# Patient Record
Sex: Female | Born: 1945 | Race: White | Hispanic: No | Marital: Single | Smoking: Never smoker
Health system: Southern US, Community
[De-identification: ages and names within clinical notes are randomized; demographics above are authoritative.]

## PROBLEM LIST (undated history)

## (undated) DIAGNOSIS — Z923 Personal history of irradiation: Secondary | ICD-10-CM

## (undated) DIAGNOSIS — T4145XA Adverse effect of unspecified anesthetic, initial encounter: Secondary | ICD-10-CM

## (undated) DIAGNOSIS — E039 Hypothyroidism, unspecified: Secondary | ICD-10-CM

## (undated) DIAGNOSIS — Z8719 Personal history of other diseases of the digestive system: Secondary | ICD-10-CM

## (undated) DIAGNOSIS — R51 Headache: Secondary | ICD-10-CM

## (undated) DIAGNOSIS — F329 Major depressive disorder, single episode, unspecified: Secondary | ICD-10-CM

## (undated) DIAGNOSIS — Z7901 Long term (current) use of anticoagulants: Secondary | ICD-10-CM

## (undated) DIAGNOSIS — J45909 Unspecified asthma, uncomplicated: Secondary | ICD-10-CM

## (undated) DIAGNOSIS — F419 Anxiety disorder, unspecified: Secondary | ICD-10-CM

## (undated) DIAGNOSIS — Z862 Personal history of diseases of the blood and blood-forming organs and certain disorders involving the immune mechanism: Secondary | ICD-10-CM

## (undated) DIAGNOSIS — M1990 Unspecified osteoarthritis, unspecified site: Secondary | ICD-10-CM

## (undated) DIAGNOSIS — I639 Cerebral infarction, unspecified: Secondary | ICD-10-CM

## (undated) DIAGNOSIS — Z86718 Personal history of other venous thrombosis and embolism: Secondary | ICD-10-CM

## (undated) DIAGNOSIS — J189 Pneumonia, unspecified organism: Secondary | ICD-10-CM

## (undated) DIAGNOSIS — E785 Hyperlipidemia, unspecified: Secondary | ICD-10-CM

## (undated) DIAGNOSIS — K759 Inflammatory liver disease, unspecified: Secondary | ICD-10-CM

## (undated) DIAGNOSIS — M797 Fibromyalgia: Secondary | ICD-10-CM

## (undated) DIAGNOSIS — T8859XA Other complications of anesthesia, initial encounter: Secondary | ICD-10-CM

## (undated) DIAGNOSIS — J9819 Other pulmonary collapse: Secondary | ICD-10-CM

## (undated) DIAGNOSIS — F32A Depression, unspecified: Secondary | ICD-10-CM

## (undated) DIAGNOSIS — G709 Myoneural disorder, unspecified: Secondary | ICD-10-CM

## (undated) DIAGNOSIS — I10 Essential (primary) hypertension: Secondary | ICD-10-CM

## (undated) DIAGNOSIS — M7989 Other specified soft tissue disorders: Principal | ICD-10-CM

## (undated) DIAGNOSIS — K219 Gastro-esophageal reflux disease without esophagitis: Secondary | ICD-10-CM

## (undated) DIAGNOSIS — C50919 Malignant neoplasm of unspecified site of unspecified female breast: Secondary | ICD-10-CM

## (undated) DIAGNOSIS — G030 Nonpyogenic meningitis: Secondary | ICD-10-CM

## (undated) DIAGNOSIS — I742 Embolism and thrombosis of arteries of the upper extremities: Secondary | ICD-10-CM

## (undated) DIAGNOSIS — Z8673 Personal history of transient ischemic attack (TIA), and cerebral infarction without residual deficits: Secondary | ICD-10-CM

## (undated) HISTORY — DX: Personal history of transient ischemic attack (TIA), and cerebral infarction without residual deficits: Z86.73

## (undated) HISTORY — DX: Nonpyogenic meningitis: G03.0

## (undated) HISTORY — PX: ABDOMINAL HYSTERECTOMY: SHX81

## (undated) HISTORY — DX: Embolism and thrombosis of arteries of the upper extremities: I74.2

## (undated) HISTORY — PX: ESOPHAGOGASTRODUODENOSCOPY ENDOSCOPY: SHX5814

## (undated) HISTORY — PX: BREAST SURGERY: SHX581

## (undated) HISTORY — DX: Hypothyroidism, unspecified: E03.9

## (undated) HISTORY — DX: Malignant neoplasm of unspecified site of unspecified female breast: C50.919

## (undated) HISTORY — DX: Personal history of other venous thrombosis and embolism: Z86.718

## (undated) HISTORY — PX: TONSILLECTOMY: SUR1361

## (undated) HISTORY — PX: BUNIONECTOMY: SHX129

## (undated) HISTORY — DX: Personal history of diseases of the blood and blood-forming organs and certain disorders involving the immune mechanism: Z86.2

## (undated) HISTORY — DX: Long term (current) use of anticoagulants: Z79.01

## (undated) HISTORY — DX: Myoneural disorder, unspecified: G70.9

## (undated) HISTORY — PX: KNEE ARTHROSCOPY: SHX127

## (undated) HISTORY — DX: Unspecified osteoarthritis, unspecified site: M19.90

## (undated) HISTORY — DX: Other specified soft tissue disorders: M79.89

## (undated) HISTORY — PX: THYROIDECTOMY: SHX17

## (undated) HISTORY — DX: Hyperlipidemia, unspecified: E78.5

---

## 1978-07-26 HISTORY — PX: RIGHT OOPHORECTOMY: SHX2359

## 1980-07-26 HISTORY — PX: LEFT OOPHORECTOMY: SHX1961

## 1988-07-26 DIAGNOSIS — K759 Inflammatory liver disease, unspecified: Secondary | ICD-10-CM

## 1988-07-26 HISTORY — DX: Inflammatory liver disease, unspecified: K75.9

## 1997-07-12 HISTORY — PX: CARDIAC CATHETERIZATION: SHX172

## 1998-02-11 ENCOUNTER — Ambulatory Visit (HOSPITAL_COMMUNITY): Admission: RE | Admit: 1998-02-11 | Discharge: 1998-02-11 | Payer: Self-pay | Admitting: *Deleted

## 1998-03-03 ENCOUNTER — Ambulatory Visit (HOSPITAL_COMMUNITY): Admission: RE | Admit: 1998-03-03 | Discharge: 1998-03-03 | Payer: Self-pay | Admitting: *Deleted

## 1998-07-08 ENCOUNTER — Ambulatory Visit (HOSPITAL_COMMUNITY): Admission: RE | Admit: 1998-07-08 | Discharge: 1998-07-08 | Payer: Self-pay | Admitting: Obstetrics and Gynecology

## 1998-07-17 ENCOUNTER — Encounter: Payer: Self-pay | Admitting: Obstetrics and Gynecology

## 1998-07-17 ENCOUNTER — Ambulatory Visit (HOSPITAL_COMMUNITY): Admission: RE | Admit: 1998-07-17 | Discharge: 1998-07-17 | Payer: Self-pay | Admitting: Obstetrics and Gynecology

## 1998-07-26 HISTORY — PX: BREAST LUMPECTOMY: SHX2

## 1998-07-30 ENCOUNTER — Ambulatory Visit (HOSPITAL_COMMUNITY): Admission: RE | Admit: 1998-07-30 | Discharge: 1998-07-30 | Payer: Self-pay | Admitting: *Deleted

## 1998-08-14 ENCOUNTER — Ambulatory Visit (HOSPITAL_COMMUNITY): Admission: RE | Admit: 1998-08-14 | Discharge: 1998-08-15 | Payer: Self-pay | Admitting: *Deleted

## 1998-08-18 ENCOUNTER — Encounter: Admission: RE | Admit: 1998-08-18 | Discharge: 1998-11-16 | Payer: Self-pay | Admitting: Radiation Oncology

## 1999-02-24 ENCOUNTER — Ambulatory Visit (HOSPITAL_COMMUNITY): Admission: RE | Admit: 1999-02-24 | Discharge: 1999-02-24 | Payer: Self-pay | Admitting: *Deleted

## 1999-03-05 ENCOUNTER — Ambulatory Visit (HOSPITAL_COMMUNITY): Admission: RE | Admit: 1999-03-05 | Discharge: 1999-03-05 | Payer: Self-pay | Admitting: *Deleted

## 1999-03-05 ENCOUNTER — Encounter (INDEPENDENT_AMBULATORY_CARE_PROVIDER_SITE_OTHER): Payer: Self-pay | Admitting: Specialist

## 1999-04-20 ENCOUNTER — Ambulatory Visit (HOSPITAL_COMMUNITY): Admission: RE | Admit: 1999-04-20 | Discharge: 1999-04-20 | Payer: Self-pay | Admitting: Oncology

## 1999-04-20 ENCOUNTER — Encounter: Payer: Self-pay | Admitting: Oncology

## 1999-06-23 ENCOUNTER — Encounter: Payer: Self-pay | Admitting: Family Medicine

## 1999-06-23 ENCOUNTER — Ambulatory Visit (HOSPITAL_COMMUNITY): Admission: RE | Admit: 1999-06-23 | Discharge: 1999-06-23 | Payer: Self-pay | Admitting: Family Medicine

## 1999-07-13 ENCOUNTER — Ambulatory Visit (HOSPITAL_COMMUNITY): Admission: RE | Admit: 1999-07-13 | Discharge: 1999-07-13 | Payer: Self-pay | Admitting: *Deleted

## 1999-07-13 ENCOUNTER — Encounter (INDEPENDENT_AMBULATORY_CARE_PROVIDER_SITE_OTHER): Payer: Self-pay

## 1999-09-01 ENCOUNTER — Encounter: Payer: Self-pay | Admitting: Oncology

## 1999-09-01 ENCOUNTER — Ambulatory Visit (HOSPITAL_COMMUNITY): Admission: RE | Admit: 1999-09-01 | Discharge: 1999-09-01 | Payer: Self-pay | Admitting: Hematology and Oncology

## 2000-02-26 ENCOUNTER — Encounter: Admission: RE | Admit: 2000-02-26 | Discharge: 2000-02-26 | Payer: Self-pay | Admitting: *Deleted

## 2000-04-17 ENCOUNTER — Inpatient Hospital Stay (HOSPITAL_COMMUNITY): Admission: EM | Admit: 2000-04-17 | Discharge: 2000-04-19 | Payer: Self-pay | Admitting: Emergency Medicine

## 2000-04-17 ENCOUNTER — Encounter: Payer: Self-pay | Admitting: Hematology and Oncology

## 2000-04-18 ENCOUNTER — Encounter: Payer: Self-pay | Admitting: Oncology

## 2000-04-24 ENCOUNTER — Ambulatory Visit (HOSPITAL_COMMUNITY): Admission: RE | Admit: 2000-04-24 | Discharge: 2000-04-24 | Payer: Self-pay | Admitting: Oncology

## 2000-04-29 ENCOUNTER — Encounter (HOSPITAL_COMMUNITY): Admission: RE | Admit: 2000-04-29 | Discharge: 2000-07-28 | Payer: Self-pay | Admitting: Oncology

## 2000-05-11 ENCOUNTER — Encounter: Payer: Self-pay | Admitting: General Surgery

## 2000-05-11 ENCOUNTER — Encounter: Admission: RE | Admit: 2000-05-11 | Discharge: 2000-05-11 | Payer: Self-pay | Admitting: General Surgery

## 2000-05-31 ENCOUNTER — Encounter: Admission: RE | Admit: 2000-05-31 | Discharge: 2000-05-31 | Payer: Self-pay | Admitting: Oncology

## 2000-05-31 ENCOUNTER — Encounter: Payer: Self-pay | Admitting: Oncology

## 2000-05-31 ENCOUNTER — Encounter (INDEPENDENT_AMBULATORY_CARE_PROVIDER_SITE_OTHER): Payer: Self-pay | Admitting: Specialist

## 2000-06-01 ENCOUNTER — Other Ambulatory Visit: Admission: RE | Admit: 2000-06-01 | Discharge: 2000-06-01 | Payer: Self-pay | Admitting: Oncology

## 2000-06-28 ENCOUNTER — Ambulatory Visit (HOSPITAL_COMMUNITY): Admission: RE | Admit: 2000-06-28 | Discharge: 2000-06-28 | Payer: Self-pay | Admitting: *Deleted

## 2000-06-28 ENCOUNTER — Encounter (INDEPENDENT_AMBULATORY_CARE_PROVIDER_SITE_OTHER): Payer: Self-pay | Admitting: Specialist

## 2000-08-02 ENCOUNTER — Encounter: Admission: RE | Admit: 2000-08-02 | Discharge: 2000-10-31 | Payer: Self-pay | Admitting: Oncology

## 2000-08-02 ENCOUNTER — Inpatient Hospital Stay (HOSPITAL_COMMUNITY): Admission: AD | Admit: 2000-08-02 | Discharge: 2000-08-04 | Payer: Self-pay | Admitting: Oncology

## 2000-08-02 ENCOUNTER — Encounter: Payer: Self-pay | Admitting: Oncology

## 2000-08-10 ENCOUNTER — Encounter: Admission: RE | Admit: 2000-08-10 | Discharge: 2000-08-10 | Payer: Self-pay | Admitting: Oncology

## 2000-08-10 ENCOUNTER — Encounter: Payer: Self-pay | Admitting: Oncology

## 2000-09-14 ENCOUNTER — Encounter: Payer: Self-pay | Admitting: Oncology

## 2000-09-14 ENCOUNTER — Encounter: Admission: RE | Admit: 2000-09-14 | Discharge: 2000-09-14 | Payer: Self-pay | Admitting: Oncology

## 2000-09-23 ENCOUNTER — Other Ambulatory Visit: Admission: RE | Admit: 2000-09-23 | Discharge: 2000-09-23 | Payer: Self-pay | Admitting: General Surgery

## 2000-09-24 ENCOUNTER — Emergency Department (HOSPITAL_COMMUNITY): Admission: EM | Admit: 2000-09-24 | Discharge: 2000-09-24 | Payer: Self-pay | Admitting: Emergency Medicine

## 2000-11-10 ENCOUNTER — Encounter (INDEPENDENT_AMBULATORY_CARE_PROVIDER_SITE_OTHER): Payer: Self-pay | Admitting: Specialist

## 2000-11-10 ENCOUNTER — Ambulatory Visit (HOSPITAL_BASED_OUTPATIENT_CLINIC_OR_DEPARTMENT_OTHER): Admission: RE | Admit: 2000-11-10 | Discharge: 2000-11-10 | Payer: Self-pay | Admitting: *Deleted

## 2000-11-13 ENCOUNTER — Ambulatory Visit (HOSPITAL_COMMUNITY): Admission: RE | Admit: 2000-11-13 | Discharge: 2000-11-13 | Payer: Self-pay | Admitting: Oncology

## 2000-12-23 ENCOUNTER — Encounter: Payer: Self-pay | Admitting: Hematology & Oncology

## 2000-12-23 ENCOUNTER — Inpatient Hospital Stay (HOSPITAL_COMMUNITY): Admission: AD | Admit: 2000-12-23 | Discharge: 2000-12-24 | Payer: Self-pay | Admitting: Hematology & Oncology

## 2001-01-11 ENCOUNTER — Ambulatory Visit: Admission: RE | Admit: 2001-01-11 | Discharge: 2001-01-11 | Payer: Self-pay | Admitting: Oncology

## 2001-06-09 ENCOUNTER — Encounter: Admission: RE | Admit: 2001-06-09 | Discharge: 2001-06-09 | Payer: Self-pay | Admitting: *Deleted

## 2001-09-22 ENCOUNTER — Encounter: Admission: RE | Admit: 2001-09-22 | Discharge: 2001-09-22 | Payer: Self-pay | Admitting: *Deleted

## 2002-04-06 ENCOUNTER — Ambulatory Visit (HOSPITAL_COMMUNITY): Admission: RE | Admit: 2002-04-06 | Discharge: 2002-04-06 | Payer: Self-pay | Admitting: *Deleted

## 2002-04-06 ENCOUNTER — Encounter (INDEPENDENT_AMBULATORY_CARE_PROVIDER_SITE_OTHER): Payer: Self-pay

## 2002-05-10 ENCOUNTER — Encounter: Admission: RE | Admit: 2002-05-10 | Discharge: 2002-05-10 | Payer: Self-pay | Admitting: *Deleted

## 2002-06-25 ENCOUNTER — Ambulatory Visit (HOSPITAL_COMMUNITY): Admission: RE | Admit: 2002-06-25 | Discharge: 2002-06-25 | Payer: Self-pay | Admitting: *Deleted

## 2002-06-25 ENCOUNTER — Encounter (INDEPENDENT_AMBULATORY_CARE_PROVIDER_SITE_OTHER): Payer: Self-pay | Admitting: *Deleted

## 2002-08-14 ENCOUNTER — Ambulatory Visit (HOSPITAL_COMMUNITY): Admission: RE | Admit: 2002-08-14 | Discharge: 2002-08-14 | Payer: Self-pay | Admitting: *Deleted

## 2002-10-12 ENCOUNTER — Encounter: Payer: Self-pay | Admitting: Oncology

## 2002-10-12 ENCOUNTER — Encounter: Admission: RE | Admit: 2002-10-12 | Discharge: 2002-10-12 | Payer: Self-pay | Admitting: Oncology

## 2003-05-07 ENCOUNTER — Encounter: Admission: RE | Admit: 2003-05-07 | Discharge: 2003-05-07 | Payer: Self-pay | Admitting: *Deleted

## 2003-10-24 ENCOUNTER — Encounter: Admission: RE | Admit: 2003-10-24 | Discharge: 2003-10-24 | Payer: Self-pay | Admitting: Oncology

## 2004-04-09 ENCOUNTER — Observation Stay (HOSPITAL_COMMUNITY): Admission: EM | Admit: 2004-04-09 | Discharge: 2004-04-10 | Payer: Self-pay

## 2004-05-26 ENCOUNTER — Ambulatory Visit: Payer: Self-pay | Admitting: Oncology

## 2004-08-04 ENCOUNTER — Ambulatory Visit: Payer: Self-pay | Admitting: Oncology

## 2004-09-11 ENCOUNTER — Encounter (INDEPENDENT_AMBULATORY_CARE_PROVIDER_SITE_OTHER): Payer: Self-pay | Admitting: Specialist

## 2004-09-11 ENCOUNTER — Ambulatory Visit (HOSPITAL_COMMUNITY): Admission: RE | Admit: 2004-09-11 | Discharge: 2004-09-11 | Payer: Self-pay | Admitting: *Deleted

## 2004-09-21 ENCOUNTER — Ambulatory Visit: Payer: Self-pay | Admitting: Oncology

## 2004-11-03 ENCOUNTER — Encounter: Admission: RE | Admit: 2004-11-03 | Discharge: 2004-11-03 | Payer: Self-pay | Admitting: Oncology

## 2004-11-23 ENCOUNTER — Ambulatory Visit: Payer: Self-pay | Admitting: Oncology

## 2005-01-11 ENCOUNTER — Ambulatory Visit: Payer: Self-pay | Admitting: Oncology

## 2005-01-30 ENCOUNTER — Emergency Department (HOSPITAL_COMMUNITY): Admission: EM | Admit: 2005-01-30 | Discharge: 2005-01-30 | Payer: Self-pay | Admitting: Emergency Medicine

## 2005-02-26 ENCOUNTER — Ambulatory Visit: Payer: Self-pay | Admitting: Oncology

## 2005-05-03 ENCOUNTER — Ambulatory Visit: Payer: Self-pay | Admitting: Oncology

## 2005-06-02 ENCOUNTER — Ambulatory Visit: Payer: Self-pay | Admitting: Oncology

## 2005-07-08 ENCOUNTER — Inpatient Hospital Stay (HOSPITAL_COMMUNITY): Admission: AD | Admit: 2005-07-08 | Discharge: 2005-07-09 | Payer: Self-pay | Admitting: Cardiovascular Disease

## 2005-07-22 ENCOUNTER — Ambulatory Visit: Payer: Self-pay | Admitting: Oncology

## 2005-09-21 ENCOUNTER — Ambulatory Visit: Payer: Self-pay | Admitting: Oncology

## 2005-10-25 ENCOUNTER — Encounter: Admission: RE | Admit: 2005-10-25 | Discharge: 2005-10-25 | Payer: Self-pay | Admitting: Oncology

## 2005-11-12 ENCOUNTER — Ambulatory Visit: Payer: Self-pay | Admitting: Oncology

## 2005-11-15 LAB — CBC WITH DIFFERENTIAL/PLATELET
BASO%: 1.3 % (ref 0.0–2.0)
Basophils Absolute: 0.1 10*3/uL (ref 0.0–0.1)
EOS%: 1.7 % (ref 0.0–7.0)
Eosinophils Absolute: 0.1 10*3/uL (ref 0.0–0.5)
HCT: 40.2 % (ref 34.8–46.6)
HGB: 14.2 g/dL (ref 11.6–15.9)
LYMPH%: 26.2 % (ref 14.0–48.0)
MCH: 31.7 pg (ref 26.0–34.0)
MCHC: 35.5 g/dL (ref 32.0–36.0)
MCV: 89.4 fL (ref 81.0–101.0)
MONO#: 0.5 10*3/uL (ref 0.1–0.9)
MONO%: 6.7 % (ref 0.0–13.0)
NEUT#: 4.8 10*3/uL (ref 1.5–6.5)
NEUT%: 64.2 % (ref 39.6–76.8)
Platelets: 253 10*3/uL (ref 145–400)
RBC: 4.49 10*6/uL (ref 3.70–5.32)
RDW: 12.3 % (ref 11.3–14.5)
WBC: 7.4 10*3/uL (ref 3.9–10.0)
lymph#: 2 10*3/uL (ref 0.9–3.3)

## 2005-11-15 LAB — PROTIME-INR
INR: 3.1 (ref 2.00–3.50)
Protime: 21.1 Seconds (ref 10.6–13.4)

## 2005-11-24 ENCOUNTER — Ambulatory Visit (HOSPITAL_COMMUNITY): Admission: RE | Admit: 2005-11-24 | Discharge: 2005-11-24 | Payer: Self-pay | Admitting: Oncology

## 2005-11-29 ENCOUNTER — Encounter: Admission: RE | Admit: 2005-11-29 | Discharge: 2005-11-29 | Payer: Self-pay | Admitting: Oncology

## 2005-12-10 ENCOUNTER — Emergency Department (HOSPITAL_COMMUNITY): Admission: EM | Admit: 2005-12-10 | Discharge: 2005-12-10 | Payer: Self-pay | Admitting: Emergency Medicine

## 2005-12-14 LAB — CBC WITH DIFFERENTIAL/PLATELET
BASO%: 1.1 % (ref 0.0–2.0)
Basophils Absolute: 0.1 10*3/uL (ref 0.0–0.1)
EOS%: 2.3 % (ref 0.0–7.0)
Eosinophils Absolute: 0.2 10*3/uL (ref 0.0–0.5)
HCT: 38.1 % (ref 34.8–46.6)
HGB: 13.6 g/dL (ref 11.6–15.9)
LYMPH%: 27.4 % (ref 14.0–48.0)
MCH: 32 pg (ref 26.0–34.0)
MCHC: 35.7 g/dL (ref 32.0–36.0)
MCV: 89.6 fL (ref 81.0–101.0)
MONO#: 0.5 10*3/uL (ref 0.1–0.9)
MONO%: 6.8 % (ref 0.0–13.0)
NEUT#: 4.8 10*3/uL (ref 1.5–6.5)
NEUT%: 62.4 % (ref 39.6–76.8)
Platelets: 279 10*3/uL (ref 145–400)
RBC: 4.25 10*6/uL (ref 3.70–5.32)
RDW: 12.7 % (ref 11.3–14.5)
WBC: 7.6 10*3/uL (ref 3.9–10.0)
lymph#: 2.1 10*3/uL (ref 0.9–3.3)

## 2005-12-14 LAB — PROTIME-INR
INR: 4.6 — ABNORMAL HIGH (ref 2.00–3.50)
Protime: 26 Seconds — ABNORMAL HIGH (ref 10.6–13.4)

## 2005-12-17 LAB — PROTIME-INR
INR: 1.4 — ABNORMAL LOW (ref 2.00–3.50)
Protime: 14.8 Seconds — ABNORMAL HIGH (ref 10.6–13.4)

## 2005-12-24 ENCOUNTER — Emergency Department (HOSPITAL_COMMUNITY): Admission: EM | Admit: 2005-12-24 | Discharge: 2005-12-24 | Payer: Self-pay | Admitting: Emergency Medicine

## 2005-12-28 ENCOUNTER — Ambulatory Visit: Payer: Self-pay | Admitting: Oncology

## 2005-12-28 LAB — PROTIME-INR
INR: 3.5 (ref 2.00–3.50)
Protime: 22.6 Seconds — ABNORMAL HIGH (ref 10.6–13.4)

## 2005-12-29 LAB — PROTIME-INR
INR: 2.1 (ref 2.00–3.50)
Protime: 17.9 Seconds — ABNORMAL HIGH (ref 10.6–13.4)

## 2005-12-30 ENCOUNTER — Ambulatory Visit (HOSPITAL_BASED_OUTPATIENT_CLINIC_OR_DEPARTMENT_OTHER): Admission: RE | Admit: 2005-12-30 | Discharge: 2005-12-30 | Payer: Self-pay | Admitting: Orthopedic Surgery

## 2005-12-31 LAB — PROTIME-INR
INR: 1.2 — ABNORMAL LOW (ref 2.00–3.50)
Protime: 13.4 Seconds (ref 10.6–13.4)

## 2006-01-03 LAB — PROTIME-INR
INR: 1.8 — ABNORMAL LOW (ref 2.00–3.50)
Protime: 16.5 Seconds — ABNORMAL HIGH (ref 10.6–13.4)

## 2006-01-11 LAB — CBC WITH DIFFERENTIAL/PLATELET
BASO%: 1.5 % (ref 0.0–2.0)
Basophils Absolute: 0.1 10*3/uL (ref 0.0–0.1)
EOS%: 2.5 % (ref 0.0–7.0)
Eosinophils Absolute: 0.2 10*3/uL (ref 0.0–0.5)
HCT: 35.1 % (ref 34.8–46.6)
HGB: 12.2 g/dL (ref 11.6–15.9)
LYMPH%: 23.8 % (ref 14.0–48.0)
MCH: 31.9 pg (ref 26.0–34.0)
MCHC: 34.8 g/dL (ref 32.0–36.0)
MCV: 91.7 fL (ref 81.0–101.0)
MONO#: 0.5 10*3/uL (ref 0.1–0.9)
MONO%: 5.4 % (ref 0.0–13.0)
NEUT#: 5.9 10*3/uL (ref 1.5–6.5)
NEUT%: 66.8 % (ref 39.6–76.8)
Platelets: 361 10*3/uL (ref 145–400)
RBC: 3.83 10*6/uL (ref 3.70–5.32)
RDW: 12.6 % (ref 11.3–14.5)
WBC: 8.8 10*3/uL (ref 3.9–10.0)
lymph#: 2.1 10*3/uL (ref 0.9–3.3)

## 2006-01-11 LAB — PROTIME-INR
INR: 2.9 (ref 2.00–3.50)
Protime: 20.5 Seconds — ABNORMAL HIGH (ref 10.6–13.4)

## 2006-01-25 LAB — PROTIME-INR
INR: 2.4 (ref 2.00–3.50)
Protime: 18.9 Seconds — ABNORMAL HIGH (ref 10.6–13.4)

## 2006-02-07 ENCOUNTER — Ambulatory Visit: Payer: Self-pay | Admitting: Oncology

## 2006-02-07 LAB — CBC WITH DIFFERENTIAL/PLATELET
BASO%: 0.6 % (ref 0.0–2.0)
Basophils Absolute: 0 10*3/uL (ref 0.0–0.1)
EOS%: 3.9 % (ref 0.0–7.0)
Eosinophils Absolute: 0.3 10*3/uL (ref 0.0–0.5)
HCT: 37.5 % (ref 34.8–46.6)
HGB: 13.1 g/dL (ref 11.6–15.9)
LYMPH%: 29.2 % (ref 14.0–48.0)
MCH: 32 pg (ref 26.0–34.0)
MCHC: 34.9 g/dL (ref 32.0–36.0)
MCV: 91.6 fL (ref 81.0–101.0)
MONO#: 0.5 10*3/uL (ref 0.1–0.9)
MONO%: 6.8 % (ref 0.0–13.0)
NEUT#: 4.4 10*3/uL (ref 1.5–6.5)
NEUT%: 59.6 % (ref 39.6–76.8)
Platelets: 246 10*3/uL (ref 145–400)
RBC: 4.1 10*6/uL (ref 3.70–5.32)
RDW: 12.5 % (ref 11.3–14.5)
WBC: 7.4 10*3/uL (ref 3.9–10.0)
lymph#: 2.2 10*3/uL (ref 0.9–3.3)

## 2006-02-07 LAB — PROTIME-INR
INR: 4.6 — ABNORMAL HIGH (ref 2.00–3.50)
Protime: 25.9 Seconds — ABNORMAL HIGH (ref 10.6–13.4)

## 2006-02-14 LAB — PROTIME-INR
INR: 2 (ref 2.00–3.50)
Protime: 17.6 Seconds — ABNORMAL HIGH (ref 10.6–13.4)

## 2006-03-04 LAB — CBC WITH DIFFERENTIAL/PLATELET
BASO%: 1.4 % (ref 0.0–2.0)
Basophils Absolute: 0.1 10*3/uL (ref 0.0–0.1)
EOS%: 4.8 % (ref 0.0–7.0)
Eosinophils Absolute: 0.3 10*3/uL (ref 0.0–0.5)
HCT: 36.6 % (ref 34.8–46.6)
HGB: 12.5 g/dL (ref 11.6–15.9)
LYMPH%: 28.2 % (ref 14.0–48.0)
MCH: 31.7 pg (ref 26.0–34.0)
MCHC: 34.2 g/dL (ref 32.0–36.0)
MCV: 92.6 fL (ref 81.0–101.0)
MONO#: 0.4 10*3/uL (ref 0.1–0.9)
MONO%: 6.2 % (ref 0.0–13.0)
NEUT#: 3.8 10*3/uL (ref 1.5–6.5)
NEUT%: 59.4 % (ref 39.6–76.8)
Platelets: 232 10*3/uL (ref 145–400)
RBC: 3.96 10*6/uL (ref 3.70–5.32)
RDW: 12.7 % (ref 11.3–14.5)
WBC: 6.4 10*3/uL (ref 3.9–10.0)
lymph#: 1.8 10*3/uL (ref 0.9–3.3)

## 2006-03-04 LAB — PROTIME-INR
INR: 2.7 (ref 2.00–3.50)
Protime: 20 Seconds (ref 10.6–13.4)

## 2006-03-14 LAB — CBC WITH DIFFERENTIAL/PLATELET
BASO%: 0.8 % (ref 0.0–2.0)
Basophils Absolute: 0 10*3/uL (ref 0.0–0.1)
EOS%: 1.8 % (ref 0.0–7.0)
Eosinophils Absolute: 0.1 10*3/uL (ref 0.0–0.5)
HCT: 36.7 % (ref 34.8–46.6)
HGB: 12.8 g/dL (ref 11.6–15.9)
LYMPH%: 25.9 % (ref 14.0–48.0)
MCH: 32 pg (ref 26.0–34.0)
MCHC: 34.8 g/dL (ref 32.0–36.0)
MCV: 91.9 fL (ref 81.0–101.0)
MONO#: 0.3 10*3/uL (ref 0.1–0.9)
MONO%: 5.2 % (ref 0.0–13.0)
NEUT#: 4.1 10*3/uL (ref 1.5–6.5)
NEUT%: 66.3 % (ref 39.6–76.8)
Platelets: 243 10*3/uL (ref 145–400)
RBC: 4 10*6/uL (ref 3.70–5.32)
RDW: 13.7 % (ref 11.3–14.5)
WBC: 6.2 10*3/uL (ref 3.9–10.0)
lymph#: 1.6 10*3/uL (ref 0.9–3.3)

## 2006-03-14 LAB — COMPREHENSIVE METABOLIC PANEL
ALT: <8 U/L (ref 0–40)
AST: 16 U/L (ref 0–37)
Albumin: 4.6 g/dL (ref 3.5–5.2)
Alkaline Phosphatase: 86 U/L (ref 39–117)
BUN: 26 mg/dL — ABNORMAL HIGH (ref 6–23)
CO2: 27 mEq/L (ref 19–32)
Calcium: 9.3 mg/dL (ref 8.4–10.5)
Chloride: 100 mEq/L (ref 96–112)
Creatinine, Ser: 1.16 mg/dL (ref 0.40–1.20)
Glucose, Bld: 69 mg/dL — ABNORMAL LOW (ref 70–99)
Potassium: 3.9 mEq/L (ref 3.5–5.3)
Sodium: 138 mEq/L (ref 135–145)
Total Bilirubin: 0.5 mg/dL (ref 0.3–1.2)
Total Protein: 7.2 g/dL (ref 6.0–8.3)

## 2006-03-14 LAB — LACTATE DEHYDROGENASE: LDH: 197 U/L (ref 94–250)

## 2006-03-14 LAB — PROTIME-INR
INR: 3.2 (ref 2.00–3.50)
Protime: 38.4 Seconds — ABNORMAL HIGH (ref 10.6–13.4)

## 2006-04-07 ENCOUNTER — Ambulatory Visit: Payer: Self-pay | Admitting: Oncology

## 2006-04-07 LAB — CBC WITH DIFFERENTIAL/PLATELET
BASO%: 1.4 % (ref 0.0–2.0)
Basophils Absolute: 0.1 10*3/uL (ref 0.0–0.1)
EOS%: 2.9 % (ref 0.0–7.0)
Eosinophils Absolute: 0.2 10*3/uL (ref 0.0–0.5)
HCT: 36.9 % (ref 34.8–46.6)
HGB: 12.9 g/dL (ref 11.6–15.9)
LYMPH%: 25.7 % (ref 14.0–48.0)
MCH: 32 pg (ref 26.0–34.0)
MCHC: 35 g/dL (ref 32.0–36.0)
MCV: 91.4 fL (ref 81.0–101.0)
MONO#: 0.4 10*3/uL (ref 0.1–0.9)
MONO%: 6.4 % (ref 0.0–13.0)
NEUT#: 4.1 10*3/uL (ref 1.5–6.5)
NEUT%: 63.5 % (ref 39.6–76.8)
Platelets: 216 10*3/uL (ref 145–400)
RBC: 4.03 10*6/uL (ref 3.70–5.32)
RDW: 12.4 % (ref 11.3–14.5)
WBC: 6.5 10*3/uL (ref 3.9–10.0)
lymph#: 1.7 10*3/uL (ref 0.9–3.3)

## 2006-04-07 LAB — PROTIME-INR
INR: 3.6 — ABNORMAL HIGH (ref 2.00–3.50)
Protime: 43.2 Seconds — ABNORMAL HIGH (ref 10.6–13.4)

## 2006-05-13 ENCOUNTER — Emergency Department (HOSPITAL_COMMUNITY): Admission: EM | Admit: 2006-05-13 | Discharge: 2006-05-13 | Payer: Self-pay | Admitting: *Deleted

## 2006-05-25 ENCOUNTER — Ambulatory Visit: Payer: Self-pay | Admitting: Oncology

## 2006-05-27 LAB — PROTIME-INR
INR: 4 — ABNORMAL HIGH (ref 2.00–3.50)
Protime: 48 Seconds — ABNORMAL HIGH (ref 10.6–13.4)

## 2006-06-06 LAB — PROTIME-INR
INR: 2 (ref 2.00–3.50)
Protime: 24 Seconds — ABNORMAL HIGH (ref 10.6–13.4)

## 2006-07-04 ENCOUNTER — Ambulatory Visit: Payer: Self-pay | Admitting: Oncology

## 2006-07-04 LAB — CBC WITH DIFFERENTIAL/PLATELET
BASO%: 0.6 % (ref 0.0–2.0)
Basophils Absolute: 0 10*3/uL (ref 0.0–0.1)
EOS%: 2.8 % (ref 0.0–7.0)
Eosinophils Absolute: 0.2 10*3/uL (ref 0.0–0.5)
HCT: 38.5 % (ref 34.8–46.6)
HGB: 13.3 g/dL (ref 11.6–15.9)
LYMPH%: 29.4 % (ref 14.0–48.0)
MCH: 31.9 pg (ref 26.0–34.0)
MCHC: 34.5 g/dL (ref 32.0–36.0)
MCV: 92.3 fL (ref 81.0–101.0)
MONO#: 0.4 10*3/uL (ref 0.1–0.9)
MONO%: 6.4 % (ref 0.0–13.0)
NEUT#: 3.6 10*3/uL (ref 1.5–6.5)
NEUT%: 60.8 % (ref 39.6–76.8)
Platelets: 282 10*3/uL (ref 145–400)
RBC: 4.17 10*6/uL (ref 3.70–5.32)
RDW: 13.1 % (ref 11.3–14.5)
WBC: 5.9 10*3/uL (ref 3.9–10.0)
lymph#: 1.7 10*3/uL (ref 0.9–3.3)

## 2006-07-04 LAB — PROTIME-INR
INR: 2.4 (ref 2.00–3.50)
Protime: 28.8 Seconds — ABNORMAL HIGH (ref 10.6–13.4)

## 2006-08-01 LAB — CBC WITH DIFFERENTIAL/PLATELET
BASO%: 1.7 % (ref 0.0–2.0)
Basophils Absolute: 0.1 10*3/uL (ref 0.0–0.1)
EOS%: 3.3 % (ref 0.0–7.0)
Eosinophils Absolute: 0.2 10*3/uL (ref 0.0–0.5)
HCT: 38.8 % (ref 34.8–46.6)
HGB: 13.5 g/dL (ref 11.6–15.9)
LYMPH%: 28.6 % (ref 14.0–48.0)
MCH: 30.9 pg (ref 26.0–34.0)
MCHC: 34.9 g/dL (ref 32.0–36.0)
MCV: 88.5 fL (ref 81.0–101.0)
MONO#: 0.4 10*3/uL (ref 0.1–0.9)
MONO%: 7.2 % (ref 0.0–13.0)
NEUT#: 3.5 10*3/uL (ref 1.5–6.5)
NEUT%: 59.2 % (ref 39.6–76.8)
Platelets: 273 10*3/uL (ref 145–400)
RBC: 4.38 10*6/uL (ref 3.70–5.32)
RDW: 11.7 % (ref 11.3–14.5)
WBC: 5.8 10*3/uL (ref 3.9–10.0)
lymph#: 1.7 10*3/uL (ref 0.9–3.3)

## 2006-08-01 LAB — PROTIME-INR
INR: 2.5 (ref 2.00–3.50)
Protime: 30 Seconds — ABNORMAL HIGH (ref 10.6–13.4)

## 2006-09-07 ENCOUNTER — Ambulatory Visit: Payer: Self-pay | Admitting: Oncology

## 2006-09-12 ENCOUNTER — Encounter (INDEPENDENT_AMBULATORY_CARE_PROVIDER_SITE_OTHER): Payer: Self-pay | Admitting: Specialist

## 2006-09-12 ENCOUNTER — Ambulatory Visit (HOSPITAL_COMMUNITY): Admission: RE | Admit: 2006-09-12 | Discharge: 2006-09-12 | Payer: Self-pay | Admitting: *Deleted

## 2006-09-12 LAB — COMPREHENSIVE METABOLIC PANEL
ALT: 12 U/L (ref 0–35)
AST: 12 U/L (ref 0–37)
Albumin: 4.5 g/dL (ref 3.5–5.2)
Alkaline Phosphatase: 88 U/L (ref 39–117)
BUN: 17 mg/dL (ref 6–23)
CO2: 27 mEq/L (ref 19–32)
Calcium: 9.1 mg/dL (ref 8.4–10.5)
Chloride: 105 mEq/L (ref 96–112)
Creatinine, Ser: 1.1 mg/dL (ref 0.40–1.20)
Glucose, Bld: 87 mg/dL (ref 70–99)
Potassium: 4.3 mEq/L (ref 3.5–5.3)
Sodium: 141 mEq/L (ref 135–145)
Total Bilirubin: 0.7 mg/dL (ref 0.3–1.2)
Total Protein: 7.1 g/dL (ref 6.0–8.3)

## 2006-09-12 LAB — CBC WITH DIFFERENTIAL/PLATELET
BASO%: 0.3 % (ref 0.0–2.0)
Basophils Absolute: 0 10*3/uL (ref 0.0–0.1)
EOS%: 2.2 % (ref 0.0–7.0)
Eosinophils Absolute: 0.1 10*3/uL (ref 0.0–0.5)
HCT: 38.2 % (ref 34.8–46.6)
HGB: 13.4 g/dL (ref 11.6–15.9)
LYMPH%: 21.2 % (ref 14.0–48.0)
MCH: 31.8 pg (ref 26.0–34.0)
MCHC: 35 g/dL (ref 32.0–36.0)
MCV: 90.9 fL (ref 81.0–101.0)
MONO#: 0.4 10*3/uL (ref 0.1–0.9)
MONO%: 5.9 % (ref 0.0–13.0)
NEUT#: 4.5 10*3/uL (ref 1.5–6.5)
NEUT%: 70.4 % (ref 39.6–76.8)
Platelets: 243 10*3/uL (ref 145–400)
RBC: 4.21 10*6/uL (ref 3.70–5.32)
RDW: 13.2 % (ref 11.3–14.5)
WBC: 6.5 10*3/uL (ref 3.9–10.0)
lymph#: 1.4 10*3/uL (ref 0.9–3.3)

## 2006-09-12 LAB — PROTIME-INR
INR: 1.2 — ABNORMAL LOW (ref 2.00–3.50)
Protime: 14.4 Seconds — ABNORMAL HIGH (ref 10.6–13.4)

## 2006-09-12 LAB — LACTATE DEHYDROGENASE: LDH: 140 U/L (ref 94–250)

## 2006-09-16 LAB — PROTIME-INR
INR: 1.7 — ABNORMAL LOW (ref 2.00–3.50)
Protime: 20.4 Seconds — ABNORMAL HIGH (ref 10.6–13.4)

## 2006-09-19 LAB — PROTIME-INR
INR: 2 (ref 2.00–3.50)
Protime: 24 Seconds — ABNORMAL HIGH (ref 10.6–13.4)

## 2006-10-03 LAB — PROTIME-INR
INR: 2.9 (ref 2.00–3.50)
Protime: 34.8 Seconds — ABNORMAL HIGH (ref 10.6–13.4)

## 2006-10-26 ENCOUNTER — Ambulatory Visit: Payer: Self-pay | Admitting: Oncology

## 2006-10-31 LAB — PROTIME-INR
INR: 3 (ref 2.00–3.50)
Protime: 36 Seconds — ABNORMAL HIGH (ref 10.6–13.4)

## 2006-11-02 ENCOUNTER — Encounter: Admission: RE | Admit: 2006-11-02 | Discharge: 2006-11-02 | Payer: Self-pay | Admitting: Oncology

## 2006-11-29 ENCOUNTER — Observation Stay (HOSPITAL_COMMUNITY): Admission: EM | Admit: 2006-11-29 | Discharge: 2006-11-30 | Payer: Self-pay | Admitting: Emergency Medicine

## 2006-12-01 LAB — PROTIME-INR
INR: 3.1 (ref 2.00–3.50)
Protime: 37.2 Seconds — ABNORMAL HIGH (ref 10.6–13.4)

## 2006-12-08 ENCOUNTER — Ambulatory Visit (HOSPITAL_COMMUNITY): Admission: RE | Admit: 2006-12-08 | Discharge: 2006-12-08 | Payer: Self-pay | Admitting: Cardiovascular Disease

## 2006-12-28 ENCOUNTER — Ambulatory Visit: Payer: Self-pay | Admitting: Oncology

## 2007-01-04 LAB — CBC WITH DIFFERENTIAL/PLATELET
BASO%: 0.3 % (ref 0.0–2.0)
Basophils Absolute: 0 10*3/uL (ref 0.0–0.1)
EOS%: 3.4 % (ref 0.0–7.0)
Eosinophils Absolute: 0.2 10*3/uL (ref 0.0–0.5)
HCT: 35 % (ref 34.8–46.6)
HGB: 12 g/dL (ref 11.6–15.9)
LYMPH%: 24.3 % (ref 14.0–48.0)
MCH: 31 pg (ref 26.0–34.0)
MCHC: 34.3 g/dL (ref 32.0–36.0)
MCV: 90.2 fL (ref 81.0–101.0)
MONO#: 0.3 10*3/uL (ref 0.1–0.9)
MONO%: 6.5 % (ref 0.0–13.0)
NEUT#: 3.4 10*3/uL (ref 1.5–6.5)
NEUT%: 65.5 % (ref 39.6–76.8)
Platelets: 207 10*3/uL (ref 145–400)
RBC: 3.88 10*6/uL (ref 3.70–5.32)
RDW: 14.5 % (ref 11.3–14.5)
WBC: 5.3 10*3/uL (ref 3.9–10.0)
lymph#: 1.3 10*3/uL (ref 0.9–3.3)

## 2007-01-04 LAB — PROTIME-INR

## 2007-01-04 LAB — PROTHROMBIN TIME
INR: 3.7 — ABNORMAL HIGH (ref 0.0–1.5)
Prothrombin Time: 38.8 seconds — ABNORMAL HIGH (ref 11.6–15.2)

## 2007-01-23 LAB — PROTIME-INR
INR: 1.8 — ABNORMAL LOW (ref 2.00–3.50)
Protime: 21.6 Seconds — ABNORMAL HIGH (ref 10.6–13.4)

## 2007-02-06 LAB — PROTIME-INR
INR: 1.8 — ABNORMAL LOW (ref 2.00–3.50)
Protime: 21.6 Seconds — ABNORMAL HIGH (ref 10.6–13.4)

## 2007-02-23 ENCOUNTER — Ambulatory Visit: Payer: Self-pay | Admitting: Oncology

## 2007-02-28 LAB — PROTIME-INR
INR: 2.1 (ref 2.00–3.50)
Protime: 25.2 Seconds — ABNORMAL HIGH (ref 10.6–13.4)

## 2007-03-28 ENCOUNTER — Ambulatory Visit: Payer: Self-pay | Admitting: Oncology

## 2007-03-28 LAB — CBC WITH DIFFERENTIAL/PLATELET
BASO%: 1 % (ref 0.0–2.0)
Basophils Absolute: 0.1 10*3/uL (ref 0.0–0.1)
EOS%: 2.9 % (ref 0.0–7.0)
Eosinophils Absolute: 0.2 10*3/uL (ref 0.0–0.5)
HCT: 37.9 % (ref 34.8–46.6)
HGB: 13.5 g/dL (ref 11.6–15.9)
LYMPH%: 24.1 % (ref 14.0–48.0)
MCH: 31.6 pg (ref 26.0–34.0)
MCHC: 35.6 g/dL (ref 32.0–36.0)
MCV: 88.9 fL (ref 81.0–101.0)
MONO#: 0.4 10*3/uL (ref 0.1–0.9)
MONO%: 4.8 % (ref 0.0–13.0)
NEUT#: 5 10*3/uL (ref 1.5–6.5)
NEUT%: 67.2 % (ref 39.6–76.8)
Platelets: 265 10*3/uL (ref 145–400)
RBC: 4.27 10*6/uL (ref 3.70–5.32)
RDW: 14.3 % (ref 11.3–14.5)
WBC: 7.5 10*3/uL (ref 3.9–10.0)
lymph#: 1.8 10*3/uL (ref 0.9–3.3)

## 2007-03-28 LAB — COMPREHENSIVE METABOLIC PANEL
ALT: 13 U/L (ref 0–35)
AST: 17 U/L (ref 0–37)
Albumin: 4.5 g/dL (ref 3.5–5.2)
Alkaline Phosphatase: 95 U/L (ref 39–117)
BUN: 17 mg/dL (ref 6–23)
CO2: 24 mEq/L (ref 19–32)
Calcium: 9.3 mg/dL (ref 8.4–10.5)
Chloride: 102 mEq/L (ref 96–112)
Creatinine, Ser: 1.05 mg/dL (ref 0.40–1.20)
Glucose, Bld: 78 mg/dL (ref 70–99)
Potassium: 4.6 mEq/L (ref 3.5–5.3)
Sodium: 138 mEq/L (ref 135–145)
Total Bilirubin: 0.5 mg/dL (ref 0.3–1.2)
Total Protein: 7.5 g/dL (ref 6.0–8.3)

## 2007-03-28 LAB — PROTIME-INR
INR: 2.3 (ref 2.00–3.50)
Protime: 27.6 Seconds — ABNORMAL HIGH (ref 10.6–13.4)

## 2007-03-28 LAB — MORPHOLOGY
PLT EST: ADEQUATE
RBC Comments: NORMAL

## 2007-03-28 LAB — ERYTHROCYTE SEDIMENTATION RATE: Sed Rate: 19 mm/hr (ref 0–30)

## 2007-03-28 LAB — LACTATE DEHYDROGENASE: LDH: 194 U/L (ref 94–250)

## 2007-04-28 LAB — PROTIME-INR
INR: 1.7 — ABNORMAL LOW (ref 2.00–3.50)
Protime: 20.4 Seconds — ABNORMAL HIGH (ref 10.6–13.4)

## 2007-04-28 LAB — CBC WITH DIFFERENTIAL/PLATELET
BASO%: 1 % (ref 0.0–2.0)
Basophils Absolute: 0.1 10*3/uL (ref 0.0–0.1)
EOS%: 1.7 % (ref 0.0–7.0)
Eosinophils Absolute: 0.1 10*3/uL (ref 0.0–0.5)
HCT: 39.1 % (ref 34.8–46.6)
HGB: 13.5 g/dL (ref 11.6–15.9)
LYMPH%: 30.3 % (ref 14.0–48.0)
MCH: 31.2 pg (ref 26.0–34.0)
MCHC: 34.6 g/dL (ref 32.0–36.0)
MCV: 90.3 fL (ref 81.0–101.0)
MONO#: 0.4 10*3/uL (ref 0.1–0.9)
MONO%: 6 % (ref 0.0–13.0)
NEUT#: 4.4 10*3/uL (ref 1.5–6.5)
NEUT%: 61 % (ref 39.6–76.8)
Platelets: 232 10*3/uL (ref 145–400)
RBC: 4.33 10*6/uL (ref 3.70–5.32)
RDW: 12.2 % (ref 11.3–14.5)
WBC: 7.2 10*3/uL (ref 3.9–10.0)
lymph#: 2.2 10*3/uL (ref 0.9–3.3)

## 2007-04-29 ENCOUNTER — Emergency Department (HOSPITAL_COMMUNITY): Admission: EM | Admit: 2007-04-29 | Discharge: 2007-04-29 | Payer: Self-pay | Admitting: Emergency Medicine

## 2007-05-08 LAB — CBC WITH DIFFERENTIAL/PLATELET
BASO%: 1 % (ref 0.0–2.0)
Basophils Absolute: 0.1 10*3/uL (ref 0.0–0.1)
EOS%: 2.6 % (ref 0.0–7.0)
Eosinophils Absolute: 0.2 10*3/uL (ref 0.0–0.5)
HCT: 35.7 % (ref 34.8–46.6)
HGB: 12.6 g/dL (ref 11.6–15.9)
LYMPH%: 28.4 % (ref 14.0–48.0)
MCH: 31.6 pg (ref 26.0–34.0)
MCHC: 35.3 g/dL (ref 32.0–36.0)
MCV: 89.4 fL (ref 81.0–101.0)
MONO#: 0.4 10*3/uL (ref 0.1–0.9)
MONO%: 6.1 % (ref 0.0–13.0)
NEUT#: 3.8 10*3/uL (ref 1.5–6.5)
NEUT%: 61.8 % (ref 39.6–76.8)
Platelets: 226 10*3/uL (ref 145–400)
RBC: 3.99 10*6/uL (ref 3.70–5.32)
RDW: 12 % (ref 11.3–14.5)
WBC: 6.1 10*3/uL (ref 3.9–10.0)
lymph#: 1.7 10*3/uL (ref 0.9–3.3)

## 2007-05-08 LAB — PROTIME-INR
INR: 1.8 — ABNORMAL LOW (ref 2.00–3.50)
Protime: 21.6 Seconds — ABNORMAL HIGH (ref 10.6–13.4)

## 2007-05-15 ENCOUNTER — Ambulatory Visit: Payer: Self-pay | Admitting: Oncology

## 2007-05-15 LAB — PROTIME-INR
INR: 3.3 (ref 2.00–3.50)
Protime: 39.6 Seconds — ABNORMAL HIGH (ref 10.6–13.4)

## 2007-05-17 ENCOUNTER — Encounter: Admission: RE | Admit: 2007-05-17 | Discharge: 2007-05-17 | Payer: Self-pay | Admitting: Otolaryngology

## 2007-05-18 LAB — PROTIME-INR
INR: 3 (ref 2.00–3.50)
Protime: 36 Seconds — ABNORMAL HIGH (ref 10.6–13.4)

## 2007-05-22 LAB — PROTIME-INR
INR: 3.5 (ref 2.00–3.50)
Protime: 42 Seconds — ABNORMAL HIGH (ref 10.6–13.4)

## 2007-06-07 LAB — PROTIME-INR
INR: 2.9 (ref 2.00–3.50)
Protime: 34.8 Seconds — ABNORMAL HIGH (ref 10.6–13.4)

## 2007-06-21 LAB — PROTIME-INR
INR: 2.5 (ref 2.00–3.50)
Protime: 30 Seconds — ABNORMAL HIGH (ref 10.6–13.4)

## 2007-07-17 ENCOUNTER — Ambulatory Visit: Payer: Self-pay | Admitting: Oncology

## 2007-07-21 LAB — CBC WITH DIFFERENTIAL/PLATELET
BASO%: 1.4 % (ref 0.0–2.0)
Basophils Absolute: 0.1 10*3/uL (ref 0.0–0.1)
EOS%: 1.3 % (ref 0.0–7.0)
Eosinophils Absolute: 0.1 10*3/uL (ref 0.0–0.5)
HCT: 40.6 % (ref 34.8–46.6)
HGB: 14.2 g/dL (ref 11.6–15.9)
LYMPH%: 21.3 % (ref 14.0–48.0)
MCH: 31.6 pg (ref 26.0–34.0)
MCHC: 35 g/dL (ref 32.0–36.0)
MCV: 90.2 fL (ref 81.0–101.0)
MONO#: 0.4 10*3/uL (ref 0.1–0.9)
MONO%: 5.4 % (ref 0.0–13.0)
NEUT#: 5.3 10*3/uL (ref 1.5–6.5)
NEUT%: 70.6 % (ref 39.6–76.8)
Platelets: 261 10*3/uL (ref 145–400)
RBC: 4.5 10*6/uL (ref 3.70–5.32)
RDW: 11.5 % (ref 11.3–14.5)
WBC: 7.5 10*3/uL (ref 3.9–10.0)
lymph#: 1.6 10*3/uL (ref 0.9–3.3)

## 2007-07-21 LAB — PROTIME-INR
INR: 2.2 (ref 2.00–3.50)
Protime: 26.4 Seconds — ABNORMAL HIGH (ref 10.6–13.4)

## 2007-08-02 ENCOUNTER — Emergency Department (HOSPITAL_COMMUNITY): Admission: EM | Admit: 2007-08-02 | Discharge: 2007-08-03 | Payer: Self-pay | Admitting: Emergency Medicine

## 2007-08-07 LAB — CBC WITH DIFFERENTIAL/PLATELET
BASO%: 1.6 % (ref 0.0–2.0)
Basophils Absolute: 0.1 10*3/uL (ref 0.0–0.1)
EOS%: 2 % (ref 0.0–7.0)
Eosinophils Absolute: 0.1 10*3/uL (ref 0.0–0.5)
HCT: 38.3 % (ref 34.8–46.6)
HGB: 13.5 g/dL (ref 11.6–15.9)
LYMPH%: 23.5 % (ref 14.0–48.0)
MCH: 31.3 pg (ref 26.0–34.0)
MCHC: 35.2 g/dL (ref 32.0–36.0)
MCV: 88.9 fL (ref 81.0–101.0)
MONO#: 0.5 10*3/uL (ref 0.1–0.9)
MONO%: 6.5 % (ref 0.0–13.0)
NEUT#: 5 10*3/uL (ref 1.5–6.5)
NEUT%: 66.5 % (ref 39.6–76.8)
Platelets: 261 10*3/uL (ref 145–400)
RBC: 4.31 10*6/uL (ref 3.70–5.32)
RDW: 11.1 % — ABNORMAL LOW (ref 11.3–14.5)
WBC: 7.5 10*3/uL (ref 3.9–10.0)
lymph#: 1.8 10*3/uL (ref 0.9–3.3)

## 2007-08-07 LAB — PROTIME-INR
INR: 3.3 (ref 2.00–3.50)
Protime: 39.6 Seconds — ABNORMAL HIGH (ref 10.6–13.4)

## 2007-08-22 LAB — CBC WITH DIFFERENTIAL/PLATELET
BASO%: 0.8 % (ref 0.0–2.0)
Basophils Absolute: 0.1 10*3/uL (ref 0.0–0.1)
EOS%: 1.1 % (ref 0.0–7.0)
Eosinophils Absolute: 0.1 10*3/uL (ref 0.0–0.5)
HCT: 39.5 % (ref 34.8–46.6)
HGB: 14.3 g/dL (ref 11.6–15.9)
LYMPH%: 17.3 % (ref 14.0–48.0)
MCH: 31.9 pg (ref 26.0–34.0)
MCHC: 36.1 g/dL — ABNORMAL HIGH (ref 32.0–36.0)
MCV: 88.3 fL (ref 81.0–101.0)
MONO#: 0.6 10*3/uL (ref 0.1–0.9)
MONO%: 5.5 % (ref 0.0–13.0)
NEUT#: 7.8 10*3/uL — ABNORMAL HIGH (ref 1.5–6.5)
NEUT%: 75.4 % (ref 39.6–76.8)
Platelets: 166 10*3/uL (ref 145–400)
RBC: 4.47 10*6/uL (ref 3.70–5.32)
RDW: 11.2 % — ABNORMAL LOW (ref 11.3–14.5)
WBC: 10.3 10*3/uL — ABNORMAL HIGH (ref 3.9–10.0)
lymph#: 1.8 10*3/uL (ref 0.9–3.3)

## 2007-08-22 LAB — PROTIME-INR
INR: 4.1 — ABNORMAL HIGH (ref 2.00–3.50)
Protime: 49.2 Seconds — ABNORMAL HIGH (ref 10.6–13.4)

## 2007-08-28 LAB — PROTIME-INR
INR: 2.6 (ref 2.00–3.50)
Protime: 31.2 Seconds — ABNORMAL HIGH (ref 10.6–13.4)

## 2007-09-07 ENCOUNTER — Ambulatory Visit: Payer: Self-pay | Admitting: Oncology

## 2007-09-11 LAB — PROTIME-INR
INR: 3.9 — ABNORMAL HIGH (ref 2.00–3.50)
Protime: 46.8 Seconds — ABNORMAL HIGH (ref 10.6–13.4)

## 2007-09-18 LAB — PROTIME-INR
INR: 2.9 (ref 2.00–3.50)
Protime: 34.8 Seconds — ABNORMAL HIGH (ref 10.6–13.4)

## 2007-09-28 LAB — SEDIMENTATION RATE: Sed Rate: 18 mm/hr (ref 0–22)

## 2007-09-28 LAB — CBC WITH DIFFERENTIAL/PLATELET
BASO%: 0.5 % (ref 0.0–2.0)
Basophils Absolute: 0 10*3/uL (ref 0.0–0.1)
EOS%: 2.6 % (ref 0.0–7.0)
Eosinophils Absolute: 0.2 10*3/uL (ref 0.0–0.5)
HCT: 38.5 % (ref 34.8–46.6)
HGB: 13.5 g/dL (ref 11.6–15.9)
LYMPH%: 27.2 % (ref 14.0–48.0)
MCH: 31.9 pg (ref 26.0–34.0)
MCHC: 35 g/dL (ref 32.0–36.0)
MCV: 91.2 fL (ref 81.0–101.0)
MONO#: 0.2 10*3/uL (ref 0.1–0.9)
MONO%: 3.2 % (ref 0.0–13.0)
NEUT#: 4.6 10*3/uL (ref 1.5–6.5)
NEUT%: 66.5 % (ref 39.6–76.8)
Platelets: 263 10*3/uL (ref 145–400)
RBC: 4.23 10*6/uL (ref 3.70–5.32)
RDW: 14 % (ref 11.3–14.5)
WBC: 6.9 10*3/uL (ref 3.9–10.0)
lymph#: 1.9 10*3/uL (ref 0.9–3.3)

## 2007-09-28 LAB — PROTIME-INR
INR: 3 (ref 2.00–3.50)
Protime: 36 Seconds — ABNORMAL HIGH (ref 10.6–13.4)

## 2007-09-29 LAB — COMPREHENSIVE METABOLIC PANEL
ALT: 15 U/L (ref 0–35)
AST: 15 U/L (ref 0–37)
Albumin: 4.5 g/dL (ref 3.5–5.2)
Alkaline Phosphatase: 83 U/L (ref 39–117)
BUN: 13 mg/dL (ref 6–23)
CO2: 25 mEq/L (ref 19–32)
Calcium: 9.1 mg/dL (ref 8.4–10.5)
Chloride: 104 mEq/L (ref 96–112)
Creatinine, Ser: 0.99 mg/dL (ref 0.40–1.20)
Glucose, Bld: 110 mg/dL — ABNORMAL HIGH (ref 70–99)
Potassium: 4.1 mEq/L (ref 3.5–5.3)
Sodium: 140 mEq/L (ref 135–145)
Total Bilirubin: 0.5 mg/dL (ref 0.3–1.2)
Total Protein: 7 g/dL (ref 6.0–8.3)

## 2007-09-29 LAB — LACTATE DEHYDROGENASE: LDH: 153 U/L (ref 94–250)

## 2007-09-29 LAB — ANA: Anti Nuclear Antibody(ANA): NEGATIVE

## 2007-10-19 ENCOUNTER — Ambulatory Visit: Payer: Self-pay | Admitting: Oncology

## 2007-10-31 LAB — CBC WITH DIFFERENTIAL/PLATELET
BASO%: 1 % (ref 0.0–2.0)
Basophils Absolute: 0.1 10*3/uL (ref 0.0–0.1)
EOS%: 2.3 % (ref 0.0–7.0)
Eosinophils Absolute: 0.2 10*3/uL (ref 0.0–0.5)
HCT: 40.2 % (ref 34.8–46.6)
HGB: 13.6 g/dL (ref 11.6–15.9)
LYMPH%: 23.3 % (ref 14.0–48.0)
MCH: 31.7 pg (ref 26.0–34.0)
MCHC: 33.8 g/dL (ref 32.0–36.0)
MCV: 93.6 fL (ref 81.0–101.0)
MONO#: 0.6 10*3/uL (ref 0.1–0.9)
MONO%: 7.8 % (ref 0.0–13.0)
NEUT#: 5.3 10*3/uL (ref 1.5–6.5)
NEUT%: 65.8 % (ref 39.6–76.8)
Platelets: 229 10*3/uL (ref 145–400)
RBC: 4.3 10*6/uL (ref 3.70–5.32)
RDW: 12.7 % (ref 11.3–14.5)
WBC: 8 10*3/uL (ref 3.9–10.0)
lymph#: 1.9 10*3/uL (ref 0.9–3.3)

## 2007-10-31 LAB — PROTIME-INR
INR: 3.6 — ABNORMAL HIGH (ref 2.00–3.50)
Protime: 43.2 Seconds — ABNORMAL HIGH (ref 10.6–13.4)

## 2007-11-03 ENCOUNTER — Encounter: Admission: RE | Admit: 2007-11-03 | Discharge: 2007-11-03 | Payer: Self-pay | Admitting: Oncology

## 2007-12-08 ENCOUNTER — Ambulatory Visit: Payer: Self-pay | Admitting: Oncology

## 2007-12-26 LAB — PROTIME-INR
INR: 3.2 (ref 2.00–3.50)
Protime: 38.4 Seconds — ABNORMAL HIGH (ref 10.6–13.4)

## 2008-01-02 ENCOUNTER — Observation Stay (HOSPITAL_COMMUNITY): Admission: EM | Admit: 2008-01-02 | Discharge: 2008-01-03 | Payer: Self-pay | Admitting: Emergency Medicine

## 2008-01-19 ENCOUNTER — Ambulatory Visit: Payer: Self-pay | Admitting: Oncology

## 2008-01-23 LAB — PROTHROMBIN TIME
INR: 4.1 — ABNORMAL HIGH (ref 0.0–1.5)
Prothrombin Time: 41.3 seconds — ABNORMAL HIGH (ref 11.6–15.2)

## 2008-01-23 LAB — PROTIME-INR

## 2008-01-29 LAB — PROTIME-INR
INR: 1.7 — ABNORMAL LOW (ref 2.00–3.50)
Protime: 20.4 Seconds — ABNORMAL HIGH (ref 10.6–13.4)

## 2008-02-05 LAB — CBC WITH DIFFERENTIAL/PLATELET
BASO%: 0.8 % (ref 0.0–2.0)
Basophils Absolute: 0.1 10*3/uL (ref 0.0–0.1)
EOS%: 1.4 % (ref 0.0–7.0)
Eosinophils Absolute: 0.1 10*3/uL (ref 0.0–0.5)
HCT: 37.9 % (ref 34.8–46.6)
HGB: 13.1 g/dL (ref 11.6–15.9)
LYMPH%: 25.4 % (ref 14.0–48.0)
MCH: 31.4 pg (ref 26.0–34.0)
MCHC: 34.6 g/dL (ref 32.0–36.0)
MCV: 90.8 fL (ref 81.0–101.0)
MONO#: 0.4 10*3/uL (ref 0.1–0.9)
MONO%: 5.7 % (ref 0.0–13.0)
NEUT#: 4.5 10*3/uL (ref 1.5–6.5)
NEUT%: 66.7 % (ref 39.6–76.8)
Platelets: 248 10*3/uL (ref 145–400)
RBC: 4.18 10*6/uL (ref 3.70–5.32)
RDW: 13.4 % (ref 11.3–14.5)
WBC: 6.8 10*3/uL (ref 3.9–10.0)
lymph#: 1.7 10*3/uL (ref 0.9–3.3)

## 2008-02-05 LAB — PROTIME-INR
INR: 3 (ref 2.00–3.50)
Protime: 36 Seconds — ABNORMAL HIGH (ref 10.6–13.4)

## 2008-02-19 LAB — PROTIME-INR
INR: 1.9 — ABNORMAL LOW (ref 2.00–3.50)
Protime: 22.8 Seconds — ABNORMAL HIGH (ref 10.6–13.4)

## 2008-02-28 LAB — PROTIME-INR
INR: 1.5 — ABNORMAL LOW (ref 2.00–3.50)
Protime: 18 Seconds — ABNORMAL HIGH (ref 10.6–13.4)

## 2008-03-01 LAB — PROTIME-INR
INR: 1.5 — ABNORMAL LOW (ref 2.00–3.50)
Protime: 18 Seconds — ABNORMAL HIGH (ref 10.6–13.4)

## 2008-03-04 LAB — CBC WITH DIFFERENTIAL/PLATELET
BASO%: 1.2 % (ref 0.0–2.0)
Basophils Absolute: 0.1 10*3/uL (ref 0.0–0.1)
EOS%: 3.4 % (ref 0.0–7.0)
Eosinophils Absolute: 0.2 10*3/uL (ref 0.0–0.5)
HCT: 36.5 % (ref 34.8–46.6)
HGB: 12.9 g/dL (ref 11.6–15.9)
LYMPH%: 30.2 % (ref 14.0–48.0)
MCH: 31.6 pg (ref 26.0–34.0)
MCHC: 35.3 g/dL (ref 32.0–36.0)
MCV: 89.7 fL (ref 81.0–101.0)
MONO#: 0.4 10*3/uL (ref 0.1–0.9)
MONO%: 7.7 % (ref 0.0–13.0)
NEUT#: 3.3 10*3/uL (ref 1.5–6.5)
NEUT%: 57.5 % (ref 39.6–76.8)
Platelets: 206 10*3/uL (ref 145–400)
RBC: 4.07 10*6/uL (ref 3.70–5.32)
RDW: 12.2 % (ref 11.3–14.5)
WBC: 5.8 10*3/uL (ref 3.9–10.0)
lymph#: 1.8 10*3/uL (ref 0.9–3.3)

## 2008-03-04 LAB — PROTIME-INR
INR: 2.2 (ref 2.00–3.50)
Protime: 26.4 Seconds — ABNORMAL HIGH (ref 10.6–13.4)

## 2008-03-18 ENCOUNTER — Ambulatory Visit: Payer: Self-pay | Admitting: Oncology

## 2008-03-20 LAB — CBC WITH DIFFERENTIAL/PLATELET
BASO%: 1 % (ref 0.0–2.0)
Basophils Absolute: 0.1 10*3/uL (ref 0.0–0.1)
EOS%: 2.2 % (ref 0.0–7.0)
Eosinophils Absolute: 0.2 10*3/uL (ref 0.0–0.5)
HCT: 40.1 % (ref 34.8–46.6)
HGB: 13.7 g/dL (ref 11.6–15.9)
LYMPH%: 22.4 % (ref 14.0–48.0)
MCH: 31 pg (ref 26.0–34.0)
MCHC: 34.2 g/dL (ref 32.0–36.0)
MCV: 90.6 fL (ref 81.0–101.0)
MONO#: 0.6 10*3/uL (ref 0.1–0.9)
MONO%: 6.9 % (ref 0.0–13.0)
NEUT#: 5.5 10*3/uL (ref 1.5–6.5)
NEUT%: 67.5 % (ref 39.6–76.8)
Platelets: 243 10*3/uL (ref 145–400)
RBC: 4.42 10*6/uL (ref 3.70–5.32)
RDW: 12.6 % (ref 11.3–14.5)
WBC: 8.2 10*3/uL (ref 3.9–10.0)
lymph#: 1.8 10*3/uL (ref 0.9–3.3)

## 2008-03-20 LAB — PROTIME-INR
INR: 2.8 (ref 2.00–3.50)
Protime: 33.6 Seconds — ABNORMAL HIGH (ref 10.6–13.4)

## 2008-04-19 LAB — PROTIME-INR
INR: 4.9 — ABNORMAL HIGH (ref 2.00–3.50)
Protime: 58.8 Seconds — ABNORMAL HIGH (ref 10.6–13.4)

## 2008-04-22 LAB — CBC WITH DIFFERENTIAL/PLATELET
BASO%: 0.5 % (ref 0.0–2.0)
Basophils Absolute: 0 10*3/uL (ref 0.0–0.1)
EOS%: 2.3 % (ref 0.0–7.0)
Eosinophils Absolute: 0.2 10*3/uL (ref 0.0–0.5)
HCT: 38.3 % (ref 34.8–46.6)
HGB: 13.3 g/dL (ref 11.6–15.9)
LYMPH%: 25.5 % (ref 14.0–48.0)
MCH: 31.8 pg (ref 26.0–34.0)
MCHC: 34.7 g/dL (ref 32.0–36.0)
MCV: 91.8 fL (ref 81.0–101.0)
MONO#: 0.5 10*3/uL (ref 0.1–0.9)
MONO%: 7.1 % (ref 0.0–13.0)
NEUT#: 4.5 10*3/uL (ref 1.5–6.5)
NEUT%: 64.6 % (ref 39.6–76.8)
Platelets: 235 10*3/uL (ref 145–400)
RBC: 4.17 10*6/uL (ref 3.70–5.32)
RDW: 13.6 % (ref 11.3–14.5)
WBC: 7 10*3/uL (ref 3.9–10.0)
lymph#: 1.8 10*3/uL (ref 0.9–3.3)

## 2008-04-22 LAB — PROTIME-INR
INR: 1.1 — ABNORMAL LOW (ref 2.00–3.50)
Protime: 13.2 Seconds (ref 10.6–13.4)

## 2008-04-29 LAB — CBC WITH DIFFERENTIAL/PLATELET
BASO%: 1.1 % (ref 0.0–2.0)
Basophils Absolute: 0.1 10*3/uL (ref 0.0–0.1)
EOS%: 1.9 % (ref 0.0–7.0)
Eosinophils Absolute: 0.1 10*3/uL (ref 0.0–0.5)
HCT: 37 % (ref 34.8–46.6)
HGB: 12.6 g/dL (ref 11.6–15.9)
LYMPH%: 25.6 % (ref 14.0–48.0)
MCH: 30.5 pg (ref 26.0–34.0)
MCHC: 33.9 g/dL (ref 32.0–36.0)
MCV: 89.8 fL (ref 81.0–101.0)
MONO#: 0.4 10*3/uL (ref 0.1–0.9)
MONO%: 6 % (ref 0.0–13.0)
NEUT#: 4.6 10*3/uL (ref 1.5–6.5)
NEUT%: 65.4 % (ref 39.6–76.8)
Platelets: 249 10*3/uL (ref 145–400)
RBC: 4.12 10*6/uL (ref 3.70–5.32)
RDW: 13.2 % (ref 11.3–14.5)
WBC: 7 10*3/uL (ref 3.9–10.0)
lymph#: 1.8 10*3/uL (ref 0.9–3.3)

## 2008-04-29 LAB — PROTIME-INR
INR: 2.4 (ref 2.00–3.50)
Protime: 28.8 Seconds — ABNORMAL HIGH (ref 10.6–13.4)

## 2008-05-21 ENCOUNTER — Ambulatory Visit: Payer: Self-pay | Admitting: Oncology

## 2008-05-23 LAB — PROTIME-INR
INR: 2.4 (ref 2.00–3.50)
Protime: 28.8 Seconds — ABNORMAL HIGH (ref 10.6–13.4)

## 2008-06-19 LAB — PROTIME-INR
INR: 2.5 (ref 2.00–3.50)
Protime: 30 Seconds — ABNORMAL HIGH (ref 10.6–13.4)

## 2008-07-16 ENCOUNTER — Ambulatory Visit: Payer: Self-pay | Admitting: Oncology

## 2008-07-22 LAB — PROTIME-INR
INR: 3.7 — ABNORMAL HIGH (ref 2.00–3.50)
Protime: 44.4 Seconds — ABNORMAL HIGH (ref 10.6–13.4)

## 2008-08-05 LAB — PROTIME-INR
INR: 3.6 — ABNORMAL HIGH (ref 2.00–3.50)
Protime: 43.2 Seconds — ABNORMAL HIGH (ref 10.6–13.4)

## 2008-09-17 ENCOUNTER — Ambulatory Visit: Payer: Self-pay | Admitting: Oncology

## 2008-09-19 LAB — PROTIME-INR
INR: 2.6 (ref 2.00–3.50)
Protime: 31.2 Seconds — ABNORMAL HIGH (ref 10.6–13.4)

## 2008-10-29 ENCOUNTER — Ambulatory Visit: Payer: Self-pay | Admitting: Oncology

## 2008-10-31 LAB — COMPREHENSIVE METABOLIC PANEL
ALT: 15 U/L (ref 0–35)
AST: 16 U/L (ref 0–37)
Albumin: 4.5 g/dL (ref 3.5–5.2)
Alkaline Phosphatase: 99 U/L (ref 39–117)
BUN: 21 mg/dL (ref 6–23)
CO2: 25 mEq/L (ref 19–32)
Calcium: 9.5 mg/dL (ref 8.4–10.5)
Chloride: 102 mEq/L (ref 96–112)
Creatinine, Ser: 1.2 mg/dL (ref 0.40–1.20)
Glucose, Bld: 98 mg/dL (ref 70–99)
Potassium: 4 mEq/L (ref 3.5–5.3)
Sodium: 137 mEq/L (ref 135–145)
Total Bilirubin: 0.7 mg/dL (ref 0.3–1.2)
Total Protein: 6.9 g/dL (ref 6.0–8.3)

## 2008-10-31 LAB — CBC WITH DIFFERENTIAL/PLATELET
BASO%: 0.4 % (ref 0.0–2.0)
Basophils Absolute: 0 10*3/uL (ref 0.0–0.1)
EOS%: 1.9 % (ref 0.0–7.0)
Eosinophils Absolute: 0.1 10*3/uL (ref 0.0–0.5)
HCT: 38.8 % (ref 34.8–46.6)
HGB: 13.3 g/dL (ref 11.6–15.9)
LYMPH%: 27 % (ref 14.0–49.7)
MCH: 31 pg (ref 25.1–34.0)
MCHC: 34.2 g/dL (ref 31.5–36.0)
MCV: 90.6 fL (ref 79.5–101.0)
MONO#: 0.3 10*3/uL (ref 0.1–0.9)
MONO%: 5.4 % (ref 0.0–14.0)
NEUT#: 4.2 10*3/uL (ref 1.5–6.5)
NEUT%: 65.3 % (ref 38.4–76.8)
Platelets: 214 10*3/uL (ref 145–400)
RBC: 4.28 10*6/uL (ref 3.70–5.45)
RDW: 13.8 % (ref 11.2–14.5)
WBC: 6.4 10*3/uL (ref 3.9–10.3)
lymph#: 1.7 10*3/uL (ref 0.9–3.3)

## 2008-10-31 LAB — PROTIME-INR
INR: 1.5 — ABNORMAL LOW (ref 2.00–3.50)
Protime: 18 Seconds — ABNORMAL HIGH (ref 10.6–13.4)

## 2008-10-31 LAB — LACTATE DEHYDROGENASE: LDH: 166 U/L (ref 94–250)

## 2008-10-31 LAB — ERYTHROCYTE SEDIMENTATION RATE: Sed Rate: 15 mm/hr (ref 0–30)

## 2008-11-04 ENCOUNTER — Encounter: Admission: RE | Admit: 2008-11-04 | Discharge: 2008-11-04 | Payer: Self-pay | Admitting: Oncology

## 2008-11-04 LAB — PROTIME-INR
INR: 2.7 (ref 2.00–3.50)
Protime: 32.4 Seconds — ABNORMAL HIGH (ref 10.6–13.4)

## 2008-11-08 LAB — PROTIME-INR
INR: 3.3 (ref 2.00–3.50)
Protime: 39.6 Seconds — ABNORMAL HIGH (ref 10.6–13.4)

## 2008-11-18 LAB — PROTIME-INR
INR: 3.7 — ABNORMAL HIGH (ref 2.00–3.50)
Protime: 44.4 s — ABNORMAL HIGH (ref 10.6–13.4)

## 2008-11-20 LAB — VITAMIN B12: Vitamin B-12: 298 pg/mL (ref 211–911)

## 2008-11-20 LAB — LEAD, BLOOD: Lead-Whole Blood: 6.2

## 2008-11-24 ENCOUNTER — Observation Stay (HOSPITAL_COMMUNITY): Admission: EM | Admit: 2008-11-24 | Discharge: 2008-11-26 | Payer: Self-pay | Admitting: Emergency Medicine

## 2008-11-25 ENCOUNTER — Ambulatory Visit: Payer: Self-pay | Admitting: Vascular Surgery

## 2008-11-25 ENCOUNTER — Encounter (INDEPENDENT_AMBULATORY_CARE_PROVIDER_SITE_OTHER): Payer: Self-pay | Admitting: *Deleted

## 2008-12-03 LAB — PROTIME-INR
INR: 4.2 — ABNORMAL HIGH (ref 2.00–3.50)
Protime: 50.4 Seconds — ABNORMAL HIGH (ref 10.6–13.4)

## 2008-12-06 LAB — CBC WITH DIFFERENTIAL/PLATELET
BASO%: 0.4 % (ref 0.0–2.0)
Basophils Absolute: 0 10*3/uL (ref 0.0–0.1)
EOS%: 2.3 % (ref 0.0–7.0)
Eosinophils Absolute: 0.2 10*3/uL (ref 0.0–0.5)
HCT: 38.2 % (ref 34.8–46.6)
HGB: 13.1 g/dL (ref 11.6–15.9)
LYMPH%: 25.9 % (ref 14.0–49.7)
MCH: 31 pg (ref 25.1–34.0)
MCHC: 34.4 g/dL (ref 31.5–36.0)
MCV: 90.3 fL (ref 79.5–101.0)
MONO#: 0.4 10*3/uL (ref 0.1–0.9)
MONO%: 5.8 % (ref 0.0–14.0)
NEUT#: 4.5 10*3/uL (ref 1.5–6.5)
NEUT%: 65.6 % (ref 38.4–76.8)
Platelets: 215 10*3/uL (ref 145–400)
RBC: 4.23 10*6/uL (ref 3.70–5.45)
RDW: 14.3 % (ref 11.2–14.5)
WBC: 6.8 10*3/uL (ref 3.9–10.3)
lymph#: 1.8 10*3/uL (ref 0.9–3.3)

## 2008-12-06 LAB — PROTIME-INR
INR: 2 (ref 2.00–3.50)
Protime: 24 Seconds — ABNORMAL HIGH (ref 10.6–13.4)

## 2008-12-09 ENCOUNTER — Ambulatory Visit: Payer: Self-pay | Admitting: Oncology

## 2008-12-11 LAB — PROTIME-INR
INR: 3.6 — ABNORMAL HIGH (ref 2.00–3.50)
Protime: 43.2 Seconds — ABNORMAL HIGH (ref 10.6–13.4)

## 2008-12-17 LAB — PROTIME-INR
INR: 2.9 (ref 2.00–3.50)
Protime: 34.8 Seconds — ABNORMAL HIGH (ref 10.6–13.4)

## 2008-12-30 LAB — PROTIME-INR
INR: 2.7 (ref 2.00–3.50)
Protime: 32.4 Seconds — ABNORMAL HIGH (ref 10.6–13.4)

## 2009-01-22 ENCOUNTER — Ambulatory Visit: Payer: Self-pay | Admitting: Oncology

## 2009-01-24 LAB — PROTIME-INR
INR: 2.3 (ref 2.00–3.50)
Protime: 27.6 Seconds — ABNORMAL HIGH (ref 10.6–13.4)

## 2009-02-21 ENCOUNTER — Ambulatory Visit: Payer: Self-pay | Admitting: Oncology

## 2009-02-21 LAB — PROTIME-INR
INR: 3 (ref 2.00–3.50)
Protime: 36 Seconds — ABNORMAL HIGH (ref 10.6–13.4)

## 2009-03-26 ENCOUNTER — Ambulatory Visit: Payer: Self-pay | Admitting: Oncology

## 2009-03-28 LAB — CBC WITH DIFFERENTIAL/PLATELET
BASO%: 0.6 % (ref 0.0–2.0)
Basophils Absolute: 0 10*3/uL (ref 0.0–0.1)
EOS%: 2.2 % (ref 0.0–7.0)
Eosinophils Absolute: 0.2 10*3/uL (ref 0.0–0.5)
HCT: 37.4 % (ref 34.8–46.6)
HGB: 12.7 g/dL (ref 11.6–15.9)
LYMPH%: 29.4 % (ref 14.0–49.7)
MCH: 30.8 pg (ref 25.1–34.0)
MCHC: 34 g/dL (ref 31.5–36.0)
MCV: 90.8 fL (ref 79.5–101.0)
MONO#: 0.5 10*3/uL (ref 0.1–0.9)
MONO%: 7 % (ref 0.0–14.0)
NEUT#: 4.1 10*3/uL (ref 1.5–6.5)
NEUT%: 60.8 % (ref 38.4–76.8)
Platelets: 190 10*3/uL (ref 145–400)
RBC: 4.12 10*6/uL (ref 3.70–5.45)
RDW: 13.7 % (ref 11.2–14.5)
WBC: 6.7 10*3/uL (ref 3.9–10.3)
lymph#: 2 10*3/uL (ref 0.9–3.3)

## 2009-03-28 LAB — PROTIME-INR
INR: 3.4 (ref 2.00–3.50)
Protime: 40.8 Seconds — ABNORMAL HIGH (ref 10.6–13.4)

## 2009-05-02 ENCOUNTER — Ambulatory Visit: Payer: Self-pay | Admitting: Oncology

## 2009-05-06 LAB — PROTIME-INR
INR: 3.1 (ref 2.00–3.50)
Protime: 37.2 Seconds — ABNORMAL HIGH (ref 10.6–13.4)

## 2009-05-13 ENCOUNTER — Encounter: Admission: RE | Admit: 2009-05-13 | Discharge: 2009-05-13 | Payer: Self-pay | Admitting: Orthopedic Surgery

## 2009-05-30 ENCOUNTER — Ambulatory Visit: Payer: Self-pay | Admitting: Oncology

## 2009-06-03 LAB — PROTIME-INR
INR: 2.3 (ref 2.00–3.50)
Protime: 27.6 Seconds — ABNORMAL HIGH (ref 10.6–13.4)

## 2009-06-09 ENCOUNTER — Encounter: Admission: RE | Admit: 2009-06-09 | Discharge: 2009-07-24 | Payer: Self-pay | Admitting: Orthopedic Surgery

## 2009-07-11 ENCOUNTER — Ambulatory Visit: Payer: Self-pay | Admitting: Oncology

## 2009-07-15 LAB — PROTIME-INR
INR: 4.7 — ABNORMAL HIGH (ref 2.00–3.50)
Protime: 56.4 Seconds — ABNORMAL HIGH (ref 10.6–13.4)

## 2009-07-21 ENCOUNTER — Emergency Department (HOSPITAL_COMMUNITY): Admission: EM | Admit: 2009-07-21 | Discharge: 2009-07-21 | Payer: Self-pay | Admitting: Emergency Medicine

## 2009-07-21 LAB — PROTIME-INR
INR: 2.4 (ref 2.00–3.50)
Protime: 28.8 Seconds — ABNORMAL HIGH (ref 10.6–13.4)

## 2009-07-22 ENCOUNTER — Encounter: Admission: RE | Admit: 2009-07-22 | Discharge: 2009-07-22 | Payer: Self-pay | Admitting: Family Medicine

## 2009-07-29 ENCOUNTER — Encounter: Admission: RE | Admit: 2009-07-29 | Discharge: 2009-10-27 | Payer: Self-pay | Admitting: Orthopedic Surgery

## 2009-08-04 LAB — PROTIME-INR
INR: 4.2 — ABNORMAL HIGH (ref 2.00–3.50)
Protime: 50.4 Seconds — ABNORMAL HIGH (ref 10.6–13.4)

## 2009-08-05 ENCOUNTER — Ambulatory Visit: Payer: Self-pay | Admitting: Oncology

## 2009-08-07 LAB — PROTIME-INR
INR: 2.6 (ref 2.00–3.50)
Protime: 31.2 Seconds — ABNORMAL HIGH (ref 10.6–13.4)

## 2009-08-12 LAB — PROTIME-INR
INR: 2.5 (ref 2.00–3.50)
Protime: 30 s — ABNORMAL HIGH (ref 10.6–13.4)

## 2009-08-27 LAB — CBC WITH DIFFERENTIAL/PLATELET
BASO%: 0.8 % (ref 0.0–2.0)
Basophils Absolute: 0.1 10*3/uL (ref 0.0–0.1)
EOS%: 3 % (ref 0.0–7.0)
Eosinophils Absolute: 0.2 10*3/uL (ref 0.0–0.5)
HCT: 41.7 % (ref 34.8–46.6)
HGB: 13.7 g/dL (ref 11.6–15.9)
LYMPH%: 34.7 % (ref 14.0–49.7)
MCH: 30.6 pg (ref 25.1–34.0)
MCHC: 32.9 g/dL (ref 31.5–36.0)
MCV: 93.3 fL (ref 79.5–101.0)
MONO#: 0.4 10*3/uL (ref 0.1–0.9)
MONO%: 6.3 % (ref 0.0–14.0)
NEUT#: 3.5 10*3/uL (ref 1.5–6.5)
NEUT%: 55.2 % (ref 38.4–76.8)
Platelets: 203 10*3/uL (ref 145–400)
RBC: 4.47 10*6/uL (ref 3.70–5.45)
RDW: 13.2 % (ref 11.2–14.5)
WBC: 6.3 10*3/uL (ref 3.9–10.3)
lymph#: 2.2 10*3/uL (ref 0.9–3.3)

## 2009-08-27 LAB — PROTIME-INR
INR: 2 (ref 2.00–3.50)
Protime: 24 Seconds — ABNORMAL HIGH (ref 10.6–13.4)

## 2009-09-15 ENCOUNTER — Ambulatory Visit: Payer: Self-pay | Admitting: Oncology

## 2009-09-17 LAB — PROTIME-INR
INR: 2.6 (ref 2.00–3.50)
Protime: 31.2 Seconds — ABNORMAL HIGH (ref 10.6–13.4)

## 2009-10-13 LAB — PROTIME-INR
INR: 2.7 (ref 2.00–3.50)
Protime: 32.4 Seconds — ABNORMAL HIGH (ref 10.6–13.4)

## 2009-10-20 ENCOUNTER — Ambulatory Visit: Payer: Self-pay | Admitting: Oncology

## 2009-10-22 LAB — PROTIME-INR
INR: 1.2 — ABNORMAL LOW (ref 2.00–3.50)
Protime: 14.4 Seconds — ABNORMAL HIGH (ref 10.6–13.4)

## 2009-10-27 LAB — PROTIME-INR
INR: 1.6 — ABNORMAL LOW (ref 2.00–3.50)
Protime: 19.2 Seconds — ABNORMAL HIGH (ref 10.6–13.4)

## 2009-10-27 LAB — CBC WITH DIFFERENTIAL/PLATELET
BASO%: 1 % (ref 0.0–2.0)
Basophils Absolute: 0.1 10*3/uL (ref 0.0–0.1)
EOS%: 3.5 % (ref 0.0–7.0)
Eosinophils Absolute: 0.2 10*3/uL (ref 0.0–0.5)
HCT: 42.7 % (ref 34.8–46.6)
HGB: 14.5 g/dL (ref 11.6–15.9)
LYMPH%: 33.1 % (ref 14.0–49.7)
MCH: 31.3 pg (ref 25.1–34.0)
MCHC: 34 g/dL (ref 31.5–36.0)
MCV: 92.2 fL (ref 79.5–101.0)
MONO#: 0.4 10*3/uL (ref 0.1–0.9)
MONO%: 6.7 % (ref 0.0–14.0)
NEUT#: 3.4 10*3/uL (ref 1.5–6.5)
NEUT%: 55.7 % (ref 38.4–76.8)
Platelets: 207 10*3/uL (ref 145–400)
RBC: 4.63 10*6/uL (ref 3.70–5.45)
RDW: 13.9 % (ref 11.2–14.5)
WBC: 6 10*3/uL (ref 3.9–10.3)
lymph#: 2 10*3/uL (ref 0.9–3.3)
nRBC: 0 % (ref 0–0)

## 2009-10-31 LAB — PROTIME-INR
INR: 1.9 — ABNORMAL LOW (ref 2.00–3.50)
Protime: 22.8 Seconds — ABNORMAL HIGH (ref 10.6–13.4)

## 2009-11-04 LAB — COMPREHENSIVE METABOLIC PANEL
ALT: 38 U/L — ABNORMAL HIGH (ref 0–35)
AST: 29 U/L (ref 0–37)
Albumin: 4.6 g/dL (ref 3.5–5.2)
Alkaline Phosphatase: 82 U/L (ref 39–117)
BUN: 19 mg/dL (ref 6–23)
CO2: 26 mEq/L (ref 19–32)
Calcium: 9.3 mg/dL (ref 8.4–10.5)
Chloride: 102 mEq/L (ref 96–112)
Creatinine, Ser: 1.17 mg/dL (ref 0.40–1.20)
Glucose, Bld: 77 mg/dL (ref 70–99)
Potassium: 4.7 mEq/L (ref 3.5–5.3)
Sodium: 139 mEq/L (ref 135–145)
Total Bilirubin: 0.5 mg/dL (ref 0.3–1.2)
Total Protein: 6.9 g/dL (ref 6.0–8.3)

## 2009-11-04 LAB — CBC WITH DIFFERENTIAL/PLATELET
BASO%: 0.4 % (ref 0.0–2.0)
Basophils Absolute: 0 10*3/uL (ref 0.0–0.1)
EOS%: 2.1 % (ref 0.0–7.0)
Eosinophils Absolute: 0.1 10*3/uL (ref 0.0–0.5)
HCT: 39.8 % (ref 34.8–46.6)
HGB: 13.5 g/dL (ref 11.6–15.9)
LYMPH%: 26.7 % (ref 14.0–49.7)
MCH: 31.7 pg (ref 25.1–34.0)
MCHC: 33.9 g/dL (ref 31.5–36.0)
MCV: 93.6 fL (ref 79.5–101.0)
MONO#: 0.2 10*3/uL (ref 0.1–0.9)
MONO%: 4 % (ref 0.0–14.0)
NEUT#: 4.1 10*3/uL (ref 1.5–6.5)
NEUT%: 66.8 % (ref 38.4–76.8)
Platelets: 219 10*3/uL (ref 145–400)
RBC: 4.25 10*6/uL (ref 3.70–5.45)
RDW: 14.4 % (ref 11.2–14.5)
WBC: 6.2 10*3/uL (ref 3.9–10.3)
lymph#: 1.6 10*3/uL (ref 0.9–3.3)

## 2009-11-04 LAB — PROTIME-INR
INR: 2 (ref 2.00–3.50)
Protime: 24 Seconds — ABNORMAL HIGH (ref 10.6–13.4)

## 2009-11-04 LAB — SEDIMENTATION RATE: Sed Rate: 11 mm/hr (ref 0–22)

## 2009-11-04 LAB — LACTATE DEHYDROGENASE: LDH: 153 U/L (ref 94–250)

## 2009-11-10 LAB — PROTIME-INR
INR: 2.5 (ref 2.00–3.50)
Protime: 30 Seconds — ABNORMAL HIGH (ref 10.6–13.4)

## 2009-11-27 ENCOUNTER — Ambulatory Visit: Payer: Self-pay | Admitting: Oncology

## 2009-11-27 LAB — PROTIME-INR
INR: 2.2 (ref 2.00–3.50)
Protime: 26.4 Seconds — ABNORMAL HIGH (ref 10.6–13.4)

## 2009-12-03 ENCOUNTER — Encounter: Admission: RE | Admit: 2009-12-03 | Discharge: 2009-12-03 | Payer: Self-pay | Admitting: Oncology

## 2009-12-12 LAB — CBC WITH DIFFERENTIAL/PLATELET
BASO%: 0.6 % (ref 0.0–2.0)
Basophils Absolute: 0 10*3/uL (ref 0.0–0.1)
EOS%: 2 % (ref 0.0–7.0)
Eosinophils Absolute: 0.1 10*3/uL (ref 0.0–0.5)
HCT: 39.6 % (ref 34.8–46.6)
HGB: 13.6 g/dL (ref 11.6–15.9)
LYMPH%: 37 % (ref 14.0–49.7)
MCH: 31.8 pg (ref 25.1–34.0)
MCHC: 34.3 g/dL (ref 31.5–36.0)
MCV: 92.5 fL (ref 79.5–101.0)
MONO#: 0.5 10*3/uL (ref 0.1–0.9)
MONO%: 7.6 % (ref 0.0–14.0)
NEUT#: 3.7 10*3/uL (ref 1.5–6.5)
NEUT%: 52.8 % (ref 38.4–76.8)
Platelets: 254 10*3/uL (ref 145–400)
RBC: 4.28 10*6/uL (ref 3.70–5.45)
RDW: 14.2 % (ref 11.2–14.5)
WBC: 7 10*3/uL (ref 3.9–10.3)
lymph#: 2.6 10*3/uL (ref 0.9–3.3)

## 2009-12-12 LAB — PROTIME-INR
INR: 2.9 (ref 2.00–3.50)
Protime: 34.8 Seconds — ABNORMAL HIGH (ref 10.6–13.4)

## 2010-01-20 ENCOUNTER — Ambulatory Visit: Payer: Self-pay | Admitting: Oncology

## 2010-01-22 LAB — PROTIME-INR
INR: 1.9 — ABNORMAL LOW (ref 2.00–3.50)
Protime: 22.8 Seconds — ABNORMAL HIGH (ref 10.6–13.4)

## 2010-02-16 ENCOUNTER — Emergency Department (HOSPITAL_COMMUNITY): Admission: EM | Admit: 2010-02-16 | Discharge: 2010-02-16 | Payer: Self-pay | Admitting: Emergency Medicine

## 2010-02-19 ENCOUNTER — Ambulatory Visit: Payer: Self-pay | Admitting: Oncology

## 2010-02-19 LAB — PROTIME-INR
INR: 2.9 (ref 2.00–3.50)
Protime: 34.8 Seconds — ABNORMAL HIGH (ref 10.6–13.4)

## 2010-03-18 LAB — PROTIME-INR
INR: 1.4 — ABNORMAL LOW (ref 2.00–3.50)
Protime: 16.8 Seconds — ABNORMAL HIGH (ref 10.6–13.4)

## 2010-03-23 ENCOUNTER — Ambulatory Visit: Payer: Self-pay | Admitting: Oncology

## 2010-03-23 LAB — PROTIME-INR
INR: 1.4 — ABNORMAL LOW (ref 2.00–3.50)
Protime: 16.8 Seconds — ABNORMAL HIGH (ref 10.6–13.4)

## 2010-03-27 LAB — PROTIME-INR
INR: 1.8 — ABNORMAL LOW (ref 2.00–3.50)
Protime: 21.6 Seconds — ABNORMAL HIGH (ref 10.6–13.4)

## 2010-03-31 LAB — PROTIME-INR
INR: 2 (ref 2.00–3.50)
Protime: 24 Seconds — ABNORMAL HIGH (ref 10.6–13.4)

## 2010-04-06 LAB — PROTIME-INR
INR: 2.2 (ref 2.00–3.50)
Protime: 26.4 Seconds — ABNORMAL HIGH (ref 10.6–13.4)

## 2010-04-21 LAB — PROTIME-INR
INR: 2.9 (ref 2.00–3.50)
Protime: 34.8 Seconds — ABNORMAL HIGH (ref 10.6–13.4)

## 2010-05-01 ENCOUNTER — Ambulatory Visit: Payer: Self-pay | Admitting: Oncology

## 2010-05-05 LAB — CBC WITH DIFFERENTIAL/PLATELET
BASO%: 0.3 % (ref 0.0–2.0)
Basophils Absolute: 0 10*3/uL (ref 0.0–0.1)
EOS%: 1.5 % (ref 0.0–7.0)
Eosinophils Absolute: 0.1 10*3/uL (ref 0.0–0.5)
HCT: 37.7 % (ref 34.8–46.6)
HGB: 13.2 g/dL (ref 11.6–15.9)
LYMPH%: 23.4 % (ref 14.0–49.7)
MCH: 32.9 pg (ref 25.1–34.0)
MCHC: 35 g/dL (ref 31.5–36.0)
MCV: 94 fL (ref 79.5–101.0)
MONO#: 0.4 10*3/uL (ref 0.1–0.9)
MONO%: 5.8 % (ref 0.0–14.0)
NEUT#: 4.7 10*3/uL (ref 1.5–6.5)
NEUT%: 69 % (ref 38.4–76.8)
Platelets: 218 10*3/uL (ref 145–400)
RBC: 4.01 10*6/uL (ref 3.70–5.45)
RDW: 13.8 % (ref 11.2–14.5)
WBC: 6.8 10*3/uL (ref 3.9–10.3)
lymph#: 1.6 10*3/uL (ref 0.9–3.3)

## 2010-05-05 LAB — COMPREHENSIVE METABOLIC PANEL
ALT: 17 U/L (ref 0–35)
AST: 18 U/L (ref 0–37)
Albumin: 4.3 g/dL (ref 3.5–5.2)
Alkaline Phosphatase: 78 U/L (ref 39–117)
BUN: 14 mg/dL (ref 6–23)
CO2: 29 mEq/L (ref 19–32)
Calcium: 9.9 mg/dL (ref 8.4–10.5)
Chloride: 103 mEq/L (ref 96–112)
Creatinine, Ser: 1.12 mg/dL (ref 0.40–1.20)
Glucose, Bld: 112 mg/dL — ABNORMAL HIGH (ref 70–99)
Potassium: 4.3 mEq/L (ref 3.5–5.3)
Sodium: 140 mEq/L (ref 135–145)
Total Bilirubin: 0.5 mg/dL (ref 0.3–1.2)
Total Protein: 6.6 g/dL (ref 6.0–8.3)

## 2010-05-05 LAB — PROTIME-INR
INR: 3.8 — ABNORMAL HIGH (ref 2.00–3.50)
Protime: 45.6 Seconds — ABNORMAL HIGH (ref 10.6–13.4)

## 2010-05-05 LAB — LACTATE DEHYDROGENASE: LDH: 175 U/L (ref 94–250)

## 2010-05-12 LAB — PROTIME-INR
INR: 3.6 — ABNORMAL HIGH (ref 2.00–3.50)
Protime: 43.2 Seconds — ABNORMAL HIGH (ref 10.6–13.4)

## 2010-05-19 LAB — PROTIME-INR
INR: 2.6 (ref 2.00–3.50)
Protime: 31.2 Seconds — ABNORMAL HIGH (ref 10.6–13.4)

## 2010-06-05 ENCOUNTER — Ambulatory Visit: Payer: Self-pay | Admitting: Oncology

## 2010-06-09 LAB — PROTIME-INR
INR: 3.3 (ref 2.00–3.50)
Protime: 39.6 Seconds — ABNORMAL HIGH (ref 10.6–13.4)

## 2010-06-23 LAB — PROTIME-INR
INR: 2.4 (ref 2.00–3.50)
Protime: 28.8 Seconds — ABNORMAL HIGH (ref 10.6–13.4)

## 2010-07-13 ENCOUNTER — Ambulatory Visit (HOSPITAL_BASED_OUTPATIENT_CLINIC_OR_DEPARTMENT_OTHER): Payer: Medicare Other | Admitting: Oncology

## 2010-07-15 LAB — PROTIME-INR
INR: 3.3 (ref 2.00–3.50)
Protime: 39.6 Seconds — ABNORMAL HIGH (ref 10.6–13.4)

## 2010-07-22 LAB — PROTIME-INR
INR: 3.1 (ref 2.00–3.50)
Protime: 37.2 Seconds — ABNORMAL HIGH (ref 10.6–13.4)

## 2010-08-05 LAB — PROTIME-INR
INR: 2.7 (ref 2.00–3.50)
Protime: 32.4 Seconds — ABNORMAL HIGH (ref 10.6–13.4)

## 2010-08-16 ENCOUNTER — Encounter: Payer: Self-pay | Admitting: Oncology

## 2010-08-29 IMAGING — MG MM SCREEN MAMMOGRAM BILATERAL
4 series · 4 of 4 positions shown · non-contrast
Comparison: none

DG SCREEN MAMMOGRAM BILATERAL
Bilateral CC and MLO view(s) were taken.

DIGITAL SCREENING MAMMOGRAM WITH CAD:
There are scattered fibroglandular densities.  No masses or malignant type calcifications are 
identified.  Compared with prior studies.

[R CC]
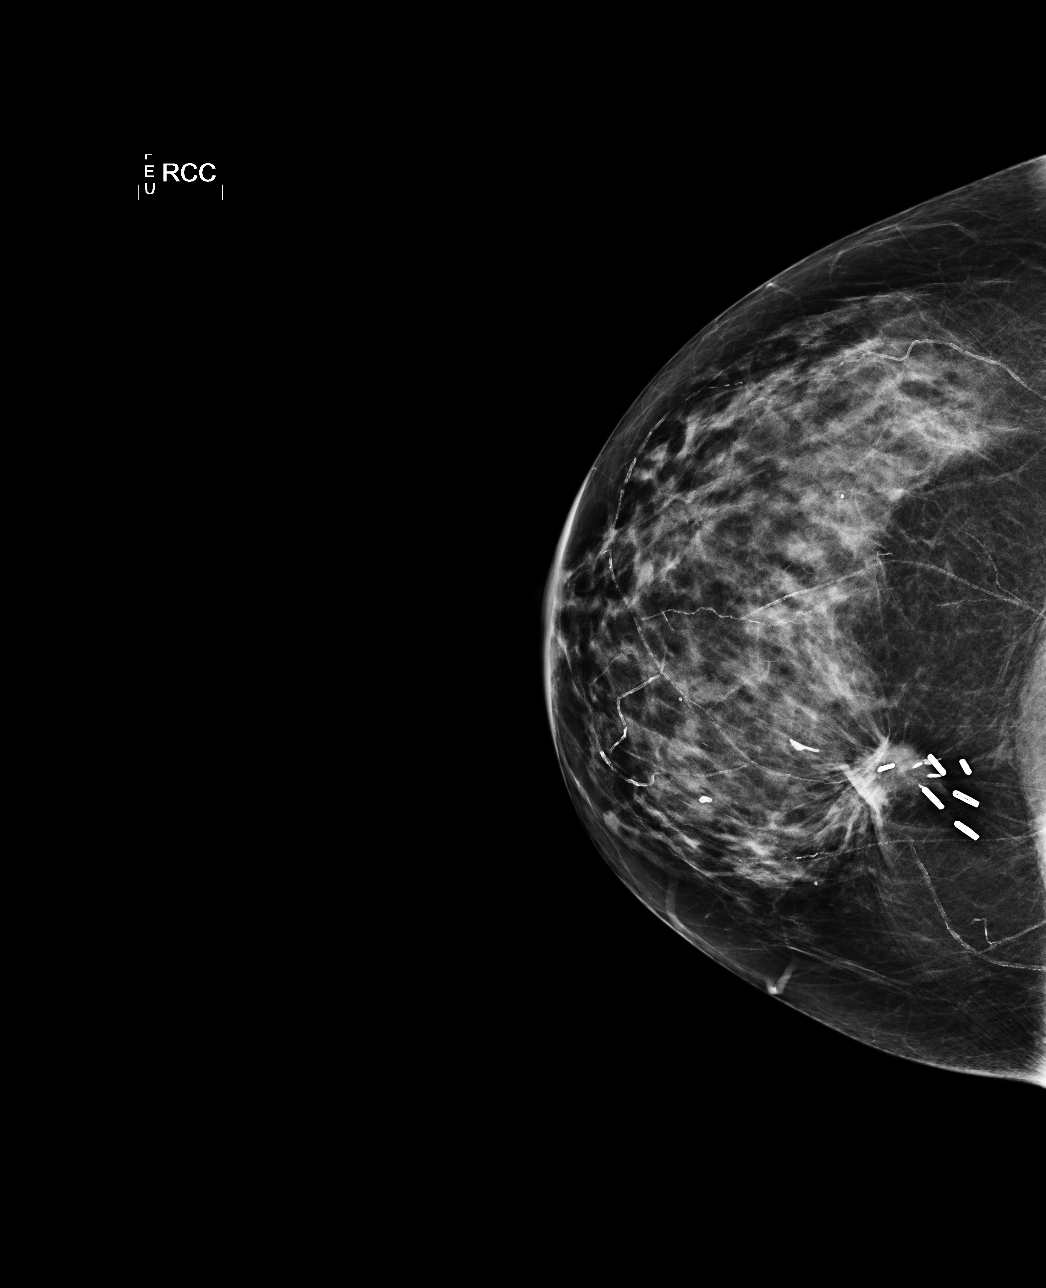

[L CC]
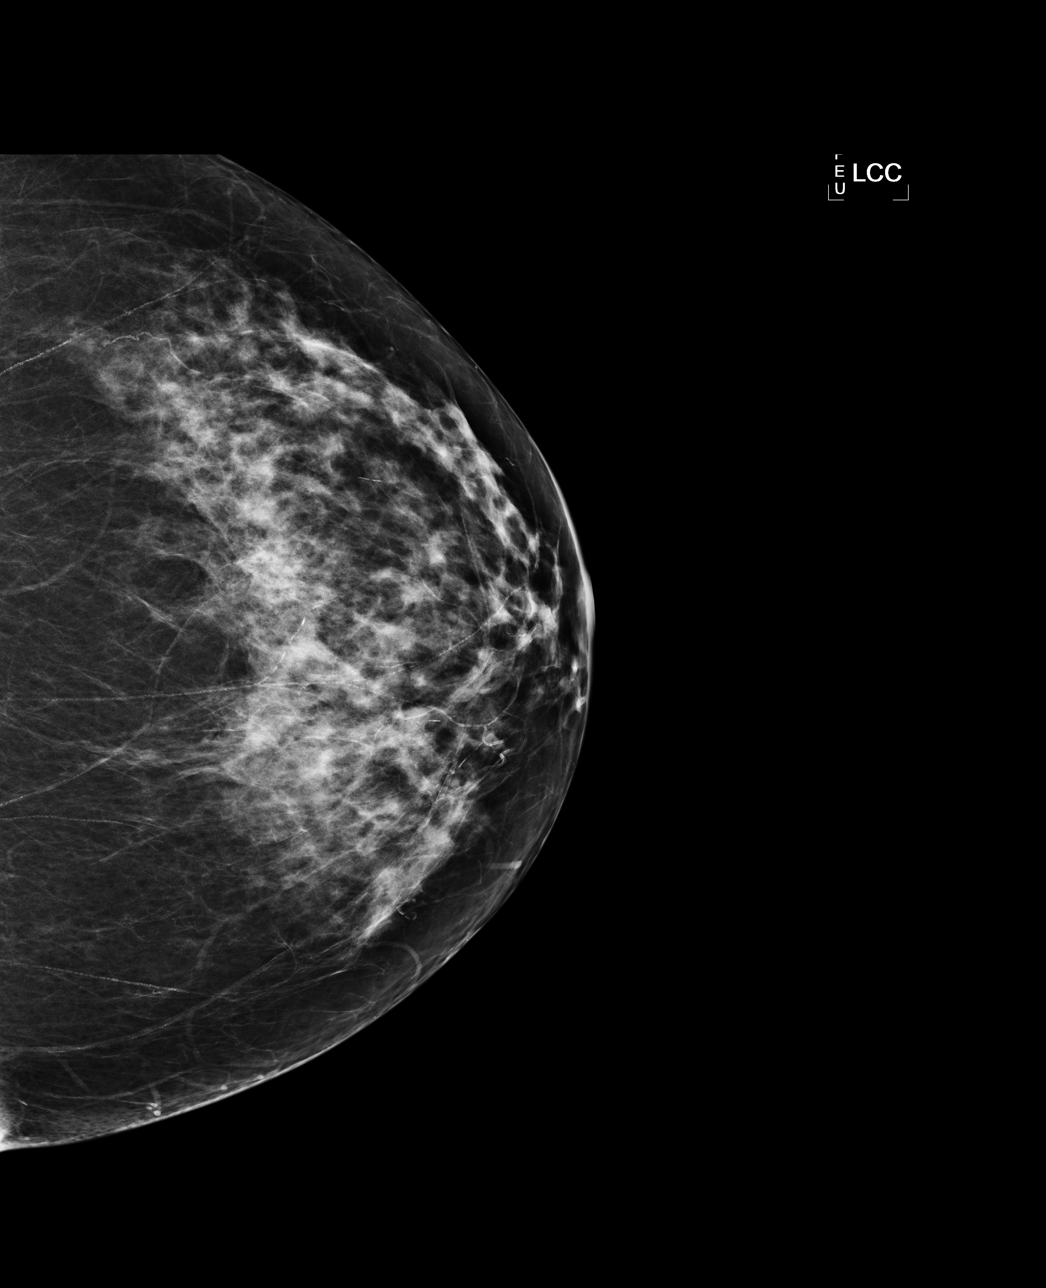

[L MLO]
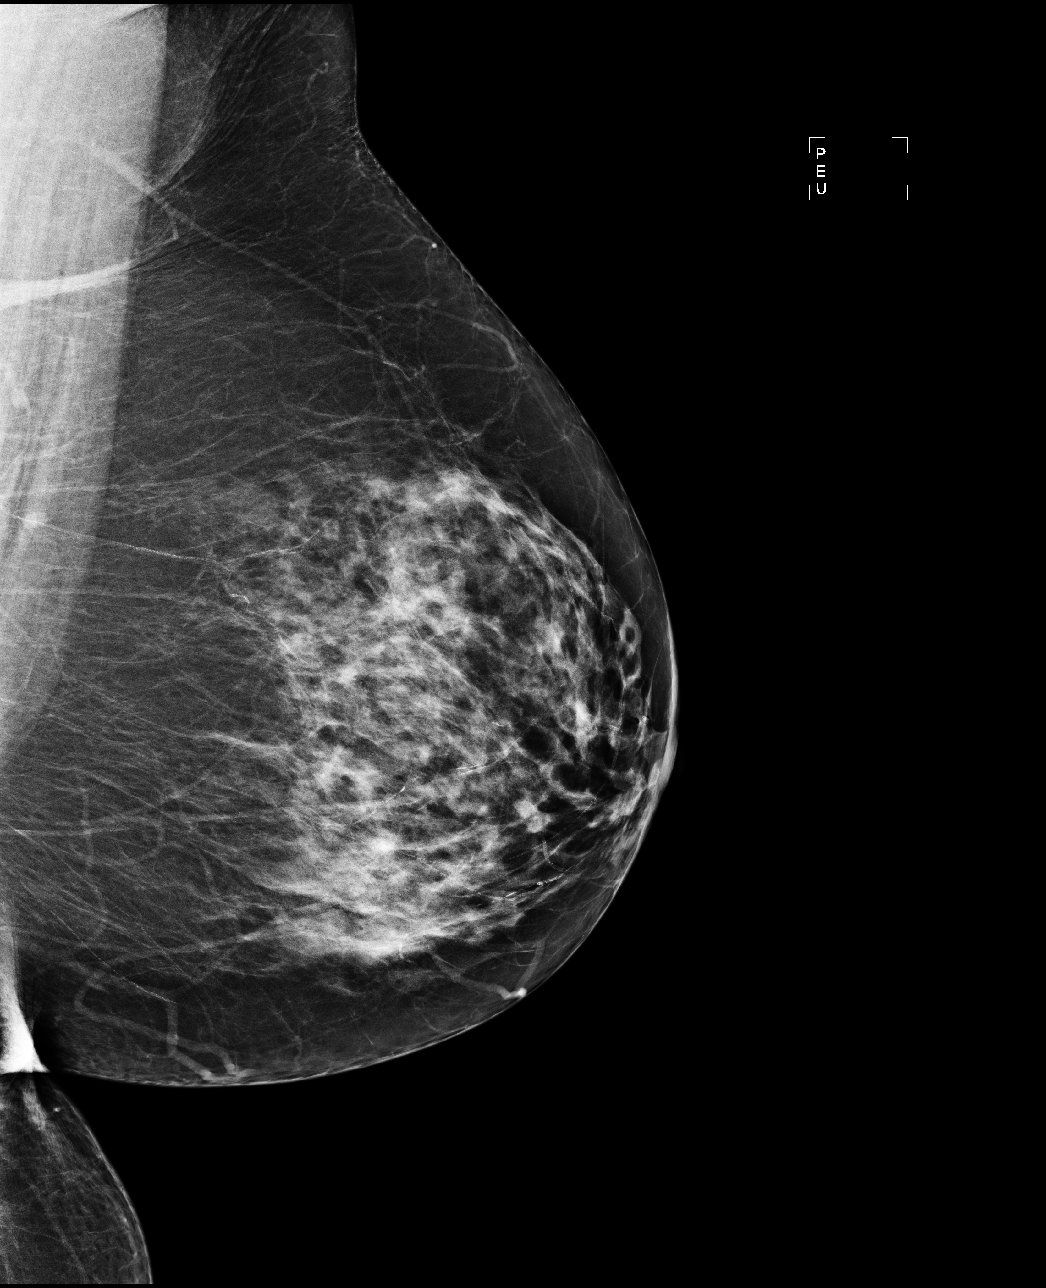

[R MLO]
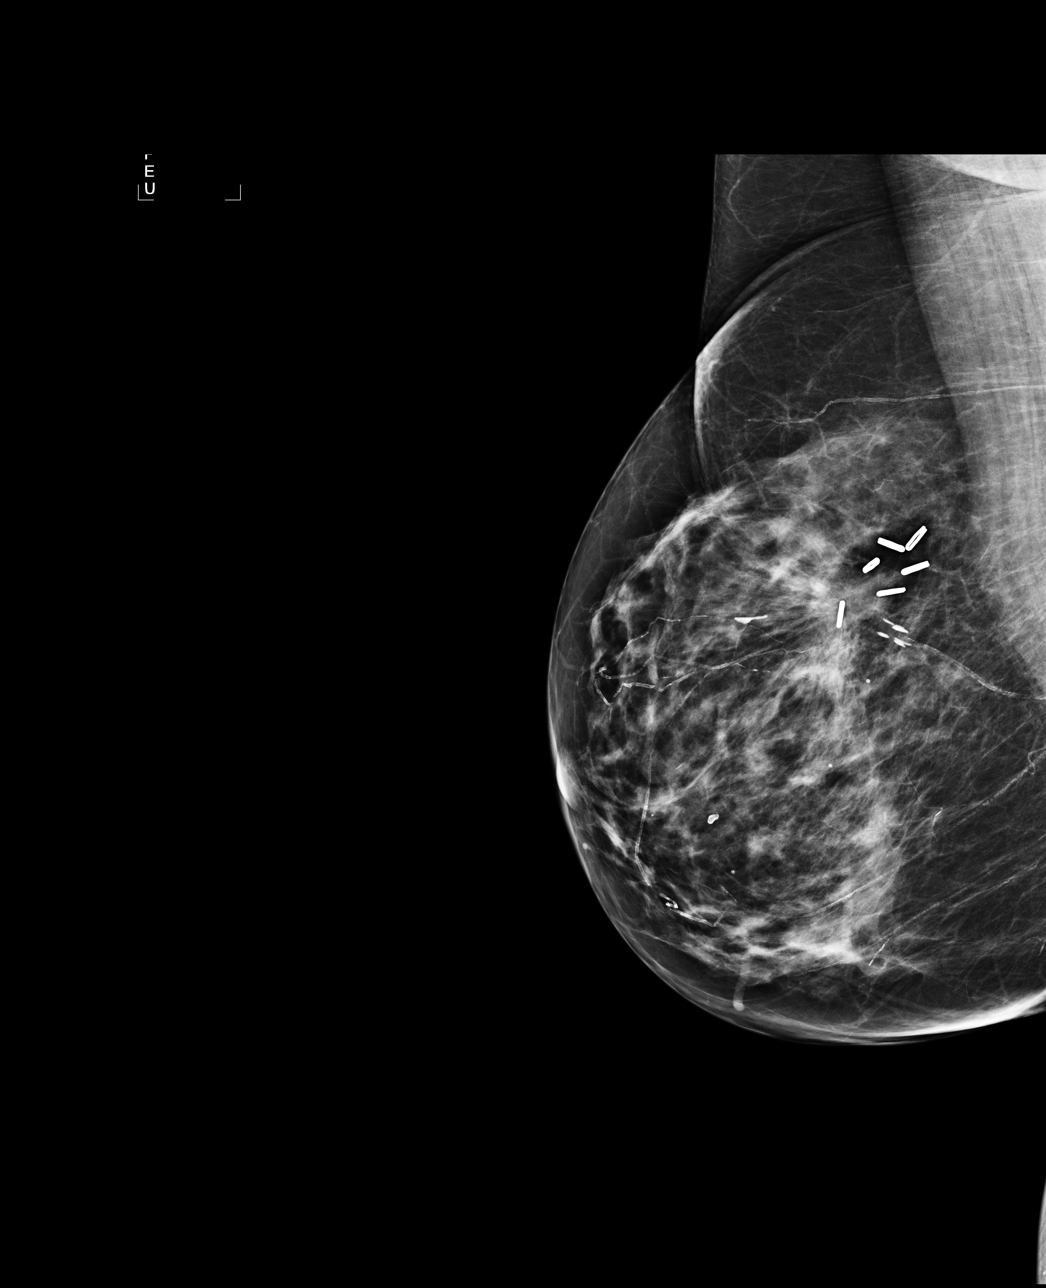

[4 of 4 positions shown; findings below may reference images not displayed]

IMPRESSION: No specific mammographic evidence of malignancy.  Next screening mammogram is recommended in one 
year.

A result letter of this screening mammogram will be mailed directly to the patient.

ASSESSMENT: Negative - BI-RADS 1

Screening mammogram in 1 year.
ANALYZED BY COMPUTER AIDED DETECTION. , THIS PROCEDURE WAS A DIGITAL MAMMOGRAM.

## 2010-09-02 ENCOUNTER — Encounter (HOSPITAL_BASED_OUTPATIENT_CLINIC_OR_DEPARTMENT_OTHER): Payer: Medicare Other | Admitting: Oncology

## 2010-09-02 DIAGNOSIS — D6859 Other primary thrombophilia: Secondary | ICD-10-CM

## 2010-09-02 DIAGNOSIS — Z86718 Personal history of other venous thrombosis and embolism: Secondary | ICD-10-CM

## 2010-09-02 DIAGNOSIS — Z853 Personal history of malignant neoplasm of breast: Secondary | ICD-10-CM

## 2010-09-02 DIAGNOSIS — Z7901 Long term (current) use of anticoagulants: Secondary | ICD-10-CM

## 2010-09-02 LAB — CBC WITH DIFFERENTIAL/PLATELET
BASO%: 0.8 % (ref 0.0–2.0)
Basophils Absolute: 0.1 10*3/uL (ref 0.0–0.1)
EOS%: 2.4 % (ref 0.0–7.0)
Eosinophils Absolute: 0.2 10*3/uL (ref 0.0–0.5)
HCT: 40.9 % (ref 34.8–46.6)
HGB: 13.7 g/dL (ref 11.6–15.9)
LYMPH%: 21.8 % (ref 14.0–49.7)
MCH: 31.2 pg (ref 25.1–34.0)
MCHC: 33.5 g/dL (ref 31.5–36.0)
MCV: 93.2 fL (ref 79.5–101.0)
MONO#: 0.8 10*3/uL (ref 0.1–0.9)
MONO%: 10.1 % (ref 0.0–14.0)
NEUT#: 5.4 10*3/uL (ref 1.5–6.5)
NEUT%: 64.9 % (ref 38.4–76.8)
Platelets: 214 10*3/uL (ref 145–400)
RBC: 4.39 10*6/uL (ref 3.70–5.45)
RDW: 13.2 % (ref 11.2–14.5)
WBC: 8.3 10*3/uL (ref 3.9–10.3)
lymph#: 1.8 10*3/uL (ref 0.9–3.3)
nRBC: 0 % (ref 0–0)

## 2010-09-02 LAB — PROTIME-INR
INR: 3.2 (ref 2.00–3.50)
Protime: 38.4 Seconds — ABNORMAL HIGH (ref 10.6–13.4)

## 2010-09-08 ENCOUNTER — Encounter (HOSPITAL_BASED_OUTPATIENT_CLINIC_OR_DEPARTMENT_OTHER): Payer: Medicare Other | Admitting: Oncology

## 2010-09-08 ENCOUNTER — Other Ambulatory Visit: Payer: Self-pay | Admitting: Oncology

## 2010-09-08 DIAGNOSIS — Z853 Personal history of malignant neoplasm of breast: Secondary | ICD-10-CM

## 2010-09-08 DIAGNOSIS — D6859 Other primary thrombophilia: Secondary | ICD-10-CM

## 2010-09-08 LAB — PROTIME-INR
INR: 2.2 (ref 2.00–3.50)
Protime: 26.4 Seconds — ABNORMAL HIGH (ref 10.6–13.4)

## 2010-09-16 ENCOUNTER — Ambulatory Visit (INDEPENDENT_AMBULATORY_CARE_PROVIDER_SITE_OTHER): Payer: Medicare Other | Admitting: Cardiovascular Disease

## 2010-09-16 DIAGNOSIS — R079 Chest pain, unspecified: Secondary | ICD-10-CM

## 2010-09-22 ENCOUNTER — Other Ambulatory Visit: Payer: Self-pay | Admitting: Oncology

## 2010-09-22 ENCOUNTER — Encounter (HOSPITAL_BASED_OUTPATIENT_CLINIC_OR_DEPARTMENT_OTHER): Payer: Medicare Other | Admitting: Oncology

## 2010-09-22 DIAGNOSIS — Z853 Personal history of malignant neoplasm of breast: Secondary | ICD-10-CM

## 2010-09-22 DIAGNOSIS — D6859 Other primary thrombophilia: Secondary | ICD-10-CM

## 2010-09-22 LAB — PROTIME-INR
INR: 2.8 (ref 2.00–3.50)
Protime: 33.6 Seconds — ABNORMAL HIGH (ref 10.6–13.4)

## 2010-09-30 ENCOUNTER — Telehealth (INDEPENDENT_AMBULATORY_CARE_PROVIDER_SITE_OTHER): Payer: Self-pay | Admitting: Radiology

## 2010-10-01 ENCOUNTER — Encounter: Payer: Self-pay | Admitting: Cardiology

## 2010-10-01 ENCOUNTER — Encounter (HOSPITAL_COMMUNITY): Payer: Medicare Other

## 2010-10-01 ENCOUNTER — Ambulatory Visit (HOSPITAL_COMMUNITY): Payer: Medicare Other | Attending: Internal Medicine

## 2010-10-01 DIAGNOSIS — R0609 Other forms of dyspnea: Secondary | ICD-10-CM

## 2010-10-01 DIAGNOSIS — R0989 Other specified symptoms and signs involving the circulatory and respiratory systems: Secondary | ICD-10-CM

## 2010-10-01 DIAGNOSIS — R079 Chest pain, unspecified: Secondary | ICD-10-CM | POA: Insufficient documentation

## 2010-10-01 DIAGNOSIS — R0789 Other chest pain: Secondary | ICD-10-CM

## 2010-10-06 ENCOUNTER — Ambulatory Visit (HOSPITAL_COMMUNITY): Payer: Medicare Other | Attending: Cardiovascular Disease

## 2010-10-06 ENCOUNTER — Encounter: Payer: Self-pay | Admitting: Cardiology

## 2010-10-06 DIAGNOSIS — R0789 Other chest pain: Secondary | ICD-10-CM

## 2010-10-06 NOTE — Progress Notes (Signed)
Summary: Nuclear Pre-Procedure  Phone Note Outgoing Call Call back at [facility] Phone [phone]   Call placed by: Stanton Kidney, EMT-P,  September 30, 2010 2:11 PM Call placed to: Patient Action Taken: Phone Call Completed Reason for Call: Confirm/change Appt Summary of Call: Left message with information on Myoview Information Sheet (see scanned document for details). Stanton Kidney, EMT-P  September 30, 2010 2:11 PM      Nuclear Med Background Indications for Stress Test: Evaluation for Ischemia   History: Echo, Heart Catheterization, Myocardial Perfusion Study  History Comments: 5/10 MPS-NML/ Echo-EF=55-65%, mil LVH  Symptoms: Chest Tightness  Symptoms Comments: Atypical CP   Nuclear Pre-Procedure Cardiac Risk Factors: CVA, Family History - CAD, TIA

## 2010-10-13 NOTE — Assessment & Plan Note (Signed)
Summary: Cardiology Nuclear Testing  Nuclear Med Background Indications for Stress Test: Evaluation for Ischemia   History: Asthma, Echo, Heart Catheterization, Myocardial Perfusion Study  History Comments: '10 [id number];'69 Echo:EF=55-65%, mild LVH  Symptoms: Chest Tightness, Chest Tightness with Exertion, Diaphoresis, Dizziness, DOE, Fatigue, Nausea, Near Syncope, Rapid HR, SOB  Symptoms Comments: Last episode of CP:2 days ago.   Nuclear Pre-Procedure Cardiac Risk Factors: CVA, Family History - CAD, Hypertension, Lipids, TIA Caffeine/Decaff Intake: None NPO After: 12:00 AM Lungs: Clear. IV 0.9% NS with Angio Cath: 18g     IV Site: L Antecubital IV Started by: Stanton Kidney, EMT-P Chest Size (in) 40     Cup Size C     Height (in): 65 Weight (lb): 175 BMI: 29.23  Nuclear Med Study 1 or 2 day study:  2 day     Stress Test Type:  Treadmill/Lexiscan Reading MD:  Willa Rough, MD     Referring MD:  Kristeen Miss, MD Resting Radionuclide:  Technetium 28m Tetrofosmin     Resting Radionuclide Dose:  33 mCi  Stress Radionuclide:  Technetium 52m Tetrofosmin     Stress Radionuclide Dose:  33 mCi   Stress Protocol Exercise Time (min):  8:03 min     Max HR:  112 bpm     Predicted Max HR:  157 bpm  Max Systolic BP: 155 mm Hg     Percent Max HR:  71.34 %     METS: 7.0 Rate Pressure Product:  62952  Lexiscan: 0.4 mg   Stress Test Technologist:  Rea College, CMA-N     Nuclear Technologist:  Doyne Keel, CNMT  Rest Procedure  Myocardial perfusion imaging was performed at rest 45 minutes following the intravenous administration of Technetium 57m Tetrofosmin.  Stress Procedure  The patient attempted to walk the treadmill utilizing the Bruce protocol, but was unable to obtain her heart rate.  She was then given IV Lexiscan 0.4 mg over 15-seconds with concurrent low level exercise and then Technetium 44m Tetrofosmin was injected at 30-seconds.  There were no diagnostic changes with infusion.   She did c/o chest pressure with infusion.  Quantitative spect images were obtained after a 45 minute delay.  QPS Raw Data Images:  Normal; no motion artifact; normal heart/lung ratio. Stress Images:  Normal homogeneous uptake in all areas of the myocardium. Rest Images:  Normal homogeneous uptake in all areas of the myocardium. Subtraction (SDS):  No evidence of ischemia. Transient Ischemic Dilatation:  .90  (Normal <1.22)  Lung/Heart Ratio:  .18  (Normal <0.45)  Quantitative Gated Spect Images QGS EDV:  49 ml QGS ESV:  10 ml QGS EF:  80 % QGS cine images:  Normal  Findings Normal nuclear study      Overall Impression  Exercise Capacity: Lexiscan with no exercise. BP Response: Normal blood pressure response. Clinical Symptoms: Chest pain ECG Impression: No significant ST segment change suggestive of ischemia. Overall Impression Comments: On the treadmill, adequate heart rate could not be obtained. Study switched to [place]. There is no scar or ischemia.

## 2010-10-20 ENCOUNTER — Other Ambulatory Visit: Payer: Self-pay | Admitting: Oncology

## 2010-10-20 ENCOUNTER — Encounter (HOSPITAL_BASED_OUTPATIENT_CLINIC_OR_DEPARTMENT_OTHER): Payer: Medicare Other | Admitting: Oncology

## 2010-10-20 DIAGNOSIS — Z86718 Personal history of other venous thrombosis and embolism: Secondary | ICD-10-CM

## 2010-10-20 DIAGNOSIS — D6859 Other primary thrombophilia: Secondary | ICD-10-CM

## 2010-10-20 DIAGNOSIS — Z7901 Long term (current) use of anticoagulants: Secondary | ICD-10-CM

## 2010-10-20 DIAGNOSIS — Z853 Personal history of malignant neoplasm of breast: Secondary | ICD-10-CM

## 2010-10-20 LAB — PROTIME-INR
INR: 3.2 (ref 2.00–3.50)
Protime: 38.4 Seconds — ABNORMAL HIGH (ref 10.6–13.4)

## 2010-10-26 LAB — SAMPLE TO BLOOD BANK

## 2010-10-26 LAB — PROTIME-INR
INR: 1.99 — ABNORMAL HIGH (ref 0.00–1.49)
Prothrombin Time: 22.4 seconds — ABNORMAL HIGH (ref 11.6–15.2)

## 2010-10-26 LAB — PSA: PSA: <0.01 ng/mL — ABNORMAL LOW (ref 0.10–4.00)

## 2010-11-03 LAB — APTT: aPTT: 47 seconds — ABNORMAL HIGH (ref 24–37)

## 2010-11-03 LAB — CARDIAC PANEL(CRET KIN+CKTOT+MB+TROPI)
CK, MB: 1.6 ng/mL (ref 0.3–4.0)
CK, MB: 2 ng/mL (ref 0.3–4.0)
Relative Index: 1.7 (ref 0.0–2.5)
Relative Index: INVALID (ref 0.0–2.5)
Total CK: 119 U/L (ref 7–177)
Total CK: 91 U/L (ref 7–177)
Troponin I: 0.01 ng/mL (ref 0.00–0.06)
Troponin I: <0.01 ng/mL (ref 0.00–0.06)

## 2010-11-03 LAB — URINALYSIS, ROUTINE W REFLEX MICROSCOPIC
Bilirubin Urine: NEGATIVE
Glucose, UA: NEGATIVE mg/dL
Ketones, ur: NEGATIVE mg/dL
Leukocytes, UA: NEGATIVE
Nitrite: NEGATIVE
Protein, ur: NEGATIVE mg/dL
Specific Gravity, Urine: 1.007 (ref 1.005–1.030)
Urobilinogen, UA: 0.2 mg/dL (ref 0.0–1.0)
pH: 6 (ref 5.0–8.0)

## 2010-11-03 LAB — CBC
HCT: 35.8 % — ABNORMAL LOW (ref 36.0–46.0)
HCT: 36.7 % (ref 36.0–46.0)
HCT: 37.6 % (ref 36.0–46.0)
Hemoglobin: 12.2 g/dL (ref 12.0–15.0)
Hemoglobin: 12.9 g/dL (ref 12.0–15.0)
Hemoglobin: 12.9 g/dL (ref 12.0–15.0)
MCHC: 34.1 g/dL (ref 30.0–36.0)
MCHC: 34.4 g/dL (ref 30.0–36.0)
MCHC: 35.2 g/dL (ref 30.0–36.0)
MCV: 90.2 fL (ref 78.0–100.0)
MCV: 91 fL (ref 78.0–100.0)
MCV: 91.1 fL (ref 78.0–100.0)
Platelets: 165 10*3/uL (ref 150–400)
Platelets: 181 10*3/uL (ref 150–400)
Platelets: 186 10*3/uL (ref 150–400)
RBC: 3.94 MIL/uL (ref 3.87–5.11)
RBC: 4.07 MIL/uL (ref 3.87–5.11)
RBC: 4.12 MIL/uL (ref 3.87–5.11)
RDW: 14 % (ref 11.5–15.5)
RDW: 14.2 % (ref 11.5–15.5)
RDW: 14.2 % (ref 11.5–15.5)
WBC: 4.8 10*3/uL (ref 4.0–10.5)
WBC: 5.2 10*3/uL (ref 4.0–10.5)
WBC: 6.7 10*3/uL (ref 4.0–10.5)

## 2010-11-03 LAB — POCT CARDIAC MARKERS
CKMB, poc: 1.7 ng/mL (ref 1.0–8.0)
Myoglobin, poc: 96.8 ng/mL (ref 12–200)
Troponin i, poc: <0.05 ng/mL (ref 0.00–0.09)

## 2010-11-03 LAB — PROTIME-INR
INR: 2.5 — ABNORMAL HIGH (ref 0.00–1.49)
INR: 2.7 — ABNORMAL HIGH (ref 0.00–1.49)
INR: 2.8 — ABNORMAL HIGH (ref 0.00–1.49)
Prothrombin Time: 28.4 seconds — ABNORMAL HIGH (ref 11.6–15.2)
Prothrombin Time: 30.9 seconds — ABNORMAL HIGH (ref 11.6–15.2)
Prothrombin Time: 31.7 seconds — ABNORMAL HIGH (ref 11.6–15.2)

## 2010-11-03 LAB — COMPREHENSIVE METABOLIC PANEL
ALT: 21 U/L (ref 0–35)
AST: 17 U/L (ref 0–37)
Albumin: 4.1 g/dL (ref 3.5–5.2)
Alkaline Phosphatase: 95 U/L (ref 39–117)
BUN: 18 mg/dL (ref 6–23)
CO2: 26 mEq/L (ref 19–32)
Calcium: 8.7 mg/dL (ref 8.4–10.5)
Chloride: 106 mEq/L (ref 96–112)
Creatinine, Ser: 1.06 mg/dL (ref 0.4–1.2)
GFR calc Af Amer: >60 mL/min (ref >60)
GFR calc non Af Amer: 53 mL/min — ABNORMAL LOW (ref >60)
Glucose, Bld: 108 mg/dL — ABNORMAL HIGH (ref 70–99)
Potassium: 3.7 mEq/L (ref 3.5–5.1)
Sodium: 140 mEq/L (ref 135–145)
Total Bilirubin: 0.5 mg/dL (ref 0.3–1.2)
Total Protein: 7.4 g/dL (ref 6.0–8.3)

## 2010-11-03 LAB — DIFFERENTIAL
Basophils Absolute: 0 10*3/uL (ref 0.0–0.1)
Basophils Relative: 1 % (ref 0–1)
Eosinophils Absolute: 0.1 10*3/uL (ref 0.0–0.7)
Eosinophils Relative: 2 % (ref 0–5)
Lymphocytes Relative: 33 % (ref 12–46)
Lymphs Abs: 2.2 10*3/uL (ref 0.7–4.0)
Monocytes Absolute: 0.5 10*3/uL (ref 0.1–1.0)
Monocytes Relative: 7 % (ref 3–12)
Neutro Abs: 3.8 10*3/uL (ref 1.7–7.7)
Neutrophils Relative %: 57 % (ref 43–77)

## 2010-11-03 LAB — TROPONIN I: Troponin I: <0.01 ng/mL (ref 0.00–0.06)

## 2010-11-03 LAB — CK TOTAL AND CKMB (NOT AT ARMC)
CK, MB: 2.6 ng/mL (ref 0.3–4.0)
Relative Index: 1.8 (ref 0.0–2.5)
Total CK: 148 U/L (ref 7–177)

## 2010-11-03 LAB — LIPID PANEL
Cholesterol: 136 mg/dL (ref 0–200)
HDL: 35 mg/dL — ABNORMAL LOW (ref >39)
LDL Cholesterol: 84 mg/dL (ref 0–99)
Total CHOL/HDL Ratio: 3.9 RATIO
Triglycerides: 84 mg/dL (ref <150)
VLDL: 17 mg/dL (ref 0–40)

## 2010-11-03 LAB — SEDIMENTATION RATE: Sed Rate: 11 mm/hr (ref 0–22)

## 2010-11-03 LAB — TSH: TSH: 0.499 u[IU]/mL (ref 0.350–4.500)

## 2010-11-03 LAB — URINE MICROSCOPIC-ADD ON

## 2010-11-09 ENCOUNTER — Encounter (HOSPITAL_BASED_OUTPATIENT_CLINIC_OR_DEPARTMENT_OTHER): Payer: Medicare Other | Admitting: Oncology

## 2010-11-09 ENCOUNTER — Other Ambulatory Visit: Payer: Self-pay | Admitting: Oncology

## 2010-11-09 DIAGNOSIS — Z86718 Personal history of other venous thrombosis and embolism: Secondary | ICD-10-CM

## 2010-11-09 DIAGNOSIS — Z853 Personal history of malignant neoplasm of breast: Secondary | ICD-10-CM

## 2010-11-09 DIAGNOSIS — D6859 Other primary thrombophilia: Secondary | ICD-10-CM

## 2010-11-09 DIAGNOSIS — Z7901 Long term (current) use of anticoagulants: Secondary | ICD-10-CM

## 2010-11-09 LAB — PROTIME-INR
INR: 4.6 — ABNORMAL HIGH (ref 2.00–3.50)
Protime: 55.2 Seconds — ABNORMAL HIGH (ref 10.6–13.4)

## 2010-11-16 ENCOUNTER — Encounter (HOSPITAL_BASED_OUTPATIENT_CLINIC_OR_DEPARTMENT_OTHER): Payer: Medicare Other | Admitting: Oncology

## 2010-11-16 ENCOUNTER — Other Ambulatory Visit: Payer: Self-pay | Admitting: Oncology

## 2010-11-16 DIAGNOSIS — Z7901 Long term (current) use of anticoagulants: Secondary | ICD-10-CM

## 2010-11-16 DIAGNOSIS — Z86718 Personal history of other venous thrombosis and embolism: Secondary | ICD-10-CM

## 2010-11-16 DIAGNOSIS — Z853 Personal history of malignant neoplasm of breast: Secondary | ICD-10-CM

## 2010-11-16 DIAGNOSIS — D6859 Other primary thrombophilia: Secondary | ICD-10-CM

## 2010-11-16 LAB — PROTIME-INR
INR: 2.1 (ref 2.00–3.50)
Protime: 25.2 Seconds — ABNORMAL HIGH (ref 10.6–13.4)

## 2010-12-01 ENCOUNTER — Other Ambulatory Visit: Payer: Self-pay | Admitting: Oncology

## 2010-12-01 ENCOUNTER — Encounter (HOSPITAL_BASED_OUTPATIENT_CLINIC_OR_DEPARTMENT_OTHER): Payer: Medicare Other | Admitting: Oncology

## 2010-12-01 DIAGNOSIS — Z7901 Long term (current) use of anticoagulants: Secondary | ICD-10-CM

## 2010-12-01 DIAGNOSIS — Z86718 Personal history of other venous thrombosis and embolism: Secondary | ICD-10-CM

## 2010-12-01 DIAGNOSIS — Z853 Personal history of malignant neoplasm of breast: Secondary | ICD-10-CM

## 2010-12-01 DIAGNOSIS — D6859 Other primary thrombophilia: Secondary | ICD-10-CM

## 2010-12-01 LAB — PROTIME-INR
INR: 2.2 (ref 2.00–3.50)
Protime: 26.4 Seconds — ABNORMAL HIGH (ref 10.6–13.4)

## 2010-12-02 ENCOUNTER — Other Ambulatory Visit: Payer: Self-pay | Admitting: Oncology

## 2010-12-02 DIAGNOSIS — Z9889 Other specified postprocedural states: Secondary | ICD-10-CM

## 2010-12-02 DIAGNOSIS — Z1231 Encounter for screening mammogram for malignant neoplasm of breast: Secondary | ICD-10-CM

## 2010-12-08 NOTE — H&P (Signed)
[name], [name]              ACCOUNT NO.:  [id number]   MEDICAL RECORD NO.:  [id number]          PATIENT TYPE:  INP   LOCATION:  3707                         FACILITY:  [facility]   PHYSICIAN:  Vesta Mixer, M.D. DATE OF BIRTH:  02-Jan-1946   DATE OF ADMISSION:  01/02/2008  DATE OF DISCHARGE:                              HISTORY & PHYSICAL   [name] is a 65 year old female with a history of chest pains.  She also has a history of DVT and fibromyalgia.  She has a history of  Barrett's esophagus.  She is admitted now with 3 days of chest pain.   The patient has a long history of chest pain.  She has been admitted to  the hospital several years ago.  She ruled out for myocardial infarction  and subsequent stress Cardiolite study was unremarkable.  She has a  known history of DVT with pulmonary emboli.  She has a positive  anticardiolipin antibody.  She had been on chronic Coumadin therapy and  this has been regulated by Dr. Cyndie Chime.  Her INRs have been fairly  therapeutic.   She has a history of Barrett's esophagus and is status post surgical  correction of that.   The patient presents with 3 days history of chest pain.  The chest pain  has been fairly constant.  It appears to radiates through to her back.  She has noticed some dyspnea on exertion.  She denies any syncope or  presyncope.  She denies any PND or orthopnea.   CURRENT MEDICATIONS:  1. Synthroid 0.125 mg a day.  2. Coumadin 8 mg a day.  3. Wellbutrin 200 mg twice a day.  4. Aspirin 81 mg a day.  5. Crestor 10 mg a day.   ALLERGIES:  She is allergic to Levaquin and Daypro.   PAST MEDICAL HISTORY:  1. Positive antiphospholipid antibody.  2. She has a positive ANA.  3. She is on chronic Coumadin therapy.  4. History of breast cancer.  5. History of Barrett's esophagus.  She is status post Nissen      fundoplication.  6. Hypercholesterolemia.  7. Fibromyalgia.  8. History of chest pain.   SOCIAL  HISTORY:  The patient is a nonsmoker and nondrinker.   FAMILY HISTORY:  Positive for coronary artery disease.   REVIEW OF SYSTEMS:  As noted in HPI and is otherwise negative.   PHYSICAL EXAMINATION:  GENERAL:  On exam, she is a middle-aged female in  no acute distress.  She is alert and oriented x3 and her mood and affect  are normal.  VITAL SIGNS:  Her heart rate 64 and blood pressure is 167/85.  HEENT:  Exam reveals 2+ carotids.  She has no bruits, no JVD, no  thyromegaly.  LUNGS:  Clear to auscultation.  HEART:  Regular rate and S1, S2.  ABDOMINAL:  Exam reveals good bowel sounds and is nontender.  EXTREMITIES:  She has no clubbing, cyanosis, or edema.  NEURO:  Exam is nonfocal.   [name]' EKG is pending.   Her telemetry monitor reveals normal sinus rhythm.  There is  no ST  changes on telemetry monitoring.   [name] presents with some atypical chest pain.  Her initial  cardiac enzymes are negative.  Additional electrolytes are normal as is  her white blood cell count.  We will admit her for 24-hour observation.  If she rules out, we will see her back in the office for an outpatient  stress test.  All of her other medical problems remain stable.           ______________________________  Vesta Mixer, M.D.     PJN/MEDQ  D:  01/02/2008  T:  01/03/2008  Job:  [id number]   cc:   Ace Gins, MD  Genene Churn. Cyndie Chime, M.D.

## 2010-12-08 NOTE — H&P (Signed)
NAMECYNIA, ABRUZZO              ACCOUNT NO.:  [id number]   MEDICAL RECORD NO.:  [id number]          PATIENT TYPE:  INP   LOCATION:  3701                         FACILITY:  [facility]   PHYSICIAN:  Vesta Mixer, M.D. DATE OF BIRTH:  07-19-1946   DATE OF ADMISSION:  11/29/2006  DATE OF DISCHARGE:                              HISTORY & PHYSICAL   HISTORY:  [name] is a 65 year old female with a history of  antiphospholipid antibody with peripheral emboli.  She is admitted with  chest pain.   The patient has a long history of chest pains in the past.  She  presented for with chest pain in 1998 and a subsequent heart  catheterization revealed clean coronary arteries.  She has had several  stress tests since that time, the last one being in 2005.  That was also  negative for ischemia.  She was admitted by Dr. [place] last year for  chest pain and was discharged home from the emergency room in  satisfactory condition.   The patient has a history of peripheral emboli and is on chronic  Coumadin therapy.  She has been monitored by Dr. Cyndie Chime, and her  prothrombin times have been in the therapeutic range.   Last night she developed some chest discomfort.  It was described as a  chest pressure.  It tended to worsen with deep breath.  The chest pain  continued all night and this morning it seemed to radiate up to her neck  and down her left arm.  She presented to the [facility]  and was referred to the emergency room for further evaluation.   She continued to have chest pain, which was partially relieved with  sublingual nitroglycerin.   The patient is still having some very mild discomfort, but is overall  largely pain free.   CURRENT MEDICATIONS:  1. Synthroid 0.125 mg a day.  2. Coumadin 8 mg a day.  3. Wellbutrin 200 mg twice a day.  4. Ambien as needed.  5. Aspirin 81 mg a day.   ALLERGIES:  She is allergic to vitamin K, Levaquin and Daypro.   PAST MEDICAL HISTORY:  1. Antiphospholipid antibody.  She also has a positive ANA and some      other hematologic antibodies.  She is on chronic Coumadin therapy,      followed by Dr. Cyndie Chime.  2. History of breast cancer.  3. History of Barrett's esophagus.  She is status post Nissen      fundoplication approximately 10 years ago.  4. Hypercholesterolemia.  5. Fibromyalgia.  6. History of chest pains.   SOCIAL HISTORY:  The patient is a nonsmoker.  She does not drink  alcohol.   FAMILY HISTORY:  Positive for coronary artery disease.   REVIEW OF SYSTEMS:  Is as noted in the HPI.  She denies any syncope or  presyncope.  She denies any PND or orthopnea.  She denies any easy  bruising.  She denies any blood in her urine or blood in her stool.  She  denies any heat or cold intolerance, weight gain or  weight loss.  Review  of systems is otherwise negative except for as noted in HPI.   PHYSICAL EXAMINATION:  GENERAL:  On exam she is a middle-aged female in  no acute distress.  She still complains of very mild chest pain.  VITAL SIGNS:  Heart rate 85, blood pressure 155/79.  HEENT:  Reveals 2+ carotids.  She has no JVD, no thyromegaly.  \  LUNGS: Clear to auscultation.  HEART: Regular rate S1-S2.  MUSCULOSKELETAL:  She has no chest wall tenderness.  ABDOMINAL:  Exam reveals good bowel sounds and is nontender.  EXTREMITIES:  She has no clubbing, cyanosis or edema.  NEUROLOGIC:  Exam was nonfocal.   LABORATORY DATA:  CPK-MB is normal at 1.1.  White blood cell count 6.7,  hemoglobin 12.4.  Sodium 139, potassium 4.2, chloride 105, BUN 8,  creatinine 1.1.  Prothrombin time 26.6, with an INR of 2.3.  PTT 48.  Her EKG reveals normal sinus rhythm.  There were was no ST or T-wave  changes.   IMPRESSION AND PLAN:  1. CHEST PAIN.  The patient presents with some chest pains.  Her      cardiac evaluation was negative thus far.  There is a definite      pleuritic component to this chest  pain.  We will get a CT angio of      her chest to rule out pulmonary emboli.   She also has a history of Barrett's esophagus, and it is possible that  this could represent an esophageal stricture or perhaps gastroesophageal  reflux disease.  We will add Prevacid to her medical regimen.  I have  notified Dr. Virginia Rochester and Dr. Cyndie Chime of her admission.  They will review  their notes notes and see her as needed.  Currently, I do not think  there is any GI or hematologic issues that need to be addressed.  We  will follow her and admit her for at least observation tonight.  We will  make further decisions pending labs from tomorrow.           ______________________________  Vesta Mixer, M.D.     PJN/MEDQ  D:  11/29/2006  T:  11/29/2006  Job:  [id number]   cc:   Ace Gins, MD  Genene Churn. Cyndie Chime, M.D.  Georgiana Spinner, M.D.

## 2010-12-08 NOTE — Discharge Summary (Signed)
[name], [name]              ACCOUNT NO.:  [id number]   MEDICAL RECORD NO.:  [id number]          PATIENT TYPE:  INP   LOCATION:  3707                         FACILITY:  [facility]   PHYSICIAN:  Vesta Mixer, M.D. DATE OF BIRTH:  1945-11-03   DATE OF ADMISSION:  01/02/2008  DATE OF DISCHARGE:  01/03/2008                               DISCHARGE SUMMARY   DISCHARGE DIAGNOSES:  1. Noncardiac chest pain and neck pain.  2. History of Barrett's esophagus.  3. History of antiphospholipid antibody.  4. Chronic Coumadin therapy.  5. Hypothyroidism.  6. History of pulmonary emboli.   DISCHARGE MEDICATIONS:  1. Coumadin 8 mg a day or as otherwise directed by Dr. Cephas Darby.  2. Synthroid 0.125 mg a day.  3. Wellbutrin 200 mg twice a day.  4. Aspirin 81 mg a day.  5. Crestor 10 mg a day.  6. Prilosec 20 mg twice a day.  7. Os-Cal one tablet twice a day.  8. Ambien 5 mg at night.   DISPOSITION:  The patient will see Dr. Elease Hashimoto in a week or two for a  stress Cardiolite study.  She is to follow up with her general medical  doctor for further evaluation for her neck pain and back pain.   She had been previously scheduled to have a carotid duplex scan that she  actually never got this done.  She had to go out of town  and had to  cancel the appointment.  We will reschedule this at sometime in the near  future.   HISTORY:  [name] is a 65 year old female with a history of DVT and  pulmonary emboli secondary to an antiphospholipid antibody.  She was  admitted to the hospital with very atypical chest pains and back pain.  Please see dictated H&P for further details.   HOSPITAL COURSE PER PROBLEMS:  Cardiac:  The patient ruled out for  myocardial infarction.  Her EKG remained unremarkable.  The patient has  had a negative Cardiolite study in the past.  We will discharge her from  the hospital.  She will follow up with Dr. Elease Hashimoto and most likely have a  repeat stress  Cardiolite study in the near future.   We have asked her to return to her general medical doctor for further  evaluation of her neck and back pain.  All of her other medical problems  were stable.  Her INR was therapeutic at 2.4.  She will follow up with  Dr. Cyndie Chime regarding this.           ______________________________  Vesta Mixer, M.D.     PJN/MEDQ  D:  01/03/2008  T:  01/03/2008  Job:  [id number]   cc:   Ace Gins, MD  Clent Demark, M.D.  Genene Churn. Cyndie Chime, M.D.

## 2010-12-08 NOTE — Discharge Summary (Signed)
[name], [name]              ACCOUNT NO.:  [id number]   MEDICAL RECORD NO.:  [id number]          PATIENT TYPE:  INP   LOCATION:  3036                         FACILITY:  [facility]   PHYSICIAN:  Monte Fantasia, MD  DATE OF BIRTH:  12/30/1945   DATE OF ADMISSION:  11/24/2008  DATE OF DISCHARGE:                               DISCHARGE SUMMARY   PRIMARY CARE PHYSICIAN:  Production assistant, radio.   DISCHARGE DIAGNOSES:  1. Transient ischemic attack.  2. Uncontrolled high blood pressure.  3. Fibromyalgia.  4. Lupus anticoagulant.  5. Coagulopathy on Coumadin therapy.   MEDICATIONS UPON DISCHARGE:  1. Coumadin 8 mg p.o. daily.  2. Synthroid 112 mcg p.o. daily.  3. Wellbutrin 200 mg p.o. b.i.d.  4. Crestor 10 mg p.o. daily.  5. Aspirin 81 mg p.o. daily.  6. Omeprazole 20 mg p.o. daily.  7. Neurontin 600 mg p.o. daily.  8. Ambien 5 mg p.o. nightly p.r.n. insomnia.  9. Senna 1 tablet p.o. nightly.   COURSE DURING THE STAY IN THE HOSPITAL:  The patient is a 65 year old  Caucasian lady.  The patient was admitted on Nov 24, 2008 with complaints  of left-sided numbness and weakness.  The patient stated that she  started having this discomfort about 3 hours prior to the arrival.  The  patient on admission had an INR which was therapeutic, however, and both  Coumadin and aspirin has been continued through the stay in the  hospital.  The patient was also evaluated by Physical Therapy and  recommended home physical therapy for the same.  The patient had an MRI  and MRA during the stay in the hospital, which showed negative  intracranial MRA of large and medium-sized vessels and MRI of the head  showed chronic vessel disease.  The patient at present is awaiting 2-D  echo and carotid Dopplers for the same.  The patient states that her  numbness has improved through the stay in the hospital, is recommended  to follow up with her primary care physician as an outpatient.   RADIOLOGICAL  INVESTIGATIONS DONE DURING THE STAY IN THE HOSPITAL:  1. CT of head done on Nov 24, 2008; impression, negative noncontrast      head CT.  2. Chest x-ray done on Nov 24, 2008; impression, no active disease.  3. 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  MRI of the head; impression, chronic small vessel      disease within the hemispheric white matter, no definite acute      infarction, punctate focus of the restricted diffusion or apparent      restriction of the diffusion in ventral pons on the right, may be      simply an artifact, but cannot rule out by any pontine infarction.   LABS DONE DURING THE STAY IN THE HOSPITAL:  Total bilirubin 5.2.  Hemoglobin 12.2, hematocrit  35.8, platelets of 165.  Prothrombin time  31.7, INR 2.8.  Cardiac enzymes x3 sets negative.  Total cholesterol  136, triglycerides 84, HDL 35, LDL 84.  UA has been negative.  TSH  0.499.   PHYSICAL EXAMINATION:  VITAL SIGNS:  On examination today, temperature  of 97.3, pulse of 66, respirations 16, blood pressure 103/68, oxygen  saturation 96% room air.  HEENT:  Neck is supple.  Pupils are equal, reacting to light.  No  pallor.  No lymphadenopathy.  RESPIRATORY:  Air entry is bilaterally equal.  No rales.  No rhonchi.  CARDIOVASCULAR:  S1, S2, regular rate and rhythm.  ABDOMEN:  Soft.  No guarding.  No rigidity.  No tenderness.  EXTREMITIES:  No edema of feet.  CNS:  The patient is alert, awake, oriented x3.  No cranial nerve  deficits, II-XII intact.  Right upper and lower extremities have been  5/5, left upper and lower extremity is 4/5.  Deep tendon reflexes are  equivocal on both sides.   DISPOSITION:  The patient is medically stable to be discharged and is  awaiting 2-D echo and carotid Dopplers for the same.  We will have the 2-  D  echo, then carotid Dopplers done today and would plan for discharge  accordingly.  The patient is recommended to follow up with her primary  care physician in next 1 week.   TOTAL TIME FOR DISCHARGE:  Thirty five minutes      Monte Fantasia, MD  Electronically Signed     MP/MEDQ  D:  11/25/2008  T:  11/26/2008  Job:  [id number]   cc:   [facility]

## 2010-12-08 NOTE — Discharge Summary (Signed)
NAMENATALIE, MCEUEN              ACCOUNT NO.:  [id number]   MEDICAL RECORD NO.:  [id number]          PATIENT TYPE:  INP   LOCATION:  3701                         FACILITY:  [facility]   PHYSICIAN:  Vesta Mixer, M.D. DATE OF BIRTH:  11-01-1945   DATE OF ADMISSION:  11/29/2006  DATE OF DISCHARGE:  11/30/2006                               DISCHARGE SUMMARY   DISCHARGE DIAGNOSES:  1. Noncardiac chest pain - myocardial infarction ruled out.  2. History of Barrett's esophagus.  3. History of antiphospholipid antibody.  4. Chronic Coumadin therapy.  5. Hypothyroidism.  6. History of peripheral emboli secondary to antiphospholipid antibody      and hypercoagulability.   DISCHARGE MEDICATIONS:  Synthroid 0.125 mg a day, Coumadin 8 mg a day,  Wellbutrin 200 mg p.o. b.i.d. calcium, and magnesium once a day, aspirin  81 mg a day, Ambien and Vicodin as needed, Protonix 40 mg a day or over-  the-counter Prilosec 20 mg a day.   DISPOSITION:  The patient will see Dr. Elease Hashimoto early next week for an  outpatient stress Cardiolite study.  She is to follow-up with Dr.  Cyndie Chime for her pro times and will follow-up with Dr. Larina Bras as  needed.   HISTORY:  Ms. Zani is a 65 year old female with a history of chest  pains in the past.  She was admitted for further evaluation of these  chest pains.  Please see dictated H&P for further details.   HOSPITAL COURSE PER PROBLEMS:  1. Chest pain.  The patient ruled out for myocardial infarction.  She      had no EKG changes.  She is discharged in satisfactory condition.      We will plan on doing a stress Cardiolite study early next week for      [name] to evaluate her further.  She has been told to call us back if      she has any further episodes of chest pain.  2. History of antiphospholipid antibody with chronic anticoagulation.      The patient's protime is therapeutic.  This will continue to be      followed by Dr. Cyndie Chime.  All other medical  problems stable.      She will follow up with her other medical doctors as needed.  Nov 29, 2006           ______________________________  Vesta Mixer, M.D.     PJN/MEDQ  D:  11/30/2006  T:  11/30/2006  Job:  [id number]   cc:   Georgiana Spinner, M.D.  Genene Churn. Cyndie Chime, M.D.  Ace Gins, MD

## 2010-12-08 NOTE — Discharge Summary (Signed)
NAMEKATARZYNA, [name]              ACCOUNT NO.:  [id number]   MEDICAL RECORD NO.:  [id number]          PATIENT TYPE:  OBV   LOCATION:  3036                         FACILITY:  [facility]   PHYSICIAN:  Monte Fantasia, MD  DATE OF BIRTH:  1946-06-24   DATE OF ADMISSION:  11/24/2008  DATE OF DISCHARGE:  11/26/2008                               DISCHARGE SUMMARY   ADDENDUM:  Please refer to the prior discharge summary dictated on Nov 25, 2008.  This is an addendum to the same.  The patient had a TIA  episode secondary to her lupus anticoagulant syndrome.  The patient has  been on Coumadin and aspirin and INR is therapeutic at 2.7.  The patient  follows up with Dr. Cyndie Chime for the Coumadin therapy and has an  appointment for the Coumadin therapy followup.  The patient at present  is medically stable to be discharged and can be discharged home.  Will  continue on Coumadin of 8 mg daily.  2-D echo and carotid Dopplers have  been normal.  Carotid Dopplers showed no ICA stenosis.  The patient at  present is medically stable to be discharged and can be discharged home.  There has been no change in the discharge diagnosis and the discharge  medications dictated in the prior discharge summary.      Monte Fantasia, MD  Electronically Signed     MP/MEDQ  D:  11/26/2008  T:  11/27/2008  Job:  [id number]

## 2010-12-08 NOTE — H&P (Signed)
NAMESTUTI, [name]              ACCOUNT NO.:  [id number]   MEDICAL RECORD NO.:  [id number]          PATIENT TYPE:  INP   LOCATION:  3036                         FACILITY:  [facility]   PHYSICIAN:  Della Goo, M.D. DATE OF BIRTH:  Jun 06, 1946   DATE OF ADMISSION:  11/24/2008  DATE OF DISCHARGE:                              HISTORY & PHYSICAL   PRIMARY CARE PHYSICIAN:  Production assistant, radio.   CHIEF COMPLAINT:  Left-sided numbness.   HISTORY OF PRESENT ILLNESS:  This is a 65 year old female who presents  to the emergency department secondary to complaints of acute onset of  numbness which started in her left leg and began to rise up the left  side of her body into her left arm.  She also reports having shooting  pains in the chest as well as headache.  She stated the discomfort  started about 3 hours prior to arrival.  She does report that the  symptoms have started improving slightly.  She denies having any  syncope, seizure activity or tremors.   PAST MEDICAL HISTORY:  Significant for:  1. History of breast cancer of the right breast, status post      lumpectomy with radiation and chemotherapy treatment.  2. Fibromyalgia.  3. Lupus anticoagulant.  4. Gastroesophageal reflux disease.  5. Depression.  6. 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  7. Hypothyroidism.   PAST SURGICAL HISTORY:  Also includes a thyroidectomy and a  hysterectomy.   MEDICATIONS:  Synthroid, Coumadin, Wellbutrin, aspirin, Crestor, Ambien,  omeprazole, Neurontin.   ALLERGIES:  1. DAYPRO, LEVAQUIN, SULFA which all cause rash.  2. The patient also has an allergy to VITAMIN K ANALOG.  She states      that she coded when she had this medication given before.   SOCIAL HISTORY:  The patient is a nonsmoker, nondrinker.   FAMILY HISTORY:  Negative for coronary artery disease, hypertension,  diabetes or cancer.   PHYSICAL EXAMINATION FINDINGS:  This is a 65 year old obese female in  discomfort but no acute distress.  VITAL SIGNS:  Temperature 97.0, blood pressure 171/82, heart rate 78,  respirations 18, O2 saturations 99%.  HEENT:  Normocephalic, atraumatic.  Pupils equally round, reactive to  light.  Extraocular movements are intact funduscopic benign.  Nares are  patent bilaterally.  Oropharynx is clear.  NECK:  Supple.  Full range of motion.  No adenopathy.  CARDIOVASCULAR:  Regular rate and rhythm.  No murmurs, gallops, rubs.  LUNGS:  Clear to auscultation bilaterally.  ABDOMEN:  Positive bowel sounds, soft, nontender, nondistended.  EXTREMITIES:  Without cyanosis, clubbing or edema.  NEUROLOGIC:  Patient is alert and oriented x3.  Cranial nerves are  intact.  Motor and sensory function also intact.  There is no pronator  drifting of the upper extremities.  Deep tendon reflexes are 2 of 4 and  Babinski signs are normal.   LABORATORY STUDIES:  White blood cell count 6.7, hemoglobin 12.9,  hematocrit 36.7, platelets 186, neutrophils 57% lymphocytes 33%.  Sodium  140, potassium 3.7, chloride 106, bicarbonate 26, BUN 18, creatinine  1.06 and glucose 108.  Prothrombin time  28.4, INR 2.4, PTT 47.  CT scan  of the head negative for any acute intracranial findings.  Point of care  cardiac markers with a myoglobin of 96.8, CK-MB 1.7, troponin less than  0.05.  electrocardiogram reveals a normal sinus rhythm without acute ST-  segment changes.   ASSESSMENT:  A 65 year old female being admitted with:  1. Cerebrovascular accident versus transient ischemic attack.  2. Elevated blood pressure.  3. Fibromyalgia.  4. 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  5. Lupus anticoagulant.   PLAN:  The patient will be admitted to a telemetry area.  Cardiac  enzymes will be performed and a CVA workup will be started, an MRI, MRA  study of the brain has been ordered along with a carotid ultrasound  study and a 2-D echocardiogram.  The patient will continue on her  Coumadin  therapy which is currently therapeutic, this will be adjusted  as needed.  GI prophylaxis has also been ordered.  The patient will also  be placed on neurologic checks to evaluate for further changes.  Further  workup will ensue pending results of her studies and her clinical  course.      Della Goo, M.D.  Electronically Signed     HJ/MEDQ  D:  11/24/2008  T:  11/24/2008  Job:  [id number]

## 2010-12-10 ENCOUNTER — Ambulatory Visit
Admission: RE | Admit: 2010-12-10 | Discharge: 2010-12-10 | Disposition: A | Discharge status: Home / Self Care | Payer: Medicare Other | Source: Ambulatory Visit | Attending: Oncology | Admitting: Oncology

## 2010-12-10 ENCOUNTER — Other Ambulatory Visit: Payer: Self-pay | Admitting: Oncology

## 2010-12-10 ENCOUNTER — Ambulatory Visit: Payer: Medicare Other

## 2010-12-10 DIAGNOSIS — Z1231 Encounter for screening mammogram for malignant neoplasm of breast: Secondary | ICD-10-CM

## 2010-12-10 DIAGNOSIS — N644 Mastodynia: Secondary | ICD-10-CM

## 2010-12-11 ENCOUNTER — Ambulatory Visit
Admission: RE | Admit: 2010-12-11 | Discharge: 2010-12-11 | Disposition: A | Discharge status: Home / Self Care | Payer: Medicare Other | Source: Ambulatory Visit | Attending: Oncology | Admitting: Oncology

## 2010-12-11 DIAGNOSIS — Z9889 Other specified postprocedural states: Secondary | ICD-10-CM

## 2010-12-11 MED ORDER — GADOBENATE DIMEGLUMINE 529 MG/ML IV SOLN
15.0000 mL | Freq: Once | INTRAVENOUS | Status: AC | PRN
  Administered 2010-12-11: 15 mL via INTRAVENOUS

## 2010-12-11 NOTE — Procedures (Signed)
[facility]  Patient:    NEETI, KNUDTSON                     MRN: [id number] Proc. Date: 06/28/00 Adm. Date:  [id number] Attending:  Sabino Gasser CC:         Tammy R. Collins Scotland, M.D.  Genene Churn. Cyndie Chime, M.D.   Procedure Report  PROCEDURE:  Upper Endoscopy with biopsy.  GASTROENTEROLOGIST:  Sabino Gasser, M.D.  INDICATIONS:  Barretts esophagus.  ANESTHESIA:  Demerol 80 mg, Versed 7 mg.  PROCEDURE IN DETAIL:  With the patient mildly sedated and in the left lateral decubitus position, the Olympus video endoscope was inserted in the mouth, passed under direct vision through the esophagus where Barretts esophagus was seen, photographed, and biopsied.  We entered the stomach.  Fundus and body, antrum, duodenal bulb, and second portion of the duodenum were all visualized and appeared normal.  The endoscope was then slowly withdrawn, taking circumferential views of entire duodenal mucosa until the endoscope had been pulled back into the stomach, placed in retroflexion to view the stomach from below, and this showed a hiatal hernia which was photographed.  The endoscope was then straightened and withdrawn, taking circumferential views of remaining gastric and esophageal mucosa which otherwise appeared normal.  The patients vital signs and pulse oximetry remained stable.  The patient tolerated the procedure well with no apparent complications.  FINDINGS:  Endoscopic Barretts esophagus.  Await biopsy report.  PLAN:  The patient will call me for results and follow up with me as an outpatient. DD:  06/28/00 TD:  06/28/00 Job: [id number]

## 2010-12-11 NOTE — Op Note (Signed)
[place]. [facility]  Patient:    SEMONE, ORLOV                     MRN: [id number] Proc. Date: 11/10/00 Adm. Date:  [id number] Attending:  Ok Anis CC:         Genene Churn. Cyndie Chime, M.D.   Operative Report  PREOPERATIVE DIAGNOSIS:  History of right breast cancer with right breast mass.  POSTOPERATIVE DIAGNOSIS:  Organized hematoma.  OPERATION PERFORMED:  Excisional right breast biopsy.  SURGEON:  Stephenie Acres, M.D.  ANESTHESIA:  General.  DESCRIPTION OF PROCEDURE:  The patient was taken to the operating room and placed in supine position.  After adequate anesthesia was induced using laryngeal mask, the right breast was prepped and draped in normal sterile fashion.  Using the previous incision in the upper inner quadrant of the right breast, I dissected down through subcutaneous fat and breast tissue onto a firm palpable mass.  An incision was made in the mass and an organized hematoma was identified and evacuated.  The anterior wall of the hematoma was excised and sent for pathologic evaluation.  Further dissection of the posterior wall of the hematoma was not undertaken because of the patients need for postoperative anticoagulation.  At this point after adequate hemostasis was ensured.  The skin was closed with subcuticular 4-0 Monocryl. Steri-Strips and sterile dressings were applied.  The patient tolerated the procedure well and went to PACU in good condition. DD:  11/13/00 TD:  11/14/00 Job: [id number]

## 2010-12-11 NOTE — H&P (Signed)
[place]. [facility]  Patient:    [name], [name]                     MRN: [id number] Adm. Date:  [id number] Attending:  Jim Desanctis CC:         [facility]   History and Physical  REASON FOR ADMISSION: 1. Numbness on the right side of the face. 2. Antiphospholipid antibody syndrome. 3. History of stage 1 breast cancer. 4. Hypothyroidism.  HISTORY OF PRESENT ILLNESS:  The patient is a 65 year old white female with antiphospholipid antibody syndrome.  She is on Coumadin and aspirin.  Today, her prothrombin time was 2.17 seconds.  Her Coumadin has been fairly well regulated.  She began experiencing numbness of the face starting Wednesday.  This has gotten worse.  She is not having any difficulties chewing or swallowing. There has been dizziness or any speech difficulties.  She has had no fevers, sweats or chills.  There has been no bleeding.  She does complain of a headache that started a couple of days ago.  She has resolved.  She came to the office today.  Her prothrombin time was therapeutic at 2.17 seconds.  However, she has clear and obvious sensory deficits on the right side of her body and face. She is now being admitted for evaluation.  PAST MEDICAL HISTORY:  Hypothyroidism.  ALLERGIES:  IV dye.  Sulfa.  Daypro.  Levaquin.  MEDICATIONS:  Coumadin, aspirin and Synthroid.  Her Coumadin dose is 7 mg p.o. q.d.  PHYSICAL EXAMINATION:  GENERAL:  Obese white female in no obvious distress.  Alert and oriented x 3.  VITAL SIGNS:  Temperature 98.1, pulse 98, respiratory rate 20, blood pressure 138/92, weight 211.  HEAD AND NECK:  No ocular or oral lesions.  There are no palpable cervical or supraclavicular lymph nodes.  Extraocular movements intact.  Good gag reflex. Tongue is midline.  She has intact cranial nerves bilaterally.  LUNGS:  Clear bilaterally.  CARDIAC:  regular rate and rhythm with occasional extra beat.   There is no irregular heart rate.  ABDOMEN:  Soft with good bowel sounds.  No palpable abdominal mass.  No palpable hepatosplenomegaly.  EXTREMITIES:   No clubbing, cyanosis, or edema.  Good range of motion of her joints.  Muscle strength is 4+/5 bilaterally.  NEUROLOGIC:   Sensory nerve deficits over the right face, body and lower extremities.  ______ seem to be ______ to the same things.  PLAN:  We will go ahead and admit her.  We will start her on IV heparin.  I ma worried about her having a TIA versus stroke in evolution versus a massive thrombotic process.  Because she has no neurologic findings outside of numbness, hopefully, will bode well for her.  I will have to watch her very closely. DD:  12/23/00 TD:  12/23/00 Job: [id number]

## 2010-12-11 NOTE — H&P (Signed)
[place]. [facility]  Patient:    [name], [name]                     MRN: [id number] Adm. Date:  [id number] Attending:  Ok Anis CC:         Genene Churn. Cyndie Chime, M.D.  Tammy R. Collins Scotland, M.D.  Katherine Roan, M.D./Robert Elson Areas, M.D., [facility]  Vesta Mixer, Montez Hageman., M.D.   History and Physical  CHIEF COMPLAINT: The patient is a 65 year old female, a patient of Dr. Sofie Hartigan, seen for possible coagulopathy.  HISTORY OF PRESENT ILLNESS: Past medical history is positive for stage 1 T1No ductal carcinoma, DCIS, of the right breast, status post right lumpectomy and sentinel node biopsy on August 14, 1998 by Dr. Lydia Guiles. Mardella Layman.  She was nodal negative.  There was only one focus in the specimen showing invasive breast cancer and the rest was in situ.  She was treated with 5100 RADS of radiation therapy from September 22, 1998 to November 05, 1998 by Dr. Dayton Scrape at [facility].  She has also been treated with tamoxifen, which she remains on.  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  Negative for cardiac source, initially worked up by Dr. Elease Hashimoto and placed on Coumadin.  Later she was evaluated and followed by Dr. Cyndie Chime.  She says that there is a question of whether or not she may have lupus anticoagulant.  She has not had any other peripheral emboli and has been followed on a monthly PT check through Dr. Cyndie Chime.  She currently takes Coumadin 8 mg q.d. five days a week and 10 mg a day two days a week.  She called me today at work at [employer] and stated she was having bilateral leg pain, left greater than right, and had a bruise behind her left knee.  No prior history of leg clotting, no hand symptoms recently.  She did notice, again, a bruise with no trauma.  She also complained of occasional neck and left upper chest pain.  I  subsequently had her brought to the emergency room for evaluation and found that she was hypercoagulated and subsequently admitted her.  PAST MEDICAL HISTORY:  1. Thyroidectomy in 1975 and 1977 for "goiter".  2. Right oophorectomy in 1980.  3. Left oophorectomy in 1982.  ALLERGIES:  1. SULFA caused hives.  2. DAYPRO caused hives.  3. LEVAQUIN caused hives.  FAMILY HISTORY: Negative for malignancy, negative for coagulopathy.  SOCIAL HISTORY: She works at [employer].  No alcohol or tobacco use.  REVIEW OF SYSTEMS: Basically as above.  She has really not complained of any fever or chills.  No cough.  No blood in stool or urine.  No oral bleeding.  PHYSICAL EXAMINATION:  VITAL SIGNS: Blood pressure 160/94, pulse 106, respirations 16, temperature 98 degrees.  Room air oxygen saturation 98%.  HEENT: Oral mucosa does show on the hard palate some petechiae.  No gross bleeding of the gums.  NECK: No cervical, supraclavicular, or axillary adenopathy.  HEART: S1 greater than S2 without rubs or gallops.  LUNGS: Decreased breath sounds bilateral.  No rales or rhonchi or wheezes.  ABDOMEN:  No hepatosplenomegaly.  Bowel sounds active.  No inguinal adenopathy.  EXTREMITIES: Both hands look normal today.  I do not see any cyanosis or her fingertips on either side.  She does have a bruise present, ecchymotic area, plus probably  an element of hematoma to the lateral aspect of her popliteal fossa on the left.  There is no tenderness in either calf area.  No ankle edema per se.  Both feet are good and warm.  I do not see any bleeding but I do see some small petechiae scattering over both lower extremities.  LABORATORY DATA: WBC 8.3, hemoglobin 13.3, platelet count 230,000; ANC 4600. PT 34.9 seconds.  INR 6.6.  PTT 61 seconds.  Pending is a metabolic C panel.  EKG shows normal sinus rhythm at 89 with no ischemic changes.  Chest x-ray is negative for any acute findings.  ASSESSMENT:  Overcoagulation with Coumadin for coagulopathy.  PLAN: Will give vitamin K and hold her Coumadin, and watch for clinical bleeding.  Hopefully this will resolve itself over the next couple of days. DD:  04/17/00 TD:  04/18/00 Job: [id number]

## 2010-12-11 NOTE — H&P (Signed)
[name], MOMON NO.:  [id number]   MEDICAL RECORD NO.:  [id number]          PATIENT TYPE:  INP   LOCATION:  2915                         FACILITY:  [facility]   PHYSICIAN:  Peter M. Swaziland, M.D.  DATE OF BIRTH:  06-25-1946   DATE OF ADMISSION:  07/08/2005  DATE OF DISCHARGE:                                HISTORY & PHYSICAL   HISTORY OF PRESENT ILLNESS:  [name] is a 65 year old white female who  was transferred from [facility] for evaluation of chest pain.  The  patient states her chest pain started last evening.  It is described as a  heavy ball sitting in the center of her chest, radiating to her left  shoulder and arm with tingling down her left arm to her fingertips.  She  also describes an electric sensation in her left posterior thorax.  She  denies any dysphagia.  She has had some mild nausea.  She has had vague  shortness of breath.  No diaphoresis.  She states she had similar symptoms  four weeks and two weeks ago but not as severe and these episodes lasted  only a couple of hours.  The patient was evaluated here in the past.  She  had a normal cardiac catheterization in 1998 and had a normal stress  Cardiolite study one year ago.  She does have a history of antiphospholipid  antibody syndrome with a history of DVT and pulmonary embolus.  She is on  chronic Coumadin therapy.   PAST MEDICAL HISTORY:  1.  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  2.  Hypothyroidism.  3.  Breast cancer.  4.  History of Barrett esophagus.   ALLERGIES:  1.  SULFA.  2.  LEVAQUIN.  3.  DAYPRO.  4.  VITAMIN K.   MEDICATIONS:  1.  Coumadin 10 mg on Mondays and Thursdays.  2.  Synthroid 0.125 mg daily.  3.  Wellbutrin 200 mg b.i.d.  4.  Aspirin 81 mg per day.   SOCIAL HISTORY:  The patient is single.  She has two children.  She denies  tobacco or alcohol use.   FAMILY HISTORY:  Positive for coronary disease  in her mother.   REVIEW OF SYSTEMS:  Otherwise unremarkable.  She has had no calf swelling or  tenderness.  No history of TIA or stroke.  No recent bowel or bladder  complaints.   PHYSICAL EXAMINATION:  GENERAL:  The patient is a middle-aged white female  in no apparent distress.  VITAL SIGNS:  Blood pressure 140/65, pulse 70 and regular.  She is afebrile.  Sats were 100% on 2 liters nasal cannula.  HEENT:  Normocephalic, atraumatic.  Pupils equal, round, reactive to light  and accommodation.  Extraocular movements are full.  Oropharynx is clear.  NECK:  Supple without JVD, adenopathy, thyromegaly, or bruits.  LUNGS:  Clear to auscultation and percussion.  CARDIAC:  Reveals a regular rate and rhythm without gallop, murmur, rub, or  click.  Her chest wall does demonstrate mild tenderness to palpation in  the  left pectoral region.  ABDOMEN:  Soft, nontender without masses or hepatosplenomegaly.  Femoral and  pedal pulses are 2+ and symmetric.  EXTREMITIES:  Calves are nontender.  There are no cords.  There is no edema  or cyanosis.  NEUROLOGIC:  Nonfocal.   LABORATORY DATA:  ECG from  is normal.  INR was 2.5.  Cardiac  enzymes were reported normal by ED physician.   IMPRESSION:  1.  Atypical chest pain, doubt cardiac etiology.  Need to rule out pulmonary      embolus.  I suspect this is more of a musculoskeletal pain, possibly      some esophageal component.  2.  Antiphospholipid antibody syndrome.  3.  Barrett esophagus.  4.  History of breast cancer.   PLAN:  1.  We will admit and rule out for myocardial infarction with serial cardiac      enzymes and ECG.  2.  She will be treated with IV nitroglycerin initially and analgesics.  3.  We will also obtain a CT of the chest with contrast.           ______________________________  Peter M. Swaziland, M.D.     PMJ/MEDQ  D:  07/08/2005  T:  07/09/2005  Job:  [id number]   cc:   Vesta Mixer, M.D.  Fax: [phone]    Genene Churn. Cyndie Chime, M.D.  Fax: [phone]   Evelena Peat, M.D.  Fax: [phone]

## 2010-12-11 NOTE — Op Note (Signed)
NAMEMCKENZEE, BEEM              ACCOUNT NO.:  [id number]   MEDICAL RECORD NO.:  [id number]          PATIENT TYPE:  AMB   LOCATION:  DSC                          FACILITY:  [facility]   PHYSICIAN:  Rodney A. Mortenson, M.D.DATE OF BIRTH:  [birth year]   DATE OF PROCEDURE:  12/30/2005  DATE OF DISCHARGE:                                 OPERATIVE REPORT   JUSTIFICATION:  A 65 year old female with hypercoagulability state.  Two  weeks ago was stepping out to plug in a lamp, caught her foot on the table  and twisted her knee.  She felt her kneecap slide laterally, but it did not  dislocate.  She had some pain.  She was seen and independent x-rays taken  with no abnormalities.  Patient was doing well until several days prior to  first office visit when she got up out of a chair and had sudden pain and  then marked swelling about the knee.  There was 90 cc of blood removed from  the knee.  There was diffuse tenderness throughout the knee.  It was felt  she needed arthroscopic evaluation.  MRI was not done preoperatively.   Complications discussed preoperatively.  Questions answered and encouraged.   JUSTIFICATION OF OUTPATIENT SURGERY:  Minimal morbidity.   PREOPERATIVE DIAGNOSIS:  Acute hemarthrosis right knee with high probability  internal derangement right knee.   POSTOPERATIVE DIAGNOSIS:  Chondromalacia medial patella, facet patella,  large full thickness chondral fracture lateral patella facet with exposed  bone.   PROCEDURE:  Arthroscopic evaluation; chondroplasty medial patellar facet,  debridement lateral patellar facet and removal of loose cartilage.   SURGEON:  Lenard Galloway. Chaney Malling, M.D.   ANESTHESIA:  MAC.   PATHOLOGY:  With the arthroscope in the knee, very careful examination was  undertaken.  The patella was visualized first.  There was grade 2 and grade  3 chondromalacia about the medial patellar facet, but there was a huge full  thickness tear of all the articular  cartilage over the lateral patellar  facet.  Raw bone was exposed and shards of cartilage were hanging around the  margins.  The femoral notch is visualized and this is normal.  ACL was  normal.  Articular cartilage over both femoral condyles and both tibial  plateaus and the entire circumference of both medial and lateral meniscus  were carefully examined.  There was pathology in this part of the knee.   PROCEDURE:  Patient placed on operating table in the supine position with  the pneumatic tourniquet about the right upper thigh.  The right leg was  placed in leg holder and the entire right lower extremity prepped with  DuraPrep and Betadine and draped out in the usual manner.  Marcaine had been  placed in the knee, and Xylocaine and epinephrine used to infiltrate the  puncture wounds.  Infusion cannula was placed in superomedial pouch and knee  distended with saline.  Anteromedial and anterolateral portals were made and  the arthroscope was introduced.  All the pathology was seen in the posterior  aspect of the patella.  Through both portals the medial patellar  facet was  debrided with a chondroplastic shaver.  Once this was completed to my  satisfaction, we turned to the lateral patellar facet.  Again, there was a  huge chondral defect with exposed bone.  The margins were debrided with a  series of baskets through both portals and this was followed with the  chondroplastic shaver.  All debris was removed.  The remaining rim was then  smoothed and balanced with nice transition from normal cartilage to the  large bony defect, or exposed bone.  Marcaine was then placed in the knee  and a large bulky pressure dressing was applied.  The patient then returned  to recovery room in excellent condition. __________.   FOLLOW UP CARE:  1.  To my office on Wednesday.  2.  Vicodin for pain.  3.  She is to start back on her Coumadin tonight, have her pro-time drawn in      the morning and  probably placed on Lovenox.  4.  Copy of this note to Dr. Cyndie Chime.           ______________________________  Lenard Galloway. Chaney Malling, M.D.     RAM/MEDQ  D:  12/30/2005  T:  12/31/2005  Job:  [id number]

## 2010-12-11 NOTE — Op Note (Signed)
NAMEWILLEAN, [name]              ACCOUNT NO.:  [id number]   MEDICAL RECORD NO.:  [id number]          PATIENT TYPE:  AMB   LOCATION:  ENDO                         FACILITY:  [facility]   PHYSICIAN:  Georgiana Spinner, M.D.    DATE OF BIRTH:  1945-09-13   DATE OF PROCEDURE:  09/12/2006  DATE OF DISCHARGE:                               OPERATIVE REPORT   SURGEON:  Georgiana Spinner, M.D.   PROCEDURE:  Upper endoscopy.   INDICATIONS:  GERD, question of Barrett esophagus.   ANESTHESIA:  Demerol 60, Versed 6 mg.   PROCEDURE:  With the patient mildly sedated in the left lateral  decubitus position, the Pentax videoscopic endoscope was inserted in the  mouth and passed under direct vision through the esophagus, which  appeared normal.  There was no clear-cut evidence of Barrett esophagus  to me, but the distal esophagus was photographed and biopsied around the  squamocolumnar junction.  Entered into the stomach.  The fundus, body,  antrum, duodenal bulb and second portion of the duodenum were  visualized.  From this point, the endoscope was slowly withdrawn, taking  circumferential views of the duodenal mucosa, until the endoscope had  been pulled back into the stomach and placed in retroflexion to view the  stomach from below.  The endoscope was straightened and withdrawn,  taking circumferential views of the remaining gastric and esophageal  mucosa.  The patient's vital signs and pulse oximetry remained stable.  The patient tolerated the procedure well without apparent complications.   FINDINGS:  Biopsy of distal esophagus was done; await biopsy report.   PLAN:  The patient will call me for results and follow up with me as an  outpatient.           ______________________________  Georgiana Spinner, M.D.     GMO/MEDQ  D:  09/12/2006  T:  09/13/2006  Job:  [id number]

## 2010-12-11 NOTE — Op Note (Signed)
[name], CRUSOE              ACCOUNT NO.:  [id number]   MEDICAL RECORD NO.:  [id number]          PATIENT TYPE:  AMB   LOCATION:  ENDO                         FACILITY:  [facility]   PHYSICIAN:  Georgiana Spinner, M.D.    DATE OF BIRTH:  Apr 25, 1946   DATE OF PROCEDURE:  09/11/2004  DATE OF DISCHARGE:                                 OPERATIVE REPORT   PROCEDURE:  Colonoscopy.   INDICATIONS:  Colon polyps.   ANESTHESIA:  Demerol 20, Versed 2 mg.   PROCEDURE:  With the patient mildly sedated in the left lateral decubitus  position, the Olympus videoscopic colonoscope was inserted in the rectum and  passed under direct vision to the cecum identified by ileocecal valve and  appendiceal orifice both of which were photographed. From this point,  colonoscope was slowly withdrawn taking circumferential views of colonic  mucosa stopping in the rectum which appeared normal in direct and showed  hemorrhoids on retroflexed view. The endoscope was straightened, withdrawn.  The patient's vital signs and pulse oximeter remained stable. The patient  tolerated procedure well without apparent complications.   FINDINGS:  Internal hemorrhoids, otherwise an unremarkable colonoscopic  examination to the cecum.   PLAN:  See endoscopy note for further details and have patient repeat  examination due to family history of colon cancer in approximately 5 years.      GMO/MEDQ  D:  09/11/2004  T:  09/11/2004  Job:  [id number]

## 2010-12-11 NOTE — Discharge Summary (Signed)
[place]  Patient:    [name], [name]                     MRN: [id number] Adm. Date:  [id number] Disc. Date: [id number] Attending:  Ok Anis CC:         Tammy R. Collins Scotland, M.D.  Alvia Grove., M.D.   Discharge Summary  HISTORY OF PRESENT ILLNESS:  Complicated 65 year old woman who initially came to my attention about one year ago when she developed arterial emboli to her digits.  There was a history of previous pulmonary embolus.  She was evaluated for a coagulopathy.  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  She has been maintained on chronic anticoagulation with Coumadin and low dose aspirin, and has not had any subsequent thrombotic events.  Prothrombin time is monitored closely in my office.  The most recent lab done at the beginning of August 2001, showed a prothrombin time in a good therapeutic range on current dose of Coumadin of 8 mg daily except for 10 mg on Mondays and Thursdays.  She presented on the day of the current admission with bilateral leg pain, left greater than right, and the sudden appearance of a large bruise behind her left knee.  She was evaluated by Dr. Lyndal Pulley.  Coumadin level was found to be much higher then the therapeutic range with a prothrombin time of 34.9 seconds and INR of 6.6, and she was admitted for further evaluation.  PHYSICAL EXAMINATION:  Initial exam by Dr. Lyndal Pulley:  VITAL SIGNS:  Pulse 106 regular, blood pressure 160/94, respirations 16, temperature 98.  HEENT:  There was some petechiae in the posterior pharynx.  LUNGS:  Decreased breath sounds over both lungs.  CARDIAC:  No cardiac murmurs.  ABDOMEN:  Soft.  EXTREMITIES:  No cyanosis of her extremities.  There was a linear ecchymosis over the area of the popliteal fossa.  No calf tenderness or swelling.  No neurologic deficits.  HOSPITAL COURSE:  She  was given a 10 mg dose of vitamin K.  CBC showed a baseline hemoglobin of 13.3, white count 8300, platelets 230,000.  About 12 hours later there was no change in the prothrombin time.  Due to my concern of her risk for bleeding, she was given 2 units of fresh frozen plasma. Subsequent prothrombin time was down in the low therapeutic range with an INR of 2.2.  Venous Doppler studies were done and confirmed presence of a hematoma in the area of the left popliteal fossa.  No evidence for any thrombosis. Hemoglobin fell slightly to 11.5 g.  There was no further extension of the hematoma and no new problems.  A repeat prothrombin time on day of discharge was 17.1 seconds with INR of 1.7.  I am putting her back on a 7.5 mg daily dose of Coumadin at this time.  I will send blood to the [facility] for a chromogenic factor Xa level in the near future to get a better correlation of her Coumadin needs.  On my exam there was a tender 2 x 2 cm area over the scar on her right breast where she previously had a lumpectomy for a stage I breast cancer.  There was no ecchymosis on the skin, but this palpable nodule was superficial and tender, and most likely represents a hematoma.  She just had mammograms one month ago which were unremarkable.  I elected to get a CT scan of her breast for better delineation of this area of abnormality.  This was done on the night prior to discharge and results are still outstanding.  LABORATORY DATA:  Additional data obtained during this admission:  Chemistry profile showed a random glucose of 193, mild elevation of SGOT 48 and SGPT 52, with normal LDH of 183.  A PTT was 61 seconds on admission.  Urinalysis was positive for hemoglobin and showed 11 to 20 red blood cells.  This was most likely due to the Coumadin over anticoagulation.  Stools for occult blood were negative.  A chest radiograph was normal.  An electrocardiogram was normal.  CONSULTATIONS:   None.  PROCEDURES: 1. Venous Doppler studies of the lower extremities. 2. Plasma transfusion to reverse coagulopathy. 3. CT scan of the right breast.  DIAGNOSES: 1. Coumadin over anticoagulation. 2. Soft tissue hemorrhage secondary to #1. 3. Antiphospholipid antibody syndrome. 4. History of previous pulmonary embolus and digital artery thrombosis. 5. Stage I breast cancer. 6. Hypothyroid, on replacement. 7. 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  DISPOSITION:  Condition stable for discharge.  ACTIVITY:  Resume regular activity.  DIET:  Resume regular diet.  FOLLOWUP:  With me in one week.  Repeat prothrombin time this Friday on April 22, 2000.  DISCHARGE MEDICATIONS: 1. Resume Coumadin at a dose of 7.5 mg daily. 2. Aspirin 81 mg daily. 3. Continue other medications, including Synthroid 0.112 mg daily, tamoxifen    10 mg two tablets q.a.m., and Tylox one or two q.6h. p.r.n. pain. DD:  04/19/00 TD:  04/19/00 Job: [id number]

## 2010-12-11 NOTE — Op Note (Signed)
[name], [name]                       ACCOUNT NO.:  [id number]   MEDICAL RECORD NO.:  [id number]                   PATIENT TYPE:  AMB   LOCATION:  ENDO                                 FACILITY:  [facility]   PHYSICIAN:  Georgiana Spinner, M.D.                 DATE OF BIRTH:  11/25/1945   DATE OF PROCEDURE:  DATE OF DISCHARGE:                                 OPERATIVE REPORT   PROCEDURE:  Upper endoscopy with dilation and biopsy.   INDICATIONS FOR PROCEDURE:  Dysphagia, question of Barrett's esophagus.   ANESTHESIA:  Demerol 100, Versed 8 mg.   DESCRIPTION OF PROCEDURE:  With the patient mildly sedated in the left  lateral decubitus position, the Olympus videoscopic endoscope was inserted  in the mouth and passed under direct vision through the esophagus which  appeared normal. In the distal esophagus, there was a questionable area of  Barrett's photographed. At first, we advanced into the stomach, the fundus,  body, antrum, duodenal bulb and second portion of the duodenum and all  appeared normal. From this point, the endoscope was slowly withdrawn taking  circumferential views of the entire duodenal mucosa until the endoscope was  then pulled back in the stomach, placed in retroflexion to view the stomach  from below. The endoscope was then straightened and a guidewire was passed  into the antrum. The endoscope was removed taking circumferential views of  the remaining gastric and esophageal mucosa. Once the endoscope was removed,  Savary dilators of 14 and 17 mm were passed with a fair amount of resistance  with each but no blood was seen on either dilator. With the latter, the  guidewire was removed, the endoscope was then reinserted back into the  stomach and once again the stomach and esophagus were viewed stopping only  at the GE junction where biopsies were taken to evaluate for possible  Barrett's esophagus. The endoscope was withdrawn.  The patient's vital signs  and  pulse oximeter remained stable. The patient tolerated the procedure well  without apparent complications.   FINDINGS:  Question of Barrett's esophagus. Esophagus dilated to 14 and 17  dilators. Biopsies taken.    PLAN:  Await biopsy report and clinical response to dilation. Will resume  Coumadin today and have the patient call me for results of biopsies and  followup with me as an outpatient.                                                Georgiana Spinner, M.D.    GMO/MEDQ  D:  04/06/2002  T:  04/06/2002  Job:  [id number]   cc:   Tammy R. Collins Scotland, M.D.   Genene Churn. Cyndie Chime, M.D.  [address] [facility]  [place]  [place] [address]  Fax: [phone]

## 2010-12-11 NOTE — Discharge Summary (Signed)
NAMESTEPHINE, [name]                        ACCOUNT NO.:  [id number]   MEDICAL RECORD NO.:  [id number]                   PATIENT TYPE:  INP   LOCATION:  6529                                 FACILITY:  [facility]   PHYSICIAN:  Vesta Mixer, M.D.              DATE OF BIRTH:  January 24, 1946   DATE OF ADMISSION:  04/09/2004  DATE OF DISCHARGE:  04/10/2004                                 DISCHARGE SUMMARY   DISCHARGE DIAGNOSES:  1.  Hypothyroidism.  2.  Antiphospholipid syndrome.  3.  History of breast cancer.  4.  Chronic anticoagulation secondary to a pulmonary emboli and deep vein      thrombosis.  5.  History of chest pain in the past with a normal heart catheterization.   DISCHARGE MEDICATIONS:  1.  Synthroid 0.125 mg a day.  2.  Aspirin 81 mg a day.  3.  Wellbutrin 200 mg twice a day as directed by her medical doctor.  4.  Coumadin as directed by Dr. Augustin Schooling mg a day.  5.  Protonix 40 mg a day.  6.  Ambien 10 mg q.h.s.   DISPOSITION:  The patient is discharged to a regular walking program.  She  will see Dr. Elease Hashimoto next week for a stress Cardiolite study.  She will call  for an appointment.   HISTORY:  Ms. [name] is a 65 year old female who was admitted to the  hospital with some episodes of chest pain, facial numbness, and radiation  down into her arm.  Please see dictated H&P for further details.   HOSPITAL COURSE:  Problem 1.  CARDIAC DISEASE:  The patient ruled out for  myocardial infarction.  She had negative cardiac enzymes and her EKG  remained normal.  She had lower extremity duplex scan.  There was no  evidence of a DVT.  She was found to have a small hematoma but it was  isolated and it was not associated with any specific vasculature.   Her D-dimer level was normal.  Cardiac enzymes were normal.  The patient  will follow up with Dr. Elease Hashimoto for a stress Cardiolite study next week.   Problem 2.  ANEMIA:  The patient's hematocrit on the day of discharge  was  noted to be 34%.  She will follow up with Dr. Cyndie Chime for this mild  anemia.   Problem 3.  HYPOTHYROIDISM:  The patient will follow up with the [facility].       PJN/MEDQ  D:  04/10/2004  T:  04/11/2004  Job:  [id number]   cc:   Genene Churn. Cyndie Chime, M.D.  [address] [facility]  [place]  [place] [address]  Fax: [phone]   [facility]

## 2010-12-11 NOTE — H&P (Signed)
NAME:  [name], [name]                        ACCOUNT NO.:  [id number]   MEDICAL RECORD NO.:  [id number]                   PATIENT TYPE:  INP   LOCATION:  1824                                 FACILITY:  [facility]   PHYSICIAN:  Elmore Guise., M.D.           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  The patient reports  pain starting last p.m. while she was lying down getting ready to go to  sleep.  Describes the pain as anterior squeezing radiating to her neck and  left arm.  Since that time she has noticed increased frequency as well as  duration of her pain coming on with and without activity.  She denies any  significant orthopnea, paroxysmal nocturnal dyspnea, lower extremity edema  or syncope.   REVIEW OF SYSTEMS:  Positive for swelling in the left leg above her knee  associated with a bruise at the area and very tender to touch.  Denies  cough, fever, chills, nausea, vomiting, diarrhea.  All other review of  systems are negative.   CURRENT MEDICATIONS:  1.  Coumadin.  2.  Synthroid.  3.  Aspirin.  4.  Wellbutrin.  5.  Prednisone.  6.  Ambien on a p.r.n. basis.   ALLERGIES:  SULFA, VITAMIN K, LEVAQUIN AND DAYPRO.   FAMILY HISTORY:  Positive for coronary artery disease in her mother and  _______disease in her sister.   SOCIAL HISTORY:  She is single, has 2 children. No tobacco or alcohol.   PHYSICAL EXAMINATION:  VITAL SIGNS:  Temperature is 97.3, blood pressure is  126/84, pulse is 76, respirations are 16, O2 saturations are 100% on  room  air.  GENERAL:  She is a middle-aged white female alert and oriented x4, in no  acute distress.  NECK:  Supple, no lymphadenotomy, 2+ carotids.  No jugular venous  distension, no bruits.  She does have an old healed horizontal scar at the  lower neck corresponding to old thyroid surgery.  LUNGS:  Clear.  HEART:  Regular with normal S1, S2, no significant murmurs, gallops or rubs.  ABDOMEN:  Obese, soft, nontender, nondistended.  Positive bowel sounds, no  rebound or guarding.  EXTREMITIES:  Warm with 2+ pulses on the right. She has a boot on her left  leg secondary to plantar fasciitis.  She does have positive swelling and  ecchymosis above her left knee at the medial side which is very tender to  touch.   ECG shows normal sinus rhythm, 78 per minute, no significant  ST or T wave  changes noted.   LABORATORY DATA:  White count 6300, hemoglobin is 13.0, platelets 242,000.  Sodium 137, potassium 4.4, BUN and creatinine are 16 and 1.1.  Liver  function tests are normal.  Cardiac markers:  myoglobin is 55, troponin I  less than 0.05, MB was 1.1; second set MB was 1.8, troponin I 0.01, CPK was  105.  D-dimer was 0.38, INR was 2.8.   Lower extremity Dopplers were performed showing no evident of deep venous  thrombosis or Baker's cyst.  The patient does have soft tissue injury at the  left upper thigh on the medial border.  Chest x-ray is pending.   IMPRESSION:  1.  Chest pain.  2.  History of antiphospholipid syndrome.  3.  Hypothyroidism.   PLAN:  Will admit for observation.  Patient is currently chest pain free.  Continue serial cardiac enzymes.  The patient is already anticoagulated and  will continue her Coumadin at this time.  Will hold on CT scan for now to  evaluation for PE since the patient's saturations are 100%, her D-dimer is  negative and she has been anticoagulated.  Further work up pending her blood  work.  Dr. Elease Hashimoto will follow in the a.m.                                                 Elmore Guise., M.D.    TWK/MEDQ  D:  04/09/2004  T:  04/09/2004  Job:  [id number]

## 2010-12-11 NOTE — Discharge Summary (Signed)
[name], [name]              ACCOUNT NO.:  [id number]   MEDICAL RECORD NO.:  [id number]          PATIENT TYPE:  INP   LOCATION:  2915                         FACILITY:  [facility]   PHYSICIAN:  Vesta Mixer, M.D. DATE OF BIRTH:  [birth year]   DATE OF ADMISSION:  07/08/2005  DATE OF DISCHARGE:  07/09/2005                                 DISCHARGE SUMMARY   DISCHARGE DIAGNOSES:  1.  Noncardiac chest pain.  2.  Mild cervical disk disease.  3.  Hypothyroidism.  4.  History of deep vein thrombosis.  5.  Pulmonary emboli.   DISCHARGE MEDICATIONS:  1.  Coumadin 8 milligrams 5 days week with 10 milligrams 2 days week.  2.  Wellbutrin 200 milligrams a day.  3.  Synthroid 0.125 milligrams a day.  4.  Aspirin 81 milligrams a day.  5.  Protonix 40 milligrams a day.   DISPOSITION:  The patient will see Dr. Elease Hashimoto in a week or two. We may need  to do a stress Cardiolite study depending on if she has further chest pain.  She is to see Dr. Caryl Never as needed.   [name] is a 65 year old female with a history of chest pain. She has  had a normal heart catheterization in the past. She was admitted with  symptoms of chest pain. Please see dictated H&P for further details.   HOSPITAL COURSE:  Chest x-ray:  The patient ruled out for myocardial  infarction. She had no EKG changes. The following day she stated that she  had some neck pain with radiation to her arm. She had a MRI of the spine.  She was found to have a moderate-sized broad-based disk herniation between  T5 and T6. There was no compromise of the course of the spinal cord. There  was no evidence of metastatic disease.   The patient will follow-up with Dr. Caryl Never regarding this very mild  spinal abnormality. It does not appear that she needs surgery at this point  but she will certainly need this followed up. She has a history of chest  pains in the past and has had a normal heart catheterization in the past. I  would be  happy to do a stress Cardiolite study for complete evaluation of  the heart as needed. She is currently in satisfactory condition.           ______________________________  Vesta Mixer, M.D.    PJN/MEDQ  D:  07/09/2005  T:  07/10/2005  Job:  [id number]   cc:   Evelena Peat, M.D.  Fax: [phone]

## 2010-12-11 NOTE — H&P (Signed)
[place]. [facility]  Patient:    [name], [name]                     MRN: [id number] Adm. Date:  [id number] Disc. Date: [id number] Attending:  Lucile Shutters CC:         Tammy R. Collins Scotland, M.D.   History and Physical  CHIEF COMPLAINT:  Sudden onset of severe headache and subconjunctival hemorrhage.  HISTORY OF PRESENT ILLNESS:  This is a complicated 65 year old woman followed for antiphosphylipid antibody syndrome.  She presented with arterial emboli to her digits in October of 1998.  She has been on full dose anticoagulation with Coumadin since that time.  I have had great difficulty getting her on a stable dose.  She is very sensitive to the drug and is either subtherapeutic or significantly over the therapeutic range.  She was admitted here on September 23, when she developed spontaneous bruising of her leg.  Her INR was 6.6.  A venous Doppler study showed a large hematoma in the left popliteal fossa.  On physical examination there was a spontaneous hematoma in her right breast where she had a previous lumpectomy for stage I breast cancer.  Her Coumadin was reversed.  I have been trying to adjust the dose since that time.  I have been sending blood to [facility] for chromogenic Xa levels to use an independent test other than the prothrombin time test to assess adequacy of anticoagulation.  Most recent dose of Coumadin has been 10 mg.  INRs have been running 3.2.  Chromogenic Xa levels 24% with a target range of 20 to 35%.  She called our office this morning complaining of acute onset of right-sided frontoparietal headache which started yesterday.  This morning, she had an exacerbation of the headache and sudden onset of dizziness with coincidental development of a left subconjunctival hemorrhage.   Pro time in our office was 27.4 seconds with an INR of 5.4.  Due to my clinical concern that she may be sustaining an early intracranial hemorrhage, she is  admitted on an urgent basis for further evaluation and treatment.  Vitamin K and four units of fresh frozen plasma were given in my office prior to transfer to the hospital.  PAST MEDICAL HISTORY:  Surgery on her esophagus in July of 1998. Postoperative pneumonia.  Ischemic digits in October of 1998.  Emboli documented by angiography.  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  Thyrotoxicosis age 65, status post thyroidectomy.  Stage I cancer of the right breast diagnosed in December of 1999 treated with lumpectomy, sentinel lymph node dissection, and subsequent breast radiation and Tamoxifen.  Normal heart catheterization July 12, 1997, with normal left ventricular function.  Barretts esophagus by endoscopy with biopsy by Sabino Gasser, M.D. on June 28, 2000.  Normal colonoscopy to the cecum July 13, 1999.  Nissen fundoplication for GERD July of 1998 by Dr. Mardella Layman.  Migraine headaches.  MEDICATIONS: 1. Baby aspirin one daily. 2. Coumadin 10 mg daily. 3. Tamoxifen 20 mg daily. 4. Synthroid 0.112 mg daily.  ALLERGIES:  SULFA, DAYPRO, LEVAQUIN, IVP DYE, VITAMIN K - she had a severe local cutaneous reaction to vitamin K while in the hospital back in September. She had another reaction to a parenteral dose given in our office today with hypertension and acute onset of back pain.  FAMILY HISTORY:  Noncontributory.  SOCIAL HISTORY:  She is divorced.  Two grown children.  No alcohol or tobacco.  She works for [employer].  REVIEW OF SYSTEMS:  See HPI.  Currently no chest pain, chest pressure, palpitations, dyspnea.  She has had an intermittent cough.  No hemoptysis.  No sputum production.  No vomiting, no diarrhea, no melena.  She has noted some blood on the toilet tissue recently after a bowel movement.  She denied any hematuria, gum bleeding, or epistaxis.  PHYSICAL EXAMINATION:  GENERAL:  A well-nourished woman who is quite anxious.  VITAL SIGNS:   Blood pressure 135/88, pulse 104 regular, respirations 24, temperature 98.9.  SKIN:  Skin, hair, and nails are normal.  HEENT:  Pupils are equal, round and reactive to light.  Optic discs are sharp. There are no hemorrhages or exudates.  There is a prominent right subconjunctival hemorrhage.  Pharynx no erythema, exudate, or bleeding.  NECK:  Supple.  She is status post thyroidectomy.  Carotids are 2+ with no bruits.  LUNGS:  Clear.  Resonant to percussion.  Regular cardiac rhythm.  No murmur or gallop.  No lymphadenopathy.  BREASTS:  Not examined today.  Recent examination showed residual hematoma in the right breast and aspiration showed only blood and no recurrent tumor.  ABDOMEN:  Soft, nontender, no mass and no organomegaly.  RECTAL:  Not done.  PELVIC:  None done.  EXTREMITIES:  No edema, no calf tenderness.  NEUROLOGICAL:  Mental status intact.  Cranial nerves intact.  Coordination normal. Motor strength 5/5, reflexes 2+ symmetric.  LABORATORY DATA:  Hemoglobin 12.7 which is her baseline, hematocrit 37.5, white count 7200, platelets 199,000.  Pro time 27.4, INR 5.38 on Coumadin 10 mg daily.  IMPRESSION:  Acute onset of headache, dizziness, and right suconjunctival hemorrhage in a lady on chronic Coumadin.  INR is over 5.  I am concerned with the possibility of early intracranial hemorrhage.  PLAN:  She was given some vitamin K parenterally, but not a full dose due to an acute allergic reaction.  She was given four units of fresh frozen plasma. She will be admitted to the hospital for observation.  Urgent CT scan of the brain rule out hemorrhage.  It is clear that 10 mg of Coumadin is too much for her.  We will have to titrate her again which is always difficult after she receives some vitamin K to reverse the Coumadin.  She was consistently subtherapeutic on an 8 mg dose. DD:  08/02/00 TD:  08/02/00 Job: [id number]

## 2010-12-11 NOTE — Op Note (Signed)
   NAME:  [name], [name]                        ACCOUNT NO.:  [id number]   MEDICAL RECORD NO.:  [id number]                   PATIENT TYPE:  OIB   LOCATION:  NA                                   FACILITY:  [facility]   PHYSICIAN:  Vikki Ports, M.D.         DATE OF BIRTH:  07-04-1946   DATE OF PROCEDURE:  DATE OF DISCHARGE:                                 OPERATIVE REPORT   PREOPERATIVE DIAGNOSES:  History of right breast cancer, right breast mass.   POSTOPERATIVE DIAGNOSES:  History of right breast cancer, right breast mass.   PROCEDURE:  Excisional biopsy, right breast, lower inner quadrant.   SURGEON:  Vikki Ports, M.D.   ANESTHESIA:  General.   DESCRIPTION OF PROCEDURE:  The patient was taken to the operating room,  placed in the  supine position after adequate general anesthesia was  induced.  The right breast was prepped and draped in normal sterile fashion.  Using a curvilinear incision near the inframammary fold in the medial aspect  of the right breast, dissection was carried through skin and subcutaneous  tissue onto some firm, fatty nodularity which was excised at the medial most  extent down to the pectoralis fascia.  This was sent for frozen section,  which is currently pending. Adequate hemostasis was ensured.  Skin was  closed subcuticular 4-0 Monocryl.  Steri-Strips, sterile dressings were  applied.  The patient tolerated the procedure well, was sent to the PACU in  good condition.                                                  Vikki Ports, M.D.    KRH/MEDQ  D:  06/25/2002  T:  06/25/2002  Job:  [id number]   cc:   Oletta Lamas, M.D.

## 2010-12-11 NOTE — Op Note (Signed)
[name], [name]              ACCOUNT NO.:  [id number]   MEDICAL RECORD NO.:  [id number]          PATIENT TYPE:  AMB   LOCATION:  ENDO                         FAC[facility]   PHYSICIAN:  Georgiana Spinner, M.D.    DATE OF BIRTH:  1945/12/23   DATE OF PROCEDURE:  DATE OF DISCHARGE:                                 OPERATIVE REPORT   PROCEDURE:  Upper endoscopy.   INDICATIONS FOR PROCEDURE:  GERD.   ANESTHESIA:  Demerol 50, Versed 6 mg.   DESCRIPTION OF PROCEDURE:  With the patient mildly sedated in the left  lateral decubitus position, the Olympus videoscopic endoscope was inserted  in the mouth and passed under direct vision through the esophagus which  appeared normal until we reached the distal esophagus.  We photographed and  biopsied this area.  We entered into the stomach, fundus, body, antrum,  duodenal bulb, and second portion of the duodenum were visualized.  From  this point, the endoscope was slowly withdrawn, taking circumferential views  of the duodenal mucosa until the endoscope had been pulled back into the  stomach, placed in retroflexion, viewing the stomach from below.  The  endoscope was straightened and withdrawn, taking circumferential views of  the remaining gastric and esophageal mucosa.  The patient's vital signs and  pulse oximetry remained stable.  The patient tolerated the procedure well  without apparent complications.   FINDINGS:  Question of Barrett's esophagus, biopsied.   PLAN:  Await biopsy report.  The patient will call me for results and follow  up with me as an outpatient.      GMO/MEDQ  D:  09/11/2004  T:  09/11/2004  Job:  [id number]

## 2010-12-11 NOTE — Discharge Summary (Signed)
[place]. [facility]  Patient:    [name], [name]                     MRN: [id number] Adm. Date:  [id number] Disc. Date: 08/04/00 Attending:  Ok Anis CC:         Tammy R. Collins Scotland, M.D.   Discharge Summary  HISTORY OF PRESENT ILLNESS:  A rather complicated 65 year old woman with previous diagnosis of antiphospholipid antibody syndrome.  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  She has been on chronic Coumadin anticoagulation.  Her levels have been very difficult to stabilize.  She presented to the office on the day of the current admission with a 24-hour history of acute onset of severe right-sided frontoparietal headache, dizziness, and then spontaneous right conjunctival hemorrhage.  The quality of the headaches was different from her usual migraine headaches.  Labs in my office showed Coumadin over anticoagulation with pro time of 24.4 seconds and INR of 5.4.  I was concerned with impending intracranial hemorrhage.  She was admitted for rapid reversal of her Coumadin and observation.  PHYSICAL EXAMINATION:  The blood pressure was 135/88.  HEENT:  The pupils were equal and reactive to light.  Optic disks sharp.  No retinal hemorrhages or exudates.  There was a prominent right conjunctival hemorrhage.  NEUROLOGIC:  There were no focal neurologic deficits.  HOSPITAL COURSE:  The patient was given 5 mg of parenteral vitamin K in my office and had a reaction with acute onset of severe low back pain.  The total dose was not completed.  She was given four units of fresh frozen plasma in my office prior to transfer to the hospital to avoid any delay in reversing her Coumadin.  Once plasma had been infused, she underwent an emergent noncontrast CT scan of the brain which did not show any acute hemorrhage.  After 24 hours, there was still evidence of residual Coumadin effect and two additional units of fresh frozen plasma were given.   She continued to have a persistent severe right frontoparietal headache, although the subconjunctival hemorrhage was rapidly resolving at the time of discharge.  She did not develop any focal neurologic deficits.  Her hemoglobin, which was 12.7 in the office with a platelet count of 199,000, fell to 11.9 g and at discharge was 11.6 g.  No other obvious areas of bleeding.  A urinalysis showed 0-5 red blood cells. The pro time at the time of discharge was 15.5 seconds, normal up to 14.7. INR down to 1.4.  A platelet count fell to 140,000 for unexplained reasons.  A repeat CBC done on August 03, 2000, showed recovery of platelets to 174,000.  An electrocardiogram showed normal sinus rhythm with a very wavy background. Computer read cannot rule out inferior infarct, age undetermined.  I believe this is misleading.  No evidence for acute ischemic change or arrhythmia.  CONSULTATIONS:  None.  PROCEDURES:  Transfusion of fresh frozen plasma to reverse coagulopathy.  DIAGNOSES: 1. Impending intracranial hemorrhage secondary to Coumadin    over-anticoagulation. 2. Antiphospholipid antibody syndrome. 3. Hypothyroid on replacement. 4. Stage I breast cancer.  CONDITION ON DISCHARGE:  Stable, although she has a persistent headache. There are no focal neurologic deficits.  DISCHARGE MEDICATIONS:  She is instructed to hold her Tamoxifen and hold her Coumadin for an additional week and then resume it at at 5 mg dose.  She will continue with one baby aspirin.  Other medications to  include Synthroid 0.112 mg daily and Tylenol p.r.n. headache.  FOLLOW-UP:  Follow up in my office in 10 days.  Call for any interim problems. DD:  08/04/00 TD:  08/04/00 Job: [id number]

## 2010-12-11 NOTE — H&P (Signed)
[name], [name] NO.:  [id number]   MEDICAL RECORD NO.:  [id number]          PATIENT TYPE:  EMS   LOCATION:  MAJO                         FACILITY:  [facility]   PHYSICIAN:  Peter M. Swaziland, M.D.  DATE OF BIRTH:  01/15/1946   DATE OF ADMISSION:  05/13/2006  DATE OF DISCHARGE:  05/13/2006                                HISTORY & PHYSICAL   HISTORY OF PRESENT ILLNESS:  [name] is a pleasant 65 year old white  female who was transferred from [facility] fraction this morning for  evaluation of chest pain.  The patient states that she woke Wednesday  morning with severe chest pain.  This was initially described as a stabbing  and knife-like sensation.  She states she had this pain off and on  throughout, Wednesday and Thursday.  Last night, she had more severe chest  pain this morning.  She described recurrent chest pain that felt like a  crushing pressure sensation in her mid- chest radiating to her left shoulder  and into her left neck.  She did become diaphoretic last night.  She has had  no nausea or shortness of breath.  The patient has complex medical history.  She has been on chronic Coumadin therapy for antiphospholipid antibody  syndrome.  She has a history of breast cancer.  She has had prior surgery  for Barrett's esophagus.  She had been evaluated for chest pain in the past.  She underwent cardiac catheterization in 1998 which was normal.  She had a  normal stress Cardiolite study in September 2005.  She was last admitted on  December 2006 with chest pain.  At that time, a CT of the chest with  contrast was normal.  She also had a MRI of her spine which showed  significant disk disease at T5-T7.  She was treated conservatively and  discharged to home.  She states recurrent pain is similar to what she has  had in the past but also slightly different.   PAST MEDICAL HISTORY:  History of antiphospholipid antibody syndrome  resulting in DVT.   Patient has been on chronic Coumadin therapy for the last  15 years.  She has had a history of breast cancer status post mastectomy.  She has a history of hypothyroidism and a history of Barrett's esophagus for  which she apparently had extensive surgery for in the past.  The patient  does have a history of hypercholesterolemia and also has a history of  fibromyalgia.   CURRENT MEDICATIONS:  Include:  1. Coumadin 10 mg Monday and Thursday, 8 mg other days.  2. Synthroid 0.125 mg daily.  3. Wellbutrin 200 mg b.i.d.  4. AcipHex which she takes p.r.n.   ALLERGIES:  She is allergic to vitamin B, Daypro, Sulfa and Levaquin.   SOCIAL HISTORY:  The patient is single.  She has two children.  She denies  tobacco or alcohol use.   FAMILY HISTORY:  Mother has had two coronary stent procedures in the past.  Her father has passed away of hypothermia.   REVIEW OF SYSTEMS:  She also reports arthroscopic  surgery on her knee  earlier this year.  Her pro-times are followed by Dr. Cyndie Chime and have  been therapeutic.  She has no history of TIA or stroke or other arterial  disease.  Other review of systems are negative.   PHYSICAL EXAM:  GENERAL:  The patient is a pleasant white female in no  apparent distress.  She is afebrile.  VITAL SIGNS:  Her blood pressure is 138/64.  Pulse is 70 and regular.  SATs  were normal on room air.  HEENT:  She is normocephalic, atraumatic.  Pupils equal, round, reactive to  light and accommodation.  Extraocular movements were full.  Oropharynx is  clear.  NECK:  Supple without JVD, adenopathy, thyromegaly or bruits.  LUNGS:  Clear to auscultation percussion.  CARDIAC:  Exam reveals regular rate and rhythm without gallop, murmur, rub  or click.  There is one very focal spot of tenderness to palpation of the  left lower parasternal region.  ABDOMEN:  Soft, nontender without hepatosplenomegaly masses or bruits.  Femoral and pedal pulses are 2+ and symmetric.  She  has no edema.  NEUROLOGIC:  Exam is grossly intact.   LABORATORY DATA:  ECG is normal.  White count was 5,600, hemoglobin 12.4,  hematocrit 36.3, platelets 214,000.  PT is 27.7 with an INR of 2.4.  Sodium  is 138, potassium 4.4, chloride 103, CO2 26, glucose of 84, BUN 16,  creatinine 0.9.  Liver function studies were all normal. Point of care  cardiac enzymes are normal.  ECG was normal on admission.   IMPRESSION:  1. Chest pain with typical and atypical features.  However, the patient      has had extensive cardiac evaluation in the past which has been      unremarkable.  She had a normal cardiac catheterization in 1998, normal      Cardiolite study in September 2005.  2. History of antiphospholipid antibody syndrome on chronic Coumadin      therapy, therapeutic.  3. History of breast cancer status post mastectomy.  4. History of fibromyalgia.  5. Gastroesophageal reflux disease with history of Barrett's esophagus      status post surgery.  6. Hypercholesterolemia.  7. Fibromyalgia.   PLAN:  Would recommend conservative therapy at this point.  Would increase  her AcipHex to twice a day.  Continue her other medications.  She will be  discharged from emergency department with recommended follow-up with Dr.  Elease Hashimoto as an outpatient in approximately 1 week.   DISCHARGE STATUS:  Improved.           ______________________________  Peter M. Swaziland, M.D.     PMJ/MEDQ  D:  05/13/2006  T:  05/15/2006  Job:  [id number]   cc:   Ursula Beath, MD  Vesta Mixer, M.D.  Genene Churn. Cyndie Chime, M.D.  [facility]

## 2010-12-14 ENCOUNTER — Encounter: Payer: Self-pay | Admitting: Cardiovascular Disease

## 2010-12-14 DIAGNOSIS — Z862 Personal history of diseases of the blood and blood-forming organs and certain disorders involving the immune mechanism: Secondary | ICD-10-CM | POA: Insufficient documentation

## 2010-12-14 DIAGNOSIS — Z86718 Personal history of other venous thrombosis and embolism: Secondary | ICD-10-CM | POA: Insufficient documentation

## 2010-12-14 DIAGNOSIS — Z8673 Personal history of transient ischemic attack (TIA), and cerebral infarction without residual deficits: Secondary | ICD-10-CM | POA: Insufficient documentation

## 2010-12-14 DIAGNOSIS — C50919 Malignant neoplasm of unspecified site of unspecified female breast: Secondary | ICD-10-CM | POA: Insufficient documentation

## 2010-12-14 DIAGNOSIS — E039 Hypothyroidism, unspecified: Secondary | ICD-10-CM | POA: Insufficient documentation

## 2010-12-14 DIAGNOSIS — R079 Chest pain, unspecified: Secondary | ICD-10-CM | POA: Insufficient documentation

## 2010-12-16 ENCOUNTER — Ambulatory Visit: Payer: Medicare Other | Admitting: Cardiovascular Disease

## 2010-12-29 ENCOUNTER — Encounter (HOSPITAL_BASED_OUTPATIENT_CLINIC_OR_DEPARTMENT_OTHER): Payer: Medicare Other | Admitting: Oncology

## 2010-12-29 ENCOUNTER — Other Ambulatory Visit: Payer: Self-pay | Admitting: Oncology

## 2010-12-29 DIAGNOSIS — Z86718 Personal history of other venous thrombosis and embolism: Secondary | ICD-10-CM

## 2010-12-29 DIAGNOSIS — Z853 Personal history of malignant neoplasm of breast: Secondary | ICD-10-CM

## 2010-12-29 DIAGNOSIS — D6859 Other primary thrombophilia: Secondary | ICD-10-CM

## 2010-12-29 DIAGNOSIS — Z7901 Long term (current) use of anticoagulants: Secondary | ICD-10-CM

## 2010-12-29 LAB — PROTIME-INR
INR: 1.9 — ABNORMAL LOW (ref 2.00–3.50)
Protime: 22.8 Seconds — ABNORMAL HIGH (ref 10.6–13.4)

## 2011-01-19 ENCOUNTER — Encounter (HOSPITAL_BASED_OUTPATIENT_CLINIC_OR_DEPARTMENT_OTHER): Payer: Medicare Other | Admitting: Oncology

## 2011-01-19 ENCOUNTER — Other Ambulatory Visit: Payer: Self-pay | Admitting: Oncology

## 2011-01-19 DIAGNOSIS — D6859 Other primary thrombophilia: Secondary | ICD-10-CM

## 2011-01-19 DIAGNOSIS — Z853 Personal history of malignant neoplasm of breast: Secondary | ICD-10-CM

## 2011-01-19 DIAGNOSIS — Z7901 Long term (current) use of anticoagulants: Secondary | ICD-10-CM

## 2011-01-19 DIAGNOSIS — Z86718 Personal history of other venous thrombosis and embolism: Secondary | ICD-10-CM

## 2011-01-19 LAB — PROTIME-INR
INR: 2.6 (ref 2.00–3.50)
Protime: 31.2 Seconds — ABNORMAL HIGH (ref 10.6–13.4)

## 2011-02-16 ENCOUNTER — Encounter (HOSPITAL_BASED_OUTPATIENT_CLINIC_OR_DEPARTMENT_OTHER): Payer: Medicare Other | Admitting: Oncology

## 2011-02-16 ENCOUNTER — Other Ambulatory Visit: Payer: Self-pay | Admitting: Oncology

## 2011-02-16 DIAGNOSIS — Z7901 Long term (current) use of anticoagulants: Secondary | ICD-10-CM

## 2011-02-16 DIAGNOSIS — Z86718 Personal history of other venous thrombosis and embolism: Secondary | ICD-10-CM

## 2011-02-16 DIAGNOSIS — Z853 Personal history of malignant neoplasm of breast: Secondary | ICD-10-CM

## 2011-02-16 DIAGNOSIS — D6859 Other primary thrombophilia: Secondary | ICD-10-CM

## 2011-02-16 LAB — PROTIME-INR
INR: 2 (ref 2.00–3.50)
Protime: 24 Seconds — ABNORMAL HIGH (ref 10.6–13.4)

## 2011-03-16 ENCOUNTER — Encounter (HOSPITAL_BASED_OUTPATIENT_CLINIC_OR_DEPARTMENT_OTHER): Payer: Medicare Other | Admitting: Oncology

## 2011-03-16 ENCOUNTER — Other Ambulatory Visit: Payer: Self-pay | Admitting: Oncology

## 2011-03-16 DIAGNOSIS — Z86718 Personal history of other venous thrombosis and embolism: Secondary | ICD-10-CM

## 2011-03-16 DIAGNOSIS — Z853 Personal history of malignant neoplasm of breast: Secondary | ICD-10-CM

## 2011-03-16 DIAGNOSIS — Z7901 Long term (current) use of anticoagulants: Secondary | ICD-10-CM

## 2011-03-16 DIAGNOSIS — D6859 Other primary thrombophilia: Secondary | ICD-10-CM

## 2011-03-16 LAB — PROTIME-INR
INR: 2.8 (ref 2.00–3.50)
Protime: 33.6 Seconds — ABNORMAL HIGH (ref 10.6–13.4)

## 2011-03-22 ENCOUNTER — Encounter (HOSPITAL_BASED_OUTPATIENT_CLINIC_OR_DEPARTMENT_OTHER): Payer: Medicare Other | Admitting: Oncology

## 2011-03-22 ENCOUNTER — Other Ambulatory Visit: Payer: Self-pay | Admitting: Oncology

## 2011-03-22 DIAGNOSIS — Z7901 Long term (current) use of anticoagulants: Secondary | ICD-10-CM

## 2011-03-22 DIAGNOSIS — Z86718 Personal history of other venous thrombosis and embolism: Secondary | ICD-10-CM

## 2011-03-22 DIAGNOSIS — Z853 Personal history of malignant neoplasm of breast: Secondary | ICD-10-CM

## 2011-03-22 DIAGNOSIS — D6859 Other primary thrombophilia: Secondary | ICD-10-CM

## 2011-03-22 LAB — PROTIME-INR
INR: 1.1 — ABNORMAL LOW (ref 2.00–3.50)
Protime: 13.2 Seconds (ref 10.6–13.4)

## 2011-03-30 ENCOUNTER — Other Ambulatory Visit: Payer: Self-pay | Admitting: Oncology

## 2011-03-30 ENCOUNTER — Encounter (HOSPITAL_BASED_OUTPATIENT_CLINIC_OR_DEPARTMENT_OTHER): Payer: Medicare Other | Admitting: Oncology

## 2011-03-30 DIAGNOSIS — Z7901 Long term (current) use of anticoagulants: Secondary | ICD-10-CM

## 2011-03-30 DIAGNOSIS — D6859 Other primary thrombophilia: Secondary | ICD-10-CM

## 2011-03-30 DIAGNOSIS — Z853 Personal history of malignant neoplasm of breast: Secondary | ICD-10-CM

## 2011-03-30 DIAGNOSIS — Z86718 Personal history of other venous thrombosis and embolism: Secondary | ICD-10-CM

## 2011-03-30 LAB — PROTIME-INR
INR: 1.7 — ABNORMAL LOW (ref 2.00–3.50)
Protime: 20.4 Seconds — ABNORMAL HIGH (ref 10.6–13.4)

## 2011-04-05 ENCOUNTER — Other Ambulatory Visit: Payer: Self-pay | Admitting: Oncology

## 2011-04-05 ENCOUNTER — Encounter (HOSPITAL_BASED_OUTPATIENT_CLINIC_OR_DEPARTMENT_OTHER): Payer: Medicare Other | Admitting: Oncology

## 2011-04-05 DIAGNOSIS — Z86718 Personal history of other venous thrombosis and embolism: Secondary | ICD-10-CM

## 2011-04-05 DIAGNOSIS — Z7901 Long term (current) use of anticoagulants: Secondary | ICD-10-CM

## 2011-04-05 DIAGNOSIS — D6859 Other primary thrombophilia: Secondary | ICD-10-CM

## 2011-04-05 LAB — PROTIME-INR
INR: 1.6 — ABNORMAL LOW (ref 2.00–3.50)
Protime: 19.2 Seconds — ABNORMAL HIGH (ref 10.6–13.4)

## 2011-04-09 ENCOUNTER — Other Ambulatory Visit: Payer: Self-pay | Admitting: Oncology

## 2011-04-09 ENCOUNTER — Encounter (HOSPITAL_BASED_OUTPATIENT_CLINIC_OR_DEPARTMENT_OTHER): Payer: Medicare Other | Admitting: Oncology

## 2011-04-09 DIAGNOSIS — Z86718 Personal history of other venous thrombosis and embolism: Secondary | ICD-10-CM

## 2011-04-09 DIAGNOSIS — Z7901 Long term (current) use of anticoagulants: Secondary | ICD-10-CM

## 2011-04-09 DIAGNOSIS — D6859 Other primary thrombophilia: Secondary | ICD-10-CM

## 2011-04-09 LAB — CBC WITH DIFFERENTIAL/PLATELET
BASO%: 0.7 % (ref 0.0–2.0)
Basophils Absolute: 0 10*3/uL (ref 0.0–0.1)
EOS%: 3 % (ref 0.0–7.0)
Eosinophils Absolute: 0.2 10*3/uL (ref 0.0–0.5)
HCT: 38.5 % (ref 34.8–46.6)
HGB: 13.2 g/dL (ref 11.6–15.9)
LYMPH%: 29.7 % (ref 14.0–49.7)
MCH: 31.4 pg (ref 25.1–34.0)
MCHC: 34.3 g/dL (ref 31.5–36.0)
MCV: 91.4 fL (ref 79.5–101.0)
MONO#: 0.4 10*3/uL (ref 0.1–0.9)
MONO%: 5.9 % (ref 0.0–14.0)
NEUT#: 3.7 10*3/uL (ref 1.5–6.5)
NEUT%: 60.7 % (ref 38.4–76.8)
Platelets: 239 10*3/uL (ref 145–400)
RBC: 4.21 10*6/uL (ref 3.70–5.45)
RDW: 13.7 % (ref 11.2–14.5)
WBC: 6.1 10*3/uL (ref 3.9–10.3)
lymph#: 1.8 10*3/uL (ref 0.9–3.3)
nRBC: 0 % (ref 0–0)

## 2011-04-09 LAB — PROTIME-INR
INR: 2.1 (ref 2.00–3.50)
Protime: 25.2 Seconds — ABNORMAL HIGH (ref 10.6–13.4)

## 2011-04-14 LAB — PROTIME-INR
INR: 2.9 — ABNORMAL HIGH
Prothrombin Time: 31.2 — ABNORMAL HIGH

## 2011-04-14 LAB — SAMPLE TO BLOOD BANK

## 2011-04-16 ENCOUNTER — Encounter (HOSPITAL_BASED_OUTPATIENT_CLINIC_OR_DEPARTMENT_OTHER): Payer: Medicare Other | Admitting: Oncology

## 2011-04-16 ENCOUNTER — Other Ambulatory Visit: Payer: Self-pay | Admitting: Oncology

## 2011-04-16 DIAGNOSIS — Z86718 Personal history of other venous thrombosis and embolism: Secondary | ICD-10-CM

## 2011-04-16 DIAGNOSIS — D6859 Other primary thrombophilia: Secondary | ICD-10-CM

## 2011-04-16 DIAGNOSIS — Z7901 Long term (current) use of anticoagulants: Secondary | ICD-10-CM

## 2011-04-16 LAB — PROTIME-INR
INR: 2.1 (ref 2.00–3.50)
Protime: 25.2 Seconds — ABNORMAL HIGH (ref 10.6–13.4)

## 2011-04-22 LAB — CBC
HCT: 37.6
Hemoglobin: 12.9
MCHC: 34.3
MCV: 91.5
Platelets: 221
RBC: 4.11
RDW: 13.1
WBC: 6.8

## 2011-04-22 LAB — PROTIME-INR
INR: 2.4 — ABNORMAL HIGH
INR: 2.6 — ABNORMAL HIGH
Prothrombin Time: 27.1 — ABNORMAL HIGH
Prothrombin Time: 28.6 — ABNORMAL HIGH

## 2011-04-22 LAB — D-DIMER, QUANTITATIVE (NOT AT ARMC): D-Dimer, Quant: <0.22

## 2011-04-22 LAB — POCT I-STAT, CHEM 8
BUN: 13
Calcium, Ion: 1.18
Chloride: 103
Creatinine, Ser: 1.4 — ABNORMAL HIGH
Glucose, Bld: 94
HCT: 37
Hemoglobin: 12.6
Potassium: 4.1
Sodium: 139
TCO2: 28

## 2011-04-22 LAB — DIFFERENTIAL
Basophils Absolute: 0.1
Basophils Relative: 1
Eosinophils Absolute: 0.1
Eosinophils Relative: 2
Lymphocytes Relative: 29
Lymphs Abs: 2
Monocytes Absolute: 0.3
Monocytes Relative: 5
Neutro Abs: 4.3
Neutrophils Relative %: 64

## 2011-04-22 LAB — CARDIAC PANEL(CRET KIN+CKTOT+MB+TROPI)
CK, MB: 1.3
CK, MB: 1.5
Relative Index: INVALID
Relative Index: INVALID
Total CK: 68
Total CK: 68
Troponin I: <0.01
Troponin I: <0.01

## 2011-04-22 LAB — POCT CARDIAC MARKERS
CKMB, poc: 1.5
Myoglobin, poc: 73.7
Operator id: 146091
Troponin i, poc: <0.05

## 2011-04-22 LAB — TROPONIN I: Troponin I: <0.01

## 2011-04-22 LAB — CK TOTAL AND CKMB (NOT AT ARMC)
CK, MB: 1.7
Relative Index: INVALID
Total CK: 85

## 2011-04-28 ENCOUNTER — Other Ambulatory Visit: Payer: Self-pay | Admitting: Oncology

## 2011-04-28 ENCOUNTER — Encounter (HOSPITAL_BASED_OUTPATIENT_CLINIC_OR_DEPARTMENT_OTHER): Payer: Medicare Other | Admitting: Oncology

## 2011-04-28 DIAGNOSIS — Z86718 Personal history of other venous thrombosis and embolism: Secondary | ICD-10-CM

## 2011-04-28 DIAGNOSIS — D6859 Other primary thrombophilia: Secondary | ICD-10-CM

## 2011-04-28 DIAGNOSIS — Z7901 Long term (current) use of anticoagulants: Secondary | ICD-10-CM

## 2011-04-28 LAB — CBC WITH DIFFERENTIAL/PLATELET
BASO%: 0.6 % (ref 0.0–2.0)
Basophils Absolute: 0 10*3/uL (ref 0.0–0.1)
EOS%: 2.8 % (ref 0.0–7.0)
Eosinophils Absolute: 0.2 10*3/uL (ref 0.0–0.5)
HCT: 36.7 % (ref 34.8–46.6)
HGB: 12.4 g/dL (ref 11.6–15.9)
LYMPH%: 35.3 % (ref 14.0–49.7)
MCH: 31.5 pg (ref 25.1–34.0)
MCHC: 33.8 g/dL (ref 31.5–36.0)
MCV: 93.2 fL (ref 79.5–101.0)
MONO#: 0.4 10*3/uL (ref 0.1–0.9)
MONO%: 7 % (ref 0.0–14.0)
NEUT#: 3.1 10*3/uL (ref 1.5–6.5)
NEUT%: 54.3 % (ref 38.4–76.8)
Platelets: 201 10*3/uL (ref 145–400)
RBC: 3.94 10*6/uL (ref 3.70–5.45)
RDW: 14 % (ref 11.2–14.5)
WBC: 5.7 10*3/uL (ref 3.9–10.3)
lymph#: 2 10*3/uL (ref 0.9–3.3)

## 2011-04-28 LAB — COMPREHENSIVE METABOLIC PANEL
ALT: 16 U/L (ref 0–35)
AST: 17 U/L (ref 0–37)
Albumin: 4.5 g/dL (ref 3.5–5.2)
Alkaline Phosphatase: 88 U/L (ref 39–117)
BUN: 16 mg/dL (ref 6–23)
CO2: 25 mEq/L (ref 19–32)
Calcium: 9.1 mg/dL (ref 8.4–10.5)
Chloride: 101 mEq/L (ref 96–112)
Creatinine, Ser: 1.19 mg/dL — ABNORMAL HIGH (ref 0.50–1.10)
Glucose, Bld: 87 mg/dL (ref 70–99)
Potassium: 4.6 mEq/L (ref 3.5–5.3)
Sodium: 137 mEq/L (ref 135–145)
Total Bilirubin: 0.6 mg/dL (ref 0.3–1.2)
Total Protein: 6.8 g/dL (ref 6.0–8.3)

## 2011-04-28 LAB — PROTIME-INR
INR: 1.7 — ABNORMAL LOW (ref 2.00–3.50)
Protime: 20.4 Seconds — ABNORMAL HIGH (ref 10.6–13.4)

## 2011-04-28 LAB — LACTATE DEHYDROGENASE: LDH: 157 U/L (ref 94–250)

## 2011-05-04 ENCOUNTER — Other Ambulatory Visit: Payer: Self-pay | Admitting: Oncology

## 2011-05-04 ENCOUNTER — Encounter (HOSPITAL_BASED_OUTPATIENT_CLINIC_OR_DEPARTMENT_OTHER): Payer: Medicare Other | Admitting: Oncology

## 2011-05-04 ENCOUNTER — Other Ambulatory Visit: Payer: Self-pay | Admitting: Family Medicine

## 2011-05-04 DIAGNOSIS — D6859 Other primary thrombophilia: Secondary | ICD-10-CM

## 2011-05-04 DIAGNOSIS — Z86718 Personal history of other venous thrombosis and embolism: Secondary | ICD-10-CM

## 2011-05-04 DIAGNOSIS — R52 Pain, unspecified: Secondary | ICD-10-CM

## 2011-05-04 DIAGNOSIS — Z7901 Long term (current) use of anticoagulants: Secondary | ICD-10-CM

## 2011-05-04 LAB — PROTIME-INR
INR: 2.5 (ref 2.00–3.50)
Protime: 30 Seconds — ABNORMAL HIGH (ref 10.6–13.4)

## 2011-05-05 ENCOUNTER — Other Ambulatory Visit: Payer: Medicare Other

## 2011-05-06 LAB — POCT CARDIAC MARKERS
CKMB, poc: <1 — ABNORMAL LOW
CKMB, poc: <1 — ABNORMAL LOW
CKMB, poc: <1 — ABNORMAL LOW
Myoglobin, poc: 43
Myoglobin, poc: 49.3
Myoglobin, poc: 81.2
Operator id: 265201
Operator id: 272551
Operator id: 272551
Troponin i, poc: <0.05
Troponin i, poc: <0.05
Troponin i, poc: <0.05

## 2011-05-06 LAB — DIFFERENTIAL
Basophils Absolute: 0
Basophils Relative: 0
Eosinophils Absolute: 0.1
Eosinophils Relative: 2
Lymphocytes Relative: 32
Lymphs Abs: 2.2
Monocytes Absolute: 0.4
Monocytes Relative: 6
Neutro Abs: 4.1
Neutrophils Relative %: 59

## 2011-05-06 LAB — BASIC METABOLIC PANEL
BUN: 21
CO2: 28
Calcium: 9
Chloride: 103
Creatinine, Ser: 1.14
GFR calc Af Amer: 59 — ABNORMAL LOW
GFR calc non Af Amer: 49 — ABNORMAL LOW
Glucose, Bld: 80
Potassium: 4.2
Sodium: 139

## 2011-05-06 LAB — CBC
HCT: 39.1
Hemoglobin: 13.4
MCHC: 34.4
MCV: 91.4
Platelets: 228
RBC: 4.28
RDW: 14.9 — ABNORMAL HIGH
WBC: 6.9

## 2011-05-06 LAB — PROTIME-INR
INR: 1.6 — ABNORMAL HIGH
Prothrombin Time: 19.2 — ABNORMAL HIGH

## 2011-05-06 LAB — APTT: aPTT: 37

## 2011-05-18 ENCOUNTER — Encounter (HOSPITAL_BASED_OUTPATIENT_CLINIC_OR_DEPARTMENT_OTHER): Payer: Medicare Other | Admitting: Oncology

## 2011-05-18 ENCOUNTER — Other Ambulatory Visit: Payer: Self-pay | Admitting: Oncology

## 2011-05-18 DIAGNOSIS — D6859 Other primary thrombophilia: Secondary | ICD-10-CM

## 2011-05-18 DIAGNOSIS — Z7901 Long term (current) use of anticoagulants: Secondary | ICD-10-CM

## 2011-05-18 DIAGNOSIS — Z86718 Personal history of other venous thrombosis and embolism: Secondary | ICD-10-CM

## 2011-05-18 LAB — PROTIME-INR
INR: 1.9 — ABNORMAL LOW (ref 2.00–3.50)
Protime: 22.8 Seconds — ABNORMAL HIGH (ref 10.6–13.4)

## 2011-06-01 DIAGNOSIS — R760 Raised antibody titer: Secondary | ICD-10-CM | POA: Insufficient documentation

## 2011-06-01 DIAGNOSIS — D6861 Antiphospholipid syndrome: Secondary | ICD-10-CM | POA: Insufficient documentation

## 2011-06-15 ENCOUNTER — Ambulatory Visit: Payer: Medicare Other

## 2011-06-15 ENCOUNTER — Other Ambulatory Visit (HOSPITAL_BASED_OUTPATIENT_CLINIC_OR_DEPARTMENT_OTHER): Payer: Medicare Other | Admitting: Lab

## 2011-06-15 ENCOUNTER — Other Ambulatory Visit: Payer: Self-pay | Admitting: Oncology

## 2011-06-15 DIAGNOSIS — Z7901 Long term (current) use of anticoagulants: Secondary | ICD-10-CM

## 2011-06-15 DIAGNOSIS — R760 Raised antibody titer: Secondary | ICD-10-CM

## 2011-06-15 DIAGNOSIS — D6861 Antiphospholipid syndrome: Secondary | ICD-10-CM

## 2011-06-15 DIAGNOSIS — Z86718 Personal history of other venous thrombosis and embolism: Secondary | ICD-10-CM

## 2011-06-15 DIAGNOSIS — D6859 Other primary thrombophilia: Secondary | ICD-10-CM

## 2011-06-15 LAB — POCT INR
INR: 2.7
INR: 2.7
INR: 2.7

## 2011-06-15 LAB — PROTIME-INR
INR: 2.7 (ref 2.00–3.50)
Protime: 32.4 Seconds — ABNORMAL HIGH (ref 10.6–13.4)

## 2011-06-18 ENCOUNTER — Telehealth: Payer: Self-pay | Admitting: Oncology

## 2011-06-18 NOTE — Telephone Encounter (Signed)
Patient called to inform the Coumadin Clinic that she has been started on new medications to help treat her sinus infection and increase her HDL cholesterol.  She was started on a 10-day course of amoxicilling 500mg  BID and Flonase for the sinus infection, and Niacin for her cholesterol.  Med list updated.  She will keep her next scheduled AntiCoag appt, and call us if anything changes clinically that would indicate that we should see her sooner.

## 2011-06-21 ENCOUNTER — Ambulatory Visit (HOSPITAL_BASED_OUTPATIENT_CLINIC_OR_DEPARTMENT_OTHER): Payer: Self-pay | Admitting: Oncology

## 2011-06-21 ENCOUNTER — Telehealth (HOSPITAL_COMMUNITY): Payer: Self-pay | Admitting: Pharmacist

## 2011-06-21 DIAGNOSIS — D6861 Antiphospholipid syndrome: Secondary | ICD-10-CM

## 2011-06-21 DIAGNOSIS — R894 Abnormal immunological findings in specimens from other organs, systems and tissues: Secondary | ICD-10-CM

## 2011-06-21 DIAGNOSIS — R760 Raised antibody titer: Secondary | ICD-10-CM

## 2011-06-21 DIAGNOSIS — D6859 Other primary thrombophilia: Secondary | ICD-10-CM

## 2011-06-21 NOTE — Progress Notes (Signed)
Pt started on Amoxicillin 500 mg BID on 06/16/11 for 10 day course. Also on Fluticasone Nasal Spray BID - started on 06/17/11. She has RX for Niaspan for hypertriglyceridemia.  She cannot afford the $90 cost.  She may go on OTC Niacin.  She will let us know what she ultimately decides. All of these drugs have potential to increase INR. She would like to have her INR checked this week so we can see where she stands. She will continue current Warfarin dose for now.

## 2011-06-23 ENCOUNTER — Other Ambulatory Visit: Payer: Self-pay | Admitting: Oncology

## 2011-06-23 ENCOUNTER — Other Ambulatory Visit (HOSPITAL_BASED_OUTPATIENT_CLINIC_OR_DEPARTMENT_OTHER): Payer: Medicare Other

## 2011-06-23 ENCOUNTER — Ambulatory Visit: Payer: Medicare Other

## 2011-06-23 ENCOUNTER — Ambulatory Visit: Payer: Self-pay | Admitting: Pharmacist

## 2011-06-23 DIAGNOSIS — R760 Raised antibody titer: Secondary | ICD-10-CM

## 2011-06-23 DIAGNOSIS — D6861 Antiphospholipid syndrome: Secondary | ICD-10-CM

## 2011-06-23 DIAGNOSIS — D6859 Other primary thrombophilia: Secondary | ICD-10-CM

## 2011-06-23 LAB — POCT INR: INR: 2.3

## 2011-06-23 LAB — PROTIME-INR
INR: 2.3 (ref 2.00–3.50)
Protime: 27.6 Seconds — ABNORMAL HIGH (ref 10.6–13.4)

## 2011-06-23 NOTE — Patient Instructions (Signed)
To start OTC Niacin 12/2.

## 2011-06-23 NOTE — Progress Notes (Signed)
Pt is having a spot on her skin (chin area) evaluated on Friday, 11/30. It may need to be biopsied. It is the size of the tip of a marker. Pt doesn't need to hold Coumadin for skin biopsy.

## 2011-07-02 ENCOUNTER — Ambulatory Visit (HOSPITAL_BASED_OUTPATIENT_CLINIC_OR_DEPARTMENT_OTHER): Payer: Self-pay | Admitting: Pharmacist

## 2011-07-02 ENCOUNTER — Ambulatory Visit: Payer: Medicare Other

## 2011-07-02 ENCOUNTER — Other Ambulatory Visit: Payer: Self-pay | Admitting: Pharmacist

## 2011-07-02 ENCOUNTER — Other Ambulatory Visit (HOSPITAL_BASED_OUTPATIENT_CLINIC_OR_DEPARTMENT_OTHER): Payer: Medicare Other | Admitting: Lab

## 2011-07-02 DIAGNOSIS — R760 Raised antibody titer: Secondary | ICD-10-CM

## 2011-07-02 DIAGNOSIS — Z862 Personal history of diseases of the blood and blood-forming organs and certain disorders involving the immune mechanism: Secondary | ICD-10-CM

## 2011-07-02 DIAGNOSIS — D6861 Antiphospholipid syndrome: Secondary | ICD-10-CM

## 2011-07-02 DIAGNOSIS — Z86718 Personal history of other venous thrombosis and embolism: Secondary | ICD-10-CM

## 2011-07-02 DIAGNOSIS — D6859 Other primary thrombophilia: Secondary | ICD-10-CM

## 2011-07-02 DIAGNOSIS — R894 Abnormal immunological findings in specimens from other organs, systems and tissues: Secondary | ICD-10-CM

## 2011-07-02 LAB — PROTIME-INR
INR: 2.6 (ref 2.00–3.50)
Protime: 31.2 Seconds — ABNORMAL HIGH (ref 10.6–13.4)

## 2011-07-02 LAB — POCT INR: INR: 2.6

## 2011-07-02 NOTE — Progress Notes (Signed)
No complications noted & INR stable since pt started Niacin 5 days ago. Repeat protime in 12 days. Marily Lente, Pharm.D.

## 2011-07-14 ENCOUNTER — Ambulatory Visit: Payer: Medicare Other | Admitting: Cardiovascular Disease

## 2011-07-15 ENCOUNTER — Other Ambulatory Visit (HOSPITAL_BASED_OUTPATIENT_CLINIC_OR_DEPARTMENT_OTHER): Payer: Medicare Other | Admitting: Lab

## 2011-07-15 ENCOUNTER — Ambulatory Visit: Payer: Self-pay | Admitting: Oncology

## 2011-07-15 ENCOUNTER — Ambulatory Visit: Payer: Medicare Other

## 2011-07-15 DIAGNOSIS — Z862 Personal history of diseases of the blood and blood-forming organs and certain disorders involving the immune mechanism: Secondary | ICD-10-CM

## 2011-07-15 DIAGNOSIS — D6861 Antiphospholipid syndrome: Secondary | ICD-10-CM

## 2011-07-15 DIAGNOSIS — R760 Raised antibody titer: Secondary | ICD-10-CM

## 2011-07-15 DIAGNOSIS — Z86718 Personal history of other venous thrombosis and embolism: Secondary | ICD-10-CM

## 2011-07-15 LAB — PROTIME-INR
INR: 2.7 (ref 2.00–3.50)
Protime: 32.4 Seconds — ABNORMAL HIGH (ref 10.6–13.4)

## 2011-07-15 LAB — POCT INR: INR: 2.7

## 2011-07-22 ENCOUNTER — Ambulatory Visit (INDEPENDENT_AMBULATORY_CARE_PROVIDER_SITE_OTHER): Payer: Medicare Other | Admitting: Cardiovascular Disease

## 2011-07-22 ENCOUNTER — Encounter: Payer: Self-pay | Admitting: Cardiovascular Disease

## 2011-07-22 DIAGNOSIS — D6859 Other primary thrombophilia: Secondary | ICD-10-CM

## 2011-07-22 DIAGNOSIS — R079 Chest pain, unspecified: Secondary | ICD-10-CM

## 2011-07-22 DIAGNOSIS — D6861 Antiphospholipid syndrome: Secondary | ICD-10-CM

## 2011-07-22 NOTE — Patient Instructions (Signed)
Your physician wants you to follow-up in: 1 year with EKG You will receive a reminder letter in the mail two months in advance. If you don't receive a letter, please call our office to schedule the follow-up appointment. 

## 2011-07-22 NOTE — Progress Notes (Signed)
[name] Date of Birth  [birth year] [facility] [address]    [address] [place], [place]  [address] [phone]  Fax  [phone]  [facility] [address] [place], [place]  [address] [phone]   Fax [phone]  History of Present Illness:  [name] is a 65 year old female with a history of antiphospholipid antibody and a stroke in the past. She has a history of chest pains but has had a normal heart cath  in 1998. She presents today with left-sided jaw and teeth pain. These episodes occur spontaneously. They're not associated with exercise. They typically are relieved if she puts Orajel on her teeth and gums.   Current Outpatient Prescriptions on File Prior to Visit  Medication Sig Dispense Refill  . ALPRAZolam (XANAX) 1 MG tablet Take 1 mg by mouth at bedtime as needed.        [name] aspirin 81 MG tablet Take 81 mg by mouth daily.        [name] buPROPion (WELLBUTRIN SR) 200 MG 12 hr tablet Take 200 mg by mouth 2 (two) times daily.        . fish oil-omega-3 fatty acids 1000 MG capsule Take by mouth daily. 2 DAILY       . fluticasone (FLONASE) 50 MCG/ACT nasal spray Place 2 sprays into the nose daily.        . fluticasone (FLOVENT DISKUS) 50 MCG/BLIST diskus inhaler Inhale 1 puff into the lungs as needed.        . gabapentin (NEURONTIN) 300 MG capsule Take 300 mg by mouth 2 (two) times daily.        [name] HYDROcodone-acetaminophen (VICODIN) 5-500 MG per tablet Take 1 tablet by mouth every 6 (six) hours as needed.        [name] levothyroxine (SYNTHROID, LEVOTHROID) 100 MCG tablet Take 88 mcg by mouth daily.       . niacin 500 MG tablet Take 500 mg by mouth at bedtime.        . nitroGLYCERIN (NITROSTAT) 0.4 MG SL tablet Place 0.4 mg under the tongue every 5 (five) minutes as needed.        [name] omeprazole (PRILOSEC) 20 MG capsule Take 20 mg by mouth 2 (two) times daily.        . pravastatin (PRAVACHOL) 40 MG tablet Take 40 mg by mouth daily.        [name] warfarin (COUMADIN)  5 MG tablet Take 4 mg by mouth as directed.       . zolpidem (AMBIEN) 10 MG tablet Take 5 mg by mouth at bedtime.          Allergies  Allergen Reactions  . Benadryl (Altaryl)     Rash  . Crestor (Rosuvastatin Calcium)     Muscle Aches  . Daypro (Oxaprozin)     Rash  . Hornet Venom     Anaphylaxis Shock  . Levaquin     Rash  . Levofloxacin   . Sulfonamide Derivatives   . Vitamin K   . Zocor (Simvastatin)     Muscle Aches    Past Medical History  Diagnosis Date  . Chest pain   . Hx-TIA (transient ischemic attack)   . Hypothyroidism   . Breast cancer   . History of DVT (deep vein thrombosis)   . History of antiphospholipid antibody syndrome     Past Surgical History  Procedure Date  . Cardiac catheterization 07/12/1997    NORMAL LEFT VENTRICULAR FUNCTION  WITH EF AT LEAST 70-75%  . Thyroidectomy   . Left oophorectomy 1982  . Right oophorectomy 1980    History  Smoking status  . Never Smoker   Smokeless tobacco  . Not on file    History  Alcohol Use No    No family history on file.  Reviw of Systems:  Reviewed in the HPI.  All other systems are negative.  Physical Exam: BP 124/82  Pulse 68  Ht 5' 5.5" (1.664 m)  Wt 176 lb (79.833 kg)  BMI 28.84 kg/m2 The patient is alert and oriented x 3.  The mood and affect are normal.   Skin: warm and dry.  Color is normal.    HEENT:   Normocephalic/atraumatic. Is no JVD. Carotids are normal.  Her neck is supple. There is no jaw tenderness.  Lungs: Lungs are clear.   Heart: Regular rate S1-S2. Chest no murmurs.    Abdomen: Is good bowel sounds. There is no hepatosplenomegaly.  Extremities:  Her left lower leg is mildly tender. There is no significant edema.  Neuro:  Her exam is nonfocal. Her gait is normal.    ECG: 1 sinus rhythm/normal EKG.  Assessment / Plan:

## 2011-07-22 NOTE — Assessment & Plan Note (Signed)
She has a history of antiphospholipid antibody and is on chronic Coumadin therapy which is followed by Dr.Grandfortuna. She's had a history of a stroke in the past.

## 2011-07-22 NOTE — Assessment & Plan Note (Signed)
[name] has a history of chest pains in the past. She's had a normal heart catheterization in 1998 and a normal stress  Myoview  in March of 2012. I don't think that her current episodes of neck pain are due to a cardiac etiology. Her EKG is completely normal today.

## 2011-07-23 ENCOUNTER — Encounter: Payer: Self-pay | Admitting: *Deleted

## 2011-07-23 NOTE — Progress Notes (Signed)
Clinical Social Work was referred by the coumadin clinic to discuss dental resources.  CSW met with pt in office today to assess for needs.  Ms. Seeber states she is in a lot of pain which may be caused by dental needs. The patient has not been to the dentist in over five years due to lack of insurance coverage. Due to very limited dental resources, CSW recommendation was to contact her insurance provider to determine if any services are covered. CSW also provided pt with information on a dental clinic in [place] that will accept new patients on a sliding-fee scale. Pt plans to go to the [facility] clinic in [place] next week to obtain an appointment. Ms. Grimsley will call CSW after that visit. The patient had no other needs at this time. Kathrin Penner, MSW, [facility] Clinical Social Worker [facility] [phone]

## 2011-07-29 ENCOUNTER — Other Ambulatory Visit: Payer: Medicare Other | Admitting: Lab

## 2011-07-29 ENCOUNTER — Ambulatory Visit: Payer: Medicare Other

## 2011-08-16 ENCOUNTER — Telehealth: Payer: Self-pay | Admitting: Pharmacist

## 2011-08-16 NOTE — Telephone Encounter (Signed)
Noted - likely not necessary to stop her coumadin
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
## 2011-08-17 ENCOUNTER — Ambulatory Visit: Payer: Medicare Other

## 2011-08-17 ENCOUNTER — Other Ambulatory Visit: Payer: Medicare Other | Admitting: Lab

## 2011-08-17 DIAGNOSIS — D6861 Antiphospholipid syndrome: Secondary | ICD-10-CM

## 2011-08-17 DIAGNOSIS — Z862 Personal history of diseases of the blood and blood-forming organs and certain disorders involving the immune mechanism: Secondary | ICD-10-CM

## 2011-08-17 DIAGNOSIS — Z86718 Personal history of other venous thrombosis and embolism: Secondary | ICD-10-CM

## 2011-08-17 DIAGNOSIS — R760 Raised antibody titer: Secondary | ICD-10-CM

## 2011-08-17 LAB — PROTIME-INR
INR: 3.2 (ref 2.00–3.50)
Protime: 38.4 Seconds — ABNORMAL HIGH (ref 10.6–13.4)

## 2011-08-17 NOTE — Progress Notes (Unsigned)
Ortho appt not scheduled yet.  Pt expects to hear soon.  Awaiting records from medical facility in [place]. Pt doing ok w/ Coumadin & no dose adjustment is necessary. Return in 1 month unless significant med change(s) or procedure is scheduled.  Pt aware to call us if this occurs. Marily Lente, Pharm.D.

## 2011-08-17 NOTE — Progress Notes (Signed)
I agree with plan

## 2011-08-19 ENCOUNTER — Telehealth: Payer: Self-pay | Admitting: Pharmacist

## 2011-08-19 NOTE — Telephone Encounter (Signed)
Patient received prescriptions for Percocet 5/325mg  (Take 1-2 tablets Q12H PRN) and Flexeril 10mg  tablets (Take 1 tablet QHSPRN). She stated she is only taking 1 Percocet tab Q 12hours. She wanted to know if there were any interactions with these medications and her coumadin. INR =3.2 on 1/22. We discussed these medications usually don't interact with her Coumadin. Also, if she starts taking the 2 Percocet tablets Q 12 hours like directed on the bottle, her    daily intake of Tylenol may alter her INR. She will call us to have her INR checked before the end of February if she does increase the Percocet dose.

## 2011-08-26 ENCOUNTER — Telehealth: Payer: Self-pay | Admitting: Pharmacist

## 2011-08-30 ENCOUNTER — Telehealth: Payer: Self-pay | Admitting: Pharmacist

## 2011-08-30 ENCOUNTER — Telehealth: Payer: Self-pay

## 2011-08-30 NOTE — Telephone Encounter (Signed)
REceived message from pt stating that our office just called her.  Called pt back re: note from [place], pharmacist.  Pt states she is taking 500 mg amoxicillin QID x 7 days, started 08/26/11, and she is aware of appt tomorrow.  Relayed this information to [place].

## 2011-08-31 ENCOUNTER — Other Ambulatory Visit (HOSPITAL_BASED_OUTPATIENT_CLINIC_OR_DEPARTMENT_OTHER): Payer: Medicare Other | Admitting: Lab

## 2011-08-31 ENCOUNTER — Ambulatory Visit: Payer: Medicare Other

## 2011-08-31 DIAGNOSIS — D6861 Antiphospholipid syndrome: Secondary | ICD-10-CM

## 2011-08-31 DIAGNOSIS — Z862 Personal history of diseases of the blood and blood-forming organs and certain disorders involving the immune mechanism: Secondary | ICD-10-CM

## 2011-08-31 DIAGNOSIS — Z5181 Encounter for therapeutic drug level monitoring: Secondary | ICD-10-CM

## 2011-08-31 DIAGNOSIS — R760 Raised antibody titer: Secondary | ICD-10-CM

## 2011-08-31 DIAGNOSIS — Z86718 Personal history of other venous thrombosis and embolism: Secondary | ICD-10-CM

## 2011-08-31 DIAGNOSIS — Z7901 Long term (current) use of anticoagulants: Secondary | ICD-10-CM

## 2011-08-31 LAB — POCT INR: INR: 3

## 2011-08-31 LAB — PROTIME-INR
INR: 3 (ref 2.00–3.50)
Protime: 36 Seconds — ABNORMAL HIGH (ref 10.6–13.4)

## 2011-08-31 NOTE — Patient Instructions (Signed)
Continue same dose = 6mg  daily except 8mg  on MWF. Recheck INR in 1 month.

## 2011-08-31 NOTE — Progress Notes (Unsigned)
Continue same dose = 6mg daily except 8mg on MWF. Recheck INR in 1 month. 

## 2011-09-03 ENCOUNTER — Telehealth: Payer: Self-pay | Admitting: Pharmacist

## 2011-09-03 NOTE — Telephone Encounter (Signed)
Pt started on a Prednisone taper today. She will be on a 12 day taper...60mg /day x 2 days, 50mg /day x 2 days, 40mg /day x 2 days, etc. This is to help with her tail bone pain she is experiencing from her fall in January. Will check her INR on 09/08/11 to evaluate effect of Prednisone on INR (this will be on her 6th day of the taper). She was previously scheduled for f/u INR in March.

## 2011-09-06 ENCOUNTER — Telehealth: Payer: Self-pay | Admitting: Pharmacist

## 2011-09-08 ENCOUNTER — Ambulatory Visit (HOSPITAL_BASED_OUTPATIENT_CLINIC_OR_DEPARTMENT_OTHER): Payer: Medicare Other | Admitting: Pharmacist

## 2011-09-08 ENCOUNTER — Other Ambulatory Visit: Payer: Self-pay | Admitting: Pharmacist

## 2011-09-08 ENCOUNTER — Other Ambulatory Visit: Payer: Medicare Other

## 2011-09-08 DIAGNOSIS — Z86718 Personal history of other venous thrombosis and embolism: Secondary | ICD-10-CM

## 2011-09-08 DIAGNOSIS — R894 Abnormal immunological findings in specimens from other organs, systems and tissues: Secondary | ICD-10-CM

## 2011-09-08 DIAGNOSIS — D6859 Other primary thrombophilia: Secondary | ICD-10-CM

## 2011-09-08 DIAGNOSIS — Z862 Personal history of diseases of the blood and blood-forming organs and certain disorders involving the immune mechanism: Secondary | ICD-10-CM

## 2011-09-08 DIAGNOSIS — R760 Raised antibody titer: Secondary | ICD-10-CM

## 2011-09-08 DIAGNOSIS — D6861 Antiphospholipid syndrome: Secondary | ICD-10-CM

## 2011-09-08 DIAGNOSIS — Z7901 Long term (current) use of anticoagulants: Secondary | ICD-10-CM

## 2011-09-08 LAB — POCT INR: INR: 2.9

## 2011-09-08 LAB — PROTIME-INR
INR: 2.9 (ref 2.00–3.50)
Protime: 34.8 Seconds — ABNORMAL HIGH (ref 10.6–13.4)

## 2011-09-08 NOTE — Progress Notes (Signed)
Despite pt being on Prednisone taper, INR looks fine. Augmentin not affecting her INR as expected. Will continue same dose of Coumadin & return in 1 month unless med changes made/procedures planned in the interim. Marily Lente, Pharm.D.

## 2011-09-14 ENCOUNTER — Other Ambulatory Visit: Payer: Medicare Other | Admitting: Lab

## 2011-09-14 ENCOUNTER — Ambulatory Visit: Payer: Medicare Other

## 2011-09-16 ENCOUNTER — Telehealth: Payer: Self-pay | Admitting: Pharmacist

## 2011-09-16 NOTE — Telephone Encounter (Signed)
Pt called to ask if a prescription her dentist prescribed would interact with her Coumadin.  She visited her dentist with CC of tooth pain, but he did not find anything obviously wrong with her teeth.  [name] states that he thought the pain was more related to her sinuses, and prescribed fluconazole 100mg  to empirically treat for a fungal infection of the sinus.  She has had multiple courses of antibiotics in the past to treat sinus infections.  After explaining that this did have a significant interaction, and may cause her INR to increase, [name] decided that she would not take this prescription now.  Instead, she will go see a ear/nose/throat specialist first for a second opinion.

## 2011-09-24 ENCOUNTER — Ambulatory Visit: Payer: Medicare Other | Admitting: Oncology

## 2011-09-28 ENCOUNTER — Other Ambulatory Visit: Payer: Medicare Other | Admitting: Lab

## 2011-09-28 ENCOUNTER — Ambulatory Visit (HOSPITAL_BASED_OUTPATIENT_CLINIC_OR_DEPARTMENT_OTHER): Payer: Medicare Other | Admitting: Pharmacist

## 2011-09-28 DIAGNOSIS — D6859 Other primary thrombophilia: Secondary | ICD-10-CM

## 2011-09-28 DIAGNOSIS — Z86718 Personal history of other venous thrombosis and embolism: Secondary | ICD-10-CM

## 2011-09-28 DIAGNOSIS — D6861 Antiphospholipid syndrome: Secondary | ICD-10-CM

## 2011-09-28 DIAGNOSIS — Z862 Personal history of diseases of the blood and blood-forming organs and certain disorders involving the immune mechanism: Secondary | ICD-10-CM

## 2011-09-28 DIAGNOSIS — R894 Abnormal immunological findings in specimens from other organs, systems and tissues: Secondary | ICD-10-CM

## 2011-09-28 DIAGNOSIS — R760 Raised antibody titer: Secondary | ICD-10-CM

## 2011-09-28 LAB — PROTIME-INR
INR: 3.7 — ABNORMAL HIGH (ref 2.00–3.50)
Protime: 44.4 Seconds — ABNORMAL HIGH (ref 10.6–13.4)

## 2011-09-28 LAB — POCT INR: INR: 3.7

## 2011-09-28 NOTE — Patient Instructions (Signed)
Pt has been done with her Augmentin for over a week.  No other changes to report.  Not sure why she is slightly supratherapeutic.  We will hold dose today and resume at your regular dose of 8 mg on Mon, Wed and Fri with 6 mg on other days.

## 2011-10-18 ENCOUNTER — Ambulatory Visit (HOSPITAL_BASED_OUTPATIENT_CLINIC_OR_DEPARTMENT_OTHER): Payer: Medicare Other | Admitting: Pharmacist

## 2011-10-18 ENCOUNTER — Other Ambulatory Visit: Payer: Medicare Other | Admitting: Lab

## 2011-10-18 DIAGNOSIS — D6859 Other primary thrombophilia: Secondary | ICD-10-CM

## 2011-10-18 DIAGNOSIS — Z7901 Long term (current) use of anticoagulants: Secondary | ICD-10-CM

## 2011-10-18 DIAGNOSIS — Z862 Personal history of diseases of the blood and blood-forming organs and certain disorders involving the immune mechanism: Secondary | ICD-10-CM

## 2011-10-18 DIAGNOSIS — Z86718 Personal history of other venous thrombosis and embolism: Secondary | ICD-10-CM

## 2011-10-18 DIAGNOSIS — D6861 Antiphospholipid syndrome: Secondary | ICD-10-CM

## 2011-10-18 DIAGNOSIS — R760 Raised antibody titer: Secondary | ICD-10-CM

## 2011-10-18 LAB — PROTIME-INR
INR: 3.1 (ref 2.00–3.50)
Protime: 37.2 Seconds — ABNORMAL HIGH (ref 10.6–13.4)

## 2011-10-18 LAB — POCT INR: INR: 3.1

## 2011-10-18 NOTE — Progress Notes (Signed)
Continue same dose = 6mg  daily except 8mg  on MWF. Recheck INR in 3 weeks on 11/09/11 @ 11am. May need to consider slight dose change if INR elevated at next visit. Patient is doing well today. No bleeding. No bruising. No problems to report. She would like to continue her current (ususal) Coumadin dose. I agree. Medication list updated.

## 2011-10-18 NOTE — Patient Instructions (Signed)
Continue same dose = 6mg  daily except 8mg  on MWF. Recheck INR in 3 weeks on 11/09/11 @ 11am.

## 2011-10-21 ENCOUNTER — Telehealth: Payer: Self-pay | Admitting: Pharmacist

## 2011-10-21 NOTE — Telephone Encounter (Signed)
Pt called to inform the Coumadin Clinic that after she visited the doctor, he confirmed that she has a bit of thrush. She has started on diphenhydramine-lidocaine-nystatin suspension, swish and swallow 5mL QID until bottle has finished ( bottle), no refills.  This should not pose any significant drug interactions with her Coumadin.  Her next Coumadin Clinic appt is on 11/09/11.

## 2011-10-27 ENCOUNTER — Telehealth: Payer: Self-pay | Admitting: Pharmacist

## 2011-10-27 NOTE — Telephone Encounter (Signed)
Pt called to ask some more questions about medications she can take with her Coumadin.  The oral rinse has not worked for her oral thrush, and the fungal infection in her sinuses is not improving.  Her dentist and PCP had recommended use of a decongestant, but really she needs to fill a Rx for fluconazole 100mg  PO BID x 30 days (with 1 RF).  I recommended a couple of decongestant OTC products, and then assured the patient that it is not contraindicated to use fluconazole with her Coumadin.  Clearly she needs the antifungal as her PCP has noted structural changes in her sinuses related to inflammation and infection.  I explained that we can work with her to check her INR more frequently and adjust her dose or hold doses to keep her in therapeutic range.  I instructed the pt to fill the fluconazole today, and start taking the medication today or tomorrow morning.  We will check her INR in 5-7 days after starting the fluconazole.  She is currently booked for lab/Coumadin clinic on 11/09/11, and I will not cancel this appt just yet.   It is highly likely that we will need to see her again on that day, so I will have scheduling book her an additional appt on 11/02/11 for lab/CC.

## 2011-11-02 ENCOUNTER — Other Ambulatory Visit (HOSPITAL_BASED_OUTPATIENT_CLINIC_OR_DEPARTMENT_OTHER): Payer: Medicare Other | Admitting: Lab

## 2011-11-02 ENCOUNTER — Ambulatory Visit (HOSPITAL_BASED_OUTPATIENT_CLINIC_OR_DEPARTMENT_OTHER): Payer: Medicare Other | Admitting: Pharmacist

## 2011-11-02 DIAGNOSIS — R894 Abnormal immunological findings in specimens from other organs, systems and tissues: Secondary | ICD-10-CM

## 2011-11-02 DIAGNOSIS — D6859 Other primary thrombophilia: Secondary | ICD-10-CM

## 2011-11-02 DIAGNOSIS — Z862 Personal history of diseases of the blood and blood-forming organs and certain disorders involving the immune mechanism: Secondary | ICD-10-CM

## 2011-11-02 DIAGNOSIS — Z86718 Personal history of other venous thrombosis and embolism: Secondary | ICD-10-CM

## 2011-11-02 LAB — POCT INR: INR: 6.07

## 2011-11-02 LAB — PROTHROMBIN TIME
INR: 6.07 (ref <1.50)
Prothrombin Time: 54.8 seconds — ABNORMAL HIGH (ref 11.6–15.2)

## 2011-11-02 LAB — PROTIME-INR

## 2011-11-02 NOTE — Progress Notes (Signed)
INR supratherapeutic at 6.07 today (sent out for verification). Pt is asymptomatic.  She has had nausea for 2 days.  No vomiting. Hold x 2 doses then decrease to 6 mg/day. Return next Tues for protime recheck. Marily Lente, Pharm.D.

## 2011-11-03 ENCOUNTER — Ambulatory Visit (HOSPITAL_BASED_OUTPATIENT_CLINIC_OR_DEPARTMENT_OTHER): Payer: Medicare Other | Admitting: Pharmacist

## 2011-11-03 ENCOUNTER — Ambulatory Visit (HOSPITAL_BASED_OUTPATIENT_CLINIC_OR_DEPARTMENT_OTHER): Payer: Medicare Other

## 2011-11-03 DIAGNOSIS — Z7901 Long term (current) use of anticoagulants: Secondary | ICD-10-CM

## 2011-11-03 DIAGNOSIS — D6861 Antiphospholipid syndrome: Secondary | ICD-10-CM

## 2011-11-03 DIAGNOSIS — Z862 Personal history of diseases of the blood and blood-forming organs and certain disorders involving the immune mechanism: Secondary | ICD-10-CM

## 2011-11-03 DIAGNOSIS — Z86718 Personal history of other venous thrombosis and embolism: Secondary | ICD-10-CM

## 2011-11-03 DIAGNOSIS — D6859 Other primary thrombophilia: Secondary | ICD-10-CM

## 2011-11-03 DIAGNOSIS — R760 Raised antibody titer: Secondary | ICD-10-CM

## 2011-11-03 LAB — PROTIME-INR

## 2011-11-03 LAB — POCT INR: INR: 5.98

## 2011-11-03 LAB — PROTHROMBIN TIME
INR: 5.98 (ref <1.50)
Prothrombin Time: 54.2 seconds — ABNORMAL HIGH (ref 11.6–15.2)

## 2011-11-03 NOTE — Progress Notes (Addendum)
INR = 5.98 today. Pt called pharmacy today c/o bleeding from nose & coughing up blood last night. I have d/w Dr. Cyndie Chime & he had pt take Vit K 2.5 mg PO in office today.  We observed her since she has h/o reaction w/ IV vit K.  She did fine w/ PO Vit K. She will cont to hold her Coumadin until Saturday then will start back at reduced dose of 4 mg/day. She plans to call Dr. Melvyn Neth' office (prescribed Diflucan for oral/sinus infection) & see if she really needs the Diflucan for 30 days.  Pt knows to call us if the Diflucan stops before she returns on Tues 4/16. Marily Lente, Pharm.D.  After pt left clinic, she called me back & stated she spoke w/ Dr. Melvyn Neth' office.  He instructed her to stop the Diflucan & go on X-Clear Rinse. As a result, I have instructed pt that when she starts back on Coumadin this Saturday, restart at 6 mg/day. Return 4/16 for protime. Marily Lente, Pharm.D.

## 2011-11-09 ENCOUNTER — Ambulatory Visit (HOSPITAL_BASED_OUTPATIENT_CLINIC_OR_DEPARTMENT_OTHER): Payer: Medicare Other | Admitting: Pharmacist

## 2011-11-09 ENCOUNTER — Other Ambulatory Visit: Payer: Medicare Other | Admitting: Lab

## 2011-11-09 DIAGNOSIS — Z86718 Personal history of other venous thrombosis and embolism: Secondary | ICD-10-CM

## 2011-11-09 DIAGNOSIS — Z862 Personal history of diseases of the blood and blood-forming organs and certain disorders involving the immune mechanism: Secondary | ICD-10-CM

## 2011-11-09 DIAGNOSIS — D6861 Antiphospholipid syndrome: Secondary | ICD-10-CM

## 2011-11-09 DIAGNOSIS — R760 Raised antibody titer: Secondary | ICD-10-CM

## 2011-11-09 DIAGNOSIS — D6859 Other primary thrombophilia: Secondary | ICD-10-CM

## 2011-11-09 DIAGNOSIS — R894 Abnormal immunological findings in specimens from other organs, systems and tissues: Secondary | ICD-10-CM

## 2011-11-09 LAB — POCT INR: INR: 2.1

## 2011-11-09 LAB — PROTIME-INR
INR: 2.1 (ref 2.00–3.50)
Protime: 25.2 Seconds — ABNORMAL HIGH (ref 10.6–13.4)

## 2011-11-09 NOTE — Progress Notes (Signed)
Pt had stopped fluconazole on 11/03/11 and was instructed to take coumadin 6mg  daily.  Will cont this dose and check PT/INR next week.  May need to increase to previous Coumadin dose of 6mg  daily 8mg  MWF, that pt was stable on prior to taking fluconazole.

## 2011-11-12 ENCOUNTER — Other Ambulatory Visit: Payer: Self-pay | Admitting: Oncology

## 2011-11-17 ENCOUNTER — Ambulatory Visit (HOSPITAL_BASED_OUTPATIENT_CLINIC_OR_DEPARTMENT_OTHER): Payer: Medicare Other | Admitting: Pharmacist

## 2011-11-17 ENCOUNTER — Other Ambulatory Visit: Payer: Medicare Other | Admitting: Lab

## 2011-11-17 DIAGNOSIS — R760 Raised antibody titer: Secondary | ICD-10-CM

## 2011-11-17 DIAGNOSIS — R894 Abnormal immunological findings in specimens from other organs, systems and tissues: Secondary | ICD-10-CM

## 2011-11-17 DIAGNOSIS — Z862 Personal history of diseases of the blood and blood-forming organs and certain disorders involving the immune mechanism: Secondary | ICD-10-CM

## 2011-11-17 DIAGNOSIS — Z86718 Personal history of other venous thrombosis and embolism: Secondary | ICD-10-CM

## 2011-11-17 DIAGNOSIS — D6859 Other primary thrombophilia: Secondary | ICD-10-CM

## 2011-11-17 DIAGNOSIS — D6861 Antiphospholipid syndrome: Secondary | ICD-10-CM

## 2011-11-17 LAB — PROTIME-INR
INR: 2.6 (ref 2.00–3.50)
Protime: 31.2 Seconds — ABNORMAL HIGH (ref 10.6–13.4)

## 2011-11-17 LAB — POCT INR: INR: 2.6

## 2011-11-17 NOTE — Patient Instructions (Signed)
Continue Coumadin 6mg  daily.  Check INR in 2 weeks on 12/01/11 at 11:00am; 11:15am Coumadin Clinic.

## 2011-11-17 NOTE — Progress Notes (Signed)
Continue Coumadin 6mg  daily.  Check INR in 2 weeks on 12/01/11 at 11:00am, 11:15am Coumadin Clinic.

## 2011-12-01 ENCOUNTER — Ambulatory Visit (HOSPITAL_BASED_OUTPATIENT_CLINIC_OR_DEPARTMENT_OTHER): Payer: Medicare Other | Admitting: Pharmacist

## 2011-12-01 ENCOUNTER — Other Ambulatory Visit (HOSPITAL_BASED_OUTPATIENT_CLINIC_OR_DEPARTMENT_OTHER): Payer: Medicare Other | Admitting: Lab

## 2011-12-01 DIAGNOSIS — D6861 Antiphospholipid syndrome: Secondary | ICD-10-CM

## 2011-12-01 DIAGNOSIS — R760 Raised antibody titer: Secondary | ICD-10-CM

## 2011-12-01 DIAGNOSIS — R894 Abnormal immunological findings in specimens from other organs, systems and tissues: Secondary | ICD-10-CM

## 2011-12-01 DIAGNOSIS — D6859 Other primary thrombophilia: Secondary | ICD-10-CM

## 2011-12-01 DIAGNOSIS — Z862 Personal history of diseases of the blood and blood-forming organs and certain disorders involving the immune mechanism: Secondary | ICD-10-CM

## 2011-12-01 DIAGNOSIS — Z86718 Personal history of other venous thrombosis and embolism: Secondary | ICD-10-CM

## 2011-12-01 LAB — PROTIME-INR
INR: 2 (ref 2.00–3.50)
Protime: 24 Seconds — ABNORMAL HIGH (ref 10.6–13.4)

## 2011-12-01 LAB — POCT INR: INR: 2

## 2011-12-01 NOTE — Progress Notes (Signed)
INR at goal on 6 mg/day. Pt reporting neuro sxs (see under pt findings) however, today seems to be at her baseline.  I don't suspect CVA.  Reviewed stroke sxs again w/ pt & she knows to go to ED if sxs worsen or do not improve. She plans to call Dr. Lajoyce Corners re: leg since he has done steroid inj in her knee in the past.   Cont same Coumadin dose. Return in 1 month. Marily Lente, Pharm.D.

## 2011-12-07 DIAGNOSIS — F410 Panic disorder [episodic paroxysmal anxiety] without agoraphobia: Secondary | ICD-10-CM | POA: Insufficient documentation

## 2011-12-16 ENCOUNTER — Telehealth: Payer: Self-pay | Admitting: Pharmacist

## 2011-12-16 NOTE — Telephone Encounter (Signed)
Pt called to let us know that her Xanax dose has been increased to 0.5 mg TID to hopefully help w/ anxiety & associated R sided weakness/pain.  Her PCP changed the dose after r/o CVA. Pt sees PCP again 12/21/11. Marily Lente, Pharm.D.

## 2011-12-22 ENCOUNTER — Other Ambulatory Visit: Payer: Self-pay | Admitting: Oncology

## 2011-12-22 DIAGNOSIS — Z1231 Encounter for screening mammogram for malignant neoplasm of breast: Secondary | ICD-10-CM

## 2011-12-22 DIAGNOSIS — Z9889 Other specified postprocedural states: Secondary | ICD-10-CM

## 2011-12-22 DIAGNOSIS — Z853 Personal history of malignant neoplasm of breast: Secondary | ICD-10-CM

## 2011-12-29 ENCOUNTER — Ambulatory Visit (HOSPITAL_BASED_OUTPATIENT_CLINIC_OR_DEPARTMENT_OTHER): Payer: Medicare Other | Admitting: Pharmacist

## 2011-12-29 ENCOUNTER — Other Ambulatory Visit (HOSPITAL_BASED_OUTPATIENT_CLINIC_OR_DEPARTMENT_OTHER): Payer: Medicare Other | Admitting: Lab

## 2011-12-29 DIAGNOSIS — Z86718 Personal history of other venous thrombosis and embolism: Secondary | ICD-10-CM

## 2011-12-29 DIAGNOSIS — R894 Abnormal immunological findings in specimens from other organs, systems and tissues: Secondary | ICD-10-CM

## 2011-12-29 DIAGNOSIS — R760 Raised antibody titer: Secondary | ICD-10-CM

## 2011-12-29 DIAGNOSIS — Z862 Personal history of diseases of the blood and blood-forming organs and certain disorders involving the immune mechanism: Secondary | ICD-10-CM

## 2011-12-29 DIAGNOSIS — D6859 Other primary thrombophilia: Secondary | ICD-10-CM

## 2011-12-29 DIAGNOSIS — D6861 Antiphospholipid syndrome: Secondary | ICD-10-CM

## 2011-12-29 LAB — PROTIME-INR
INR: 3 (ref 2.00–3.50)
Protime: 36 Seconds — ABNORMAL HIGH (ref 10.6–13.4)

## 2011-12-29 LAB — POCT INR: INR: 3

## 2011-12-29 NOTE — Progress Notes (Signed)
INR = 3 on 6 mg/day Doing better neurologically since increasing Xanax.  Less panic attacks & less numbness in her R side. No complaints re: anticoag. INR at goal so cont same dose. Return 1 month. Marily Lente, Pharm.D.

## 2012-01-07 ENCOUNTER — Ambulatory Visit: Payer: Medicare Other

## 2012-01-11 ENCOUNTER — Telehealth: Payer: Self-pay | Admitting: Pharmacist

## 2012-01-11 NOTE — Telephone Encounter (Signed)
Pt called to get our opinion on a red spot she has observed on her arm.  While working on her deck with her son, a large wrought-iron piece of metal fell on top of her, and did hit her in the arm where she and her son caught the piece.  She has some moderate bruising in the area, but she feels that this is not in excess of her injury.  She is more concerned with hot, itchy, red areas that are on her arm as well.  Informed pt that we could not really make a recommendation for her over other the phone - we cannot observe whether this looks like allergic dermatitis, a rash, cellulitis, etc.  Recommended that pt make an appt with her PCP to have the arm evaluated.  Pt communicated understanding, and will call us if anything new is prescribed since she is continuing on Coumadin therapy.  Her next scheduled INR is 01/25/12.

## 2012-01-11 NOTE — Telephone Encounter (Signed)
Pt called after her visit to her PCP.  She reports that she was diagnosed with cellulitis, and has been prescribed Cefdinir 300mg  BID x 10 days.  There is a slight risk that this cephalosporin may interact with her Coumadin, but it may not.  Her next scheduled INR is 01/25/12.  She should not need a repeat INR prior to this date.  We can assess whether her INR has remained stable after completing her antibiotic that day.  She will follow up with her PCP in 1 week to assess the treatment of her cellulitis, and she know to call her PCP if the redness and swelling gets worse.  I advised pt to use a sharpie or ink pen to trace around the affected area to assist in her at-home monitoring of the cellulitis.  Pt communicated understanding.

## 2012-01-25 ENCOUNTER — Ambulatory Visit (HOSPITAL_BASED_OUTPATIENT_CLINIC_OR_DEPARTMENT_OTHER): Payer: Medicare Other | Admitting: Pharmacist

## 2012-01-25 ENCOUNTER — Other Ambulatory Visit (HOSPITAL_BASED_OUTPATIENT_CLINIC_OR_DEPARTMENT_OTHER): Payer: Medicare Other | Admitting: Lab

## 2012-01-25 DIAGNOSIS — D6861 Antiphospholipid syndrome: Secondary | ICD-10-CM

## 2012-01-25 DIAGNOSIS — R760 Raised antibody titer: Secondary | ICD-10-CM

## 2012-01-25 DIAGNOSIS — Z86718 Personal history of other venous thrombosis and embolism: Secondary | ICD-10-CM

## 2012-01-25 DIAGNOSIS — Z862 Personal history of diseases of the blood and blood-forming organs and certain disorders involving the immune mechanism: Secondary | ICD-10-CM

## 2012-01-25 DIAGNOSIS — D6859 Other primary thrombophilia: Secondary | ICD-10-CM

## 2012-01-25 LAB — PROTIME-INR
INR: 1.6 — ABNORMAL LOW (ref 2.00–3.50)
Protime: 19.2 Seconds — ABNORMAL HIGH (ref 10.6–13.4)

## 2012-01-25 LAB — POCT INR: INR: 1.6

## 2012-01-25 NOTE — Progress Notes (Addendum)
INR = 1.6 on 6 mg/day Completed Cefdinir on 4/27 for R arm cellulitis.  She stated the area is much improved after abx. Cefdinir has potential to increase INR as a member of the cephalosporin class. Pt stated she has not missed any doses of her Coumadin. No sxs of VTE presently. INR is low today.  Dose of 8 mg x 1 today then back to 6 mg/day.  She has been therapeutic on 6 mg/day since April. Repeat INR in 2 weeks to see if she is back at goal INR. Pt will be having a tooth filled in the near future (not scheduled).  She asked if she needed to come off her Coumadin.  I informed her that since it is non-invasive, she does not need to stop her Coumadin for the filling. Marily Lente, Pharm.D.

## 2012-02-02 ENCOUNTER — Emergency Department (HOSPITAL_BASED_OUTPATIENT_CLINIC_OR_DEPARTMENT_OTHER)
Admission: EM | Admit: 2012-02-02 | Discharge: 2012-02-02 | Discharge status: Against Medical Advice | Payer: Medicare Other | Attending: Emergency Medicine | Admitting: Emergency Medicine

## 2012-02-02 ENCOUNTER — Encounter (HOSPITAL_BASED_OUTPATIENT_CLINIC_OR_DEPARTMENT_OTHER): Payer: Self-pay | Admitting: *Deleted

## 2012-02-02 DIAGNOSIS — M25519 Pain in unspecified shoulder: Secondary | ICD-10-CM | POA: Insufficient documentation

## 2012-02-02 NOTE — ED Notes (Signed)
Registration states pt LWBS  , daughter was mad d/t BP taken in her mothers right arm

## 2012-02-02 NOTE — ED Notes (Signed)
Pt states unable to take BP in left arm d/t shoulder pain and states unable to do BP in right arm D/t lumpectomy over 10 years ago, BP taking in Right arm d/t over ten years

## 2012-02-02 NOTE — ED Notes (Signed)
Pt c/o left shoulder and neck pain x 2 days

## 2012-02-03 ENCOUNTER — Telehealth: Payer: Self-pay | Admitting: Pharmacist

## 2012-02-03 ENCOUNTER — Encounter (HOSPITAL_COMMUNITY): Payer: Self-pay | Admitting: Emergency Medicine

## 2012-02-03 ENCOUNTER — Emergency Department (HOSPITAL_COMMUNITY): Payer: Medicare Other

## 2012-02-03 ENCOUNTER — Emergency Department (HOSPITAL_COMMUNITY)
Admission: EM | Admit: 2012-02-03 | Discharge: 2012-02-03 | Disposition: A | Discharge status: Home / Self Care | Payer: Medicare Other | Attending: Emergency Medicine | Admitting: Emergency Medicine

## 2012-02-03 DIAGNOSIS — Z86718 Personal history of other venous thrombosis and embolism: Secondary | ICD-10-CM | POA: Insufficient documentation

## 2012-02-03 DIAGNOSIS — E039 Hypothyroidism, unspecified: Secondary | ICD-10-CM | POA: Insufficient documentation

## 2012-02-03 DIAGNOSIS — M542 Cervicalgia: Secondary | ICD-10-CM | POA: Insufficient documentation

## 2012-02-03 DIAGNOSIS — Z8673 Personal history of transient ischemic attack (TIA), and cerebral infarction without residual deficits: Secondary | ICD-10-CM | POA: Insufficient documentation

## 2012-02-03 DIAGNOSIS — Z853 Personal history of malignant neoplasm of breast: Secondary | ICD-10-CM | POA: Insufficient documentation

## 2012-02-03 DIAGNOSIS — R079 Chest pain, unspecified: Secondary | ICD-10-CM | POA: Insufficient documentation

## 2012-02-03 DIAGNOSIS — Z79899 Other long term (current) drug therapy: Secondary | ICD-10-CM | POA: Insufficient documentation

## 2012-02-03 LAB — POCT I-STAT, CHEM 8
BUN: 9 mg/dL (ref 6–23)
Calcium, Ion: 1.18 mmol/L (ref 1.13–1.30)
Chloride: 102 mEq/L (ref 96–112)
Creatinine, Ser: 1.1 mg/dL (ref 0.50–1.10)
Glucose, Bld: 95 mg/dL (ref 70–99)
HCT: 37 % (ref 36.0–46.0)
Hemoglobin: 12.6 g/dL (ref 12.0–15.0)
Potassium: 3.7 mEq/L (ref 3.5–5.1)
Sodium: 141 mEq/L (ref 135–145)
TCO2: 28 mmol/L (ref 0–100)

## 2012-02-03 LAB — POCT I-STAT TROPONIN I: Troponin i, poc: 0 ng/mL (ref 0.00–0.08)

## 2012-02-03 MED ORDER — KETOROLAC TROMETHAMINE 30 MG/ML IJ SOLN
30.0000 mg | Freq: Once | INTRAMUSCULAR | Status: AC
  Administered 2012-02-03: 30 mg via INTRAVENOUS
  Filled 2012-02-03: qty 1

## 2012-02-03 MED ORDER — ONDANSETRON HCL 4 MG/2ML IJ SOLN
4.0000 mg | Freq: Once | INTRAMUSCULAR | Status: AC
  Administered 2012-02-03: 4 mg via INTRAVENOUS
  Filled 2012-02-03: qty 2

## 2012-02-03 MED ORDER — OXYCODONE-ACETAMINOPHEN 5-325 MG PO TABS: 1.0000 | ORAL_TABLET | Freq: Four times a day (QID) | ORAL | Status: AC | PRN

## 2012-02-03 MED ORDER — CYCLOBENZAPRINE HCL 10 MG PO TABS: 10.0000 mg | ORAL_TABLET | Freq: Two times a day (BID) | ORAL | Status: AC | PRN

## 2012-02-03 NOTE — ED Notes (Signed)
Pt reports CP and L neck and L arm pain since last Tuesday , pain has been a 10 out of 10 since Tuesday, Saw MD tues for CP but states MD never really told her anything about what was wrong.

## 2012-02-03 NOTE — Telephone Encounter (Signed)
Patient called in great amount of distress related to continued neck/shoulder/chest pain that seems to radiate in that order.  She seemed to be at the point of tears from the pain, and did go to the [facility] ER to have this evaluated last night.  According to this encounter's notes, the patient left without being seen since she was upset about her BP being taken on the side that her lumpectomy was performed.   She was told should not have any interventions done to that side.  She was very upset about this on the phone as well.  Ultimately, she wanted to know what the Coumadin Clinic thought she should do about her pain.  Given its acute onset, and her history of chest pain and cardiac catheterization, this type of pain could be suspicious for more serious cardiac ischemia.  I recommended that she return to the ER for further workup.  Pt confirmed that she would go, and that she herself was worried since her sister's massive heart attack presented similarly where she suffered from shoulder/back pain for almost 4 days.

## 2012-02-03 NOTE — ED Provider Notes (Addendum)
History     CSN: [id number]  Arrival date & time 02/03/12  1412   First MD Initiated Contact with Patient 02/03/12 1509      Chief Complaint  Patient presents with  . Chest Pain    (Consider location/radiation/quality/duration/timing/severity/associated sxs/prior treatment) HPI.... left lateral neck pain radiating to left anterior chest and left shoulder since 7 PM on Tuesday. No crushing substernal chest pain, dyspnea, diaphoresis, nausea. No previous myocardial infarction. The patient feels stiffness in these muscles.  Palpation makes symptoms worse. Pain is minimal to moderate. described as sharp  Past Medical History  Diagnosis Date  . Chest pain   . Hx-TIA (transient ischemic attack)   . Hypothyroidism   . Breast cancer   . History of DVT (deep vein thrombosis)   . History of antiphospholipid antibody syndrome     Past Surgical History  Procedure Date  . Cardiac catheterization 07/12/1997    NORMAL LEFT VENTRICULAR FUNCTION WITH EF AT LEAST 70-75%  . Thyroidectomy   . Left oophorectomy 1982  . Right oophorectomy 1980    No family history on file.  History  Substance Use Topics  . Smoking status: Never Smoker   . Smokeless tobacco: Not on file  . Alcohol Use: No    OB History    Grav Para Term Preterm Abortions TAB SAB Ect Mult Living                  Review of Systems  All other systems reviewed and are negative.    Allergies  Benadryl; Crestor; Daypro; Hornet venom; Levofloxacin; Levofloxacin; Sulfonamide derivatives; Vitamin k; and Zocor  Home Medications   Current Outpatient Rx  Name Route Sig Dispense Refill  . ALPRAZOLAM 1 MG PO TABS Oral Take 0.5 mg by mouth 3 (three) times daily.     . ASPIRIN 81 MG PO TABS Oral Take 81 mg by mouth daily.      . BUPROPION HCL ER (SR) 200 MG PO TB12 Oral Take 200 mg by mouth 2 (two) times daily.      [name] CALCIUM + D PO Oral Take 600 mg by mouth. Taking 1 Tablet Daily    . HYDROCODONE-ACETAMINOPHEN 5-500 MG  PO TABS Oral Take 0.5-1 tablets by mouth every 6 (six) hours as needed. For pain    . LEVOTHYROXINE SODIUM 88 MCG PO TABS Oral Take 88 mcg by mouth daily.    [name] METOPROLOL SUCCINATE ER 25 MG PO TB24 Oral Take 25 mg by mouth daily.      [name] NITROGLYCERIN 0.4 MG SL SUBL Sublingual Place 0.4 mg under the tongue every 5 (five) minutes as needed. For chest pain    . OMEPRAZOLE 20 MG PO CPDR Oral Take 20 mg by mouth 2 (two) times daily.      [name] PRAVASTATIN SODIUM 40 MG PO TABS Oral Take 40 mg by mouth at bedtime.     [name] PSEUDOEPHEDRINE HCL ER 240 MG PO TB24 Oral Take 1 tablet by mouth daily.    . WARFARIN SODIUM 4 MG PO TABS Oral Take 6 mg by mouth daily. Patient takes 1.5 tablets daily    . ZOLPIDEM TARTRATE 10 MG PO TABS Oral Take 5-10 mg by mouth at bedtime.     . CYCLOBENZAPRINE HCL 10 MG PO TABS Oral Take 1 tablet (10 mg total) by mouth 2 (two) times daily as needed for muscle spasms. 20 tablet 0  . OXYCODONE-ACETAMINOPHEN 5-325 MG PO TABS Oral Take 1-2 tablets by mouth  every 6 (six) hours as needed for pain. 20 tablet 0    BP 124/76  Pulse 62  Temp 98.2 F (36.8 C) (Oral)  Resp 13  SpO2 100%  Physical Exam  Nursing note and vitals reviewed. Constitutional: She is oriented to person, place, and time. She appears well-developed and well-nourished.  HENT:  Head: Normocephalic and atraumatic.  Eyes: Conjunctivae and EOM are normal. Pupils are equal, round, and reactive to light.  Neck: Normal range of motion. Neck supple.  Cardiovascular: Normal rate and regular rhythm.   Pulmonary/Chest: Effort normal and breath sounds normal.  Abdominal: Soft. Bowel sounds are normal.  Musculoskeletal:       Tender musculature left lateral neck, left anterior chest, left shoulder  Neurological: She is alert and oriented to person, place, and time.  Skin: Skin is warm and dry.  Psychiatric: She has a normal mood and affect.    ED Course  Procedures (including critical care time)   Labs Reviewed    POCT I-STAT, CHEM 8  POCT I-STAT TROPONIN I   Dg Chest 2 View  02/03/2012  *RADIOLOGY REPORT*  Clinical Data: History of pain in the left side of the neck and in the left upper chest.  Shortness of breath.  History of hypertension.  CHEST - 2 VIEW  Comparison: 07/21/2009 study.  Findings: Cardiac silhouette is normal size and shape. Ectasia and nonaneurysmal calcification of the thoracic aorta are seen. Mediastinal and hilar contours appear stable.  Surgical staples project over the lower right chest.  No pulmonary infiltrates or nodules were evident. No pleural abnormality is evident.  There is an osteopenic appearance of the bones.  There is minimal degenerative spondylosis.  IMPRESSION: No acute or active cardiopulmonary or pleural abnormalities are evident.  Stable chronic findings are described above.  Original Report Authenticated By: Crawford Givens, M.D.    Date: 03/01/2012  Rate: 64  Rhythm: normal sinus rhythm  QRS Axis: normal  Intervals: normal  ST/T Wave abnormalities: normal  Conduction Disutrbances: none  Narrative Interpretation: unremarkable     1. Neck pain   2. Chest pain       MDM  Suspect symptoms are more musculoskeletal. Screening test negative. Discharged on Percocet and Flexeril. Patient will followup with her primary care doctor        Donnetta Hutching, MD 02/03/12 1943  Donnetta Hutching, MD 03/01/12 1524

## 2012-02-07 ENCOUNTER — Telehealth: Payer: Self-pay | Admitting: Pharmacist

## 2012-02-08 ENCOUNTER — Ambulatory Visit (HOSPITAL_BASED_OUTPATIENT_CLINIC_OR_DEPARTMENT_OTHER): Payer: Medicare Other | Admitting: Pharmacist

## 2012-02-08 ENCOUNTER — Other Ambulatory Visit (HOSPITAL_BASED_OUTPATIENT_CLINIC_OR_DEPARTMENT_OTHER): Payer: Medicare Other | Admitting: Lab

## 2012-02-08 DIAGNOSIS — R760 Raised antibody titer: Secondary | ICD-10-CM

## 2012-02-08 DIAGNOSIS — Z86718 Personal history of other venous thrombosis and embolism: Secondary | ICD-10-CM

## 2012-02-08 DIAGNOSIS — Z862 Personal history of diseases of the blood and blood-forming organs and certain disorders involving the immune mechanism: Secondary | ICD-10-CM

## 2012-02-08 DIAGNOSIS — D6859 Other primary thrombophilia: Secondary | ICD-10-CM

## 2012-02-08 DIAGNOSIS — Z7901 Long term (current) use of anticoagulants: Secondary | ICD-10-CM

## 2012-02-08 DIAGNOSIS — D6861 Antiphospholipid syndrome: Secondary | ICD-10-CM

## 2012-02-08 LAB — PROTIME-INR
INR: 3.6 — ABNORMAL HIGH (ref 2.00–3.50)
Protime: 43.2 Seconds — ABNORMAL HIGH (ref 10.6–13.4)

## 2012-02-08 LAB — POCT INR: INR: 3.6

## 2012-02-08 NOTE — Progress Notes (Signed)
INR slightly elevated. ? secondary to Toradol received in ED on 02/03/12.  Pt did take Coumadin 8mg  x 1 dose on 01/25/12 then resumed 6mg  daily as instructed.  Pt will hold Coumadin today only. On 02/09/12, resume Coumadin 6mg  daily.   Check INR on 02/18/12 at 11 am for lab; 11:15am for Coumadin clinic.

## 2012-02-08 NOTE — Patient Instructions (Signed)
INR slightly elevated. Hold Coumadin today only. On 02/09/12, resume Coumadin 6mg  daily.   Check INR on 02/18/12 at 11 am for lab; 11:15am for Coumadin clinic.

## 2012-02-09 ENCOUNTER — Telehealth: Payer: Self-pay | Admitting: Pharmacist

## 2012-02-18 ENCOUNTER — Other Ambulatory Visit (HOSPITAL_BASED_OUTPATIENT_CLINIC_OR_DEPARTMENT_OTHER): Payer: Medicare Other | Admitting: Lab

## 2012-02-18 ENCOUNTER — Ambulatory Visit (HOSPITAL_BASED_OUTPATIENT_CLINIC_OR_DEPARTMENT_OTHER): Payer: Medicare Other | Admitting: Pharmacist

## 2012-02-18 DIAGNOSIS — D6861 Antiphospholipid syndrome: Secondary | ICD-10-CM

## 2012-02-18 DIAGNOSIS — Z86718 Personal history of other venous thrombosis and embolism: Secondary | ICD-10-CM

## 2012-02-18 DIAGNOSIS — R760 Raised antibody titer: Secondary | ICD-10-CM

## 2012-02-18 DIAGNOSIS — Z862 Personal history of diseases of the blood and blood-forming organs and certain disorders involving the immune mechanism: Secondary | ICD-10-CM

## 2012-02-18 DIAGNOSIS — D6859 Other primary thrombophilia: Secondary | ICD-10-CM

## 2012-02-18 LAB — PROTIME-INR
INR: 2.8 (ref 2.00–3.50)
Protime: 33.6 Seconds — ABNORMAL HIGH (ref 10.6–13.4)

## 2012-02-18 LAB — POCT INR: INR: 2.8

## 2012-03-05 ENCOUNTER — Emergency Department (HOSPITAL_COMMUNITY): Payer: Medicare Other

## 2012-03-05 ENCOUNTER — Emergency Department (HOSPITAL_COMMUNITY)
Admission: EM | Admit: 2012-03-05 | Discharge: 2012-03-05 | Disposition: A | Discharge status: Home / Self Care | Payer: Medicare Other | Attending: Emergency Medicine | Admitting: Emergency Medicine

## 2012-03-05 ENCOUNTER — Encounter (HOSPITAL_COMMUNITY): Payer: Self-pay | Admitting: Emergency Medicine

## 2012-03-05 DIAGNOSIS — E039 Hypothyroidism, unspecified: Secondary | ICD-10-CM | POA: Insufficient documentation

## 2012-03-05 DIAGNOSIS — Z86718 Personal history of other venous thrombosis and embolism: Secondary | ICD-10-CM | POA: Insufficient documentation

## 2012-03-05 DIAGNOSIS — Z79899 Other long term (current) drug therapy: Secondary | ICD-10-CM | POA: Insufficient documentation

## 2012-03-05 DIAGNOSIS — R51 Headache: Secondary | ICD-10-CM

## 2012-03-05 DIAGNOSIS — D6859 Other primary thrombophilia: Secondary | ICD-10-CM | POA: Insufficient documentation

## 2012-03-05 DIAGNOSIS — H1130 Conjunctival hemorrhage, unspecified eye: Secondary | ICD-10-CM

## 2012-03-05 DIAGNOSIS — Z8673 Personal history of transient ischemic attack (TIA), and cerebral infarction without residual deficits: Secondary | ICD-10-CM | POA: Insufficient documentation

## 2012-03-05 DIAGNOSIS — D689 Coagulation defect, unspecified: Secondary | ICD-10-CM

## 2012-03-05 LAB — CBC WITH DIFFERENTIAL/PLATELET
Basophils Absolute: 0 10*3/uL (ref 0.0–0.1)
Basophils Relative: 0 % (ref 0–1)
Eosinophils Absolute: 0.1 10*3/uL (ref 0.0–0.7)
Eosinophils Relative: 2 % (ref 0–5)
HCT: 37.1 % (ref 36.0–46.0)
Hemoglobin: 12.4 g/dL (ref 12.0–15.0)
Lymphocytes Relative: 25 % (ref 12–46)
Lymphs Abs: 1.6 10*3/uL (ref 0.7–4.0)
MCH: 31.3 pg (ref 26.0–34.0)
MCHC: 33.4 g/dL (ref 30.0–36.0)
MCV: 93.7 fL (ref 78.0–100.0)
Monocytes Absolute: 0.3 10*3/uL (ref 0.1–1.0)
Monocytes Relative: 5 % (ref 3–12)
Neutro Abs: 4.4 10*3/uL (ref 1.7–7.7)
Neutrophils Relative %: 68 % (ref 43–77)
Platelets: 229 10*3/uL (ref 150–400)
RBC: 3.96 MIL/uL (ref 3.87–5.11)
RDW: 13.5 % (ref 11.5–15.5)
WBC: 6.5 10*3/uL (ref 4.0–10.5)

## 2012-03-05 LAB — PROTIME-INR
INR: 2.89 — ABNORMAL HIGH (ref 0.00–1.49)
Prothrombin Time: 30.7 seconds — ABNORMAL HIGH (ref 11.6–15.2)

## 2012-03-05 LAB — BASIC METABOLIC PANEL
BUN: 13 mg/dL (ref 6–23)
CO2: 24 mEq/L (ref 19–32)
Calcium: 8.6 mg/dL (ref 8.4–10.5)
Chloride: 101 mEq/L (ref 96–112)
Creatinine, Ser: 1.21 mg/dL — ABNORMAL HIGH (ref 0.50–1.10)
GFR calc Af Amer: 54 mL/min — ABNORMAL LOW (ref >90)
GFR calc non Af Amer: 46 mL/min — ABNORMAL LOW (ref >90)
Glucose, Bld: 113 mg/dL — ABNORMAL HIGH (ref 70–99)
Potassium: 3.8 mEq/L (ref 3.5–5.1)
Sodium: 137 mEq/L (ref 135–145)

## 2012-03-05 NOTE — ED Provider Notes (Signed)
History     CSN: [id number]  Arrival date & time 03/05/12  1238   First MD Initiated Contact with Patient 03/05/12 1250      Chief Complaint  Patient presents with  . Coagulation Disorder    INR Check per Dr. Cyndie Chime    (Consider location/radiation/quality/duration/timing/severity/associated sxs/prior treatment) Patient is a 66 y.o. female presenting with eye problem. The history is provided by the patient.  Eye Problem  This is a new problem. The current episode started yesterday. The problem occurs constantly. The problem has been gradually worsening. There is pain in the left eye. There was no injury mechanism. Associated symptoms include nausea. Pertinent negatives include no photophobia and no vomiting. Associated symptoms comments: The patient was asked to come to the ED for evaluation of abnormal bleeding by Dr. Cyndie Chime. The patient has a history of coagulation disorder and is taking Coumadin for same, and who developed a subconjunctival hemorrhage last night that has become progressively worse today. No eye pain or visual change. She reports a mild to moderate headache that started this morning and is associated with nausea. She has had a history of "brain bleed" in the past with similar presentation that was right sided. No injury, fever, recent illness..    Past Medical History  Diagnosis Date  . Chest pain   . Hx-TIA (transient ischemic attack)   . Hypothyroidism   . Breast cancer   . History of DVT (deep vein thrombosis)   . History of antiphospholipid antibody syndrome     Past Surgical History  Procedure Date  . Cardiac catheterization 07/12/1997    NORMAL LEFT VENTRICULAR FUNCTION WITH EF AT LEAST 70-75%  . Thyroidectomy   . Left oophorectomy 1982  . Right oophorectomy 1980    No family history on file.  History  Substance Use Topics  . Smoking status: Never Smoker   . Smokeless tobacco: Not on file  . Alcohol Use: No    OB History    Grav Para  Term Preterm Abortions TAB SAB Ect Mult Living                  Review of Systems  Constitutional: Negative for fever.  Eyes: Negative for photophobia.       See HPI.  Respiratory: Negative for shortness of breath.   Cardiovascular: Negative for chest pain.  Gastrointestinal: Positive for nausea. Negative for vomiting and abdominal pain.  Neurological: Positive for headaches.    Allergies  Benadryl; Crestor; Daypro; Hornet venom; Levofloxacin; Levofloxacin; Sulfonamide derivatives; Vitamin k; and Zocor  Home Medications   Current Outpatient Rx  Name Route Sig Dispense Refill  . ALPRAZOLAM 1 MG PO TABS Oral Take 0.5 mg by mouth 3 (three) times daily.     . ASPIRIN 81 MG PO TABS Oral Take 81 mg by mouth daily.      . BUPROPION HCL ER (SR) 200 MG PO TB12 Oral Take 200 mg by mouth 2 (two) times daily.      [name] CALCIUM + D PO Oral Take 600 mg by mouth. Taking 1 Tablet Daily    . HYDROCODONE-ACETAMINOPHEN 5-500 MG PO TABS Oral Take 0.5-1 tablets by mouth every 6 (six) hours as needed. For pain    . LEVOTHYROXINE SODIUM 88 MCG PO TABS Oral Take 88 mcg by mouth daily.    [name] METHOCARBAMOL 500 MG PO TABS Oral Take 500 mg by mouth 3 (three) times daily.    [name] METOPROLOL SUCCINATE ER 25 MG PO TB24  Oral Take 25 mg by mouth daily.      [name] OMEPRAZOLE 20 MG PO CPDR Oral Take 20 mg by mouth 2 (two) times daily.      [name] PRAVASTATIN SODIUM 40 MG PO TABS Oral Take 40 mg by mouth at bedtime.     [name] PSEUDOEPHEDRINE HCL ER 240 MG PO TB24 Oral Take 1 tablet by mouth daily.    . WARFARIN SODIUM 4 MG PO TABS Oral Take 6 mg by mouth daily. Patient takes 1.5 tablets daily    . ZOLPIDEM TARTRATE 10 MG PO TABS Oral Take 5-10 mg by mouth at bedtime.       BP 136/59  Pulse 88  Resp 18  SpO2 100%  Physical Exam  Constitutional: She is oriented to person, place, and time. She appears well-developed and well-nourished.  HENT:  Head: Normocephalic.  Eyes: Pupils are equal, round, and reactive to light. Right eye  exhibits no discharge and no exudate. Left eye exhibits no discharge and no exudate. No foreign body present in the left eye. Right conjunctiva has no hemorrhage. Left conjunctiva is not injected. Left conjunctiva has a hemorrhage.  Neck: Normal range of motion. Neck supple.  Cardiovascular: Normal rate and regular rhythm.   Pulmonary/Chest: Effort normal and breath sounds normal.  Abdominal: Soft. Bowel sounds are normal. There is no tenderness. There is no rebound and no guarding.  Musculoskeletal: Normal range of motion.  Neurological: She is alert and oriented to person, place, and time. She has normal strength and normal reflexes. No sensory deficit. She displays a negative Romberg sign. Coordination normal.       The patient is ambulatory without ataxia or balance disturbance. Cranial nerves 3-12 grossly intact.  Skin: Skin is warm and dry. No rash noted.  Psychiatric: She has a normal mood and affect.    ED Course  Procedures (including critical care time)  Labs Reviewed  PROTIME-INR - Abnormal; Notable for the following:    Prothrombin Time 30.7 (*)     INR 2.89 (*)     All other components within normal limits  BASIC METABOLIC PANEL - Abnormal; Notable for the following:    Glucose, Bld 113 (*)     Creatinine, Ser 1.21 (*)     GFR calc non Af Amer 46 (*)     GFR calc Af Amer 54 (*)     All other components within normal limits  CBC WITH DIFFERENTIAL   No results found. Results for orders placed during the hospital encounter of 03/05/12  PROTIME-INR      Component Value Range   Prothrombin Time 30.7 (*) 11.6 - 15.2 seconds   INR 2.89 (*) 0.00 - 1.49  CBC WITH DIFFERENTIAL      Component Value Range   WBC 6.5  4.0 - 10.5 K/uL   RBC 3.96  3.87 - 5.11 MIL/uL   Hemoglobin 12.4  12.0 - 15.0 g/dL   HCT 14.7  82.9 - 56.2 %   MCV 93.7  78.0 - 100.0 fL   MCH 31.3  26.0 - 34.0 pg   MCHC 33.4  30.0 - 36.0 g/dL   RDW 13.0  86.5 - 78.4 %   Platelets 229  150 - 400 K/uL    Neutrophils Relative 68  43 - 77 %   Neutro Abs 4.4  1.7 - 7.7 K/uL   Lymphocytes Relative 25  12 - 46 %   Lymphs Abs 1.6  0.7 - 4.0 K/uL   Monocytes  Relative 5  3 - 12 %   Monocytes Absolute 0.3  0.1 - 1.0 K/uL   Eosinophils Relative 2  0 - 5 %   Eosinophils Absolute 0.1  0.0 - 0.7 K/uL   Basophils Relative 0  0 - 1 %   Basophils Absolute 0.0  0.0 - 0.1 K/uL  BASIC METABOLIC PANEL      Component Value Range   Sodium 137  135 - 145 mEq/L   Potassium 3.8  3.5 - 5.1 mEq/L   Chloride 101  96 - 112 mEq/L   CO2 24  19 - 32 mEq/L   Glucose, Bld 113 (*) 70 - 99 mg/dL   BUN 13  6 - 23 mg/dL   Creatinine, Ser 1.61 (*) 0.50 - 1.10 mg/dL   Calcium 8.6  8.4 - 09.6 mg/dL   GFR calc non Af Amer 46 (*) >90 mL/min   GFR calc Af Amer 54 (*) >90 mL/min   Ct Head Wo Contrast  03/05/2012  *RADIOLOGY REPORT*  Clinical Data: Lambert Mody left frontal headache  CT HEAD WITHOUT CONTRAST  Technique:  Contiguous axial images were obtained from the base of the skull through the vertex without contrast.  Comparison: 07/21/2009  Findings: No evidence of parenchymal hemorrhage or extra-axial fluid collection. No mass lesion, mass effect, or midline shift.  No CT evidence of acute infarction.  Cerebral volume is age appropriate.  No ventriculomegaly.  The visualized paranasal sinuses are essentially clear. The mastoid air cells are unopacified.  No evidence of calvarial fracture.  IMPRESSION: No evidence of acute intracranial abnormality.  Original Report Authenticated By: Charline Bills, M.D.    No diagnosis found.  1. Left subconjunctival hemorrhage 2. Coagulopathy 3. Nonspecific headache   MDM  The patient's blood studies are essentially normal, Coumadin is therapeutic. She has a normal neurologic exam and negative head CT. Do not feel LP is required to ensure no intracranial process. Patient will follow up with Dr. Cyndie Chime tomorrow.        Rodena Medin, PA-C 03/05/12 1518

## 2012-03-05 NOTE — ED Notes (Signed)
Pt states that she had a sharp pain in her head around 1200 today on the left front side of her head.  Since the pain, her sclera has become bloody.  Pt has hx antiphospholipid antibody syndrome.  Has had this happen to the other eye before and had to get 8 units of plasma and vitamin k.  Which she is allergic to.

## 2012-03-05 NOTE — ED Provider Notes (Signed)
Medical screening examination/treatment/procedure(s) were performed by non-physician practitioner and as supervising physician I was immediately available for consultation/collaboration.  Derwood Kaplan, MD 03/05/12 1721

## 2012-03-07 ENCOUNTER — Telehealth: Payer: Self-pay | Admitting: *Deleted

## 2012-03-07 NOTE — Telephone Encounter (Signed)
Received call from pt wanting Dr. Cyndie Chime to be aware of ED visit 03/05/12. "Do I need to come see him earlier than my appt on 9/23?"  Note to Dr. Cyndie Chime  1218 Spoke with pt letting her know Dr. Cyndie Chime aware and no need at this time for further evaluation from ED visit and keep appt for 04/17/12

## 2012-03-13 ENCOUNTER — Ambulatory Visit (HOSPITAL_BASED_OUTPATIENT_CLINIC_OR_DEPARTMENT_OTHER): Payer: Medicare Other | Admitting: Pharmacist

## 2012-03-13 DIAGNOSIS — D6859 Other primary thrombophilia: Secondary | ICD-10-CM

## 2012-03-13 DIAGNOSIS — R760 Raised antibody titer: Secondary | ICD-10-CM

## 2012-03-13 DIAGNOSIS — D6861 Antiphospholipid syndrome: Secondary | ICD-10-CM

## 2012-03-13 DIAGNOSIS — Z7901 Long term (current) use of anticoagulants: Secondary | ICD-10-CM

## 2012-03-13 LAB — POCT INR: INR: 3.5

## 2012-03-13 NOTE — Progress Notes (Signed)
INR drawn at her PCP's office today = 3.5 (supratherapeutic) Does have significant injuries r/t fall across coffee table.  Pt admits that the bruising is less than what she would have expected from the fall, despite her elevated INR.  No changes in meds or diet.  Has had to use more PRN pain medication.  Will have pt hold Coumadin today, then resume usual dose of 6mg  daily.   Pt will have repeat INR on Monday, 03/20/12.

## 2012-03-17 ENCOUNTER — Emergency Department (INDEPENDENT_AMBULATORY_CARE_PROVIDER_SITE_OTHER)
Admission: EM | Admit: 2012-03-17 | Discharge: 2012-03-17 | Disposition: A | Discharge status: Home / Self Care | Payer: Medicare Other | Source: Home / Self Care | Attending: Family Medicine | Admitting: Family Medicine

## 2012-03-17 ENCOUNTER — Emergency Department (INDEPENDENT_AMBULATORY_CARE_PROVIDER_SITE_OTHER): Payer: Medicare Other

## 2012-03-17 ENCOUNTER — Encounter (HOSPITAL_COMMUNITY): Payer: Self-pay | Admitting: Emergency Medicine

## 2012-03-17 DIAGNOSIS — S2239XA Fracture of one rib, unspecified side, initial encounter for closed fracture: Secondary | ICD-10-CM

## 2012-03-17 DIAGNOSIS — S8010XA Contusion of unspecified lower leg, initial encounter: Secondary | ICD-10-CM

## 2012-03-17 NOTE — ED Provider Notes (Signed)
History     CSN: [id number]  Arrival date & time 03/17/12  1526   First MD Initiated Contact with Patient 03/17/12 1618      Chief Complaint  Patient presents with  . Rib Injury    (Consider location/radiation/quality/duration/timing/severity/associated sxs/prior treatment) Patient is a 66 y.o. female presenting with fall. The history is provided by the patient.  Fall The accident occurred more than 1 week ago. The fall occurred while walking (tripped over table at home, mult contusions, , seen by lmd, no x-rays taken, pt concerned about left chest soreness.). Point of impact: chest. Pain location: left ant chest. The pain is moderate.    Past Medical History  Diagnosis Date  . Chest pain   . Hx-TIA (transient ischemic attack)   . Hypothyroidism   . Breast cancer   . History of DVT (deep vein thrombosis)   . History of antiphospholipid antibody syndrome     Past Surgical History  Procedure Date  . Cardiac catheterization 07/12/1997    NORMAL LEFT VENTRICULAR FUNCTION WITH EF AT LEAST 70-75%  . Thyroidectomy   . Left oophorectomy 1982  . Right oophorectomy 1980    History reviewed. No pertinent family history.  History  Substance Use Topics  . Smoking status: Never Smoker   . Smokeless tobacco: Not on file  . Alcohol Use: No    OB History    Grav Para Term Preterm Abortions TAB SAB Ect Mult Living                  Review of Systems  Constitutional: Negative.   Gastrointestinal: Negative.   Musculoskeletal: Negative.   Skin: Positive for wound.    Allergies  Benadryl; Crestor; Daypro; Hornet venom; Levofloxacin; Levofloxacin; Sulfonamide derivatives; Vitamin k; and Zocor  Home Medications   Current Outpatient Rx  Name Route Sig Dispense Refill  . ALPRAZOLAM 1 MG PO TABS Oral Take 0.5 mg by mouth 3 (three) times daily.     . ASPIRIN 81 MG PO TABS Oral Take 81 mg by mouth daily.      . BUPROPION HCL ER (SR) 200 MG PO TB12 Oral Take 200 mg by mouth 2  (two) times daily.      [name] CALCIUM + D PO Oral Take 600 mg by mouth. Taking 1 Tablet Daily    . HYDROCODONE-ACETAMINOPHEN 5-500 MG PO TABS Oral Take 0.5-1 tablets by mouth every 6 (six) hours as needed. For pain    . LEVOTHYROXINE SODIUM 88 MCG PO TABS Oral Take 88 mcg by mouth daily.    [name] METOPROLOL SUCCINATE ER 25 MG PO TB24 Oral Take 25 mg by mouth daily.      [name] OMEPRAZOLE 20 MG PO CPDR Oral Take 20 mg by mouth 2 (two) times daily.      [name] PRAVASTATIN SODIUM 40 MG PO TABS Oral Take 40 mg by mouth at bedtime.     . WARFARIN SODIUM 4 MG PO TABS Oral Take 6 mg by mouth daily. Patient takes 1.5 tablets daily    . ZOLPIDEM TARTRATE 10 MG PO TABS Oral Take 5-10 mg by mouth at bedtime.     . METHOCARBAMOL 500 MG PO TABS Oral Take 500 mg by mouth 3 (three) times daily.    [name] PSEUDOEPHEDRINE HCL ER 240 MG PO TB24 Oral Take 1 tablet by mouth daily.      BP 144/90  Pulse 78  Temp 97.8 F (36.6 C) (Oral)  Resp 18  SpO2 98%  Physical Exam  Nursing note and vitals reviewed. Constitutional: She is oriented to person, place, and time. She appears well-developed and well-nourished. She appears distressed.  HENT:  Head: Normocephalic.  Pulmonary/Chest: Effort normal and breath sounds normal.  Musculoskeletal:       Tender ecchymosis and abrasions to both lower legs, no visible chest trauma, tender left costal margin to palpation,   Neurological: She is alert and oriented to person, place, and time.  Skin: Skin is warm and dry.    ED Course  Procedures (including critical care time)  Labs Reviewed - No data to display Dg Ribs Unilateral W/chest Left  03/17/2012  *RADIOLOGY REPORT*  Clinical Data: Left rib pain since fall 03/10/2012  LEFT RIBS AND CHEST - 3+ VIEW  Comparison: Chest radiographs 02/03/2012  Findings: Normal heart size, mediastinal contours, and pulmonary vascularity. Atherosclerotic calcification aorta. Peribronchial thickening without infiltrate, pleural effusion or pneumothorax.  Surgical clips project over lower right chest and left upper quadrant. Osseous mineralization grossly normal. Minimally displaced fracture anterior left fifth rib.  IMPRESSION: Minimally displaced fracture anterior left fifth rib. Mild bronchitic changes.   Original Report Authenticated By: Lollie Marrow, M.D.      1. Rib fracture   2. Multiple leg contusions       MDM  X-rays reviewed and report per radiologist.         Linna Hoff, MD 03/17/12 [id number]

## 2012-03-17 NOTE — ED Notes (Addendum)
Pt fell over a coffee table last week; hit her left side on the coffee table, also hit both of her legs; was seen by PCP on 8/19 to assess injuries and told to follow up if pain gets worse; pain in pts left side has gotten worse since injury which is what brought her here;  Pain is in the pts upper left side. No difficulty breathing.

## 2012-03-20 ENCOUNTER — Other Ambulatory Visit (HOSPITAL_BASED_OUTPATIENT_CLINIC_OR_DEPARTMENT_OTHER): Payer: Medicare Other | Admitting: Lab

## 2012-03-20 ENCOUNTER — Ambulatory Visit (HOSPITAL_BASED_OUTPATIENT_CLINIC_OR_DEPARTMENT_OTHER): Payer: Medicare Other | Admitting: Pharmacist

## 2012-03-20 DIAGNOSIS — Z862 Personal history of diseases of the blood and blood-forming organs and certain disorders involving the immune mechanism: Secondary | ICD-10-CM

## 2012-03-20 DIAGNOSIS — D6861 Antiphospholipid syndrome: Secondary | ICD-10-CM

## 2012-03-20 DIAGNOSIS — Z86718 Personal history of other venous thrombosis and embolism: Secondary | ICD-10-CM

## 2012-03-20 DIAGNOSIS — R760 Raised antibody titer: Secondary | ICD-10-CM

## 2012-03-20 DIAGNOSIS — D6859 Other primary thrombophilia: Secondary | ICD-10-CM

## 2012-03-20 LAB — PROTIME-INR
INR: 2.6 (ref 2.00–3.50)
Protime: 31.2 Seconds — ABNORMAL HIGH (ref 10.6–13.4)

## 2012-03-20 NOTE — Progress Notes (Signed)
INR therapeutic today (2.6) after holding one dose last week. No changes in meds, diet.  No problems with bleeding besides the massive bruises on her lower legs from having a terrible fall over her coffee table that cracked a rib.  These bruises appear to be in the stages of healing, although they are still quite tender to her. Will continue current dose of 6mg  daily, and recheck INR in 2 weeks.

## 2012-04-03 ENCOUNTER — Ambulatory Visit (HOSPITAL_BASED_OUTPATIENT_CLINIC_OR_DEPARTMENT_OTHER): Payer: Medicare Other | Admitting: Pharmacist

## 2012-04-03 ENCOUNTER — Other Ambulatory Visit (HOSPITAL_BASED_OUTPATIENT_CLINIC_OR_DEPARTMENT_OTHER): Payer: Medicare Other | Admitting: Lab

## 2012-04-03 DIAGNOSIS — Z7901 Long term (current) use of anticoagulants: Secondary | ICD-10-CM

## 2012-04-03 DIAGNOSIS — D6859 Other primary thrombophilia: Secondary | ICD-10-CM

## 2012-04-03 DIAGNOSIS — D6861 Antiphospholipid syndrome: Secondary | ICD-10-CM

## 2012-04-03 DIAGNOSIS — R760 Raised antibody titer: Secondary | ICD-10-CM

## 2012-04-03 DIAGNOSIS — Z862 Personal history of diseases of the blood and blood-forming organs and certain disorders involving the immune mechanism: Secondary | ICD-10-CM

## 2012-04-03 DIAGNOSIS — Z86718 Personal history of other venous thrombosis and embolism: Secondary | ICD-10-CM

## 2012-04-03 LAB — PROTIME-INR
INR: 2.7 (ref 2.00–3.50)
Protime: 32.4 Seconds — ABNORMAL HIGH (ref 10.6–13.4)

## 2012-04-05 LAB — POCT INR: INR: 2.7

## 2012-04-05 NOTE — Progress Notes (Signed)
Pt  has no changes to report.  She is therapeutic today at 2.7. Her annual appmt is Mon, Sept 23.  We will see her on that day.  She does need a lab appmt for annual labs if MD needs/wants them.

## 2012-04-05 NOTE — Patient Instructions (Signed)
Pt  has no changes to report.  She is therapeutic today at 2.7. Her annual appmt is Mon, Sept 23.  We will see her on that day.  She does need a lab appmt for annual labs if MD needs/wants them. 

## 2012-04-17 ENCOUNTER — Other Ambulatory Visit (HOSPITAL_BASED_OUTPATIENT_CLINIC_OR_DEPARTMENT_OTHER): Payer: Medicare Other | Admitting: Lab

## 2012-04-17 ENCOUNTER — Encounter: Payer: Self-pay | Admitting: Oncology

## 2012-04-17 ENCOUNTER — Ambulatory Visit (HOSPITAL_BASED_OUTPATIENT_CLINIC_OR_DEPARTMENT_OTHER): Payer: Medicare Other | Admitting: Oncology

## 2012-04-17 ENCOUNTER — Ambulatory Visit (HOSPITAL_BASED_OUTPATIENT_CLINIC_OR_DEPARTMENT_OTHER): Payer: Medicare Other | Admitting: Pharmacist

## 2012-04-17 VITALS — BP 131/84 | HR 71 | Temp 97.1°F | Resp 18 | Ht 65.0 in | Wt 174.8 lb

## 2012-04-17 DIAGNOSIS — Z862 Personal history of diseases of the blood and blood-forming organs and certain disorders involving the immune mechanism: Secondary | ICD-10-CM

## 2012-04-17 DIAGNOSIS — Z5181 Encounter for therapeutic drug level monitoring: Secondary | ICD-10-CM

## 2012-04-17 DIAGNOSIS — R760 Raised antibody titer: Secondary | ICD-10-CM

## 2012-04-17 DIAGNOSIS — I821 Thrombophlebitis migrans: Secondary | ICD-10-CM

## 2012-04-17 DIAGNOSIS — Z7901 Long term (current) use of anticoagulants: Secondary | ICD-10-CM

## 2012-04-17 DIAGNOSIS — I742 Embolism and thrombosis of arteries of the upper extremities: Secondary | ICD-10-CM

## 2012-04-17 DIAGNOSIS — F411 Generalized anxiety disorder: Secondary | ICD-10-CM

## 2012-04-17 DIAGNOSIS — D6861 Antiphospholipid syndrome: Secondary | ICD-10-CM

## 2012-04-17 DIAGNOSIS — D6859 Other primary thrombophilia: Secondary | ICD-10-CM

## 2012-04-17 DIAGNOSIS — Z86718 Personal history of other venous thrombosis and embolism: Secondary | ICD-10-CM

## 2012-04-17 HISTORY — DX: Embolism and thrombosis of arteries of the upper extremities: I74.2

## 2012-04-17 LAB — POCT INR: INR: 1.5

## 2012-04-17 LAB — PROTIME-INR
INR: 1.5 — ABNORMAL LOW (ref 2.00–3.50)
Protime: 18 Seconds — ABNORMAL HIGH (ref 10.6–13.4)

## 2012-04-17 NOTE — Progress Notes (Signed)
Hematology and Oncology Follow Up Visit  [name] [id number] [birth year] 66 y.o. 04/17/2012 6:08 PM   Principle Diagnosis: Encounter Diagnoses  Name Primary?  . Breast cancer, stage 1, estrogen receptor positive Yes  . 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 Anticoagulant long-term use   . Thrombosis, upper extremity artery      Interim History:   Followup visit for this 66 year old woman who has a history of an arterial embolus to one of the fingers of her left hand in October 1998 as the first sign of antiphospholipid antibody syndrome.  She has been on chronic Coumadin anticoagulation with no subsequent thrombotic events.   She developed a stage I, ER-positive cancer of the right breast in December 1999, treated with lumpectomy, radiation, and then hormonal therapy with tamoxifen.  She developed a large hematoma at the lumpectomy site which then organized into dense tissue which had to be excised. She still has a dense scar in the right breast.  She had an upper endoscopy and colonoscopy by Dr. Charna Elizabeth on 03/09/2011.  There was some question of Barrett esophagus in the past, reported by another gastroenterologist.  However, Dr. Loreta Ave did not find any evidence of this on her study .  There were changes from prior Nissen fundoplication.  There was a questionable small patch of Barrett esophagus above the Z-line which was biopsied.  Biopsies returned showing focal mild active esophagitis but no evidence for Barrett esophagus.  She was started on an antireflux regimen.   She continues to have issues around chronic anxiety. She tells me she has become somewhat of a recluse. She does not want to go out. Her 40 year old son lives across the street from her. He has a metabolic bone disorder. Her daughter currently lives in [place] but is thinking of relocating back to the [place].  She tells her that she gets frequent panic attacks with circumoral paresthesias but is  not aware that she is hyperventilating. She reports intermittent hoarseness. Intermittent dizziness.  Medications: reviewed  Allergies:  Allergies  Allergen Reactions  . Benadryl (Diphenhydramine Hcl)     Rash  . Crestor (Rosuvastatin Calcium)     Muscle Aches  . Daypro (Oxaprozin)     Rash  . Hornet Venom     Anaphylaxis Shock  . Levofloxacin   . Levofloxacin     Rash  . Sulfonamide Derivatives   . Vitamin K   . Zocor (Simvastatin)     Muscle Aches    Review of Systems: Constitutional:   No constitutional symptoms Respiratory: No cough or dizziness Cardiovascular:  No chest pain or palpitations Gastrointestinal: No change in bowel habit Genito-Urinary: No vaginal bleeding Musculoskeletal: No bone pain Neurologic: No headache. Skin: No rash or ecchymoses Remaining ROS negative.  Physical Exam: Blood pressure 131/84, pulse 71, temperature 97.1 F (36.2 C), temperature source Oral, resp. rate 18, height 5\' 5"  (1.651 m), weight 174 lb 12.8 oz (79.289 kg). Wt Readings from Last 3 Encounters:  04/17/12 174 lb 12.8 oz (79.289 kg)  02/02/12 170 lb (77.111 kg)  07/22/11 176 lb (79.833 kg)     General appearance: Well-nourished Caucasian woman HENNT: Pharynx no erythema or exudate Lymph nodes: No cervical, supraclavicular, or axillary adenopathy Breasts: Dense scar 1:00 o'clock right breast unchanged no dominant mass in either breast Lungs: Clear to auscultation resonant to percussion Heart: Regular rhythm no murmur Abdomen: Soft nontender no mass no organomegaly Extremities: No edema no calf tenderness Vascular: No cyanosis.  Radial pulses 1+ symmetric Neurologic: Mental status intact, PERRLA, optic disc sharp and vessels normal, motor strength 5 over 5, reflexes absent but symmetric Skin: No rash or ecchymosis  Lab Results: Lab Results  Component Value Date   WBC 6.5 03/05/2012   HGB 12.4 03/05/2012   HCT 37.1 03/05/2012   MCV 93.7 03/05/2012   PLT 229 03/05/2012      Chemistry      Component Value Date/Time   NA 137 03/05/2012 1310   K 3.8 03/05/2012 1310   CL 101 03/05/2012 1310   CO2 24 03/05/2012 1310   BUN 13 03/05/2012 1310   CREATININE 1.21* 03/05/2012 1310      Component Value Date/Time   CALCIUM 8.6 03/05/2012 1310   ALKPHOS 88 04/28/2011 1343   ALKPHOS 88 04/28/2011 1343   AST 17 04/28/2011 1343   AST 17 04/28/2011 1343   ALT 16 04/28/2011 1343   ALT 16 04/28/2011 1343   BILITOT 0.6 04/28/2011 1343   BILITOT 0.6 04/28/2011 1343    Pro time/INR: 18/1.5   Radiological Studies: Last mammogram May 2012. She did not keep her appointment for the study this year but has not rescheduled it for next week.   Impression and Plan: #1. Antiphospholipid antibody syndrome  #2. Arterial embolus to a digit of her left hand secondary to #1.  #3. Chronic Coumadin anticoagulation due to #1 and #2. She is subtherapeutic today. Coumadin dose will be adjusted if necessary. She continues to have her Coumadin monitored through our office Coumadin clinic.  #4. Stage I ER positive cancer the right breast now out 14 years with no evidence for new disease  #5. Chronic anxiety and depression  #6. History of reflux esophagitis with history of previous Nissen fundoplication  #7. History of esophageal stricture secondary to #6  #8. Remote thyroid cancer status post thyroidectomy  #9. 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 Atypical neurologic symptoms. Previous extensive evaluation unremarkable. MRI/MRA, carotid Doppler studies, unremarkable done in 2010   CC:. Dr. [facility]; Dr. Alba Destine, MD 9/23/20136:08 PM

## 2012-04-17 NOTE — Patient Instructions (Signed)
Continue 6mg  daily.   Will recheck INR in 10 days on 04/26/12 with lab at 1pm and Coumadin Clinic at 1:15pm.

## 2012-04-17 NOTE — Progress Notes (Signed)
Continue 6mg  daily.  Will recheck INR in 10 days on 04/26/12 with lab at 1pm and Coumadin Clinic at 1:15pm to ensure INR returns to therapeutic range. Pt has been therapeutic and supratherapeutic on 6mg  daily.

## 2012-04-18 ENCOUNTER — Telehealth: Payer: Self-pay | Admitting: Oncology

## 2012-04-18 NOTE — Telephone Encounter (Signed)
s.w. pt and advised on oct/march appts....sed

## 2012-04-25 ENCOUNTER — Ambulatory Visit: Payer: Medicare Other

## 2012-04-26 ENCOUNTER — Other Ambulatory Visit (HOSPITAL_BASED_OUTPATIENT_CLINIC_OR_DEPARTMENT_OTHER): Payer: Medicare Other | Admitting: Lab

## 2012-04-26 ENCOUNTER — Ambulatory Visit (HOSPITAL_BASED_OUTPATIENT_CLINIC_OR_DEPARTMENT_OTHER): Payer: Medicare Other | Admitting: Pharmacist

## 2012-04-26 DIAGNOSIS — R894 Abnormal immunological findings in specimens from other organs, systems and tissues: Secondary | ICD-10-CM

## 2012-04-26 DIAGNOSIS — D6859 Other primary thrombophilia: Secondary | ICD-10-CM

## 2012-04-26 DIAGNOSIS — Z862 Personal history of diseases of the blood and blood-forming organs and certain disorders involving the immune mechanism: Secondary | ICD-10-CM

## 2012-04-26 DIAGNOSIS — R760 Raised antibody titer: Secondary | ICD-10-CM

## 2012-04-26 DIAGNOSIS — D6861 Antiphospholipid syndrome: Secondary | ICD-10-CM

## 2012-04-26 DIAGNOSIS — Z86718 Personal history of other venous thrombosis and embolism: Secondary | ICD-10-CM

## 2012-04-26 LAB — PROTIME-INR
INR: 2.8 (ref 2.00–3.50)
Protime: 33.6 Seconds — ABNORMAL HIGH (ref 10.6–13.4)

## 2012-04-26 LAB — POCT INR: INR: 2.8

## 2012-04-26 NOTE — Patient Instructions (Signed)
Continue same dose 6mg  daily.  RTC in 4 weeks on 05/24/12 at 1:30.

## 2012-04-26 NOTE — Progress Notes (Signed)
Pt back in therapeutic range at 2.8 on 6 mg daily.  Continue same dose and recheck INR in 4 weeks.

## 2012-05-24 ENCOUNTER — Other Ambulatory Visit (HOSPITAL_BASED_OUTPATIENT_CLINIC_OR_DEPARTMENT_OTHER): Payer: Medicare Other | Admitting: Lab

## 2012-05-24 ENCOUNTER — Ambulatory Visit: Payer: Medicare Other | Admitting: Pharmacist

## 2012-05-24 DIAGNOSIS — Z862 Personal history of diseases of the blood and blood-forming organs and certain disorders involving the immune mechanism: Secondary | ICD-10-CM

## 2012-05-24 DIAGNOSIS — Z86718 Personal history of other venous thrombosis and embolism: Secondary | ICD-10-CM

## 2012-05-24 DIAGNOSIS — R760 Raised antibody titer: Secondary | ICD-10-CM

## 2012-05-24 DIAGNOSIS — I742 Embolism and thrombosis of arteries of the upper extremities: Secondary | ICD-10-CM

## 2012-05-24 DIAGNOSIS — D6861 Antiphospholipid syndrome: Secondary | ICD-10-CM

## 2012-05-24 LAB — PROTIME-INR
INR: 2.4 (ref 2.00–3.50)
Protime: 28.8 Seconds — ABNORMAL HIGH (ref 10.6–13.4)

## 2012-05-24 NOTE — Progress Notes (Signed)
INR therapeutic today (2.4) on 6mg  daily. No changes in meds or diet.  No missed doses while away in [place], but did miss one dose of ASA 81mg .  Did have a capillary burst in her right eye on October 17th.  The sclera of her eye was red for about 5 days.  This has since resolved. No other complaints. Will have pt continue Coumadin 6mg  daily, and recheck INR in 4 weeks.

## 2012-05-31 ENCOUNTER — Ambulatory Visit
Admission: RE | Admit: 2012-05-31 | Discharge: 2012-05-31 | Disposition: A | Discharge status: Home / Self Care | Payer: Medicare Other | Source: Ambulatory Visit | Attending: Oncology | Admitting: Oncology

## 2012-05-31 DIAGNOSIS — Z9889 Other specified postprocedural states: Secondary | ICD-10-CM

## 2012-05-31 DIAGNOSIS — Z853 Personal history of malignant neoplasm of breast: Secondary | ICD-10-CM

## 2012-05-31 DIAGNOSIS — Z1231 Encounter for screening mammogram for malignant neoplasm of breast: Secondary | ICD-10-CM

## 2012-06-01 ENCOUNTER — Other Ambulatory Visit: Payer: Self-pay | Admitting: Oncology

## 2012-06-01 DIAGNOSIS — R928 Other abnormal and inconclusive findings on diagnostic imaging of breast: Secondary | ICD-10-CM

## 2012-06-20 ENCOUNTER — Ambulatory Visit
Admission: RE | Admit: 2012-06-20 | Discharge: 2012-06-20 | Disposition: A | Discharge status: Home / Self Care | Payer: Medicare Other | Source: Ambulatory Visit | Attending: Oncology | Admitting: Oncology

## 2012-06-20 DIAGNOSIS — R928 Other abnormal and inconclusive findings on diagnostic imaging of breast: Secondary | ICD-10-CM

## 2012-06-21 ENCOUNTER — Other Ambulatory Visit (HOSPITAL_BASED_OUTPATIENT_CLINIC_OR_DEPARTMENT_OTHER): Payer: Medicare Other | Admitting: Lab

## 2012-06-21 ENCOUNTER — Ambulatory Visit (HOSPITAL_BASED_OUTPATIENT_CLINIC_OR_DEPARTMENT_OTHER): Payer: Medicare Other | Admitting: Pharmacist

## 2012-06-21 DIAGNOSIS — Z862 Personal history of diseases of the blood and blood-forming organs and certain disorders involving the immune mechanism: Secondary | ICD-10-CM

## 2012-06-21 DIAGNOSIS — D6861 Antiphospholipid syndrome: Secondary | ICD-10-CM

## 2012-06-21 DIAGNOSIS — Z86718 Personal history of other venous thrombosis and embolism: Secondary | ICD-10-CM

## 2012-06-21 DIAGNOSIS — R760 Raised antibody titer: Secondary | ICD-10-CM

## 2012-06-21 DIAGNOSIS — D6859 Other primary thrombophilia: Secondary | ICD-10-CM

## 2012-06-21 LAB — PROTIME-INR
INR: 2.2 (ref 2.00–3.50)
Protime: 26.4 Seconds — ABNORMAL HIGH (ref 10.6–13.4)

## 2012-06-21 LAB — POCT INR: INR: 2.2

## 2012-06-21 NOTE — Patient Instructions (Addendum)
Continue 6mg  daily.  Will recheck INR in 3 weeks on 07/12/12 with lab at 1130 and Coumadin Clinic at 1145.

## 2012-06-21 NOTE — Progress Notes (Signed)
Pt's INR today is therapeutic at 2.2. Pt states no change in diet or symptoms of bleeding/clotting. Will continue current regimen of 6mg  daily and f/u INR in 3 weeks on Wednesday, July 12, 2012 at 11:30am for lab and 11:45 for coumadin clinic.

## 2012-07-12 ENCOUNTER — Ambulatory Visit (HOSPITAL_BASED_OUTPATIENT_CLINIC_OR_DEPARTMENT_OTHER): Payer: Medicare Other | Admitting: Pharmacist

## 2012-07-12 ENCOUNTER — Other Ambulatory Visit (HOSPITAL_BASED_OUTPATIENT_CLINIC_OR_DEPARTMENT_OTHER): Payer: Medicare Other | Admitting: Lab

## 2012-07-12 DIAGNOSIS — Z86718 Personal history of other venous thrombosis and embolism: Secondary | ICD-10-CM

## 2012-07-12 DIAGNOSIS — Z862 Personal history of diseases of the blood and blood-forming organs and certain disorders involving the immune mechanism: Secondary | ICD-10-CM

## 2012-07-12 DIAGNOSIS — D6861 Antiphospholipid syndrome: Secondary | ICD-10-CM

## 2012-07-12 DIAGNOSIS — Z7901 Long term (current) use of anticoagulants: Secondary | ICD-10-CM

## 2012-07-12 DIAGNOSIS — Z5181 Encounter for therapeutic drug level monitoring: Secondary | ICD-10-CM

## 2012-07-12 DIAGNOSIS — R894 Abnormal immunological findings in specimens from other organs, systems and tissues: Secondary | ICD-10-CM

## 2012-07-12 DIAGNOSIS — R760 Raised antibody titer: Secondary | ICD-10-CM

## 2012-07-12 DIAGNOSIS — D6859 Other primary thrombophilia: Secondary | ICD-10-CM

## 2012-07-12 LAB — PROTIME-INR
INR: 2.6 (ref 2.00–3.50)
Protime: 31.2 Seconds — ABNORMAL HIGH (ref 10.6–13.4)

## 2012-07-12 NOTE — Progress Notes (Signed)
INR therapeutic today (2.6) on 6mg  daily. No problems regarding her Coumadin.  No changes in meds or diet recently. Will continue current dose, and recheck INR in 1 month.

## 2012-07-13 ENCOUNTER — Other Ambulatory Visit: Payer: Self-pay | Admitting: Gastroenterology

## 2012-07-13 DIAGNOSIS — R1310 Dysphagia, unspecified: Secondary | ICD-10-CM

## 2012-07-25 ENCOUNTER — Ambulatory Visit
Admission: RE | Admit: 2012-07-25 | Discharge: 2012-07-25 | Disposition: A | Discharge status: Home / Self Care | Payer: Medicare Other | Source: Ambulatory Visit | Attending: Gastroenterology | Admitting: Gastroenterology

## 2012-07-25 DIAGNOSIS — R1310 Dysphagia, unspecified: Secondary | ICD-10-CM

## 2012-07-27 ENCOUNTER — Emergency Department (HOSPITAL_COMMUNITY)
Admission: EM | Admit: 2012-07-27 | Discharge: 2012-07-28 | Disposition: A | Discharge status: Home / Self Care | Payer: Medicare Other | Attending: Emergency Medicine | Admitting: Emergency Medicine

## 2012-07-27 ENCOUNTER — Encounter (HOSPITAL_COMMUNITY): Payer: Self-pay | Admitting: Emergency Medicine

## 2012-07-27 ENCOUNTER — Encounter: Payer: Self-pay | Admitting: Pharmacist

## 2012-07-27 DIAGNOSIS — Z86718 Personal history of other venous thrombosis and embolism: Secondary | ICD-10-CM | POA: Insufficient documentation

## 2012-07-27 DIAGNOSIS — Z79899 Other long term (current) drug therapy: Secondary | ICD-10-CM | POA: Insufficient documentation

## 2012-07-27 DIAGNOSIS — Z862 Personal history of diseases of the blood and blood-forming organs and certain disorders involving the immune mechanism: Secondary | ICD-10-CM | POA: Insufficient documentation

## 2012-07-27 DIAGNOSIS — Z8679 Personal history of other diseases of the circulatory system: Secondary | ICD-10-CM | POA: Insufficient documentation

## 2012-07-27 DIAGNOSIS — Z8673 Personal history of transient ischemic attack (TIA), and cerebral infarction without residual deficits: Secondary | ICD-10-CM | POA: Insufficient documentation

## 2012-07-27 DIAGNOSIS — Z7901 Long term (current) use of anticoagulants: Secondary | ICD-10-CM | POA: Insufficient documentation

## 2012-07-27 DIAGNOSIS — E039 Hypothyroidism, unspecified: Secondary | ICD-10-CM | POA: Insufficient documentation

## 2012-07-27 DIAGNOSIS — Z7982 Long term (current) use of aspirin: Secondary | ICD-10-CM | POA: Insufficient documentation

## 2012-07-27 DIAGNOSIS — Z853 Personal history of malignant neoplasm of breast: Secondary | ICD-10-CM | POA: Insufficient documentation

## 2012-07-27 DIAGNOSIS — I82409 Acute embolism and thrombosis of unspecified deep veins of unspecified lower extremity: Secondary | ICD-10-CM | POA: Insufficient documentation

## 2012-07-27 DIAGNOSIS — Z299 Encounter for prophylactic measures, unspecified: Secondary | ICD-10-CM

## 2012-07-27 LAB — COMPREHENSIVE METABOLIC PANEL
ALT: 17 U/L (ref 0–35)
AST: 21 U/L (ref 0–37)
Albumin: 4.2 g/dL (ref 3.5–5.2)
Alkaline Phosphatase: 103 U/L (ref 39–117)
BUN: 27 mg/dL — ABNORMAL HIGH (ref 6–23)
CO2: 25 mEq/L (ref 19–32)
Calcium: 9.2 mg/dL (ref 8.4–10.5)
Chloride: 97 mEq/L (ref 96–112)
Creatinine, Ser: 1.35 mg/dL — ABNORMAL HIGH (ref 0.50–1.10)
GFR calc Af Amer: 47 mL/min — ABNORMAL LOW (ref >90)
GFR calc non Af Amer: 40 mL/min — ABNORMAL LOW (ref >90)
Glucose, Bld: 136 mg/dL — ABNORMAL HIGH (ref 70–99)
Potassium: 4.4 mEq/L (ref 3.5–5.1)
Sodium: 133 mEq/L — ABNORMAL LOW (ref 135–145)
Total Bilirubin: 0.3 mg/dL (ref 0.3–1.2)
Total Protein: 7.7 g/dL (ref 6.0–8.3)

## 2012-07-27 LAB — CBC WITH DIFFERENTIAL/PLATELET
Basophils Absolute: 0.1 10*3/uL (ref 0.0–0.1)
Basophils Relative: 1 % (ref 0–1)
Eosinophils Absolute: 0.3 10*3/uL (ref 0.0–0.7)
Eosinophils Relative: 2 % (ref 0–5)
HCT: 40.7 % (ref 36.0–46.0)
Hemoglobin: 13.9 g/dL (ref 12.0–15.0)
Lymphocytes Relative: 27 % (ref 12–46)
Lymphs Abs: 2.8 10*3/uL (ref 0.7–4.0)
MCH: 31.2 pg (ref 26.0–34.0)
MCHC: 34.2 g/dL (ref 30.0–36.0)
MCV: 91.3 fL (ref 78.0–100.0)
Monocytes Absolute: 0.6 10*3/uL (ref 0.1–1.0)
Monocytes Relative: 6 % (ref 3–12)
Neutro Abs: 6.7 10*3/uL (ref 1.7–7.7)
Neutrophils Relative %: 64 % (ref 43–77)
Platelets: 281 10*3/uL (ref 150–400)
RBC: 4.46 MIL/uL (ref 3.87–5.11)
RDW: 13.6 % (ref 11.5–15.5)
WBC: 10.4 10*3/uL (ref 4.0–10.5)

## 2012-07-27 LAB — POCT INR: INR: 1.63

## 2012-07-27 LAB — PROTIME-INR
INR: 1.63 — ABNORMAL HIGH (ref 0.00–1.49)
Prothrombin Time: 18.8 seconds — ABNORMAL HIGH (ref 11.6–15.2)

## 2012-07-27 MED ORDER — ENOXAPARIN SODIUM 80 MG/0.8ML ~~LOC~~ SOLN
80.0000 mg | Freq: Once | SUBCUTANEOUS | Status: AC
  Administered 2012-07-28: 80 mg via SUBCUTANEOUS
  Filled 2012-07-27: qty 0.8

## 2012-07-27 NOTE — ED Notes (Signed)
Pt alert, arrives from home, c/o "knot" behind right knee, onset was this evening, per pt, hx of blood clots, denies chest pain or SOB, sent per PCP, resp even unlabored, skin pwd

## 2012-07-27 NOTE — ED Notes (Signed)
PMS intact to right lower extremity

## 2012-07-27 NOTE — ED Provider Notes (Signed)
History     CSN: [id number]  Arrival date & time 07/27/12  2119   First MD Initiated Contact with Patient 07/27/12 2226      Chief Complaint  Patient presents with  . Leg Pain    (Consider location/radiation/quality/duration/timing/severity/associated sxs/prior treatment) HPI Comments: Felt knot behinf R knee 2 days ago has appointment with PCP in AM but was too worried to wait   HS Hx DVT on Coumadin  The history is provided by the patient.    Past Medical History  Diagnosis Date  . Chest pain   . Hx-TIA (transient ischemic attack)   . Hypothyroidism   . Breast cancer   . History of DVT (deep vein thrombosis)   . History of antiphospholipid antibody syndrome   . Thrombosis, upper extremity artery 04/17/2012    Left digital artery  October 1998 - new dx antiphospholipid antibody syndrome    Past Surgical History  Procedure Date  . Cardiac catheterization 07/12/1997    NORMAL LEFT VENTRICULAR FUNCTION WITH EF AT LEAST 70-75%  . Thyroidectomy   . Left oophorectomy 1982  . Right oophorectomy 1980    No family history on file.  History  Substance Use Topics  . Smoking status: Never Smoker   . Smokeless tobacco: Not on file  . Alcohol Use: No    OB History    Grav Para Term Preterm Abortions TAB SAB Ect Mult Living                  Review of Systems  Constitutional: Negative for activity change.  Respiratory: Negative for shortness of breath.   Cardiovascular: Negative for leg swelling.  Gastrointestinal: Negative.   Genitourinary: Negative.   Musculoskeletal: Negative for joint swelling.  Neurological: Negative for weakness and numbness.    Allergies  Benadryl; Crestor; Daypro; Hornet venom; Levofloxacin; Levofloxacin; Sulfonamide derivatives; Vitamin k; and Zocor  Home Medications   Current Outpatient Rx  Name  Route  Sig  Dispense  Refill  . ALPRAZOLAM 1 MG PO TABS   Oral   Take 0.5 mg by mouth 3 (three) times daily.          . ASPIRIN 81 MG  PO TABS   Oral   Take 81 mg by mouth daily.           . BUPROPION HCL ER (SR) 200 MG PO TB12   Oral   Take 200 mg by mouth 2 (two) times daily.           [name] CALCIUM + D PO   Oral   Take 600 mg by mouth. Taking 1 Tablet Daily         . HYDROCODONE-ACETAMINOPHEN 5-325 MG PO TABS   Oral   Take 1 tablet by mouth every 6 (six) hours as needed. pain         . LEVOTHYROXINE SODIUM 100 MCG PO TABS   Oral   Take 100 mcg by mouth daily.         [name] METOPROLOL SUCCINATE ER 25 MG PO TB24   Oral   Take 25 mg by mouth daily.           [name] OMEPRAZOLE 20 MG PO CPDR   Oral   Take 20 mg by mouth 2 (two) times daily.           [name] PRAVASTATIN SODIUM 40 MG PO TABS   Oral   Take 40 mg by mouth at bedtime.          [name]  TIZANIDINE HCL 4 MG PO CAPS   Oral   Take 4 mg by mouth every 6 (six) hours as needed. Muscle pain         . WARFARIN SODIUM 4 MG PO TABS   Oral   Take 6 mg by mouth daily. Patient takes 1.5 tablets daily         . ZOLPIDEM TARTRATE 10 MG PO TABS   Oral   Take 5-10 mg by mouth at bedtime.            BP 148/90  Pulse 80  Temp 97 F (36.1 C) (Oral)  Resp 16  SpO2 99%  Physical Exam  Constitutional: She appears well-developed.  Eyes: Pupils are equal, round, and reactive to light.  Neck: Normal range of motion.  Cardiovascular: Normal rate.   Pulmonary/Chest: Effort normal. No respiratory distress. She exhibits no tenderness.  Abdominal: Soft.  Musculoskeletal: She exhibits tenderness. She exhibits no edema.       Firm "knot behind R knee no leg swelling negative Homans sign   Neurological: She is alert.  Skin: Skin is warm.    ED Course  Procedures (including critical care time)  Labs Reviewed  COMPREHENSIVE METABOLIC PANEL - Abnormal; Notable for the following:    Sodium 133 (*)     Glucose, Bld 136 (*)     BUN 27 (*)     Creatinine, Ser 1.35 (*)     GFR calc non Af Amer 40 (*)     GFR calc Af Amer 47 (*)     All other components  within normal limits  PROTIME-INR - Abnormal; Notable for the following:    Prothrombin Time 18.8 (*)     INR 1.63 (*)     All other components within normal limits  CBC WITH DIFFERENTIAL  LAB REPORT - SCANNED   No results found.   1. DVT prophylaxis       MDM   subtheraputic INR will discuss with PCP in AM        Arman Filter, NP 07/29/12 [phone]

## 2012-07-27 NOTE — ED Provider Notes (Signed)
Complains of pain at right popliteal fossa onset today described as "a knot behind my right knee. Patient also reports she had a vague discomfort at right lower chest/upper abdomen today which lasted for 5 minutes and resolve spontaneously. On exam patient is in no distress abdomen soft nontender lungs clear auscultation right lower extremity tender at popliteal fossa no swelling no redness, neurovascularly intact  Doug Sou, MD 07/27/12 2350

## 2012-07-27 NOTE — Progress Notes (Signed)
Pt called to let pharmacy know she is now on Zanaflex for "feet twisting"/spasms There is no interaction w/ Coumadin. Pt may be having surgery to repair a hernia sometime in the near future.  She is awaiting surgical consult w/ Dr. Larena Sox (sp?) Marily Lente, Pharm.D.

## 2012-07-28 ENCOUNTER — Ambulatory Visit: Payer: Self-pay | Admitting: Pharmacist

## 2012-07-28 ENCOUNTER — Ambulatory Visit (HOSPITAL_COMMUNITY)
Admission: RE | Admit: 2012-07-28 | Discharge: 2012-07-28 | Disposition: A | Discharge status: Home / Self Care | Payer: Medicare Other | Source: Ambulatory Visit | Attending: Family Medicine | Admitting: Family Medicine

## 2012-07-28 ENCOUNTER — Telehealth: Payer: Self-pay | Admitting: Pharmacist

## 2012-07-28 DIAGNOSIS — D6861 Antiphospholipid syndrome: Secondary | ICD-10-CM

## 2012-07-28 DIAGNOSIS — R760 Raised antibody titer: Secondary | ICD-10-CM

## 2012-07-28 DIAGNOSIS — Z86718 Personal history of other venous thrombosis and embolism: Secondary | ICD-10-CM | POA: Insufficient documentation

## 2012-07-28 DIAGNOSIS — M79609 Pain in unspecified limb: Secondary | ICD-10-CM | POA: Insufficient documentation

## 2012-07-28 DIAGNOSIS — C50919 Malignant neoplasm of unspecified site of unspecified female breast: Secondary | ICD-10-CM

## 2012-07-28 NOTE — Progress Notes (Signed)
Right:  No evidence of DVT, superficial thrombosis, or Baker's cyst.  Left:  Negative for DVT in the common femoral vein.  

## 2012-07-28 NOTE — Progress Notes (Signed)
Pt called pharmacy today & reports she went to ED at [facility] last night w/ pain & a "knot" behind her knee. INR = 1.63 yesterday  Pt reports her most recent weight = 172 # Pt has been on Coumadin 6 mg/day maintenance since April 2013 with a few INR's that have been outside range.   No obvious cause for the INR now being subtherapeutic as pt reports she has not missed any doses. She is now on Zanaflex; she called yesterday to tell us about the med change.  However, this drug is not known to affect the INR. Doppler today (per pt) was negative for clot.  Pt states she was told during the exam that there could have been a cyst there. I have discussed w/ Dr. Cyndie Chime & plan is for pt to start Lovenox 1.5 mg/kg/day today & continue until INR at goal. Pt will increase her Coumadin today only to 8 mg then back to 6 mg/day. Check INR next Wednesday 08/02/12. I provided pt w/ 5 syringes of Lovenox 120 mg today.  Marily Lente, Pharm.D.

## 2012-07-30 NOTE — ED Provider Notes (Signed)
Medical screening examination/treatment/procedure(s) were performed by non-physician practitioner and as supervising physician I was immediately available for consultation/collaboration.  John-Adam Cesare Sumlin, M.D.     John-Adam Jocelyn Lowery, MD 07/30/12 1123 

## 2012-08-02 ENCOUNTER — Other Ambulatory Visit (HOSPITAL_BASED_OUTPATIENT_CLINIC_OR_DEPARTMENT_OTHER): Payer: Medicare Other | Admitting: Lab

## 2012-08-02 ENCOUNTER — Ambulatory Visit (HOSPITAL_BASED_OUTPATIENT_CLINIC_OR_DEPARTMENT_OTHER): Payer: Medicare Other | Admitting: Pharmacist

## 2012-08-02 DIAGNOSIS — R894 Abnormal immunological findings in specimens from other organs, systems and tissues: Secondary | ICD-10-CM

## 2012-08-02 DIAGNOSIS — Z86718 Personal history of other venous thrombosis and embolism: Secondary | ICD-10-CM

## 2012-08-02 DIAGNOSIS — R760 Raised antibody titer: Secondary | ICD-10-CM

## 2012-08-02 DIAGNOSIS — D6861 Antiphospholipid syndrome: Secondary | ICD-10-CM

## 2012-08-02 DIAGNOSIS — D6859 Other primary thrombophilia: Secondary | ICD-10-CM

## 2012-08-02 DIAGNOSIS — Z862 Personal history of diseases of the blood and blood-forming organs and certain disorders involving the immune mechanism: Secondary | ICD-10-CM

## 2012-08-02 LAB — PROTIME-INR
INR: 1.8 — ABNORMAL LOW (ref 2.00–3.50)
Protime: 21.6 Seconds — ABNORMAL HIGH (ref 10.6–13.4)

## 2012-08-02 LAB — POCT INR: INR: 1.8

## 2012-08-02 NOTE — Progress Notes (Signed)
Pt complains of knot on left hip.  Examined by LT and told it is a resolving hematoma.  Although she does not remember bumping into anything LT told her it will not take much being on coumadin and lovenox to cause a bruise.  Increase to 8 mg daily and Lovenox 120 mg SQ daily today & tomorrow.  Will recheck INR on 08/04/12 with lab at 1:45 am and clinic at 2 pm

## 2012-08-02 NOTE — Patient Instructions (Addendum)
Increase to 8 mg daily and Lovenox 120 mg SQ daily today & tomorrow.  Will recheck INR on 08/04/12 with lab at 1:45 am and clinic at 2 pm

## 2012-08-04 ENCOUNTER — Other Ambulatory Visit (HOSPITAL_BASED_OUTPATIENT_CLINIC_OR_DEPARTMENT_OTHER): Payer: Medicare Other | Admitting: Lab

## 2012-08-04 ENCOUNTER — Ambulatory Visit (HOSPITAL_BASED_OUTPATIENT_CLINIC_OR_DEPARTMENT_OTHER): Payer: Medicare Other | Admitting: Pharmacist

## 2012-08-04 DIAGNOSIS — Z862 Personal history of diseases of the blood and blood-forming organs and certain disorders involving the immune mechanism: Secondary | ICD-10-CM

## 2012-08-04 DIAGNOSIS — D6861 Antiphospholipid syndrome: Secondary | ICD-10-CM

## 2012-08-04 DIAGNOSIS — R894 Abnormal immunological findings in specimens from other organs, systems and tissues: Secondary | ICD-10-CM

## 2012-08-04 DIAGNOSIS — R760 Raised antibody titer: Secondary | ICD-10-CM

## 2012-08-04 DIAGNOSIS — Z86718 Personal history of other venous thrombosis and embolism: Secondary | ICD-10-CM

## 2012-08-04 DIAGNOSIS — D6859 Other primary thrombophilia: Secondary | ICD-10-CM

## 2012-08-04 LAB — PROTIME-INR
INR: 2.3 (ref 2.00–3.50)
Protime: 27.6 Seconds — ABNORMAL HIGH (ref 10.6–13.4)

## 2012-08-04 LAB — POCT INR: INR: 2.3

## 2012-08-04 NOTE — Patient Instructions (Signed)
Stop using lovenox shots.  Take Coumadin 6 mg daily and recheck your INR in 2 weeks on 08/04/12 with lab at 1:45 am and clinic at 2 pm

## 2012-08-04 NOTE — Progress Notes (Signed)
Pt now therarpeutic at 2.3.  Instructed her to stop using lovenox shots.  Take Coumadin 6 mg daily and recheck your INR in 2 weeks on 08/17/12 with lab at 1:45 am and clinic at 2 pm.  Pt still complains of bruise on hip and I told her she could ice if she felt it would help.  She request to come back in 2 weeks.  She was hesitant for [name] to go out further than that appmt span.

## 2012-08-09 ENCOUNTER — Ambulatory Visit: Payer: Medicare Other

## 2012-08-09 ENCOUNTER — Other Ambulatory Visit: Payer: Medicare Other | Admitting: Lab

## 2012-08-17 ENCOUNTER — Other Ambulatory Visit: Payer: Medicare Other | Admitting: Lab

## 2012-08-17 ENCOUNTER — Ambulatory Visit (HOSPITAL_BASED_OUTPATIENT_CLINIC_OR_DEPARTMENT_OTHER): Payer: Medicare Other | Admitting: Pharmacist

## 2012-08-17 DIAGNOSIS — D6861 Antiphospholipid syndrome: Secondary | ICD-10-CM

## 2012-08-17 DIAGNOSIS — Z862 Personal history of diseases of the blood and blood-forming organs and certain disorders involving the immune mechanism: Secondary | ICD-10-CM

## 2012-08-17 DIAGNOSIS — Z86718 Personal history of other venous thrombosis and embolism: Secondary | ICD-10-CM

## 2012-08-17 DIAGNOSIS — D6859 Other primary thrombophilia: Secondary | ICD-10-CM

## 2012-08-17 DIAGNOSIS — R894 Abnormal immunological findings in specimens from other organs, systems and tissues: Secondary | ICD-10-CM

## 2012-08-17 DIAGNOSIS — R760 Raised antibody titer: Secondary | ICD-10-CM

## 2012-08-17 LAB — POCT INR: INR: 2.7

## 2012-08-17 LAB — PROTIME-INR
INR: 2.7 (ref 2.00–3.50)
Protime: 32.4 Seconds — ABNORMAL HIGH (ref 10.6–13.4)

## 2012-08-17 NOTE — Progress Notes (Signed)
Continue taking Coumadin 6mg daily and recheck your INR in 2 weeks on 08/31/12 with lab at 1:30pm and coumadin clinic at 1:45 pm. 

## 2012-08-17 NOTE — Patient Instructions (Addendum)
Continue taking Coumadin 6mg  daily and recheck your INR in 2 weeks on 08/31/12 with lab at 1:30pm and coumadin clinic at 1:45 pm.

## 2012-08-18 ENCOUNTER — Ambulatory Visit (INDEPENDENT_AMBULATORY_CARE_PROVIDER_SITE_OTHER): Payer: Medicare Other | Admitting: Surgery

## 2012-08-18 ENCOUNTER — Encounter (INDEPENDENT_AMBULATORY_CARE_PROVIDER_SITE_OTHER): Payer: Self-pay | Admitting: Surgery

## 2012-08-18 VITALS — BP 144/86 | HR 60 | Resp 16 | Ht 65.0 in | Wt 176.0 lb

## 2012-08-18 DIAGNOSIS — Z09 Encounter for follow-up examination after completed treatment for conditions other than malignant neoplasm: Secondary | ICD-10-CM

## 2012-08-18 DIAGNOSIS — R1310 Dysphagia, unspecified: Secondary | ICD-10-CM

## 2012-08-18 DIAGNOSIS — Z9889 Other specified postprocedural states: Secondary | ICD-10-CM | POA: Insufficient documentation

## 2012-08-18 NOTE — Progress Notes (Signed)
Chief Complaint:  History of Barrett's esophagus and some intermittent dysphagia  History of Present Illness:  [name] is an 67 y.o. female  Who had an open Nissen fundoplication by Dr. Mardella Layman in 1998. She's had a very complicated medical history since then including antiphospholipid syndrome with upper extremity emboli, breast cancer, and some recent problems with intermittent dysphagia going back to last summer after endoscopy. She denies any solid food regurgitation at night but really has some proximal syndrome complaints of intermittent hoarseness and change that she feels going on in her throat. She's had a previous total thyroidectomy back in the late 70s. This was for goiters.   A look at her upper GI series to look like she may have a small type II paraesophageal hiatal hernia and a guess in addition to a slider. She's not obstructed. Repeat surgery would involve higher risk in her and ask her to think about that. Also the gastric to look at her symptomatology and would sit back down in 6 weeks and discuss whether we want to do any surgical intervention.  Past Medical History  Diagnosis Date  . Chest pain   . Hx-TIA (transient ischemic attack)   . Hypothyroidism   . Breast cancer   . History of DVT (deep vein thrombosis)   . History of antiphospholipid antibody syndrome   . 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 Arthritis   . Hyperlipidemia   . Neuromuscular disorder     Past Surgical History  Procedure Date  . Cardiac catheterization 07/12/1997    NORMAL LEFT VENTRICULAR FUNCTION WITH EF AT LEAST 70-75%  . Thyroidectomy   . Left oophorectomy 1982  . Right oophorectomy 1980  . Abdominal hysterectomy     Current Outpatient Prescriptions  Medication Sig Dispense Refill  . ALPRAZolam (XANAX) 1 MG tablet Take 0.5 mg by mouth 3 (three) times daily.       [name] aspirin 81 MG tablet  Take 81 mg by mouth daily.        [name] buPROPion (WELLBUTRIN SR) 200 MG 12 hr tablet Take 200 mg by mouth 2 (two) times daily.        . Calcium Carbonate-Vitamin D (CALCIUM + D PO) Take 600 mg by mouth. Taking 1 Tablet Daily Tablet contains Magnesium and Zinc.      . HYDROcodone-acetaminophen (NORCO/VICODIN) 5-325 MG per tablet Take 1 tablet by mouth every 6 (six) hours as needed. pain      . levothyroxine (SYNTHROID, LEVOTHROID) 100 MCG tablet Take 100 mcg by mouth daily.      . metoprolol succinate (TOPROL-XL) 25 MG 24 hr tablet Take 25 mg by mouth daily.        [name] omeprazole (PRILOSEC) 20 MG capsule Take 20 mg by mouth 2 (two) times daily.        . pravastatin (PRAVACHOL) 40 MG tablet Take 40 mg by mouth at bedtime.       [name] tiZANidine (ZANAFLEX) 4 MG capsule Take 4 mg by mouth every 6 (six) hours as needed. Muscle pain      . warfarin (COUMADIN) 4 MG tablet Take 6 mg by mouth daily. Patient takes 1.5 tablets daily      . zolpidem (AMBIEN) 10 MG tablet Take 5-10 mg by mouth at bedtime.       . [DISCONTINUED] fluticasone (FLONASE) 50 MCG/ACT nasal spray Place 2 sprays into the nose daily.  Benadryl; Crestor; Daypro; Hornet venom; Levofloxacin; Levofloxacin; Sulfonamide derivatives; Vitamin k; and Zocor Family History  Problem Relation Age of Onset  . Cancer Maternal Grandmother     colon   Social History:   reports that she has never smoked. She has never used smokeless tobacco. She reports that she does not drink alcohol or use illicit drugs.   REVIEW OF SYSTEMS - PERTINENT POSITIVES ONLY: Noncontributory  Physical Exam:   Blood pressure 144/86, pulse 60, resp. rate 16, height 5\' 5"  (1.651 m), weight 176 lb (79.833 kg). Body mass index is 29.29 kg/(m^2).  Gen:  WDWN White female NAD  Neurological: Alert and oriented to person, place, and time. Motor and sensory function is grossly intact  Head: Normocephalic and atraumatic.  Eyes: Conjunctivae are normal. Pupils are equal,  round, and reactive to light. No scleral icterus.  Neck: Normal range of motion. Neck supple. No tracheal deviation or thyromegaly present.  Cardiovascular:  SR without murmurs or gallops.  No carotid bruits Respiratory: Effort normal.  No respiratory distress. No chest wall tenderness. Breath sounds normal.  No wheezes, rales or rhonchi.  Abdomen:  Nontender GU: Musculoskeletal: Normal range of motion. Extremities are nontender. No cyanosis, edema or clubbing noted Lymphadenopathy: No cervical, preauricular, postauricular or axillary adenopathy is present Skin: Skin is warm and dry. No rash noted. No diaphoresis. No erythema. No pallor. Pscyh: Normal mood and affect. Behavior is normal. Judgment and thought content normal.   LABORATORY RESULTS: Results for orders placed in visit on 08/17/12 (from the past 48 hour(s))  POCT INR     Status: Normal      Component Value Range Comment   INR 2.7       RADIOLOGY RESULTS: No results found.  Problem List: Patient Active Problem List  Diagnosis  . Chest pain  . Hx-TIA (transient ischemic attack)  . Hypothyroidism  . Breast cancer  . History of DVT (deep vein thrombosis)  . History of antiphospholipid antibody syndrome  . Antiphospholipid antibody syndrome  . Lupus anticoagulant positive  . Thrombosis, upper extremity artery  . S/P Open Nissen 1998 Dr. Mardella Layman    Assessment & Plan: History of prior open Nissen fundoplication for short segment Barrett's. Upper GI evidence of a wrap Nissen. Will consider redo laparoscopic Nissen fundoplication. Will recheck her in 6 weeks for further discussion.    [name], MD, [facility] [phone] beeper [phone]  08/18/2012 11:57 AM

## 2012-08-28 ENCOUNTER — Telehealth: Payer: Self-pay | Admitting: Pharmacist

## 2012-08-30 NOTE — Telephone Encounter (Signed)
Pt called to let us know that she will be starting:  Doxycycline 100mg  po BID x 10 days & Benzonatate 100mg  po BIDPRN - TIDPRN.  She is scheduled to have her INR checked on 08/31/12. She is aware that Doxycycline may increase her INR.

## 2012-08-30 NOTE — Telephone Encounter (Signed)
Pt started Doxycycline and Benzonatate (not Benzotropine as documented in previous telephone encounter) on 08/28/12

## 2012-08-31 ENCOUNTER — Other Ambulatory Visit (HOSPITAL_BASED_OUTPATIENT_CLINIC_OR_DEPARTMENT_OTHER): Payer: Medicare Other | Admitting: Lab

## 2012-08-31 ENCOUNTER — Ambulatory Visit (HOSPITAL_BASED_OUTPATIENT_CLINIC_OR_DEPARTMENT_OTHER): Payer: Medicare Other | Admitting: Pharmacist

## 2012-08-31 DIAGNOSIS — D6859 Other primary thrombophilia: Secondary | ICD-10-CM

## 2012-08-31 DIAGNOSIS — D6861 Antiphospholipid syndrome: Secondary | ICD-10-CM

## 2012-08-31 DIAGNOSIS — R760 Raised antibody titer: Secondary | ICD-10-CM

## 2012-08-31 DIAGNOSIS — Z86718 Personal history of other venous thrombosis and embolism: Secondary | ICD-10-CM

## 2012-08-31 DIAGNOSIS — Z862 Personal history of diseases of the blood and blood-forming organs and certain disorders involving the immune mechanism: Secondary | ICD-10-CM

## 2012-08-31 LAB — PROTIME-INR
INR: 2.7 (ref 2.00–3.50)
Protime: 32.4 Seconds — ABNORMAL HIGH (ref 10.6–13.4)

## 2012-08-31 LAB — POCT INR: INR: 2.7

## 2012-08-31 NOTE — Progress Notes (Signed)
INR = 2.7 on 6 mg/day Pt has acute sinusitis; now on day 3 of 10 day course of Doxycycline. There are reports of increased effects of Warfarin when used in combination w/ Doxycyline & some cases of bleeding. INR today is therapeutic.  I am hesitant to empirically decrease [name]'s dose of Coumadin since her INR's are variable w/ her underlying condition. Rather I will keep her Coumadin the same & recheck her INR again on Tues 2/11. Pt will call if she notices sxs of over-anticoagulation. [name], Pharm.D.

## 2012-09-05 ENCOUNTER — Ambulatory Visit (HOSPITAL_BASED_OUTPATIENT_CLINIC_OR_DEPARTMENT_OTHER): Payer: Medicare Other | Admitting: Pharmacist

## 2012-09-05 ENCOUNTER — Other Ambulatory Visit (HOSPITAL_BASED_OUTPATIENT_CLINIC_OR_DEPARTMENT_OTHER): Payer: Medicare Other | Admitting: Lab

## 2012-09-05 DIAGNOSIS — D6859 Other primary thrombophilia: Secondary | ICD-10-CM

## 2012-09-05 DIAGNOSIS — R760 Raised antibody titer: Secondary | ICD-10-CM

## 2012-09-05 DIAGNOSIS — D6861 Antiphospholipid syndrome: Secondary | ICD-10-CM

## 2012-09-05 DIAGNOSIS — Z862 Personal history of diseases of the blood and blood-forming organs and certain disorders involving the immune mechanism: Secondary | ICD-10-CM

## 2012-09-05 DIAGNOSIS — Z86718 Personal history of other venous thrombosis and embolism: Secondary | ICD-10-CM

## 2012-09-05 LAB — POCT INR: INR: 2.8

## 2012-09-05 LAB — PROTIME-INR
INR: 2.8 (ref 2.00–3.50)
Protime: 33.6 Seconds — ABNORMAL HIGH (ref 10.6–13.4)

## 2012-09-05 NOTE — Patient Instructions (Signed)
Continue taking Coumadin 6 mg daily and recheck your INR on 09/26/12 with lab at 1 pm and coumadin clinic at 1:15 pm.

## 2012-09-05 NOTE — Progress Notes (Signed)
Pt seen today with sister in the room.  She is therapeutic today at 2.8 on 6mg  daily. Has 2 days left of doxycycline for sinusitis and taking tess pearls for cough.  She says the hematoma in her leg is not improving and she plans to call MD about it. Continue taking Coumadin 6 mg daily and recheck your INR on 09/26/12 with lab at 1 pm and coumadin clinic at 1:15 pm.

## 2012-09-12 ENCOUNTER — Telehealth: Payer: Self-pay | Admitting: Pharmacist

## 2012-09-12 NOTE — Telephone Encounter (Signed)
Pt called and said she was starting Amoxicillin for a tooth abcess. She will be on it for at least one month. We discussed that this antibiotic may slightly increase her INR but should have minimal effect on her INR. Recheck her INR on 2/26 @ 11am.

## 2012-09-20 ENCOUNTER — Other Ambulatory Visit (HOSPITAL_BASED_OUTPATIENT_CLINIC_OR_DEPARTMENT_OTHER): Payer: Medicare Other | Admitting: Lab

## 2012-09-20 ENCOUNTER — Ambulatory Visit (HOSPITAL_BASED_OUTPATIENT_CLINIC_OR_DEPARTMENT_OTHER): Payer: Medicare Other | Admitting: Pharmacist

## 2012-09-20 ENCOUNTER — Other Ambulatory Visit: Payer: Self-pay | Admitting: Pharmacist

## 2012-09-20 DIAGNOSIS — I82409 Acute embolism and thrombosis of unspecified deep veins of unspecified lower extremity: Secondary | ICD-10-CM

## 2012-09-20 DIAGNOSIS — D6859 Other primary thrombophilia: Secondary | ICD-10-CM

## 2012-09-20 DIAGNOSIS — R760 Raised antibody titer: Secondary | ICD-10-CM

## 2012-09-20 DIAGNOSIS — D6861 Antiphospholipid syndrome: Secondary | ICD-10-CM

## 2012-09-20 DIAGNOSIS — R894 Abnormal immunological findings in specimens from other organs, systems and tissues: Secondary | ICD-10-CM

## 2012-09-20 DIAGNOSIS — Z7901 Long term (current) use of anticoagulants: Secondary | ICD-10-CM

## 2012-09-20 LAB — PROTIME-INR
INR: 2.4 (ref 2.00–3.50)
Protime: 28.8 Seconds — ABNORMAL HIGH (ref 10.6–13.4)

## 2012-09-20 LAB — POCT INR: INR: 2.4

## 2012-09-20 NOTE — Progress Notes (Signed)
Pt still c/o of hematoma on leg.  Has appt with [name] Stabs, NP at [facility] in [place] on 3/4 and will discuss with her.  INR continues to be stable on Coumadin 6mg  daily.  Will check PT/INR in 37month.  [name] may need to have procedure on esophagus.  Will call to let us know and instruct her on anticoag plan if needed.

## 2012-09-21 ENCOUNTER — Other Ambulatory Visit: Payer: Self-pay | Admitting: Oncology

## 2012-09-21 ENCOUNTER — Telehealth: Payer: Self-pay | Admitting: *Deleted

## 2012-09-21 NOTE — Telephone Encounter (Signed)
AWAITING PT.'S CALL.

## 2012-09-22 ENCOUNTER — Telehealth: Payer: Self-pay | Admitting: *Deleted

## 2012-09-22 NOTE — Telephone Encounter (Signed)
DR.GRANFORTUNA SIGNED PRESCRIPTION FOR MASTECTOMY BRAS AND IT WAS SENT TO [facility].

## 2012-09-26 ENCOUNTER — Other Ambulatory Visit: Payer: Medicare Other | Admitting: Lab

## 2012-09-26 ENCOUNTER — Ambulatory Visit: Payer: Medicare Other

## 2012-09-27 ENCOUNTER — Other Ambulatory Visit: Payer: Self-pay | Admitting: Family Medicine

## 2012-09-27 DIAGNOSIS — R2242 Localized swelling, mass and lump, left lower limb: Secondary | ICD-10-CM

## 2012-09-28 ENCOUNTER — Other Ambulatory Visit: Payer: Medicare Other

## 2012-09-28 ENCOUNTER — Ambulatory Visit
Admission: RE | Admit: 2012-09-28 | Discharge: 2012-09-28 | Disposition: A | Discharge status: Home / Self Care | Payer: Medicare Other | Source: Ambulatory Visit | Attending: Family Medicine | Admitting: Family Medicine

## 2012-09-28 ENCOUNTER — Other Ambulatory Visit (INDEPENDENT_AMBULATORY_CARE_PROVIDER_SITE_OTHER): Payer: Self-pay

## 2012-09-28 ENCOUNTER — Ambulatory Visit (INDEPENDENT_AMBULATORY_CARE_PROVIDER_SITE_OTHER): Payer: Medicare Other | Admitting: Surgery

## 2012-09-28 VITALS — BP 146/84 | HR 84 | Temp 98.4°F | Resp 18 | Ht 65.0 in | Wt 176.4 lb

## 2012-09-28 DIAGNOSIS — R1310 Dysphagia, unspecified: Secondary | ICD-10-CM

## 2012-09-28 DIAGNOSIS — R2242 Localized swelling, mass and lump, left lower limb: Secondary | ICD-10-CM

## 2012-09-28 DIAGNOSIS — Z9889 Other specified postprocedural states: Secondary | ICD-10-CM

## 2012-09-28 NOTE — Progress Notes (Signed)
[name] 67 y.o.  Body mass index is 29.35 kg/(m^2).  Patient Active Problem List  Diagnosis  . Chest pain  . Hx-TIA (transient ischemic attack)  . Hypothyroidism  . Breast cancer  . History of DVT (deep vein thrombosis)  . History of antiphospholipid antibody syndrome  . Antiphospholipid antibody syndrome  . Lupus anticoagulant positive  . Thrombosis, upper extremity artery  . S/P Open Nissen 1998 Dr. Mardella Layman    Allergies  Allergen Reactions  . Benadryl (Diphenhydramine Hcl)     Rash  . Crestor (Rosuvastatin Calcium)     Muscle Aches  . Daypro (Oxaprozin)     Rash  . Hornet Venom     Anaphylaxis Shock  . Levofloxacin   . Levofloxacin     Rash  . Sulfonamide Derivatives   . Vitamin K   . Zocor (Simvastatin)     Muscle Aches    Past Surgical History  Procedure Laterality Date  . Cardiac catheterization  07/12/1997    NORMAL LEFT VENTRICULAR FUNCTION WITH EF AT LEAST 70-75%  . Thyroidectomy    . Left oophorectomy  1982  . Right oophorectomy  1980  . Abdominal hysterectomy     KAPLAN,KRISTEN, PA-C No diagnosis found.  She would like to proceed toward redo Nissen fundoplication.  She has stuff hanging up in her throat at times.  Before scheduling surgery I want to obtain an esophageal manometry.  Will see back after that.  [name], MD, [facility] [phone] beeper [phone]  09/28/2012 10:50 AM

## 2012-10-10 ENCOUNTER — Telehealth: Payer: Self-pay | Admitting: Pharmacist

## 2012-10-10 NOTE — Progress Notes (Signed)
Spoke to Ms. Fetting via telephone call. She evidently bumped her calf on the car door and has a good sized hematoma.  She has put ice on it for the past 24 hours and I instructed her to use some moist heat to help resolve.  I instructed her to call us immediately if it gets any worse.

## 2012-10-16 ENCOUNTER — Ambulatory Visit (HOSPITAL_BASED_OUTPATIENT_CLINIC_OR_DEPARTMENT_OTHER): Payer: Medicare Other | Admitting: Nurse Practitioner

## 2012-10-16 ENCOUNTER — Other Ambulatory Visit (HOSPITAL_BASED_OUTPATIENT_CLINIC_OR_DEPARTMENT_OTHER): Payer: Medicare Other | Admitting: Lab

## 2012-10-16 VITALS — BP 142/71 | HR 64 | Temp 97.7°F | Resp 18 | Ht 65.0 in | Wt 174.0 lb

## 2012-10-16 DIAGNOSIS — C50919 Malignant neoplasm of unspecified site of unspecified female breast: Secondary | ICD-10-CM

## 2012-10-16 DIAGNOSIS — D6859 Other primary thrombophilia: Secondary | ICD-10-CM

## 2012-10-16 DIAGNOSIS — Z7901 Long term (current) use of anticoagulants: Secondary | ICD-10-CM

## 2012-10-16 DIAGNOSIS — I748 Embolism and thrombosis of other arteries: Secondary | ICD-10-CM

## 2012-10-16 DIAGNOSIS — R760 Raised antibody titer: Secondary | ICD-10-CM

## 2012-10-16 DIAGNOSIS — I742 Embolism and thrombosis of arteries of the upper extremities: Secondary | ICD-10-CM

## 2012-10-16 DIAGNOSIS — Z862 Personal history of diseases of the blood and blood-forming organs and certain disorders involving the immune mechanism: Secondary | ICD-10-CM

## 2012-10-16 LAB — CBC WITH DIFFERENTIAL/PLATELET
BASO%: 1.1 % (ref 0.0–2.0)
Basophils Absolute: 0.1 10*3/uL (ref 0.0–0.1)
EOS%: 2.2 % (ref 0.0–7.0)
Eosinophils Absolute: 0.2 10*3/uL (ref 0.0–0.5)
HCT: 35.6 % (ref 34.8–46.6)
HGB: 12 g/dL (ref 11.6–15.9)
LYMPH%: 29.8 % (ref 14.0–49.7)
MCH: 31.3 pg (ref 25.1–34.0)
MCHC: 33.7 g/dL (ref 31.5–36.0)
MCV: 92.8 fL (ref 79.5–101.0)
MONO#: 0.5 10*3/uL (ref 0.1–0.9)
MONO%: 7.3 % (ref 0.0–14.0)
NEUT#: 4.4 10*3/uL (ref 1.5–6.5)
NEUT%: 59.6 % (ref 38.4–76.8)
Platelets: 216 10*3/uL (ref 145–400)
RBC: 3.84 10*6/uL (ref 3.70–5.45)
RDW: 14.2 % (ref 11.2–14.5)
WBC: 7.4 10*3/uL (ref 3.9–10.3)
lymph#: 2.2 10*3/uL (ref 0.9–3.3)

## 2012-10-16 LAB — COMPREHENSIVE METABOLIC PANEL (CC13)
ALT: 16 U/L (ref 0–55)
AST: 17 U/L (ref 5–34)
Albumin: 3.7 g/dL (ref 3.5–5.0)
Alkaline Phosphatase: 88 U/L (ref 40–150)
BUN: 16 mg/dL (ref 7.0–26.0)
CO2: 28 mEq/L (ref 22–29)
Calcium: 9.3 mg/dL (ref 8.4–10.4)
Chloride: 103 mEq/L (ref 98–107)
Creatinine: 1.2 mg/dL — ABNORMAL HIGH (ref 0.6–1.1)
Glucose: 107 mg/dl — ABNORMAL HIGH (ref 70–99)
Potassium: 5.3 mEq/L — ABNORMAL HIGH (ref 3.5–5.1)
Sodium: 139 mEq/L (ref 136–145)
Total Bilirubin: 0.47 mg/dL (ref 0.20–1.20)
Total Protein: 6.8 g/dL (ref 6.4–8.3)

## 2012-10-16 NOTE — Progress Notes (Signed)
OFFICE PROGRESS NOTE  Interval history:  [name] a 67 year old woman with a history of an arterial embolus to one of the fingers of the left hand in October 1998 as the first sign of antiphospholipid antibody syndrome. She has been maintained on chronic Coumadin anticoagulation with no subsequent thrombotic events. She was diagnosed with a stage I ER positive cancer of the right breast in December 1999 treated with lumpectomy, radiation and hormonal therapy with tamoxifen. Mammogram 06/20/2012 showed no evidence of malignancy.  She continues Coumadin. She denies any bleeding. Last week she struck the right lower inner leg on the car door and developed a bruise/hematoma. The bruise/hematoma is resolving.  Over the past several days she has had intermittent pain in the region of the left collarbone/shoulder. She has taken Vicodin with improvement. She feels the pain is disposition related. She notes the pain increases when she lays flat.  On labs today her potassium level was mildly elevated at 5.3. She thinks that she recently began an over-the-counter potassium supplement.   Objective: Blood pressure 142/71, pulse 64, temperature 97.7 F (36.5 C), temperature source Oral, resp. rate 18, height 5\' 5"  (1.651 m), weight 174 lb (78.926 kg).  Oropharynx is without thrush or ulceration. No palpable cervical, supraclavicular or axillary lymph nodes. Lungs are clear. Regular cardiac rhythm. Abdomen is soft and nontender. No organomegaly. Extremities are without edema. Motor strength 5 over 5. Nontender over the left collarbone.  Lab Results: Lab Results  Component Value Date   WBC 7.4 10/16/2012   HGB 12.0 10/16/2012   HCT 35.6 10/16/2012   MCV 92.8 10/16/2012   PLT 216 10/16/2012    Chemistry:    Chemistry      Component Value Date/Time   NA 139 10/16/2012 1116   NA 133* 07/27/2012 2235   K 5.3* 10/16/2012 1116   K 4.4 07/27/2012 2235   CL 103 10/16/2012 1116   CL 97 07/27/2012 2235   CO2 28  10/16/2012 1116   CO2 25 07/27/2012 2235   BUN 16.0 10/16/2012 1116   BUN 27* 07/27/2012 2235   CREATININE 1.2* 10/16/2012 1116   CREATININE 1.35* 07/27/2012 2235      Component Value Date/Time   CALCIUM 9.3 10/16/2012 1116   CALCIUM 9.2 07/27/2012 2235   ALKPHOS 88 10/16/2012 1116   ALKPHOS 103 07/27/2012 2235   AST 17 10/16/2012 1116   AST 21 07/27/2012 2235   ALT 16 10/16/2012 1116   ALT 17 07/27/2012 2235   BILITOT 0.47 10/16/2012 1116   BILITOT 0.3 07/27/2012 2235       Studies/Results: [name] Extrem Low Left Ltd  09/28/2012  *RADIOLOGY REPORT*  Clinical Data: Left thigh mass  ULTRASOUND LEFT LOWER EXTREMITY COMPLETE  Technique:  Ultrasound examination of the thighwas performed including evaluation of the muscles, tendons, joint, and adjacent soft tissues.  Comparison:  None.  Findings: Ultrasound over the area of soft tissue prominence was performed.  No underlying solid or cystic abnormality is evident by ultrasound.  IMPRESSION: Negative.   Original Report Authenticated By: Dwyane Dee, M.D.     Medications: I have reviewed the patient's current medications.  Assessment/Plan:  1. Antiphospholipid antibody syndrome. 2. Arterial embolus to a digit on the left hand secondary to #1. 3. Chronic Coumadin anticoagulation due to #1 and #2. 4. Stage I ER positive cancer of the right breast 1999. 5. Chronic anxiety and depression. 6. History of reflux esophagitis with history of previous Nissen fundoplication. 7. History of esophageal stricture secondary  to #6. 8. Remote thyroid cancer status post thyroidectomy. 9. 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 10. History of atypical neurologic symptoms with previous extensive evaluation unremarkable. 11. Mild hyperkalemia (5.3) on labs today. She will discontinue the potassium supplement she recently started. We will obtain a repeat potassium level when she returns on 10/19/2012.  Disposition-she will continue Coumadin. She is scheduled to return for a visit  with the Coumadin clinic on 10/19/2012. She will return for a followup visit in 6 months.  Lonna Cobb ANP/GNP-BC    CC Dr. [facility]; Dr. Charna Elizabeth

## 2012-10-19 ENCOUNTER — Telehealth (INDEPENDENT_AMBULATORY_CARE_PROVIDER_SITE_OTHER): Payer: Self-pay | Admitting: General Surgery

## 2012-10-19 ENCOUNTER — Ambulatory Visit (HOSPITAL_BASED_OUTPATIENT_CLINIC_OR_DEPARTMENT_OTHER): Payer: Medicare Other | Admitting: Pharmacist

## 2012-10-19 ENCOUNTER — Other Ambulatory Visit (HOSPITAL_BASED_OUTPATIENT_CLINIC_OR_DEPARTMENT_OTHER): Payer: Medicare Other | Admitting: Lab

## 2012-10-19 ENCOUNTER — Telehealth: Payer: Self-pay | Admitting: Oncology

## 2012-10-19 DIAGNOSIS — R760 Raised antibody titer: Secondary | ICD-10-CM

## 2012-10-19 DIAGNOSIS — D6861 Antiphospholipid syndrome: Secondary | ICD-10-CM

## 2012-10-19 DIAGNOSIS — Z862 Personal history of diseases of the blood and blood-forming organs and certain disorders involving the immune mechanism: Secondary | ICD-10-CM

## 2012-10-19 DIAGNOSIS — D6859 Other primary thrombophilia: Secondary | ICD-10-CM

## 2012-10-19 DIAGNOSIS — I82409 Acute embolism and thrombosis of unspecified deep veins of unspecified lower extremity: Secondary | ICD-10-CM

## 2012-10-19 DIAGNOSIS — I742 Embolism and thrombosis of arteries of the upper extremities: Secondary | ICD-10-CM

## 2012-10-19 LAB — BASIC METABOLIC PANEL (CC13)
BUN: 20.9 mg/dL (ref 7.0–26.0)
CO2: 26 mEq/L (ref 22–29)
Calcium: 9.2 mg/dL (ref 8.4–10.4)
Chloride: 102 mEq/L (ref 98–107)
Creatinine: 1.3 mg/dL — ABNORMAL HIGH (ref 0.6–1.1)
Glucose: 160 mg/dl — ABNORMAL HIGH (ref 70–99)
Potassium: 4.4 mEq/L (ref 3.5–5.1)
Sodium: 137 mEq/L (ref 136–145)

## 2012-10-19 LAB — PROTIME-INR
INR: 2.3 (ref 2.00–3.50)
Protime: 27.6 Seconds — ABNORMAL HIGH (ref 10.6–13.4)

## 2012-10-19 LAB — POCT INR: INR: 2.3

## 2012-10-19 NOTE — Telephone Encounter (Signed)
S/w pt re appt for 9/23.

## 2012-10-19 NOTE — Telephone Encounter (Signed)
Pt called to ask if she needs to suspend taking her Coumadin for the endo procedure on 11/06/12 with Dr. Daphine Deutscher.  She also asked if the procedure could be done at a different time or on a different day, due to another appt she has.  Explained this was doubtful, due to booking the MD and the room, but would ask.  Please call her on with advice on her Coumadin:  [phone].

## 2012-10-19 NOTE — Progress Notes (Signed)
Pt continues therapeutic INR at 2.3 today. Continue 6 mg daily She has an endoscopy procedure scheduled for Monday, 4/14 and has phone call in to verify coumadin instructions. She anticipates a call back today or tomorrow and dialogue between them and Dr. Reece Agar. Scheduled her to return on day 5 post endoscopy, 4/18 at 1p.

## 2012-10-24 ENCOUNTER — Encounter: Payer: Self-pay | Admitting: Pharmacist

## 2012-10-24 NOTE — Progress Notes (Signed)
Per Dr. Cyndie Chime- Plan for anticoag bridging around endoscopic procedure scheduled for 11/06/12 at 9 am at [facility]: Take last dose of Coumadin on 11/02/12. No Coumadin 4/11, 4/12 & 4/13. Check INR Monday 11/06/12 at 8 am before 9 am endoscopy Restart Coumadin on evening of 11/06/12 unless otherwise instructed by Dr. Daphine Deutscher. Return 11/10/12 for lab & Coumadin clinic (already scheduled).  I have not given pt these instructions.  I called Dr. Ermalene Searing office to communicate the plan but Dr. Daphine Deutscher is out of the office.  He can be reached Monday 4/7.  Will need to call Dr. Ermalene Searing office (ph # [phone] - ask for triage RN & they can page Dr. Daphine Deutscher to call us & discuss plan).  Once the plan is ok'd by Dr. Daphine Deutscher, we will need to call Ms. Beecham & give her the instructions.  Pt is aware we will call her as soon as Dr. Daphine Deutscher has ok'd the plan above. Ebony Hail, Pharm.D., CPP 10/24/2012@1 :06 PM

## 2012-10-30 ENCOUNTER — Encounter: Payer: Self-pay | Admitting: Pharmacist

## 2012-10-30 NOTE — Progress Notes (Signed)
Follow up received from Dr. Daphine Deutscher today re: anticoag bridging plan as previously noted on 10/24/12.  Dr. Daphine Deutscher is ok w/ plan.  He is not "cutting on pt" so 3 days off Coumadin is fine. I left a voicemail on each of pts phones (cell & home) to call us in pharmacy to go over the plan in detail (provide stop/start dates) but she did not return my call today. Ebony Hail, Pharm.D., CPP 10/30/2012@4 :36 PM

## 2012-10-31 ENCOUNTER — Encounter: Payer: Self-pay | Admitting: Pharmacist

## 2012-10-31 ENCOUNTER — Other Ambulatory Visit: Payer: Self-pay | Admitting: Oncology

## 2012-10-31 NOTE — Progress Notes (Signed)
I have spoken w/ [name] over the phone this morning & given her instructions for bridging her anticoagulation around endoscopic procedure 11/06/12.  She understands plan & repeated the instructions back to me. She will come 11/06/12 for pre-op INR. Ebony Hail, Pharm.D., CPP 10/31/2012@9 :39 AM

## 2012-11-06 ENCOUNTER — Encounter (HOSPITAL_COMMUNITY)
Admission: RE | Disposition: A | Discharge status: Home / Self Care | Payer: Self-pay | Source: Ambulatory Visit | Attending: Surgery

## 2012-11-06 ENCOUNTER — Ambulatory Visit (HOSPITAL_BASED_OUTPATIENT_CLINIC_OR_DEPARTMENT_OTHER): Payer: Medicare Other | Admitting: Pharmacist

## 2012-11-06 ENCOUNTER — Other Ambulatory Visit (HOSPITAL_BASED_OUTPATIENT_CLINIC_OR_DEPARTMENT_OTHER): Payer: Medicare Other | Admitting: Lab

## 2012-11-06 ENCOUNTER — Encounter (HOSPITAL_COMMUNITY): Payer: Self-pay

## 2012-11-06 ENCOUNTER — Ambulatory Visit (HOSPITAL_COMMUNITY)
Admission: RE | Admit: 2012-11-06 | Discharge: 2012-11-06 | Disposition: A | Discharge status: Home / Self Care | Payer: Medicare Other | Source: Ambulatory Visit | Attending: Surgery | Admitting: Surgery

## 2012-11-06 DIAGNOSIS — D6859 Other primary thrombophilia: Secondary | ICD-10-CM

## 2012-11-06 DIAGNOSIS — I82409 Acute embolism and thrombosis of unspecified deep veins of unspecified lower extremity: Secondary | ICD-10-CM

## 2012-11-06 DIAGNOSIS — R1310 Dysphagia, unspecified: Secondary | ICD-10-CM | POA: Insufficient documentation

## 2012-11-06 DIAGNOSIS — R12 Heartburn: Secondary | ICD-10-CM | POA: Insufficient documentation

## 2012-11-06 DIAGNOSIS — K219 Gastro-esophageal reflux disease without esophagitis: Secondary | ICD-10-CM

## 2012-11-06 HISTORY — PX: ESOPHAGEAL MANOMETRY: SHX5429

## 2012-11-06 LAB — PROTIME-INR
INR: 1.2 — ABNORMAL LOW (ref 2.00–3.50)
Protime: 14.4 Seconds — ABNORMAL HIGH (ref 10.6–13.4)

## 2012-11-06 LAB — POCT INR: INR: 1.2

## 2012-11-06 SURGERY — MANOMETRY, ESOPHAGUS

## 2012-11-06 MED ORDER — LIDOCAINE VISCOUS 2 % MT SOLN
OROMUCOSAL | Status: AC
  Filled 2012-11-06: qty 15

## 2012-11-06 NOTE — Progress Notes (Signed)
Checked INR prior to procedure today. She was 1.2 Procedure went well per our telephone conversation.  She will resume 6mg  daily and we will recheck on 11/10/12

## 2012-11-07 ENCOUNTER — Encounter (HOSPITAL_COMMUNITY): Payer: Self-pay | Admitting: Surgery

## 2012-11-09 ENCOUNTER — Telehealth (INDEPENDENT_AMBULATORY_CARE_PROVIDER_SITE_OTHER): Payer: Self-pay

## 2012-11-09 NOTE — Telephone Encounter (Signed)
Pt calling for her test results. She was told she would get a call back in 2 days so she is calling b/c this is the 2nd day. Pls call pt.

## 2012-11-10 ENCOUNTER — Ambulatory Visit (HOSPITAL_BASED_OUTPATIENT_CLINIC_OR_DEPARTMENT_OTHER): Payer: Medicare Other | Admitting: Pharmacist

## 2012-11-10 ENCOUNTER — Other Ambulatory Visit (HOSPITAL_BASED_OUTPATIENT_CLINIC_OR_DEPARTMENT_OTHER): Payer: Medicare Other

## 2012-11-10 DIAGNOSIS — I82409 Acute embolism and thrombosis of unspecified deep veins of unspecified lower extremity: Secondary | ICD-10-CM

## 2012-11-10 DIAGNOSIS — D6859 Other primary thrombophilia: Secondary | ICD-10-CM

## 2012-11-10 DIAGNOSIS — D6861 Antiphospholipid syndrome: Secondary | ICD-10-CM

## 2012-11-10 DIAGNOSIS — R760 Raised antibody titer: Secondary | ICD-10-CM

## 2012-11-10 LAB — PROTIME-INR
INR: 1.4 — ABNORMAL LOW (ref 2.00–3.50)
Protime: 16.8 Seconds — ABNORMAL HIGH (ref 10.6–13.4)

## 2012-11-10 LAB — POCT INR: INR: 1.4

## 2012-11-10 NOTE — Patient Instructions (Addendum)
INR still below goal after restarting coumadin this week  Take 8 mg today and tomorrow then return to taking 6 mg daily   Return next week on Thursday 11/16/12 at 1:45pm for lab and 2:00pm for coumadin clinic

## 2012-11-10 NOTE — Progress Notes (Signed)
Pt started back on coumadin this week on Monday. INR is still below goal. Pt reports no missed doses, no leg pain or arm swelling, no bruising or bleeding. Instructed patient to take 8 mg for the next 2 days and then continue to take 6 mg daily and patient will return next week on Thursday 11/14/12 at 1:45pm for lab and 2pm for coumadin clinic

## 2012-11-13 ENCOUNTER — Telehealth: Payer: Self-pay | Admitting: Pharmacist

## 2012-11-13 ENCOUNTER — Encounter (INDEPENDENT_AMBULATORY_CARE_PROVIDER_SITE_OTHER): Payer: Self-pay | Admitting: *Deleted

## 2012-11-13 ENCOUNTER — Encounter: Payer: Self-pay | Admitting: Pharmacist

## 2012-11-13 ENCOUNTER — Telehealth (INDEPENDENT_AMBULATORY_CARE_PROVIDER_SITE_OTHER): Payer: Self-pay | Admitting: *Deleted

## 2012-11-13 NOTE — Telephone Encounter (Signed)
Results have not been received by this office at this time.  Patient is aware and states understanding that as soon as those results arrive she will be called with them.

## 2012-11-13 NOTE — Telephone Encounter (Signed)
Patient called again today asking for her results from her EM on 11/06/12.

## 2012-11-13 NOTE — Telephone Encounter (Signed)
[name] called stating that when she laid down she could taste blood then coughed up some and something that looked like a blood clot.  She has not noticed any blood on urine or stool or any other bleeding/bruising.  [name] is s/p endoscopic procedure (esophageal manometry).  [name] last seen in coumadin clinic on 11/10/12 and INR=1.4.  [name] instructed to take Coumadin 8mg  x 2 days then resume Coumadin 6mg  daily.  She is not on Lovenox.  I have scheduled [name] to come to Coumadin clinic tomorrow.  If she continues to have bleeding or coughing up blood, she is to go to ED.

## 2012-11-13 NOTE — Telephone Encounter (Signed)
Pt called back to report when she laid down on the couch, she "tasted blood."   Upon sitting up, she had bloody drainage from her nose.  Reassured the pt the blood was coming from her head (sinuses) and not sequelae from the EM a week ago.  Suggested NS gentle lavage and to call her PCP if bleeding worsens.

## 2012-11-14 ENCOUNTER — Ambulatory Visit: Payer: Medicare Other | Admitting: Pharmacist

## 2012-11-14 ENCOUNTER — Other Ambulatory Visit (HOSPITAL_BASED_OUTPATIENT_CLINIC_OR_DEPARTMENT_OTHER): Payer: Medicare Other | Admitting: Lab

## 2012-11-14 DIAGNOSIS — R760 Raised antibody titer: Secondary | ICD-10-CM

## 2012-11-14 DIAGNOSIS — D6859 Other primary thrombophilia: Secondary | ICD-10-CM

## 2012-11-14 DIAGNOSIS — D6861 Antiphospholipid syndrome: Secondary | ICD-10-CM

## 2012-11-14 DIAGNOSIS — I82409 Acute embolism and thrombosis of unspecified deep veins of unspecified lower extremity: Secondary | ICD-10-CM

## 2012-11-14 LAB — PROTIME-INR
INR: 1.7 — ABNORMAL LOW (ref 2.00–3.50)
Protime: 20.4 Seconds — ABNORMAL HIGH (ref 10.6–13.4)

## 2012-11-14 LAB — POCT INR: INR: 1.7

## 2012-11-14 NOTE — Progress Notes (Signed)
INR below goal today.  Pt restarted coumadin on 11/06/12 after esophageal manometry. Pt is not on Lovenox. She is here today to evaluate INR since she had a moderate nosebleed yesterday.  She could "taste" blood when she was laying down. She could feel something in her throat so she coughed up the blood patch/clot. Then she sat up and the blood started coming out her nose. No further nosebleeds since the incident yesterday. No other bleeding to report. No unusual bruising. No other problems. INR increasing toward goal. It usually takes her INR ~ 2 weeks to return to goal after stopping and resuming coumadin for procedures. Pt took increased coumadin doses (8mg ) on 4/18/ and 4/19, then continued 6mg  daily starting on 4/20. Will continue Coumadin 6 mg daily. Recheck INR on 11/20/12 at 11:15 am for lab and coumadin clinic at 11:30 am. Pt knows to call us if she has further nosebleeds. She knows to go to the ED if bleeding isn't controlled.

## 2012-11-14 NOTE — Patient Instructions (Signed)
Continue Coumadin 6 mg daily. Recheck INR on 11/20/12 at 11:15 am for lab and coumadin clinic at 11:30 am.

## 2012-11-16 ENCOUNTER — Ambulatory Visit: Payer: Medicare Other

## 2012-11-16 ENCOUNTER — Other Ambulatory Visit: Payer: Medicare Other | Admitting: Lab

## 2012-11-20 ENCOUNTER — Other Ambulatory Visit (HOSPITAL_BASED_OUTPATIENT_CLINIC_OR_DEPARTMENT_OTHER): Payer: Medicare Other | Admitting: Lab

## 2012-11-20 ENCOUNTER — Ambulatory Visit (HOSPITAL_BASED_OUTPATIENT_CLINIC_OR_DEPARTMENT_OTHER): Payer: Medicare Other | Admitting: Pharmacist

## 2012-11-20 DIAGNOSIS — D6861 Antiphospholipid syndrome: Secondary | ICD-10-CM

## 2012-11-20 DIAGNOSIS — R760 Raised antibody titer: Secondary | ICD-10-CM

## 2012-11-20 DIAGNOSIS — D6859 Other primary thrombophilia: Secondary | ICD-10-CM

## 2012-11-20 DIAGNOSIS — I82409 Acute embolism and thrombosis of unspecified deep veins of unspecified lower extremity: Secondary | ICD-10-CM

## 2012-11-20 LAB — PROTIME-INR
INR: 2.5 (ref 2.00–3.50)
Protime: 30 Seconds — ABNORMAL HIGH (ref 10.6–13.4)

## 2012-11-20 LAB — POCT INR: INR: 2.5

## 2012-11-20 NOTE — Progress Notes (Signed)
INR back at goal on Coumadin 6mg  daily after esophageal procedure.  [name] is very stable on this coumadin dose.  Will continue 6mg  daily and check PT/INR in 1 month.  [name] has appt for results on esophageal procedure on 11/23/12 and will let us know if any further surgery is needed.

## 2012-11-23 ENCOUNTER — Encounter (INDEPENDENT_AMBULATORY_CARE_PROVIDER_SITE_OTHER): Payer: Self-pay | Admitting: Surgery

## 2012-11-23 ENCOUNTER — Ambulatory Visit (INDEPENDENT_AMBULATORY_CARE_PROVIDER_SITE_OTHER): Payer: Medicare Other | Admitting: Surgery

## 2012-11-23 VITALS — BP 126/84 | HR 80 | Temp 97.1°F | Resp 16 | Ht 64.0 in | Wt 176.0 lb

## 2012-11-23 DIAGNOSIS — K219 Gastro-esophageal reflux disease without esophagitis: Secondary | ICD-10-CM

## 2012-11-23 NOTE — Patient Instructions (Addendum)
Hold coumadin preop per Dr. Moises Blood Fundoplication Care After Please read the instructions outlined below and refer to this sheet for the next few weeks. These discharge instructions provide you with general information on caring for yourself after you leave the hospital. Your doctor may also give you specific instructions. While your treatment has been planned according to the most current medical practices available, unavoidable complications sometimes happen. If you have any problems or questions after discharge, please call your doctor. ACTIVITY  Take frequent rest periods throughout the day.  Take frequent walks throughout the day. This will help to prevent blood clots.  Continue to do your coughing and deep breathing exercises once you get home. This will help to prevent pneumonia.  No strenuous activities such as heavy lifting, pushing or pulling until after your follow-up visit with your doctor. Do not lift anything heavier than 10 pounds.  Talk with your caregiver about when you may return to work and your exercise routine.  You may shower 2 days after surgery. Pat incisions dry. Do not rub incisions with washcloth or towel.  Do not drive while taking prescription pain medication. NUTRITION  Continue with a liquid diet, or the diet you were directed to take, until your first follow-up visit with your surgeon.  Drink fluids (6-8 glasses a day).  Call your caregiver for persistent nausea (feeling sick to your stomach), vomiting, bloating or difficulty swallowing. ELIMINATION It is very important not to strain during bowel movements. If constipation should occur, you may:  Take a mild laxative (such as Milk of Magnesia).  Add fruit and bran to your diet.  Drink more fluids.  Call your caregiver if constipation is not relieved. FEVER If you feel feverish or have shaking chills, take your temperature. If it is 102 F (38.9 C) or above, call your caregiver. The  fever may mean there is an infection. PAIN CONTROL  If a prescription was given for a pain reliever, please follow your caregiver's directions.  Only take over-the-counter or prescription medicines for pain, discomfort, or fever as directed by your caregiver.  If the pain is not relieved by your medicine, becomes worse, or you have difficulty breathing, call your doctor. INCISION  It is normal for your cuts (incisions) from surgery to have a small amount of drainage for the first 1-2 days. Once the drainage has stopped, leave your incision(s) open to air.  Check your incision(s) and surrounding area daily for any redness, swelling, increased drainage or bleeding. If any of these are present or if the wound edges start to separate, call your doctor.  If you have small adhesive strips in place, they will peel and fall off. (If these strips are covered with a clear bandage, your doctor will tell you when to remove them.)  If you have staples, your caregiver will remove them at the follow-up appointment. Document Released: 03/04/2004 Document Revised: 10/04/2011 Document Reviewed: 06/08/2007 [facility] Patient Information 2013 [place], [place].

## 2012-11-23 NOTE — Progress Notes (Signed)
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 Lindsay in 1998  History of Present Illness:  [name] is an 66 y.o. female who has underlying antiphospholipid syndrome and on chronic anticoagulation is having severe hoarseness.  Upper GI shows her wrap is unwrapped. She describes a very complicated perioperative history that is not indicated in Dr. Lindsey's discharge summary.  She did have some problem with a pleural effusion and possibly hemorrhage. I explained the risk of try to do this laparoscopically and possible converting to open. She is aware of this. I did get an esophageal manometry which was normal when read by Dan at [facility]. This is from the computer generated report since Dr. schooler has not formally read the study that was done 2 weeks ago.  We'll go and set up for laparoscopic redo Nissen fundoplication [place].  Past Medical History  Diagnosis Date  . Chest pain   . Hx-TIA (transient ischemic attack)   . Hypothyroidism   . Breast cancer   . History of DVT (deep vein thrombosis)   . History of antiphospholipid antibody syndrome   . 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 Arthritis   . Hyperlipidemia   . Neuromuscular disorder     Past Surgical History  Procedure Laterality Date  . Cardiac catheterization  07/12/1997    NORMAL LEFT VENTRICULAR FUNCTION WITH EF AT LEAST 70-75%  . Thyroidectomy    . Left oophorectomy  1982  . Right oophorectomy  1980  . Abdominal hysterectomy    . Esophageal manometry N/A 11/06/2012    Procedure: ESOPHAGEAL MANOMETRY (EM);  Surgeon: Tawonda Legaspi B Joclyn Alsobrook, MD;  Location: [facility];  Service: General;  Laterality: N/A;    Current Outpatient Prescriptions  Medication Sig Dispense Refill  . ALPRAZolam (XANAX) 1 MG tablet Take 0.5 mg by mouth 3 (three) times daily.       . aspirin 81 MG tablet Take  81 mg by mouth daily.        . Calcium Carbonate-Vitamin D (CALCIUM + D PO) Take 600 mg by mouth. Taking 1 Tablet Daily Tablet contains Magnesium and Zinc.      . HYDROcodone-acetaminophen (NORCO/VICODIN) 5-325 MG per tablet Take 1 tablet by mouth every 6 (six) hours as needed. pain      . levothyroxine (SYNTHROID, LEVOTHROID) 100 MCG tablet Take 100 mcg by mouth daily.      . metoprolol succinate (TOPROL-XL) 25 MG 24 hr tablet Take 25 mg by mouth daily.        . omeprazole (PRILOSEC) 20 MG capsule Take 20 mg by mouth 2 (two) times daily.        . pravastatin (PRAVACHOL) 40 MG tablet Take 40 mg by mouth at bedtime.       . tiZANidine (ZANAFLEX) 4 MG capsule Take 4 mg by mouth every 6 (six) hours as needed. Muscle pain      . warfarin (COUMADIN) 4 MG tablet Take 6 mg by mouth daily. Patient takes 1.5 tablets daily      . warfarin (COUMADIN) 4 MG tablet TAKE 2 TABS DAILY EXCEPT 1 AND 1/2 TABS ON SATURDAY AND SUNDAY  168 tablet  2  . zolpidem (AMBIEN) 10 MG tablet Take 5-10 mg by mouth at bedtime.       . buPROPion (WELLBUTRIN SR) 200 MG 12 hr tablet Take 200 mg by mouth 2 (  two) times daily.        . [DISCONTINUED] fluticasone (FLONASE) 50 MCG/ACT nasal spray Place 2 sprays into the nose daily.         No current facility-administered medications for this visit.   Benadryl; Crestor; Daypro; Hornet venom; Levofloxacin; Levofloxacin; Sulfonamide derivatives; Vitamin k; and Zocor Family History  Problem Relation Age of Onset  . Cancer Maternal Grandmother     colon   Social History:   reports that she has never smoked. She has never used smokeless tobacco. She reports that she does not drink alcohol or use illicit drugs.   REVIEW OF SYSTEMS - PERTINENT POSITIVES ONLY: Positive for anticoagulation  Physical Exam:   Blood pressure 126/84, pulse 80, temperature 97.1 F (36.2 C), temperature source Oral, resp. rate 16, height 5' 4" (1.626 m), weight 176 lb (79.833 kg). Body mass index is 30.2  kg/(m^2).  Gen:  WDWN WF NAD  Neurological: Alert and oriented to person, place, and time. Motor and sensory function is grossly intact  Head: Normocephalic and atraumatic.  Eyes: Conjunctivae are normal. Pupils are equal, round, and reactive to light. No scleral icterus.  Neck: Normal range of motion. Neck supple. No tracheal deviation or thyromegaly present.  Cardiovascular:  SR without murmurs or gallops.  No carotid bruits Respiratory: Effort normal.  No respiratory distress. No chest wall tenderness. Breath sounds normal.  No wheezes, rales or rhonchi.  Abdomen:  Long midline incision GU: Musculoskeletal: Normal range of motion. Extremities are nontender. No cyanosis, edema or clubbing noted Lymphadenopathy: No cervical, preauricular, postauricular or axillary adenopathy is present Skin: Skin is warm and dry. No rash noted. No diaphoresis. No erythema. No pallor. Pscyh: Normal mood and affect. Behavior is normal. Judgment and thought content normal.   LABORATORY RESULTS: No results found for this or any previous visit (from the past 48 hour(s)).  RADIOLOGY RESULTS: No results found.  Problem List: Patient Active Problem List   Diagnosis Date Noted  . Dysphagia, unspecified 09/28/2012  . S/P Open Nissen 1998 Dr. Lindsey 08/18/2012  . 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 Lupus anticoagulant positive 06/01/2011  . Chest pain   . Hx-TIA (transient ischemic attack)   . Hypothyroidism   . Breast cancer   . History of DVT (deep vein thrombosis)   . 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  Will proceed with laparoscopic redo Nissen and repair of hiatus hernia    Matt B. Makhya Arave, MD, FACS  [facility] [phone] beeper [phone]  11/23/2012 9:44 AM     

## 2012-11-28 ENCOUNTER — Encounter (HOSPITAL_COMMUNITY): Payer: Self-pay | Admitting: Pharmacy Technician

## 2012-11-29 NOTE — Progress Notes (Signed)
We need orders put in EPIC please - pt coming for preop 12/06/12 - thank you

## 2012-11-30 ENCOUNTER — Encounter: Payer: Self-pay | Admitting: Pharmacist

## 2012-11-30 ENCOUNTER — Other Ambulatory Visit (INDEPENDENT_AMBULATORY_CARE_PROVIDER_SITE_OTHER): Payer: Self-pay | Admitting: Surgery

## 2012-11-30 ENCOUNTER — Telehealth (INDEPENDENT_AMBULATORY_CARE_PROVIDER_SITE_OTHER): Payer: Self-pay

## 2012-11-30 NOTE — Progress Notes (Signed)
[name] called and was diagnosed with a spider bite on foot and started on doxycycline.  I have scheduled a coumadin clinic appt in 1 weeks to evaluate for possible drug interaction with coumadin.

## 2012-11-30 NOTE — Telephone Encounter (Signed)
Pt wanted to let our office know due to infection on foot she was placed on doxycyline for 10 days starting yesterday. She states she will be finished with medication prior to surgery date. Pt advised send msg to advise Dr Daphine Deutscher of this. Pt advised to call if foot does not continue to improve or if medication is continued beyond date of surgery.

## 2012-12-01 ENCOUNTER — Encounter (INDEPENDENT_AMBULATORY_CARE_PROVIDER_SITE_OTHER): Payer: Self-pay

## 2012-12-06 ENCOUNTER — Other Ambulatory Visit: Payer: Self-pay | Admitting: Oncology

## 2012-12-06 ENCOUNTER — Telehealth: Payer: Self-pay | Admitting: Pharmacist

## 2012-12-06 ENCOUNTER — Other Ambulatory Visit (HOSPITAL_BASED_OUTPATIENT_CLINIC_OR_DEPARTMENT_OTHER): Payer: Medicare Other | Admitting: Lab

## 2012-12-06 ENCOUNTER — Ambulatory Visit: Payer: Medicare Other | Admitting: Pharmacist

## 2012-12-06 ENCOUNTER — Encounter (HOSPITAL_COMMUNITY)
Admission: RE | Admit: 2012-12-06 | Discharge: 2012-12-06 | Disposition: A | Discharge status: Home / Self Care | Payer: Medicare Other | Source: Ambulatory Visit | Attending: Surgery | Admitting: Surgery

## 2012-12-06 ENCOUNTER — Encounter (HOSPITAL_COMMUNITY): Payer: Self-pay

## 2012-12-06 DIAGNOSIS — I82409 Acute embolism and thrombosis of unspecified deep veins of unspecified lower extremity: Secondary | ICD-10-CM

## 2012-12-06 DIAGNOSIS — D6861 Antiphospholipid syndrome: Secondary | ICD-10-CM

## 2012-12-06 DIAGNOSIS — Z01812 Encounter for preprocedural laboratory examination: Secondary | ICD-10-CM | POA: Insufficient documentation

## 2012-12-06 DIAGNOSIS — Z86718 Personal history of other venous thrombosis and embolism: Secondary | ICD-10-CM

## 2012-12-06 DIAGNOSIS — D6859 Other primary thrombophilia: Secondary | ICD-10-CM

## 2012-12-06 DIAGNOSIS — Z8673 Personal history of transient ischemic attack (TIA), and cerebral infarction without residual deficits: Secondary | ICD-10-CM | POA: Insufficient documentation

## 2012-12-06 DIAGNOSIS — R760 Raised antibody titer: Secondary | ICD-10-CM

## 2012-12-06 DIAGNOSIS — K449 Diaphragmatic hernia without obstruction or gangrene: Secondary | ICD-10-CM | POA: Insufficient documentation

## 2012-12-06 DIAGNOSIS — I10 Essential (primary) hypertension: Secondary | ICD-10-CM | POA: Insufficient documentation

## 2012-12-06 DIAGNOSIS — K219 Gastro-esophageal reflux disease without esophagitis: Secondary | ICD-10-CM | POA: Insufficient documentation

## 2012-12-06 DIAGNOSIS — IMO0001 Reserved for inherently not codable concepts without codable children: Secondary | ICD-10-CM | POA: Insufficient documentation

## 2012-12-06 HISTORY — DX: Personal history of other diseases of the digestive system: Z87.19

## 2012-12-06 HISTORY — DX: Cerebral infarction, unspecified: I63.9

## 2012-12-06 HISTORY — DX: Unspecified asthma, uncomplicated: J45.909

## 2012-12-06 HISTORY — DX: Major depressive disorder, single episode, unspecified: F32.9

## 2012-12-06 HISTORY — DX: Headache: R51

## 2012-12-06 HISTORY — DX: Inflammatory liver disease, unspecified: K75.9

## 2012-12-06 HISTORY — DX: Essential (primary) hypertension: I10

## 2012-12-06 HISTORY — DX: Gastro-esophageal reflux disease without esophagitis: K21.9

## 2012-12-06 HISTORY — DX: Other complications of anesthesia, initial encounter: T88.59XA

## 2012-12-06 HISTORY — DX: Fibromyalgia: M79.7

## 2012-12-06 HISTORY — DX: Depression, unspecified: F32.A

## 2012-12-06 HISTORY — DX: Other pulmonary collapse: J98.19

## 2012-12-06 HISTORY — DX: Anxiety disorder, unspecified: F41.9

## 2012-12-06 HISTORY — DX: Pneumonia, unspecified organism: J18.9

## 2012-12-06 HISTORY — DX: Adverse effect of unspecified anesthetic, initial encounter: T41.45XA

## 2012-12-06 LAB — PROTIME-INR
INR: 2.4 (ref 2.00–3.50)
Protime: 28.8 Seconds — ABNORMAL HIGH (ref 10.6–13.4)

## 2012-12-06 LAB — BASIC METABOLIC PANEL
BUN: 18 mg/dL (ref 6–23)
CO2: 29 mEq/L (ref 19–32)
Calcium: 9.8 mg/dL (ref 8.4–10.5)
Chloride: 99 mEq/L (ref 96–112)
Creatinine, Ser: 1.15 mg/dL — ABNORMAL HIGH (ref 0.50–1.10)
GFR calc Af Amer: 57 mL/min — ABNORMAL LOW (ref >90)
GFR calc non Af Amer: 49 mL/min — ABNORMAL LOW (ref >90)
Glucose, Bld: 97 mg/dL (ref 70–99)
Potassium: 4.6 mEq/L (ref 3.5–5.1)
Sodium: 137 mEq/L (ref 135–145)

## 2012-12-06 LAB — CBC
HCT: 38.9 % (ref 36.0–46.0)
Hemoglobin: 13.2 g/dL (ref 12.0–15.0)
MCH: 31.1 pg (ref 26.0–34.0)
MCHC: 33.9 g/dL (ref 30.0–36.0)
MCV: 91.7 fL (ref 78.0–100.0)
Platelets: 283 10*3/uL (ref 150–400)
RBC: 4.24 MIL/uL (ref 3.87–5.11)
RDW: 13 % (ref 11.5–15.5)
WBC: 7.3 10*3/uL (ref 4.0–10.5)

## 2012-12-06 LAB — SURGICAL PCR SCREEN
MRSA, PCR: NEGATIVE
Staphylococcus aureus: NEGATIVE

## 2012-12-06 LAB — APTT: aPTT: 41 seconds — ABNORMAL HIGH (ref 24–37)

## 2012-12-06 LAB — POCT INR: INR: 2.4

## 2012-12-06 NOTE — Progress Notes (Signed)
EKG 02/03/12 on EPIC, Chest x-ray 02/03/12 on EPIC

## 2012-12-06 NOTE — Telephone Encounter (Signed)
Left message with patient confirming anticoagulation plan around upcoming surgery on 12/12/12. Plan discussed with Dr. Cyndie Chime. Take last dose of coumadin by mouth on 5/16. Hold coumadin on 5/17, 5/18 and 5/19 (Hold coumadin x 3 days prior to surgery). Check INR prior to surgery at [facility] on 12/12/12; lab at 8:15am and coumadin clinic at 8:30am. Pt will likely be in the hospital for ~ 3 days after the surgery. Resume coumadin on 5/20 - the evening of the surgery. Pt is stable on 6mg  daily. Give Lovenox 1mg /kg x 1 -  24 hours post surgery. Begin Lovenox 1.5mg /kg daily starting 48 hours post surgery. Continue coumadin and Lovenox daily until INR >/= 2. Unknown length of hospital stay post surgery. Pt will call us when she is discharged for f/u INR check. Dr. Cyndie Chime will communicate this information to Dr. Daphine Deutscher. Above plan given to [facility] pharmacists.

## 2012-12-06 NOTE — Anesthesia Preprocedure Evaluation (Addendum)
Anesthesia Evaluation  Patient identified by MRN, date of birth, ID band Patient awake    Reviewed: Allergy & Precautions, H&P , NPO status , Patient's Chart, lab work & pertinent test results, reviewed documented beta blocker date and time   Airway Mallampati: II TM Distance: >3 FB Neck ROM: full    Dental no notable dental hx. (+) Teeth Intact and Dental Advisory Given Temporary left upper side.  Bonding on front upper teeth:   Pulmonary asthma , pneumonia -, resolved,  breath sounds clear to auscultation  Pulmonary exam normal       Cardiovascular Exercise Tolerance: Good hypertension, Pt. on home beta blockers + Peripheral Vascular Disease Rhythm:regular Rate:Normal  ECG and CXR reviewed.   Neuro/Psych  Headaches, PSYCHIATRIC DISORDERS Anxiety Depression TIA Neuromuscular disease CVA, No Residual Symptoms    GI/Hepatic hiatal hernia, GERD-  Medicated and Poorly Controlled,(+) Hepatitis -  Endo/Other  negative endocrine ROSHypothyroidism   Renal/GU negative Renal ROS  negative genitourinary   Musculoskeletal  (+) Fibromyalgia -  Abdominal   Peds  Hematology History antiphospholipid antibody with increased clotting. DVTs and clot in heart.  On Coumadin.  Lupus anticoagulant.  Clot in arm too.     Anesthesia Other Findings   Reproductive/Obstetrics negative OB ROS                         Anesthesia Physical Anesthesia Plan  ASA: III  Anesthesia Plan: General   Post-op Pain Management:    Induction: Intravenous  Airway Management Planned: Oral ETT  Additional Equipment:   Intra-op Plan:   Post-operative Plan: Extubation in OR  Informed Consent: I have reviewed the patients History and Physical, chart, labs and discussed the procedure including the risks, benefits and alternatives for the proposed anesthesia with the patient or authorized representative who has indicated his/her  understanding and acceptance.   Dental Advisory Given  Plan Discussed with: CRNA and Surgeon  Anesthesia Plan Comments: (Increased risk for clotting. Heparin has been given.)      Anesthesia Quick Evaluation

## 2012-12-06 NOTE — Patient Instructions (Signed)
21 [name]  12/06/2012   Your procedure is scheduled on: 12/12/12  Report to [facility] at 0915 AM.  Call this number if you have problems the morning of surgery 336-: [phone]   Remember: fleets enema night before surgery    Do not eat food or drink liquids After Midnight.     Take these medicines the morning of surgery with A SIP OF WATER: wellbutrin, synthroid, metoprolol succinate, prilosec, xanax   Do not wear jewelry, make-up or nail polish.  Do not wear lotions, powders, or perfumes. You may wear deodorant.  Do not shave 48 hours prior to surgery. Men may shave face and neck.  Do not bring valuables to the hospital.  Contacts, dentures or bridgework may not be worn into surgery.  Leave suitcase in the car. After surgery it may be brought to your room.  For patients admitted to the hospital, checkout time is 11:00 AM the day of discharge.    Please read over the following fact sheets that you were given: MRSA Information.  [name], RN  pre op nurse call if needed [phone]    FAILURE TO FOLLOW THESE INSTRUCTIONS MAY RESULT IN CANCELLATION OF YOUR SURGERY   Patient Signature: ___________________________________________

## 2012-12-07 NOTE — Patient Instructions (Addendum)
Continue Coumadin 6 mg daily. Take last dose of coumadin by mouth on 5/16.  Hold coumadin on 5/17, 5/18 and 5/19 (Hold coumadin x 3 days prior to surgery).  Check INR prior to surgery at [facility] on 12/12/12; lab at 8:15am and coumadin clinic at 8:30am.  Resume coumadin on 5/20 - the evening of the surgery, Resume 6mg  daily.  Give Lovenox 1mg /kg x 1 - 24 hours post surgery.  Begin Lovenox 1.5mg /kg daily starting 48 hours post surgery.  Continue coumadin and Lovenox daily until INR >/= 2.  Unknown length of hospital stay post surgery.   Call us when you are discharged to schedule f/u INR check.

## 2012-12-07 NOTE — Progress Notes (Signed)
INR within goal today. Pt is having surgery on 12/12/12 - laprascopic nissen and hiatus hernia repair.  Plan discussed with Dr. Cyndie Chime.  Take last dose of coumadin by mouth on 5/16.  Hold coumadin on 5/17, 5/18 and 5/19 (Hold coumadin x 3 days prior to surgery).  Check INR prior to surgery at [facility] on 12/12/12; lab at 8:15am and coumadin clinic at 8:30am.  Pt will likely be in the hospital for ~ 3 days after the surgery.  Resume coumadin on 5/20 - the evening of the surgery. Pt is stable on 6mg  daily.  Give Lovenox 1mg /kg x 1 - 24 hours post surgery.  Begin Lovenox 1.5mg /kg daily starting 48 hours post surgery.  Continue coumadin and Lovenox daily until INR >/= 2.  Unknown length of hospital stay post surgery.  Pt will call us when she is discharged for f/u INR check.  Dr. Cyndie Chime will communicate this information to Dr. Daphine Deutscher.  Above plan given to [facility] pharmacists.  Pt will need Lovenox samples when she comes for her appt on 5/20

## 2012-12-12 ENCOUNTER — Ambulatory Visit (HOSPITAL_BASED_OUTPATIENT_CLINIC_OR_DEPARTMENT_OTHER): Payer: Medicare Other | Admitting: Pharmacist

## 2012-12-12 ENCOUNTER — Inpatient Hospital Stay (HOSPITAL_COMMUNITY)
Admission: RE | Admit: 2012-12-12 | Discharge: 2012-12-15 | DRG: 327 | Disposition: A | Discharge status: Home / Self Care | Payer: Medicare Other | Source: Ambulatory Visit | Attending: Surgery | Admitting: Surgery

## 2012-12-12 ENCOUNTER — Encounter (HOSPITAL_COMMUNITY): Payer: Self-pay | Admitting: Anesthesiology

## 2012-12-12 ENCOUNTER — Ambulatory Visit (HOSPITAL_COMMUNITY): Payer: Medicare Other | Admitting: Anesthesiology

## 2012-12-12 ENCOUNTER — Encounter (HOSPITAL_COMMUNITY): Payer: Self-pay | Admitting: Certified Registered Nurse Anesthetist

## 2012-12-12 ENCOUNTER — Other Ambulatory Visit (HOSPITAL_BASED_OUTPATIENT_CLINIC_OR_DEPARTMENT_OTHER): Payer: Medicare Other | Admitting: Lab

## 2012-12-12 ENCOUNTER — Encounter (HOSPITAL_COMMUNITY)
Admission: RE | Disposition: A | Discharge status: Home / Self Care | Payer: Self-pay | Source: Ambulatory Visit | Attending: Surgery

## 2012-12-12 DIAGNOSIS — K449 Diaphragmatic hernia without obstruction or gangrene: Principal | ICD-10-CM | POA: Diagnosis present

## 2012-12-12 DIAGNOSIS — E039 Hypothyroidism, unspecified: Secondary | ICD-10-CM | POA: Diagnosis present

## 2012-12-12 DIAGNOSIS — Z853 Personal history of malignant neoplasm of breast: Secondary | ICD-10-CM

## 2012-12-12 DIAGNOSIS — Z01812 Encounter for preprocedural laboratory examination: Secondary | ICD-10-CM

## 2012-12-12 DIAGNOSIS — Z7901 Long term (current) use of anticoagulants: Secondary | ICD-10-CM

## 2012-12-12 DIAGNOSIS — I742 Embolism and thrombosis of arteries of the upper extremities: Secondary | ICD-10-CM

## 2012-12-12 DIAGNOSIS — R894 Abnormal immunological findings in specimens from other organs, systems and tissues: Secondary | ICD-10-CM

## 2012-12-12 DIAGNOSIS — D6859 Other primary thrombophilia: Secondary | ICD-10-CM | POA: Diagnosis present

## 2012-12-12 DIAGNOSIS — Z86718 Personal history of other venous thrombosis and embolism: Secondary | ICD-10-CM

## 2012-12-12 DIAGNOSIS — K219 Gastro-esophageal reflux disease without esophagitis: Secondary | ICD-10-CM | POA: Diagnosis present

## 2012-12-12 DIAGNOSIS — I82409 Acute embolism and thrombosis of unspecified deep veins of unspecified lower extremity: Secondary | ICD-10-CM

## 2012-12-12 DIAGNOSIS — Z8673 Personal history of transient ischemic attack (TIA), and cerebral infarction without residual deficits: Secondary | ICD-10-CM

## 2012-12-12 DIAGNOSIS — R760 Raised antibody titer: Secondary | ICD-10-CM

## 2012-12-12 DIAGNOSIS — Z862 Personal history of diseases of the blood and blood-forming organs and certain disorders involving the immune mechanism: Secondary | ICD-10-CM

## 2012-12-12 DIAGNOSIS — K660 Peritoneal adhesions (postprocedural) (postinfection): Secondary | ICD-10-CM | POA: Diagnosis present

## 2012-12-12 DIAGNOSIS — R1310 Dysphagia, unspecified: Secondary | ICD-10-CM | POA: Diagnosis present

## 2012-12-12 DIAGNOSIS — D131 Benign neoplasm of stomach: Secondary | ICD-10-CM | POA: Diagnosis present

## 2012-12-12 DIAGNOSIS — Q398 Other congenital malformations of esophagus: Secondary | ICD-10-CM

## 2012-12-12 DIAGNOSIS — D6861 Antiphospholipid syndrome: Secondary | ICD-10-CM

## 2012-12-12 HISTORY — PX: LAPAROSCOPIC NISSEN FUNDOPLICATION: SHX1932

## 2012-12-12 HISTORY — PX: UPPER GI ENDOSCOPY: SHX6162

## 2012-12-12 LAB — CREATININE, SERUM
Creatinine, Ser: 0.95 mg/dL (ref 0.50–1.10)
GFR calc Af Amer: 71 mL/min — ABNORMAL LOW (ref >90)
GFR calc non Af Amer: 62 mL/min — ABNORMAL LOW (ref >90)

## 2012-12-12 LAB — CBC
HCT: 32.2 % — ABNORMAL LOW (ref 36.0–46.0)
Hemoglobin: 11.1 g/dL — ABNORMAL LOW (ref 12.0–15.0)
MCH: 31.8 pg (ref 26.0–34.0)
MCHC: 34.5 g/dL (ref 30.0–36.0)
MCV: 92.3 fL (ref 78.0–100.0)
Platelets: 168 10*3/uL (ref 150–400)
RBC: 3.49 MIL/uL — ABNORMAL LOW (ref 3.87–5.11)
RDW: 13.2 % (ref 11.5–15.5)
WBC: 10 10*3/uL (ref 4.0–10.5)

## 2012-12-12 LAB — POCT INR: INR: 1.2

## 2012-12-12 LAB — PROTIME-INR
INR: 1.2 — ABNORMAL LOW (ref 2.00–3.50)
Protime: 14.4 Seconds — ABNORMAL HIGH (ref 10.6–13.4)

## 2012-12-12 SURGERY — FUNDOPLICATION, NISSEN, LAPAROSCOPIC
Anesthesia: General | Site: Esophagus | Wound class: Clean Contaminated

## 2012-12-12 MED ORDER — CEFOXITIN SODIUM 2 G IV SOLR
2.0000 g | INTRAVENOUS | Status: AC
  Administered 2012-12-12: 2 g via INTRAVENOUS

## 2012-12-12 MED ORDER — HYDROMORPHONE HCL PF 1 MG/ML IJ SOLN
INTRAMUSCULAR | Status: AC
  Filled 2012-12-12: qty 1

## 2012-12-12 MED ORDER — SUCCINYLCHOLINE CHLORIDE 20 MG/ML IJ SOLN
INTRAMUSCULAR | Status: DC | PRN
  Administered 2012-12-12: 100 mg via INTRAVENOUS

## 2012-12-12 MED ORDER — HEPARIN SODIUM (PORCINE) 5000 UNIT/ML IJ SOLN
5000.0000 [IU] | Freq: Three times a day (TID) | INTRAMUSCULAR | Status: DC
  Administered 2012-12-13 – 2012-12-14 (×4): 5000 [IU] via SUBCUTANEOUS
  Filled 2012-12-12 (×8): qty 1

## 2012-12-12 MED ORDER — UNJURY VANILLA POWDER
2.0000 [oz_av] | Freq: Four times a day (QID) | ORAL | Status: DC
  Administered 2012-12-14: 2 [oz_av] via ORAL

## 2012-12-12 MED ORDER — BUPIVACAINE LIPOSOME 1.3 % IJ SUSP
INTRAMUSCULAR | Status: DC | PRN
  Administered 2012-12-12: 20 mL

## 2012-12-12 MED ORDER — NEOSTIGMINE METHYLSULFATE 1 MG/ML IJ SOLN
INTRAMUSCULAR | Status: DC | PRN
  Administered 2012-12-12: 4 mg via INTRAVENOUS

## 2012-12-12 MED ORDER — ONDANSETRON HCL 4 MG/2ML IJ SOLN
INTRAMUSCULAR | Status: DC | PRN
  Administered 2012-12-12: 4 mg via INTRAVENOUS

## 2012-12-12 MED ORDER — LACTATED RINGERS IV SOLN: INTRAVENOUS | Status: DC

## 2012-12-12 MED ORDER — UNJURY CHICKEN SOUP POWDER: 2.0000 [oz_av] | Freq: Four times a day (QID) | ORAL | Status: DC

## 2012-12-12 MED ORDER — ACETAMINOPHEN 10 MG/ML IV SOLN
INTRAVENOUS | Status: AC
  Filled 2012-12-12: qty 100

## 2012-12-12 MED ORDER — PROPOFOL 10 MG/ML IV BOLUS
INTRAVENOUS | Status: DC | PRN
  Administered 2012-12-12: 180 mg via INTRAVENOUS

## 2012-12-12 MED ORDER — ONDANSETRON HCL 4 MG/2ML IJ SOLN
4.0000 mg | INTRAMUSCULAR | Status: DC | PRN
  Administered 2012-12-12: 4 mg via INTRAVENOUS
  Filled 2012-12-12: qty 2

## 2012-12-12 MED ORDER — EPHEDRINE SULFATE 50 MG/ML IJ SOLN
INTRAMUSCULAR | Status: DC | PRN
  Administered 2012-12-12 (×2): 10 mg via INTRAVENOUS
  Administered 2012-12-12: 5 mg via INTRAVENOUS
  Administered 2012-12-12 (×7): 10 mg via INTRAVENOUS

## 2012-12-12 MED ORDER — HEPARIN SODIUM (PORCINE) 5000 UNIT/ML IJ SOLN
5000.0000 [IU] | Freq: Once | INTRAMUSCULAR | Status: AC
  Administered 2012-12-12: 5000 [IU] via SUBCUTANEOUS

## 2012-12-12 MED ORDER — ACETAMINOPHEN 160 MG/5ML PO SOLN: 650.0000 mg | ORAL | Status: DC | PRN

## 2012-12-12 MED ORDER — KETAMINE HCL 10 MG/ML IJ SOLN
INTRAMUSCULAR | Status: DC | PRN
  Administered 2012-12-12 (×2): 10 mg via INTRAVENOUS

## 2012-12-12 MED ORDER — LACTATED RINGERS IR SOLN
Status: DC | PRN
  Administered 2012-12-12: 1000 mL

## 2012-12-12 MED ORDER — BUPIVACAINE LIPOSOME 1.3 % IJ SUSP
20.0000 mL | Freq: Once | INTRAMUSCULAR | Status: DC
  Filled 2012-12-12: qty 20

## 2012-12-12 MED ORDER — 0.9 % SODIUM CHLORIDE (POUR BTL) OPTIME
TOPICAL | Status: DC | PRN
  Administered 2012-12-12: 1000 mL

## 2012-12-12 MED ORDER — CEFOXITIN SODIUM-DEXTROSE 1-4 GM-% IV SOLR (PREMIX)
INTRAVENOUS | Status: AC
  Filled 2012-12-12: qty 100

## 2012-12-12 MED ORDER — GLYCOPYRROLATE 0.2 MG/ML IJ SOLN
INTRAMUSCULAR | Status: DC | PRN
  Administered 2012-12-12: 0.6 mg via INTRAVENOUS

## 2012-12-12 MED ORDER — UNJURY CHOCOLATE CLASSIC POWDER
2.0000 [oz_av] | Freq: Four times a day (QID) | ORAL | Status: DC
  Administered 2012-12-14: 2 [oz_av] via ORAL

## 2012-12-12 MED ORDER — LIDOCAINE HCL (CARDIAC) 20 MG/ML IV SOLN
INTRAVENOUS | Status: DC | PRN
  Administered 2012-12-12: 50 mg via INTRAVENOUS

## 2012-12-12 MED ORDER — HEPARIN SODIUM (PORCINE) 5000 UNIT/ML IJ SOLN
INTRAMUSCULAR | Status: AC
  Filled 2012-12-12: qty 1

## 2012-12-12 MED ORDER — MIDAZOLAM HCL 5 MG/5ML IJ SOLN
INTRAMUSCULAR | Status: DC | PRN
  Administered 2012-12-12 (×4): 0.5 mg via INTRAVENOUS
  Administered 2012-12-12: 2 mg via INTRAVENOUS

## 2012-12-12 MED ORDER — KCL IN DEXTROSE-NACL 20-5-0.45 MEQ/L-%-% IV SOLN
INTRAVENOUS | Status: DC
  Administered 2012-12-12 – 2012-12-13 (×2): 1000 mL via INTRAVENOUS
  Administered 2012-12-13 – 2012-12-15 (×5): via INTRAVENOUS
  Filled 2012-12-12 (×8): qty 1000

## 2012-12-12 MED ORDER — LACTATED RINGERS IV SOLN
INTRAVENOUS | Status: DC
  Administered 2012-12-12 (×2): via INTRAVENOUS
  Administered 2012-12-12: 1000 mL via INTRAVENOUS

## 2012-12-12 MED ORDER — ACETAMINOPHEN 10 MG/ML IV SOLN
INTRAVENOUS | Status: DC | PRN
  Administered 2012-12-12: 1000 mg via INTRAVENOUS

## 2012-12-12 MED ORDER — OXYCODONE-ACETAMINOPHEN 5-325 MG/5ML PO SOLN
5.0000 mL | ORAL | Status: DC | PRN
  Administered 2012-12-13 (×2): 5 mL via ORAL
  Administered 2012-12-13 – 2012-12-15 (×7): 10 mL via ORAL
  Filled 2012-12-12 (×2): qty 10
  Filled 2012-12-12: qty 5
  Filled 2012-12-12 (×2): qty 10
  Filled 2012-12-12: qty 5
  Filled 2012-12-12 (×3): qty 10

## 2012-12-12 MED ORDER — LACTATED RINGERS IR SOLN
Status: DC | PRN
  Administered 2012-12-12: 3000 mL

## 2012-12-12 MED ORDER — ROCURONIUM BROMIDE 100 MG/10ML IV SOLN
INTRAVENOUS | Status: DC | PRN
  Administered 2012-12-12: 50 mg via INTRAVENOUS
  Administered 2012-12-12 (×3): 5 mg via INTRAVENOUS
  Administered 2012-12-12 (×3): 10 mg via INTRAVENOUS

## 2012-12-12 MED ORDER — FENTANYL CITRATE 0.05 MG/ML IJ SOLN
INTRAMUSCULAR | Status: DC | PRN
  Administered 2012-12-12: 25 ug via INTRAVENOUS
  Administered 2012-12-12: 50 ug via INTRAVENOUS
  Administered 2012-12-12: 25 ug via INTRAVENOUS
  Administered 2012-12-12: 100 ug via INTRAVENOUS
  Administered 2012-12-12: 50 ug via INTRAVENOUS

## 2012-12-12 MED ORDER — MORPHINE SULFATE 2 MG/ML IJ SOLN
2.0000 mg | INTRAMUSCULAR | Status: DC | PRN
  Administered 2012-12-12 – 2012-12-13 (×6): 4 mg via INTRAVENOUS
  Filled 2012-12-12 (×6): qty 2

## 2012-12-12 MED ORDER — HYDROMORPHONE HCL PF 1 MG/ML IJ SOLN
0.2500 mg | INTRAMUSCULAR | Status: DC | PRN
  Administered 2012-12-12 (×2): 0.5 mg via INTRAVENOUS

## 2012-12-12 SURGICAL SUPPLY — 70 items
APL SKNCLS STERI-STRIP NONHPOA (GAUZE/BANDAGES/DRESSINGS)
APPLIER CLIP ROT 10 11.4 M/L (STAPLE)
APR CLP MED LRG 11.4X10 (STAPLE)
BENZOIN TINCTURE PRP APPL 2/3 (GAUZE/BANDAGES/DRESSINGS) ×2 IMPLANT
CABLE HIGH FREQUENCY MONO STRZ (ELECTRODE) ×1 IMPLANT
CANISTER SUCTION 2500CC (MISCELLANEOUS) ×1 IMPLANT
CLAMP ENDO BABCK 10MM (STAPLE) IMPLANT
CLIP APPLIE ROT 10 11.4 M/L (STAPLE) IMPLANT
CLOTH BEACON ORANGE TIMEOUT ST (SAFETY) ×3 IMPLANT
COVER SURGICAL LIGHT HANDLE (MISCELLANEOUS) ×2 IMPLANT
DECANTER SPIKE VIAL GLASS SM (MISCELLANEOUS) ×2 IMPLANT
DEVICE SUT QUICK LOAD TK 5 (STAPLE) ×2 IMPLANT
DEVICE SUT TI-KNOT TK 5X26 (MISCELLANEOUS) ×1 IMPLANT
DEVICE SUTURE ENDOST 10MM (ENDOMECHANICALS) ×3 IMPLANT
DISSECTOR BLUNT TIP ENDO 5MM (MISCELLANEOUS) ×3 IMPLANT
DRAIN PENROSE 18X1/2 LTX STRL (DRAIN) ×3 IMPLANT
DRAPE LAPAROSCOPIC ABDOMINAL (DRAPES) ×3 IMPLANT
ELECT REM PT RETURN 9FT ADLT (ELECTROSURGICAL) ×3
ELECTRODE REM PT RTRN 9FT ADLT (ELECTROSURGICAL) ×2 IMPLANT
FILTER SMOKE EVAC LAPAROSHD (FILTER) IMPLANT
GLOVE BIOGEL M 8.0 STRL (GLOVE) ×3 IMPLANT
GLOVE BIOGEL PI IND STRL 6 (GLOVE) IMPLANT
GLOVE BIOGEL PI IND STRL 7.0 (GLOVE) IMPLANT
GLOVE BIOGEL PI INDICATOR 6 (GLOVE) ×1
GLOVE BIOGEL PI INDICATOR 7.0 (GLOVE) ×1
GLOVE SURG SIGNA 7.5 PF LTX (GLOVE) ×2 IMPLANT
GLOVE SURG SS PI 6.5 STRL IVOR (GLOVE) ×1 IMPLANT
GLOVE SURG SS PI 8.5 STRL IVOR (GLOVE) ×2
GLOVE SURG SS PI 8.5 STRL STRW (GLOVE) IMPLANT
GOWN STRL NON-REIN LRG LVL3 (GOWN DISPOSABLE) ×5 IMPLANT
GOWN STRL REIN 2XL XLG LVL4 (GOWN DISPOSABLE) ×1 IMPLANT
GOWN STRL REIN XL XLG (GOWN DISPOSABLE) ×4 IMPLANT
GRASPER ENDO BABCOCK 10 (MISCELLANEOUS) IMPLANT
GRASPER ENDO BABCOCK 10MM (MISCELLANEOUS)
HAND ACTIVATED (MISCELLANEOUS) ×3 IMPLANT
IV LACTATED RINGERS 1000ML (IV SOLUTION) ×1 IMPLANT
KIT BASIN OR (CUSTOM PROCEDURE TRAY) ×3 IMPLANT
NDL BLUNT 17GA (NEEDLE) IMPLANT
NEEDLE BLUNT 17GA (NEEDLE) ×3 IMPLANT
NS IRRIG 1000ML POUR BTL (IV SOLUTION) ×3 IMPLANT
PENCIL BUTTON HOLSTER BLD 10FT (ELECTRODE) IMPLANT
SCISSORS LAP 5X35 DISP (ENDOMECHANICALS) ×3 IMPLANT
SET IRRIG TUBING LAPAROSCOPIC (IRRIGATION / IRRIGATOR) ×3 IMPLANT
SLEEVE ADV FIXATION 5X100MM (TROCAR) IMPLANT
SLEEVE XCEL OPT CAN 5 100 (ENDOMECHANICALS) ×4 IMPLANT
SOLUTION ANTI FOG 6CC (MISCELLANEOUS) ×3 IMPLANT
STAPLER VISISTAT 35W (STAPLE) ×3 IMPLANT
STRIP CLOSURE SKIN 1/2X4 (GAUZE/BANDAGES/DRESSINGS) IMPLANT
SUT SURGIDAC NAB ES-9 0 48 120 (SUTURE) ×10 IMPLANT
SUT VIC AB 4-0 SH 18 (SUTURE) ×3 IMPLANT
SYR 30ML LL (SYRINGE) ×3 IMPLANT
SYR 50ML LL SCALE MARK (SYRINGE) ×1 IMPLANT
TIP INNERVISION DETACH 40FR (MISCELLANEOUS) IMPLANT
TIP INNERVISION DETACH 50FR (MISCELLANEOUS) IMPLANT
TIP INNERVISION DETACH 56FR (MISCELLANEOUS) IMPLANT
TIPS INNERVISION DETACH 40FR (MISCELLANEOUS)
TRAY FOLEY CATH 14FRSI W/METER (CATHETERS) ×3 IMPLANT
TRAY LAP CHOLE (CUSTOM PROCEDURE TRAY) ×3 IMPLANT
TROCAR ADV FIXATION 11X100MM (TROCAR) IMPLANT
TROCAR ADV FIXATION 5X100MM (TROCAR) IMPLANT
TROCAR XCEL BLUNT TIP 100MML (ENDOMECHANICALS) IMPLANT
TROCAR XCEL NON-BLD 11X100MML (ENDOMECHANICALS) ×1 IMPLANT
TROCAR XCEL NON-BLD 5MMX100MML (ENDOMECHANICALS) ×1 IMPLANT
TROCAR Z-THREAD FIOS 11X100 BL (TROCAR) ×2 IMPLANT
TROCAR Z-THREAD FIOS 5X100MM (TROCAR) ×2 IMPLANT
TROCAR Z-THREAD SLEEVE 11X100 (TROCAR) IMPLANT
TUBING CONNECTING 10 (TUBING) ×1 IMPLANT
TUBING ENDO SMARTCAP (MISCELLANEOUS) ×1 IMPLANT
TUBING FILTER THERMOFLATOR (ELECTROSURGICAL) ×3 IMPLANT
WATER STERILE IRR 500ML POUR (IV SOLUTION) ×1 IMPLANT

## 2012-12-12 NOTE — Transfer of Care (Signed)
Immediate Anesthesia Transfer of Care Note  Patient: [name]  Procedure(s) Performed: Procedure(s): LAPAROSCOPIC TAKEDOWN  PERI-HIATAL HERNIA    REPAIR (N/A) UPPER GI ENDOSCOPY (N/A)  Patient Location: PACU  Anesthesia Type:General  Level of Consciousness: awake, alert , oriented and patient cooperative  Airway & Oxygen Therapy: Patient Spontanous Breathing and Patient connected to face mask oxygen  Post-op Assessment: Report given to PACU RN and Post -op Vital signs reviewed and stable  Post vital signs: Reviewed and stable  Complications: No apparent anesthesia complications

## 2012-12-12 NOTE — Progress Notes (Signed)
Patient and family informed that surgery is delayed. Unable to give time that Dr Daphine Deutscher will be able to proceed with her surgery. Family verbalizes understanding.

## 2012-12-12 NOTE — Progress Notes (Signed)
INR = 1.2 after holding coumadin x 3 days in preparation for laprascopic nissen and hiatus hernia repair scheduled for this morning.  We will f/u with Ms Day after d/c from hospital after procedure today.  Lovenox samples can be provided for Ms Wengert as needed.

## 2012-12-12 NOTE — Op Note (Signed)
12/12/2012  5:23 PM  PATIENT:  [name], 67 y.o., female, MRN: [id number]  PREOP DIAGNOSIS:  Slipped Nissen fundoplication  POSTOP DIAGNOSIS:   Slipped Nissen fundoplication.  Two diverticula of distal esophagus.  Gastric polyps.  PROCEDURE:  Esophagogastroscopy  SURGEON:   Ovidio Kin, M.D.  ANESTHESIA:   General endotracheal.  INDICATIONS FOR PROCEDURE:  [name] is a 67 y.o. (DOB: 01/21/1946)  white  female whose primary care physician is KAPLAN,KRISTEN, PA-C and has had a laparoscopic exploration for a slipped Nissen fundoplication.  Her open Nissen was performed by Dr. Early Chars in the late 1990's.  I am doing an upper endoscopy to evaluate esophagus, stomach, and GE junction.  PROCEDURE:  The patient was under general anesthesia in Room #1 at [facility].  She has undergone a laparoscopic dissection of her proximal stomach by Dr. Sheron Nightingale for a slipped Nissen fundoplication.   A flexible Olympus endoscope was passed down the throat without difficulty.  Findings include:   Esophagus:   At the distal esophagus (about 35 cm), there were two esophageal diverticula.  The larger one had an approximate 1 cm opening to thinned out esophageal lining.  The smaller one was about 1/3 the size of the larger and immediately beside it.  These diverticula were about 2 or 3 cm above the EG junction, were covered with squamous mucosa.  The was no evidence of Barrett's esophagitis.   GE junction at:  37 to 38 cm   Stomach: Unremarkable, except for gastric polyps.  Dr. Daphine Deutscher flooded the abdomen with saline, I insufflated air into the stomach, and we saw no evidence of a leak or bubbles.   Duodenum:   No intubated  PLAN:  Dr. Daphine Deutscher will follow and dictate main operative note.  Ovidio Kin, MD, [facility] Pager: [phone] Office phone:  [phone]

## 2012-12-12 NOTE — Anesthesia Postprocedure Evaluation (Signed)
  Anesthesia Post-op Note  Patient: [name]  Procedure(s) Performed: Procedure(s) (LRB): LAPAROSCOPIC TAKEDOWN  PERI-HIATAL HERNIA    REPAIR (N/A) UPPER GI ENDOSCOPY (N/A)  Patient Location: PACU  Anesthesia Type: General  Level of Consciousness: awake and alert   Airway and Oxygen Therapy: Patient Spontanous Breathing  Post-op Pain: mild  Post-op Assessment: Post-op Vital signs reviewed, Patient's Cardiovascular Status Stable, Respiratory Function Stable, Patent Airway and No signs of Nausea or vomiting  Last Vitals:  Filed Vitals:   12/12/12 0845  BP: 131/74  Pulse: 66  Temp: 36.3 C  Resp: 16    Post-op Vital Signs: stable   Complications: No apparent anesthesia complications

## 2012-12-12 NOTE — H&P (View-Only) (Signed)
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 Lillia Abed in 1998  History of Present Illness:  [name] is an 67 y.o. female who has underlying antiphospholipid syndrome and on chronic anticoagulation is having severe hoarseness.  Upper GI shows her wrap is unwrapped. She describes a very complicated perioperative history that is not indicated in Dr. Renaldo Harrison discharge summary.  She did have some problem with a pleural effusion and possibly hemorrhage. I explained the risk of try to do this laparoscopically and possible converting to open. She is aware of this. I did get an esophageal manometry which was normal when read by Jesusita Oka at [facility]. This is from the computer generated report since Dr. Bosie Clos has not formally read the study that was done 2 weeks ago.  We'll go and set up for laparoscopic redo Nissen fundoplication [facility].  Past Medical History  Diagnosis Date  . Chest pain   . Hx-TIA (transient ischemic attack)   . Hypothyroidism   . Breast cancer   . History of DVT (deep vein thrombosis)   . History of antiphospholipid antibody syndrome   . 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 Arthritis   . Hyperlipidemia   . Neuromuscular disorder     Past Surgical History  Procedure Laterality Date  . Cardiac catheterization  07/12/1997    NORMAL LEFT VENTRICULAR FUNCTION WITH EF AT LEAST 70-75%  . Thyroidectomy    . Left oophorectomy  1982  . Right oophorectomy  1980  . Abdominal hysterectomy    . Esophageal manometry N/A 11/06/2012    Procedure: ESOPHAGEAL MANOMETRY (EM);  Surgeon: Valarie Merino, MD;  Location: [facility];  Service: General;  Laterality: N/A;    Current Outpatient Prescriptions  Medication Sig Dispense Refill  . ALPRAZolam (XANAX) 1 MG tablet Take 0.5 mg by mouth 3 (three) times daily.       [name] aspirin 81 MG tablet Take  81 mg by mouth daily.        . Calcium Carbonate-Vitamin D (CALCIUM + D PO) Take 600 mg by mouth. Taking 1 Tablet Daily Tablet contains Magnesium and Zinc.      . HYDROcodone-acetaminophen (NORCO/VICODIN) 5-325 MG per tablet Take 1 tablet by mouth every 6 (six) hours as needed. pain      . levothyroxine (SYNTHROID, LEVOTHROID) 100 MCG tablet Take 100 mcg by mouth daily.      . metoprolol succinate (TOPROL-XL) 25 MG 24 hr tablet Take 25 mg by mouth daily.        [name] omeprazole (PRILOSEC) 20 MG capsule Take 20 mg by mouth 2 (two) times daily.        . pravastatin (PRAVACHOL) 40 MG tablet Take 40 mg by mouth at bedtime.       [name] tiZANidine (ZANAFLEX) 4 MG capsule Take 4 mg by mouth every 6 (six) hours as needed. Muscle pain      . warfarin (COUMADIN) 4 MG tablet Take 6 mg by mouth daily. Patient takes 1.5 tablets daily      . warfarin (COUMADIN) 4 MG tablet TAKE 2 TABS DAILY EXCEPT 1 AND 1/2 TABS ON SATURDAY AND SUNDAY  168 tablet  2  . zolpidem (AMBIEN) 10 MG tablet Take 5-10 mg by mouth at bedtime.       [name] buPROPion (WELLBUTRIN SR) 200 MG 12 hr tablet Take 200 mg by mouth 2 (  two) times daily.        . [DISCONTINUED] fluticasone (FLONASE) 50 MCG/ACT nasal spray Place 2 sprays into the nose daily.         No current facility-administered medications for this visit.   Benadryl; Crestor; Daypro; Hornet venom; Levofloxacin; Levofloxacin; Sulfonamide derivatives; Vitamin k; and Zocor Family History  Problem Relation Age of Onset  . Cancer Maternal Grandmother     colon   Social History:   reports that she has never smoked. She has never used smokeless tobacco. She reports that she does not drink alcohol or use illicit drugs.   REVIEW OF SYSTEMS - PERTINENT POSITIVES ONLY: Positive for anticoagulation  Physical Exam:   Blood pressure 126/84, pulse 80, temperature 97.1 F (36.2 C), temperature source Oral, resp. rate 16, height 5\' 4"  (1.626 m), weight 176 lb (79.833 kg). Body mass index is 30.2  kg/(m^2).  Gen:  WDWN WF NAD  Neurological: Alert and oriented to person, place, and time. Motor and sensory function is grossly intact  Head: Normocephalic and atraumatic.  Eyes: Conjunctivae are normal. Pupils are equal, round, and reactive to light. No scleral icterus.  Neck: Normal range of motion. Neck supple. No tracheal deviation or thyromegaly present.  Cardiovascular:  SR without murmurs or gallops.  No carotid bruits Respiratory: Effort normal.  No respiratory distress. No chest wall tenderness. Breath sounds normal.  No wheezes, rales or rhonchi.  Abdomen:  Long midline incision GU: Musculoskeletal: Normal range of motion. Extremities are nontender. No cyanosis, edema or clubbing noted Lymphadenopathy: No cervical, preauricular, postauricular or axillary adenopathy is present Skin: Skin is warm and dry. No rash noted. No diaphoresis. No erythema. No pallor. Pscyh: Normal mood and affect. Behavior is normal. Judgment and thought content normal.   LABORATORY RESULTS: No results found for this or any previous visit (from the past 48 hour(s)).  RADIOLOGY RESULTS: No results found.  Problem List: Patient Active Problem List   Diagnosis Date Noted  . Dysphagia, unspecified 09/28/2012  . S/P Open Nissen 1998 Dr. Mardella Layman 08/18/2012  . 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 Lupus anticoagulant positive 06/01/2011  . Chest pain   . Hx-TIA (transient ischemic attack)   . Hypothyroidism   . Breast cancer   . History of DVT (deep vein thrombosis)   . 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  Will proceed with laparoscopic redo Nissen and repair of hiatus hernia    [name], MD, [facility] [phone] beeper [phone]  11/23/2012 9:44 AM

## 2012-12-12 NOTE — Brief Op Note (Signed)
12/12/2012  5:45 PM  PATIENT:  [name]  67 y.o. female  PRE-OPERATIVE DIAGNOSIS:  Recurrent GERD   POST-OPERATIVE DIAGNOSIS:  recurrent gerd/esophageal diverticula  PROCEDURE:  Procedure(s): LAPAROSCOPIC TAKEDOWN  PERI-HIATAL HERNIA  WITH  REPAIR (N/A) UPPER GI ENDOSCOPY (N/A)  SURGEON:  Surgeon(s) and Role:    * Kandis Cocking, MD - Assisting    * Kandis Cocking, MD - Assisting    * Valarie Merino, MD - Primary  PHYSICIAN ASSISTANT:   ASSISTANTS: Ovidio Kin, MD FACS   ANESTHESIA:   general  EBL:  Total I/O In: 2000 [I.V.:2000] Out: 400 [Urine:350; Blood:50]  BLOOD ADMINISTERED:none  DRAINS: none   LOCAL MEDICATIONS USED:  MARCAINE     SPECIMEN:  No Specimen  DISPOSITION OF SPECIMEN:  N/A  COUNTS:  YES  TOURNIQUET:  * No tourniquets in log *  DICTATION: .Other Dictation: Dictation Number [id number]  PLAN OF CARE: Admit to inpatient   PATIENT DISPOSITION:  PACU - hemodynamically stable.   Delay start of Pharmacological VTE agent (>24hrs) due to surgical blood loss or risk of bleeding: no

## 2012-12-12 NOTE — Interval H&P Note (Signed)
History and Physical Interval Note:  12/12/2012 1:42 PM  [name]  has presented today for surgery, with the diagnosis of Recurrent GERD   The various methods of treatment have been discussed with the patient and family. After consideration of risks, benefits and other options for treatment, the patient has consented to  Procedure(s): LAPAROSCOPIC NISSEN FUNDOPLICATION POSSIBLE OPEN, HIATAL HERNIA (N/A) as a surgical intervention .  The patient's history has been reviewed, patient examined, no change in status, stable for surgery.  I have reviewed the patient's chart and labs.  Questions were answered to the patient's satisfaction.     Marquette Blodgett B

## 2012-12-13 ENCOUNTER — Encounter (HOSPITAL_COMMUNITY): Payer: Self-pay | Admitting: Surgery

## 2012-12-13 ENCOUNTER — Inpatient Hospital Stay (HOSPITAL_COMMUNITY): Payer: Medicare Other

## 2012-12-13 DIAGNOSIS — Z7901 Long term (current) use of anticoagulants: Secondary | ICD-10-CM

## 2012-12-13 DIAGNOSIS — Z86718 Personal history of other venous thrombosis and embolism: Secondary | ICD-10-CM

## 2012-12-13 DIAGNOSIS — Z09 Encounter for follow-up examination after completed treatment for conditions other than malignant neoplasm: Secondary | ICD-10-CM

## 2012-12-13 DIAGNOSIS — D6859 Other primary thrombophilia: Secondary | ICD-10-CM

## 2012-12-13 LAB — CBC WITH DIFFERENTIAL/PLATELET
Basophils Absolute: 0 10*3/uL (ref 0.0–0.1)
Basophils Relative: 0 % (ref 0–1)
Eosinophils Absolute: 0 10*3/uL (ref 0.0–0.7)
Eosinophils Relative: 1 % (ref 0–5)
HCT: 32.1 % — ABNORMAL LOW (ref 36.0–46.0)
Hemoglobin: 10.3 g/dL — ABNORMAL LOW (ref 12.0–15.0)
Lymphocytes Relative: 15 % (ref 12–46)
Lymphs Abs: 1.1 10*3/uL (ref 0.7–4.0)
MCH: 29.8 pg (ref 26.0–34.0)
MCHC: 32.1 g/dL (ref 30.0–36.0)
MCV: 92.8 fL (ref 78.0–100.0)
Monocytes Absolute: 0.4 10*3/uL (ref 0.1–1.0)
Monocytes Relative: 6 % (ref 3–12)
Neutro Abs: 5.6 10*3/uL (ref 1.7–7.7)
Neutrophils Relative %: 79 % — ABNORMAL HIGH (ref 43–77)
Platelets: 169 10*3/uL (ref 150–400)
RBC: 3.46 MIL/uL — ABNORMAL LOW (ref 3.87–5.11)
RDW: 13.3 % (ref 11.5–15.5)
WBC: 7.1 10*3/uL (ref 4.0–10.5)

## 2012-12-13 LAB — HEMOGLOBIN AND HEMATOCRIT, BLOOD
HCT: 31.3 % — ABNORMAL LOW (ref 36.0–46.0)
Hemoglobin: 10.3 g/dL — ABNORMAL LOW (ref 12.0–15.0)

## 2012-12-13 MED ORDER — IOHEXOL 300 MG/ML  SOLN
150.0000 mL | Freq: Once | INTRAMUSCULAR | Status: AC | PRN
  Administered 2012-12-13: 50 mL via ORAL

## 2012-12-13 NOTE — Progress Notes (Signed)
VASCULAR LAB PRELIMINARY  PRELIMINARY  PRELIMINARY  PRELIMINARY  Bilateral lower extremity venous duplex  completed.    Preliminary report:  Bilateral:  No evidence of DVT, superficial thrombosis, or Baker's Cyst.    Kayloni Rocco, RVT 12/13/2012, 8:46 AM

## 2012-12-13 NOTE — Progress Notes (Signed)
Pt preparing to be transferred to 5 west; removed b/p cuff from left arm and noticed redness to IV area; pt stated it did hurt some; removed IV; area around and above site, some swelling noted; pt states area feels better since IV removed; MD notified; no orders at this time

## 2012-12-13 NOTE — Op Note (Signed)
[name], [name]              ACCOUNT NO.:  [id number]  MEDICAL RECORD NO.:  [id number]  LOCATION:  1223                         FACILITY:  [facility]  PHYSICIAN:  Thornton Park. Daphine Deutscher, MD  DATE OF BIRTH:  11-26-1945  DATE OF PROCEDURE: DATE OF DISCHARGE:                              OPERATIVE REPORT   PREOPERATIVE DIAGNOSIS:  Prior open Nissen fundoplication with recurrent hiatal hernia.  POSTOPERATIVE DIAGNOSIS:  Hiatal hernia with evidence of esophageal pulsion diverticula.  PROCEDURE:  Laparoscopic enterolysis and delineation of hiatal hernia, upper endoscopy by Dr. Ezzard Standing and posterior repair of hiatus.  Takedown wrap, not trying to redo wrap since we could not insert a bougie and there is evidence of high pressure in the distal esophagus.  SURGEON:  Thornton Park. Daphine Deutscher, MD  ASSISTANT:  Sandria Bales. Ezzard Standing, M.D.  ANESTHESIA:  General endotracheal.  DESCRIPTION OF PROCEDURE:  The patient was taken to room #1, given general anesthesia.  The abdomen was prepped with PCMX and draped sterilely.  Access to the abdomen was achieved through the left upper quadrant using a 0 degree 5 mm Optiview without difficulty.  A total 5 trocar sites were used.  At the end Exparel was injected.  Following insufflation, I began a rather long tedious dissection of the anterior abdominal wall.  Adhesions principally omentum stuck to the anterior abdominal wall.  We then worked our way up.  We found the left lateral segment was stuck anteriorly, we did not have to use a retractor.  We then used a combination of sharp dissection and dissection with the Harmonic Scalpel to work her way out and get above the dissected area along the liver and then did delineate the left and right crura and identified a hiatal hernia.  We took this down again with sharp and Harmonic dissection.  We identified the anterior portion of the wrap, which was pretty which attenuated and we went ahead and incised that and took  that and that allowed that to relax a bit.  In the posterior midline, there was an area that appeared to be maybe possibly part of the crura and perhaps in the left that had been pulled up and separated from the main crural fibers that we noted.  Dr. Allene Pyo endoscopy inflated the stomach and we identified the diverticula that he will describe in his operative note.  These appeared to be probably related to high-pressure.  This would go along with the patient's symptoms at times things hanging up.  Her esophagus after being completely mobilized with a Penrose drain around it, we had ensured good length of esophagus and adequately lengthened the abdomen. We went ahead and repaired the posterior crura with a single Endostitch and then grabbing purchases all across getting left crus in the middle portion of tissue and then grabbing the right crus and securing this with a tie knot.  Because we did not want to wrap her too tightly to exacerbate her problem with high pressure in the lower esophagus and elected to not do any further restriction at the EG junction.  We left just with the hiatal hernia repair, which appeared to be just adequate and not too tight.  Port sites  were injected with Exparel.  The abdomen was reinspected and no enterotomies were noted.  The abdomen was deflated and the port sites were closed with 4-0 Vicryl and with Dermabond.  The patient seemed to tolerated the procedure well and was taken to recovery room in satisfactory condition.     Thornton Park Daphine Deutscher, MD     MBM/MEDQ  D:  12/12/2012  T:  12/13/2012  Job:  [id number]

## 2012-12-13 NOTE — Progress Notes (Signed)
Pt transferred to 5 west; report called; pt stable at time of transfer

## 2012-12-13 NOTE — Progress Notes (Signed)
Patient ID: [name], female   DOB: 09-14-1945, 67 y.o.   MRN: [id number] [facility] Progress Note:   1 Day Post-Op  Subjective: Mental status is clear Objective: Vital signs in last 24 hours: Temp:  [97.3 F (36.3 C)-97.7 F (36.5 C)] 97.5 F (36.4 C) (05/21 0800) Pulse Rate:  [59-85] 66 (05/21 0800) Resp:  [10-19] 19 (05/21 0800) BP: (98-131)/(51-74) 102/51 mmHg (05/21 0800) SpO2:  [93 %-100 %] 93 % (05/21 0800) Weight:  [173 lb 15.1 oz (78.9 kg)] 173 lb 15.1 oz (78.9 kg) (05/21 0728)  Intake/Output from previous day: 05/20 0701 - 05/21 0700 In: 3896.7 [I.V.:3896.7] Out: 1150 [Urine:1100; Blood:50] Intake/Output this shift: Total I/O In: 100 [I.V.:100] Out: 800 [Urine:800]  Physical Exam: Work of breathing is normal.  VSS  Lab Results:  Results for orders placed during the hospital encounter of 12/12/12 (from the past 48 hour(s))  CBC     Status: Abnormal   Collection Time    12/12/12  8:12 PM      Result Value Range   WBC 10.0  4.0 - 10.5 K/uL   RBC 3.49 (*) 3.87 - 5.11 MIL/uL   Hemoglobin 11.1 (*) 12.0 - 15.0 g/dL   HCT 40.9 (*) 81.1 - 91.4 %   MCV 92.3  78.0 - 100.0 fL   MCH 31.8  26.0 - 34.0 pg   MCHC 34.5  30.0 - 36.0 g/dL   RDW 78.2  95.6 - 21.3 %   Platelets 168  150 - 400 K/uL  CREATININE, SERUM     Status: Abnormal   Collection Time    12/12/12  8:12 PM      Result Value Range   Creatinine, Ser 0.95  0.50 - 1.10 mg/dL   GFR calc non Af Amer 62 (*) >90 mL/min   GFR calc Af Amer 71 (*) >90 mL/min   Comment:            The eGFR has been calculated     using the CKD EPI equation.     This calculation has not been     validated in all clinical     situations.     eGFR's persistently     <90 mL/min signify     possible Chronic Kidney Disease.  CBC WITH DIFFERENTIAL     Status: Abnormal   Collection Time    12/13/12  3:45 AM      Result Value Range   WBC 7.1  4.0 - 10.5 K/uL   RBC 3.46 (*) 3.87 - 5.11 MIL/uL   Hemoglobin 10.3  (*) 12.0 - 15.0 g/dL   HCT 08.6 (*) 57.8 - 46.9 %   MCV 92.8  78.0 - 100.0 fL   MCH 29.8  26.0 - 34.0 pg   MCHC 32.1  30.0 - 36.0 g/dL   RDW 62.9  52.8 - 41.3 %   Platelets 169  150 - 400 K/uL   Neutrophils Relative % 79 (*) 43 - 77 %   Neutro Abs 5.6  1.7 - 7.7 K/uL   Lymphocytes Relative 15  12 - 46 %   Lymphs Abs 1.1  0.7 - 4.0 K/uL   Monocytes Relative 6  3 - 12 %   Monocytes Absolute 0.4  0.1 - 1.0 K/uL   Eosinophils Relative 1  0 - 5 %   Eosinophils Absolute 0.0  0.0 - 0.7 K/uL   Basophils Relative 0  0 - 1 %   Basophils Absolute 0.0  0.0 - 0.1 K/uL    Radiology/Results: No results found.  Anti-infectives: Anti-infectives   Start     Dose/Rate Route Frequency Ordered Stop   12/12/12 0846  cefOXitin (MEFOXIN) 2 g in dextrose 5 % 50 mL IVPB     2 g 100 mL/hr over 30 Minutes Intravenous On call to O.R. 12/12/12 0846 12/12/12 1357      Assessment/Plan: Problem List: Patient Active Problem List   Diagnosis Date Noted  . GERD - post failed open Nissen 11/23/2012  . Dysphagia, unspecified 09/28/2012  . S/P Open Nissen 1998 Dr. Mardella Layman 08/18/2012  . 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 Lupus anticoagulant positive 06/01/2011  . Chest pain   . Hx-TIA (transient ischemic attack)   . Hypothyroidism   . Breast cancer   . History of DVT (deep vein thrombosis)   . History of antiphospholipid antibody syndrome     Awaiting UGI.  If ok will start clear liquids and move to floor.  Continue heparin and start coumarin when taking POs  1 Day Post-Op    LOS: 1 day   [name], MD, [facility] [phone] beeper [phone]  12/13/2012 8:44 AM

## 2012-12-13 NOTE — Progress Notes (Signed)
67 year old woman well-known to me. She is on chronic, full dose, anticoagulation due to  previous arterial thrombosis of one of the fingers of her left hand in October 1998  with subsequent findings of antiphospholipid antibody syndrome. She has had no subsequent thrombotic events. She had a previous Nissen fundoplication in 1998 which has broken down and she was admitted yesterday for repair of the same. We instructed the patient to hold her Coumadin over the weekend. Pro time/INR checked in our office on the day of surgery May 20 was 14.4 seconds/1.2. I discussed the situation with Dr. Daphine Deutscher. It is his standard procedure to give low-dose heparin pre-and postop.  She did well with the surgery. Surgery was able to be accomplished laparoscopically. She has 4 small abdominal incisions which are clean and dry this morning. Lungs are clear. Regular cardiac rhythm with distant sounds. Extremities with no edema, no calf tenderness, no cyanosis.  Impression: #1 chronic anticoagulation due to antiphospholipid antibody syndrome/previous arterial thrombosis of a digit. #2. Postop day 1 repair Nissen fundoplication  Recommendation: I would stop the low-dose heparin and changed to Lovenox 1 mg per kilogram today and starting tomorrow 1.5 mg per kilogram daily. I would resume her Coumadin at her current dose history she takes by mouth. We will continue to monitor Coumadin as an outpatient after discharge. Please call for any questions or concerns [phone]  Thank you

## 2012-12-13 NOTE — Progress Notes (Signed)
Patient and daughter felt an odd bump on scalp (right) that resembled a tick, nurse assessed and removed small round insect that resembled a tick. Insect was flushed in toilet. Patient tolerated well.

## 2012-12-13 NOTE — Progress Notes (Signed)
CARE MANAGEMENT NOTE 12/13/2012  Patient:  TYEESHA, RIKER   Account Number:  [id number]  Date Initiated:  12/13/2012  Documentation initiated by:  Sian Rockers  Subjective/Objective Assessment:   post surgical slipped nilsen, hypotensive post op     Action/Plan:   home   Anticipated DC Date:  12/16/2012   Anticipated DC Plan:  HOME/SELF CARE  In-house referral  NA      DC Planning Services  NA      [facility] Choice  NA   Choice offered to / List presented to:  NA   DME arranged  NA      DME agency  NA     HH arranged  NA      HH agency  NA   Status of service:  In process, will continue to follow Medicare Important Message given?  NA - LOS <3 / Initial given by admissions (If response is "NO", the following Medicare IM given date fields will be blank) Date Medicare IM given:   Date Additional Medicare IM given:    Discharge Disposition:    Per UR Regulation:  Reviewed for med. necessity/level of care/duration of stay  If discussed at Long Length of Stay Meetings, dates discussed:    Comments:  [id number] Earlene Plater, RN, BSN, CCM:  CHART REVIEWED AND UPDATED.  Next chart review due on [id number]. NO DISCHARGE NEEDS PRESENT AT THIS TIME. CASE MANAGEMENT [phone]

## 2012-12-14 LAB — CBC WITH DIFFERENTIAL/PLATELET
Basophils Absolute: 0 10*3/uL (ref 0.0–0.1)
Basophils Relative: 0 % (ref 0–1)
Eosinophils Absolute: 0.2 10*3/uL (ref 0.0–0.7)
Eosinophils Relative: 3 % (ref 0–5)
HCT: 31.6 % — ABNORMAL LOW (ref 36.0–46.0)
Hemoglobin: 10.1 g/dL — ABNORMAL LOW (ref 12.0–15.0)
Lymphocytes Relative: 36 % (ref 12–46)
Lymphs Abs: 2 10*3/uL (ref 0.7–4.0)
MCH: 30.1 pg (ref 26.0–34.0)
MCHC: 32 g/dL (ref 30.0–36.0)
MCV: 94.3 fL (ref 78.0–100.0)
Monocytes Absolute: 0.4 10*3/uL (ref 0.1–1.0)
Monocytes Relative: 8 % (ref 3–12)
Neutro Abs: 3 10*3/uL (ref 1.7–7.7)
Neutrophils Relative %: 53 % (ref 43–77)
Platelets: 170 10*3/uL (ref 150–400)
RBC: 3.35 MIL/uL — ABNORMAL LOW (ref 3.87–5.11)
RDW: 13.7 % (ref 11.5–15.5)
WBC: 5.6 10*3/uL (ref 4.0–10.5)

## 2012-12-14 MED ORDER — WARFARIN SODIUM 6 MG PO TABS
6.0000 mg | ORAL_TABLET | Freq: Once | ORAL | Status: AC
  Administered 2012-12-14: 6 mg via ORAL
  Filled 2012-12-14: qty 1

## 2012-12-14 MED ORDER — ENOXAPARIN SODIUM 120 MG/0.8ML ~~LOC~~ SOLN
1.5000 mg/kg | SUBCUTANEOUS | Status: DC
  Administered 2012-12-14 – 2012-12-15 (×2): 120 mg via SUBCUTANEOUS
  Filled 2012-12-14 (×2): qty 0.8

## 2012-12-14 MED ORDER — WARFARIN - PHARMACIST DOSING INPATIENT: Freq: Every day | Status: DC

## 2012-12-14 NOTE — Progress Notes (Signed)
Patient ID: [name], female   DOB: 13-Feb-1946, 67 y.o.   MRN: [id number] [facility] Progress Note:   2 Days Post-Op  Subjective: Mental status is clear;  Not complaints Objective: Vital signs in last 24 hours: Temp:  [98.2 F (36.8 C)-99.4 F (37.4 C)] 99.4 F (37.4 C) (05/22 0606) Pulse Rate:  [71-84] 71 (05/22 0606) Resp:  [13-18] 18 (05/22 0606) BP: (115-130)/(49-65) 130/64 mmHg (05/22 0606) SpO2:  [92 %-96 %] 93 % (05/22 0606)  Intake/Output from previous day: 05/21 0701 - 05/22 0700 In: 2540 [P.O.:240; I.V.:2300] Out: 2450 [Urine:2450] Intake/Output this shift: Total I/O In: 360 [P.O.:360] Out: -   Physical Exam: Work of breathing is normal.  Had some issues with left arm IV and phebotomy this am.    Lab Results:  Results for orders placed during the hospital encounter of 12/12/12 (from the past 48 hour(s))  CBC     Status: Abnormal   Collection Time    12/12/12  8:12 PM      Result Value Range   WBC 10.0  4.0 - 10.5 K/uL   RBC 3.49 (*) 3.87 - 5.11 MIL/uL   Hemoglobin 11.1 (*) 12.0 - 15.0 g/dL   HCT 40.9 (*) 81.1 - 91.4 %   MCV 92.3  78.0 - 100.0 fL   MCH 31.8  26.0 - 34.0 pg   MCHC 34.5  30.0 - 36.0 g/dL   RDW 78.2  95.6 - 21.3 %   Platelets 168  150 - 400 K/uL  CREATININE, SERUM     Status: Abnormal   Collection Time    12/12/12  8:12 PM      Result Value Range   Creatinine, Ser 0.95  0.50 - 1.10 mg/dL   GFR calc non Af Amer 62 (*) >90 mL/min   GFR calc Af Amer 71 (*) >90 mL/min   Comment:            The eGFR has been calculated     using the CKD EPI equation.     This calculation has not been     validated in all clinical     situations.     eGFR's persistently     <90 mL/min signify     possible Chronic Kidney Disease.  CBC WITH DIFFERENTIAL     Status: Abnormal   Collection Time    12/13/12  3:45 AM      Result Value Range   WBC 7.1  4.0 - 10.5 K/uL   RBC 3.46 (*) 3.87 - 5.11 MIL/uL   Hemoglobin 10.3 (*) 12.0 - 15.0 g/dL    HCT 08.6 (*) 57.8 - 46.0 %   MCV 92.8  78.0 - 100.0 fL   MCH 29.8  26.0 - 34.0 pg   MCHC 32.1  30.0 - 36.0 g/dL   RDW 46.9  62.9 - 52.8 %   Platelets 169  150 - 400 K/uL   Neutrophils Relative % 79 (*) 43 - 77 %   Neutro Abs 5.6  1.7 - 7.7 K/uL   Lymphocytes Relative 15  12 - 46 %   Lymphs Abs 1.1  0.7 - 4.0 K/uL   Monocytes Relative 6  3 - 12 %   Monocytes Absolute 0.4  0.1 - 1.0 K/uL   Eosinophils Relative 1  0 - 5 %   Eosinophils Absolute 0.0  0.0 - 0.7 K/uL   Basophils Relative 0  0 - 1 %   Basophils Absolute 0.0  0.0 - 0.1 K/uL  HEMOGLOBIN AND HEMATOCRIT, BLOOD     Status: Abnormal   Collection Time    12/13/12  3:59 PM      Result Value Range   Hemoglobin 10.3 (*) 12.0 - 15.0 g/dL   HCT 16.1 (*) 09.6 - 04.5 %  CBC WITH DIFFERENTIAL     Status: Abnormal   Collection Time    12/14/12  4:56 AM      Result Value Range   WBC 5.6  4.0 - 10.5 K/uL   RBC 3.35 (*) 3.87 - 5.11 MIL/uL   Hemoglobin 10.1 (*) 12.0 - 15.0 g/dL   HCT 40.9 (*) 81.1 - 91.4 %   MCV 94.3  78.0 - 100.0 fL   MCH 30.1  26.0 - 34.0 pg   MCHC 32.0  30.0 - 36.0 g/dL   RDW 78.2  95.6 - 21.3 %   Platelets 170  150 - 400 K/uL   Neutrophils Relative % 53  43 - 77 %   Neutro Abs 3.0  1.7 - 7.7 K/uL   Lymphocytes Relative 36  12 - 46 %   Lymphs Abs 2.0  0.7 - 4.0 K/uL   Monocytes Relative 8  3 - 12 %   Monocytes Absolute 0.4  0.1 - 1.0 K/uL   Eosinophils Relative 3  0 - 5 %   Eosinophils Absolute 0.2  0.0 - 0.7 K/uL   Basophils Relative 0  0 - 1 %   Basophils Absolute 0.0  0.0 - 0.1 K/uL    Radiology/Results: Dg Ugi W/water Sol Cm  12/13/2012   *RADIOLOGY REPORT*  Clinical Data: Postop hiatal hernia repair  ESOPHOGRAM/BARIUM SWALLOW  Technique:  Single contrast examination was performed using water soluble contrast.  Fluoroscopy time:  20 seconds  Comparison:  None.  Findings:  Surgical clips at the GE junction.  Moderate esophageal dysmotility.  Mild narrowing/restriction at the GE junction, likely  reflecting postoperative edema.  However, contrast passed freely into the stomach.  No residual hiatal hernia.  No extravasation of contrast to suggest a leak.  IMPRESSION: No evidence of leak.  Mild restriction at the GE junction, likely reflecting postoperative edema.  However, contrast passed freely into the stomach.  Moderate esophageal dysmotility.   Original Report Authenticated By: Charline Bills, M.D.    Anti-infectives: Anti-infectives   Start     Dose/Rate Route Frequency Ordered Stop   12/12/12 0846  cefOXitin (MEFOXIN) 2 g in dextrose 5 % 50 mL IVPB     2 g 100 mL/hr over 30 Minutes Intravenous On call to O.R. 12/12/12 0846 12/12/12 1357      Assessment/Plan: Problem List: Patient Active Problem List   Diagnosis Date Noted  . GERD - post failed open Nissen 11/23/2012  . Dysphagia, unspecified 09/28/2012  . S/P Open Nissen 1998 Dr. Mardella Layman 08/18/2012  . 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 Lupus anticoagulant positive 06/01/2011  . Chest pain   . Hx-TIA (transient ischemic attack)   . Hypothyroidism   . Breast cancer   . History of DVT (deep vein thrombosis)   . History of antiphospholipid antibody syndrome     Will advance diet to full and place on Lovenox bridge in anticipation for discharge tomorrow.  2 Days Post-Op    LOS: 2 days   [name], MD, [facility] [phone] beeper [phone]  12/14/2012 8:44 AM

## 2012-12-14 NOTE — Progress Notes (Signed)
ANTICOAGULATION CONSULT NOTE - Initial Consult  Pharmacy Consult for Lovenox/Warfarin Indication: VTE prophylaxis and chronic anticoagulation   Patient Measurements: Height: 5\' 5"  (165.1 cm) Weight: 173 lb 15.1 oz (78.9 kg) IBW/kg (Calculated) : 57  Vital Signs: Temp: 99.4 F (37.4 C) (05/22 0606) Temp src: Oral (05/22 0606) BP: 130/64 mmHg (05/22 0606) Pulse Rate: 71 (05/22 0606)  Labs:  Recent Labs  12/12/12 12/12/12 0827  12/12/12 2012 12/13/12 0345 12/13/12 1559 12/14/12 0456  HGB  --   --   < > 11.1* 10.3* 10.3* 10.1*  HCT  --   --   < > 32.2* 32.1* 31.3* 31.6*  PLT  --   --   --  168 169  --  170  INR 1.2 1.20*  --   --   --   --   --   CREATININE  --   --   --  0.95  --   --   --   < > = values in this interval not displayed.  Estimated Creatinine Clearance: 61.3 ml/min (by C-G formula based on Cr of 0.95).   Medical History: Past Medical History  Diagnosis Date  . Chest pain   . Hx-TIA (transient ischemic attack)   . Hypothyroidism   . History of DVT (deep vein thrombosis)     in all fingers  . History of antiphospholipid antibody syndrome   . 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 Arthritis   . Hyperlipidemia   . Neuromuscular disorder   . Complication of anesthesia     "hard to put asleep"  . Stroke   . Breast cancer     right  . Hypertension   . Fibromyalgia   . Hepatitis 1990    "from eating at restaurant"  . Headache   . H/O hiatal hernia   . GERD (gastroesophageal reflux disease)   . Asthma     as child  . Pneumonia     hx of  . Collapsed lung     hx of, left  . Depression   . Anxiety     severe panic attacks      Assessment: 66 yoF on chronic warfarin therapy for h/o antiphospholipid antibody syndrome and positive for Lupus anticoagulant with previous arterial thrombosis of one of her fingers.  Patient is seen at [facility] for  anti-coagulation.  Warfarin has been on hold PTA (last dose 5/16) in preparation for laprascopic nissen and hiatus hernia repair performed 5/20.  Surgery ordered to start VTE prophylaxis on POD1 with SQ Heparin.  Today (POD2), pharmacy asked to transition to Lovenox bridge with resumption of warfarin.  [facility] anti-coagulation clinic and Dr. Cyndie Chime recommendations for post-op were to resume warfarin at home dose of 6 mg and start Lovenox 1.5 mg/kg daily on POD2.  Will continue with their plan.    Weight: 78.9 kg  SCr 0.95, CrCl~61 ml/min  Hgb 10.1, Platelets 170K  INR 1.2 (5/20) prior to surgery  Goal of Therapy:  INR 2-3 Anti-Xa level 0.6-1.2 units/ml 4hrs after LMWH dose given Monitor platelets by anticoagulation protocol: Yes   Plan:  1.  Stop SQ Heparin (last dose given at 0510 this AM). 2.  Start Lovenox 120 mg (~1.5 mg/kg) SQ q24h.  Begin at 1200 today. 3.  Warfarin 6 mg po once today.  Also give at 1200 today. 4.  F/u INR daily and CBC q72h.  Clance Boll 12/14/2012,9:04 AM

## 2012-12-15 ENCOUNTER — Other Ambulatory Visit (HOSPITAL_COMMUNITY): Payer: Self-pay | Admitting: Oncology

## 2012-12-15 DIAGNOSIS — I742 Embolism and thrombosis of arteries of the upper extremities: Secondary | ICD-10-CM

## 2012-12-15 DIAGNOSIS — D6861 Antiphospholipid syndrome: Secondary | ICD-10-CM

## 2012-12-15 DIAGNOSIS — Z862 Personal history of diseases of the blood and blood-forming organs and certain disorders involving the immune mechanism: Secondary | ICD-10-CM

## 2012-12-15 LAB — PROTIME-INR
INR: 1 (ref 0.00–1.49)
Prothrombin Time: 13.1 seconds (ref 11.6–15.2)

## 2012-12-15 MED ORDER — OXYCODONE-ACETAMINOPHEN 5-325 MG/5ML PO SOLN: 5.0000 mL | ORAL | Status: DC | PRN

## 2012-12-15 MED ORDER — WARFARIN SODIUM 6 MG PO TABS
6.0000 mg | ORAL_TABLET | Freq: Every day | ORAL | Status: DC
  Filled 2012-12-15: qty 1

## 2012-12-15 NOTE — Progress Notes (Signed)
ANTICOAGULATION CONSULT NOTE - Initial Consult  Pharmacy Consult for Lovenox/Warfarin Indication: VTE prophylaxis and chronic anticoagulation   Patient Measurements: Height: 5\' 5"  (165.1 cm) Weight: 173 lb 15.1 oz (78.9 kg) IBW/kg (Calculated) : 57  Vital Signs: Temp: 98.8 F (37.1 C) (05/23 0500) Temp src: Oral (05/23 0500) BP: 117/74 mmHg (05/23 0500) Pulse Rate: 72 (05/23 0500)  Labs:  Recent Labs  12/12/12 0827  12/12/12 2012 12/13/12 0345 12/13/12 1559 12/14/12 0456 12/15/12 0430  HGB  --   < > 11.1* 10.3* 10.3* 10.1*  --   HCT  --   < > 32.2* 32.1* 31.3* 31.6*  --   PLT  --   --  168 169  --  170  --   LABPROT  --   --   --   --   --   --  13.1  INR 1.20*  --   --   --   --   --  1.00  CREATININE  --   --  0.95  --   --   --   --   < > = values in this interval not displayed.  Estimated Creatinine Clearance: 61.3 ml/min (by C-G formula based on Cr of 0.95).   Medical History: Past Medical History  Diagnosis Date  . Chest pain   . Hx-TIA (transient ischemic attack)   . Hypothyroidism   . History of DVT (deep vein thrombosis)     in all fingers  . History of antiphospholipid antibody syndrome   . 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 Arthritis   . Hyperlipidemia   . Neuromuscular disorder   . Complication of anesthesia     "hard to put asleep"  . Stroke   . Breast cancer     right  . Hypertension   . Fibromyalgia   . Hepatitis 1990    "from eating at restaurant"  . Headache   . H/O hiatal hernia   . GERD (gastroesophageal reflux disease)   . Asthma     as child  . Pneumonia     hx of  . Collapsed lung     hx of, left  . Depression   . Anxiety     severe panic attacks      Assessment: 66 yoF on chronic warfarin therapy for h/o antiphospholipid antibody syndrome and positive for Lupus anticoagulant with previous arterial thrombosis of one of her  fingers.  Patient is seen at [facility] for anti-coagulation.  Warfarin held PTA (last dose 5/16) in preparation for laprascopic nissen and hiatus hernia repair performed 5/20.  Pt received SQ heparin POD1, then transitioned to Lovenox bridge with resumption of warfarin per [facility] anti-coagulation clinic and Dr. Cyndie Chime recommendations.    POD3: Currently on Coumadin home dose of 6 mg and Lovenox 1.5 mg/kg daily    Weight: 78.9 kg  SCr 0.95, CrCl~61 ml/min  CBC stable  INR 1.00 - received 1st dose of Coumadin last night so full effect will not be seen until 24-48 hours later.   Goal of Therapy:  INR 2-3 Anti-Xa level 0.6-1.2 units/ml 4hrs after LMWH dose given Monitor platelets by anticoagulation protocol: Yes   Plan:  1.  Continue Lovenox 120 mg (~1.5 mg/kg) SQ q24h. 2.  Repeat Warfarin 6 mg po x 1 at noon as pt may be discharged today 3.  F/u INR daily and CBC q72h.  [employer]  Belal Scallon, PharmD 12/15/2012 7:24 AM  Pager: [phone]

## 2012-12-15 NOTE — Progress Notes (Signed)
Recuperating well post op day 3 Wounds look good Ambulating; had a bowel movement Back on full dose coumadin and lovenox. Rec: Continue lovenox 120 mg SQ daily until Tuesday 5/27:  We will check PT/INR in our office that day; continue coumadin 6 mg PO QD.  Thanks

## 2012-12-15 NOTE — Discharge Summary (Signed)
Physician Discharge Summary  Patient ID: [name] MRN: [id number] DOB/AGE: October 31, 1945 67 y.o.  Admit date: 12/12/2012 Discharge date: 12/15/2012  Admission Diagnoses:  Failed prior open Nissen   Discharge Diagnoses:  same  Active Problems:   * No active hospital problems. *   Surgery:  Laparoscopic takedown of failed Nissen and repair of hiatus hernia  Discharged Condition: improved  Hospital Course:   Patient had surgery.  Was kept on heparin and observed for antiphospholipid disorder.    Consults: Riley Churches  Significant Diagnostic Studies: UGI    Discharge Exam: Blood pressure 117/74, pulse 72, temperature 98.8 F (37.1 C), temperature source Oral, resp. rate 18, height 5\' 5"  (1.651 m), weight 173 lb 15.1 oz (78.9 kg), SpO2 92.00%. Incisions bland;  No pain  Disposition: 01-Home or Self Care  Discharge Orders   Future Appointments Provider Department Dept Phone   12/21/2012 1:00 PM Beverely Pace [facility] [phone]   12/21/2012 1:15 PM [facility] [place] [facility] [phone]   01/03/2013 2:10 PM Valarie Merino, MD [facility], [place] [phone]   04/17/2013 10:30 AM Delcie Roch [facility] MEDICAL [facility]COL[facility] [phone]   04/17/2013 11:00 AM Levert Feinstein, MD [facility] MEDICAL ONCOLOGY [phone]   Future Orders Complete By Expires     Diet - low sodium heart healthy  As directed     Discharge instructions  As directed     Comments:      Follow anticoagulation orders per Dr. Cyndie Chime    Increase activity slowly  As directed     No wound care  As directed         Medication List    STOP taking these medications       doxycycline 100 MG tablet  Commonly known as:  VIBRA-TABS      TAKE these medications       ALPRAZolam 1 MG tablet  Commonly known as:  XANAX  Take 0.5 mg by mouth 3 (three) times daily.     AMBIEN  10 MG tablet  Generic drug:  zolpidem  Take 5-10 mg by mouth at bedtime.     aspirin 81 MG tablet  Take 81 mg by mouth daily.     buPROPion 200 MG 12 hr tablet  Commonly known as:  WELLBUTRIN SR  Take 200 mg by mouth 2 (two) times daily.     CALCIUM + D PO  Take 600 mg by mouth. Taking 1 Tablet Daily  Tablet contains Magnesium and Zinc.     HYDROcodone-acetaminophen 5-325 MG per tablet  Commonly known as:  NORCO/VICODIN  Take 1 tablet by mouth every 6 (six) hours as needed for pain.     levothyroxine 112 MCG tablet  Commonly known as:  SYNTHROID, LEVOTHROID  Take 112 mcg by mouth daily before breakfast.     metoprolol succinate 25 MG 24 hr tablet  Commonly known as:  TOPROL-XL  Take 25 mg by mouth daily.     omeprazole 20 MG capsule  Commonly known as:  PRILOSEC  Take 20 mg by mouth 2 (two) times daily.     oxyCODONE-acetaminophen 5-325 MG/5ML solution  Commonly known as:  ROXICET  Take 5-10 mLs by mouth every 4 (four) hours as needed.     pravastatin 40 MG tablet  Commonly known as:  PRAVACHOL  Take 40 mg by mouth at bedtime.     pseudoephedrine 120  MG 12 hr tablet  Commonly known as:  SUDAFED  Take 120 mg by mouth as needed.     warfarin 6 MG tablet  Commonly known as:  COUMADIN  Take 6 mg by mouth daily.           Follow-up Information   Follow up with Levert Feinstein, MD.   Contact information:   [address] [place] [address] [phone]       Follow up with Valarie Merino, MD.   Contact information:   [address] [place] [address] [phone]       Signed: Valarie Merino 12/15/2012, 9:47 AM

## 2012-12-19 ENCOUNTER — Other Ambulatory Visit (HOSPITAL_BASED_OUTPATIENT_CLINIC_OR_DEPARTMENT_OTHER): Payer: Medicare Other

## 2012-12-19 ENCOUNTER — Ambulatory Visit: Payer: Medicare Other | Admitting: Pharmacist

## 2012-12-19 DIAGNOSIS — I82409 Acute embolism and thrombosis of unspecified deep veins of unspecified lower extremity: Secondary | ICD-10-CM

## 2012-12-19 DIAGNOSIS — D6861 Antiphospholipid syndrome: Secondary | ICD-10-CM

## 2012-12-19 DIAGNOSIS — R760 Raised antibody titer: Secondary | ICD-10-CM

## 2012-12-19 DIAGNOSIS — R894 Abnormal immunological findings in specimens from other organs, systems and tissues: Secondary | ICD-10-CM

## 2012-12-19 DIAGNOSIS — Z862 Personal history of diseases of the blood and blood-forming organs and certain disorders involving the immune mechanism: Secondary | ICD-10-CM

## 2012-12-19 DIAGNOSIS — I742 Embolism and thrombosis of arteries of the upper extremities: Secondary | ICD-10-CM

## 2012-12-19 LAB — CBC WITH DIFFERENTIAL/PLATELET
BASO%: 0.7 % (ref 0.0–2.0)
Basophils Absolute: 0 10*3/uL (ref 0.0–0.1)
EOS%: 4.1 % (ref 0.0–7.0)
Eosinophils Absolute: 0.2 10*3/uL (ref 0.0–0.5)
HCT: 37.5 % (ref 34.8–46.6)
HGB: 12.4 g/dL (ref 11.6–15.9)
LYMPH%: 34.3 % (ref 14.0–49.7)
MCH: 30.8 pg (ref 25.1–34.0)
MCHC: 33.1 g/dL (ref 31.5–36.0)
MCV: 93.1 fL (ref 79.5–101.0)
MONO#: 0.3 10*3/uL (ref 0.1–0.9)
MONO%: 5.7 % (ref 0.0–14.0)
NEUT#: 3.1 10*3/uL (ref 1.5–6.5)
NEUT%: 55.2 % (ref 38.4–76.8)
Platelets: 292 10*3/uL (ref 145–400)
RBC: 4.03 10*6/uL (ref 3.70–5.45)
RDW: 13.2 % (ref 11.2–14.5)
WBC: 5.6 10*3/uL (ref 3.9–10.3)
lymph#: 1.9 10*3/uL (ref 0.9–3.3)

## 2012-12-19 LAB — POCT INR: INR: 1.7

## 2012-12-19 LAB — PROTIME-INR
INR: 1.7 — ABNORMAL LOW (ref 2.00–3.50)
Protime: 20.4 Seconds — ABNORMAL HIGH (ref 10.6–13.4)

## 2012-12-19 NOTE — Progress Notes (Signed)
INR below, patient started back on coumadin last week on Thursday 5/22 and lovenox. Patient reports no bleeding or bruising and has missed no doses of coumadin. Patient is feeling well overall after procedure last week but is a little fatigued. Plan is to Continue lovenox 120 mg daily. Take 2 tablets (8mg ) today then continue 6 mg daily. Recheck INR this Friday at 1:45pm lab and 2pm coumadin clinic

## 2012-12-19 NOTE — Patient Instructions (Signed)
INR below goal today  Continue coumadin 6 mg daily and lovenox 120 mg   Recheck INR on Friday 5/30 at 1:45pm lab 2pm coumadin clinic

## 2012-12-20 ENCOUNTER — Telehealth (INDEPENDENT_AMBULATORY_CARE_PROVIDER_SITE_OTHER): Payer: Self-pay | Admitting: General Surgery

## 2012-12-20 NOTE — Telephone Encounter (Signed)
Patient called wanting clarification on what type of protein drinks she is supposed to be drinking. She states she tried [employer] and it made her sick. I advised she could try a different brand or she could buy protein powder and make her own shakes. She will try this and call with any problems.

## 2012-12-21 ENCOUNTER — Other Ambulatory Visit: Payer: Medicare Other | Admitting: Lab

## 2012-12-21 ENCOUNTER — Ambulatory Visit: Payer: Medicare Other

## 2012-12-21 ENCOUNTER — Telehealth (INDEPENDENT_AMBULATORY_CARE_PROVIDER_SITE_OTHER): Payer: Self-pay

## 2012-12-21 NOTE — Telephone Encounter (Signed)
Megan notified verbally of request in epic.

## 2012-12-21 NOTE — Telephone Encounter (Signed)
Pt called requesting refill Roxicet 5/325 5-57ml q 4hr, bottle. No fever or vomiting. Tolerating liquid diet. Pt states still has discomfort in epigastric area and request to pick up written refill at our office. Pt advised I will send request to Dr Daphine Deutscher and his assistant.

## 2012-12-22 ENCOUNTER — Ambulatory Visit (HOSPITAL_BASED_OUTPATIENT_CLINIC_OR_DEPARTMENT_OTHER): Payer: Medicare Other | Admitting: Pharmacist

## 2012-12-22 ENCOUNTER — Other Ambulatory Visit (HOSPITAL_BASED_OUTPATIENT_CLINIC_OR_DEPARTMENT_OTHER): Payer: Medicare Other

## 2012-12-22 DIAGNOSIS — D6861 Antiphospholipid syndrome: Secondary | ICD-10-CM

## 2012-12-22 DIAGNOSIS — R894 Abnormal immunological findings in specimens from other organs, systems and tissues: Secondary | ICD-10-CM

## 2012-12-22 DIAGNOSIS — D6859 Other primary thrombophilia: Secondary | ICD-10-CM

## 2012-12-22 DIAGNOSIS — I742 Embolism and thrombosis of arteries of the upper extremities: Secondary | ICD-10-CM

## 2012-12-22 DIAGNOSIS — Z862 Personal history of diseases of the blood and blood-forming organs and certain disorders involving the immune mechanism: Secondary | ICD-10-CM

## 2012-12-22 DIAGNOSIS — R760 Raised antibody titer: Secondary | ICD-10-CM

## 2012-12-22 LAB — CBC WITH DIFFERENTIAL/PLATELET
BASO%: 1 % (ref 0.0–2.0)
Basophils Absolute: 0.1 10*3/uL (ref 0.0–0.1)
EOS%: 3.2 % (ref 0.0–7.0)
Eosinophils Absolute: 0.2 10*3/uL (ref 0.0–0.5)
HCT: 39.5 % (ref 34.8–46.6)
HGB: 13.4 g/dL (ref 11.6–15.9)
LYMPH%: 31.5 % (ref 14.0–49.7)
MCH: 30.8 pg (ref 25.1–34.0)
MCHC: 33.9 g/dL (ref 31.5–36.0)
MCV: 90.8 fL (ref 79.5–101.0)
MONO#: 0.6 10*3/uL (ref 0.1–0.9)
MONO%: 7.9 % (ref 0.0–14.0)
NEUT#: 4.1 10*3/uL (ref 1.5–6.5)
NEUT%: 56.4 % (ref 38.4–76.8)
Platelets: ADEQUATE 10*3/uL (ref 145–400)
RBC: 4.35 10*6/uL (ref 3.70–5.45)
RDW: 13 % (ref 11.2–14.5)
WBC: 7.3 10*3/uL (ref 3.9–10.3)
lymph#: 2.3 10*3/uL (ref 0.9–3.3)

## 2012-12-22 LAB — PROTIME-INR
INR: 2.2 (ref 2.00–3.50)
Protime: 26.4 Seconds — ABNORMAL HIGH (ref 10.6–13.4)

## 2012-12-22 LAB — POCT INR: INR: 2.2

## 2012-12-22 NOTE — Progress Notes (Signed)
INR at goal today after increasing slightly. No bruising or bleeding noted. No missed doses. Patient is feeling tired and sore from procedure. But is recovering. Patient can stop lovenox shots. And plan is to continue 6 mg daily except for 8 mg on Mondays. Return in 7-10 days on 01/02/13 at 2pm for lab and 215pm for coumadin clinic

## 2012-12-22 NOTE — Patient Instructions (Addendum)
INR at goal. Randie Heinz job!  Stop lovenox shots  Continue 6 mg daily except for 8 mg on Monday. Recheck INR in about a week 01/02/13 at 2pm lab and 2:15pm coumadin clinic  Feel better soon! Lots of rest!

## 2013-01-02 ENCOUNTER — Ambulatory Visit (HOSPITAL_BASED_OUTPATIENT_CLINIC_OR_DEPARTMENT_OTHER): Payer: Medicare Other | Admitting: Pharmacist

## 2013-01-02 ENCOUNTER — Other Ambulatory Visit: Payer: Medicare Other | Admitting: Lab

## 2013-01-02 DIAGNOSIS — D6859 Other primary thrombophilia: Secondary | ICD-10-CM

## 2013-01-02 DIAGNOSIS — R894 Abnormal immunological findings in specimens from other organs, systems and tissues: Secondary | ICD-10-CM

## 2013-01-02 DIAGNOSIS — I82409 Acute embolism and thrombosis of unspecified deep veins of unspecified lower extremity: Secondary | ICD-10-CM

## 2013-01-02 DIAGNOSIS — R760 Raised antibody titer: Secondary | ICD-10-CM

## 2013-01-02 DIAGNOSIS — D6861 Antiphospholipid syndrome: Secondary | ICD-10-CM

## 2013-01-02 LAB — PROTIME-INR
INR: 3.4 (ref 2.00–3.50)
Protime: 40.8 Seconds — ABNORMAL HIGH (ref 10.6–13.4)

## 2013-01-02 LAB — POCT INR: INR: 3.4

## 2013-01-02 NOTE — Progress Notes (Signed)
INR slightly above goal today. No problems to report regarding anticoagulation. No changes in medications. Pt remains on liquid diet. She has f/u with Dr. Daphine Deutscher on 01/03/13 and believes he may advance her diet at that time. Pt has been stable on 6mg  daily. Her INR is very sensitive to holding doses. Will decrease coumadin to 3mg  today only.  On 01/03/13, decrease to coumadin 6mg  daily.  This was her usual dose prior to surgery. Recheck INR in 10 days on 01/12/13;  Lab at 1pm and coumadin clinic at 1:15pm.  Sample provided: Coumadin 1mg  tablet (x 1 tablet)                              [id number], Exp: 08/2013

## 2013-01-02 NOTE — Patient Instructions (Signed)
Take 3mg  today only.  On 01/03/13, decrease to coumadin 6mg  daily.   Recheck INR in 10 days on 01/12/13;  Lab at 1pm and coumadin clinic at 1:15pm.

## 2013-01-03 ENCOUNTER — Ambulatory Visit (INDEPENDENT_AMBULATORY_CARE_PROVIDER_SITE_OTHER): Payer: Medicare Other | Admitting: Surgery

## 2013-01-03 ENCOUNTER — Encounter (INDEPENDENT_AMBULATORY_CARE_PROVIDER_SITE_OTHER): Payer: Self-pay | Admitting: Surgery

## 2013-01-03 VITALS — BP 130/82 | HR 68 | Temp 98.3°F | Resp 18 | Ht 64.5 in | Wt 165.0 lb

## 2013-01-03 DIAGNOSIS — Z9889 Other specified postprocedural states: Secondary | ICD-10-CM

## 2013-01-03 DIAGNOSIS — Z09 Encounter for follow-up examination after completed treatment for conditions other than malignant neoplasm: Secondary | ICD-10-CM

## 2013-01-03 NOTE — Patient Instructions (Signed)
Nissen Fundoplication Care After Please read the instructions outlined below and refer to this sheet for the next few weeks. These discharge instructions provide you with general information on caring for yourself after you leave the hospital. Your doctor may also give you specific instructions. While your treatment has been planned according to the most current medical practices available, unavoidable complications sometimes happen. If you have any problems or questions after discharge, please call your doctor. ACTIVITY  Take frequent rest periods throughout the day.  Take frequent walks throughout the day. This will help to prevent blood clots.  Continue to do your coughing and deep breathing exercises once you get home. This will help to prevent pneumonia.  No strenuous activities such as heavy lifting, pushing or pulling until after your follow-up visit with your doctor. Do not lift anything heavier than 10 pounds.  Talk with your caregiver about when you may return to work and your exercise routine.  You may shower 2 days after surgery. Pat incisions dry. Do not rub incisions with washcloth or towel.  Do not drive while taking prescription pain medication. NUTRITION  Continue with a liquid diet, or the diet you were directed to take, until your first follow-up visit with your surgeon.  Drink fluids (6-8 glasses a day).  Call your caregiver for persistent nausea (feeling sick to your stomach), vomiting, bloating or difficulty swallowing. ELIMINATION It is very important not to strain during bowel movements. If constipation should occur, you may:  Take a mild laxative (such as Milk of Magnesia).  Add fruit and bran to your diet.  Drink more fluids.  Call your caregiver if constipation is not relieved. FEVER If you feel feverish or have shaking chills, take your temperature. If it is 102 F (38.9 C) or above, call your caregiver. The fever may mean there is an infection. PAIN  CONTROL  If a prescription was given for a pain reliever, please follow your caregiver's directions.  Only take over-the-counter or prescription medicines for pain, discomfort, or fever as directed by your caregiver.  If the pain is not relieved by your medicine, becomes worse, or you have difficulty breathing, call your doctor. INCISION  It is normal for your cuts (incisions) from surgery to have a small amount of drainage for the first 1-2 days. Once the drainage has stopped, leave your incision(s) open to air.  Check your incision(s) and surrounding area daily for any redness, swelling, increased drainage or bleeding. If any of these are present or if the wound edges start to separate, call your doctor.  If you have small adhesive strips in place, they will peel and fall off. (If these strips are covered with a clear bandage, your doctor will tell you when to remove them.)  If you have staples, your caregiver will remove them at the follow-up appointment. Document Released: 03/04/2004 Document Revised: 10/04/2011 Document Reviewed: 06/08/2007 Exit[employer] Patient Information 2014 [employer].  

## 2013-01-03 NOTE — Progress Notes (Signed)
[name] 67 y.o.  Body mass index is 27.9 kg/(m^2).  Patient Active Problem List   Diagnosis Date Noted  . GERD - post failed open Nissen 11/23/2012  . Dysphagia, unspecified 09/28/2012  . S/P Open Nissen 1998 Dr. Mardella Layman 08/18/2012  . 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 Lupus anticoagulant positive 06/01/2011  . Chest pain   . Hx-TIA (transient ischemic attack)   . Hypothyroidism   . Breast cancer   . History of DVT (deep vein thrombosis)   . History of antiphospholipid antibody syndrome     Allergies  Allergen Reactions  . Vitamin K Anaphylaxis and Rash    "kills me" iv  . Benadryl (Diphenhydramine Hcl)     Rash  . Crestor (Rosuvastatin Calcium)     Muscle Aches  . Daypro (Oxaprozin)     Rash  . Doxycycline     Tongue burning, thrush  . Hornet Venom     Anaphylaxis Shock  . Levofloxacin   . Levofloxacin     Rash  . Zocor (Simvastatin)     Muscle Aches  . Sulfonamide Derivatives Rash    Past Surgical History  Procedure Laterality Date  . Cardiac catheterization  07/12/1997    NORMAL LEFT VENTRICULAR FUNCTION WITH EF AT LEAST 70-75%  . Thyroidectomy  in 20's  . Left oophorectomy  1982  . Right oophorectomy  1980  . Abdominal hysterectomy    . Esophageal manometry N/A 11/06/2012    Procedure: ESOPHAGEAL MANOMETRY (EM);  Surgeon: Valarie Merino, MD;  Location: [facility];  Service: General;  Laterality: N/A;  . Esophagogastroduodenoscopy endoscopy    . Tonsillectomy  67 years old  . Bunionectomy Bilateral 30 years ago  . Knee arthroscopy Right   . Breast surgery Right     x3  . Laparoscopic nissen fundoplication N/A 12/12/2012    Procedure: LAPAROSCOPIC TAKEDOWN  PERI-HIATAL HERNIA    REPAIR;  Surgeon: Valarie Merino, MD;  Location: [facility]S;  Service: General;  Laterality: N/A;  . Upper gi endoscopy N/A 12/12/2012    Procedure: UPPER GI ENDOSCOPY;  Surgeon: Valarie Merino, MD;  Location:  [facility] ORS;  Service: General;  Laterality: N/A;   KAPLAN,KRISTEN, PA-C No diagnosis found.  Doing well after redo lap Nissen PROCEDURE: Laparoscopic enterolysis and delineation of hiatal hernia,  upper endoscopy by Dr. Ezzard Standing and posterior repair of hiatus. Takedown  wrap, not trying to redo wrap since we could not insert a bougie and  there is evidence of high pressure in the distal esophagus. Incisions OK.  Will see back in 2 months.   [name], MD, [facility] [phone] beeper [phone]  01/03/2013 2:25 PM

## 2013-01-12 ENCOUNTER — Other Ambulatory Visit (HOSPITAL_BASED_OUTPATIENT_CLINIC_OR_DEPARTMENT_OTHER): Payer: Medicare Other | Admitting: Lab

## 2013-01-12 ENCOUNTER — Ambulatory Visit: Payer: Medicare Other | Admitting: Pharmacist

## 2013-01-12 DIAGNOSIS — D6859 Other primary thrombophilia: Secondary | ICD-10-CM

## 2013-01-12 DIAGNOSIS — I82409 Acute embolism and thrombosis of unspecified deep veins of unspecified lower extremity: Secondary | ICD-10-CM

## 2013-01-12 DIAGNOSIS — D6861 Antiphospholipid syndrome: Secondary | ICD-10-CM

## 2013-01-12 DIAGNOSIS — R894 Abnormal immunological findings in specimens from other organs, systems and tissues: Secondary | ICD-10-CM

## 2013-01-12 DIAGNOSIS — R760 Raised antibody titer: Secondary | ICD-10-CM

## 2013-01-12 LAB — POCT INR: INR: 2.9

## 2013-01-12 LAB — PROTIME-INR
INR: 2.9 (ref 2.00–3.50)
Protime: 34.8 Seconds — ABNORMAL HIGH (ref 10.6–13.4)

## 2013-01-12 NOTE — Patient Instructions (Signed)
Continue coumadin 6mg  daily.  Recheck INR on 02/02/13;  Lab at 1:30pm and coumadin clinic at 1:45pm.

## 2013-01-12 NOTE — Progress Notes (Signed)
Pt seen in clinic today. INR=2.9 on 6mg  daily after a 3mg  dose on day of last visit Pt is healing well from procedure and starting to increase her solid PO intake She is anticipating more "greens" in her diet Continue coumadin 6mg  daily.   Recheck INR on 02/02/13;  Lab at 1:30pm and coumadin clinic at 1:45pm.

## 2013-02-02 ENCOUNTER — Ambulatory Visit (HOSPITAL_BASED_OUTPATIENT_CLINIC_OR_DEPARTMENT_OTHER): Payer: Medicare Other | Admitting: Pharmacist

## 2013-02-02 ENCOUNTER — Other Ambulatory Visit: Payer: Medicare Other | Admitting: Lab

## 2013-02-02 DIAGNOSIS — D6861 Antiphospholipid syndrome: Secondary | ICD-10-CM

## 2013-02-02 DIAGNOSIS — R760 Raised antibody titer: Secondary | ICD-10-CM

## 2013-02-02 DIAGNOSIS — R894 Abnormal immunological findings in specimens from other organs, systems and tissues: Secondary | ICD-10-CM

## 2013-02-02 DIAGNOSIS — I82409 Acute embolism and thrombosis of unspecified deep veins of unspecified lower extremity: Secondary | ICD-10-CM

## 2013-02-02 DIAGNOSIS — D6859 Other primary thrombophilia: Secondary | ICD-10-CM

## 2013-02-02 LAB — PROTIME-INR
INR: 2.2 (ref 2.00–3.50)
Protime: 26.4 Seconds — ABNORMAL HIGH (ref 10.6–13.4)

## 2013-02-02 LAB — POCT INR: INR: 2.2

## 2013-02-02 NOTE — Patient Instructions (Signed)
Continue coumadin 6mg  daily.   Recheck INR on 02/26/13; lab at 1:30pm and coumadin clinic at 1:45pm.

## 2013-02-02 NOTE — Progress Notes (Signed)
INR within goal today. No problems or concerns regarding anticoagulation. No changes in diet, medications, etc. No missed doses. Continue coumadin 6mg  daily. Recheck INR in 4 weeks.  Recheck INR on 02/26/13; lab at 1:30pm and coumadin clinic at 1:45pm.

## 2013-02-26 ENCOUNTER — Other Ambulatory Visit (HOSPITAL_BASED_OUTPATIENT_CLINIC_OR_DEPARTMENT_OTHER): Payer: Medicare Other | Admitting: Lab

## 2013-02-26 ENCOUNTER — Ambulatory Visit (HOSPITAL_BASED_OUTPATIENT_CLINIC_OR_DEPARTMENT_OTHER): Payer: Medicare Other | Admitting: Pharmacist

## 2013-02-26 DIAGNOSIS — D6859 Other primary thrombophilia: Secondary | ICD-10-CM

## 2013-02-26 DIAGNOSIS — D6861 Antiphospholipid syndrome: Secondary | ICD-10-CM

## 2013-02-26 DIAGNOSIS — R894 Abnormal immunological findings in specimens from other organs, systems and tissues: Secondary | ICD-10-CM

## 2013-02-26 DIAGNOSIS — I82409 Acute embolism and thrombosis of unspecified deep veins of unspecified lower extremity: Secondary | ICD-10-CM

## 2013-02-26 DIAGNOSIS — R760 Raised antibody titer: Secondary | ICD-10-CM

## 2013-02-26 LAB — PROTIME-INR
INR: 2.9 (ref 2.00–3.50)
Protime: 34.8 Seconds — ABNORMAL HIGH (ref 10.6–13.4)

## 2013-02-26 LAB — POCT INR: INR: 2.9

## 2013-02-26 NOTE — Progress Notes (Signed)
Pt seen in clinic today. INR=2.9 on 6mg  daily No changes to report.  She may be increasing her PPI after seeing Dr. Daphine Deutscher next week. Continue coumadin 6mg  daily.   Recheck INR on 03/28/13;  Lab at 1:30pm and coumadin clinic at 1:45pm

## 2013-02-26 NOTE — Patient Instructions (Signed)
Continue coumadin 6mg  daily.  Recheck INR on 03/28/13;  Lab at 1:30pm and coumadin clinic at 1:45pm

## 2013-02-28 ENCOUNTER — Other Ambulatory Visit: Payer: Self-pay

## 2013-02-28 ENCOUNTER — Telehealth: Payer: Self-pay | Admitting: Pharmacist

## 2013-02-28 DIAGNOSIS — K219 Gastro-esophageal reflux disease without esophagitis: Secondary | ICD-10-CM

## 2013-02-28 NOTE — Telephone Encounter (Addendum)
Pt called to let us know that she was stung by black hornets on her back and shoulders. She went to her PCP and was given a steroid injection and a prescription for Clindamycin 300mg  po TID x 7 days. Discussed that Clindamycin should not effect her INR and she will only be taking the antibiotic for 7 days. The steroid injection may/may not cause her INR to transiently increase. INR on 02/26/13 = 2.9. She will have her INR rechecked in 4 weeks.

## 2013-03-08 ENCOUNTER — Encounter (INDEPENDENT_AMBULATORY_CARE_PROVIDER_SITE_OTHER): Payer: Self-pay | Admitting: Surgery

## 2013-03-08 ENCOUNTER — Ambulatory Visit (INDEPENDENT_AMBULATORY_CARE_PROVIDER_SITE_OTHER): Payer: Medicare Other | Admitting: Surgery

## 2013-03-08 VITALS — BP 130/76 | HR 64 | Temp 98.3°F | Resp 14 | Ht 64.5 in | Wt 164.6 lb

## 2013-03-08 DIAGNOSIS — K219 Gastro-esophageal reflux disease without esophagitis: Secondary | ICD-10-CM

## 2013-03-08 MED ORDER — METOCLOPRAMIDE HCL 5 MG PO TABS: 5.0000 mg | ORAL_TABLET | Freq: Four times a day (QID) | ORAL | Status: DC

## 2013-03-08 NOTE — Patient Instructions (Signed)
Metoclopramide tablets What is this medicine? METOCLOPRAMIDE (met oh kloe PRA mide) is used to treat the symptoms of gastroesophageal reflux disease (GERD) like heartburn. It is also used to treat people with slow emptying of the stomach and intestinal tract. This medicine may be used for other purposes; ask your health care provider or pharmacist if you have questions. What should I tell my health care provider before I take this medicine? They need to know if you have any of these conditions: -breast cancer -depression -diabetes -heart failure -high blood pressure -kidney disease -liver disease -Parkinson's disease or a movement disorder -pheochromocytoma -seizures -stomach obstruction, bleeding, or perforation -an unusual or allergic reaction to metoclopramide, procainamide, sulfites, other medicines, foods, dyes, or preservatives -pregnant or trying to get pregnant -breast-feeding How should I use this medicine? Take this medicine by mouth with a glass of water. Follow the directions on the prescription label. Take this medicine on an empty stomach, about 30 minutes before eating. Take your doses at regular intervals. Do not take your medicine more often than directed. Do not stop taking except on the advice of your doctor or health care professional. A special MedGuide will be given to you by the pharmacist with each prescription and refill. Be sure to read this information carefully each time. Talk to your pediatrician regarding the use of this medicine in children. Special care may be needed. Overdosage: If you think you have taken too much of this medicine contact a poison control center or emergency room at once. NOTE: This medicine is only for you. Do not share this medicine with others. What if I miss a dose? If you miss a dose, take it as soon as you can. If it is almost time for your next dose, take only that dose. Do not take double or extra doses. What may interact with  this medicine? -acetaminophen -cyclosporine -digoxin -medicines for blood pressure -medicines for diabetes, including insulin -medicines for hay fever and other allergies -medicines for depression, especially an Monoamine Oxidase Inhibitor (MAOI) -medicines for Parkinson's disease, like levodopa -medicines for sleep or for pain -tetracycline This list may not describe all possible interactions. Give your health care provider a list of all the medicines, herbs, non-prescription drugs, or dietary supplements you use. Also tell them if you smoke, drink alcohol, or use illegal drugs. Some items may interact with your medicine. What should I watch for while using this medicine? It may take a few weeks for your stomach condition to start to get better. However, do not take this medicine for longer than 12 weeks. The longer you take this medicine, and the more you take it, the greater your chances are of developing serious side effects. If you are an elderly patient, a female patient, or you have diabetes, you may be at an increased risk for side effects from this medicine. Contact your doctor immediately if you start having movements you cannot control such as lip smacking, rapid movements of the tongue, involuntary or uncontrollable movements of the eyes, head, arms and legs, or muscle twitches and spasms. Patients and their families should watch out for worsening depression or thoughts of suicide. Also watch out for any sudden or severe changes in feelings such as feeling anxious, agitated, panicky, irritable, hostile, aggressive, impulsive, severely restless, overly excited and hyperactive, or not being able to sleep. If this happens, especially at the beginning of treatment or after a change in dose, call your doctor. Do not treat yourself for high fever. Ask   your doctor or health care professional for advice. You may get drowsy or dizzy. Do not drive, use machinery, or do anything that needs mental  alertness until you know how this drug affects you. Do not stand or sit up quickly, especially if you are an older patient. This reduces the risk of dizzy or fainting spells. Alcohol can make you more drowsy and dizzy. Avoid alcoholic drinks. What side effects may I notice from receiving this medicine? Side effects that you should report to your doctor or health care professional as soon as possible: -allergic reactions like skin rash, itching or hives, swelling of the face, lips, or tongue -abnormal production of milk in females -breast enlargement in both males and females -change in the way you walk -difficulty moving, speaking or swallowing -drooling, lip smacking, or rapid movements of the tongue -excessive sweating -fever -involuntary or uncontrollable movements of the eyes, head, arms and legs -irregular heartbeat or palpitations -muscle twitches and spasms -unusually weak or tired Side effects that usually do not require medical attention (report to your doctor or health care professional if they continue or are bothersome): -change in sex drive or performance -depressed mood -diarrhea -difficulty sleeping -headache -menstrual changes -restless or nervous This list may not describe all possible side effects. Call your doctor for medical advice about side effects. You may report side effects to FDA at 1-800-FDA-1088. Where should I keep my medicine? Keep out of the reach of children. Store at room temperature between 20 and 25 degrees C (68 and 77 degrees F). Protect from light. Keep container tightly closed. Throw away any unused medicine after the expiration date. NOTE: This sheet is a summary. It may not cover all possible information. If you have questions about this medicine, talk to your doctor, pharmacist, or health care provider.  2013, Elsevier/Gold Standard. (11/09/2011 1:04:38 PM)  

## 2013-03-08 NOTE — Progress Notes (Signed)
[name] First 67 y.o.  Body mass index is 27.83 kg/(m^2).  Patient Active Problem List   Diagnosis Date Noted  . GERD - post failed open Nissen 11/23/2012  . Dysphagia, unspecified 09/28/2012  . S/P Open Nissen 1998 Dr. Mardella Layman 08/18/2012  . 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 Lupus anticoagulant positive 06/01/2011  . Chest pain   . Hx-TIA (transient ischemic attack)   . Hypothyroidism   . Breast cancer   . History of DVT (deep vein thrombosis)   . History of antiphospholipid antibody syndrome     Allergies  Allergen Reactions  . Vitamin K Anaphylaxis and Rash    "kills me" iv  . Benadryl [Diphenhydramine Hcl]     Rash  . Crestor [Rosuvastatin Calcium]     Muscle Aches  . Daypro [Oxaprozin]     Rash  . Doxycycline     Tongue burning, thrush  . Hornet Venom     Anaphylaxis Shock  . Levofloxacin   . Levofloxacin     Rash  . Zocor [Simvastatin]     Muscle Aches  . Sulfonamide Derivatives Rash    Past Surgical History  Procedure Laterality Date  . Cardiac catheterization  07/12/1997    NORMAL LEFT VENTRICULAR FUNCTION WITH EF AT LEAST 70-75%  . Thyroidectomy  in 20's  . Left oophorectomy  1982  . Right oophorectomy  1980  . Abdominal hysterectomy    . Esophageal manometry N/A 11/06/2012    Procedure: ESOPHAGEAL MANOMETRY (EM);  Surgeon: Valarie Merino, MD;  Location: [facility];  Service: General;  Laterality: N/A;  . Esophagogastroduodenoscopy endoscopy    . Tonsillectomy  67 years old  . Bunionectomy Bilateral 30 years ago  . Knee arthroscopy Right   . Breast surgery Right     x3  . Laparoscopic nissen fundoplication N/A 12/12/2012    Procedure: LAPAROSCOPIC TAKEDOWN  PERI-HIATAL HERNIA    REPAIR;  Surgeon: Valarie Merino, MD;  Location: [facility];  Service: General;  Laterality: N/A;  . Upper gi endoscopy N/A 12/12/2012    Procedure: UPPER GI ENDOSCOPY;  Surgeon: Valarie Merino, MD;   Location: [facility];  Service: General;  Laterality: N/A;   KAPLAN,KRISTEN, PA-C 1. GERD (gastroesophageal reflux disease)     Ms. Tolles still has some hoarseness. She was put back on omeprazole by Dr. Loreta Ave. In reviewing her note I repaired her hiatal hernia but did not perform a redo Nissen fundoplication because on endoscopy she had a lot of old food that had failed to clear.  Since motility seems to be a primary issue here I am going to had Reglan 5 mg 30 minutes a.c. And at bedtime. I'll see her back in 5 weeks. [name], MD, [facility] [phone] beeper [phone]  03/08/2013 10:15 AM

## 2013-03-12 ENCOUNTER — Telehealth: Payer: Self-pay | Admitting: Pharmacist

## 2013-03-12 NOTE — Telephone Encounter (Signed)
Pt called pharmacy to let us know she is off Omeprazole (stopped 03/09/13) & now is on Reglan (started 03/09/13). Dr. Daphine Deutscher (surgeon) made this change.  [name] still has trouble w/ food getting stuck in her esophagus.  She will try this for 4-5 weeks. By stopping omeprazole, there is potential that Nolan's INR may decrease.  She has been on omeprazole for years so it is difficult to determine exactly how her INR responded when she started omeprazole. She is already scheduled for lab/Coumadin clinic visit on 03/28/13.  I have recommended she keep this appt & we can see how the med change affected her INR. Ebony Hail, Pharm.D., CPP 03/12/2013@12 :02 PM

## 2013-03-12 NOTE — Addendum Note (Signed)
Addended by: Amaryllis Dyke on: 03/12/2013 12:03 PM   Modules accepted: Orders

## 2013-03-19 ENCOUNTER — Telehealth (INDEPENDENT_AMBULATORY_CARE_PROVIDER_SITE_OTHER): Payer: Self-pay

## 2013-03-19 NOTE — Telephone Encounter (Signed)
Patient calling into office to report that she had unpleasant side effects from taking Reglan and took the last dose last Thursday.  Patient reports that she's only taking Omeprazole.  Patient reports that she's doing well at this point and will call our office if her symptoms return or worsen.

## 2013-03-28 ENCOUNTER — Ambulatory Visit (HOSPITAL_BASED_OUTPATIENT_CLINIC_OR_DEPARTMENT_OTHER): Payer: Medicare Other | Admitting: Pharmacist

## 2013-03-28 ENCOUNTER — Other Ambulatory Visit (HOSPITAL_BASED_OUTPATIENT_CLINIC_OR_DEPARTMENT_OTHER): Payer: Medicare Other

## 2013-03-28 DIAGNOSIS — D6861 Antiphospholipid syndrome: Secondary | ICD-10-CM

## 2013-03-28 DIAGNOSIS — I82409 Acute embolism and thrombosis of unspecified deep veins of unspecified lower extremity: Secondary | ICD-10-CM

## 2013-03-28 DIAGNOSIS — R760 Raised antibody titer: Secondary | ICD-10-CM

## 2013-03-28 DIAGNOSIS — R894 Abnormal immunological findings in specimens from other organs, systems and tissues: Secondary | ICD-10-CM

## 2013-03-28 DIAGNOSIS — D6859 Other primary thrombophilia: Secondary | ICD-10-CM

## 2013-03-28 LAB — PROTIME-INR
INR: 1.9 — ABNORMAL LOW (ref 2.00–3.50)
Protime: 22.8 Seconds — ABNORMAL HIGH (ref 10.6–13.4)

## 2013-03-28 LAB — POCT INR: INR: 1.9

## 2013-03-28 NOTE — Progress Notes (Signed)
INR just below goal today @ 1.9. Pt is doing well, she does mention some complaints of headache. She reports no missed doses/extra doses or bleeding/bruising. She has now increased her Prilosec to twice a day. We will keep her on the same regimen of coumadin for now. Continue coumadin 6mg  daily.  Recheck INR on 04/17/13;  Lab at 10:30am and MD visit at 11:00am and coumadin clinic at 12 noon.

## 2013-03-28 NOTE — Patient Instructions (Addendum)
INR at goal today (just below at 1.9 very close) No changes Continue coumadin 6mg  daily.  Recheck INR on 04/17/13;  Lab at 10:30am and MD visit at 11:00am and coumadin clinic at 12 noon.

## 2013-04-06 ENCOUNTER — Encounter (INDEPENDENT_AMBULATORY_CARE_PROVIDER_SITE_OTHER): Payer: Medicare Other | Admitting: Surgery

## 2013-04-17 ENCOUNTER — Other Ambulatory Visit (HOSPITAL_BASED_OUTPATIENT_CLINIC_OR_DEPARTMENT_OTHER): Payer: Medicare Other | Admitting: Lab

## 2013-04-17 ENCOUNTER — Ambulatory Visit (HOSPITAL_BASED_OUTPATIENT_CLINIC_OR_DEPARTMENT_OTHER): Payer: Medicare Other | Admitting: Oncology

## 2013-04-17 ENCOUNTER — Ambulatory Visit (HOSPITAL_BASED_OUTPATIENT_CLINIC_OR_DEPARTMENT_OTHER): Payer: Medicare Other | Admitting: Pharmacist

## 2013-04-17 VITALS — BP 116/77 | HR 64 | Temp 97.4°F | Resp 18 | Ht 64.0 in | Wt 163.2 lb

## 2013-04-17 DIAGNOSIS — Z862 Personal history of diseases of the blood and blood-forming organs and certain disorders involving the immune mechanism: Secondary | ICD-10-CM

## 2013-04-17 DIAGNOSIS — D6859 Other primary thrombophilia: Secondary | ICD-10-CM

## 2013-04-17 DIAGNOSIS — Z853 Personal history of malignant neoplasm of breast: Secondary | ICD-10-CM

## 2013-04-17 DIAGNOSIS — R894 Abnormal immunological findings in specimens from other organs, systems and tissues: Secondary | ICD-10-CM

## 2013-04-17 DIAGNOSIS — C50911 Malignant neoplasm of unspecified site of right female breast: Secondary | ICD-10-CM

## 2013-04-17 DIAGNOSIS — D6861 Antiphospholipid syndrome: Secondary | ICD-10-CM

## 2013-04-17 DIAGNOSIS — I742 Embolism and thrombosis of arteries of the upper extremities: Secondary | ICD-10-CM

## 2013-04-17 DIAGNOSIS — I82409 Acute embolism and thrombosis of unspecified deep veins of unspecified lower extremity: Secondary | ICD-10-CM

## 2013-04-17 DIAGNOSIS — Z7901 Long term (current) use of anticoagulants: Secondary | ICD-10-CM

## 2013-04-17 DIAGNOSIS — R760 Raised antibody titer: Secondary | ICD-10-CM

## 2013-04-17 LAB — PROTIME-INR
INR: 2.5 (ref 2.00–3.50)
Protime: 30 Seconds — ABNORMAL HIGH (ref 10.6–13.4)

## 2013-04-17 LAB — COMPREHENSIVE METABOLIC PANEL (CC13)
ALT: 13 U/L (ref 0–55)
AST: 17 U/L (ref 5–34)
Albumin: 3.7 g/dL (ref 3.5–5.0)
Alkaline Phosphatase: 83 U/L (ref 40–150)
BUN: 21.8 mg/dL (ref 7.0–26.0)
CO2: 25 mEq/L (ref 22–29)
Calcium: 8.9 mg/dL (ref 8.4–10.4)
Chloride: 107 mEq/L (ref 98–109)
Creatinine: 1.3 mg/dL — ABNORMAL HIGH (ref 0.6–1.1)
Glucose: 59 mg/dl — ABNORMAL LOW (ref 70–140)
Potassium: 4 mEq/L (ref 3.5–5.1)
Sodium: 141 mEq/L (ref 136–145)
Total Bilirubin: 0.54 mg/dL (ref 0.20–1.20)
Total Protein: 6.9 g/dL (ref 6.4–8.3)

## 2013-04-17 LAB — POCT INR: INR: 2.5

## 2013-04-17 NOTE — Progress Notes (Signed)
INR at goal Pt saw Dr. Cyndie Chime today She is doing well, no issues with bleeding or bruising No missed doses/extra doses Still issues with her esophagus related to her recent procedure  Continue coumadin 6mg  daily.   Recheck INR on 05/24/13;  Lab at 11:30am and coumadin clinic at 11:45am.

## 2013-04-17 NOTE — Progress Notes (Signed)
Hematology and Oncology Follow Up Visit  [name] [id number] 07/28/1945 67 y.o. 04/17/2013 8:09 PM   Principle Diagnosis: Encounter Diagnoses  Name Primary?  [name] Antiphospholipid antibody syndrome   . Lupus anticoagulant positive   . Thrombosis, upper extremity artery   . Breast cancer, right Yes  . Chronic anticoagulation      Interim History:   Followup visit for this 67 year old woman who has a history of an arterial embolus to one of the fingers of her left hand in October 1998 as the first sign of antiphospholipid antibody syndrome. She has been on chronic Coumadin anticoagulation with no subsequent thrombotic events.  She developed a stage I, ER-positive cancer of the right breast in December 1999, treated with lumpectomy, radiation, and then hormonal therapy with tamoxifen. She developed a large hematoma at the lumpectomy site which then organized into dense tissue which had to be excised. She still has a dense scar in the right breast.   She had an upper endoscopy and colonoscopy by Dr. Charna Elizabeth on 03/09/2011. There was some question of Barrett esophagus in the past, reported by another gastroenterologist. However, Dr. Loreta Ave did not find any evidence of this on her study . There were changes from prior Nissen fundoplication. There was a questionable small patch of Barrett esophagus above the Z-line which was biopsied. Biopsies returned showing focal mild active esophagitis but no evidence for Barrett esophagus. She was started on an antireflux regimen.  Her symptoms progress. She was evaluated by Dr. Luretha Murphy. She underwent a Nissen fundoplication on 12/12/2012. She only got partial relief of her symptoms. She was put back on a trial of Reglan but did not tolerate it and did not think that it helped her much. She is on twice daily proton pump inhibitor. She is still experiencing dysphagia with a sense that food sticks in the epigastric region and doesn't pass through. No  vomiting. Intermittent hoarse voice.  She continues to get intermittent sharp pain in the right side of her head which is episodic. Quality different from previous migraines.   Medications: reviewed  Allergies:  Allergies  Allergen Reactions  . Vitamin K Anaphylaxis and Rash    "kills me" iv  . Benadryl [Diphenhydramine Hcl]     Rash  . Crestor [Rosuvastatin Calcium]     Muscle Aches  . Daypro [Oxaprozin]     Rash  . Doxycycline     Tongue burning, thrush  . Hornet Venom     Anaphylaxis Shock  . Levofloxacin   . Levofloxacin     Rash  . Reglan [Metoclopramide] Other (See Comments)    Tongue got numb; didn't feel good  . Zocor [Simvastatin]     Muscle Aches  . Sulfonamide Derivatives Rash    Review of Systems: Constitutional:   No anorexia; she has lost about 10 pounds since surgery in May HEENT no sore throat. hoarse voice intermittent likely related to reflux Respiratory: No cough or dyspnea Cardiovascular:  No chest pain or palpitations Gastrointestinal: See above Genito-Urinary: Not questioned Musculoskeletal: Intermittent chronic back pain Neurologic: See above. No focal weakness. No paresthesias Skin: No rash or ecchymosis  Remaining ROS negative.    Physical Exam: Blood pressure 116/77, pulse 64, temperature 97.4 F (36.3 C), temperature source Oral, resp. rate 18, height 5\' 4"  (1.626 m), weight 163 lb 3.2 oz (74.027 kg). Wt Readings from Last 3 Encounters:  04/17/13 163 lb 3.2 oz (74.027 kg)  03/08/13 164 lb 9.6 oz (74.662 kg)  01/03/13 165 lb (74.844 kg)     General appearance: Well-nourished Caucasian woman HENNT: Pharynx no erythema exudate or ulcer, midline scar neck from previous thyroidectomy Lymph nodes: No cervical, supraclavicular, or axillary adenopathy Breasts: Surgical changes in the right breast with dense nodular scar upper inner quadrant, no left breast masses Lungs: Clear to auscultation resonant to percussion Heart: Regular rhythm  no murmur gallop Abdomen: Soft, nontender, no mass, no organomegaly Extremities: No edema, no calf tenderness Musculoskeletal: No joint deformities GU: Vascular: No carotid bruits, no cyanosis Neurologic: Mental status intact, PERRLA, optic discs sharp vessels normal, motor strength 5 over 5, reflexes absent symmetric at the knees, 1+ symmetric at the biceps Skin: No rash or ecchymosis  Lab Results: Lab Results  Component Value Date   WBC 7.3 12/22/2012   HGB 13.4 12/22/2012   HCT 39.5 12/22/2012   MCV 90.8 12/22/2012   PLT Clumped Platelets--Appears Adequate 12/22/2012     Chemistry      Component Value Date/Time   NA 141 04/17/2013 1019   NA 137 12/06/2012 1355   K 4.0 04/17/2013 1019   K 4.6 12/06/2012 1355   CL 99 12/06/2012 1355   CL 102 10/19/2012 1301   CO2 25 04/17/2013 1019   CO2 29 12/06/2012 1355   BUN 21.8 04/17/2013 1019   BUN 18 12/06/2012 1355   CREATININE 1.3* 04/17/2013 1019   CREATININE 0.95 12/12/2012 2012      Component Value Date/Time   CALCIUM 8.9 04/17/2013 1019   CALCIUM 9.8 12/06/2012 1355   ALKPHOS 83 04/17/2013 1019   ALKPHOS 103 07/27/2012 2235   AST 17 04/17/2013 1019   AST 21 07/27/2012 2235   ALT 13 04/17/2013 1019   ALT 17 07/27/2012 2235   BILITOT 0.54 04/17/2013 1019   BILITOT 0.3 07/27/2012 2235     Impression: #1. Antiphospholipid antibody syndrome  #2. Arterial embolus to a digit of her left hand secondary to #1.  #3. Chronic Coumadin anticoagulation due to #1 and #2.   She continues to have her Coumadin monitored through our office Coumadin clinic.  #4. Stage I ER positive cancer the right breast now out almost 15 years with no evidence for new disease  #5. Chronic anxiety and depression  #6. History of reflux esophagitis with history of recent redo Nissen fundoplication  #7. History of esophageal stricture secondary to #6  #8. Remote thyroid cancer status post thyroidectomy  #9. 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 Atypical neurologic  symptoms.  Previous extensive evaluation unremarkable. MRI/MRA, carotid Doppler studies, unremarkable done in 2010     CC:. Dr. [facility]; Dr. Alba Destine, MD 9/23/20148:09 PM he

## 2013-04-17 NOTE — Patient Instructions (Addendum)
INR at goal No changes Continue coumadin 6mg  daily.   Recheck INR on 05/24/13;  Lab at 11:30am and coumadin clinic at 11:45am.

## 2013-04-19 ENCOUNTER — Telehealth: Payer: Self-pay | Admitting: Oncology

## 2013-04-19 NOTE — Telephone Encounter (Signed)
Gave pt appt for lab , flush and MD on October 2015

## 2013-05-11 ENCOUNTER — Encounter (INDEPENDENT_AMBULATORY_CARE_PROVIDER_SITE_OTHER): Payer: Self-pay | Admitting: Surgery

## 2013-05-11 ENCOUNTER — Telehealth (INDEPENDENT_AMBULATORY_CARE_PROVIDER_SITE_OTHER): Payer: Self-pay | Admitting: *Deleted

## 2013-05-11 ENCOUNTER — Other Ambulatory Visit (INDEPENDENT_AMBULATORY_CARE_PROVIDER_SITE_OTHER): Payer: Self-pay

## 2013-05-11 ENCOUNTER — Ambulatory Visit (INDEPENDENT_AMBULATORY_CARE_PROVIDER_SITE_OTHER): Payer: Medicare Other | Admitting: Surgery

## 2013-05-11 VITALS — BP 144/88 | HR 64 | Temp 98.8°F | Resp 14 | Ht 65.0 in | Wt 162.4 lb

## 2013-05-11 DIAGNOSIS — K219 Gastro-esophageal reflux disease without esophagitis: Secondary | ICD-10-CM

## 2013-05-11 DIAGNOSIS — Z9889 Other specified postprocedural states: Secondary | ICD-10-CM

## 2013-05-11 NOTE — Patient Instructions (Signed)
Thanks for your patience.  If you need further assistance after leaving the office, please call our office and speak with a [facility] nurse.  [phone].  If you want to leave a message for Dr. Melodie Ashworth, please call his office phone at [phone]. 

## 2013-05-11 NOTE — Telephone Encounter (Signed)
I spoke with pt and informed her of her appt at [facility] for her UGI on 05/21/13 with an arrival time of 8:45 am.  Instructed pt to be NPO after midnight the night before.

## 2013-05-11 NOTE — Progress Notes (Signed)
[name] 67 y.o.  Body mass index is 27.02 kg/(m^2).  Patient Active Problem List   Diagnosis Date Noted  . GERD - post failed open Nissen 11/23/2012  . Dysphagia, unspecified 09/28/2012  . S/P Open Nissen 1998 Dr. Mardella Layman 08/18/2012  . 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 Lupus anticoagulant positive 06/01/2011  . Chest pain   . Hx-TIA (transient ischemic attack)   . Hypothyroidism   . Breast cancer   . History of DVT (deep vein thrombosis)   . History of antiphospholipid antibody syndrome     Allergies  Allergen Reactions  . Vitamin K Anaphylaxis and Rash    "kills me" iv  . Benadryl [Diphenhydramine Hcl]     Rash  . Crestor [Rosuvastatin Calcium]     Muscle Aches  . Daypro [Oxaprozin]     Rash  . Doxycycline     Tongue burning, thrush  . Hornet Venom     Anaphylaxis Shock  . Levofloxacin   . Levofloxacin     Rash  . Reglan [Metoclopramide] Other (See Comments)    Tongue got numb; didn't feel good  . Zocor [Simvastatin]     Muscle Aches  . Sulfonamide Derivatives Rash    Past Surgical History  Procedure Laterality Date  . Cardiac catheterization  07/12/1997    NORMAL LEFT VENTRICULAR FUNCTION WITH EF AT LEAST 70-75%  . Thyroidectomy  in 20's  . Left oophorectomy  1982  . Right oophorectomy  1980  . Abdominal hysterectomy    . Esophageal manometry N/A 11/06/2012    Procedure: ESOPHAGEAL MANOMETRY (EM);  Surgeon: Valarie Merino, MD;  Location: [facility];  Service: General;  Laterality: N/A;  . Esophagogastroduodenoscopy endoscopy    . Tonsillectomy  67 years old  . Bunionectomy Bilateral 30 years ago  . Knee arthroscopy Right   . Breast surgery Right     x3  . Laparoscopic nissen fundoplication N/A 12/12/2012    Procedure: LAPAROSCOPIC TAKEDOWN  PERI-HIATAL HERNIA    REPAIR;  Surgeon: Valarie Merino, MD;  Location: [facility];  Service: General;  Laterality: N/A;  . Upper gi endoscopy N/A  12/12/2012    Procedure: UPPER GI ENDOSCOPY;  Surgeon: Valarie Merino, MD;  Location: [facility];  Service: General;  Laterality: N/A;   KAPLAN,KRISTEN, PA-C No diagnosis found.  Postop after taking down Dr. Debbe Odea. I did this back in May. She still having some occasional dysphagia. At that time I noted that she may have a bulge and diverticulum of her esophagus. When good barium swallow and looked at. I'll see her back after barium swallow. Otherwise and she's doing better. [name], MD, [facility] [phone] beeper [phone]  05/11/2013 3:33 PM

## 2013-05-21 ENCOUNTER — Ambulatory Visit
Admission: RE | Admit: 2013-05-21 | Discharge: 2013-05-21 | Disposition: A | Discharge status: Home / Self Care | Payer: Medicare Other | Source: Ambulatory Visit | Attending: Surgery | Admitting: Surgery

## 2013-05-21 DIAGNOSIS — Z9889 Other specified postprocedural states: Secondary | ICD-10-CM

## 2013-05-24 ENCOUNTER — Ambulatory Visit (HOSPITAL_BASED_OUTPATIENT_CLINIC_OR_DEPARTMENT_OTHER): Payer: Medicare Other | Admitting: Pharmacist

## 2013-05-24 ENCOUNTER — Other Ambulatory Visit (HOSPITAL_BASED_OUTPATIENT_CLINIC_OR_DEPARTMENT_OTHER): Payer: Medicare Other | Admitting: Lab

## 2013-05-24 DIAGNOSIS — R760 Raised antibody titer: Secondary | ICD-10-CM

## 2013-05-24 DIAGNOSIS — Z7901 Long term (current) use of anticoagulants: Secondary | ICD-10-CM

## 2013-05-24 DIAGNOSIS — D6861 Antiphospholipid syndrome: Secondary | ICD-10-CM

## 2013-05-24 DIAGNOSIS — D6859 Other primary thrombophilia: Secondary | ICD-10-CM

## 2013-05-24 DIAGNOSIS — I82409 Acute embolism and thrombosis of unspecified deep veins of unspecified lower extremity: Secondary | ICD-10-CM

## 2013-05-24 DIAGNOSIS — R894 Abnormal immunological findings in specimens from other organs, systems and tissues: Secondary | ICD-10-CM

## 2013-05-24 LAB — PROTIME-INR
INR: 2.3 (ref 2.00–3.50)
Protime: 27.6 Seconds — ABNORMAL HIGH (ref 10.6–13.4)

## 2013-05-24 LAB — POCT INR: INR: 2.3

## 2013-05-24 NOTE — Progress Notes (Signed)
INR at goal Pt is doing well although she is experiencing some pain around her left ankle on the top of her foot Pt also states she pulled a tick out of her side earlier this week and went to see her PCP due to the area becoming infected She was prescribed Keflex x 10 days which should have minimal effect on her INR as well as Nystatin mouth rinse for thrush Other than this [name] is doing well and is stable on her current dose No missed/extra doses No bleeding Small bruise on her head from dropping a phone on her forehead while she was laying down yesterday No changes Continue coumadin 6mg  daily.  Recheck INR on 06/26/13;  Lab at 11:30am and coumadin clinic at 11:45am.

## 2013-05-24 NOTE — Patient Instructions (Signed)
INR at goal No changes Continue coumadin 6mg  daily.   Recheck INR on 06/26/13;  Lab at 11:30am and coumadin clinic at 11:45am.

## 2013-06-26 ENCOUNTER — Other Ambulatory Visit (HOSPITAL_BASED_OUTPATIENT_CLINIC_OR_DEPARTMENT_OTHER): Payer: Medicare Other

## 2013-06-26 ENCOUNTER — Ambulatory Visit (HOSPITAL_BASED_OUTPATIENT_CLINIC_OR_DEPARTMENT_OTHER): Payer: Medicare Other | Admitting: Pharmacist

## 2013-06-26 DIAGNOSIS — D6861 Antiphospholipid syndrome: Secondary | ICD-10-CM

## 2013-06-26 DIAGNOSIS — I82409 Acute embolism and thrombosis of unspecified deep veins of unspecified lower extremity: Secondary | ICD-10-CM

## 2013-06-26 DIAGNOSIS — D6859 Other primary thrombophilia: Secondary | ICD-10-CM

## 2013-06-26 DIAGNOSIS — R894 Abnormal immunological findings in specimens from other organs, systems and tissues: Secondary | ICD-10-CM

## 2013-06-26 DIAGNOSIS — R760 Raised antibody titer: Secondary | ICD-10-CM

## 2013-06-26 LAB — PROTIME-INR
INR: 2.4 (ref 2.00–3.50)
Protime: 28.8 Seconds — ABNORMAL HIGH (ref 10.6–13.4)

## 2013-06-26 LAB — POCT INR: INR: 2.4

## 2013-06-26 NOTE — Progress Notes (Signed)
Pt seen in clinic today INR=2.6 No changes to report She complains of a presumed broken toe Hydrocodone is giving her relief at this point Continue coumadin 6mg  daily.   Recheck INR on 08/07/13;  Lab at 11:30am and coumadin clinic at 11:45am.

## 2013-06-26 NOTE — Patient Instructions (Signed)
Continue coumadin 6mg  daily.  Recheck INR on 08/07/13;  Lab at 11:30am and coumadin clinic at 11:45am.

## 2013-06-29 ENCOUNTER — Other Ambulatory Visit: Payer: Self-pay | Admitting: Family Medicine

## 2013-06-29 ENCOUNTER — Ambulatory Visit
Admission: RE | Admit: 2013-06-29 | Discharge: 2013-06-29 | Disposition: A | Discharge status: Home / Self Care | Payer: Medicare Other | Source: Ambulatory Visit | Attending: Oncology | Admitting: Oncology

## 2013-06-29 DIAGNOSIS — I742 Embolism and thrombosis of arteries of the upper extremities: Secondary | ICD-10-CM

## 2013-06-29 DIAGNOSIS — Z7901 Long term (current) use of anticoagulants: Secondary | ICD-10-CM

## 2013-06-29 DIAGNOSIS — N644 Mastodynia: Secondary | ICD-10-CM

## 2013-06-29 DIAGNOSIS — R760 Raised antibody titer: Secondary | ICD-10-CM

## 2013-06-29 DIAGNOSIS — C50911 Malignant neoplasm of unspecified site of right female breast: Secondary | ICD-10-CM

## 2013-06-29 DIAGNOSIS — D6861 Antiphospholipid syndrome: Secondary | ICD-10-CM

## 2013-07-20 ENCOUNTER — Ambulatory Visit
Admission: RE | Admit: 2013-07-20 | Discharge: 2013-07-20 | Disposition: A | Discharge status: Home / Self Care | Payer: Medicare Other | Source: Ambulatory Visit | Attending: Family Medicine | Admitting: Family Medicine

## 2013-07-20 DIAGNOSIS — N644 Mastodynia: Secondary | ICD-10-CM

## 2013-07-22 ENCOUNTER — Other Ambulatory Visit: Payer: Self-pay | Admitting: Oncology

## 2013-07-22 DIAGNOSIS — R894 Abnormal immunological findings in specimens from other organs, systems and tissues: Secondary | ICD-10-CM

## 2013-07-22 DIAGNOSIS — D6859 Other primary thrombophilia: Secondary | ICD-10-CM

## 2013-07-23 ENCOUNTER — Telehealth: Payer: Self-pay | Admitting: *Deleted

## 2013-07-23 ENCOUNTER — Other Ambulatory Visit: Payer: Self-pay | Admitting: Oncology

## 2013-07-23 NOTE — Telephone Encounter (Signed)
Received vm call from pt stating that she had a mammogram with U/S done & wants to know if Dr Cyndie Chime would suggest MRI.  Note to Dr Cyndie Chime.

## 2013-07-24 ENCOUNTER — Other Ambulatory Visit: Payer: Self-pay | Admitting: Oncology

## 2013-07-24 ENCOUNTER — Telehealth: Payer: Self-pay | Admitting: *Deleted

## 2013-07-24 DIAGNOSIS — C50911 Malignant neoplasm of unspecified site of right female breast: Secondary | ICD-10-CM

## 2013-07-24 NOTE — Telephone Encounter (Signed)
Received vm call from pt requesting refill on her warfarin 6 mg.  Per chart, looks like this was done yesterday.  Left vm on pt's cell # letting her know this.

## 2013-07-25 ENCOUNTER — Telehealth: Payer: Self-pay | Admitting: Oncology

## 2013-07-25 NOTE — Telephone Encounter (Signed)
per 12/30 pof pt needs rt breast mri @ [facility]. breast mri no longer done thru breast center and pt's must now call directly into [facility] to schedule appt. s/w pt she is aware and has been given [facility] also location for [facility] to call for appt. no other orders.

## 2013-08-02 ENCOUNTER — Ambulatory Visit
Admission: RE | Admit: 2013-08-02 | Discharge: 2013-08-02 | Disposition: A | Discharge status: Home / Self Care | Payer: Medicare Other | Source: Ambulatory Visit | Attending: Oncology | Admitting: Oncology

## 2013-08-02 DIAGNOSIS — C50911 Malignant neoplasm of unspecified site of right female breast: Secondary | ICD-10-CM

## 2013-08-02 MED ORDER — GADOBENATE DIMEGLUMINE 529 MG/ML IV SOLN
8.0000 mL | Freq: Once | INTRAVENOUS | Status: AC | PRN
  Administered 2013-08-02: 8 mL via INTRAVENOUS

## 2013-08-03 ENCOUNTER — Telehealth: Payer: Self-pay | Admitting: *Deleted

## 2013-08-03 NOTE — Telephone Encounter (Signed)
Message copied by Jesse Fall on Fri Aug 03, 2013  3:30 PM ------      Message from: Annia Belt      Created: Fri Aug 03, 2013  1:24 PM       Call pt breast MRI normal except for expected post surgical changes ------

## 2013-08-03 NOTE — Telephone Encounter (Signed)
Informed pt of Breast MRI report per Dr Azucena Freed instructions.

## 2013-08-07 ENCOUNTER — Other Ambulatory Visit (HOSPITAL_BASED_OUTPATIENT_CLINIC_OR_DEPARTMENT_OTHER): Payer: Medicare Other

## 2013-08-07 ENCOUNTER — Ambulatory Visit (HOSPITAL_BASED_OUTPATIENT_CLINIC_OR_DEPARTMENT_OTHER): Payer: Self-pay | Admitting: Pharmacist

## 2013-08-07 ENCOUNTER — Encounter (INDEPENDENT_AMBULATORY_CARE_PROVIDER_SITE_OTHER): Payer: Self-pay

## 2013-08-07 DIAGNOSIS — R760 Raised antibody titer: Secondary | ICD-10-CM

## 2013-08-07 DIAGNOSIS — R894 Abnormal immunological findings in specimens from other organs, systems and tissues: Secondary | ICD-10-CM

## 2013-08-07 DIAGNOSIS — D6859 Other primary thrombophilia: Secondary | ICD-10-CM

## 2013-08-07 DIAGNOSIS — I82409 Acute embolism and thrombosis of unspecified deep veins of unspecified lower extremity: Secondary | ICD-10-CM

## 2013-08-07 DIAGNOSIS — D6861 Antiphospholipid syndrome: Secondary | ICD-10-CM

## 2013-08-07 LAB — POCT INR: INR: 2.4

## 2013-08-07 LAB — PROTIME-INR
INR: 2.4 (ref 2.00–3.50)
Protime: 28.8 Seconds — ABNORMAL HIGH (ref 10.6–13.4)

## 2013-08-07 NOTE — Progress Notes (Signed)
INR continues to be at goal on coumadin 6mg  daily.  Alprazolam will be changing to lorazepam due to insurance coverage.  This should not effect INR.  No other med changes.  No bleeding or bruising.  Will continue current coumadin dose and check PT/INR in 6 weeks.  [name] knows to call with any questions before next appt.

## 2013-08-14 ENCOUNTER — Encounter: Payer: Self-pay | Admitting: Cardiovascular Disease

## 2013-08-21 ENCOUNTER — Other Ambulatory Visit: Payer: Self-pay | Admitting: Family Medicine

## 2013-08-21 ENCOUNTER — Telehealth: Payer: Self-pay | Admitting: Pharmacist

## 2013-08-21 DIAGNOSIS — R209 Unspecified disturbances of skin sensation: Secondary | ICD-10-CM

## 2013-08-21 DIAGNOSIS — M79605 Pain in left leg: Principal | ICD-10-CM

## 2013-08-21 DIAGNOSIS — M79604 Pain in right leg: Secondary | ICD-10-CM

## 2013-08-21 NOTE — Telephone Encounter (Signed)
[name] has stated on ropinirole and there is no DI with Coumadin.

## 2013-08-23 ENCOUNTER — Ambulatory Visit
Admission: RE | Admit: 2013-08-23 | Discharge: 2013-08-23 | Disposition: A | Discharge status: Home / Self Care | Payer: Medicare Other | Source: Ambulatory Visit | Attending: Family Medicine | Admitting: Family Medicine

## 2013-08-23 DIAGNOSIS — M79604 Pain in right leg: Secondary | ICD-10-CM

## 2013-08-23 DIAGNOSIS — R209 Unspecified disturbances of skin sensation: Secondary | ICD-10-CM

## 2013-08-23 DIAGNOSIS — M79605 Pain in left leg: Principal | ICD-10-CM

## 2013-08-31 ENCOUNTER — Telehealth: Payer: Self-pay | Admitting: Pharmacist

## 2013-08-31 NOTE — Telephone Encounter (Signed)
[name] called to let us know that she has stated Augmentin for 10 day for a sinus infection.  There is a potential drug interaction with augmentin and warfarin.  Will check PT/INR next week on 09/06/13 to evaluate drug interaction.  I have cancelled appt on 2/24.

## 2013-09-06 ENCOUNTER — Other Ambulatory Visit (HOSPITAL_BASED_OUTPATIENT_CLINIC_OR_DEPARTMENT_OTHER): Payer: Medicare Other

## 2013-09-06 ENCOUNTER — Ambulatory Visit (HOSPITAL_BASED_OUTPATIENT_CLINIC_OR_DEPARTMENT_OTHER): Payer: Medicare Other | Admitting: Pharmacist

## 2013-09-06 DIAGNOSIS — D6859 Other primary thrombophilia: Secondary | ICD-10-CM

## 2013-09-06 DIAGNOSIS — R760 Raised antibody titer: Secondary | ICD-10-CM

## 2013-09-06 DIAGNOSIS — D6861 Antiphospholipid syndrome: Secondary | ICD-10-CM

## 2013-09-06 DIAGNOSIS — R894 Abnormal immunological findings in specimens from other organs, systems and tissues: Secondary | ICD-10-CM

## 2013-09-06 DIAGNOSIS — I82409 Acute embolism and thrombosis of unspecified deep veins of unspecified lower extremity: Secondary | ICD-10-CM

## 2013-09-06 LAB — POCT INR
INR: 1.5
INR: 1.5

## 2013-09-06 LAB — PROTIME-INR
INR: 1.5 — ABNORMAL LOW (ref 2.00–3.50)
Protime: 18 Seconds — ABNORMAL HIGH (ref 10.6–13.4)

## 2013-09-06 NOTE — Progress Notes (Signed)
Pt seen in clinic today. INR=1.5 She is about mid point of a 2 week course of Augmentin 875 mg for sinus infection. She states she has switched from xanax to ativan and also started requip for rls I have asked her to bring in her medication bottles next CC visit so we can correct/change her medication list Instructed to take coumadin 8mg  today and next tue then resume coumadin 6mg  daily until your next clinic visit on 09/18/13;  Lab at 11:00am and coumadin clinic at 11:15am.

## 2013-09-06 NOTE — Patient Instructions (Signed)
Take coumadin 8mg  today and next tue then resume coumadin 6mg  daily until your next clinic visit on 09/18/13;  Lab at 11:00am and coumadin clinic at 11:15am.

## 2013-09-18 ENCOUNTER — Ambulatory Visit: Payer: Medicare Other

## 2013-09-18 ENCOUNTER — Other Ambulatory Visit: Payer: Medicare Other

## 2013-09-19 ENCOUNTER — Encounter (INDEPENDENT_AMBULATORY_CARE_PROVIDER_SITE_OTHER): Payer: Self-pay

## 2013-09-19 ENCOUNTER — Ambulatory Visit (HOSPITAL_BASED_OUTPATIENT_CLINIC_OR_DEPARTMENT_OTHER): Payer: Medicare Other | Admitting: Pharmacist

## 2013-09-19 ENCOUNTER — Other Ambulatory Visit (HOSPITAL_BASED_OUTPATIENT_CLINIC_OR_DEPARTMENT_OTHER): Payer: Medicare Other

## 2013-09-19 DIAGNOSIS — R760 Raised antibody titer: Secondary | ICD-10-CM

## 2013-09-19 DIAGNOSIS — D6859 Other primary thrombophilia: Secondary | ICD-10-CM

## 2013-09-19 DIAGNOSIS — D6861 Antiphospholipid syndrome: Secondary | ICD-10-CM

## 2013-09-19 DIAGNOSIS — R894 Abnormal immunological findings in specimens from other organs, systems and tissues: Secondary | ICD-10-CM

## 2013-09-19 DIAGNOSIS — I82409 Acute embolism and thrombosis of unspecified deep veins of unspecified lower extremity: Secondary | ICD-10-CM

## 2013-09-19 LAB — PROTIME-INR
INR: 1.3 — ABNORMAL LOW (ref 2.00–3.50)
Protime: 15.6 Seconds — ABNORMAL HIGH (ref 10.6–13.4)

## 2013-09-19 LAB — POCT INR: INR: 1.3

## 2013-09-19 NOTE — Progress Notes (Signed)
INR remains below goal Pt is doing well today with no major complaints other then her chronic issues involving restless leg and sinus problems Pt reports no missed or extra doses in the last two week No unusual bleeding or bruising and no signs/symptoms of a clot No diet changes Pt did stop pravastatin 4 weeks ago (as instructed by her PCP) due to leg pain This is likely the reason for her low INR as pravastatin can increase the effect of anticoagulants while patients are taking pravastain and can show decreased effects when it is stopped. We will increase her dose slightly and recheck next week [name] is planning a trip to [place] to see family next week on 09/26/13 and will be gone for ~ 3 weeks. So it is important we try to stabilize her coumadin dose prior to the trip Plan: Take coumadin 8mg  the next 3 days  then increase to 8 mg on MWF and 6 mg the other days of the week Recheck INR on 09/25/13;  Lab at 11:30am and coumadin clinic at 11:45am.

## 2013-09-19 NOTE — Patient Instructions (Signed)
INR remains below goal Likely due to stopping pravastatin Take coumadin 8mg  the next 3 days  then increase to 8 mg on MWF and 6 mg the other days of the week until your next clinic visit on 09/25/13;  Lab at 11:30am and coumadin clinic at 11:45am.

## 2013-09-23 ENCOUNTER — Encounter: Payer: Self-pay | Admitting: Oncology

## 2013-09-25 ENCOUNTER — Ambulatory Visit (HOSPITAL_BASED_OUTPATIENT_CLINIC_OR_DEPARTMENT_OTHER): Payer: Medicare Other | Admitting: Pharmacist

## 2013-09-25 ENCOUNTER — Other Ambulatory Visit (HOSPITAL_BASED_OUTPATIENT_CLINIC_OR_DEPARTMENT_OTHER): Payer: Medicare Other

## 2013-09-25 ENCOUNTER — Telehealth: Payer: Self-pay | Admitting: Pharmacist

## 2013-09-25 DIAGNOSIS — Z86718 Personal history of other venous thrombosis and embolism: Secondary | ICD-10-CM

## 2013-09-25 DIAGNOSIS — K5900 Constipation, unspecified: Secondary | ICD-10-CM

## 2013-09-25 DIAGNOSIS — D6859 Other primary thrombophilia: Secondary | ICD-10-CM

## 2013-09-25 DIAGNOSIS — R760 Raised antibody titer: Secondary | ICD-10-CM

## 2013-09-25 DIAGNOSIS — Z7901 Long term (current) use of anticoagulants: Secondary | ICD-10-CM

## 2013-09-25 DIAGNOSIS — D6861 Antiphospholipid syndrome: Secondary | ICD-10-CM

## 2013-09-25 LAB — PROTIME-INR
INR: 2.3 (ref 2.00–3.50)
Protime: 27.6 Seconds — ABNORMAL HIGH (ref 10.6–13.4)

## 2013-09-25 LAB — POCT INR: INR: 2.3

## 2013-09-25 NOTE — Telephone Encounter (Signed)
Pt called pharmacy to inform us she is using Psyllium for constipation.  She takes 1 scoop daily in water or yogurt. I do not see any documented interaction between psyllium & warfarin so I informed pt it is fine for her to take this while on warfarin tx. I added psyllium to pts active med list. Kennith Center, Pharm.D., CPP 09/25/2013@1 :53 PM

## 2013-09-25 NOTE — Progress Notes (Addendum)
INR within goal today. Pt took coumadin as instructed at last visit. No missed doses. No changes in diet. No concerns regarding anticoagulation. No s/s of clotting noted. Pt had one episode of nerve pain/leg cramp in her left thigh recently. Pt stated she is also producing a lot of mucus in her nose. She would like to start taking a supplement that helps with constipation. She would take it daily.  She believes it is a capsule containing corn husks.  She will call us with the name of the medication. We discussed sometimes Psyllium/Fiber supplements can decrease an INR. She will call us with the name of the medication and discuss before she starts taking it. She is leaving for a 3-week vacation on 3/5. She will be back on 3/26. Continue Coumadin 6mg  daily except 8 mg on MWF.  Recheck INR in 4 weeks on 10/22/13.  Lab at 11:30am and coumadin clinic at 11:45am.

## 2013-09-25 NOTE — Patient Instructions (Signed)
Continue Coumadin 6mg  daily except 8 mg on MWF.  Recheck INR in 4 weeks on 10/22/13.  Lab at 11:30am and coumadin clinic at 11:45am.

## 2013-10-22 ENCOUNTER — Other Ambulatory Visit (HOSPITAL_BASED_OUTPATIENT_CLINIC_OR_DEPARTMENT_OTHER): Payer: Medicare Other

## 2013-10-22 ENCOUNTER — Ambulatory Visit (HOSPITAL_BASED_OUTPATIENT_CLINIC_OR_DEPARTMENT_OTHER): Payer: Medicare Other | Admitting: Pharmacist

## 2013-10-22 DIAGNOSIS — D6859 Other primary thrombophilia: Secondary | ICD-10-CM

## 2013-10-22 DIAGNOSIS — R760 Raised antibody titer: Secondary | ICD-10-CM

## 2013-10-22 DIAGNOSIS — D6861 Antiphospholipid syndrome: Secondary | ICD-10-CM

## 2013-10-22 DIAGNOSIS — Z7901 Long term (current) use of anticoagulants: Secondary | ICD-10-CM

## 2013-10-22 LAB — PROTIME-INR
INR: 3.5 (ref 2.00–3.50)
Protime: 42 Seconds — ABNORMAL HIGH (ref 10.6–13.4)

## 2013-10-22 LAB — POCT INR: INR: 3.5

## 2013-10-22 NOTE — Progress Notes (Signed)
INR = 3.5 on Coumadin 6 mg daily except 8 mg on MWF Pt has been in [place] for 3 weeks & while there, last week she had 2 episodes of chest pain.  Pain was 8 out of 10.  Once she had associated L arm pain and then later that day had only pain in her L arm w/o CP.  No associated SOB.  Duration was 1 1/2 minutes.  She rested & it improved.  No indigestion.  She did not take any medications when she had the chest pain to help to relieve the pain. She c/o leg pain & cramps.  She states the pain is worse when lying down.  She has a "sunk-in spot" around her R ankle that is painful to touch. No bleeding/bruising. INR at goal.  I discussed w/ Ned Card, NP today.  Pt will call her cardiologist today & try to get appt there for EKG.  She has appt w/ her PCP this Thursday & she plans to discuss her leg pain w/ PCP.  She may go back on her Neurontin (she has been off Neurontin since 07/2011). I will make no change to her Coumadin given above sxs & plans.  I will have pt return in 1 week for repeat protime & follow along after cardiology & PCP appts to see how she is doing & if any med changes are made. Kennith Center, Pharm.D., CPP 10/22/2013@2 :07 PM

## 2013-10-30 ENCOUNTER — Ambulatory Visit (HOSPITAL_BASED_OUTPATIENT_CLINIC_OR_DEPARTMENT_OTHER): Payer: Self-pay | Admitting: Pharmacist

## 2013-10-30 ENCOUNTER — Other Ambulatory Visit (HOSPITAL_BASED_OUTPATIENT_CLINIC_OR_DEPARTMENT_OTHER): Payer: Medicare Other

## 2013-10-30 DIAGNOSIS — D6861 Antiphospholipid syndrome: Secondary | ICD-10-CM

## 2013-10-30 DIAGNOSIS — D6859 Other primary thrombophilia: Secondary | ICD-10-CM

## 2013-10-30 DIAGNOSIS — Z7901 Long term (current) use of anticoagulants: Secondary | ICD-10-CM

## 2013-10-30 DIAGNOSIS — R760 Raised antibody titer: Secondary | ICD-10-CM

## 2013-10-30 LAB — PROTIME-INR
INR: 2.9 (ref 2.00–3.50)
Protime: 34.8 Seconds — ABNORMAL HIGH (ref 10.6–13.4)

## 2013-10-30 LAB — POCT INR: INR: 2.9

## 2013-10-30 NOTE — Progress Notes (Signed)
INR = 2.9      Goal 2-3 INR within goal range. No complications of anticoagulation noted. Her only medication change is an increase in ropinerole 1 mg from bid to tid. Patient has multiple other issues. She will see a dentist today for problems with her bridge. She will see an ENT on Friday for an ongoing sinus infection that her PCP feels may be fungal. She saw her PCP last week for leg cramps which also continue, her left ankle turns inwards when this occurs and she is making a video of this to show her PCP. She will see a cardiologist on 5/18 for the chest pain she has been experiencing. She is aware that if the dentist or PCP schedule any invasive procedures she should contact us or Dr. Beryle Beams. Patient states she does not yet have an appt with Dr. Beryle Beams at his new clinic, she received a letter stating that she will be seen there. She has the telephone number for internal medicine and will call them to schedule a visit in the future. She will continue Coumadin Continue 6mg  daily except 8 mg on MWF.  Recheck INR in 4 weeks on 11/27/13.  Lab at 11:30 am and coumadin clinic at 11:45 am.  Theone Murdoch, PharmD

## 2013-11-07 ENCOUNTER — Telehealth: Payer: Self-pay | Admitting: Pharmacist

## 2013-11-07 NOTE — Telephone Encounter (Signed)
Patient called to let us know she has started Augmentin 875 mg bid x 14 days for a sinus infection. She was previously on Augmentin in February of 2012 with no adverse effects and therapeutic INR during antibiotic therapy. Her indication for Coumadin is antiphospholipid antibody syndrome and hx of arterial embolus. She is sensitive to small changes in her Coumadin dose, so I will not decrease her dose prophylactically. She will call us if she experiences any bleeding or increased bruising and we will see her sooner than her next scheduled appt which is on 11/27/13.

## 2013-11-22 ENCOUNTER — Telehealth: Payer: Self-pay | Admitting: Cardiovascular Disease

## 2013-11-22 ENCOUNTER — Other Ambulatory Visit: Payer: Self-pay | Admitting: Family Medicine

## 2013-11-22 DIAGNOSIS — R109 Unspecified abdominal pain: Secondary | ICD-10-CM

## 2013-11-22 NOTE — Telephone Encounter (Signed)
New message     Office calling wanted patient appt move up - patient C/O worsening sob & chest pain.

## 2013-11-22 NOTE — Telephone Encounter (Signed)
Left message with office for Margarita Grizzle to call me back

## 2013-11-22 NOTE — Telephone Encounter (Signed)
Spoke with Margarita Grizzle from [facility] who states PA at their office believes patient needs to be seen sooner than 5/18.  I advised Margarita Grizzle that Dr. Acie Fredrickson does not have any openings before that date.  I discussed situation with Dr. Percival Spanish who is agreeable to see the patient on 5/5 at 3 pm.  Margarita Grizzle aware and will advise patient.

## 2013-11-23 ENCOUNTER — Telehealth: Payer: Self-pay | Admitting: Pharmacist

## 2013-11-23 NOTE — Telephone Encounter (Signed)
Pt called to let us know that she started taking Nitrofurantoin BID x 7 days. No interaction noted with her coumadin. She is scheduled to recheck her INR on 11/27/13.

## 2013-11-26 ENCOUNTER — Other Ambulatory Visit: Payer: Self-pay | Admitting: *Deleted

## 2013-11-26 DIAGNOSIS — M79609 Pain in unspecified limb: Secondary | ICD-10-CM

## 2013-11-27 ENCOUNTER — Other Ambulatory Visit (HOSPITAL_BASED_OUTPATIENT_CLINIC_OR_DEPARTMENT_OTHER): Payer: Medicare Other

## 2013-11-27 ENCOUNTER — Encounter: Payer: Self-pay | Admitting: Cardiology

## 2013-11-27 ENCOUNTER — Ambulatory Visit (HOSPITAL_BASED_OUTPATIENT_CLINIC_OR_DEPARTMENT_OTHER): Payer: Self-pay | Admitting: Pharmacist

## 2013-11-27 ENCOUNTER — Ambulatory Visit (INDEPENDENT_AMBULATORY_CARE_PROVIDER_SITE_OTHER): Payer: Medicare Other | Admitting: Cardiology

## 2013-11-27 VITALS — BP 122/70 | HR 72 | Ht 64.5 in | Wt 175.0 lb

## 2013-11-27 DIAGNOSIS — R0602 Shortness of breath: Secondary | ICD-10-CM

## 2013-11-27 DIAGNOSIS — R760 Raised antibody titer: Secondary | ICD-10-CM

## 2013-11-27 DIAGNOSIS — R079 Chest pain, unspecified: Secondary | ICD-10-CM

## 2013-11-27 DIAGNOSIS — D6859 Other primary thrombophilia: Secondary | ICD-10-CM

## 2013-11-27 DIAGNOSIS — D6861 Antiphospholipid syndrome: Secondary | ICD-10-CM

## 2013-11-27 DIAGNOSIS — Z7901 Long term (current) use of anticoagulants: Secondary | ICD-10-CM

## 2013-11-27 LAB — PROTIME-INR
INR: 2.6 (ref 2.00–3.50)
Protime: 31.2 Seconds — ABNORMAL HIGH (ref 10.6–13.4)

## 2013-11-27 LAB — POCT INR: INR: 2.6

## 2013-11-27 LAB — BRAIN NATRIURETIC PEPTIDE: Pro B Natriuretic peptide (BNP): 46 pg/mL (ref 0.0–100.0)

## 2013-11-27 NOTE — Patient Instructions (Signed)
The current medical regimen is effective;  continue present plan and medications.  Your physician has requested that you have a lexiscan myoview. For further information please visit [email or web address]. Please follow instruction sheet, as given.  Please have blood work today (BNP)  Follow up with Dr Acie Fredrickson after testing.

## 2013-11-27 NOTE — Progress Notes (Signed)
Pt seen in clinic today INR of 2.6  No changes to report One day left of Nitrofurantion for UTI States she is headed to [place] to be with daughter who was just diagnosed with Uterine Ca and having surgery Will continue 8mg  on MWF and 6mg  other days No refills needed this appmt RTC on 12/27/13 11:30 lab and 11:45 CC

## 2013-11-27 NOTE — Patient Instructions (Signed)
Continue Coumadin 6mg  daily except 8 mg on MWF.  Recheck INR in 4 weeks on 12/27/13.  Lab at 11:30 am and coumadin clinic at 11:45 am.

## 2013-11-27 NOTE — Progress Notes (Signed)
HPI The patient has a history of chest pain with a normal heart catheterization in 1998 and a normal stress Myoview in March of 2012.   She presents for evaluation of chest discomfort. She says that this has been happening for 3-4 weeks. She reports that it happens sporadically. He describes a discomfort under her left breast. Sometimes it radiates around to her back. It comes on at rest not reproducible with activity. She says it typically lasts for several minutes but can also, and lashes. She has some discomfort up into her jaw. There is some discomfort taking a deep breath or laying on her left side. She is not sure whether this is similar to previous pain. She has had some nausea with it. He can be 9/10 in intensity. She has a headache with at times. She does have dyspnea with exertion but she's not describing PND or orthopnea. She has not had any presyncope or syncope. She has had a 13-14 pound weight gain in 3-4 weeks. Of note she is under stress as her daughter was recently diagnosed with cancer.   Allergies  Allergen Reactions  . Vitamin K Anaphylaxis and Rash    "kills me" iv  . Benadryl [Diphenhydramine Hcl]     Rash  . Crestor [Rosuvastatin Calcium]     Muscle Aches  . Daypro [Oxaprozin]     Rash  . Doxycycline     Tongue burning, thrush  . Hornet Venom     Anaphylaxis Shock  . Levofloxacin   . Levofloxacin     Rash  . Reglan [Metoclopramide] Other (See Comments)    Tongue got numb; didn't feel good  . Zocor [Simvastatin]     Muscle Aches  . Sulfonamide Derivatives Rash    Current Outpatient Prescriptions  Medication Sig Dispense Refill  . aspirin 81 MG tablet Take 81 mg by mouth daily.       [name] buPROPion (WELLBUTRIN SR) 200 MG 12 hr tablet Take 200 mg by mouth 2 (two) times daily.       . Calcium Carbonate-Vitamin D (CALCIUM + D PO) Take 600 mg by mouth. Taking 1 Tablet Daily Tablet contains Magnesium and Zinc.      . HYDROcodone-acetaminophen (NORCO/VICODIN) 5-325  MG per tablet Take 1 tablet by mouth every 6 (six) hours as needed for pain.       [name] levothyroxine (SYNTHROID, LEVOTHROID) 112 MCG tablet Take 112 mcg by mouth daily before breakfast.      . LORazepam (ATIVAN) 1 MG tablet Take 1 mg by mouth 2 (two) times daily.      . metoCLOPramide (REGLAN) 5 MG tablet       . metoprolol succinate (TOPROL-XL) 25 MG 24 hr tablet Take 25 mg by mouth daily.        [name] omeprazole (PRILOSEC) 20 MG capsule       . pseudoephedrine (SUDAFED) 120 MG 12 hr tablet Take 120 mg by mouth as needed.       . psyllium (METAMUCIL) 58.6 % packet Take 1 packet by mouth daily.      [name] rOPINIRole (REQUIP) 1 MG tablet Take 1 mg by mouth 3 (three) times daily.      [name] warfarin (COUMADIN) 4 MG tablet Take 1.5 tablets (6 mg total) by mouth daily at 6 PM. Or as directed by provider.  168 tablet  2  . warfarin (COUMADIN) 4 MG tablet TAKE 2 TABS DAILY EXCEPT 1 AND 1/2 TABS ON SATURDAY AND SUNDAY  168  tablet  2  . [DISCONTINUED] fluticasone (FLONASE) 50 MCG/ACT nasal spray Place 2 sprays into the nose daily.         No current facility-administered medications for this visit.    Past Medical History  Diagnosis Date  . Chest pain   . Hx-TIA (transient ischemic attack)   . Hypothyroidism   . History of DVT (deep vein thrombosis)     in all fingers  . History of antiphospholipid antibody syndrome   . 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 Arthritis   . Hyperlipidemia   . Neuromuscular disorder   . Complication of anesthesia     "hard to put asleep"  . Stroke   . Breast cancer     right  . Hypertension   . Fibromyalgia   . Hepatitis 1990    "from eating at restaurant"  . Headache(784.0)   . H/O hiatal hernia   . GERD (gastroesophageal reflux disease)   . Asthma     as child  . Pneumonia     hx of  . Collapsed lung     hx of, left  . Depression   . Anxiety     severe panic attacks     Past Surgical History  Procedure Laterality Date  . Cardiac catheterization  07/12/1997    NORMAL LEFT VENTRICULAR FUNCTION WITH EF AT LEAST 70-75%  . Thyroidectomy  in 20's  . Left oophorectomy  1982  . Right oophorectomy  1980  . Abdominal hysterectomy    . Esophageal manometry N/A 11/06/2012    Procedure: ESOPHAGEAL MANOMETRY (EM);  Surgeon: Pedro Earls, MD;  Location: [facility];  Service: General;  Laterality: N/A;  . Esophagogastroduodenoscopy endoscopy    . Tonsillectomy  69 years old  . Bunionectomy Bilateral 30 years ago  . Knee arthroscopy Right   . Breast surgery Right     x3  . Laparoscopic nissen fundoplication N/A 7/67/3419    Procedure: LAPAROSCOPIC TAKEDOWN  PERI-HIATAL HERNIA    REPAIR;  Surgeon: Pedro Earls, MD;  Location: [facility];  Service: General;  Laterality: N/A;  . Upper gi endoscopy N/A 12/12/2012    Procedure: UPPER GI ENDOSCOPY;  Surgeon: Pedro Earls, MD;  Location: [facility];  Service: General;  Laterality: N/A;    ROS:  As stated in the HPI and negative for all other systems.  PHYSICAL EXAM BP 122/70  Pulse 72  Ht 5' 4.5" (1.638 m)  Wt 175 lb (79.379 kg)  BMI 29.59 kg/m2 GENERAL:  Well appearing HEENT:  Pupils equal round and reactive, fundi not visualized, oral mucosa unremarkable NECK:  No jugular venous distention, waveform within normal limits, carotid upstroke brisk and symmetric, no bruits, no thyromegaly LYMPHATICS:  No cervical, inguinal adenopathy LUNGS:  Clear to auscultation bilaterally BACK:  No CVA tenderness CHEST:  Unremarkable HEART:  PMI not displaced or sustained,S1 and S2 within normal limits, no S3, no S4, no clicks, no rubs, no murmurs ABD:  Flat, positive bowel sounds normal in frequency in pitch, no bruits, no rebound, no guarding, no midline pulsatile mass, no hepatomegaly, no splenomegaly EXT:  2 plus pulses throughout, no edema, no cyanosis no clubbing SKIN:  No rashes no nodules NEURO:  Cranial nerves II  through XII grossly intact, motor grossly intact throughout PSYCH:  Cognitively intact, oriented to person place and time   EKG:  Sinus rhythm, rate 72, axis  within normal limits, intervals within normal limits, no acute ST-T wave changes.  11/27/2013  ASSESSMENT AND PLAN  CHEST PAIN:  I think that this is somewhat atypical.  However, she does have risk factors.  I will send her for a stress test.  She would not be able to walk on a treadmill.  She will have a [employer].  DYSPNEA:  I will start with a BNP level and the above stress testing.  She can then follow up with Dr. Acie Fredrickson.

## 2013-11-28 ENCOUNTER — Other Ambulatory Visit: Payer: Medicare Other

## 2013-11-28 ENCOUNTER — Telehealth (HOSPITAL_COMMUNITY): Payer: Self-pay

## 2013-11-28 ENCOUNTER — Inpatient Hospital Stay: Admission: RE | Admit: 2013-11-28 | Payer: Medicare Other | Source: Ambulatory Visit

## 2013-11-29 ENCOUNTER — Ambulatory Visit (HOSPITAL_COMMUNITY)
Admission: RE | Admit: 2013-11-29 | Discharge: 2013-11-29 | Disposition: A | Discharge status: Home / Self Care | Payer: Medicare Other | Source: Ambulatory Visit | Attending: Cardiology | Admitting: Cardiology

## 2013-11-29 DIAGNOSIS — R079 Chest pain, unspecified: Secondary | ICD-10-CM | POA: Insufficient documentation

## 2013-11-29 MED ORDER — TECHNETIUM TC 99M SESTAMIBI GENERIC - CARDIOLITE
29.5000 | Freq: Once | INTRAVENOUS | Status: AC | PRN
  Administered 2013-11-29: 30 via INTRAVENOUS

## 2013-11-29 MED ORDER — TECHNETIUM TC 99M SESTAMIBI GENERIC - CARDIOLITE
10.4000 | Freq: Once | INTRAVENOUS | Status: AC | PRN
  Administered 2013-11-29: 10 via INTRAVENOUS

## 2013-11-29 MED ORDER — REGADENOSON 0.4 MG/5ML IV SOLN
0.4000 mg | Freq: Once | INTRAVENOUS | Status: AC
  Administered 2013-11-29: 0.4 mg via INTRAVENOUS

## 2013-11-29 MED ORDER — AMINOPHYLLINE 25 MG/ML IV SOLN
75.0000 mg | Freq: Once | INTRAVENOUS | Status: AC
  Administered 2013-11-29: 75 mg via INTRAVENOUS

## 2013-11-29 NOTE — Procedures (Addendum)
[facility] [address] [place] [address] [phone]  Cardiology Nuclear Med Study  [name] is a 68 y.o. female     MRN : [id number]     DOB: [birth year]  Procedure Date: 11/29/2013  Nuclear Med Background Indication for Stress Test:  Evaluation for Ischemia History:  Asthma and NML CATH 07/12/1997;Last NUC MPI on 10/01/2010-nonischemic;EF=80% Cardiac Risk Factors: CVA, Family History - CAD, Hypertension, Lipids, Overweight, TIA and DVT  Symptoms:  Chest Pain, DOE, Fatigue, Nausea, Near Syncope and headaches   Nuclear Pre-Procedure Caffeine/Decaff Intake:  1:00am NPO After: 11am   IV Site: L Forearm  IV 0.9% NS with Angio Cath:  22g  Chest Size (in):  n/a IV Started by: Rolene Course, RN  Height: 5' 4.5" (1.638 m)  Cup Size: C-Right sided lumpectomy x3;Titanium markers in place;No sticks or pressures right side due to lymphectomy.  BMI:  Body mass index is 29.59 kg/(m^2). Weight:  175 lb (79.379 kg)   Tech Comments:  n/a    Nuclear Med Study 1 or 2 day study: 1 day  Stress Test Type:  [place] Provider:  Minus Breeding, MD   Resting Radionuclide: Technetium 90m Sestamibi  Resting Radionuclide Dose: 10.4 mCi   Stress Radionuclide:  Technetium 53m Sestamibi  Stress Radionuclide Dose: 29.5 mCi           Stress Protocol Rest HR: 65 Stress HR: 73  Rest BP: 122/80 Stress BP: 112/70  Exercise Time (min): n/a METS: n/a   Predicted Max HR: 154 bpm % Max HR: 53.9 bpm Rate Pressure Product: 10873  Dose of Adenosine (mg):  n/a Dose of Lexiscan: 0.4 mg  Dose of Atropine (mg): n/a Dose of Dobutamine: n/a mcg/kg/min (at max HR)  Stress Test Technologist: Leane Para, CCT Nuclear Technologist: Imagene Riches, CNMT   Rest Procedure:  Myocardial perfusion imaging was performed at rest 45 minutes following the intravenous administration of Technetium 21m Sestamibi. Stress Procedure:  The  patient received IV Lexiscan 0.4 mg over 15-seconds.  Technetium 49m Sestamibi injected IV at 30-seconds.  Patient experienced SOB and stomach cramping and 75 mg of Aminophylline IV was administered at 5 minutes. There were no significant changes with Lexiscan.  Quantitative spect images were obtained after a 45 minute delay.  Transient Ischemic Dilatation (Normal <1.22):  1.20 Lung/Heart Ratio (Normal <0.45):  0.23 QGS EDV:  43 ml QGS ESV:  10 ml LV Ejection Fraction: 76%   Pam Hardin Negus, CNMT  PHYSICIAN INTERPRETATION:  Rest ECG: NSR with non-specific ST-T wave changes  Stress ECG: No significant change from baseline ECG and No significant ST segment change suggestive of ischemia.  QPS Raw Data Images:  Significant tracer uptake in subdiaphragmatic splanchnic viscera.  Mild patient motion. Stress Images:  Computer read indicates a very small,mild intensity reversible perfusion defect in the mid to apical septal wall.  This does not correlate with a vascular territory.  Visually, this is not seen.  Normal homogeneous uptake in all areas of the myocardium. Rest Images:  Normal homogeneous uptake in all areas of the myocardium. Subtraction (SDS):  Interpretation of the visual images woud suggest no evidence of ischemia or infarction.  Impression Exercise Capacity:  Lexiscan with no exercise. BP Response:  Normal blood pressure response. Clinical Symptoms:  nausea, dyspnea ECG Impression:  No significant ECG changes with Lexiscan. Comparison with Prior Nuclear Study: No images to compare  Overall Impression:  Normal stress nuclear study.  LV Wall Motion:  NL LV Function; NL Wall Motion   Leonie Man, MD  11/29/2013 6:03 PM

## 2013-12-03 ENCOUNTER — Telehealth: Payer: Self-pay | Admitting: Cardiology

## 2013-12-03 NOTE — Telephone Encounter (Signed)
Called patient with stress test results per Dr. Percival Spanish. And will forward to [place], [place] per MD notes.

## 2013-12-03 NOTE — Telephone Encounter (Signed)
New problem   Pt need to know results of her labs and stress test. Please call pt she need to know results of test before she can fly on plane.

## 2013-12-03 NOTE — Telephone Encounter (Signed)
Patient aware that labs are normal.   She is flying to [facility] Wednesday to be with her daughter for a cancer surgery on Thursday as long as her stress test was okay. Will send message to Dr. Percival Spanish to review. Advised patient we will get back to her with results as soon as possible.

## 2013-12-10 ENCOUNTER — Encounter: Payer: Medicare Other | Admitting: Cardiovascular Disease

## 2013-12-25 ENCOUNTER — Encounter: Payer: Self-pay | Admitting: Vascular Surgery

## 2013-12-26 ENCOUNTER — Ambulatory Visit (INDEPENDENT_AMBULATORY_CARE_PROVIDER_SITE_OTHER): Payer: Medicare Other | Admitting: Vascular Surgery

## 2013-12-26 ENCOUNTER — Ambulatory Visit (HOSPITAL_COMMUNITY)
Admission: RE | Admit: 2013-12-26 | Discharge: 2013-12-26 | Disposition: A | Discharge status: Home / Self Care | Payer: Medicare Other | Source: Ambulatory Visit | Attending: Vascular Surgery | Admitting: Vascular Surgery

## 2013-12-26 ENCOUNTER — Encounter: Payer: Self-pay | Admitting: Vascular Surgery

## 2013-12-26 VITALS — BP 141/79 | HR 95 | Resp 14 | Ht 65.0 in | Wt 178.0 lb

## 2013-12-26 DIAGNOSIS — M79609 Pain in unspecified limb: Secondary | ICD-10-CM | POA: Insufficient documentation

## 2013-12-26 NOTE — Progress Notes (Signed)
[facility] [place] HISTORY AND PHYSICAL    CC:  Painful legs and abdominal pain  [name]., PA-C   HPI: This is a 68 y.o. female who presents today with c/o soreness and painful legs bilaterally.  She states that her feet are numb and cold.  She states that she does have bilateral knee pain and had them injected 2 days ago by Dr. Sharol Given.  He instructed her to drink coconut juice for the cramps.  She states that her legs are sore to touch.  She states that she has some mild ankle edema of the RLE.    She also complains of RUQ and LUQ pain in her abdomen that is aggravated by raising her arms and bending.  She describes it as deep pain.  This has been going on for about 8-9 months. She also states she has LLQ pain that is a sharp pain and not associated with any aggravating factors.  This has been going on longer than a year.  She has had a Nissen procedure in 1998.  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  She did have a negative stress test on 11/29/13.  She also has hx of antiphospholipid syndrome with upper extermity emboli and breast cancer.  She denies hx of diabetes.  She does have an anaphylaxis reaction to Vitamin K.  She is on a statin for her cholesterol.  She is on synthroid for her hypothyroidism.  She is on a beta blocker and aspirin also.  Past Medical History  Diagnosis Date  . Chest pain   . Hx-TIA (transient ischemic attack)   . Hypothyroidism   . History of DVT (deep vein thrombosis)     in all fingers  . History of antiphospholipid antibody syndrome   . 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 Arthritis   . Hyperlipidemia   . Neuromuscular disorder   . Complication of anesthesia     "hard to put asleep"  . Stroke   . Breast cancer     right  . Hypertension   . Fibromyalgia   . Hepatitis 1990    "from  eating at restaurant"  . Headache(784.0)   . H/O hiatal hernia   . GERD (gastroesophageal reflux disease)   . Asthma     as child  . Pneumonia     hx of  . Collapsed lung     hx of, left  . Depression   . Anxiety     severe panic attacks   Past Surgical History  Procedure Laterality Date  . Cardiac catheterization  07/12/1997    NORMAL LEFT VENTRICULAR FUNCTION WITH EF AT LEAST 70-75%  . Thyroidectomy  in 20's  . Left oophorectomy  1982  . Right oophorectomy  1980  . Abdominal hysterectomy    . Esophageal manometry N/A 11/06/2012    Procedure: ESOPHAGEAL MANOMETRY (EM);  Surgeon: Pedro Earls, MD;  Location: [facility];  Service: General;  Laterality: N/A;  . Esophagogastroduodenoscopy endoscopy    . Tonsillectomy  68 years old  . Bunionectomy Bilateral 30 years ago  . Knee arthroscopy Right   . Breast surgery Right     x3  . Laparoscopic nissen fundoplication N/A [id number]/8338    Procedure: LAPAROSCOPIC TAKEDOWN  PERI-HIATAL HERNIA    REPAIR;  Surgeon: Pedro Earls, MD;  Location: [facility];  Service: General;  Laterality: N/A;  . Upper gi endoscopy N/A 12/12/2012    Procedure: UPPER GI ENDOSCOPY;  Surgeon: Pedro Earls, MD;  Location: [facility];  Service: General;  Laterality: N/A;    Allergies  Allergen Reactions  . Vitamin K Anaphylaxis and Rash    "kills me" iv  . Benadryl [Diphenhydramine Hcl]     Rash  . Crestor [Rosuvastatin Calcium]     Muscle Aches  . Daypro [Oxaprozin]     Rash  . Doxycycline     Tongue burning, thrush  . Hornet Venom     Anaphylaxis Shock  . Levofloxacin   . Levofloxacin     Rash  . Reglan [Metoclopramide] Other (See Comments)    Tongue got numb; didn't feel good  . Zocor [Simvastatin]     Muscle Aches  . Sulfonamide Derivatives Rash    Current Outpatient Prescriptions  Medication Sig Dispense Refill  . aspirin 81 MG tablet Take 81 mg by mouth daily.       [name] buPROPion (WELLBUTRIN SR) 200 MG 12 hr tablet Take 200 mg by mouth  2 (two) times daily.       . Calcium Carbonate-Vitamin D (CALCIUM + D PO) Take 600 mg by mouth. Taking 1 Tablet Daily Tablet contains Magnesium and Zinc.      . HYDROcodone-acetaminophen (NORCO/VICODIN) 5-325 MG per tablet Take 1 tablet by mouth every 6 (six) hours as needed for pain.       [name] levothyroxine (SYNTHROID, LEVOTHROID) 112 MCG tablet Take 112 mcg by mouth daily before breakfast.      . LORazepam (ATIVAN) 1 MG tablet Take 1 mg by mouth 2 (two) times daily.      . metoCLOPramide (REGLAN) 5 MG tablet       . metoprolol succinate (TOPROL-XL) 25 MG 24 hr tablet Take 25 mg by mouth daily.        [name] omeprazole (PRILOSEC) 20 MG capsule       . pravastatin (PRAVACHOL) 20 MG tablet Take 20 mg by mouth daily.      . pseudoephedrine (SUDAFED) 120 MG 12 hr tablet Take 120 mg by mouth as needed.       . psyllium (METAMUCIL) 58.6 % packet Take 1 packet by mouth daily.      [name] rOPINIRole (REQUIP) 1 MG tablet Take 1 mg by mouth 3 (three) times daily.      [name] warfarin (COUMADIN) 4 MG tablet Take 1.5 tablets (6 mg total) by mouth daily at 6 PM. Or as directed by provider.  168 tablet  2  . warfarin (COUMADIN) 4 MG tablet TAKE 2 TABS DAILY EXCEPT 1 AND 1/2 TABS ON SATURDAY AND SUNDAY  168 tablet  2  . [DISCONTINUED] fluticasone (FLONASE) 50 MCG/ACT nasal spray Place 2 sprays into the nose daily.         No current facility-administered medications for this visit.    Family History  Problem Relation Age of Onset  . Cancer Maternal Grandmother     colon    History   Social History  . Marital Status: Single    Spouse Name: N/A    Number of Children: N/A  . Years of Education: N/A   Occupational History  . Not on file.   Social History Main Topics  . Smoking status: Never Smoker   . Smokeless tobacco: Never Used  . Alcohol Use: No  . Drug Use: No  . Sexual Activity: Not  on file   Other Topics Concern  . Not on file   Social History Narrative  . No narrative on file     ROS: [x]   Positive   [ ]  Negative   [ ]  All sytems reviewed and are negative  Cardiovascular: []  chest pain/pressure []  palpitations []  SOB lying flat []  DOE [x]  pain in legs while walking-cramping and soreness to touch [x]  pain in feet when lying flat [x]  hx of DVT in hand in early 2000's-on coumadin []  hx of phlebitis [x]  mild swelling RLE [x]  varicose veins  Pulmonary: []  productive cough []  asthma []  wheezing  Neurologic: [x]  hx TIA with left sided weakness of LLE. []  weakness in []  arms []  legs []  numbness in [x]  feet [] difficulty speaking or slurred speech []  temporary loss of vision in one eye []  dizziness  Hematologic: [x]  bleeding problems-on coumadin []  problems with blood clotting easily  GI: [x]  RUQ & LUQ pain with raising arms or bending for 8-9 mos [x]  LLQ pain >1 year-not associated with any aggravating symptoms []  vomiting blood []  blood in stool  GU: []  burning with urination []  blood in urine  Psychiatric: []  hx of major depression  Integumentary: []  rashes []  ulcers  Constitutional: []  fever []  chills   PHYSICAL EXAMINATION:  Filed Vitals:   12/26/13 1312  BP: 141/79  Pulse: 95  Resp: 14   Body mass index is 29.62 kg/(m^2).  General:  WDWN in NAD Gait: Not observed HENT: WNL, normocephalic Eyes: Pupils equal Pulmonary: normal non-labored breathing , without Rales, rhonchi,  wheezing Cardiac: RRR, without  Murmurs, rubs or gallops; without carotid bruits Abdomen: soft, NT, no masses Skin: without rashes, without ulcers  Vascular Exam/Pulses:  Right Left  Radial 2+ (normal) 2+ (normal)  Ulnar 2+ (normal) 1+ (weak)  Femoral 2+ (normal) 2+ (normal)  Popliteal Non palpable Non palpable  DP 2+ (normal) 2+ (normal)  PT 1+ (weak) 1+ (weak)   Extremities: without ischemic changes, without Gangrene , without cellulitis; without open wounds; telangiectasias BLE Musculoskeletal: no muscle wasting or atrophy  Neurologic: A&O X 3; Appropriate  Affect ; SENSATION: normal; MOTOR FUNCTION:  moving all extremities equally. Speech is fluent/normal   Non-Invasive Vascular Imaging:    Outside ABI's 08/23/13: -Right:  1.21  -Left:  1.21  Lower extremity venous duplex 12/26/13: Mild deep CFV reflux  ASSESSMENTPLAN: 68 y.o. female with complaints of constant pain/aching in BLE  -venous duplex today reveals mild deep reflux of the right CFV and palpable pedal pulses.   -she is advised to elevate her legs and a diagram is given to her to demonstrate to lie flat on her back with knees elevated and bent with her feet higher than her knees for 15-20 minutes per day a couple times a day if possible. -She is given an Rx for 15-20 mmHg compression stockings -she is also a member of the [employer] and advised that water aerobics would also be beneficial. -she is advised to continue as much walking as possible and avoid sitting or standing for long periods of time. -she will return to [name] on a PRN basis.  Leontine Locket, PA-C [facility] [phone]  Clinic MD:  Pt seen and examined in conjunction with Dr. Scot Dock  Agree with above.  Deitra Mayo, MD, [place] [phone] 12/26/2013

## 2013-12-27 ENCOUNTER — Other Ambulatory Visit: Payer: Medicare Other

## 2013-12-27 ENCOUNTER — Ambulatory Visit (HOSPITAL_BASED_OUTPATIENT_CLINIC_OR_DEPARTMENT_OTHER): Payer: Medicare Other | Admitting: Pharmacist

## 2013-12-27 DIAGNOSIS — R760 Raised antibody titer: Secondary | ICD-10-CM

## 2013-12-27 DIAGNOSIS — Z7901 Long term (current) use of anticoagulants: Secondary | ICD-10-CM

## 2013-12-27 DIAGNOSIS — D6861 Antiphospholipid syndrome: Secondary | ICD-10-CM

## 2013-12-27 DIAGNOSIS — D6859 Other primary thrombophilia: Secondary | ICD-10-CM

## 2013-12-27 LAB — PROTIME-INR
INR: 2.5 (ref 2.00–3.50)
Protime: 30 Seconds — ABNORMAL HIGH (ref 10.6–13.4)

## 2013-12-27 LAB — POCT INR: INR: 2.5

## 2013-12-27 NOTE — Patient Instructions (Signed)
Continue Coumadin 6mg  daily except 8 mg on MWF.  Recheck INR in 5 weeks on 01/30/14; lab at 11:30 am and coumadin clinic at 11:45 am.

## 2013-12-27 NOTE — Progress Notes (Signed)
INR within goal today. Pt doing well. No problems or concerns regarding anticoagulation. No s/s of clotting noted. No missed coumadin doses. No changes in diet or medications. Pt has been using coconut water for leg cramps. Continue Coumadin 6mg  daily except 8 mg on MWF.  Recheck INR in 5 weeks on 01/30/14; lab at 11:30 am and coumadin clinic at 11:45 am.

## 2013-12-27 NOTE — Addendum Note (Signed)
Addended by: Neysa Hotter on: 12/27/2013 04:53 PM   Modules accepted: Orders, Medications

## 2014-01-23 NOTE — Telephone Encounter (Signed)
Encounter complete. 

## 2014-01-30 ENCOUNTER — Telehealth: Payer: Self-pay | Admitting: *Deleted

## 2014-01-30 ENCOUNTER — Ambulatory Visit (HOSPITAL_BASED_OUTPATIENT_CLINIC_OR_DEPARTMENT_OTHER): Payer: Medicare Other | Admitting: Pharmacist

## 2014-01-30 ENCOUNTER — Other Ambulatory Visit (HOSPITAL_BASED_OUTPATIENT_CLINIC_OR_DEPARTMENT_OTHER): Payer: Medicare Other

## 2014-01-30 DIAGNOSIS — R760 Raised antibody titer: Secondary | ICD-10-CM

## 2014-01-30 DIAGNOSIS — D6859 Other primary thrombophilia: Secondary | ICD-10-CM

## 2014-01-30 DIAGNOSIS — D6861 Antiphospholipid syndrome: Secondary | ICD-10-CM

## 2014-01-30 DIAGNOSIS — Z7901 Long term (current) use of anticoagulants: Secondary | ICD-10-CM

## 2014-01-30 LAB — PROTIME-INR
INR: 3.1 (ref 2.00–3.50)
Protime: 37.2 Seconds — ABNORMAL HIGH (ref 10.6–13.4)

## 2014-01-30 LAB — POCT INR: INR: 3.1

## 2014-01-30 NOTE — Progress Notes (Signed)
INR = 3.1 on 6 mg/day; 8 mg MWF Pt had an occular blood vessel hemorrhage 1 1/2 weeks ago.  It has healed. She hit her L ankle on something at home and had a bruise but that also has healed. Pt notes increased wt gain (4 # in 1 week; 10 # in 1 month).  No SOB. Med change: d/c'd Pravachol (she actually only took this for >1 week) - stopped d/t increased leg cramps. She has an upcoming appt w/ her PCP for labs (CMET & TSH).  She knows to call us if any med changes are made, esp her thyroid supplement. Pt has been walking more to help the circulation in her legs.  She also is wearing knee-hi hose that are made from copper.  This was recommended by vascular MD. INR slightly above goal but she is asymptomatic.  I will leave her at the maintenance dose. She is in process of trying to get an appt w/ Dr. Beryle Beams at the internal medicine office. Repeat protime in 1 month. Kennith Center, Pharm.D., CPP 01/30/2014@1 :06 PM

## 2014-01-30 NOTE — Telephone Encounter (Signed)
Pt was here today for PT/INR & she asked if she could get a script for 6 bras to [facility].  She has not received a call for f/u with Dr Beryle Beams.  Discussed with Dr Beryle Beams & order faxed to [facility] for 6 bras & 1 prosthesis & Dr. Beryle Beams will work on getting her in to see him in Sept.

## 2014-02-19 ENCOUNTER — Telehealth: Payer: Self-pay | Admitting: Pharmacist

## 2014-03-04 ENCOUNTER — Other Ambulatory Visit: Payer: Self-pay | Admitting: *Deleted

## 2014-03-04 DIAGNOSIS — R894 Abnormal immunological findings in specimens from other organs, systems and tissues: Secondary | ICD-10-CM

## 2014-03-04 DIAGNOSIS — D6859 Other primary thrombophilia: Secondary | ICD-10-CM

## 2014-03-04 MED ORDER — WARFARIN SODIUM 4 MG PO TABS: 6.0000 mg | ORAL_TABLET | Freq: Every day | ORAL | Status: DC

## 2014-03-04 NOTE — Telephone Encounter (Signed)
Phone call - encounter closed. 

## 2014-03-06 ENCOUNTER — Ambulatory Visit (HOSPITAL_BASED_OUTPATIENT_CLINIC_OR_DEPARTMENT_OTHER): Payer: Medicare Other | Admitting: Pharmacist

## 2014-03-06 ENCOUNTER — Other Ambulatory Visit (HOSPITAL_BASED_OUTPATIENT_CLINIC_OR_DEPARTMENT_OTHER): Payer: Medicare Other

## 2014-03-06 DIAGNOSIS — Z7901 Long term (current) use of anticoagulants: Secondary | ICD-10-CM

## 2014-03-06 DIAGNOSIS — D6859 Other primary thrombophilia: Secondary | ICD-10-CM

## 2014-03-06 DIAGNOSIS — D6861 Antiphospholipid syndrome: Secondary | ICD-10-CM

## 2014-03-06 DIAGNOSIS — R894 Abnormal immunological findings in specimens from other organs, systems and tissues: Secondary | ICD-10-CM

## 2014-03-06 DIAGNOSIS — R760 Raised antibody titer: Secondary | ICD-10-CM

## 2014-03-06 LAB — PROTIME-INR
INR: 2 (ref 2.00–3.50)
Protime: 24 Seconds — ABNORMAL HIGH (ref 10.6–13.4)

## 2014-03-06 LAB — POCT INR: INR: 2

## 2014-03-06 NOTE — Progress Notes (Signed)
Dr Beryle Beams pt.  INR remains at goal on current coumadin dose.  No changes in meds and no bleeding.  Increased bruising from moving.  Ms Millward has appt with PCP later this month and may resume a statin.  She will let us know of any med changes after this appt..  Ms Gallardo has appt with Dr. Beryle Beams scheduled for end of September.  Will continue current coumadin dose of 6mg  daily but 8mg  MWF.  Will check PT/INR in 5 weeks.

## 2014-03-21 NOTE — Telephone Encounter (Signed)
Encounter was telephone call. 

## 2014-03-27 ENCOUNTER — Encounter: Payer: Self-pay | Admitting: Pharmacist

## 2014-03-27 NOTE — Progress Notes (Signed)
Ms Lalla left VM that her PCP has adjusted her medications.  Her Wellbutrin was decreased from 200mg  bid to daily.  Also will be adding diazepam.  There is a potential interaction with Wellbutrin and coumadin that INR could increase.  Ms Doughty has a coumadin clinic appt on 04/09/14 and her medication changes and effect on INR will be evaluated at that time.  I left VM with Ms Idleman will this plan.

## 2014-04-09 ENCOUNTER — Ambulatory Visit (HOSPITAL_BASED_OUTPATIENT_CLINIC_OR_DEPARTMENT_OTHER): Payer: Medicare Other | Admitting: Pharmacist

## 2014-04-09 ENCOUNTER — Other Ambulatory Visit: Payer: Medicare Other

## 2014-04-09 ENCOUNTER — Ambulatory Visit: Payer: Medicare Other

## 2014-04-09 ENCOUNTER — Other Ambulatory Visit (HOSPITAL_BASED_OUTPATIENT_CLINIC_OR_DEPARTMENT_OTHER): Payer: Medicare Other

## 2014-04-09 DIAGNOSIS — D6859 Other primary thrombophilia: Secondary | ICD-10-CM

## 2014-04-09 DIAGNOSIS — D6861 Antiphospholipid syndrome: Secondary | ICD-10-CM

## 2014-04-09 DIAGNOSIS — R894 Abnormal immunological findings in specimens from other organs, systems and tissues: Secondary | ICD-10-CM

## 2014-04-09 DIAGNOSIS — Z7901 Long term (current) use of anticoagulants: Secondary | ICD-10-CM

## 2014-04-09 DIAGNOSIS — R760 Raised antibody titer: Secondary | ICD-10-CM

## 2014-04-09 DIAGNOSIS — C50911 Malignant neoplasm of unspecified site of right female breast: Secondary | ICD-10-CM

## 2014-04-09 DIAGNOSIS — I742 Embolism and thrombosis of arteries of the upper extremities: Secondary | ICD-10-CM

## 2014-04-09 LAB — COMPREHENSIVE METABOLIC PANEL (CC13)
ALT: 13 U/L (ref 0–55)
AST: 15 U/L (ref 5–34)
Albumin: 3.6 g/dL (ref 3.5–5.0)
Alkaline Phosphatase: 101 U/L (ref 40–150)
Anion Gap: 9 mEq/L (ref 3–11)
BUN: 16.8 mg/dL (ref 7.0–26.0)
CO2: 24 mEq/L (ref 22–29)
Calcium: 8.7 mg/dL (ref 8.4–10.4)
Chloride: 107 mEq/L (ref 98–109)
Creatinine: 1.4 mg/dL — ABNORMAL HIGH (ref 0.6–1.1)
Glucose: 131 mg/dl (ref 70–140)
Potassium: 3.9 mEq/L (ref 3.5–5.1)
Sodium: 140 mEq/L (ref 136–145)
Total Bilirubin: 0.41 mg/dL (ref 0.20–1.20)
Total Protein: 6.6 g/dL (ref 6.4–8.3)

## 2014-04-09 LAB — CBC WITH DIFFERENTIAL/PLATELET
BASO%: 0.6 % (ref 0.0–2.0)
Basophils Absolute: 0 10*3/uL (ref 0.0–0.1)
EOS%: 4.4 % (ref 0.0–7.0)
Eosinophils Absolute: 0.3 10*3/uL (ref 0.0–0.5)
HCT: 34.9 % (ref 34.8–46.6)
HGB: 11.6 g/dL (ref 11.6–15.9)
LYMPH%: 31.6 % (ref 14.0–49.7)
MCH: 31.1 pg (ref 25.1–34.0)
MCHC: 33.2 g/dL (ref 31.5–36.0)
MCV: 93.6 fL (ref 79.5–101.0)
MONO#: 0.3 10*3/uL (ref 0.1–0.9)
MONO%: 5 % (ref 0.0–14.0)
NEUT#: 3.7 10*3/uL (ref 1.5–6.5)
NEUT%: 58.4 % (ref 38.4–76.8)
Platelets: 219 10*3/uL (ref 145–400)
RBC: 3.73 10*6/uL (ref 3.70–5.45)
RDW: 13.6 % (ref 11.2–14.5)
WBC: 6.4 10*3/uL (ref 3.9–10.3)
lymph#: 2 10*3/uL (ref 0.9–3.3)

## 2014-04-09 LAB — PROTIME-INR
INR: 4.2 — ABNORMAL HIGH (ref 2.00–3.50)
Protime: 50.4 Seconds — ABNORMAL HIGH (ref 10.6–13.4)

## 2014-04-09 LAB — POCT INR: INR: 4.2

## 2014-04-09 NOTE — Progress Notes (Signed)
**  Dr Beryle Beams pt**  INR above goal today.  CBC wnl.  No bleeding or bruising.  Ms Inthavong did not decrease her bupropion dose so this is NOT the reason for increased INR.  No changes in meds.  Will hold today's coumadin dose and slightly decrease dose to 8mg  M/F and 6mg  other days.  Ms Groseclose INR had been very stable on coumadin 6mg  daily at beginning of year.  Ms Cannan has appt with Dr. Beryle Beams on 04/23/14.  Will check PT/INR at that time.

## 2014-04-16 ENCOUNTER — Other Ambulatory Visit: Payer: Medicare Other

## 2014-04-16 ENCOUNTER — Ambulatory Visit: Payer: Medicare Other | Admitting: Nurse Practitioner

## 2014-04-23 ENCOUNTER — Ambulatory Visit (HOSPITAL_COMMUNITY)
Admission: RE | Admit: 2014-04-23 | Discharge: 2014-04-23 | Disposition: A | Discharge status: Home / Self Care | Payer: Medicare Other | Source: Ambulatory Visit | Attending: Oncology | Admitting: Oncology

## 2014-04-23 ENCOUNTER — Encounter: Payer: Self-pay | Admitting: Oncology

## 2014-04-23 ENCOUNTER — Ambulatory Visit: Payer: Medicare Other | Admitting: Pharmacist

## 2014-04-23 ENCOUNTER — Ambulatory Visit (INDEPENDENT_AMBULATORY_CARE_PROVIDER_SITE_OTHER): Payer: Medicare Other | Admitting: Oncology

## 2014-04-23 VITALS — BP 135/68 | HR 71 | Temp 98.1°F | Ht 65.0 in | Wt 186.7 lb

## 2014-04-23 DIAGNOSIS — M79662 Pain in left lower leg: Secondary | ICD-10-CM

## 2014-04-23 DIAGNOSIS — D6861 Antiphospholipid syndrome: Secondary | ICD-10-CM

## 2014-04-23 DIAGNOSIS — D6859 Other primary thrombophilia: Secondary | ICD-10-CM

## 2014-04-23 DIAGNOSIS — M7989 Other specified soft tissue disorders: Secondary | ICD-10-CM | POA: Insufficient documentation

## 2014-04-23 DIAGNOSIS — Z86718 Personal history of other venous thrombosis and embolism: Secondary | ICD-10-CM

## 2014-04-23 DIAGNOSIS — R760 Raised antibody titer: Secondary | ICD-10-CM

## 2014-04-23 LAB — POCT INR
INR: 3.6
INR: 3.6

## 2014-04-23 LAB — D-DIMER, QUANTITATIVE (NOT AT ARMC): D-Dimer, Quant: <0.27 ug/mL-FEU (ref 0.00–0.48)

## 2014-04-23 NOTE — Patient Instructions (Signed)
To lab today Venous doppler left leg today to look for possible blood clot Return visit with Dr Darnell Level in 6 months Next mammograms due end of December

## 2014-04-23 NOTE — Progress Notes (Addendum)
*  PRELIMINARY RESULTS* Vascular Ultrasound Left lower extremity venous duplex has been completed.  Preliminary findings: no evidence of DVT or superficial thrombosis.  Called Dr. Beryle Beams to give results.   Landry Mellow, RDMS, RVT  04/23/2014, 12:43 PM

## 2014-04-23 NOTE — Progress Notes (Signed)
Patient ID: AGAM TUOHY, female   DOB: 05-20-1946, 68 y.o.   MRN: [id number] Hematology and Oncology Follow Up Visit  [name] [id number] [birth year] 68 y.o. 04/23/2014 11:55 AM   Principle Diagnosis: Encounter Diagnoses  Name Primary?  . History of DVT (deep vein thrombosis) Yes  . Antiphospholipid antibody syndrome      Interim History:   Followup visit for this 68 year old woman who has a history of an arterial embolus to one of the fingers of her left hand in October 1998 as the first sign of antiphospholipid antibody syndrome. She has been on chronic Coumadin anticoagulation with no subsequent thrombotic events.   She developed a stage I, ER-positive cancer of the right breast in December 1999, treated with lumpectomy, radiation, and then hormonal therapy with tamoxifen. She developed a large hematoma at the lumpectomy site which then organized into dense tissue which had to be excised. She still has a dense scar in the right breast.   She had an upper endoscopy and colonoscopy by Dr. Juanita Craver on 03/09/2011. There was some question of Barrett esophagus in the past, reported by another gastroenterologist. However, Dr. Collene Mares did not find any evidence of this on her study . There were changes from prior Nissen fundoplication. There was a questionable small patch of Barrett esophagus above the Z-line which was biopsied. Biopsies returned showing focal mild active esophagitis but no evidence for Barrett esophagus. She was started on an antireflux regimen. Unfortunately, the previous Nissen fundoplication broke down and she required a repair procedure on 12/12/2012 by Dr. Johnathan Hausen.  Over this past weekend she developed swelling and pain in both of her ankles with some localized areas of erythema left greater than right.  Her INR was running high last week when checked it was 4.2 on Coumadin 8 mg 3 times a week and 6 mg other days of the week. She was instructed to decrease  the Coumadin to 8 mg on Mondays and Fridays and continue 6 mg daily on the other days of the week. Repeat INR today is pending.  Despite the above complaint she is in good spirits today. Her daughter will be moving closer to home soon from [place] to [place] and her son recently moved to [place].    Medications: reviewed  Allergies:  Allergies  Allergen Reactions  . Vitamin K Anaphylaxis and Rash    "kills me" iv  . Benadryl [Diphenhydramine Hcl]     Rash  . Crestor [Rosuvastatin Calcium]     Muscle Aches  . Daypro [Oxaprozin]     Rash  . Doxycycline     Tongue burning, thrush  . Hornet Venom     Anaphylaxis Shock  . Levofloxacin   . Levofloxacin     Rash  . Reglan [Metoclopramide] Other (See Comments)    Tongue got numb; didn't feel good  . Zocor [Simvastatin]     Muscle Aches  . Sulfonamide Derivatives Rash    Review of Systems: See HPI She has noted no new breast lumps. She had a breast exam by the nurse practitioner who works with her primary care physician 2 months ago. Next mammogram is due in December 2015 Remaining ROS negative:   Physical Exam: Blood pressure 135/68, pulse 71, temperature 98.1 F (36.7 C), temperature source Oral, height 5\' 5"  (1.651 m), weight 186 lb 11.2 oz (84.687 kg), SpO2 96.00%. Wt Readings from Last 3 Encounters:  04/23/14 186 lb 11.2 oz (84.687 kg)  12/26/13 178  lb (80.74 kg)  11/29/13 175 lb (79.379 kg)     General appearance: well nourished Caucasian female HENNT: Pharynx no erythema, exudate, mass, or ulcer. No thyromegaly or thyroid nodules Lymph nodes: No cervical, supraclavicular, or axillary lymphadenopathy Breasts: Lungs: Clear to auscultation, resonant to percussion throughout Heart: Regular rhythm, no murmur, no gallop, no rub, no click, no edema Abdomen: Soft, nontender, normal bowel sounds, no mass, no organomegaly Extremities: 1+ bipedal edema; tender on palpation along left femoral canal  leg measurements:  Calf: 36 cm left, 37 cm right. Ankle 22 cm right & left Musculoskeletal: no joint deformities GU:  Vascular: Carotid pulses 2+, no bruits, distal pulses: Dorsalis pedis 2+ left, 1+ right Neurologic: Alert, oriented, PERRLA, cranial nerves grossly normal, motor strength 5 over 5, reflexes 1+ symmetric, upper body coordination normal, gait normal, Skin: No rash or ecchymosis  Lab Results: CBC W/Diff    Component Value Date/Time   WBC 6.4 04/09/2014 1505   WBC 5.6 12/14/2012 0456   RBC 3.73 04/09/2014 1505   RBC 3.35* 12/14/2012 0456   HGB 11.6 04/09/2014 1505   HGB 10.1* 12/14/2012 0456   HCT 34.9 04/09/2014 1505   HCT 31.6* 12/14/2012 0456   PLT 219 04/09/2014 1505   PLT 170 12/14/2012 0456   MCV 93.6 04/09/2014 1505   MCV 94.3 12/14/2012 0456   MCH 31.1 04/09/2014 1505   MCH 30.1 12/14/2012 0456   MCHC 33.2 04/09/2014 1505   MCHC 32.0 12/14/2012 0456   RDW 13.6 04/09/2014 1505   RDW 13.7 12/14/2012 0456   LYMPHSABS 2.0 04/09/2014 1505   LYMPHSABS 2.0 12/14/2012 0456   MONOABS 0.3 04/09/2014 1505   MONOABS 0.4 12/14/2012 0456   EOSABS 0.3 04/09/2014 1505   EOSABS 0.2 12/14/2012 0456   BASOSABS 0.0 04/09/2014 1505   BASOSABS 0.0 12/14/2012 0456     Chemistry      Component Value Date/Time   NA 140 04/09/2014 1513   NA 137 12/06/2012 1355   K 3.9 04/09/2014 1513   K 4.6 12/06/2012 1355   CL 99 12/06/2012 1355   CL 102 10/19/2012 1301   CO2 24 04/09/2014 1513   CO2 29 12/06/2012 1355   BUN 16.8 04/09/2014 1513   BUN 18 12/06/2012 1355   CREATININE 1.4* 04/09/2014 1513   CREATININE 0.95 12/12/2012 2012      Component Value Date/Time   CALCIUM 8.7 04/09/2014 1513   CALCIUM 9.8 12/06/2012 1355   ALKPHOS 101 04/09/2014 1513   ALKPHOS 103 07/27/2012 2235   AST 15 04/09/2014 1513   AST 21 07/27/2012 2235   ALT 13 04/09/2014 1513   ALT 17 07/27/2012 2235   BILITOT 0.41 04/09/2014 1513   BILITOT 0.3 07/27/2012 2235    INR: 3.6 today D.-dimer: Less than 0.27   Impression:  #1. 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 She continues on chronic Coumadin anticoagulation. I doubt she is having a acute thrombotic event to explain her bipedal edema and tenderness along the left femoral canal with current therapeutic Coumadin level. However I did go ahead and get a Doppler study which returned negative for any acute thrombotic event. This is also supported by the undetectable D.-dimer level.  #2. Stage I, ER-positive, breast cancer treated as outlined above. No evidence for recurrence now out almost 16 years.She is reminded to keep her appointment for her mammograms in December.  #3. Status post redo Nissen fundoplication for hiatus  hernia  #4. Chronic anxiety and depression.   CC: Patient Care Team: [name], PA-C as PCP - General (Family Medicine)   Annia Belt, MD 9/29/201511:55 AM

## 2014-04-23 NOTE — Progress Notes (Signed)
INR slightly above goal today. Pt took coumadin as instructed at last visit. No extra coumadin doses. No changes in medications or diet. Pt has swelling in her left ankle and had doppler done today.Negative for DVT. No unusual bruising or bleeding. Pt examined by Dr. Beryle Beams today. No coumadin today.   Decrease Coumadin 6mg  daily except 8mg  on Mondays.  Recheck INR in 2 weeks on 05/07/14: lab at 11:30am and coumadin clinic at 11:45am. Spoke to patient by telephone. No charge encounter.

## 2014-04-23 NOTE — Patient Instructions (Addendum)
No coumadin today.   Decrease Coumadin 6mg  daily except 8mg  on Mondays.  Recheck INR in 2 weeks on 05/07/14: lab at 11:30am and coumadin clinic at 11:45am.

## 2014-04-25 ENCOUNTER — Telehealth: Payer: Self-pay | Admitting: *Deleted

## 2014-04-25 NOTE — Telephone Encounter (Signed)
Pt called/informed Doppler study is negative for blood clot; INR is 3.6. And needs to stay on current dose of Coumadin and repeat PT/INR next Tuesday. States coumadin clinic has her on 6mg  and next appt is Oct 13.

## 2014-04-25 NOTE — Telephone Encounter (Signed)
Talked to Dr Beryle Beams - stated to keep her appt on the 13th. Called pt back and informed her to keep appt on the 13th at the [facility]; voiced understanding.

## 2014-04-25 NOTE — Telephone Encounter (Signed)
Message copied by Ebbie Latus on Thu Apr 25, 2014  9:29 AM ------      Message from: Annia Belt      Created: Tue Apr 23, 2014  6:03 PM       Call pt: doppler negative for blood clot.      INR 3.6.  Since coumadin dose just changed, stay on current dose but need to repeat PT/INR at cancer center again next Tuesday. ------

## 2014-05-07 ENCOUNTER — Other Ambulatory Visit (HOSPITAL_BASED_OUTPATIENT_CLINIC_OR_DEPARTMENT_OTHER): Payer: Medicare Other

## 2014-05-07 ENCOUNTER — Ambulatory Visit (HOSPITAL_BASED_OUTPATIENT_CLINIC_OR_DEPARTMENT_OTHER): Payer: Medicare Other | Admitting: Pharmacist

## 2014-05-07 DIAGNOSIS — Z7901 Long term (current) use of anticoagulants: Secondary | ICD-10-CM

## 2014-05-07 DIAGNOSIS — R760 Raised antibody titer: Secondary | ICD-10-CM

## 2014-05-07 DIAGNOSIS — D6861 Antiphospholipid syndrome: Secondary | ICD-10-CM

## 2014-05-07 DIAGNOSIS — R790 Abnormal level of blood mineral: Secondary | ICD-10-CM

## 2014-05-07 LAB — PROTIME-INR
INR: 2.5 (ref 2.00–3.50)
Protime: 30 Seconds — ABNORMAL HIGH (ref 10.6–13.4)

## 2014-05-07 LAB — POCT INR: INR: 2.5

## 2014-05-07 NOTE — Progress Notes (Signed)
INR = 2.5 Pt held Coumadin dose on 04/23/14 as directed & then has taken Coumadin 6 mg/day except 8 mg on Mondays. No med changes.  She occasionally uses pseudoephedrine for sinus drainage. She is seeing Dr. Collene Mares 06/07/14 - possibly may repeat endoscopy since pt still having swallowing difficulty and hoarseness. INR at goal.  No change to Coumadin dose. Return in 4 weeks. Kennith Center, Pharm.D., CPP 05/07/2014@11 :35 AM

## 2014-05-23 ENCOUNTER — Other Ambulatory Visit: Payer: Self-pay | Admitting: Oncology

## 2014-05-23 DIAGNOSIS — Z853 Personal history of malignant neoplasm of breast: Secondary | ICD-10-CM

## 2014-05-23 DIAGNOSIS — N644 Mastodynia: Secondary | ICD-10-CM

## 2014-06-04 ENCOUNTER — Other Ambulatory Visit (HOSPITAL_BASED_OUTPATIENT_CLINIC_OR_DEPARTMENT_OTHER): Payer: Medicare Other

## 2014-06-04 ENCOUNTER — Ambulatory Visit (HOSPITAL_BASED_OUTPATIENT_CLINIC_OR_DEPARTMENT_OTHER): Payer: Medicare Other | Admitting: Pharmacist

## 2014-06-04 DIAGNOSIS — D6852 Prothrombin gene mutation: Secondary | ICD-10-CM

## 2014-06-04 DIAGNOSIS — Z7901 Long term (current) use of anticoagulants: Secondary | ICD-10-CM

## 2014-06-04 DIAGNOSIS — R760 Raised antibody titer: Secondary | ICD-10-CM

## 2014-06-04 DIAGNOSIS — D6861 Antiphospholipid syndrome: Secondary | ICD-10-CM

## 2014-06-04 LAB — POCT INR: INR: 3.6

## 2014-06-04 LAB — PROTIME-INR
INR: 3.6 — ABNORMAL HIGH (ref 2.00–3.50)
Protime: 43.2 Seconds — ABNORMAL HIGH (ref 10.6–13.4)

## 2014-06-04 NOTE — Progress Notes (Signed)
Pt seen in clinic today INR=3.6 Pt has had three supratherapuetic INR in last few visits, with no addl changes to report Will decrease her dose back to 42mg  weekly, 6mg  daily RTC in 2 weeks, 11/24 at 11am and 11:15 for CC  Pt states her tablets are starting to get "hung up" in her throat again.  She plans to have this looked at by her MD that performed last surgery.

## 2014-06-04 NOTE — Patient Instructions (Signed)
Change Coumadin dose to 6mg  daily.  Recheck INR on 06/18/14: lab at 11:00am and coumadin clinic at 11:15am.

## 2014-06-06 ENCOUNTER — Telehealth: Payer: Self-pay | Admitting: Pharmacist

## 2014-06-06 NOTE — Telephone Encounter (Signed)
[name] called to notify us that she will be having an endoscopy on Monday 06/10/14 at 1pm. She requested instructions for her Coumadin dosing. I spoke with Dr. Beryle Beams and his instructions are as follows: She is to stop Coumadin 3 days prior to procedure (none on Fri, Sat,Sun). She will not need Lovenox bridging. She will resume Coumadin Monday evening after the procedure.  Called and gave [name] these instructions, she states understanding.  She already has a Coumadin clinic visit scheduled 06/18/14.

## 2014-06-18 ENCOUNTER — Ambulatory Visit (HOSPITAL_BASED_OUTPATIENT_CLINIC_OR_DEPARTMENT_OTHER): Payer: Medicare Other | Admitting: Pharmacist

## 2014-06-18 ENCOUNTER — Other Ambulatory Visit (HOSPITAL_BASED_OUTPATIENT_CLINIC_OR_DEPARTMENT_OTHER): Payer: Medicare Other

## 2014-06-18 DIAGNOSIS — I742 Embolism and thrombosis of arteries of the upper extremities: Secondary | ICD-10-CM

## 2014-06-18 DIAGNOSIS — D6861 Antiphospholipid syndrome: Secondary | ICD-10-CM

## 2014-06-18 DIAGNOSIS — R790 Abnormal level of blood mineral: Secondary | ICD-10-CM

## 2014-06-18 DIAGNOSIS — R760 Raised antibody titer: Secondary | ICD-10-CM

## 2014-06-18 DIAGNOSIS — Z7901 Long term (current) use of anticoagulants: Secondary | ICD-10-CM

## 2014-06-18 LAB — PROTIME-INR
INR: 2.5 (ref 2.00–3.50)
Protime: 30 Seconds — ABNORMAL HIGH (ref 10.6–13.4)

## 2014-06-18 LAB — POCT INR: INR: 2.5

## 2014-06-18 NOTE — Patient Instructions (Signed)
Coumadin dose to 6mg  daily.  Recheck INR on 07/12/14: lab at 11:00am and coumadin clinic at 11:15am.

## 2014-06-18 NOTE — Progress Notes (Signed)
PT seen in clinic today S/P throat procedure involving a "balloon to stretch her esophagus" She has been instructed to not drink any hot or cold drinks per MD No new meds, diet same other than drinks mentioned above  Coumadin dose to 6mg  daily.  Recheck INR on 07/12/14: lab at 11:00am and coumadin clinic at 11:15am.

## 2014-07-12 ENCOUNTER — Other Ambulatory Visit (HOSPITAL_BASED_OUTPATIENT_CLINIC_OR_DEPARTMENT_OTHER): Payer: Medicare Other

## 2014-07-12 ENCOUNTER — Ambulatory Visit (HOSPITAL_BASED_OUTPATIENT_CLINIC_OR_DEPARTMENT_OTHER): Payer: Medicare Other | Admitting: Pharmacist

## 2014-07-12 DIAGNOSIS — D6852 Prothrombin gene mutation: Secondary | ICD-10-CM

## 2014-07-12 DIAGNOSIS — D6861 Antiphospholipid syndrome: Secondary | ICD-10-CM

## 2014-07-12 DIAGNOSIS — R790 Abnormal level of blood mineral: Secondary | ICD-10-CM

## 2014-07-12 DIAGNOSIS — R760 Raised antibody titer: Secondary | ICD-10-CM

## 2014-07-12 DIAGNOSIS — Z7901 Long term (current) use of anticoagulants: Secondary | ICD-10-CM

## 2014-07-12 LAB — PROTIME-INR
INR: 3 (ref 2.00–3.50)
Protime: 36 Seconds — ABNORMAL HIGH (ref 10.6–13.4)

## 2014-07-12 LAB — POCT INR: INR: 3

## 2014-07-12 NOTE — Progress Notes (Signed)
INR at goal Pt doing well today with no complaints No medication or diet changes No unusual bleeding or bruising No missed or extra doses 6mg  daily appears to be working well for [name] Will monitor for any trends upward in INR as she is at top end of goal range Plan: No changes Coumadin dose to 6mg  daily.  Recheck INR on 08/06/14: lab at 11:30am and coumadin clinic at 11:45am.

## 2014-07-12 NOTE — Patient Instructions (Signed)
INR at goal No changes Coumadin dose to 6mg  daily.  Recheck INR on 08/06/14: lab at 11:30am and coumadin clinic at 11:45am.

## 2014-07-22 ENCOUNTER — Other Ambulatory Visit: Payer: Self-pay | Admitting: Oncology

## 2014-07-22 ENCOUNTER — Ambulatory Visit
Admission: RE | Admit: 2014-07-22 | Discharge: 2014-07-22 | Disposition: A | Discharge status: Home / Self Care | Payer: Medicare Other | Source: Ambulatory Visit | Attending: Oncology | Admitting: Oncology

## 2014-07-22 DIAGNOSIS — N644 Mastodynia: Secondary | ICD-10-CM

## 2014-07-22 DIAGNOSIS — Z853 Personal history of malignant neoplasm of breast: Secondary | ICD-10-CM

## 2014-07-29 ENCOUNTER — Telehealth: Payer: Self-pay | Admitting: Pharmacist

## 2014-07-29 NOTE — Telephone Encounter (Signed)
Pt called to let us know she was put on z-pak, prednisone taper and hydrocodone elixer for cough. She is due back for her next INR check on 08/06/13.   I told her some of these meds could interact with coumadin, but we will still wait to see her on 08/06/13

## 2014-08-06 ENCOUNTER — Other Ambulatory Visit (HOSPITAL_BASED_OUTPATIENT_CLINIC_OR_DEPARTMENT_OTHER): Payer: Medicare HMO

## 2014-08-06 ENCOUNTER — Ambulatory Visit (HOSPITAL_BASED_OUTPATIENT_CLINIC_OR_DEPARTMENT_OTHER): Payer: Medicare HMO | Admitting: Pharmacist

## 2014-08-06 DIAGNOSIS — D6861 Antiphospholipid syndrome: Secondary | ICD-10-CM

## 2014-08-06 DIAGNOSIS — Z7901 Long term (current) use of anticoagulants: Secondary | ICD-10-CM

## 2014-08-06 DIAGNOSIS — R760 Raised antibody titer: Secondary | ICD-10-CM

## 2014-08-06 LAB — PROTIME-INR
INR: 3.8 — ABNORMAL HIGH (ref 2.00–3.50)
Protime: 45.6 Seconds — ABNORMAL HIGH (ref 10.6–13.4)

## 2014-08-06 LAB — POCT INR: INR: 3.8

## 2014-08-06 NOTE — Patient Instructions (Signed)
INR above goal No coumadin today then continue Coumadin  6mg  daily.  Recheck INR on 08/27/14: lab at 11:30am and coumadin clinic at 11:45am.

## 2014-08-06 NOTE — Progress Notes (Signed)
INR above goal today at 3.8 (goal 2-3) Pt is doing well today She is still getting over her cold/cough from last week She has completed prednisone taper and z-pak These have probably resulted in her INR being elevated this morning Other than this no other changes No unusual bleeding or bruising No missed or extra doses Diet and appetite are stable Plans: No coumadin today then continue Coumadin  6mg  daily.  Recheck INR on 08/27/14: lab at 11:30am and coumadin clinic at 11:45am.

## 2014-08-27 ENCOUNTER — Other Ambulatory Visit (HOSPITAL_BASED_OUTPATIENT_CLINIC_OR_DEPARTMENT_OTHER): Payer: Medicare HMO

## 2014-08-27 ENCOUNTER — Ambulatory Visit (HOSPITAL_BASED_OUTPATIENT_CLINIC_OR_DEPARTMENT_OTHER): Payer: Medicare HMO | Admitting: Pharmacist

## 2014-08-27 DIAGNOSIS — R760 Raised antibody titer: Secondary | ICD-10-CM

## 2014-08-27 DIAGNOSIS — D6861 Antiphospholipid syndrome: Secondary | ICD-10-CM | POA: Diagnosis not present

## 2014-08-27 DIAGNOSIS — Z7901 Long term (current) use of anticoagulants: Secondary | ICD-10-CM

## 2014-08-27 LAB — PROTIME-INR
INR: 1.6 — ABNORMAL LOW (ref 2.00–3.50)
Protime: 19.2 Seconds — ABNORMAL HIGH (ref 10.6–13.4)

## 2014-08-27 LAB — POCT INR: INR: 1.6

## 2014-08-27 NOTE — Progress Notes (Signed)
INR = 1.6   Goal 2-3 INR is below goal range. No missed doses of Coumadin. No dietary changes. Patient states that the only change has been that she is under increased taking care of her mother who suffers from dementia. Since INR is low today, would like to see her sooner than usual. Will give Coumadin 8 mg today then continue Coumadin  6mg  daily.  Recheck INR on 09/02/14: lab at 11:30am and Coumadin clinic at 11:45am.  Theone Murdoch, PharmD

## 2014-09-02 ENCOUNTER — Ambulatory Visit (HOSPITAL_BASED_OUTPATIENT_CLINIC_OR_DEPARTMENT_OTHER): Payer: Medicare HMO | Admitting: Pharmacist

## 2014-09-02 ENCOUNTER — Other Ambulatory Visit (HOSPITAL_BASED_OUTPATIENT_CLINIC_OR_DEPARTMENT_OTHER): Payer: Medicare HMO

## 2014-09-02 DIAGNOSIS — R760 Raised antibody titer: Secondary | ICD-10-CM

## 2014-09-02 DIAGNOSIS — D6861 Antiphospholipid syndrome: Secondary | ICD-10-CM | POA: Diagnosis not present

## 2014-09-02 DIAGNOSIS — Z7901 Long term (current) use of anticoagulants: Secondary | ICD-10-CM

## 2014-09-02 LAB — POCT INR: INR: 2.1

## 2014-09-02 LAB — PROTIME-INR
INR: 2.1 (ref 2.00–3.50)
Protime: 25.2 Seconds — ABNORMAL HIGH (ref 10.6–13.4)

## 2014-09-02 NOTE — Patient Instructions (Signed)
INR at goal  No changes Continue Coumadin  6mg  daily.  Recheck INR on 09/24/14: lab at 11:30am and coumadin clinic at 11:45am

## 2014-09-02 NOTE — Progress Notes (Signed)
INR at goal  *Pt of Dr. Beryle Beams* Pt is doing well with no compalints No unusual bleeding or bruising Pt took 8 mg as instructed x 1 dose last week and then resumed 6 mg daily No other changes to report regarding medications or diet Patient's mother is staying with her sister this week so [name] can get some work done around the house Plans: No changes Continue Coumadin  6mg  daily.  Recheck INR on 09/24/14: lab at 11:30am and coumadin clinic at 11:45am

## 2014-09-12 ENCOUNTER — Other Ambulatory Visit: Payer: Self-pay | Admitting: Gastroenterology

## 2014-09-12 DIAGNOSIS — Z86010 Personal history of colonic polyps: Secondary | ICD-10-CM

## 2014-09-12 DIAGNOSIS — K5730 Diverticulosis of large intestine without perforation or abscess without bleeding: Secondary | ICD-10-CM

## 2014-09-16 ENCOUNTER — Telehealth: Payer: Self-pay | Admitting: Pharmacist

## 2014-09-16 DIAGNOSIS — Z7901 Long term (current) use of anticoagulants: Secondary | ICD-10-CM

## 2014-09-16 DIAGNOSIS — D6859 Other primary thrombophilia: Secondary | ICD-10-CM

## 2014-09-16 MED ORDER — WARFARIN SODIUM 4 MG PO TABS: ORAL_TABLET | ORAL | Status: DC

## 2014-09-16 NOTE — Telephone Encounter (Signed)
Pt called to report she only has 4-5 days left of her warfarin 4 mg tabs at home.  She is using [employer] order (ph# [phone]) and they take 7-10 days to ship to her house. I called in [facility] [place] in order to get her through until her [employer] supply arrived.  Pt states she is willing to pay [facility] of pocket at [facility] if her insurance doesn't allow this RX.  NOTE: she is having a "barium colonoscopy" this Fri (2/26) and she did not think she needed to come off Coumadin for this.  I confirmed this is correct since this is a low risk procedure.  Kennith Center, Pharm.D., CPP 09/16/2014@10 :28 AM

## 2014-09-20 ENCOUNTER — Ambulatory Visit
Admission: RE | Admit: 2014-09-20 | Discharge: 2014-09-20 | Disposition: A | Discharge status: Home / Self Care | Payer: Commercial Managed Care - HMO | Source: Ambulatory Visit | Attending: Gastroenterology | Admitting: Gastroenterology

## 2014-09-20 DIAGNOSIS — Z860100 Personal history of colon polyps, unspecified: Secondary | ICD-10-CM

## 2014-09-20 DIAGNOSIS — K5730 Diverticulosis of large intestine without perforation or abscess without bleeding: Secondary | ICD-10-CM

## 2014-09-24 ENCOUNTER — Ambulatory Visit: Payer: Commercial Managed Care - HMO

## 2014-09-24 ENCOUNTER — Other Ambulatory Visit: Payer: Commercial Managed Care - HMO

## 2014-09-25 ENCOUNTER — Other Ambulatory Visit (HOSPITAL_BASED_OUTPATIENT_CLINIC_OR_DEPARTMENT_OTHER): Payer: Medicare HMO

## 2014-09-25 ENCOUNTER — Ambulatory Visit (HOSPITAL_BASED_OUTPATIENT_CLINIC_OR_DEPARTMENT_OTHER): Payer: Medicare HMO | Admitting: Pharmacist

## 2014-09-25 DIAGNOSIS — R790 Abnormal level of blood mineral: Secondary | ICD-10-CM | POA: Diagnosis not present

## 2014-09-25 DIAGNOSIS — Z7901 Long term (current) use of anticoagulants: Secondary | ICD-10-CM

## 2014-09-25 DIAGNOSIS — R760 Raised antibody titer: Secondary | ICD-10-CM

## 2014-09-25 DIAGNOSIS — D6861 Antiphospholipid syndrome: Secondary | ICD-10-CM

## 2014-09-25 LAB — PROTIME-INR
INR: 3.4 (ref 2.00–3.50)
Protime: 40.8 Seconds — ABNORMAL HIGH (ref 10.6–13.4)

## 2014-09-25 LAB — POCT INR: INR: 3.4

## 2014-09-25 NOTE — Progress Notes (Signed)
INR = 3.4 on Coumadin 6 mg daily Had barium colonscopy on 09/20/14.  Results pending. No bleeding/bruising. She sees her PCP in April for complete physical & labs. INR slightly elevated but previously therapeutic on current dose of Coumadin. Pt will take just 2 mg of Coumadin today then resume 6 mg daily Return in 3 weeks. Pt will call if she goes on abx for ? Sinus inf. Kennith Center, Pharm.D., CPP 09/25/2014@12 :12 PM

## 2014-10-18 ENCOUNTER — Ambulatory Visit (HOSPITAL_BASED_OUTPATIENT_CLINIC_OR_DEPARTMENT_OTHER): Payer: Medicare HMO | Admitting: Pharmacist

## 2014-10-18 ENCOUNTER — Other Ambulatory Visit (HOSPITAL_BASED_OUTPATIENT_CLINIC_OR_DEPARTMENT_OTHER): Payer: Medicare HMO

## 2014-10-18 DIAGNOSIS — D6861 Antiphospholipid syndrome: Secondary | ICD-10-CM | POA: Diagnosis not present

## 2014-10-18 DIAGNOSIS — R790 Abnormal level of blood mineral: Secondary | ICD-10-CM

## 2014-10-18 DIAGNOSIS — I2699 Other pulmonary embolism without acute cor pulmonale: Secondary | ICD-10-CM

## 2014-10-18 DIAGNOSIS — R760 Raised antibody titer: Secondary | ICD-10-CM

## 2014-10-18 LAB — PROTIME-INR
INR: 2.3 (ref 2.00–3.50)
Protime: 27.6 Seconds — ABNORMAL HIGH (ref 10.6–13.4)

## 2014-10-18 LAB — POCT INR: INR: 2.3

## 2014-10-18 NOTE — Patient Instructions (Signed)
INR at goal No changes Continue Coumadin  6mg  daily.  Recheck INR on 11/15/14: lab at 11 am and coumadin clinic at 11:15 am.

## 2014-10-18 NOTE — Progress Notes (Signed)
INR at goal today at 2.3 (goal 2-3) *Dr. Beryle Beams Patient* Pt is doing well  She is in the process of selling her house to move in with her mother to help with her care Last week Ms. Tozer dropped a piece of wood on her leg resulting in bruising down her leg and swelling in her ankle This is healing nicely but is still quite painful for [name]. This was evaluated by her PCP but no x-rays were performed No missed or extra doses No diet or medication changes Plan: No changes Continue Coumadin  6mg  daily.  Recheck INR on 11/15/14: lab at 11 am and coumadin clinic at 11:15 am.

## 2014-11-15 ENCOUNTER — Ambulatory Visit (HOSPITAL_BASED_OUTPATIENT_CLINIC_OR_DEPARTMENT_OTHER): Payer: Medicare HMO | Admitting: Pharmacist

## 2014-11-15 ENCOUNTER — Other Ambulatory Visit (HOSPITAL_BASED_OUTPATIENT_CLINIC_OR_DEPARTMENT_OTHER): Payer: Medicare HMO

## 2014-11-15 DIAGNOSIS — R790 Abnormal level of blood mineral: Secondary | ICD-10-CM

## 2014-11-15 DIAGNOSIS — I2699 Other pulmonary embolism without acute cor pulmonale: Secondary | ICD-10-CM | POA: Diagnosis not present

## 2014-11-15 DIAGNOSIS — D6861 Antiphospholipid syndrome: Secondary | ICD-10-CM | POA: Diagnosis not present

## 2014-11-15 DIAGNOSIS — R760 Raised antibody titer: Secondary | ICD-10-CM

## 2014-11-15 LAB — PROTIME-INR
INR: 2.5 (ref 2.00–3.50)
Protime: 30 Seconds — ABNORMAL HIGH (ref 10.6–13.4)

## 2014-11-15 LAB — POCT INR: INR: 2.5

## 2014-11-15 NOTE — Progress Notes (Signed)
Pt of Dr. Beryle Beams INR at goal today at 2.5 (goal 2-3) Pt is doing well today with no complaints No unusual bleeding or bruising No missed or extra doses No diet or medication changes Her leg is continuing to heal nicely from her injury last month when a piece of wood fell on her left leg  Plans: No changes Continue Coumadin  6mg  daily.  Recheck INR on 12/10/14: lab at 11 am and coumadin clinic at 11:15 am.

## 2014-11-15 NOTE — Patient Instructions (Signed)
INR at goal No changes Continue Coumadin  6mg  daily.  Recheck INR on 12/10/14: lab at 11 am and coumadin clinic at 11:15 am.

## 2014-12-10 ENCOUNTER — Other Ambulatory Visit (HOSPITAL_BASED_OUTPATIENT_CLINIC_OR_DEPARTMENT_OTHER): Payer: Medicare HMO

## 2014-12-10 ENCOUNTER — Ambulatory Visit (HOSPITAL_BASED_OUTPATIENT_CLINIC_OR_DEPARTMENT_OTHER): Payer: Medicare HMO | Admitting: Pharmacist

## 2014-12-10 DIAGNOSIS — D6861 Antiphospholipid syndrome: Secondary | ICD-10-CM | POA: Diagnosis not present

## 2014-12-10 DIAGNOSIS — R790 Abnormal level of blood mineral: Secondary | ICD-10-CM

## 2014-12-10 DIAGNOSIS — R760 Raised antibody titer: Secondary | ICD-10-CM

## 2014-12-10 DIAGNOSIS — I2699 Other pulmonary embolism without acute cor pulmonale: Secondary | ICD-10-CM

## 2014-12-10 LAB — POCT INR: INR: 3.2

## 2014-12-10 LAB — PROTIME-INR
INR: 3.2 (ref 2.00–3.50)
Protime: 38.4 Seconds — ABNORMAL HIGH (ref 10.6–13.4)

## 2014-12-10 NOTE — Patient Instructions (Signed)
Continue Coumadin  6mg  daily.  Recheck INR on 01/06/15: lab at 11:15 am and coumadin clinic at 11:30 am.

## 2014-12-10 NOTE — Progress Notes (Signed)
Pt seen in [place] today. INR=3.2 Pt states she forgot all her meds last weekend when she went to see her son [facility]  So she did take it and her ASA She is also drinking cucumber/lemon water daily for weight loss Will keep her dose the same as she has not changes to report  Continue Coumadin  6mg  daily.  Recheck INR on 01/06/15: lab at 11:15 am and coumadin clinic at 11:30 am.

## 2015-01-06 ENCOUNTER — Other Ambulatory Visit (HOSPITAL_BASED_OUTPATIENT_CLINIC_OR_DEPARTMENT_OTHER): Payer: Medicare HMO

## 2015-01-06 ENCOUNTER — Ambulatory Visit (HOSPITAL_BASED_OUTPATIENT_CLINIC_OR_DEPARTMENT_OTHER): Payer: Medicare HMO | Admitting: Pharmacist

## 2015-01-06 DIAGNOSIS — I2699 Other pulmonary embolism without acute cor pulmonale: Secondary | ICD-10-CM

## 2015-01-06 DIAGNOSIS — D6861 Antiphospholipid syndrome: Secondary | ICD-10-CM

## 2015-01-06 DIAGNOSIS — R790 Abnormal level of blood mineral: Secondary | ICD-10-CM | POA: Diagnosis not present

## 2015-01-06 DIAGNOSIS — R760 Raised antibody titer: Secondary | ICD-10-CM

## 2015-01-06 LAB — PROTIME-INR
INR: 2.5 (ref 2.00–3.50)
Protime: 30 Seconds — ABNORMAL HIGH (ref 10.6–13.4)

## 2015-01-06 LAB — POCT INR: INR: 2.5

## 2015-01-06 NOTE — Progress Notes (Signed)
INR at goal today at 2.5 (goal 2-3) Pt is doing well today but she did have a fall at her home this weekend She has bruising on her right leg as well as right side of her abdomen and chest area It is painful for her to breathe and she is being evaluated by her Primary care either today or tomorrow with possible x-ray The bruising appears to be healing.  I will not adjust her coumadin but if anything is found at her primary care she will call us and let her know No other issues to report No missed or extra doses No other bruising or bleeding No diet or medication changes Plan: No changes Continue Coumadin  6mg  daily.  Recheck INR on 02/03/15: lab at 11:15 am and coumadin clinic at 11:30 am.

## 2015-01-06 NOTE — Patient Instructions (Signed)
INR at goal No changes Continue Coumadin  6mg  daily.  Recheck INR on 02/03/15: lab at 11:15 am and coumadin clinic at 11:30 am.

## 2015-01-30 ENCOUNTER — Other Ambulatory Visit: Payer: Self-pay | Admitting: *Deleted

## 2015-01-30 DIAGNOSIS — M79604 Pain in right leg: Secondary | ICD-10-CM

## 2015-02-03 ENCOUNTER — Ambulatory Visit (HOSPITAL_BASED_OUTPATIENT_CLINIC_OR_DEPARTMENT_OTHER): Payer: Medicare HMO | Admitting: Pharmacist

## 2015-02-03 ENCOUNTER — Other Ambulatory Visit: Payer: Medicare HMO

## 2015-02-03 DIAGNOSIS — I2699 Other pulmonary embolism without acute cor pulmonale: Secondary | ICD-10-CM

## 2015-02-03 DIAGNOSIS — D6861 Antiphospholipid syndrome: Secondary | ICD-10-CM | POA: Diagnosis not present

## 2015-02-03 DIAGNOSIS — R760 Raised antibody titer: Secondary | ICD-10-CM

## 2015-02-03 LAB — PROTIME-INR
INR: 1.2 — ABNORMAL LOW (ref 2.00–3.50)
Protime: 14.4 Seconds — ABNORMAL HIGH (ref 10.6–13.4)

## 2015-02-03 LAB — POCT INR: INR: 1.2

## 2015-02-03 NOTE — Progress Notes (Signed)
INR below goal today. Unexplained decrease in INR. Pt has been fairly stable on this dose. No missed coumadin doses. No changes in diet or medications. Pt does get her warfarin through the mail. She noticed that some tablets are a lighter color blue than others. No unusual bruising. Some continued blood-tinged phlegm due to a chronic sinus infection. No redness or swelling noted in extremities. No SOB. Pt complains of pain in her leg veins. Mostly in her right thigh (and also left ankle area). The area above her left ankle is at the base of her calf, the skin is slightly darkened, tender at times, with occasional  knots.  Neither area is red, warm or swollen. Pt to see vein specialist in August. Take 8mg  today and on 02/04/15. On 02/05/15, continue coumadin 6mg  daily. Recheck INR in 10 days on 02/13/15; lab at 11:30am and coumadin clinic at 11:45. Last CBC 04/09/14, CBC ordered for review with next visit.

## 2015-02-03 NOTE — Patient Instructions (Signed)
Take 8mg  today and on 02/04/15. On 02/05/15, continue coumadin 6mg  daily. Recheck INR in 10 days on 02/13/15; lab at 11:30am and coumadin clinic at 11:45.

## 2015-02-12 ENCOUNTER — Telehealth: Payer: Self-pay | Admitting: Oncology

## 2015-02-12 ENCOUNTER — Ambulatory Visit (HOSPITAL_BASED_OUTPATIENT_CLINIC_OR_DEPARTMENT_OTHER): Payer: Medicare HMO | Admitting: Pharmacist

## 2015-02-12 ENCOUNTER — Other Ambulatory Visit (HOSPITAL_BASED_OUTPATIENT_CLINIC_OR_DEPARTMENT_OTHER): Payer: Medicare HMO

## 2015-02-12 DIAGNOSIS — R790 Abnormal level of blood mineral: Secondary | ICD-10-CM | POA: Diagnosis not present

## 2015-02-12 DIAGNOSIS — D6861 Antiphospholipid syndrome: Secondary | ICD-10-CM

## 2015-02-12 DIAGNOSIS — I2699 Other pulmonary embolism without acute cor pulmonale: Secondary | ICD-10-CM

## 2015-02-12 DIAGNOSIS — R760 Raised antibody titer: Secondary | ICD-10-CM

## 2015-02-12 LAB — CBC WITH DIFFERENTIAL/PLATELET
BASO%: 0.7 % (ref 0.0–2.0)
Basophils Absolute: 0.1 10*3/uL (ref 0.0–0.1)
EOS%: 3.4 % (ref 0.0–7.0)
Eosinophils Absolute: 0.3 10*3/uL (ref 0.0–0.5)
HCT: 38.8 % (ref 34.8–46.6)
HGB: 13 g/dL (ref 11.6–15.9)
LYMPH%: 35.3 % (ref 14.0–49.7)
MCH: 31.1 pg (ref 25.1–34.0)
MCHC: 33.5 g/dL (ref 31.5–36.0)
MCV: 92.8 fL (ref 79.5–101.0)
MONO#: 0.6 10*3/uL (ref 0.1–0.9)
MONO%: 7 % (ref 0.0–14.0)
NEUT#: 4.5 10*3/uL (ref 1.5–6.5)
NEUT%: 53.6 % (ref 38.4–76.8)
Platelets: 183 10*3/uL (ref 145–400)
RBC: 4.18 10*6/uL (ref 3.70–5.45)
RDW: 14 % (ref 11.2–14.5)
WBC: 8.3 10*3/uL (ref 3.9–10.3)
lymph#: 2.9 10*3/uL (ref 0.9–3.3)
nRBC: 0 % (ref 0–0)

## 2015-02-12 LAB — POCT INR: INR: 2.2

## 2015-02-12 LAB — PROTIME-INR
INR: 2.2 (ref 2.00–3.50)
Protime: 26.4 Seconds — ABNORMAL HIGH (ref 10.6–13.4)

## 2015-02-12 NOTE — Telephone Encounter (Signed)
pt came in to change her appt to today-talked w/Chris in Pharmacy stated ok to change

## 2015-02-12 NOTE — Progress Notes (Signed)
INR at goal today at 2.2 (goal 2-3) INR has now returned to goal after subtherapuetic level 10 days ago No missed or extra doses No diet or medication changes No unusual bleeding or bruising Pt is going to [place] with her son in 2 weeks. She will return on 8/14.  She states she will use compression leggings while on the plane and will get up and walk around periodically. Pt complains of pain in her leg veins. Mostly in her right thigh (and also left ankle area). The area above her left ankle is at the base of her calf, the skin is slightly darkened, tender at times, with occasional knots.  Neither area is red, warm or swollen. Pt was to be seen vein specialist in August but this will need to be rescheduled due to her vacation   Plan: No changes Continue coumadin 6mg  daily. Recheck INR in 1 month on 03/13/15; lab at 11:15am and coumadin clinic at 11:30.

## 2015-02-12 NOTE — Patient Instructions (Addendum)
INR at goal today at 2.2 (goal 2-3) INR has now returned to goal after subtherapuetic level 10 days ago Plan: No changes Continue coumadin 6mg  daily. Recheck INR in 1 month on 03/13/15; lab at 11:15am and coumadin clinic at 11:30.

## 2015-02-13 ENCOUNTER — Other Ambulatory Visit: Payer: Medicare HMO

## 2015-02-13 ENCOUNTER — Ambulatory Visit: Payer: Medicare HMO

## 2015-02-26 ENCOUNTER — Ambulatory Visit: Payer: Medicare HMO | Admitting: Vascular Surgery

## 2015-02-26 ENCOUNTER — Encounter (HOSPITAL_COMMUNITY): Payer: Medicare HMO

## 2015-03-13 ENCOUNTER — Ambulatory Visit (HOSPITAL_BASED_OUTPATIENT_CLINIC_OR_DEPARTMENT_OTHER): Payer: Medicare HMO | Admitting: Pharmacist

## 2015-03-13 ENCOUNTER — Other Ambulatory Visit (HOSPITAL_BASED_OUTPATIENT_CLINIC_OR_DEPARTMENT_OTHER): Payer: Medicare HMO

## 2015-03-13 DIAGNOSIS — I2699 Other pulmonary embolism without acute cor pulmonale: Secondary | ICD-10-CM

## 2015-03-13 DIAGNOSIS — D6861 Antiphospholipid syndrome: Secondary | ICD-10-CM

## 2015-03-13 DIAGNOSIS — R760 Raised antibody titer: Secondary | ICD-10-CM

## 2015-03-13 LAB — PROTIME-INR
INR: 2.5 (ref 2.00–3.50)
Protime: 30 Seconds — ABNORMAL HIGH (ref 10.6–13.4)

## 2015-03-13 LAB — POCT INR
INR: 2.5
INR: 2.5

## 2015-03-13 NOTE — Progress Notes (Signed)
Pt seen in clinic today INR=2.5 on 6 mg daily She has no changes to report I explained the new process she will have to follow for Dr. Darnell Level and his new CC staff Pt will be getting labs done at [facility] on 04/09/15 I gave a written RX for patient to have Pt/INR done with results sent to Dr. Darnell Level She was also given the number to Susette Racer to schedule her next appmt with Dr. Darnell Level and his CC It appears she is overdue for her annual with Dr. Darnell Level, although pt states she has been there in last year an office note was not seen.  No changes to her coumadin today Continue 6mg  daily and have it checked again in one month.

## 2015-03-13 NOTE — Patient Instructions (Signed)
Continue coumadin 6mg  daily. Recheck INR in 1 month on 04/09/15 with [facility] and results should be faxed to Dr. Beryle Beams as ordered

## 2015-04-01 ENCOUNTER — Encounter: Payer: Self-pay | Admitting: Vascular Surgery

## 2015-04-02 ENCOUNTER — Ambulatory Visit (HOSPITAL_COMMUNITY)
Admission: RE | Admit: 2015-04-02 | Discharge: 2015-04-02 | Disposition: A | Discharge status: Home / Self Care | Payer: Medicare HMO | Source: Ambulatory Visit | Attending: Vascular Surgery | Admitting: Vascular Surgery

## 2015-04-02 ENCOUNTER — Ambulatory Visit (INDEPENDENT_AMBULATORY_CARE_PROVIDER_SITE_OTHER): Payer: Medicare HMO | Admitting: Vascular Surgery

## 2015-04-02 ENCOUNTER — Other Ambulatory Visit: Payer: Self-pay | Admitting: *Deleted

## 2015-04-02 ENCOUNTER — Encounter: Payer: Self-pay | Admitting: Vascular Surgery

## 2015-04-02 ENCOUNTER — Ambulatory Visit (INDEPENDENT_AMBULATORY_CARE_PROVIDER_SITE_OTHER)
Admission: RE | Admit: 2015-04-02 | Discharge: 2015-04-02 | Disposition: A | Discharge status: Home / Self Care | Payer: Medicare HMO | Source: Ambulatory Visit | Attending: Vascular Surgery | Admitting: Vascular Surgery

## 2015-04-02 VITALS — BP 133/86 | HR 68 | Temp 97.7°F | Resp 16 | Ht 65.0 in | Wt 186.0 lb

## 2015-04-02 DIAGNOSIS — I70209 Unspecified atherosclerosis of native arteries of extremities, unspecified extremity: Secondary | ICD-10-CM

## 2015-04-02 DIAGNOSIS — M25561 Pain in right knee: Secondary | ICD-10-CM

## 2015-04-02 DIAGNOSIS — M79604 Pain in right leg: Secondary | ICD-10-CM

## 2015-04-02 DIAGNOSIS — I10 Essential (primary) hypertension: Secondary | ICD-10-CM | POA: Insufficient documentation

## 2015-04-02 DIAGNOSIS — E785 Hyperlipidemia, unspecified: Secondary | ICD-10-CM | POA: Diagnosis not present

## 2015-04-02 NOTE — Progress Notes (Signed)
[facility] [place]  Patient name: [name] MRN: [id number] DOB: [birth year] Sex: female  REASON FOR VISIT: follow up of chronic venous insufficiency.  HPI: [name] is a 69 y.o. female who was seen in the office on 12/26/2013 with bilateral lower extremity pain. Arterial Doppler study on 08/23/2013 showed normal ABIs. Lower extremity venous duplex scan on 12/26/2013 showed mild deep vein reflux in the common femoral veins. She was going to follow up as needed.  Since I saw her last, she has developed some pain in the right popliteal fossa which she is had for approximately 2 months. She describes an aching pain behind her knee with some radiation up to her proximal thigh. Of note she does have a history of a hypercoag state (antiphospholipid antibody) and is on chronic Coumadin therapy.she tells me that her Coumadin has been therapeutic. The pain has been fairly constant. She denies any significant swelling in the right lower extremity. Of note she did travel to [place] prior to developing this pain.  Past Medical History  Diagnosis Date  . Chest pain   . Hx-TIA (transient ischemic attack)   . Hypothyroidism   . History of DVT (deep vein thrombosis)     in all fingers  . History of antiphospholipid antibody syndrome   . 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 Arthritis   . Hyperlipidemia   . Neuromuscular disorder   . Complication of anesthesia     "hard to put asleep"  . Stroke   . Breast cancer     right  . Hypertension   . Fibromyalgia   . Hepatitis 1990    "from eating at restaurant"  . Headache(784.0)   . H/O hiatal hernia   . GERD (gastroesophageal reflux disease)   . Asthma     as child  . Pneumonia     hx of  . Collapsed lung     hx of, left  . Depression   . Anxiety     severe panic attacks   Family History  Problem Relation Age of  Onset  . Cancer Maternal Grandmother     colon  . Alzheimer's disease Mother   . Heart disease Mother   . Hyperlipidemia Mother   . Hypertension Mother   . Heart disease Sister   . Hypertension Sister   . Heart attack Sister   . Heart disease Brother   . Heart attack Brother   . Cancer Daughter   . Hypertension Daughter   . Hypertension Son    SOCIAL HISTORY: Social History  Substance Use Topics  . Smoking status: Never Smoker   . Smokeless tobacco: Never Used  . Alcohol Use: No   Allergies  Allergen Reactions  . Vitamin K Anaphylaxis and Rash    "kills me" iv  . Benadryl [Diphenhydramine Hcl]     Rash  . Crestor [Rosuvastatin Calcium]     Muscle Aches  . Daypro [Oxaprozin]     Rash  . Doxycycline     Tongue burning, thrush  . Hornet Venom     Anaphylaxis Shock  . Levofloxacin   . Levofloxacin     Rash  . Reglan [Metoclopramide] Other (See Comments)    Tongue got numb; didn't feel good  . Zocor [Simvastatin]     Muscle Aches  . Sulfonamide Derivatives Rash   Current Outpatient Prescriptions  Medication  Sig Dispense Refill  . aspirin 81 MG tablet Take 81 mg by mouth daily.     . Calcium Carbonate-Vitamin D (CALCIUM + D PO) Take 600 mg by mouth. Taking 1 Tablet Daily Tablet contains Magnesium and Zinc.    . fluticasone (FLONASE) 50 MCG/ACT nasal spray Place into the nose.    [name] HYDROcodone-acetaminophen (NORCO/VICODIN) 5-325 MG per tablet Take 1 tablet by mouth every 6 (six) hours as needed for pain.     [name] levothyroxine (SYNTHROID, LEVOTHROID) 112 MCG tablet Take 112 mcg by mouth daily before breakfast.    . LORazepam (ATIVAN) 1 MG tablet Take 1 mg by mouth 2 (two) times daily.    . metoprolol succinate (TOPROL-XL) 25 MG 24 hr tablet Take 25 mg by mouth daily.      [name] omeprazole (PRILOSEC) 20 MG capsule Take 20 mg by mouth daily.     . pseudoephedrine (SUDAFED) 120 MG 12 hr tablet Take 120 mg by mouth as needed.     [name] rOPINIRole (REQUIP) 1 MG tablet Take 1 mg by  mouth 3 (three) times daily.    [name] warfarin (COUMADIN) 4 MG tablet Take 1.5 tablets (6 mg total) by mouth daily at 6 pm or as directed 140 tablet 3  . buPROPion (WELLBUTRIN SR) 200 MG 12 hr tablet Take 200 mg by mouth 2 (two) times daily.      No current facility-administered medications for this visit.   REVIEW OF SYSTEMS: Valu.Nieves ] denotes positive finding; [  ] denotes negative finding  CARDIOVASCULAR:  [ ]  chest pain   [ ]  chest pressure   [ ]  palpitations   [ ]  orthopnea   [ ]  dyspnea on exertion   [ ]  claudication   [ ]  rest pain   [ ]  DVT   [ ]  phlebitis PULMONARY:   [ ]  productive cough   [ ]  asthma   [ ]  wheezing NEUROLOGIC:   [ ]  weakness  [ ]  paresthesias  [ ]  aphasia  [ ]  amaurosis  [ ]  dizziness HEMATOLOGIC:   [ ]  bleeding problems   Valu.Nieves ] clotting disorders MUSCULOSKELETAL:  [ ]  joint pain   [ ]  joint swelling [ ]  leg swelling GASTROINTESTINAL: [ ]   blood in stool  [ ]   hematemesis GENITOURINARY:  [ ]   dysuria  [ ]   hematuria PSYCHIATRIC:  [ ]  history of major depression INTEGUMENTARY:  [ ]  rashes  [ ]  ulcers CONSTITUTIONAL:  [ ]  fever   [ ]  chills  PHYSICAL EXAM: Filed Vitals:   04/02/15 1252  BP: 133/86  Pulse: 68  Temp: 97.7 F (36.5 C)  Resp: 16  Height: 5\' 5"  (1.651 m)  Weight: 186 lb (84.369 kg)  SpO2: 98%   GENERAL: The patient is a well-nourished female, in no acute distress. The vital signs are documented above. CARDIAC: There is a regular rate and rhythm.  VASCULAR: I do not detect carotid bruits. She has palpable femoral popliteal and pedal pulses bilaterally. PULMONARY: There is good air exchange bilaterally without wheezing or rales. ABDOMEN: Soft and non-tender with normal pitched bowel sounds.  MUSCULOSKELETAL: There are no major deformities or cyanosis. NEUROLOGIC: No focal weakness or paresthesias are detected. SKIN: There are no ulcers or rashes noted. PSYCHIATRIC: The patient has a normal affect.  DATA:   ARTERIAL DOPPLER STUDY: I have  independently interpreted her arterial Doppler study today which is triphasic Doppler signals in the dorsalis pedis and posterior tibial positions bilaterally with an  ABI of 100% bilaterally.  VENOUS DUPLEX SCAN RIGHT LOWER EXTREMITY: I have independently interpreted her venous duplex scan. There is no evidence of DVT of the right lower extremity. There is no Baker's cyst identified.  MEDICAL ISSUES:  RIGHT LEG PAIN: Given the patient's right leg pain, trip to [place], and hypercoagulable state, I obtained a venous duplex scan today to rule out a DVT. Her duplex can showed no evidence of DVT and no evidence of a Baker's cyst. This appears to be musculoskeletal pain or possibly arthritic pain. I have encouraged her to stay as active as possible. Given her history of chronic venous insufficiency she knows to continue to elevate her legs daily. I will see her back as needed.   Deitra Mayo [facility] [place]: [phone]

## 2015-04-09 ENCOUNTER — Telehealth: Payer: Self-pay | Admitting: *Deleted

## 2015-04-09 ENCOUNTER — Other Ambulatory Visit: Payer: Self-pay | Admitting: Oncology

## 2015-04-09 DIAGNOSIS — D6861 Antiphospholipid syndrome: Secondary | ICD-10-CM

## 2015-04-09 DIAGNOSIS — Z7901 Long term (current) use of anticoagulants: Secondary | ICD-10-CM

## 2015-04-09 NOTE — Telephone Encounter (Signed)
Please just have her come here!

## 2015-04-09 NOTE — Telephone Encounter (Signed)
Pt wants to know where to have PT/INR done - if at her doctor's office, they will need an order but she said she can come here @ [facility].

## 2015-04-09 NOTE — Telephone Encounter (Signed)
No need to hold a dose. Repeat PT in 1 week - if still high, I will adjust dose

## 2015-04-09 NOTE — Telephone Encounter (Signed)
Pt called / informed INR 3.9, running a little high, and to stay on 6 mg of Coumadin since this has been a good dose for her for a long time per Dr Beryle Beams. Pt stated usually when it's high, I have to hold a dose. Also wants to know when should she have INR re-check? Thanks

## 2015-04-09 NOTE — Telephone Encounter (Signed)
-----   Message from Annia Belt, MD sent at 04/09/2015  3:06 PM EDT ----- Call pt: INR running a little high but I would like her to stay on 6 mg daily coumadin since this has been a good dose for her for a long time. We are probably seeing a variation since this test was done at a different lab . DrG ----- Message -----    From: Ebbie Latus, RN    Sent: 04/09/2015  10:34 AM      To: Annia Belt, MD  Dr Darnell Level, Received fax from [facility] at [facility] - INR result of 3.9. I will put fax in your mailbox. Stacie Knutzen

## 2015-04-11 NOTE — Telephone Encounter (Signed)
Called pt - she will be @ [facility] on 9/20 @ 1130M for PT/INR.

## 2015-04-15 ENCOUNTER — Other Ambulatory Visit: Payer: Self-pay | Admitting: Oncology

## 2015-04-15 ENCOUNTER — Other Ambulatory Visit (INDEPENDENT_AMBULATORY_CARE_PROVIDER_SITE_OTHER): Payer: Medicare HMO

## 2015-04-15 DIAGNOSIS — Z7901 Long term (current) use of anticoagulants: Secondary | ICD-10-CM | POA: Diagnosis not present

## 2015-04-15 DIAGNOSIS — R760 Raised antibody titer: Secondary | ICD-10-CM

## 2015-04-15 DIAGNOSIS — D6861 Antiphospholipid syndrome: Secondary | ICD-10-CM | POA: Diagnosis not present

## 2015-04-15 DIAGNOSIS — Z862 Personal history of diseases of the blood and blood-forming organs and certain disorders involving the immune mechanism: Secondary | ICD-10-CM

## 2015-04-15 LAB — POCT INR: INR: 3.6

## 2015-04-16 ENCOUNTER — Ambulatory Visit: Payer: Self-pay | Admitting: Oncology

## 2015-04-16 NOTE — Progress Notes (Signed)
Patient on chronic coumadin for remote arterial embolus to a finger on her left hand in October 1998 as first sign of antiphospholipid antibody syndrome. Lupus anticoagulant positive. She also has hx of stage 1 breast cancer. Target INR 2-3. INR today 3.6. No med changes. She has been on a stable dose of coumadin 6 mg daily for many years.  I will continue current dose. I counseled patient in person. I see no reason why routine coumadin monitoring can't be done by her primary care MD's office since cancer center coumadin clinic has closed. She also has the option of following here at our [facility] with 2 experienced pharmacists but prefers  Lab closer to her home. She is with [facility] in [place] under Dr Kathryne Eriksson. She should have her coumadin checked again in 1 month then every othewr month if stable.

## 2015-04-21 LAB — PROTIME-INR

## 2015-06-03 ENCOUNTER — Ambulatory Visit: Payer: Medicare HMO | Admitting: Oncology

## 2015-06-11 ENCOUNTER — Other Ambulatory Visit: Payer: Self-pay

## 2015-06-11 ENCOUNTER — Other Ambulatory Visit: Payer: Self-pay | Admitting: Oncology

## 2015-06-11 DIAGNOSIS — Z7901 Long term (current) use of anticoagulants: Secondary | ICD-10-CM

## 2015-06-11 DIAGNOSIS — D6859 Other primary thrombophilia: Secondary | ICD-10-CM

## 2015-06-11 MED ORDER — WARFARIN SODIUM 4 MG PO TABS: ORAL_TABLET | ORAL | Status: DC

## 2015-06-18 ENCOUNTER — Other Ambulatory Visit: Payer: Self-pay

## 2015-06-18 DIAGNOSIS — Z1231 Encounter for screening mammogram for malignant neoplasm of breast: Secondary | ICD-10-CM

## 2015-06-30 ENCOUNTER — Other Ambulatory Visit: Payer: Self-pay | Admitting: Family Medicine

## 2015-06-30 DIAGNOSIS — M810 Age-related osteoporosis without current pathological fracture: Secondary | ICD-10-CM

## 2015-07-24 ENCOUNTER — Ambulatory Visit (INDEPENDENT_AMBULATORY_CARE_PROVIDER_SITE_OTHER): Payer: Medicare HMO | Admitting: Pharmacist

## 2015-07-24 DIAGNOSIS — D6861 Antiphospholipid syndrome: Secondary | ICD-10-CM

## 2015-07-24 DIAGNOSIS — R760 Raised antibody titer: Secondary | ICD-10-CM

## 2015-07-24 DIAGNOSIS — Z86718 Personal history of other venous thrombosis and embolism: Secondary | ICD-10-CM

## 2015-07-24 DIAGNOSIS — Z7901 Long term (current) use of anticoagulants: Secondary | ICD-10-CM | POA: Diagnosis not present

## 2015-07-24 LAB — POCT INR: INR: 2.1

## 2015-07-24 MED ORDER — WARFARIN SODIUM 6 MG PO TABS: 6.0000 mg | ORAL_TABLET | Freq: Every day | ORAL | Status: DC

## 2015-07-24 NOTE — Progress Notes (Signed)
Reviewed thx DrG 

## 2015-07-24 NOTE — Progress Notes (Signed)
Anticoagulation Management [name] is a 69 y.o. female who reports to the clinic for monitoring of warfarin treatment.    Indication: history DVT and antiphospholipid antibody syndrome Duration: indefinite  Anticoagulation Clinic Visit History: Anticoagulation Episode Summary    Current INR goal 2.0-3.0  Next INR check 08/04/2015  INR from last check 2.1 (07/24/2015)  Weekly max dose   Target end date   INR check location   Preferred lab   Send INR reminders to [facility]   Indications  Antiphospholipid antibody syndrome ([place]) [D68.61] Lupus anticoagulant positive [R79.0]        Comments       Anticoagulation Care Providers    Provider Role Specialty Phone number   Annia Belt, MD Referring Oncology [phone]     ASSESSMENT Recent Results: Recent results are below, the most recent result is correlated with a dose of 33 mg per week: Lab Results  Component Value Date   INR 2.1 07/24/2015   INR 3.6 04/15/2015   INR 2.5 03/13/2015   PROTIME 30.0* 03/13/2015   INR today: Therapeutic, however, this has decreased from 4.2 on 07/17/15.   Anticoagulation Dosing: INR as of 07/24/2015 and Previous Dosing Information    INR Dt INR Goal [name] Sun Mon Tue Wed Thu Fri Sat   07/24/2015 2.1 2.0-3.0 42 mg 6 mg 6 mg 6 mg 6 mg 6 mg 6 mg 6 mg    Previous description        Continue coumadin 6mg  daily. Recheck INR in 1 month on 04/09/15 with [facility] and results should be faxed to Dr. Beryle Beams as ordered    Anticoagulation Dose Instructions as of 07/24/2015      Total Sun Mon Tue Wed Thu Fri Sat   New Dose 42 mg 6 mg 6 mg 6 mg 6 mg 6 mg 6 mg 6 mg     (6 mg x 1)  (6 mg x 1)  (6 mg x 1)  (6 mg x 1)  (6 mg x 1)  (6 mg x 1)  (6 mg x 1)                         Description        Continue coumadin 6mg  daily.       PLAN Will increase dose back to baseline (patient has been on 6 mg daily for years).  Patient Instructions   Patient educated about medication as defined in this encounter and verbalized understanding by repeating back instructions provided.   Follow-up 08/03/14  [name],[name]  30 minutes spent face-to-face with the patient during the encounter. 75% of time spent on education. 25% of time was spent on assessment and plan.

## 2015-07-24 NOTE — Patient Instructions (Signed)
Patient educated about medication as defined in this encounter and verbalized understanding by repeating back instructions provided.   

## 2015-07-25 ENCOUNTER — Ambulatory Visit: Payer: Medicare HMO

## 2015-08-04 ENCOUNTER — Ambulatory Visit (INDEPENDENT_AMBULATORY_CARE_PROVIDER_SITE_OTHER): Payer: Medicare HMO | Admitting: Oncology

## 2015-08-04 ENCOUNTER — Ambulatory Visit (INDEPENDENT_AMBULATORY_CARE_PROVIDER_SITE_OTHER): Payer: Medicare HMO | Admitting: Pharmacist

## 2015-08-04 ENCOUNTER — Other Ambulatory Visit: Payer: Medicare HMO

## 2015-08-04 ENCOUNTER — Ambulatory Visit: Payer: Medicare HMO

## 2015-08-04 ENCOUNTER — Encounter: Payer: Self-pay | Admitting: Oncology

## 2015-08-04 VITALS — BP 148/79 | HR 65 | Temp 97.5°F | Ht 65.0 in | Wt 194.4 lb

## 2015-08-04 DIAGNOSIS — D6861 Antiphospholipid syndrome: Secondary | ICD-10-CM | POA: Diagnosis not present

## 2015-08-04 DIAGNOSIS — Z7901 Long term (current) use of anticoagulants: Secondary | ICD-10-CM

## 2015-08-04 DIAGNOSIS — F418 Other specified anxiety disorders: Secondary | ICD-10-CM

## 2015-08-04 DIAGNOSIS — R790 Abnormal level of blood mineral: Secondary | ICD-10-CM

## 2015-08-04 DIAGNOSIS — Z853 Personal history of malignant neoplasm of breast: Secondary | ICD-10-CM

## 2015-08-04 DIAGNOSIS — R760 Raised antibody titer: Secondary | ICD-10-CM

## 2015-08-04 DIAGNOSIS — D689 Coagulation defect, unspecified: Secondary | ICD-10-CM | POA: Diagnosis not present

## 2015-08-04 DIAGNOSIS — Z9229 Personal history of other drug therapy: Secondary | ICD-10-CM | POA: Insufficient documentation

## 2015-08-04 DIAGNOSIS — I742 Embolism and thrombosis of arteries of the upper extremities: Secondary | ICD-10-CM

## 2015-08-04 HISTORY — DX: Long term (current) use of anticoagulants: Z79.01

## 2015-08-04 LAB — POCT INR: INR: 2.1

## 2015-08-04 NOTE — Patient Instructions (Signed)
To lab today Return visit 1 year Lab 1 week before visit 

## 2015-08-04 NOTE — Progress Notes (Signed)
Anti-Coagulation Progress Note  [name] is a 70 y.o. female who is currently on an anti-coagulation regimen.    RECENT RESULTS: Recent results are below, the most recent result is correlated with a dose of 42 mg. per week: Lab Results  Component Value Date   INR 2.1 08/04/2015   INR 2.1 07/24/2015   INR 3.6 04/15/2015   PROTIME 30.0* 03/13/2015    ANTI-COAG DOSE: Anticoagulation Dose Instructions as of 08/04/2015      [name] Tue Wed Thu Fri Sat   New Dose 6 mg 6 mg 6 mg 6 mg 6 mg 6 mg 6 mg    Description        Continue coumadin 6mg  daily.        ANTICOAG SUMMARY: Anticoagulation Episode Summary    Current INR goal 2.0-3.0  Next INR check 09/01/2015  INR from last check 2.1 (08/04/2015)  Weekly max dose   Target end date   INR check location   Preferred lab   Send INR reminders to [place]   Indications  Antiphospholipid antibody syndrome ([place]) [D68.61] Lupus anticoagulant positive [R79.0]        Comments       Anticoagulation Care Providers    Provider Role Specialty Phone number   Annia Belt, MD Referring Oncology [phone]      ANTICOAG TODAY: Anticoagulation Summary as of 08/04/2015    INR goal 2.0-3.0  Selected INR 2.1 (08/04/2015)  Next INR check 09/01/2015  Target end date    Indications  Antiphospholipid antibody syndrome ([place]) [D68.61] Lupus anticoagulant positive [R79.0]      Anticoagulation Episode Summary    INR check location    Preferred lab    Send INR reminders to [place]   Comments     Anticoagulation Care Providers    Provider Role Specialty Phone number   Annia Belt, MD Referring Oncology [phone]      PATIENT INSTRUCTIONS: Patient Instructions  Patient instructed to take medications as defined in the Anti-coagulation Track section of this encounter.  Patient instructed to take today's dose.  Patient verbalized understanding of these instructions.       FOLLOW-UP Return  in 4 weeks (on 09/01/2015) for Follow up INR at [place], III Pharm.D., CACP

## 2015-08-04 NOTE — Progress Notes (Signed)
Reviewed Thanks DrG 

## 2015-08-04 NOTE — Progress Notes (Signed)
Patient ID: [name], female   DOB: Dec 04, 1945, 70 y.o.   MRN: [id number] Hematology and Oncology Follow Up Visit  [name] [id number] 11-19-1945 70 y.o. 08/04/2015 7:18 PM   Principle Diagnosis: Encounter Diagnoses  Name Primary?  [name] Antiphospholipid antibody syndrome ([place]) Yes  . Lupus anticoagulant positive   . Thrombosis, upper extremity artery ([place])   . Chronic anticoagulation    clinical summary: 70 year old woman who has a history of an arterial embolus to one of the fingers of her left hand in October 1998 as the first sign of antiphospholipid antibody syndrome. She has been on chronic Coumadin anticoagulation with no subsequent thrombotic events.   She developed a stage I, ER-positive cancer of the right breast in December 1999, treated with lumpectomy, radiation, and then hormonal therapy with tamoxifen. She developed a large hematoma at the lumpectomy site which then organized into dense tissue which had to be excised. She still has a dense scar in the right breast.   She had an upper endoscopy and colonoscopy by Dr. Juanita Craver on 03/09/2011. There was some question of Barrett esophagus in the past, reported by another gastroenterologist. However, Dr. Collene Mares did not find any evidence of this on her study . There were changes from prior Nissen fundoplication. There was a questionable small patch of Barrett esophagus above the Z-line which was biopsied. Biopsies returned showing focal mild active esophagitis but no evidence for Barrett esophagus. She was started on an antireflux regimen. Unfortunately, the previous Nissen fundoplication broke down and she required a repair procedure on 12/12/2012 by Dr. Johnathan Hausen.  Interim History:  Overall stable since her visit with me last year. She reports recently closing a door on her right index finger about a week ago. She got a small cut which bled profusely for a number of hours. Of note, INR is low therapeutic at 2.1.  Current Coumadin dose 6 mg daily. No other clinical bleeding or bruising.  She continues to have GI complaints primarily with copious secretions which she mobilizes mostly at night.  Occasional wheezing. Not using any bronchodilators on a regular basis. She denies any dyspnea, chest pain, or palpitations.  She has not noticed any new lumps in her breasts. She was due to have a mammogram today but due to the weather and scheduling problems had to reschedule.  Her son who is going to be 67 years old has some form of a progressive, metabolic, bone disorder. His son who is just starting college is developing the same disease. She is very concerned that her son is not going to survive this bone disorder. He recently took her to [place]. He told her he was concerned that he might not be able to go on future trips due to his health. In addition to this, she is taking care of her mother with dementia and the mother is starting to become very aggressive. She continues on her antidepressants.     Medications: reviewed  Allergies:  Allergies  Allergen Reactions  . Vitamin K Anaphylaxis and Rash    "kills me" iv  . Benadryl [Diphenhydramine Hcl]     Rash  . Crestor [Rosuvastatin Calcium]     Muscle Aches  . Daypro [Oxaprozin]     Rash  . Doxycycline     Tongue burning, thrush  . [place] Venom     Anaphylaxis Shock  . Levofloxacin   . Levofloxacin     Rash  . Reglan [Metoclopramide] Other (See Comments)  Tongue got numb; didn't feel good  . Zocor [Simvastatin]     Muscle Aches  . Sulfonamide Derivatives Rash    Review of Systems: See history of present illness Remaining ROS negative:   Physical Exam: Blood pressure 148/79, pulse 65, temperature 97.5 F (36.4 C), temperature source Oral, height 5\' 5"  (1.651 m), weight 194 lb 6.4 oz (88.179 kg), SpO2 99 %. Wt Readings from Last 3 Encounters:  08/04/15 194 lb 6.4 oz (88.179 kg)  04/02/15 186 lb (84.369 kg)  04/23/14 186 lb 11.2 oz  (84.687 kg)     General appearance: Well-nourished Caucasian woman HENNT: Pharynx no erythema, exudate, mass, or ulcer. Midline scar status post previous thyroidectomy. Lymph nodes: No cervical, supraclavicular, or axillary lymphadenopathy Breasts: skin retraction and dense scar upper inner quadrant right breast, no dominant mass in either breast Lungs: Clear to auscultation, resonant to percussion throughout Heart: Regular rhythm, no murmur, no gallop, no rub, no click, no edema Abdomen: Soft, nontender, normal bowel sounds, no mass, no organomegaly Extremities: No edema, no calf tenderness Musculoskeletal: no joint deformities GU:  Vascular: Carotid pulses 2+, no bruits,  Neurologic: Alert, oriented, PERRLA, optic discs sharp and vessels normal, no hemorrhage or exudate, cranial nerves grossly normal, motor strength 5 over 5, reflexes 1+ symmetric upper extremity, absent symmetric at the knees, upper body coordination normal, gait normal, Skin: No rash or ecchymosis  Lab Results: CBC W/Diff    Component Value Date/Time   WBC 8.3 02/12/2015 1122   WBC 5.6 12/14/2012 0456   RBC 4.18 02/12/2015 1122   RBC 3.35* 12/14/2012 0456   HGB 13.0 02/12/2015 1122   HGB 10.1* 12/14/2012 0456   HCT 38.8 02/12/2015 1122   HCT 31.6* 12/14/2012 0456   PLT 183 02/12/2015 1122   PLT 170 12/14/2012 0456   MCV 92.8 02/12/2015 1122   MCV 94.3 12/14/2012 0456   MCH 31.1 02/12/2015 1122   MCH 30.1 12/14/2012 0456   MCHC 33.5 02/12/2015 1122   MCHC 32.0 12/14/2012 0456   RDW 14.0 02/12/2015 1122   RDW 13.7 12/14/2012 0456   LYMPHSABS 2.9 02/12/2015 1122   LYMPHSABS 2.0 12/14/2012 0456   MONOABS 0.6 02/12/2015 1122   MONOABS 0.4 12/14/2012 0456   EOSABS 0.3 02/12/2015 1122   EOSABS 0.2 12/14/2012 0456   BASOSABS 0.1 02/12/2015 1122   BASOSABS 0.0 12/14/2012 0456     Chemistry      Component Value Date/Time   NA 140 04/09/2014 1513   NA 137 12/06/2012 1355   K 3.9 04/09/2014 1513   K 4.6  12/06/2012 1355   CL 99 12/06/2012 1355   CL 102 10/19/2012 1301   CO2 24 04/09/2014 1513   CO2 29 12/06/2012 1355   BUN 16.8 04/09/2014 1513   BUN 18 12/06/2012 1355   CREATININE 1.4* 04/09/2014 1513   CREATININE 0.95 12/12/2012 2012      Component Value Date/Time   CALCIUM 8.7 04/09/2014 1513   CALCIUM 9.8 12/06/2012 1355   ALKPHOS 101 04/09/2014 1513   ALKPHOS 103 07/27/2012 2235   AST 15 04/09/2014 1513   AST 21 07/27/2012 2235   ALT 13 04/09/2014 1513   ALT 17 07/27/2012 2235   BILITOT 0.41 04/09/2014 1513   BILITOT 0.3 07/27/2012 2235       Radiological Studies: She was due for mammogram today but the study was rescheduled   Impression:  #1. Coagulopathy secondary to antiphospholipid antibody syndrome status post remote arterial embolus to a digit of her left  hand. She continues on chronic Coumadin anticoagulation.   #2. Stage I, ER-positive, breast cancer treated as outlined above. No evidence for recurrence now out over 17 years. continue annual mammograms.  #3. Status post redo Nissen fundoplication for hiatus hernia  #4. Chronic anxiety and depression. She has a number of good reasons to be anxious and depressed as outlined above.  #5. Heavy upper airway versus GI secretions She will be following up with her gastroenterologist soon. I suggested that she try an antihistamine in the interim.    CC: Patient Care Team: [name], PA-C as PCP - General (Family Medicine)   Annia Belt, MD 1/9/20177:18 PM

## 2015-08-04 NOTE — Patient Instructions (Signed)
Patient instructed to take medications as defined in the Anti-coagulation Track section of this encounter.  Patient instructed to take today's dose.  Patient verbalized understanding of these instructions.    

## 2015-08-05 LAB — CBC WITH DIFFERENTIAL/PLATELET
Basophils Absolute: 0 10*3/uL (ref 0.0–0.2)
Basos: 1 %
EOS (ABSOLUTE): 0.2 10*3/uL (ref 0.0–0.4)
Eos: 2 %
Hematocrit: 37.4 % (ref 34.0–46.6)
Hemoglobin: 12.4 g/dL (ref 11.1–15.9)
Immature Grans (Abs): 0 10*3/uL (ref 0.0–0.1)
Immature Granulocytes: 0 %
Lymphocytes Absolute: 2.5 10*3/uL (ref 0.7–3.1)
Lymphs: 35 %
MCH: 30.3 pg (ref 26.6–33.0)
MCHC: 33.2 g/dL (ref 31.5–35.7)
MCV: 91 fL (ref 79–97)
Monocytes Absolute: 0.5 10*3/uL (ref 0.1–0.9)
Monocytes: 7 %
Neutrophils Absolute: 4 10*3/uL (ref 1.4–7.0)
Neutrophils: 55 %
Platelets: 270 10*3/uL (ref 150–379)
RBC: 4.09 x10E6/uL (ref 3.77–5.28)
RDW: 13.9 % (ref 12.3–15.4)
WBC: 7.1 10*3/uL (ref 3.4–10.8)

## 2015-08-05 LAB — COMPREHENSIVE METABOLIC PANEL
ALT: 15 IU/L (ref 0–32)
AST: 18 IU/L (ref 0–40)
Albumin/Globulin Ratio: 1.6 (ref 1.1–2.5)
Albumin: 4.4 g/dL (ref 3.6–4.8)
Alkaline Phosphatase: 108 IU/L (ref 39–117)
BUN/Creatinine Ratio: 18 (ref 11–26)
BUN: 23 mg/dL (ref 8–27)
Bilirubin Total: 0.3 mg/dL (ref 0.0–1.2)
CO2: 23 mmol/L (ref 18–29)
Calcium: 9.2 mg/dL (ref 8.7–10.3)
Chloride: 101 mmol/L (ref 96–106)
Creatinine, Ser: 1.27 mg/dL — ABNORMAL HIGH (ref 0.57–1.00)
GFR calc Af Amer: 50 mL/min/{1.73_m2} — ABNORMAL LOW (ref >59)
GFR calc non Af Amer: 43 mL/min/{1.73_m2} — ABNORMAL LOW (ref >59)
Globulin, Total: 2.7 g/dL (ref 1.5–4.5)
Glucose: 89 mg/dL (ref 65–99)
Potassium: 4.7 mmol/L (ref 3.5–5.2)
Sodium: 140 mmol/L (ref 134–144)
Total Protein: 7.1 g/dL (ref 6.0–8.5)

## 2015-08-06 ENCOUNTER — Telehealth: Payer: Self-pay | Admitting: *Deleted

## 2015-08-06 NOTE — Telephone Encounter (Signed)
Pt called - no answer; left message all labs are OK per Dr Beryle Beams. And to call if she has any questions.

## 2015-08-06 NOTE — Telephone Encounter (Signed)
-----   Message from Annia Belt, MD sent at 08/05/2015  1:35 PM EST ----- Call pt: lab all OK

## 2015-08-07 ENCOUNTER — Other Ambulatory Visit: Payer: Self-pay | Admitting: Gastroenterology

## 2015-08-07 DIAGNOSIS — R1310 Dysphagia, unspecified: Secondary | ICD-10-CM

## 2015-08-12 ENCOUNTER — Ambulatory Visit
Admission: RE | Admit: 2015-08-12 | Discharge: 2015-08-12 | Disposition: A | Discharge status: Home / Self Care | Payer: Medicare HMO | Source: Ambulatory Visit

## 2015-08-12 ENCOUNTER — Ambulatory Visit
Admission: RE | Admit: 2015-08-12 | Discharge: 2015-08-12 | Disposition: A | Discharge status: Home / Self Care | Payer: Medicare HMO | Source: Ambulatory Visit | Attending: Family Medicine | Admitting: Family Medicine

## 2015-08-12 DIAGNOSIS — M810 Age-related osteoporosis without current pathological fracture: Secondary | ICD-10-CM

## 2015-08-12 DIAGNOSIS — Z1231 Encounter for screening mammogram for malignant neoplasm of breast: Secondary | ICD-10-CM

## 2015-08-19 ENCOUNTER — Encounter (INDEPENDENT_AMBULATORY_CARE_PROVIDER_SITE_OTHER): Payer: Self-pay

## 2015-08-19 ENCOUNTER — Ambulatory Visit
Admission: RE | Admit: 2015-08-19 | Discharge: 2015-08-19 | Disposition: A | Discharge status: Home / Self Care | Payer: Medicare HMO | Source: Ambulatory Visit | Attending: Gastroenterology | Admitting: Gastroenterology

## 2015-08-19 DIAGNOSIS — R1310 Dysphagia, unspecified: Secondary | ICD-10-CM

## 2015-08-29 ENCOUNTER — Telehealth: Payer: Self-pay | Admitting: Family Medicine

## 2015-08-29 NOTE — Telephone Encounter (Signed)
APPT REMINDER CALL/LMTCB IF SHE NEEDS TO CANCEL °

## 2015-09-01 ENCOUNTER — Ambulatory Visit (INDEPENDENT_AMBULATORY_CARE_PROVIDER_SITE_OTHER): Payer: Medicare HMO | Admitting: Pharmacist

## 2015-09-01 DIAGNOSIS — Z7901 Long term (current) use of anticoagulants: Secondary | ICD-10-CM | POA: Diagnosis not present

## 2015-09-01 DIAGNOSIS — R790 Abnormal level of blood mineral: Secondary | ICD-10-CM

## 2015-09-01 DIAGNOSIS — R760 Raised antibody titer: Secondary | ICD-10-CM

## 2015-09-01 DIAGNOSIS — D6861 Antiphospholipid syndrome: Secondary | ICD-10-CM

## 2015-09-01 LAB — POCT INR: INR: 2

## 2015-09-01 MED ORDER — WARFARIN SODIUM 6 MG PO TABS: ORAL_TABLET | ORAL | Status: DC

## 2015-09-01 NOTE — Progress Notes (Signed)
Anti-Coagulation Progress Note  [name] is a 70 y.o. female who is currently on an anti-coagulation regimen.    RECENT RESULTS: Recent results are below, the most recent result is correlated with a dose of 42 mg. per week: Lab Results  Component Value Date   INR 2.00 09/01/2015   INR 2.1 08/04/2015   INR 2.1 07/24/2015   PROTIME 30.0* 03/13/2015    ANTI-COAG DOSE: Anticoagulation Dose Instructions as of 09/01/2015      [name] Tue Wed Thu Fri Sat   New Dose 6 mg 9 mg 6 mg 6 mg 9 mg 6 mg 6 mg    Description        Continue coumadin 6mg  daily.        ANTICOAG SUMMARY: Anticoagulation Episode Summary    Current INR goal 2.0-3.0  Next INR check 09/29/2015  INR from last check 2.00 (09/01/2015)  Weekly max dose   Target end date   INR check location   Preferred lab   Send INR reminders to [place]   Indications  Antiphospholipid antibody syndrome ([place]) [D68.61] Lupus anticoagulant positive [R79.0]        Comments       Anticoagulation Care Providers    Provider Role Specialty Phone number   Annia Belt, MD Referring Oncology [phone]      ANTICOAG TODAY: Anticoagulation Summary as of 09/01/2015    INR goal 2.0-3.0  Selected INR 2.00 (09/01/2015)  Next INR check 09/29/2015  Target end date    Indications  Antiphospholipid antibody syndrome ([place]) [D68.61] Lupus anticoagulant positive [R79.0]      Anticoagulation Episode Summary    INR check location    Preferred lab    Send INR reminders to [place]   Comments     Anticoagulation Care Providers    Provider Role Specialty Phone number   Annia Belt, MD Referring Oncology [phone]      PATIENT INSTRUCTIONS: Patient Instructions  Patient instructed to take medications as defined in the Anti-coagulation Track section of this encounter.  Patient instructed to take today's dose.  Patient verbalized understanding of these instructions.        FOLLOW-UP Return in 4 weeks (on 09/29/2015) for Follow up INR at [facility], III Pharm.D., CACP

## 2015-09-01 NOTE — Patient Instructions (Signed)
Patient instructed to take medications as defined in the Anti-coagulation Track section of this encounter.  Patient instructed to take today's dose.  Patient verbalized understanding of these instructions.    

## 2015-09-10 ENCOUNTER — Telehealth: Payer: Self-pay | Admitting: *Deleted

## 2015-09-10 NOTE — Telephone Encounter (Signed)
Entered in error

## 2015-09-22 ENCOUNTER — Other Ambulatory Visit: Payer: Self-pay | Admitting: Oncology

## 2015-09-29 ENCOUNTER — Ambulatory Visit: Payer: Medicare HMO

## 2015-10-02 ENCOUNTER — Other Ambulatory Visit: Payer: Self-pay | Admitting: Nephrology

## 2015-10-02 DIAGNOSIS — N183 Chronic kidney disease, stage 3 unspecified: Secondary | ICD-10-CM

## 2015-10-02 DIAGNOSIS — R3129 Other microscopic hematuria: Secondary | ICD-10-CM

## 2015-10-09 ENCOUNTER — Ambulatory Visit
Admission: RE | Admit: 2015-10-09 | Discharge: 2015-10-09 | Disposition: A | Discharge status: Home / Self Care | Payer: Medicare HMO | Source: Ambulatory Visit | Attending: Nephrology | Admitting: Nephrology

## 2015-10-09 DIAGNOSIS — N183 Chronic kidney disease, stage 3 unspecified: Secondary | ICD-10-CM

## 2015-10-09 DIAGNOSIS — R3129 Other microscopic hematuria: Secondary | ICD-10-CM

## 2015-11-03 ENCOUNTER — Ambulatory Visit (INDEPENDENT_AMBULATORY_CARE_PROVIDER_SITE_OTHER): Payer: Medicare HMO | Admitting: Pharmacist

## 2015-11-03 DIAGNOSIS — D6861 Antiphospholipid syndrome: Secondary | ICD-10-CM | POA: Diagnosis not present

## 2015-11-03 DIAGNOSIS — R760 Raised antibody titer: Secondary | ICD-10-CM

## 2015-11-03 DIAGNOSIS — R790 Abnormal level of blood mineral: Secondary | ICD-10-CM

## 2015-11-03 DIAGNOSIS — Z7901 Long term (current) use of anticoagulants: Secondary | ICD-10-CM | POA: Diagnosis not present

## 2015-11-03 LAB — POCT INR: INR: 2.3

## 2015-11-03 NOTE — Patient Instructions (Signed)
Patient instructed to take medications as defined in the Anti-coagulation Track section of this encounter.  Patient instructed to take today's dose.  Patient verbalized understanding of these instructions.    

## 2015-11-03 NOTE — Progress Notes (Signed)
Anti-Coagulation Progress Note  [name] is a 70 y.o. female who is currently on an anti-coagulation regimen.    RECENT RESULTS: Recent results are below, the most recent result is correlated with a dose of 48 mg. per week: Lab Results  Component Value Date   INR 2.30 11/03/2015   INR 2.00 09/01/2015   INR 2.1 08/04/2015   PROTIME 30.0* 03/13/2015    ANTI-COAG DOSE: Anticoagulation Dose Instructions as of 11/03/2015      [name] Tue Wed Thu Fri Sat   New Dose 6 mg 9 mg 6 mg 6 mg 9 mg 6 mg 6 mg       ANTICOAG SUMMARY: Anticoagulation Episode Summary    Current INR goal 2.0-3.0  Next INR check 12/01/2015  INR from last check 2.30 (11/03/2015)  Weekly max dose   Target end date   INR check location   Preferred lab   Send INR reminders to [name]   Indications  Antiphospholipid antibody syndrome ([name]) [D68.61] Lupus anticoagulant positive [R79.0]        Comments       Anticoagulation Care Providers    Provider Role Specialty Phone number   Annia Belt, MD Referring Oncology [phone]      ANTICOAG TODAY: Anticoagulation Summary as of 11/03/2015    INR goal 2.0-3.0  Selected INR 2.30 (11/03/2015)  Next INR check 12/01/2015  Target end date    Indications  Antiphospholipid antibody syndrome ([place]) [D68.61] Lupus anticoagulant positive [R79.0]      Anticoagulation Episode Summary    INR check location    Preferred lab    Send INR reminders to [name] Providers    Provider Role Specialty Phone number   Annia Belt, MD Referring Oncology [phone]      PATIENT INSTRUCTIONS: Patient Instructions  Patient instructed to take medications as defined in the Anti-coagulation Track section of this encounter.  Patient instructed to take today's dose.  Patient verbalized understanding of these instructions.        FOLLOW-UP Return in about 4 weeks (around 12/01/2015) for F/u INR @  2pm.  [name], III Pharm.D., CACP

## 2015-11-19 NOTE — Progress Notes (Signed)
I have reviewed Dr. Gladstone Pih note.  Patient is on [facility] for lupus anticoagulant, APL.  INR at goal.  Coumadin not changed.

## 2015-11-25 DIAGNOSIS — C801 Malignant (primary) neoplasm, unspecified: Secondary | ICD-10-CM | POA: Insufficient documentation

## 2015-11-25 DIAGNOSIS — F32A Depression, unspecified: Secondary | ICD-10-CM | POA: Insufficient documentation

## 2015-11-25 DIAGNOSIS — G4730 Sleep apnea, unspecified: Secondary | ICD-10-CM | POA: Insufficient documentation

## 2015-11-25 DIAGNOSIS — G939 Disorder of brain, unspecified: Secondary | ICD-10-CM | POA: Insufficient documentation

## 2015-11-25 DIAGNOSIS — F329 Major depressive disorder, single episode, unspecified: Secondary | ICD-10-CM | POA: Insufficient documentation

## 2015-11-25 DIAGNOSIS — E785 Hyperlipidemia, unspecified: Secondary | ICD-10-CM | POA: Insufficient documentation

## 2015-11-25 DIAGNOSIS — I10 Essential (primary) hypertension: Secondary | ICD-10-CM | POA: Insufficient documentation

## 2015-11-25 DIAGNOSIS — I6782 Cerebral ischemia: Secondary | ICD-10-CM | POA: Insufficient documentation

## 2015-12-01 ENCOUNTER — Ambulatory Visit (INDEPENDENT_AMBULATORY_CARE_PROVIDER_SITE_OTHER): Payer: Medicare HMO | Admitting: Pharmacist

## 2015-12-01 DIAGNOSIS — Z7901 Long term (current) use of anticoagulants: Secondary | ICD-10-CM | POA: Diagnosis not present

## 2015-12-01 DIAGNOSIS — R790 Abnormal level of blood mineral: Secondary | ICD-10-CM | POA: Diagnosis not present

## 2015-12-01 DIAGNOSIS — D6861 Antiphospholipid syndrome: Secondary | ICD-10-CM | POA: Diagnosis not present

## 2015-12-01 DIAGNOSIS — R760 Raised antibody titer: Secondary | ICD-10-CM

## 2015-12-01 LAB — POCT INR: INR: 3.1

## 2015-12-01 NOTE — Progress Notes (Signed)
Anti-Coagulation Progress Note  [name] is a 70 y.o. female who is currently on an anti-coagulation regimen.    RECENT RESULTS: Recent results are below, the most recent result is correlated with a dose of 48 mg per week: Lab Results  Component Value Date   INR 3.10 12/01/2015   INR 2.30 11/03/2015   INR 2.00 09/01/2015   PROTIME 30.0* 03/13/2015    ANTI-COAG DOSE: Anticoagulation Dose Instructions as of 12/01/2015      [name] Tue Wed Thu Fri Sat   New Dose 6 mg 6 mg 6 mg 6 mg 9 mg 6 mg 6 mg       ANTICOAG SUMMARY: Anticoagulation Episode Summary    Current INR goal 2.0-3.0  Next INR check 12/15/2015  INR from last check 3.10! (12/01/2015)  Weekly max dose   Target end date   INR check location   Preferred lab   Send INR reminders to [place]   Indications  Antiphospholipid antibody syndrome ([place]) [D68.61] Lupus anticoagulant positive [R79.0]        Comments       Anticoagulation Care Providers    Provider Role Specialty Phone number   Annia Belt, MD Referring Oncology [phone]      ANTICOAG TODAY: Anticoagulation Summary as of 12/01/2015    INR goal 2.0-3.0  Selected INR 3.10! (12/01/2015)  Next INR check 12/15/2015  Target end date    Indications  Antiphospholipid antibody syndrome ([place]) [D68.61] Lupus anticoagulant positive [R79.0]      Anticoagulation Episode Summary    INR check location    Preferred lab    Send INR reminders to [place]   Comments     Anticoagulation Care Providers    Provider Role Specialty Phone number   Annia Belt, MD Referring Oncology [phone]      PATIENT INSTRUCTIONS: Patient Instructions  Patient instructed to take medications as defined in the Anti-coagulation Track section of this encounter.  Patient instructed to take today's dose.  Patient verbalized understanding of these instructions.       FOLLOW-UP Return in about 2 weeks (around 12/15/2015) for f/u INR @  215pm.  [name], III Pharm.D., CACP

## 2015-12-01 NOTE — Patient Instructions (Signed)
Patient instructed to take medications as defined in the Anti-coagulation Track section of this encounter.  Patient instructed to take today's dose.  Patient verbalized understanding of these instructions.    

## 2015-12-01 NOTE — Progress Notes (Signed)
Reviewed Who is coughing up blood - the patient or her sister? If she is coughing up blood we need to hold her coumadin or discuss with me. DrG

## 2015-12-15 ENCOUNTER — Ambulatory Visit (INDEPENDENT_AMBULATORY_CARE_PROVIDER_SITE_OTHER): Payer: Medicare HMO | Admitting: Pharmacist

## 2015-12-15 DIAGNOSIS — Z7901 Long term (current) use of anticoagulants: Secondary | ICD-10-CM | POA: Diagnosis not present

## 2015-12-15 DIAGNOSIS — R760 Raised antibody titer: Secondary | ICD-10-CM

## 2015-12-15 DIAGNOSIS — D6861 Antiphospholipid syndrome: Secondary | ICD-10-CM

## 2015-12-15 DIAGNOSIS — R790 Abnormal level of blood mineral: Secondary | ICD-10-CM

## 2015-12-15 LAB — POCT INR: INR: 2.5

## 2015-12-15 NOTE — Progress Notes (Signed)
Anti-Coagulation Progress Note  [name] is a 70 y.o. female who is currently on an anti-coagulation regimen.    RECENT RESULTS: Recent results are below, the most recent result is correlated with a dose of  45 mg. per week: Lab Results  Component Value Date   INR 2.5 12/15/2015   INR 3.10 12/01/2015   INR 2.30 11/03/2015   PROTIME 30.0* 03/13/2015    ANTI-COAG DOSE: Anticoagulation Dose Instructions as of 12/15/2015      [name] Tue Wed Thu Fri Sat   New Dose 6 mg 6 mg 6 mg 6 mg 9 mg 6 mg 6 mg       ANTICOAG SUMMARY: Anticoagulation Episode Summary    Current INR goal 2.0-3.0  Next INR check 01/12/2016  INR from last check   Weekly max dose   Target end date   INR check location   Preferred lab   Send INR reminders to [place]   Indications  Antiphospholipid antibody syndrome ([place]) [D68.61] Lupus anticoagulant positive [R79.0]        Comments       Anticoagulation Care Providers    Provider Role Specialty Phone number   Annia Belt, MD Referring Oncology [phone]      ANTICOAG TODAY: Anticoagulation Summary as of 12/15/2015    INR goal 2.0-3.0  Selected INR   Next INR check 01/12/2016  Target end date    Indications  Antiphospholipid antibody syndrome ([place]) [D68.61] Lupus anticoagulant positive [R79.0]      Anticoagulation Episode Summary    INR check location    Preferred lab    Send INR reminders to [place]   Comments     Anticoagulation Care Providers    Provider Role Specialty Phone number   Annia Belt, MD Referring Oncology [phone]      PATIENT INSTRUCTIONS: Patient Instructions  Patient instructed to take medications as defined in the Anti-coagulation Track section of this encounter.  Patient instructed to take today's dose.  Patient verbalized understanding of these instructions.       FOLLOW-UP Return in about 4 weeks (around 01/12/2016) for INR check.  Darl Pikes,  PharmD Clinical Pharmacist- Resident Pager: [phone]   [name], III Pharm.D., CACP

## 2015-12-15 NOTE — Patient Instructions (Signed)
Patient instructed to take medications as defined in the Anti-coagulation Track section of this encounter.  Patient instructed to take today's dose.  Patient verbalized understanding of these instructions.    

## 2015-12-16 NOTE — Progress Notes (Signed)
INTERNAL MEDICINE TEACHING ATTENDING ADDENDUM - Lalla Brothers M.D  Duration- indefinite, Indication- lupus anticoagulant, INR- therapeutic. Agree with pharmacy recommendations as outlined in their note.

## 2016-01-12 ENCOUNTER — Ambulatory Visit (INDEPENDENT_AMBULATORY_CARE_PROVIDER_SITE_OTHER): Payer: Medicare HMO | Admitting: Pharmacist

## 2016-01-12 DIAGNOSIS — Z7901 Long term (current) use of anticoagulants: Secondary | ICD-10-CM | POA: Diagnosis not present

## 2016-01-12 DIAGNOSIS — R790 Abnormal level of blood mineral: Secondary | ICD-10-CM

## 2016-01-12 DIAGNOSIS — R760 Raised antibody titer: Secondary | ICD-10-CM

## 2016-01-12 DIAGNOSIS — D6861 Antiphospholipid syndrome: Secondary | ICD-10-CM

## 2016-01-12 LAB — POCT INR: INR: 3.7

## 2016-01-12 NOTE — Patient Instructions (Signed)
Patient instructed to take medications as defined in the Anti-coagulation Track section of this encounter.  Patient instructed to take today's dose.  Patient verbalized understanding of these instructions.    

## 2016-01-12 NOTE — Progress Notes (Signed)
Anti-Coagulation Progress Note  [name] is a 70 y.o. female who is currently on an anti-coagulation regimen.    RECENT RESULTS: Recent results are below, the most recent result is correlated with a dose of 45 mg. per week: Lab Results  Component Value Date   INR 3.70 01/12/2016   INR 2.5 12/15/2015   INR 3.10 12/01/2015   PROTIME 30.0* 03/13/2015    ANTI-COAG DOSE: Anticoagulation Dose Instructions as of 01/12/2016      [name] Tue Wed Thu Fri Sat   New Dose 6 mg 6 mg 6 mg 6 mg 6 mg 6 mg 6 mg       ANTICOAG SUMMARY: Anticoagulation Episode Summary    Current INR goal 2.0-3.0  Next INR check 02/09/2016  INR from last check 3.70! (01/12/2016)  Weekly max dose   Target end date   INR check location   Preferred lab   Send INR reminders to [place]   Indications  Antiphospholipid antibody syndrome ([place]) [D68.61] Lupus anticoagulant positive [R79.0]        Comments       Anticoagulation Care Providers    Provider Role Specialty Phone number   Annia Belt, MD Referring Oncology [phone]      ANTICOAG TODAY: Anticoagulation Summary as of 01/12/2016    INR goal 2.0-3.0  Selected INR 3.70! (01/12/2016)  Next INR check 02/09/2016  Target end date    Indications  Antiphospholipid antibody syndrome ([place]) [D68.61] Lupus anticoagulant positive [R79.0]      Anticoagulation Episode Summary    INR check location    Preferred lab    Send INR reminders to [place]   Comments     Anticoagulation Care Providers    Provider Role Specialty Phone number   Annia Belt, MD Referring Oncology [phone]      PATIENT INSTRUCTIONS: Patient Instructions  Patient instructed to take medications as defined in the Anti-coagulation Track section of this encounter.  Patient instructed to take today's dose.  Patient verbalized understanding of these instructions.       FOLLOW-UP Return in 4 weeks (on 02/09/2016) for Follow up INR at  [place], III Pharm.D., CACP

## 2016-01-12 NOTE — Progress Notes (Signed)
Reviewed Thanks DrG 

## 2016-02-09 ENCOUNTER — Ambulatory Visit (INDEPENDENT_AMBULATORY_CARE_PROVIDER_SITE_OTHER): Payer: Medicare HMO | Admitting: Pharmacist

## 2016-02-09 DIAGNOSIS — D6861 Antiphospholipid syndrome: Secondary | ICD-10-CM

## 2016-02-09 DIAGNOSIS — R790 Abnormal level of blood mineral: Secondary | ICD-10-CM | POA: Diagnosis not present

## 2016-02-09 DIAGNOSIS — Z7901 Long term (current) use of anticoagulants: Secondary | ICD-10-CM

## 2016-02-09 DIAGNOSIS — R760 Raised antibody titer: Secondary | ICD-10-CM

## 2016-02-09 LAB — POCT INR: INR: 4.2

## 2016-02-09 MED ORDER — WARFARIN SODIUM 6 MG PO TABS: ORAL_TABLET | ORAL | Status: DC

## 2016-02-09 NOTE — Patient Instructions (Signed)
Patient instructed to take medications as defined in the Anti-coagulation Track section of this encounter.  Patient instructed to OMIT today's dose.  Patient verbalized understanding of these instructions.    

## 2016-02-09 NOTE — Progress Notes (Signed)
Reviewed Thanks DrG 

## 2016-02-09 NOTE — Progress Notes (Signed)
Anti-Coagulation Progress Note  [name] is a 70 y.o. female who is currently on an anti-coagulation regimen.    RECENT RESULTS: Recent results are below, the most recent result is correlated with a dose of 42 mg. per week: Lab Results  Component Value Date   INR 4.20 02/09/2016   INR 3.70 01/12/2016   INR 2.5 12/15/2015   PROTIME 30.0* 03/13/2015    ANTI-COAG DOSE: Anticoagulation Dose Instructions as of 02/09/2016      [name] Tue Wed Thu Fri Sat   New Dose 6 mg 6 mg 3 mg 6 mg 6 mg 3 mg 6 mg    Description        OMIT dose on Monday 17-JUL-17.       ANTICOAG SUMMARY: Anticoagulation Episode Summary    Current INR goal 2.0-3.0  Next INR check 02/23/2016  INR from last check 4.20! (02/09/2016)  Weekly max dose   Target end date   INR check location   Preferred lab   Send INR reminders to [place]   Indications  Antiphospholipid antibody syndrome ([place]) [D68.61] Lupus anticoagulant positive [R79.0]        Comments       Anticoagulation Care Providers    Provider Role Specialty Phone number   Annia Belt, MD Referring Oncology [phone]      ANTICOAG TODAY: Anticoagulation Summary as of 02/09/2016    INR goal 2.0-3.0  Selected INR 4.20! (02/09/2016)  Next INR check 02/23/2016  Target end date    Indications  Antiphospholipid antibody syndrome ([place]) [D68.61] Lupus anticoagulant positive [R79.0]      Anticoagulation Episode Summary    INR check location    Preferred lab    Send INR reminders to [place]   Comments     Anticoagulation Care Providers    Provider Role Specialty Phone number   Annia Belt, MD Referring Oncology [phone]      PATIENT INSTRUCTIONS: Patient Instructions  Patient instructed to take medications as defined in the Anti-coagulation Track section of this encounter.  Patient instructed to OMIT today's dose.  Patient verbalized understanding of these instructions.        FOLLOW-UP Return in 2 weeks (on 02/23/2016) for Follow up INR at 2:15PM.  [name], III Pharm.D., CACP

## 2016-02-17 ENCOUNTER — Encounter (HOSPITAL_COMMUNITY): Payer: Self-pay | Admitting: Emergency Medicine

## 2016-02-17 ENCOUNTER — Emergency Department (HOSPITAL_COMMUNITY): Payer: Medicare HMO

## 2016-02-17 ENCOUNTER — Telehealth: Payer: Self-pay

## 2016-02-17 ENCOUNTER — Emergency Department (HOSPITAL_COMMUNITY)
Admission: EM | Admit: 2016-02-17 | Discharge: 2016-02-17 | Disposition: A | Discharge status: Home / Self Care | Payer: Medicare HMO | Attending: Emergency Medicine | Admitting: Emergency Medicine

## 2016-02-17 DIAGNOSIS — R51 Headache: Secondary | ICD-10-CM | POA: Insufficient documentation

## 2016-02-17 DIAGNOSIS — R42 Dizziness and giddiness: Secondary | ICD-10-CM | POA: Insufficient documentation

## 2016-02-17 DIAGNOSIS — E039 Hypothyroidism, unspecified: Secondary | ICD-10-CM | POA: Insufficient documentation

## 2016-02-17 DIAGNOSIS — J45909 Unspecified asthma, uncomplicated: Secondary | ICD-10-CM | POA: Insufficient documentation

## 2016-02-17 DIAGNOSIS — Z853 Personal history of malignant neoplasm of breast: Secondary | ICD-10-CM | POA: Diagnosis not present

## 2016-02-17 DIAGNOSIS — Z8673 Personal history of transient ischemic attack (TIA), and cerebral infarction without residual deficits: Secondary | ICD-10-CM | POA: Insufficient documentation

## 2016-02-17 DIAGNOSIS — Z7901 Long term (current) use of anticoagulants: Secondary | ICD-10-CM | POA: Diagnosis not present

## 2016-02-17 DIAGNOSIS — I10 Essential (primary) hypertension: Secondary | ICD-10-CM | POA: Diagnosis not present

## 2016-02-17 DIAGNOSIS — R519 Headache, unspecified: Secondary | ICD-10-CM

## 2016-02-17 DIAGNOSIS — Z7982 Long term (current) use of aspirin: Secondary | ICD-10-CM | POA: Insufficient documentation

## 2016-02-17 LAB — BASIC METABOLIC PANEL
Anion gap: 7 (ref 5–15)
BUN: 26 mg/dL — ABNORMAL HIGH (ref 6–20)
CO2: 24 mmol/L (ref 22–32)
Calcium: 8.8 mg/dL — ABNORMAL LOW (ref 8.9–10.3)
Chloride: 105 mmol/L (ref 101–111)
Creatinine, Ser: 1.7 mg/dL — ABNORMAL HIGH (ref 0.44–1.00)
GFR calc Af Amer: 35 mL/min — ABNORMAL LOW (ref >60)
GFR calc non Af Amer: 30 mL/min — ABNORMAL LOW (ref >60)
Glucose, Bld: 77 mg/dL (ref 65–99)
Potassium: 4.3 mmol/L (ref 3.5–5.1)
Sodium: 136 mmol/L (ref 135–145)

## 2016-02-17 LAB — CBC
HCT: 38.4 % (ref 36.0–46.0)
Hemoglobin: 12.5 g/dL (ref 12.0–15.0)
MCH: 30.7 pg (ref 26.0–34.0)
MCHC: 32.6 g/dL (ref 30.0–36.0)
MCV: 94.3 fL (ref 78.0–100.0)
Platelets: 258 10*3/uL (ref 150–400)
RBC: 4.07 MIL/uL (ref 3.87–5.11)
RDW: 13.9 % (ref 11.5–15.5)
WBC: 8.1 10*3/uL (ref 4.0–10.5)

## 2016-02-17 LAB — PROTIME-INR
INR: 1.83 — ABNORMAL HIGH (ref 0.00–1.49)
Prothrombin Time: 21.1 seconds — ABNORMAL HIGH (ref 11.6–15.2)

## 2016-02-17 MED ORDER — MECLIZINE HCL 25 MG PO TABS: 25.0000 mg | ORAL_TABLET | Freq: Three times a day (TID) | ORAL | 0 refills | Status: DC | PRN

## 2016-02-17 MED ORDER — MECLIZINE HCL 25 MG PO TABS
25.0000 mg | ORAL_TABLET | Freq: Once | ORAL | Status: AC
  Administered 2016-02-17: 25 mg via ORAL
  Filled 2016-02-17: qty 1

## 2016-02-17 NOTE — ED Notes (Signed)
Patient returned from MRI.

## 2016-02-17 NOTE — ED Triage Notes (Addendum)
Pt sts near syncope when she lays down and some discoloration to legs; pt sts on coumadin for clotting issue; pt sts HA today

## 2016-02-17 NOTE — Telephone Encounter (Signed)
Needs to speak with a nurse regarding coumadin.

## 2016-02-17 NOTE — ED Notes (Signed)
Patient transported to MRI 

## 2016-02-17 NOTE — ED Notes (Signed)
Patient Alert and oriented X4. Stable and ambulatory. Patient verbalized understanding of the discharge instructions.  Patient belongings were taken by the patient.  

## 2016-02-17 NOTE — ED Provider Notes (Signed)
[facility] DEPT Provider Note   CSN: [id number] Arrival date & time: 02/17/16  1713  First Provider Contact:  None       History   Chief Complaint Chief Complaint  Patient presents with  . Dizziness    HPI [name] is a 70 y.o. female.  HPI Patient states she has developed a headache on the right side of her head. She reports it is sharp and goes to the back of her head and down her neck slightly. She states the headache started about 3 days ago. It has been constant and severe. She states she's had several episodes that she thought she might black out. She describes it as a dizziness but she reports it almost feels like her vision is going to go dark and she might fall. She has not had focal weakness numbness or tingling of extremities. She has not had a gait dysfunction. Some nausea without vomiting. No fevers or chills. No actual visual loss or visual change except for during episodes where she feels that everything is going dark. These are actually worse at night when she is lying down. She reports this occurs and that she is afraid to get up. Patient is on anticoagulants. 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 Past Medical History:  Diagnosis Date  . Anxiety    severe panic attacks  . Arthritis   . Asthma    as child  . Breast cancer ([place])    right  . Chest pain   . Chronic anticoagulation 08/04/2015  . Collapsed lung    hx of, left  . Complication of anesthesia    "hard to put asleep"  . Depression   . Fibromyalgia   . GERD (gastroesophageal reflux disease)   . H/O hiatal hernia   . Headache(784.0)   . Hepatitis 1990   "from eating at restaurant"  . History of antiphospholipid antibody syndrome   . 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  . Hx-TIA (transient ischemic attack)   . Hyperlipidemia   . Hypertension   . Hypothyroidism   . Neuromuscular disorder ([place])   . Pneumonia    hx of  . Stroke ([place])   . 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 Chronic anticoagulation 08/04/2015  . Pain in limb 12/26/2013  . Chest pain 11/27/2013  . GERD - post failed open Nissen 11/23/2012  . Dysphagia, unspecified(787.20) 09/28/2012  . S/P Open Nissen 1998 Dr. Mendel Ryder 08/18/2012  . 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 Antiphospholipid antibody syndrome ([place]) 06/01/2011  . Lupus anticoagulant positive 06/01/2011  . Chest pain   . Hx-TIA (transient ischemic attack)   . Hypothyroidism   . Breast cancer ([place])   . History of DVT (deep vein thrombosis)   . History of antiphospholipid antibody syndrome     Past Surgical History:  Procedure Laterality Date  . ABDOMINAL HYSTERECTOMY    . BREAST SURGERY Right    x3  . BUNIONECTOMY Bilateral 30 years ago  . CARDIAC CATHETERIZATION  07/12/1997   NORMAL LEFT VENTRICULAR FUNCTION WITH EF AT LEAST 70-75%  . ESOPHAGEAL MANOMETRY N/A 11/06/2012   Procedure: ESOPHAGEAL MANOMETRY (EM);  Surgeon: Pedro Earls, MD;  Location: [facility] END[facility]COPY;  Service: General;  Laterality: N/A;  . ESOPHAGOGASTRODUODENOSCOPY ENDOSCOPY    .  KNEE ARTHROSCOPY Right   . LAPAROSCOPIC NISSEN FUNDOPLICATION N/A [id number]   Procedure: LAPAROSCOPIC TAKEDOWN  PERI-HIATAL HERNIA    REPAIR;  Surgeon: Pedro Earls, MD;  Location: [facility] ORS;  Service: General;  Laterality: N/A;  . [place]  . RIGHT OOPHORECTOMY  1980  . THYROIDECTOMY  in 20's  . TONSILLECTOMY  70 years old  . UPPER GI ENDOSCOPY N/A 12/12/2012   Procedure: UPPER GI ENDOSCOPY;  Surgeon: Pedro Earls, MD;  Location: [facility] ORS;  Service: General;  Laterality: N/A;    OB History    No data available       Home Medications    Prior to Admission medications   Medication Sig Start Date End Date Taking? Authorizing Provider  ALPRAZolam [name]) 0.5 MG tablet  Take 0.5 mg by mouth 2 (two) times daily. 06/07/12  Yes Historical Provider, MD  aspirin 81 MG tablet Take 81 mg by mouth daily.    Yes Historical Provider, MD  buPROPion (WELLBUTRIN SR) 200 MG 12 hr tablet Take 200 mg by mouth 2 (two) times daily.    Yes Historical Provider, MD  Calcium Carbonate-Vitamin D (CALCIUM + D PO) Take 600 mg by mouth daily.    Yes Historical Provider, MD  fluticasone (FLONASE) 50 MCG/ACT nasal spray Place 2 sprays into the nose daily as needed for allergies.  10/16/14  Yes Historical Provider, MD  HYDROcodone-acetaminophen (NORCO/VICODIN) 5-325 MG per tablet Take 1 tablet by mouth every 6 (six) hours as needed for pain.    Yes Historical Provider, MD  levothyroxine (SYNTHROID, LEVOTHROID) 112 MCG tablet Take 112 mcg by mouth daily before breakfast.   Yes Historical Provider, MD  metoprolol succinate (TOPROL-XL) 25 MG 24 hr tablet Take 12.5 mg by mouth 2 (two) times daily.    Yes Historical Provider, MD  omeprazole (PRILOSEC) 20 MG capsule Take 20 mg by mouth daily.  02/06/13  Yes Historical Provider, MD  pravastatin (PRAVACHOL) 20 MG tablet Take 20 mg by mouth daily.   Yes Christain Sacramento, MD  pseudoephedrine (SUDAFED) 120 MG 12 hr tablet Take 120 mg by mouth daily as needed for congestion.    Yes Historical Provider, MD  rOPINIRole (REQUIP) 1 MG tablet Take 1 mg by mouth 2 (two) times daily.    Yes Historical Provider, MD  warfarin (COUMADIN) 6 MG tablet Take 1/2 tablet on Tuesdays and Fridays; all other days--take 1 tablet. Patient taking differently: Take 6 mg by mouth every evening.  02/09/16  Yes Annia Belt, MD  meclizine (ANTIVERT) 25 MG tablet Take 1 tablet (25 mg total) by mouth 3 (three) times daily as needed for dizziness. 02/17/16   Charlesetta Shanks, MD    Family History Family History  Problem Relation Age of Onset  . Cancer Maternal Grandmother     colon  . Alzheimer's disease Mother   . Heart disease Mother   . Hyperlipidemia Mother   . Hypertension  Mother   . Heart disease Sister   . Hypertension Sister   . Heart attack Sister   . Heart disease Brother   . Heart attack Brother   . Cancer Daughter   . Hypertension Daughter   . Hypertension Son     Social History Social History  Substance Use Topics  . Smoking status: Never Smoker  . Smokeless tobacco: Never Used  . Alcohol use No     Allergies   Bee venom; Hornet venom; Reglan [metoclopramide]; Vitamin k; Vitamin k and related;  Benadryl [diphenhydramine hcl]; Crestor [rosuvastatin calcium]; Doxycycline; Levofloxacin; Zocor [simvastatin]; Ciprofloxacin; Daypro [oxaprozin]; Levofloxacin; Phytonadione; and Sulfonamide derivatives   Review of Systems Review of Systems 10 Systems reviewed and are negative for acute change except as noted in the HPI.   Physical Exam Updated Vital Signs BP 125/72   Pulse 64   Temp 98 F (36.7 C) (Oral)   Resp 11   SpO2 98%   Physical Exam  Constitutional: She is oriented to person, place, and time. She appears well-developed and well-nourished.  HENT:  Head: Normocephalic and atraumatic.  Right Ear: External ear normal.  Left Ear: External ear normal.  Nose: Nose normal.  Mouth/Throat: No oropharyngeal exudate.  Eyes: EOM are normal. Pupils are equal, round, and reactive to light. No scleral icterus.  Neck: Neck supple.  Cardiovascular: Normal rate, regular rhythm, normal heart sounds and intact distal pulses.   Pulmonary/Chest: Effort normal and breath sounds normal.  Abdominal: Soft. She exhibits no distension. There is no tenderness. There is no guarding.  Musculoskeletal: Normal range of motion. She exhibits no edema or deformity.  Patient poor she has is some tenderness at the left calf. She perceives an area where the skin is dented in. Findings are consistent with chronic varicosity. There is no peripheral edema and no cellulitis.  Neurological: She is alert and oriented to person, place, and time. No cranial nerve deficit.  She exhibits normal muscle tone. Coordination normal.  Cognitive function and speech are normal.  Skin: Skin is warm and dry.  Psychiatric: She has a normal mood and affect.     ED Treatments / Results  Labs (all labs ordered are listed, but only abnormal results are displayed) Labs Reviewed  BASIC METABOLIC PANEL - Abnormal; Notable for the following:       Result Value   BUN 26 (*)    Creatinine, Ser 1.70 (*)    Calcium 8.8 (*)    GFR calc non Af Amer 30 (*)    GFR calc Af Amer 35 (*)    All other components within normal limits  PROTIME-INR - Abnormal; Notable for the following:    Prothrombin Time 21.1 (*)    INR 1.83 (*)    All other components within normal limits  CBC    EKG  EKG Interpretation None       Radiology Ct Head Wo Contrast  Result Date: 02/17/2016 CLINICAL DATA:  Syncopal episodes since yesterday.  Headache. EXAM: CT HEAD WITHOUT CONTRAST TECHNIQUE: Contiguous axial images were obtained from the base of the skull through the vertex without intravenous contrast. COMPARISON:  03/05/2012 FINDINGS: The brain shows mild generalized age related atrophy. No evidence of old or acute small or large vessel infarction, mass lesion, hemorrhage, hydrocephalus or extra-axial collection. The calvarium is unremarkable. There is mucosal thickening affecting the right maxillary sinus. IMPRESSION: Normal intracranial exam for a person of this age. Mild age related atrophy. Right maxillary sinus mucosal thickening. Electronically Signed   By: Nelson Chimes M.D.   On: 02/17/2016 17:52  Mr Angiogram Head Wo Contrast  Result Date: 02/17/2016 CLINICAL DATA:  70 year old hypertensive female with near syncope. Clotting disorder on Coumadin. Headache right-side. Subsequent encounter. EXAM: MRI HEAD WITHOUT CONTRAST MRA HEAD WITHOUT CONTRAST TECHNIQUE: Multiplanar, multiecho pulse sequences of the brain and surrounding structures were obtained without intravenous contrast. Angiographic  images of the head were obtained using MRA technique without contrast. COMPARISON:  02/17/2016 and 01/04/2012 head CT. 11/24/2008 MR and MR angiogram. FINDINGS: MRI HEAD FINDINGS  No acute infarct or intracranial hemorrhage. Scattered small remote infarcts/chronic microvascular changes. No intracranial mass lesion noted on this unenhanced exam. Mild global atrophy without hydrocephalus. Major intracranial vascular structures are patent. No evidence of major dural sinus thrombosis. Prominent opacification right maxillary sinus with mucosal thickening measuring up to 1.3 cm. No acute orbital abnormality. Cervical spondylotic changes most notable C4-5 with slight cord flattening (noted on 2013 cervical spine MR). Transverse ligament hypertrophy. Partially empty non expanded sella. Cerebellar tonsils minimally low lying, unchanged and within normal limits. MRA HEAD FINDINGS Anterior circulation without medium or large size vessel significant stenosis or occlusion. Fetal contribution to the posterior cerebral artery bilaterally. Left vertebral artery ends in a posterior inferior cerebellar artery distribution. Ectatic basilar artery without significant stenosis. Poor delineation left anterior inferior cerebellar artery. No aneurysm noted. IMPRESSION: MRI HEAD No acute infarct or intracranial hemorrhage. Scattered small remote infarcts/chronic microvascular changes. Mild global atrophy without hydrocephalus. Major intracranial vascular structures are patent. No evidence of major dural sinus thrombosis. Prominent opacification right maxillary sinus with mucosal thickening measuring up to 1.3 cm. Cervical spondylotic changes most notable C4-5 with slight cord flattening (noted on 2013 cervical spine MR). Transverse ligament hypertrophy. Cerebellar tonsils minimally low lying, unchanged and within normal limits. MRA HEAD Anterior circulation without medium or large size vessel significant stenosis or occlusion. Fetal  contribution to the posterior cerebral artery bilaterally. Left vertebral artery ends in a posterior inferior cerebellar artery distribution. No significant stenosis of the right vertebral artery or basilar artery. Electronically Signed   By: Genia Del M.D.   On: 02/17/2016 21:18  Mr Brain Wo Contrast  Result Date: 02/17/2016 CLINICAL DATA:  70 year old hypertensive female with near syncope. Clotting disorder on Coumadin. Headache right-side. Subsequent encounter. EXAM: MRI HEAD WITHOUT CONTRAST MRA HEAD WITHOUT CONTRAST TECHNIQUE: Multiplanar, multiecho pulse sequences of the brain and surrounding structures were obtained without intravenous contrast. Angiographic images of the head were obtained using MRA technique without contrast. COMPARISON:  02/17/2016 and 01/04/2012 head CT. 11/24/2008 MR and MR angiogram. FINDINGS: MRI HEAD FINDINGS No acute infarct or intracranial hemorrhage. Scattered small remote infarcts/chronic microvascular changes. No intracranial mass lesion noted on this unenhanced exam. Mild global atrophy without hydrocephalus. Major intracranial vascular structures are patent. No evidence of major dural sinus thrombosis. Prominent opacification right maxillary sinus with mucosal thickening measuring up to 1.3 cm. No acute orbital abnormality. Cervical spondylotic changes most notable C4-5 with slight cord flattening (noted on 2013 cervical spine MR). Transverse ligament hypertrophy. Partially empty non expanded sella. Cerebellar tonsils minimally low lying, unchanged and within normal limits. MRA HEAD FINDINGS Anterior circulation without medium or large size vessel significant stenosis or occlusion. Fetal contribution to the posterior cerebral artery bilaterally. Left vertebral artery ends in a posterior inferior cerebellar artery distribution. Ectatic basilar artery without significant stenosis. Poor delineation left anterior inferior cerebellar artery. No aneurysm noted. IMPRESSION:  MRI HEAD No acute infarct or intracranial hemorrhage. Scattered small remote infarcts/chronic microvascular changes. Mild global atrophy without hydrocephalus. Major intracranial vascular structures are patent. No evidence of major dural sinus thrombosis. Prominent opacification right maxillary sinus with mucosal thickening measuring up to 1.3 cm. Cervical spondylotic changes most notable C4-5 with slight cord flattening (noted on 2013 cervical spine MR). Transverse ligament hypertrophy. Cerebellar tonsils minimally low lying, unchanged and within normal limits. MRA HEAD Anterior circulation without medium or large size vessel significant stenosis or occlusion. Fetal contribution to the posterior cerebral artery bilaterally. Left vertebral artery ends in a posterior inferior cerebellar  artery distribution. No significant stenosis of the right vertebral artery or basilar artery. Electronically Signed   By: Genia Del M.D.   On: 02/17/2016 21:18   Procedures Procedures (including critical care time)  Medications Ordered in ED Medications  meclizine (ANTIVERT) tablet 25 mg (not administered)     Initial Impression / Assessment and Plan / ED Course  I have reviewed the triage vital signs and the nursing notes.  Pertinent labs & imaging results that were available during my care of the patient were reviewed by me and considered in my medical decision making (see chart for details).  Clinical Course     Final Clinical Impressions(s) / ED Diagnoses   Final diagnoses:  Vertigo  Acute nonintractable headache, unspecified headache type  Patient experiencing vertigo with right-sided headache. No focal neurologic deficit. CT has ruled out dissection, aneurysm or cerebellar stroke. Patient does have thickening of the right maxillary sinus. She however has not had fevers or significant drainage. She does not have facial swelling. Patient has multiple antibiotic allergies. At this time, it does not  appear to be significant bacterial sinusitis. We will defer antibiotic administration and had the patient follow up with her primary care doctor. He will be given meclizine for vertiginous type symptoms. She is well in appearance, alert and appropriate with otherwise normal exam.  New Prescriptions New Prescriptions   MECLIZINE (ANTIVERT) 25 MG TABLET    Take 1 tablet (25 mg total) by mouth 3 (three) times daily as needed for dizziness.     Charlesetta Shanks, MD 02/17/16 2149

## 2016-02-17 NOTE — Telephone Encounter (Signed)
Spoke w/ pt, she is on her way to ED

## 2016-02-17 NOTE — Telephone Encounter (Signed)
Pt calls and states the lower legs are brown, L ankle is swollen. She states she has "holes" in her legs but they are not open on the outside, you can feel the holes on the inside??? She states she has a horrible h/a and knows her INR needs to be checked Please advise

## 2016-02-17 NOTE — Telephone Encounter (Signed)
Don't take coumadin today. Get to nearest Emergency department for eval. Lab check. May need CT brain.

## 2016-02-17 NOTE — ED Notes (Signed)
Patient transported to CT before coming to room.  Family member brought back to room.

## 2016-02-18 ENCOUNTER — Telehealth: Payer: Self-pay | Admitting: Oncology

## 2016-02-18 NOTE — Telephone Encounter (Signed)
Calling to talk to the nurse

## 2016-02-18 NOTE — Telephone Encounter (Signed)
Spoke w/ pt, called dr Beryle Beams, gave him INR and PT results from 7/25 visit to [facility] Pt is to take 6mg  coumadin wed, thurs, fri, sat, sun Pt is to come to coumadin clinic appt mon 7/31 Pt repeated instructions back Call ended

## 2016-02-20 ENCOUNTER — Emergency Department (HOSPITAL_COMMUNITY)
Admission: EM | Admit: 2016-02-20 | Discharge: 2016-02-20 | Disposition: A | Discharge status: Home / Self Care | Payer: Medicare HMO | Attending: Emergency Medicine | Admitting: Emergency Medicine

## 2016-02-20 ENCOUNTER — Emergency Department (HOSPITAL_COMMUNITY): Payer: Medicare HMO

## 2016-02-20 ENCOUNTER — Encounter: Payer: Self-pay | Admitting: Oncology

## 2016-02-20 ENCOUNTER — Telehealth: Payer: Self-pay | Admitting: *Deleted

## 2016-02-20 ENCOUNTER — Encounter (HOSPITAL_COMMUNITY): Payer: Self-pay

## 2016-02-20 DIAGNOSIS — J45909 Unspecified asthma, uncomplicated: Secondary | ICD-10-CM | POA: Insufficient documentation

## 2016-02-20 DIAGNOSIS — R7989 Other specified abnormal findings of blood chemistry: Secondary | ICD-10-CM

## 2016-02-20 DIAGNOSIS — E039 Hypothyroidism, unspecified: Secondary | ICD-10-CM | POA: Diagnosis not present

## 2016-02-20 DIAGNOSIS — I10 Essential (primary) hypertension: Secondary | ICD-10-CM | POA: Insufficient documentation

## 2016-02-20 DIAGNOSIS — Z791 Long term (current) use of non-steroidal anti-inflammatories (NSAID): Secondary | ICD-10-CM | POA: Diagnosis not present

## 2016-02-20 DIAGNOSIS — D68318 Other hemorrhagic disorder due to intrinsic circulating anticoagulants, antibodies, or inhibitors: Secondary | ICD-10-CM | POA: Insufficient documentation

## 2016-02-20 DIAGNOSIS — R748 Abnormal levels of other serum enzymes: Secondary | ICD-10-CM | POA: Insufficient documentation

## 2016-02-20 DIAGNOSIS — Z853 Personal history of malignant neoplasm of breast: Secondary | ICD-10-CM | POA: Insufficient documentation

## 2016-02-20 DIAGNOSIS — R042 Hemoptysis: Secondary | ICD-10-CM

## 2016-02-20 DIAGNOSIS — R093 Abnormal sputum: Secondary | ICD-10-CM | POA: Diagnosis not present

## 2016-02-20 DIAGNOSIS — Z7901 Long term (current) use of anticoagulants: Secondary | ICD-10-CM

## 2016-02-20 LAB — CBC WITH DIFFERENTIAL/PLATELET
Basophils Absolute: 0.1 10*3/uL (ref 0.0–0.1)
Basophils Relative: 1 %
Eosinophils Absolute: 0.3 10*3/uL (ref 0.0–0.7)
Eosinophils Relative: 3 %
HCT: 41.2 % (ref 36.0–46.0)
Hemoglobin: 13.6 g/dL (ref 12.0–15.0)
Lymphocytes Relative: 33 %
Lymphs Abs: 3 10*3/uL (ref 0.7–4.0)
MCH: 30.9 pg (ref 26.0–34.0)
MCHC: 33 g/dL (ref 30.0–36.0)
MCV: 93.6 fL (ref 78.0–100.0)
Monocytes Absolute: 0.4 10*3/uL (ref 0.1–1.0)
Monocytes Relative: 4 %
Neutro Abs: 5.5 10*3/uL (ref 1.7–7.7)
Neutrophils Relative %: 59 %
Platelets: 284 10*3/uL (ref 150–400)
RBC: 4.4 MIL/uL (ref 3.87–5.11)
RDW: 14.1 % (ref 11.5–15.5)
WBC: 9.2 10*3/uL (ref 4.0–10.5)

## 2016-02-20 LAB — BASIC METABOLIC PANEL
Anion gap: 7 (ref 5–15)
BUN: 23 mg/dL — ABNORMAL HIGH (ref 6–20)
CO2: 27 mmol/L (ref 22–32)
Calcium: 9 mg/dL (ref 8.9–10.3)
Chloride: 102 mmol/L (ref 101–111)
Creatinine, Ser: 1.72 mg/dL — ABNORMAL HIGH (ref 0.44–1.00)
GFR calc Af Amer: 34 mL/min — ABNORMAL LOW (ref >60)
GFR calc non Af Amer: 29 mL/min — ABNORMAL LOW (ref >60)
Glucose, Bld: 94 mg/dL (ref 65–99)
Potassium: 4.1 mmol/L (ref 3.5–5.1)
Sodium: 136 mmol/L (ref 135–145)

## 2016-02-20 LAB — PROTIME-INR
INR: 1.94
Prothrombin Time: 22.5 seconds — ABNORMAL HIGH (ref 11.4–15.2)

## 2016-02-20 MED ORDER — AZITHROMYCIN 250 MG PO TABS: ORAL_TABLET | ORAL | 0 refills | Status: DC

## 2016-02-20 NOTE — ED Notes (Addendum)
Pt ambulated to restroom without any difficulty or assistance.

## 2016-02-20 NOTE — Consult Note (Signed)
Hematology consultation:   Patient called our office from the [facility] ED. 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 Subsequent dx of antiphospholipid antibody syndrome. She called 7/25 with c/o leg swelling & severe headache. Advised to go to ED immediately. CT brain no bleed. INR subtherapeutic at 1.8. Creatinine 1.7 c/w prior 1.3. Hb stable @ 12.5 Coumadin dose was 6 mg daily except 3 mg on Tues & Fri. Advised on Thurs 7/26 to take 6 mg coumadin daily including Friday and come to office 7/31 for PT/INR.  Now presents w low grade hematemesis. PMH includes HH S/P Nissen fundoplication by Dr Kaylyn Lim 12/12/12 which was a re-do procedure. Last  GI procedure done 11/28/2012 was an esophageal manometry study in view of chronic dysphagia and reflux. Study was normal.  History of early stage breast cancer, estrogen receptor positive. Hypothyroid on replacement.  She reports problems with chronic sinus drainage, usually white, frothy. This morning around 8 AM she noted blood mixed with mucus. This has recurred about 5 times over the course of the day. She denies any acute or chronic cough. She is a never smoker. No fever. No vomiting. No hematochezia or melena. She has had intermittent right flank pain. No dysuria or frequency. No gross hematuria. She admits to some mild intermittent epigastric pain.  No current facility-administered medications for this encounter.   Current Outpatient Prescriptions:  .  ALPRAZolam (XANAX) 0.5 MG tablet, Take 0.5 mg by mouth 2 (two) times daily., Disp: , Rfl:  .  aspirin 81 MG tablet, Take 81 mg by mouth daily. , Disp: , Rfl:  .  buPROPion (WELLBUTRIN SR) 200 MG 12 hr tablet, Take 200 mg by mouth 2 (two) times daily. , Disp: , Rfl:  .  Calcium Carbonate-Vitamin D (CALCIUM + D PO), Take 600 mg by mouth daily. , Disp: , Rfl:  .  fluticasone (FLONASE) 50 MCG/ACT nasal spray, Place 2 sprays into the nose daily as  needed for allergies. , Disp: , Rfl:  .  HYDROcodone-acetaminophen (NORCO/VICODIN) 5-325 MG per tablet, Take 1 tablet by mouth every 6 (six) hours as needed for pain. , Disp: , Rfl:  .  levothyroxine (SYNTHROID, LEVOTHROID) 112 MCG tablet, Take 112 mcg by mouth daily before breakfast., Disp: , Rfl:  .  meclizine (ANTIVERT) 25 MG tablet, Take 1 tablet (25 mg total) by mouth 3 (three) times daily as needed for dizziness., Disp: 30 tablet, Rfl: 0 .  metoprolol succinate (TOPROL-XL) 25 MG 24 hr tablet, Take 12.5 mg by mouth 2 (two) times daily. , Disp: , Rfl:  .  omeprazole (PRILOSEC) 20 MG capsule, Take 20 mg by mouth daily. , Disp: , Rfl:  .  pravastatin (PRAVACHOL) 20 MG tablet, Take 20 mg by mouth daily., Disp: , Rfl:  .  pseudoephedrine (SUDAFED) 120 MG 12 hr tablet, Take 120 mg by mouth daily as needed for congestion. , Disp: , Rfl:  .  rOPINIRole (REQUIP) 1 MG tablet, Take 1 mg by mouth 2 (two) times daily. , Disp: , Rfl:  .  warfarin (COUMADIN) 6 MG tablet, Take 1/2 tablet on Tuesdays and Fridays; all other days--take 1 tablet. (Patient taking differently: Take 6 mg by mouth every evening. ), Disp: 72 tablet, Rfl: 1 pending  Physical exam: Blood pressure 120/80, pulse 78 regular, respirations 20, temperature 98.1. Oxygen saturation 100% on room air. Oropharynx without any sinus drainage and no blood in the posterior oropharynx. Teeth in good  repair. Neck is supple. Carotids 2+. No cervical supraclavicular or axillary adenopathy Lungs are clear to auscultation and resonant to percussion Regular cardiac rhythm without murmur or gallop The abdomen is soft and minimally tender on deep palpation in the epigastric region. No gross mass or organomegaly No costovertebral angle tenderness Extremities trace edema. No calf tenderness. Minor changes of her skin with some indentation areas which she is concerned about. No ulceration. Neurologic grossly normal. She is awake alert and oriented. Motor  strength is 5 over 5. Pupils equal round reactive to light.  Lab: Pending  Impression: Bloody mucus with history above suggesting that this is a sinus problem and not actually hemoptysis or hematemesis. She is on chronic Coumadin anticoagulation. However, I doubt she will be supratherapeutic on her coumadin after just taking two additional doses since 7/25. If confirmed that INR in normal range or still subtherapeutic, would explore other reasons for oropharyngeal/UGI bleeding. At this point it seems most likely that she has a local in nose and throat problem. Blood clot occurred such a long time ago that if we have to hold or partially reverse coumadin for nasal scope or endoscopy, this should not be a problem and risk for thrombosis should be low.  Murriel Hopper, MD, [place]  Hematology-Oncology/Internal Medicine [phone]

## 2016-02-20 NOTE — Progress Notes (Signed)
Patient called our office from the [facility] ED. 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 Subsequent dx of antiphospholipid antibody syndrome. She called 7/25 with c/o leg swelling & severe headache. Advised to go to ED immediately. CT brain no bleed. INR subtherapeutic at 1.8. Creatinine 1.7 c/w prior 1.3. Hb stable @ 12.5 Coumadin dose was 6 mg daily except 3 mg on Tues & Fri. Advised on Thurs 7/26 to take 6 mg coumadin daily including Friday and come to office 7/31 for PT/INR.  Now presents w low grade hematemesis. PMH includes HH S/P Nissen fundoplication by Dr Kaylyn Lim 12/12/12 which was a re-do procedure. Lab pending. I doubt she will be supratherapeutic on her coumadin after just taking one additional dose since 7/25. If confirmed that INR in normal range or still subtherapeutic, would explore other reasons for UGI bleed. Blood clot occurred such a long time ago that if we have to hold or partially reverse coumadin for endoscopy, risk for thrombosis should be low.  Murriel Hopper, MD, [place]  Hematology-Oncology/Internal Medicine [phone]

## 2016-02-20 NOTE — Telephone Encounter (Signed)
Patient called to inform us that she is in the ED per her family doctor d/t "spitting blood". "The PA said that I should let you guys know."

## 2016-02-20 NOTE — ED Provider Notes (Signed)
[facility] DEPT Provider Note   CSN: [id number] Arrival date & time: 02/20/16  1503  First Provider Contact:  None       History   Chief Complaint Chief Complaint  Patient presents with  . Hemoptysis    HPI [name] is a 70 y.o. female who presents emergency Department with 4 blood in her mucus. She noticed 5 episodes today. She has a past medical history of antiphospholipid antibiotic use and is on chronic Coumadin for the past 20 years. She is followed by Dr. Beryle Beams who called on the patient here in the emergency department today. Please see his note in Epic on 02/20/2016. The patient was found to be subtherapeutic on her INR last week after she caught it with a severe headache and had a CT scan that showed no acute abnormalities, however, showed chronic pansinusitis. The patient has taken extra Coumadin doses. Today, she called Dr. Beryle Beams because she noticed blood in her sputum. She is afraid her Coumadin level might be too high. She denies symptoms of anemia such as racing or skipping in her heart, feelings of lightheadedness or presyncope. She denies shortness of breath. She denies cough, hematemesis, hematuria, hematochezia or melena.  HPI  Past Medical History:  Diagnosis Date  . Anxiety    severe panic attacks  . Arthritis   . Asthma    as child  . Breast cancer ([place])    right  . Chest pain   . Chronic anticoagulation 08/04/2015  . Collapsed lung    hx of, left  . Complication of anesthesia    "hard to put asleep"  . Depression   . Fibromyalgia   . GERD (gastroesophageal reflux disease)   . H/O hiatal hernia   . Headache(784.0)   . Hepatitis 1990   "from eating at restaurant"  . History of antiphospholipid antibody syndrome   . 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  . Hx-TIA (transient ischemic attack)   . Hyperlipidemia   . Hypertension   . Hypothyroidism   . Neuromuscular disorder ([place])   . Pneumonia    hx of  .  Stroke ([place])   . 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 Chronic anticoagulation 08/04/2015  . Pain in limb 12/26/2013  . Chest pain 11/27/2013  . GERD - post failed open Nissen 11/23/2012  . Dysphagia, unspecified(787.20) 09/28/2012  . S/P Open Nissen 1998 Dr. Mendel Ryder 08/18/2012  . 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 Antiphospholipid antibody syndrome ([place]) 06/01/2011  . Lupus anticoagulant positive 06/01/2011  . Chest pain   . Hx-TIA (transient ischemic attack)   . Hypothyroidism   . Breast cancer ([place])   . History of DVT (deep vein thrombosis)   . History of antiphospholipid antibody syndrome     Past Surgical History:  Procedure Laterality Date  . ABDOMINAL HYSTERECTOMY    . BREAST SURGERY Right    x3  . BUNIONECTOMY Bilateral 30 years ago  . CARDIAC CATHETERIZATION  07/12/1997   NORMAL LEFT VENTRICULAR FUNCTION WITH EF AT LEAST 70-75%  . ESOPHAGEAL MANOMETRY N/A 11/06/2012   Procedure: ESOPHAGEAL MANOMETRY (EM);  Surgeon: Pedro Earls, MD;  Location: [facility];  Service: General;  Laterality: N/A;  . ESOPHAGOGASTRODUODENOSCOPY ENDOSCOPY    . KNEE ARTHROSCOPY Right   . LAPAROSCOPIC NISSEN  FUNDOPLICATION N/A [id number]   Procedure: LAPAROSCOPIC TAKEDOWN  PERI-HIATAL HERNIA    REPAIR;  Surgeon: Pedro Earls, MD;  Location: [facility];  Service: General;  Laterality: N/A;  . [place]  . RIGHT OOPHORECTOMY  1980  . THYROIDECTOMY  in 20's  . TONSILLECTOMY  70 years old  . UPPER GI ENDOSCOPY N/A 12/12/2012   Procedure: UPPER GI ENDOSCOPY;  Surgeon: Pedro Earls, MD;  Location: [facility];  Service: General;  Laterality: N/A;    OB History    No data available       Home Medications    Prior to Admission medications   Medication Sig Start Date End Date Taking? Authorizing Provider    ALPRAZolam [name]) 0.5 MG tablet Take 0.5 mg by mouth 2 (two) times daily. 06/07/12   Historical Provider, MD  aspirin 81 MG tablet Take 81 mg by mouth daily.     Historical Provider, MD  buPROPion (WELLBUTRIN SR) 200 MG 12 hr tablet Take 200 mg by mouth 2 (two) times daily.     Historical Provider, MD  Calcium Carbonate-Vitamin D (CALCIUM + D PO) Take 600 mg by mouth daily.     Historical Provider, MD  fluticasone (FLONASE) 50 MCG/ACT nasal spray Place 2 sprays into the nose daily as needed for allergies.  10/16/14   Historical Provider, MD  HYDROcodone-acetaminophen (NORCO/VICODIN) 5-325 MG per tablet Take 1 tablet by mouth every 6 (six) hours as needed for pain.     Historical Provider, MD  levothyroxine (SYNTHROID, LEVOTHROID) 112 MCG tablet Take 112 mcg by mouth daily before breakfast.    Historical Provider, MD  meclizine (ANTIVERT) 25 MG tablet Take 1 tablet (25 mg total) by mouth 3 (three) times daily as needed for dizziness. 02/17/16   Charlesetta Shanks, MD  metoprolol succinate (TOPROL-XL) 25 MG 24 hr tablet Take 12.5 mg by mouth 2 (two) times daily.     Historical Provider, MD  omeprazole (PRILOSEC) 20 MG capsule Take 20 mg by mouth daily.  02/06/13   Historical Provider, MD  pravastatin (PRAVACHOL) 20 MG tablet Take 20 mg by mouth daily.    Christain Sacramento, MD  pseudoephedrine (SUDAFED) 120 MG 12 hr tablet Take 120 mg by mouth daily as needed for congestion.     Historical Provider, MD  rOPINIRole (REQUIP) 1 MG tablet Take 1 mg by mouth 2 (two) times daily.     Historical Provider, MD  warfarin (COUMADIN) 6 MG tablet Take 1/2 tablet on Tuesdays and Fridays; all other days--take 1 tablet. Patient taking differently: Take 6 mg by mouth every evening.  02/09/16   Annia Belt, MD    Family History Family History  Problem Relation Age of Onset  . Alzheimer's disease Mother   . Heart disease Mother   . Hyperlipidemia Mother   . Hypertension Mother   . Cancer Maternal Grandmother      colon  . Heart disease Sister   . Hypertension Sister   . Heart attack Sister   . Heart disease Brother   . Heart attack Brother   . Cancer Daughter   . Hypertension Daughter   . Hypertension Son     Social History Social History  Substance Use Topics  . Smoking status: Never Smoker  . Smokeless tobacco: Never Used  . Alcohol use No     Allergies   Bee venom; Hornet venom; Reglan [metoclopramide]; Vitamin k; Vitamin k and related; Benadryl [diphenhydramine hcl]; Crestor [rosuvastatin calcium]; Doxycycline;  Levofloxacin; Zocor [simvastatin]; Ciprofloxacin; Daypro [oxaprozin]; Levofloxacin; Phytonadione; and Sulfonamide derivatives   Review of Systems Review of Systems Ten systems reviewed and are negative for acute change, except as noted in the HPI.    Physical Exam Updated Vital Signs BP 120/80 (BP Location: Left Arm)   Pulse 78   Temp 98.1 F (36.7 C) (Oral)   Resp 20   SpO2 100%   Physical Exam  Constitutional: She is oriented to person, place, and time. She appears well-developed and well-nourished. No distress.  HENT:  Head: Normocephalic and atraumatic.  Eyes: Conjunctivae are normal. No scleral icterus.  Neck: Normal range of motion.  Cardiovascular: Normal rate, regular rhythm and normal heart sounds.  Exam reveals no gallop and no friction rub.   No murmur heard. Pulmonary/Chest: Effort normal and breath sounds normal. No respiratory distress.  Abdominal: Soft. Bowel sounds are normal. She exhibits no distension and no mass. There is no tenderness. There is no guarding.  Neurological: She is alert and oriented to person, place, and time.  Skin: Skin is warm and dry. She is not diaphoretic.  Nursing note and vitals reviewed.    ED Treatments / Results  Labs (all labs ordered are listed, but only abnormal results are displayed) Labs Reviewed  BASIC METABOLIC PANEL - Abnormal; Notable for the following:       Result Value   BUN 23 (*)    Creatinine,  Ser 1.72 (*)    GFR calc non Af Amer 29 (*)    GFR calc Af Amer 34 (*)    All other components within normal limits  PROTIME-INR - Abnormal; Notable for the following:    Prothrombin Time 22.5 (*)    All other components within normal limits  CBC WITH DIFFERENTIAL/PLATELET    EKG  EKG Interpretation None       Radiology Dg Chest 2 View  Result Date: 02/20/2016 CLINICAL DATA:  Hemoptysis. EXAM: CHEST  2 VIEW COMPARISON:  01/07/2015 FINDINGS: Surgical clips in the right breast. Heart and mediastinal contours are within normal limits. No focal opacities or effusions. No acute bony abnormality. IMPRESSION: No active cardiopulmonary disease. Electronically Signed   By: Rolm Baptise M.D.   On: 02/20/2016 18:02   Procedures Procedures (including critical care time)  Medications Ordered in ED Medications - No data to display   Initial Impression / Assessment and Plan / ED Course  I have reviewed the triage vital signs and the nursing notes.  Pertinent labs & imaging results that were available during my care of the patient were reviewed by me and considered in my medical decision making (see chart for details).  Clinical Course  Value Comment By Time  DG Chest 2 View (Reviewed) Margarita Mail, PA-C 07/28 1924  DG Chest 2 View (Reviewed) Margarita Mail, PA-C 07/28 1925    The patient with PT-INR within range, her hemoglobin is stable. She continues to have stable but increasing serum creatinine level. I have advised her to follow up with her primary care physician in regard to that closely. I discussed the plan of care. If there was a negative workup with Dr. [name] tonight included trying antibiotics for pansinusitis, which is likely the source of the bloody mucus. Patient appears safe for discharge at this time with close follow-up. Discussed return precautions  Final Clinical Impressions(s) / ED Diagnoses   Final diagnoses:  Blood in sputum  Elevated serum creatinine    Chronic anticoagulation    New Prescriptions New Prescriptions  No medications on file     Margarita Mail, PA-C 02/20/16 2007    Tanna Furry, MD 03/03/16 [phone]

## 2016-02-20 NOTE — ED Triage Notes (Addendum)
Pt c/o "spitting up blood" starting this morning.  Sts a mixture of bright and dark red blood.  Denies pain.  Pt was seen at [facility] x 3 days ago c/o dizziness.  Sts feeling better since taking Antivert.  Hx of lupus and blood thinners x 20 years.  Sts her INR "too low" x 3 days ago.  Further, sts she was told to continue taking normal dose.

## 2016-02-20 NOTE — Discharge Instructions (Signed)
Please follow up as soon as possible with Dr. Beryle Beams and with your primary care doctor about your primary care doctor.

## 2016-02-23 ENCOUNTER — Ambulatory Visit (INDEPENDENT_AMBULATORY_CARE_PROVIDER_SITE_OTHER): Payer: Medicare HMO | Admitting: Pharmacist

## 2016-02-23 DIAGNOSIS — Z7901 Long term (current) use of anticoagulants: Secondary | ICD-10-CM

## 2016-02-23 DIAGNOSIS — R790 Abnormal level of blood mineral: Secondary | ICD-10-CM | POA: Diagnosis not present

## 2016-02-23 DIAGNOSIS — D6861 Antiphospholipid syndrome: Secondary | ICD-10-CM | POA: Diagnosis not present

## 2016-02-23 DIAGNOSIS — R760 Raised antibody titer: Secondary | ICD-10-CM

## 2016-02-23 LAB — POCT INR: INR: 3.6

## 2016-02-23 MED ORDER — WARFARIN SODIUM 5 MG PO TABS: 5.0000 mg | ORAL_TABLET | Freq: Every day | ORAL | 1 refills | Status: DC

## 2016-02-23 NOTE — Progress Notes (Signed)
Reviewed & discussed with pharmacist Thanks DrG

## 2016-02-23 NOTE — Progress Notes (Signed)
Anticoagulation Management [name] is a 70 y.o. female who reports to the clinic for monitoring of warfarin treatment.    Indication: antiphospholipid antibody syndrome and lupus anticoagulant positive Duration: indefinite  Anticoagulation Clinic Visit History: Patient does not report signs/symptoms of bleeding or thromboembolism or any other changes  Anticoagulation Episode Summary    Current INR goal:   2.0-3.0  TTR:   69.4 % (4.7 y)  Next INR check:   03/01/2016  INR from last check:   3.6! (02/23/2016)  Weekly max dose:     Target end date:     INR check location:     Preferred lab:     Send INR reminders to:   [facility]   Indications   Antiphospholipid antibody syndrome ([place]) [D68.61] Lupus anticoagulant positive [R79.0]       Comments:         Anticoagulation Care Providers    Provider Role Specialty Phone number   Annia Belt, MD Referring Oncology [phone]     ASSESSMENT Recent Results: The most recent result is correlated with 36 mg per week: Lab Results  Component Value Date   INR 3.6 02/23/2016   INR 1.94 02/20/2016   INR 1.83 (H) 02/17/2016   PROTIME 30.0 (H) 03/13/2015    Anticoagulation Dosing: INR as of 02/23/2016 and Previous Dosing Information    INR Dt INR Goal Wkly Tot Sun Mon Tue Wed Thu Fri Sat   02/23/2016 3.6 2.0-3.0 36 mg 6 mg 6 mg 3 mg 6 mg 6 mg 3 mg 6 mg    Previous description   OMIT dose on Monday 17-JUL-17.   Anticoagulation Dose Instructions as of 02/23/2016      Total Sun Mon Tue Wed Thu Fri Sat   New Dose 35 mg 5 mg 5 mg 5 mg 5 mg 5 mg 5 mg 5 mg     (5 mg x 1)  (5 mg x 1)  (5 mg x 1)  (5 mg x 1)  (5 mg x 1)  (5 mg x 1)  (5 mg x 1)                           INR today: Supratherapeutic  PLAN Weekly dose was decreased to 35 mg/week, new prescription for 5 mg tablets sent to pharmacy  Patient Instructions  Patient educated about medication as defined in this encounter and verbalized  understanding by repeating back instructions provided.    Patient advised to contact clinic or seek medical attention if signs/symptoms of bleeding or thromboembolism occur.  Patient verbalized understanding by repeating back information and was advised to contact me if further medication-related questions arise. Patient was also provided an information handout.  Follow-up Return in about 1 week (around 03/01/2016) for Follow up INR 03/01/2016 around 1:45pm.  Kim,Jennifer J  15 minutes spent face-to-face with the patient during the encounter. 50% of time spent on education. 50% of time was spent on assessment and plan.

## 2016-02-23 NOTE — Patient Instructions (Signed)
Patient educated about medication as defined in this encounter and verbalized understanding by repeating back instructions provided.   

## 2016-03-01 ENCOUNTER — Ambulatory Visit (INDEPENDENT_AMBULATORY_CARE_PROVIDER_SITE_OTHER): Payer: Medicare HMO | Admitting: Pharmacist

## 2016-03-01 DIAGNOSIS — R760 Raised antibody titer: Secondary | ICD-10-CM

## 2016-03-01 DIAGNOSIS — Z7901 Long term (current) use of anticoagulants: Secondary | ICD-10-CM

## 2016-03-01 DIAGNOSIS — R790 Abnormal level of blood mineral: Secondary | ICD-10-CM | POA: Diagnosis not present

## 2016-03-01 DIAGNOSIS — D6861 Antiphospholipid syndrome: Secondary | ICD-10-CM | POA: Diagnosis not present

## 2016-03-01 LAB — POCT INR: INR: 3.4

## 2016-03-01 NOTE — Progress Notes (Signed)
Reviewed Thanks DrG 

## 2016-03-01 NOTE — Patient Instructions (Signed)
Patient educated about medication as defined in this encounter and verbalized understanding by repeating back instructions provided.   

## 2016-03-01 NOTE — Progress Notes (Signed)
Anticoagulation Management [name] is a 70 y.o. female who reports to the clinic for monitoring of warfarin treatment.    Indication: Antiphospholipid antibody syndrome, Lupus anticoagulant positive Duration: indefinite  Anticoagulation Clinic Visit History: Patient does not report signs/symptoms of bleeding or thromboembolism. Patient completed course of azithromycin 02/26/16.  Anticoagulation Episode Summary    Current INR goal:   2.0-3.0  TTR:   69.1 % (4.7 y)  Next INR check:   03/15/2016  INR from last check:   3.4! (03/01/2016)  Weekly max dose:     Target end date:     INR check location:     Preferred lab:     Send INR reminders to:   [facility]   Indications   Antiphospholipid antibody syndrome ([place]) [D68.61] Lupus anticoagulant positive [R79.0]       Comments:         Anticoagulation Care Providers    Provider Role Specialty Phone number   Annia Belt, MD Referring Oncology [phone]     ASSESSMENT Recent Results: The most recent result is correlated with 35 mg per week: Lab Results  Component Value Date   INR 3.4 03/01/2016   INR 3.6 02/23/2016   INR 1.94 02/20/2016   PROTIME 30.0 (H) 03/13/2015   Anticoagulation Dosing: INR as of 03/01/2016 and Previous Dosing Information    INR Dt INR Goal [employer] Sun Mon Tue Wed Thu Fri Sat   03/01/2016 3.4 2.0-3.0 35 mg 5 mg 5 mg 5 mg 5 mg 5 mg 5 mg 5 mg    Anticoagulation Dose Instructions as of 03/01/2016      Total Sun Mon Tue Wed Thu Fri Sat   New Dose 35 mg 5 mg 5 mg 5 mg 5 mg 5 mg 5 mg 5 mg     (5 mg x 1)  (5 mg x 1)  (5 mg x 1)  (5 mg x 1)  (5 mg x 1)  (5 mg x 1)  (5 mg x 1)                           INR today: Supratherapeutic  PLAN Weekly dose was decreased by 14% to 30 mg per week  Patient Instructions  Patient educated about medication as defined in this encounter and verbalized understanding by repeating back instructions provided.   Patient advised to contact clinic  or seek medical attention if signs/symptoms of bleeding or thromboembolism occur.  Patient verbalized understanding by repeating back information and was advised to contact me if further medication-related questions arise. Patient was also provided an information handout.  Follow-up Return in about 2 weeks (around 03/15/2016).  Jaylenn Baiza J  15 minutes spent face-to-face with the patient during the encounter. 50% of time spent on education. 50% of time was spent on assessment and plan.

## 2016-03-10 DIAGNOSIS — I951 Orthostatic hypotension: Secondary | ICD-10-CM | POA: Insufficient documentation

## 2016-03-10 DIAGNOSIS — R42 Dizziness and giddiness: Secondary | ICD-10-CM | POA: Insufficient documentation

## 2016-03-11 ENCOUNTER — Telehealth: Payer: Self-pay | Admitting: Pharmacist

## 2016-03-11 NOTE — Telephone Encounter (Signed)
Patient called to notify team she is starting therapy with amoxicillin-clavulanate x 1 month for sinus infection. Advised patient we will monitor effects on warfarin/INR at her upcoming appointment on 03/15/16. Patient verbalized understanding.

## 2016-03-15 ENCOUNTER — Ambulatory Visit (INDEPENDENT_AMBULATORY_CARE_PROVIDER_SITE_OTHER): Payer: Medicare HMO | Admitting: Pharmacist

## 2016-03-15 DIAGNOSIS — R790 Abnormal level of blood mineral: Secondary | ICD-10-CM | POA: Diagnosis not present

## 2016-03-15 DIAGNOSIS — Z7901 Long term (current) use of anticoagulants: Secondary | ICD-10-CM

## 2016-03-15 DIAGNOSIS — R760 Raised antibody titer: Secondary | ICD-10-CM

## 2016-03-15 DIAGNOSIS — D6861 Antiphospholipid syndrome: Secondary | ICD-10-CM

## 2016-03-15 LAB — POCT INR: INR: 2

## 2016-03-15 NOTE — Progress Notes (Signed)
Anti-Coagulation Progress Note  [name] is a 70 y.o. female who is currently on an anti-coagulation regimen.    RECENT RESULTS: Recent results are below, the most recent result is correlated with a dose of 30 mg. per week: Lab Results  Component Value Date   INR 2.00 03/15/2016   INR 3.4 03/01/2016   INR 3.6 02/23/2016   PROTIME 30.0 (H) 03/13/2015    ANTI-COAG DOSE: Anticoagulation Dose Instructions as of 03/15/2016      [name] Tue Wed Thu Fri Sat   New Dose 5 mg 2.5 mg 5 mg 5 mg 2.5 mg 5 mg 5 mg       ANTICOAG SUMMARY: Anticoagulation Episode Summary    Current INR goal:   2.0-3.0  TTR:   69.1 % (4.7 y)  Next INR check:   03/22/2016  INR from last check:   2.00 (03/15/2016)  Weekly max dose:     Target end date:     INR check location:     Preferred lab:     Send INR reminders to:   [facility]   Indications   Antiphospholipid antibody syndrome ([place]) [D68.61] Lupus anticoagulant positive [R79.0]       Comments:         Anticoagulation Care Providers    Provider Role Specialty Phone number   Annia Belt, MD Referring Oncology [phone]      ANTICOAG TODAY: Anticoagulation Summary  As of 03/15/2016   INR goal:   2.0-3.0  TTR:     Today's INR:   2.00  Next INR check:   03/22/2016  Target end date:      Indications   Antiphospholipid antibody syndrome ([place]) [D68.61] Lupus anticoagulant positive [R79.0]        Anticoagulation Episode Summary    INR check location:      Preferred lab:      Send INR reminders to:   [facility]   Comments:       Anticoagulation Care Providers    Provider Role Specialty Phone number   Annia Belt, MD Referring Oncology [phone]      PATIENT INSTRUCTIONS: There are no Patient Instructions on file for this visit.   FOLLOW-UP Return in about 7 days (around 03/22/2016) for Follow up INR at 1115h.  [name], III Pharm.D., CACP

## 2016-03-15 NOTE — Patient Instructions (Signed)
Patient instructed to take medications as defined in the Anti-coagulation Track section of this encounter.  Patient instructed to take today's dose.  Patient verbalized understanding of these instructions.    

## 2016-03-22 ENCOUNTER — Ambulatory Visit: Payer: Medicare HMO

## 2016-03-22 ENCOUNTER — Ambulatory Visit (INDEPENDENT_AMBULATORY_CARE_PROVIDER_SITE_OTHER): Payer: Medicare HMO | Admitting: Pharmacist

## 2016-03-22 DIAGNOSIS — D6861 Antiphospholipid syndrome: Secondary | ICD-10-CM | POA: Diagnosis not present

## 2016-03-22 DIAGNOSIS — Z7901 Long term (current) use of anticoagulants: Secondary | ICD-10-CM

## 2016-03-22 DIAGNOSIS — R760 Raised antibody titer: Secondary | ICD-10-CM

## 2016-03-22 DIAGNOSIS — R790 Abnormal level of blood mineral: Secondary | ICD-10-CM

## 2016-03-22 LAB — POCT INR: INR: 2.2

## 2016-03-22 NOTE — Progress Notes (Signed)
Anti-Coagulation Progress Note  [name] is a 70 y.o. female who is currently on an anti-coagulation regimen.    RECENT RESULTS: Recent results are below, the most recent result is correlated with a dose of 30 mg. per week: Lab Results  Component Value Date   INR 2.20 03/22/2016   INR 2.00 03/15/2016   INR 3.4 03/01/2016   PROTIME 30.0 (H) 03/13/2015    ANTI-COAG DOSE: Anticoagulation Dose Instructions as of 03/22/2016      [name] Tue Wed Thu Fri Sat   New Dose 5 mg 5 mg 5 mg 5 mg 2.5 mg 5 mg 5 mg       ANTICOAG SUMMARY: Anticoagulation Episode Summary    Current INR goal:   2.0-3.0  TTR:   69.2 % (4.8 y)  Next INR check:   04/12/2016  INR from last check:   2.20 (03/22/2016)  Weekly max dose:     Target end date:     INR check location:     Preferred lab:     Send INR reminders to:   [facility]   Indications   Antiphospholipid antibody syndrome ([place]) [D68.61] Lupus anticoagulant positive [R79.0]       Comments:         Anticoagulation Care Providers    Provider Role Specialty Phone number   Annia Belt, MD Referring Oncology [phone]      ANTICOAG TODAY: Anticoagulation Summary  As of 03/22/2016   INR goal:   2.0-3.0  TTR:     Today's INR:   2.20  Next INR check:   04/12/2016  Target end date:      Indications   Antiphospholipid antibody syndrome ([place]) [D68.61] Lupus anticoagulant positive [R79.0]        Anticoagulation Episode Summary    INR check location:      Preferred lab:      Send INR reminders to:   [facility]   Comments:       Anticoagulation Care Providers    Provider Role Specialty Phone number   Annia Belt, MD Referring Oncology [phone]      PATIENT INSTRUCTIONS: There are no Patient Instructions on file for this visit.   FOLLOW-UP Return in 3 weeks (on 04/12/2016) for Follow up INR at 1115h.  [name], III Pharm.D., CACP

## 2016-03-22 NOTE — Patient Instructions (Signed)
Patient instructed to take medications as defined in the Anti-coagulation Track section of this encounter.  Patient instructed to take today's dose.  Patient verbalized understanding of these instructions.    

## 2016-03-22 NOTE — Progress Notes (Signed)
Reviewed Thanks DrG 

## 2016-03-24 LAB — PROTIME-INR

## 2016-04-12 ENCOUNTER — Ambulatory Visit: Payer: Medicare HMO

## 2016-04-13 ENCOUNTER — Emergency Department (HOSPITAL_COMMUNITY): Payer: Medicare HMO

## 2016-04-13 ENCOUNTER — Encounter (HOSPITAL_COMMUNITY): Payer: Self-pay | Admitting: Emergency Medicine

## 2016-04-13 ENCOUNTER — Ambulatory Visit (INDEPENDENT_AMBULATORY_CARE_PROVIDER_SITE_OTHER): Payer: Medicare HMO | Admitting: Pharmacist

## 2016-04-13 ENCOUNTER — Inpatient Hospital Stay (HOSPITAL_COMMUNITY)
Admission: EM | Admit: 2016-04-13 | Discharge: 2016-04-20 | DRG: 098 | Disposition: A | Discharge status: Home / Self Care | Payer: Medicare HMO | Attending: Family Medicine | Admitting: Family Medicine

## 2016-04-13 DIAGNOSIS — F419 Anxiety disorder, unspecified: Secondary | ICD-10-CM | POA: Diagnosis present

## 2016-04-13 DIAGNOSIS — G459 Transient cerebral ischemic attack, unspecified: Secondary | ICD-10-CM | POA: Diagnosis not present

## 2016-04-13 DIAGNOSIS — F329 Major depressive disorder, single episode, unspecified: Secondary | ICD-10-CM | POA: Diagnosis present

## 2016-04-13 DIAGNOSIS — R269 Unspecified abnormalities of gait and mobility: Secondary | ICD-10-CM

## 2016-04-13 DIAGNOSIS — R760 Raised antibody titer: Secondary | ICD-10-CM

## 2016-04-13 DIAGNOSIS — Z79899 Other long term (current) drug therapy: Secondary | ICD-10-CM

## 2016-04-13 DIAGNOSIS — E890 Postprocedural hypothyroidism: Secondary | ICD-10-CM | POA: Diagnosis present

## 2016-04-13 DIAGNOSIS — R200 Anesthesia of skin: Secondary | ICD-10-CM

## 2016-04-13 DIAGNOSIS — H4921 Sixth [abducent] nerve palsy, right eye: Secondary | ICD-10-CM | POA: Diagnosis present

## 2016-04-13 DIAGNOSIS — D6861 Antiphospholipid syndrome: Secondary | ICD-10-CM | POA: Diagnosis not present

## 2016-04-13 DIAGNOSIS — G039 Meningitis, unspecified: Principal | ICD-10-CM | POA: Diagnosis present

## 2016-04-13 DIAGNOSIS — K219 Gastro-esophageal reflux disease without esophagitis: Secondary | ICD-10-CM | POA: Diagnosis present

## 2016-04-13 DIAGNOSIS — R9089 Other abnormal findings on diagnostic imaging of central nervous system: Secondary | ICD-10-CM

## 2016-04-13 DIAGNOSIS — H539 Unspecified visual disturbance: Secondary | ICD-10-CM

## 2016-04-13 DIAGNOSIS — M79605 Pain in left leg: Secondary | ICD-10-CM

## 2016-04-13 DIAGNOSIS — I10 Essential (primary) hypertension: Secondary | ICD-10-CM | POA: Diagnosis present

## 2016-04-13 DIAGNOSIS — Z86718 Personal history of other venous thrombosis and embolism: Secondary | ICD-10-CM

## 2016-04-13 DIAGNOSIS — R790 Abnormal level of blood mineral: Secondary | ICD-10-CM | POA: Diagnosis not present

## 2016-04-13 DIAGNOSIS — Z8673 Personal history of transient ischemic attack (TIA), and cerebral infarction without residual deficits: Secondary | ICD-10-CM

## 2016-04-13 DIAGNOSIS — Z7901 Long term (current) use of anticoagulants: Secondary | ICD-10-CM

## 2016-04-13 DIAGNOSIS — Z853 Personal history of malignant neoplasm of breast: Secondary | ICD-10-CM

## 2016-04-13 DIAGNOSIS — G030 Nonpyogenic meningitis: Secondary | ICD-10-CM

## 2016-04-13 DIAGNOSIS — R930 Abnormal findings on diagnostic imaging of skull and head, not elsewhere classified: Secondary | ICD-10-CM

## 2016-04-13 DIAGNOSIS — R208 Other disturbances of skin sensation: Secondary | ICD-10-CM | POA: Diagnosis not present

## 2016-04-13 DIAGNOSIS — Z7982 Long term (current) use of aspirin: Secondary | ICD-10-CM

## 2016-04-13 DIAGNOSIS — M797 Fibromyalgia: Secondary | ICD-10-CM | POA: Diagnosis present

## 2016-04-13 DIAGNOSIS — M1990 Unspecified osteoarthritis, unspecified site: Secondary | ICD-10-CM | POA: Diagnosis present

## 2016-04-13 DIAGNOSIS — E785 Hyperlipidemia, unspecified: Secondary | ICD-10-CM | POA: Diagnosis present

## 2016-04-13 LAB — CBC WITH DIFFERENTIAL/PLATELET
Basophils Absolute: 0.1 10*3/uL (ref 0.0–0.1)
Basophils Relative: 1 %
Eosinophils Absolute: 0.2 10*3/uL (ref 0.0–0.7)
Eosinophils Relative: 2 %
HCT: 43.5 % (ref 36.0–46.0)
Hemoglobin: 14.1 g/dL (ref 12.0–15.0)
Lymphocytes Relative: 27 %
Lymphs Abs: 2.5 10*3/uL (ref 0.7–4.0)
MCH: 31.5 pg (ref 26.0–34.0)
MCHC: 32.4 g/dL (ref 30.0–36.0)
MCV: 97.3 fL (ref 78.0–100.0)
Monocytes Absolute: 0.4 10*3/uL (ref 0.1–1.0)
Monocytes Relative: 4 %
Neutro Abs: 6.2 10*3/uL (ref 1.7–7.7)
Neutrophils Relative %: 66 %
Platelets: 265 10*3/uL (ref 150–400)
RBC: 4.47 MIL/uL (ref 3.87–5.11)
RDW: 13.9 % (ref 11.5–15.5)
WBC: 9.3 10*3/uL (ref 4.0–10.5)

## 2016-04-13 LAB — COMPREHENSIVE METABOLIC PANEL
ALT: 15 U/L (ref 14–54)
AST: 20 U/L (ref 15–41)
Albumin: 4.2 g/dL (ref 3.5–5.0)
Alkaline Phosphatase: 99 U/L (ref 38–126)
Anion gap: 8 (ref 5–15)
BUN: 17 mg/dL (ref 6–20)
CO2: 28 mmol/L (ref 22–32)
Calcium: 9.7 mg/dL (ref 8.9–10.3)
Chloride: 102 mmol/L (ref 101–111)
Creatinine, Ser: 1.42 mg/dL — ABNORMAL HIGH (ref 0.44–1.00)
GFR calc Af Amer: 43 mL/min — ABNORMAL LOW (ref >60)
GFR calc non Af Amer: 37 mL/min — ABNORMAL LOW (ref >60)
Glucose, Bld: 147 mg/dL — ABNORMAL HIGH (ref 65–99)
Potassium: 4.2 mmol/L (ref 3.5–5.1)
Sodium: 138 mmol/L (ref 135–145)
Total Bilirubin: 0.8 mg/dL (ref 0.3–1.2)
Total Protein: 7.3 g/dL (ref 6.5–8.1)

## 2016-04-13 LAB — PROTIME-INR
INR: 1.39
Prothrombin Time: 17.2 seconds — ABNORMAL HIGH (ref 11.4–15.2)

## 2016-04-13 LAB — APTT: aPTT: 32 seconds (ref 24–36)

## 2016-04-13 LAB — POCT INR: INR: 1.6

## 2016-04-13 MED ORDER — ASPIRIN EC 81 MG PO TBEC
81.0000 mg | DELAYED_RELEASE_TABLET | Freq: Every day | ORAL | Status: DC
  Administered 2016-04-14 – 2016-04-20 (×7): 81 mg via ORAL
  Filled 2016-04-13 (×7): qty 1

## 2016-04-13 MED ORDER — MAGNESIUM OXIDE 400 (241.3 MG) MG PO TABS
400.0000 mg | ORAL_TABLET | Freq: Every day | ORAL | Status: DC
  Administered 2016-04-14 – 2016-04-20 (×7): 400 mg via ORAL
  Filled 2016-04-13 (×7): qty 1

## 2016-04-13 MED ORDER — LEVOTHYROXINE SODIUM 112 MCG PO TABS
112.0000 ug | ORAL_TABLET | Freq: Every day | ORAL | Status: DC
  Administered 2016-04-14 – 2016-04-15 (×2): 112 ug via ORAL
  Filled 2016-04-13 (×2): qty 1

## 2016-04-13 MED ORDER — PANTOPRAZOLE SODIUM 40 MG PO TBEC
40.0000 mg | DELAYED_RELEASE_TABLET | Freq: Every day | ORAL | Status: DC
  Administered 2016-04-14 – 2016-04-20 (×8): 40 mg via ORAL
  Filled 2016-04-13 (×8): qty 1

## 2016-04-13 MED ORDER — ALPRAZOLAM 0.5 MG PO TABS
0.5000 mg | ORAL_TABLET | Freq: Two times a day (BID) | ORAL | Status: DC | PRN
  Administered 2016-04-14 – 2016-04-19 (×7): 0.5 mg via ORAL
  Filled 2016-04-13 (×7): qty 1

## 2016-04-13 MED ORDER — MECLIZINE HCL 25 MG PO TABS: 25.0000 mg | ORAL_TABLET | Freq: Three times a day (TID) | ORAL | Status: DC | PRN

## 2016-04-13 MED ORDER — HEPARIN (PORCINE) IN NACL 100-0.45 UNIT/ML-% IJ SOLN
1100.0000 [IU]/h | INTRAMUSCULAR | Status: DC
  Administered 2016-04-14 (×2): 1100 [IU]/h via INTRAVENOUS
  Filled 2016-04-13 (×2): qty 250

## 2016-04-13 MED ORDER — ROPINIROLE HCL 1 MG PO TABS
1.0000 mg | ORAL_TABLET | Freq: Two times a day (BID) | ORAL | Status: DC
  Administered 2016-04-14 – 2016-04-20 (×14): 1 mg via ORAL
  Filled 2016-04-13 (×14): qty 1

## 2016-04-13 MED ORDER — HEPARIN BOLUS VIA INFUSION
3000.0000 [IU] | Freq: Once | INTRAVENOUS | Status: AC
  Administered 2016-04-14: 3000 [IU] via INTRAVENOUS
  Filled 2016-04-13: qty 3000

## 2016-04-13 MED ORDER — PRAVASTATIN SODIUM 20 MG PO TABS
20.0000 mg | ORAL_TABLET | Freq: Every day | ORAL | Status: DC
  Administered 2016-04-14 – 2016-04-20 (×8): 20 mg via ORAL
  Filled 2016-04-13 (×8): qty 1

## 2016-04-13 MED ORDER — BUPROPION HCL ER (SR) 100 MG PO TB12
200.0000 mg | ORAL_TABLET | Freq: Two times a day (BID) | ORAL | Status: DC
  Administered 2016-04-14 – 2016-04-20 (×14): 200 mg via ORAL
  Filled 2016-04-13 (×15): qty 2

## 2016-04-13 MED ORDER — FLUTICASONE PROPIONATE 50 MCG/ACT NA SUSP: 2.0000 | Freq: Every day | NASAL | Status: DC | PRN

## 2016-04-13 MED ORDER — HYDROCODONE-ACETAMINOPHEN 5-325 MG PO TABS
1.0000 | ORAL_TABLET | Freq: Every day | ORAL | Status: DC
  Administered 2016-04-14 – 2016-04-19 (×7): 1 via ORAL
  Filled 2016-04-13 (×7): qty 1

## 2016-04-13 MED ORDER — STROKE: EARLY STAGES OF RECOVERY BOOK
Freq: Once | Status: AC
  Administered 2016-04-14
  Filled 2016-04-13: qty 1

## 2016-04-13 MED ORDER — GADOBENATE DIMEGLUMINE 529 MG/ML IV SOLN
20.0000 mL | Freq: Once | INTRAVENOUS | Status: AC
  Administered 2016-04-13: 18 mL via INTRAVENOUS

## 2016-04-13 MED ORDER — METOPROLOL SUCCINATE ER 25 MG PO TB24
12.5000 mg | ORAL_TABLET | Freq: Two times a day (BID) | ORAL | Status: DC
  Administered 2016-04-14 – 2016-04-20 (×13): 12.5 mg via ORAL
  Filled 2016-04-13 (×14): qty 1

## 2016-04-13 NOTE — Progress Notes (Signed)
Anticoagulation Management [name] is a 69 y.o. female who reports to the clinic for monitoring of warfarin treatment.    Indication: Thrombophilia, history of VTE, TIA Duration: indefinite  Anticoagulation Clinic Visit History: Patient does report signs/symptoms of bleeding or thromboembolism as reviewed under charting. Patient correctly verbalizes warfarin dose and reports no recent medication changes. Patient was referred to emergency room for further evaluation. Anticoagulation Episode Summary    Current INR goal:   2.0-3.0  TTR:   68.8 % (4.8 y)  Next INR check:   04/20/2016  INR from last check:   1.6! (04/13/2016)  Weekly max dose:     Target end date:     INR check location:     Preferred lab:     Send INR reminders to:   [facility]   Indications   Antiphospholipid antibody syndrome ([place]) [D68.61] Lupus anticoagulant positive [R79.0]       Comments:         Anticoagulation Care Providers    Provider Role Specialty Phone number   Annia Belt, MD Referring Oncology [phone]     ASSESSMENT Recent Results: The most recent result is correlated with 32.5 mg per week: Lab Results  Component Value Date   INR 1.6 04/13/2016   INR 2.20 03/22/2016   INR 2.00 03/15/2016   PROTIME 30.0 (H) 03/13/2015   Anticoagulation Dosing: INR as of 04/13/2016 and Previous Dosing Information    INR Dt INR Goal [employer] Sun Mon Tue Wed Thu Fri Sat   04/13/2016 1.6 2.0-3.0 32.5 mg 5 mg 5 mg 5 mg 5 mg 2.5 mg 5 mg 5 mg    Anticoagulation Dose Instructions as of 04/13/2016      Total Sun Mon Tue Wed Thu Fri Sat   New Dose 35 mg 5 mg 5 mg 5 mg 5 mg 5 mg 5 mg 5 mg     (5 mg x 1)  (5 mg x 1)  (5 mg x 1)  (5 mg x 1)  (5 mg x 1)  (5 mg x 1)  (5 mg x 1)                           INR today: Subtherapeutic  PLAN Weekly dose was increased by 8% to 35 mg per week  Patient advised to contact clinic or seek medical attention if signs/symptoms of bleeding or  thromboembolism occur.  Patient verbalized understanding by repeating back information and was advised to contact me if further medication-related questions arise. Patient was also provided an information handout.  Follow-up No Follow-up on file.  Kim,Jennifer J  5 minutes spent face-to-face with the patient during the encounter. 50% of time spent on education. 50% of time was spent on assessment, plan, coordination of care.

## 2016-04-13 NOTE — Progress Notes (Signed)
ANTICOAGULATION CONSULT NOTE - Initial Consult  Pharmacy Consult for Heparin Indication: antiphospholipid AB syndrome  Allergies  Allergen Reactions  . Bee Venom Anaphylaxis  . Hornet Venom Anaphylaxis    Anaphylaxis Shock SHOCK  . Reglan [Metoclopramide] Shortness Of Breath and Other (See Comments)    Tongue got numb; didn't feel good  . Vitamin K Anaphylaxis and Rash    "kills me" iv  . Vitamin K And Related Anaphylaxis  . Benadryl [Diphenhydramine Hcl]     Rash  . Crestor [Rosuvastatin Calcium]     Muscle Aches  . Doxycycline     Tongue burning, thrush  . Levofloxacin     Rash  . Zocor [Simvastatin]     Muscle Aches  . Ciprofloxacin Rash  . Daypro [Oxaprozin] Rash    Rash  . Levofloxacin Rash  . Phytonadione Rash  . Sulfonamide Derivatives Rash    Patient Measurements: Weight: 180 lb (81.6 kg) Heparin Dosing Weight: 74  Vital Signs: Temp: 97.9 F (36.6 C) (09/19 1223) Temp Source: Oral (09/19 1223) BP: 127/86 (09/19 2115) Pulse Rate: 75 (09/19 2115)  Labs:  Recent Labs  04/13/16 1601 04/13/16 1616  HGB  --  14.1  HCT  --  43.5  PLT  --  265  APTT  --  32  LABPROT  --  17.2*  INR 1.6 1.39  CREATININE  --  1.42*    CrCl cannot be calculated (Unknown ideal weight.).   Medical History: Past Medical History:  Diagnosis Date  . Anxiety    severe panic attacks  . Arthritis   . Asthma    as child  . Breast cancer ([place])    right  . Chest pain   . Chronic anticoagulation 08/04/2015  . Collapsed lung    hx of, left  . Complication of anesthesia    "hard to put asleep"  . Depression   . Fibromyalgia   . GERD (gastroesophageal reflux disease)   . H/O hiatal hernia   . Headache(784.0)   . Hepatitis 1990   "from eating at restaurant"  . History of antiphospholipid antibody syndrome   . 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  . Hx-TIA (transient ischemic attack)   . Hyperlipidemia   . Hypertension   . Hypothyroidism   .  Neuromuscular disorder ([place])   . Pneumonia    hx of  . Stroke ([place])   . 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  She is to start heparin bridge as she may require a LP.  Her last dose of Coumadin was on 9/18 and INR is  sub-therapeutic today.  Hg and pltc are wnl.    Goal of Therapy:  Heparin level 0.3-0.7 units/ml Monitor platelets by anticoagulation protocol: Yes   Plan:  Heparin 3000 units IV x 1, then 1100 units/hr Heparin level 6hr Daily HL and CBC F/U plans for LP, then resuming Coumadin   Gracy Bruins, PharmD [facility]

## 2016-04-13 NOTE — ED Provider Notes (Signed)
[name] DEPT Provider Note   CSN: [id number] Arrival date & time: 04/13/16  1208     History   Chief Complaint Chief Complaint  Patient presents with  . Blurred Vision    HPI [name] is a 70 y.o. female.  HPI   70 year old female sent here from internal medicine for further evaluation of her blurred vision. 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 Patient states when she woke up yesterday morning, she was having difficulty with her physician from her right eye. States that whenever she turns her eyes to the right, she sees triple vision in the right eye. Furthermore she also reported having tingling and numbness sensation to the left side of her face, her left elbow down to her left hand as well as numbness sensation to left knee onto her left foot. Symptom has been persistent, and not improved. She is having difficulty walking due to her vision changes and new numbness. She denies having fever, headache, loss of vision, neck pain, chest pain, difficulty breathing, abdominal pain, back pain, bowel bladder incontinence, saddle anesthesia, or rash. She was seen by her PCP this morning for a checkup as well as to recheck INR. Her INR is subtherapeutic at 1.6. She was also recommended to come to the ER for further evaluation of her vision changes and left-sided weakness. Patient also reported having pain to her left calf for the same duration.  Past Medical History:  Diagnosis Date  . Anxiety    severe panic attacks  . Arthritis   . Asthma    as child  . Breast cancer ([place])    right  . Chest pain   . Chronic anticoagulation 08/04/2015  . Collapsed lung    hx of, left  . Complication of anesthesia    "hard to put asleep"  . Depression   . Fibromyalgia   . GERD (gastroesophageal reflux disease)   . H/O hiatal hernia   . Headache(784.0)   . Hepatitis 1990   "from  eating at restaurant"  . History of antiphospholipid antibody syndrome   . 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  . Hx-TIA (transient ischemic attack)   . Hyperlipidemia   . Hypertension   . Hypothyroidism   . Neuromuscular disorder ([place])   . Pneumonia    hx of  . Stroke ([place])   . 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 Chronic anticoagulation 08/04/2015  . Pain in limb 12/26/2013  . Chest pain 11/27/2013  . GERD - post failed open Nissen 11/23/2012  . Dysphagia, unspecified(787.20) 09/28/2012  . S/P Open Nissen 1998 Dr. Mendel Ryder 08/18/2012  . 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 Antiphospholipid antibody syndrome ([place]) 06/01/2011  . Lupus anticoagulant positive 06/01/2011  . Chest pain   . Hx-TIA (transient ischemic attack)   . Hypothyroidism   . Breast cancer ([place])   . History of DVT (deep vein thrombosis)   . History of antiphospholipid antibody syndrome     Past Surgical History:  Procedure Laterality Date  . ABDOMINAL HYSTERECTOMY    . BREAST SURGERY Right    x3  . BUNIONECTOMY Bilateral 30 years ago  . CARDIAC CATHETERIZATION  07/12/1997   NORMAL LEFT VENTRICULAR FUNCTION WITH EF AT LEAST  70-75%  . ESOPHAGEAL MANOMETRY N/A 11/06/2012   Procedure: ESOPHAGEAL MANOMETRY (EM);  Surgeon: Pedro Earls, MD;  Location: [facility];  Service: General;  Laterality: N/A;  . ESOPHAGOGASTRODUODENOSCOPY ENDOSCOPY    . KNEE ARTHROSCOPY Right   . LAPAROSCOPIC NISSEN FUNDOPLICATION N/A [id number]   Procedure: LAPAROSCOPIC TAKEDOWN  PERI-HIATAL HERNIA    REPAIR;  Surgeon: Pedro Earls, MD;  Location: [facility]S;  Service: General;  Laterality: N/A;  . [place]  . RIGHT OOPHORECTOMY  1980  . THYROIDECTOMY  in 20's  . TONSILLECTOMY  70 years old  . UPPER GI ENDOSCOPY N/A 12/12/2012    Procedure: UPPER GI ENDOSCOPY;  Surgeon: Pedro Earls, MD;  Location: [facility];  Service: General;  Laterality: N/A;    OB History    No data available       Home Medications    Prior to Admission medications   Medication Sig Start Date End Date Taking? Authorizing Provider  ALPRAZolam [name]) 0.5 MG tablet Take 0.5 mg by mouth 2 (two) times daily. 06/07/12   Historical Provider, MD  aspirin 81 MG tablet Take 81 mg by mouth daily.     Historical Provider, MD  azithromycin (ZITHROMAX Z-PAK) 250 MG tablet 2 po day one, then 1 daily x 4 days 02/20/16   Margarita Mail, PA-C  buPROPion [facility] SR) 200 MG 12 hr tablet Take 200 mg by mouth 2 (two) times daily.     Historical Provider, MD  Calcium Carbonate-Vitamin D (CALCIUM + D PO) Take 600 mg by mouth daily.     Historical Provider, MD  fluticasone (FLONASE) 50 MCG/ACT nasal spray Place 2 sprays into the nose daily as needed for allergies.  10/16/14   Historical Provider, MD  HYDROcodone-acetaminophen (NORCO/VICODIN) 5-325 MG per tablet Take 1 tablet by mouth every 6 (six) hours as needed for pain.     Historical Provider, MD  levothyroxine (SYNTHROID, LEVOTHROID) 112 MCG tablet Take 112 mcg by mouth daily before breakfast.    Historical Provider, MD  meclizine (ANTIVERT) 25 MG tablet Take 1 tablet (25 mg total) by mouth 3 (three) times daily as needed for dizziness. 02/17/16   Charlesetta Shanks, MD  metoprolol succinate (TOPROL-XL) 25 MG 24 hr tablet Take 12.5 mg by mouth 2 (two) times daily.     Historical Provider, MD  omeprazole (PRILOSEC) 20 MG capsule Take 20 mg by mouth daily.  02/06/13   Historical Provider, MD  pravastatin (PRAVACHOL) 20 MG tablet Take 20 mg by mouth daily.    Christain Sacramento, MD  pseudoephedrine (SUDAFED) 120 MG 12 hr tablet Take 120 mg by mouth daily as needed for congestion.     Historical Provider, MD  rOPINIRole (REQUIP) 1 MG tablet Take 1 mg by mouth 2 (two) times daily.     Historical Provider, MD  warfarin  (COUMADIN) 5 MG tablet Take 1 tablet (5 mg total) by mouth daily. 02/23/16   Annia Belt, MD    Family History Family History  Problem Relation Age of Onset  . Alzheimer's disease Mother   . Heart disease Mother   . Hyperlipidemia Mother   . Hypertension Mother   . Cancer Maternal Grandmother     colon  . Heart disease Sister   . Hypertension Sister   . Heart attack Sister   . Heart disease Brother   . Heart attack Brother   . Cancer Daughter   . Hypertension Daughter   . Hypertension Son  Social History Social History  Substance Use Topics  . Smoking status: Never Smoker  . Smokeless tobacco: Never Used  . Alcohol use No     Allergies   Bee venom; Hornet venom; Reglan [metoclopramide]; Vitamin k; Vitamin k and related; Benadryl [diphenhydramine hcl]; Crestor [rosuvastatin calcium]; Doxycycline; Levofloxacin; Zocor [simvastatin]; Ciprofloxacin; Daypro [oxaprozin]; Levofloxacin; Phytonadione; and Sulfonamide derivatives   Review of Systems Review of Systems  All other systems reviewed and are negative.    Physical Exam Updated Vital Signs BP 133/84   Pulse 78   Temp 97.9 F (36.6 C) (Oral)   Resp 18   SpO2 100%   Physical Exam  Constitutional: She is oriented to person, place, and time. She appears well-developed and well-nourished. No distress.  HENT:  Head: Atraumatic.  Eyes: Conjunctivae and EOM are normal. Pupils are equal, round, and reactive to light.  Neck: Normal range of motion. Neck supple.  No nuchal rigidity  Cardiovascular: Normal rate and regular rhythm.   Pulmonary/Chest: Effort normal and breath sounds normal.  Abdominal: Soft. There is no tenderness.  Neurological: She is alert and oriented to person, place, and time.  Neurologic exam:  Speech clear, pupils equal round reactive to light, extraocular movements intact  R visual field cut. Cranial nerves III through XII normal including no facial droop Follows commands, moves  all extremities x4, normal strength to bilateral upper and lower extremities at all major muscle groups including grip Sensation decreased to L side of face, L arm from elbow to hand, and L leg from knee to foot Poor finger to nose coordination, no limb ataxia,  Rapid alternating movements delay No pronator drift Gait not tested   Skin: No rash noted.  Psychiatric: She has a normal mood and affect.  Nursing note and vitals reviewed.    ED Treatments / Results  Labs (all labs ordered are listed, but only abnormal results are displayed) Labs Reviewed  COMPREHENSIVE METABOLIC PANEL - Abnormal; Notable for the following:       Result Value   Glucose, Bld 147 (*)    Creatinine, Ser 1.42 (*)    GFR calc non Af Amer 37 (*)    GFR calc Af Amer 43 (*)    All other components within normal limits  PROTIME-INR - Abnormal; Notable for the following:    Prothrombin Time 17.2 (*)    All other components within normal limits  CBC WITH DIFFERENTIAL/PLATELET  APTT  CBC WITH DIFFERENTIAL/PLATELET    EKG  EKG Interpretation None       Radiology Ct Head Wo Contrast  Result Date: 04/13/2016 CLINICAL DATA:  Pt states triple vision on right side, and numbness on left side from knee to toes and elbow to finger tips x1 day. Pt also has numbness in left side of face. EXAM: CT HEAD WITHOUT CONTRAST TECHNIQUE: Contiguous axial images were obtained from the base of the skull through the vertex without intravenous contrast. COMPARISON:  Brain MRI and head CT, 02/17/2016 FINDINGS: Brain: No evidence of acute infarction, hemorrhage, hydrocephalus, extra-axial collection or mass lesion/mass effect. Vascular: No hyperdense vessel or unexpected calcification. Skull: Normal. Negative for fracture or focal lesion. Sinuses/Orbits: Minor right maxillary sinus and left sphenoid sinus mucosal thickening. Visualized sinuses otherwise clear. Normal globes and orbits. Other: Clear mastoid air cells. IMPRESSION: 1. No  intracranial abnormality. Age related volume loss stable from the recent prior studies. 2. Sinus mucosal thickening, now only minor, improved from the recent prior exams. Electronically Signed   By:  Lajean Manes M.D.   On: 04/13/2016 16:38   Mr Angiogram Head Wo Contrast  Result Date: 04/13/2016 CLINICAL DATA:  LEFT-sided numbness, vision changes beginning yesterday. History of anti phospholipid antibody syndrome,subtherapeutic on warfarin. History of breast cancer. EXAM: MRI HEAD WITH AND WITHOUT CONTRAST MRA HEAD WITHOUT CONTRAST MRA NECK WITHOUT AND WITH CONTRAST TECHNIQUE: Multiplanar, multiecho pulse sequences of the brain and surrounding structures were obtained with and without intravenous contrast. Angiographic images of the Circle of Willis were obtained using MRA technique without intravenous contrast. Angiographic images of the neck were obtained using MRA technique without and with intravenous contrast. Carotid stenosis measurements (when applicable) are obtained utilizing NASCET criteria, using the distal internal carotid diameter as the denominator. CONTRAST:  18 cc MultiHance COMPARISON:  CT HEAD April 13, 2016 at 1625 hours and MRI and MRA of the head February 17, 2016 FINDINGS: MRI HEAD FINDINGS BRAIN: No reduced diffusion to suggest acute ischemia. No susceptibility artifact to suggest hemorrhage. The ventricles and sulci are normal for patient's age. At least 10 sub cm enhancing foci in the cerebellum, brainstem. Sub cm enhancing lesions in the RIGHT greater than LEFT basal ganglia, corona radiata and centrum semiovale. Corresponding mild T2 hyperintense signal. Patchy supratentorial white matter FLAIR T2 hyperintensities exclusive of the aforementioned abnormality, relatively unchanged. No midline shift or mass effect. No abnormal extra-axial fluid collections,abnormal extra-axial enhancement or masses. VASCULAR: Normal major intracranial vascular flow voids present at skull base. SKULL AND  UPPER CERVICAL SPINE: No abnormal sellar expansion. No suspicious calvarial bone marrow signal. Craniocervical junction maintained. SINUSES/ORBITS: The mastoid air-cells and included paranasal sinuses are well-aerated. The included ocular globes and orbital contents are non-suspicious. OTHER:  None. MRA HEAD FINDINGS ANTERIOR CIRCULATION: Normal flow related enhancement of the included cervical, petrous, cavernous and supraclinoid internal carotid arteries. Patent anterior communicating artery. Normal flow related enhancement of the anterior and middle cerebral arteries,including distal segments.No large vessel occlusion, high-grade stenosis, abnormal luminal irregularity, aneurysm. POSTERIOR CIRCULATION: RIGHT vertebral artery is dominant. LEFT vertebral artery terminates in the posterior inferior cerebellar artery. Artery is patent, with normal flow related enhancement of the main branch vessels. Robust bilateral posterior communicating arteries present. Normal flow related enhancement of the posterior cerebral arteries.No large vessel occlusion, high-grade stenosis, abnormal luminal irregularity, aneurysm. MRA NECK FINDINGS Source images and MIP image were reviewed. The common carotid arteries are widely patent bilaterally. The carotid bifurcation is patent approximate 50% stenosis RIGHT internal carotid artery origin by NASCET criteria. Mild luminal irregularity of the bilateral vertebral arteries which remain patent throughout the course. IMPRESSION: MRI HEAD: Multiple subcentimeter foci of abnormal enhancement in the supra- and infratentorial brain predominately in a perivascular distribution most consistent with leptomeningitis. Though findings are often infectious or inflammatory, given given patient's history of breast cancer, neoplasm is a consideration. Recommend correlation with cerebral spinal fluid studies. MRA HEAD: Negative ; no typical findings of vasculopathy. MRA NECK: 50% stenosis RIGHT internal  carotid artery origin by NASCET criteria.Mild vertebral artery regularity favoring atherosclerosis. Acute findings discussed with and reconfirmed by PA Kyon Bentler on 04/13/2016 at 7: 40 pm. Electronically Signed   By: Elon Alas M.D.   On: 04/13/2016 19:53   Mr Angiogram Neck W Or Wo Contrast  Result Date: 04/13/2016 CLINICAL DATA:  LEFT-sided numbness, vision changes beginning yesterday. History of anti phospholipid antibody syndrome,subtherapeutic on warfarin. History of breast cancer. EXAM: MRI HEAD WITH AND WITHOUT CONTRAST MRA HEAD WITHOUT CONTRAST MRA NECK WITHOUT AND WITH CONTRAST TECHNIQUE: Multiplanar, multiecho pulse  sequences of the brain and surrounding structures were obtained with and without intravenous contrast. Angiographic images of the Circle of Willis were obtained using MRA technique without intravenous contrast. Angiographic images of the neck were obtained using MRA technique without and with intravenous contrast. Carotid stenosis measurements (when applicable) are obtained utilizing NASCET criteria, using the distal internal carotid diameter as the denominator. CONTRAST:  18 cc MultiHance COMPARISON:  CT HEAD April 13, 2016 at 1625 hours and MRI and MRA of the head February 17, 2016 FINDINGS: MRI HEAD FINDINGS BRAIN: No reduced diffusion to suggest acute ischemia. No susceptibility artifact to suggest hemorrhage. The ventricles and sulci are normal for patient's age. At least 10 sub cm enhancing foci in the cerebellum, brainstem. Sub cm enhancing lesions in the RIGHT greater than LEFT basal ganglia, corona radiata and centrum semiovale. Corresponding mild T2 hyperintense signal. Patchy supratentorial white matter FLAIR T2 hyperintensities exclusive of the aforementioned abnormality, relatively unchanged. No midline shift or mass effect. No abnormal extra-axial fluid collections,abnormal extra-axial enhancement or masses. VASCULAR: Normal major intracranial vascular flow voids  present at skull base. SKULL AND UPPER CERVICAL SPINE: No abnormal sellar expansion. No suspicious calvarial bone marrow signal. Craniocervical junction maintained. SINUSES/ORBITS: The mastoid air-cells and included paranasal sinuses are well-aerated. The included ocular globes and orbital contents are non-suspicious. OTHER:  None. MRA HEAD FINDINGS ANTERIOR CIRCULATION: Normal flow related enhancement of the included cervical, petrous, cavernous and supraclinoid internal carotid arteries. Patent anterior communicating artery. Normal flow related enhancement of the anterior and middle cerebral arteries,including distal segments.No large vessel occlusion, high-grade stenosis, abnormal luminal irregularity, aneurysm. POSTERIOR CIRCULATION: RIGHT vertebral artery is dominant. LEFT vertebral artery terminates in the posterior inferior cerebellar artery. Artery is patent, with normal flow related enhancement of the main branch vessels. Robust bilateral posterior communicating arteries present. Normal flow related enhancement of the posterior cerebral arteries.No large vessel occlusion, high-grade stenosis, abnormal luminal irregularity, aneurysm. MRA NECK FINDINGS Source images and MIP image were reviewed. The common carotid arteries are widely patent bilaterally. The carotid bifurcation is patent approximate 50% stenosis RIGHT internal carotid artery origin by NASCET criteria. Mild luminal irregularity of the bilateral vertebral arteries which remain patent throughout the course. IMPRESSION: MRI HEAD: Multiple subcentimeter foci of abnormal enhancement in the supra- and infratentorial brain predominately in a perivascular distribution most consistent with leptomeningitis. Though findings are often infectious or inflammatory, given given patient's history of breast cancer, neoplasm is a consideration. Recommend correlation with cerebral spinal fluid studies. MRA HEAD: Negative ; no typical findings of vasculopathy. MRA  NECK: 50% stenosis RIGHT internal carotid artery origin by NASCET criteria.Mild vertebral artery regularity favoring atherosclerosis. Acute findings discussed with and reconfirmed by PA Addasyn Mcbreen on 04/13/2016 at 7: 40 pm. Electronically Signed   By: Elon Alas M.D.   On: 04/13/2016 19:53   Mr Jeri Cos [id number] Contrast  Result Date: 04/13/2016 CLINICAL DATA:  LEFT-sided numbness, vision changes beginning yesterday. History of anti phospholipid antibody syndrome, subtherapeutic on warfarin. History of breast cancer. EXAM: MRI HEAD WITH AND WITHOUT CONTRAST MRA HEAD WITHOUT CONTRAST MRA NECK WITHOUT AND WITH CONTRAST TECHNIQUE: Multiplanar, multiecho pulse sequences of the brain and surrounding structures were obtained with and without intravenous contrast. Angiographic images of the Circle of Willis were obtained using MRA technique without intravenous contrast. Angiographic images of the neck were obtained using MRA technique without and with intravenous contrast. Carotid stenosis measurements (when applicable) are obtained utilizing NASCET criteria, using the distal internal carotid diameter as the denominator.  CONTRAST:  18 cc MultiHance COMPARISON:  CT HEAD April 13, 2016 at 1625 hours and MRI and MRA of the head February 17, 2016 FINDINGS: MRI HEAD FINDINGS BRAIN: No reduced diffusion to suggest acute ischemia. No susceptibility artifact to suggest hemorrhage. The ventricles and sulci are normal for patient's age. At least 10 sub cm enhancing foci in the cerebellum, brainstem. Sub cm enhancing lesions in the RIGHT greater than LEFT basal ganglia, corona radiata and centrum semiovale. Corresponding mild T2 hyperintense signal. Patchy supratentorial white matter FLAIR T2 hyperintensities exclusive of the aforementioned abnormality, relatively unchanged. No midline shift or mass effect. No abnormal extra-axial fluid collections, abnormal extra-axial enhancement or masses. VASCULAR: Normal major intracranial  vascular flow voids present at skull base. SKULL AND UPPER CERVICAL SPINE: No abnormal sellar expansion. No suspicious calvarial bone marrow signal. Craniocervical junction maintained. SINUSES/ORBITS: The mastoid air-cells and included paranasal sinuses are well-aerated. The included ocular globes and orbital contents are non-suspicious. OTHER: None. MRA HEAD FINDINGS ANTERIOR CIRCULATION: Normal flow related enhancement of the included cervical, petrous, cavernous and supraclinoid internal carotid arteries. Patent anterior communicating artery. Normal flow related enhancement of the anterior and middle cerebral arteries, including distal segments. No large vessel occlusion, high-grade stenosis, abnormal luminal irregularity, aneurysm. POSTERIOR CIRCULATION: RIGHT vertebral artery is dominant. LEFT vertebral artery terminates in the posterior inferior cerebellar artery. Artery is patent, with normal flow related enhancement of the main branch vessels. Robust bilateral posterior communicating arteries present. Normal flow related enhancement of the posterior cerebral arteries. No large vessel occlusion, high-grade stenosis, abnormal luminal irregularity, aneurysm. MRA NECK FINDINGS Source images and MIP image were reviewed. The common carotid arteries are widely patent bilaterally. The carotid bifurcation is patent approximate 50% stenosis RIGHT internal carotid artery origin by NASCET criteria. Mild luminal irregularity of the bilateral vertebral arteries which remain patent throughout the course. IMPRESSION: MRI HEAD: Multiple subcentimeter foci of abnormal enhancement in the supra- and infratentorial brain predominately in a perivascular distribution most consistent with leptomeningitis. Though findings are often infectious or inflammatory, given given patient's history of breast cancer, neoplasm is a consideration. Recommend correlation with cerebral spinal fluid studies. MRA HEAD: Negative ; no typical findings  of vasculopathy. MRA NECK: 50% stenosis RIGHT internal carotid artery origin by NASCET criteria. Mild vertebral artery regularity favoring atherosclerosis. Acute findings discussed with and reconfirmed by PA Delon Revelo on 04/13/2016 at 7:40 pm. Electronically Signed   By: Elon Alas M.D.   On: 04/13/2016 19:44    Procedures Procedures (including critical care time)  Medications Ordered in ED Medications  gadobenate dimeglumine (MULTIHANCE) injection 20 mL (18 mLs Intravenous Contrast Given 04/13/16 1924)     Initial Impression / Assessment and Plan / ED Course  I have reviewed the triage vital signs and the nursing notes.  Pertinent labs & imaging results that were available during my care of the patient were reviewed by me and considered in my medical decision making (see chart for details).  Clinical Course    BP 124/79   Pulse 77   Temp 97.9 F (36.6 C) (Oral)   Resp 16   SpO2 99%    Final Clinical Impressions(s) / ED Diagnoses   Final diagnoses:  Left arm numbness    New Prescriptions New Prescriptions   No medications on file   4:14 PM Pt here for evaluation of vision changes, L side numbness including L side of face, L lower arm and L lower leg. Pt with binocular diplopia. Strength equal to all 4  extremities with intact DTR.  BLE without palpable cords, erythema or edema.  She does have hx of clotting problem currently on warfarin, but subtherapeutic at 1.6.  Plan to obtain brain and Cspine MRI for further evaluation of her condition.  Care discussed with DR. Schlossman.     5:43 PM Appreciate consultation from neurologist Dr. Cristobal Goldmann who agrees pt should be admitted for stroke work up.  He request for head and neck MRA for further management.    7:54 PM Radiologist called to notified MRI/MRA of head and brain shows no acute stroke but there are several enhanced lesions concerning for vasculitis.  Unable to r/o lepto meningitis or neoplasm.  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  I have consulted with [facility] Dr. Alcario Drought who request neurology to be consulted with the new result to determine further management.  Will consult neuro.    8:22 PM Appreciate consultation from neurologist Dr. Nicole Kindred who agrees to see pt in the ER and will determine disposition.    9:17 PM Dr. Nicole Kindred has evaluated pt.  He felt pt will benefit from a TIA work up as she is outside the window for stroke treatment.  He also felt pt should not have an LP done at this time since she is on warfarin. He has low suspicion for leptomeningitis. He request medicine admission.  Appreciate Dr. Alcario Drought who agrees to admit her to obs, telemetry for further care.  TIA work up.     Domenic Moras, PA-C 04/13/16 2121    Gareth Morgan, MD 04/14/16 [phone]

## 2016-04-13 NOTE — ED Notes (Signed)
Patient transported to MRI 

## 2016-04-13 NOTE — ED Notes (Signed)
Pt came in due to seeing double for two days.  She then began to see efforts of 4.  Waiting for pharmacy to verify meds.  Pt has a 20 in her left AC.  She is alert and oriented X4.  Pt reported that she is hungry, bag lunch given to take to floor.  No pain reported at this time.

## 2016-04-13 NOTE — ED Triage Notes (Signed)
Sent from internal medicine. Has antiphospholipid antibody syndrome- lupus clotting disorder. Has left sided numbness. All these symptoms started yesterday morning; she laid down and when got up, had vision changes. States symptoms worse today.

## 2016-04-13 NOTE — H&P (Signed)
History and Physical    [name] [id number] DOB: Jan 05, 1946 DOA: 04/13/2016   PCP: [name] Chief Complaint:  Chief Complaint  Patient presents with  . Blurred Vision    HPI: [name] is a 70 y.o. female with medical history significant of antiphospholipid AB syndrome, chronic anticoagulation with coumadin, TIA, HLD, HTN.  Patient presents to the ED with c/o left sided numbness and blurred vision involving R eye.  Symptoms first noticed yesterday AM, no prior symptoms like this, no facial droop, does have change in speech, INR today was 1.6.  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  No headache, no neck stiffness, no neck pain, no fever.  Nothing makes symptoms better or worse, symptoms persistent.  Review of Systems: As per HPI otherwise 10 point review of systems negative.    Past Medical History:  Diagnosis Date  . Anxiety    severe panic attacks  . Arthritis   . Asthma    as child  . Breast cancer ([place])    right  . Chest pain   . Chronic anticoagulation 08/04/2015  . Collapsed lung    hx of, left  . Complication of anesthesia    "hard to put asleep"  . Depression   . Fibromyalgia   . GERD (gastroesophageal reflux disease)   . H/O hiatal hernia   . Headache(784.0)   . Hepatitis 1990   "from eating at restaurant"  . History of antiphospholipid antibody syndrome   . 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  . Hx-TIA (transient ischemic attack)   . Hyperlipidemia   . Hypertension   . Hypothyroidism   . Neuromuscular disorder ([place])   . Pneumonia    hx of  . Stroke ([place])   . 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 ABDOMINAL HYSTERECTOMY    . BREAST SURGERY Right    x3  . BUNIONECTOMY Bilateral 30 years ago  . CARDIAC CATHETERIZATION  07/12/1997   NORMAL LEFT VENTRICULAR FUNCTION WITH EF AT LEAST 70-75%  . ESOPHAGEAL MANOMETRY N/A 11/06/2012   Procedure: ESOPHAGEAL MANOMETRY (EM);  Surgeon: Pedro Earls, MD;  Location: [facility];  Service: General;  Laterality: N/A;  . ESOPHAGOGASTRODUODENOSCOPY ENDOSCOPY    . KNEE ARTHROSCOPY Right   . LAPAROSCOPIC NISSEN FUNDOPLICATION N/A [id number]   Procedure: LAPAROSCOPIC TAKEDOWN  PERI-HIATAL HERNIA    REPAIR;  Surgeon: Pedro Earls, MD;  Location: [facility];  Service: General;  Laterality: N/A;  . [place]  . RIGHT OOPHORECTOMY  1980  . THYROIDECTOMY  in 20's  . TONSILLECTOMY  70 years old  . UPPER GI ENDOSCOPY N/A 12/12/2012   Procedure: UPPER GI ENDOSCOPY;  Surgeon: Pedro Earls, MD;  Location: [facility];  Service: General;  Laterality: N/A;     reports that she has never smoked. She has never used smokeless tobacco. She reports that she does not drink alcohol or use drugs.  Allergies  Allergen Reactions  . Bee Venom Anaphylaxis  . Hornet Venom Anaphylaxis    Anaphylaxis Shock SHOCK  . Reglan [Metoclopramide] Shortness Of Breath and Other (See Comments)    Tongue got numb; didn't feel good  .  Vitamin K Anaphylaxis and Rash    "kills me" iv  . Vitamin K And Related Anaphylaxis  . Benadryl [Diphenhydramine Hcl]     Rash  . Crestor [Rosuvastatin Calcium]     Muscle Aches  . Doxycycline     Tongue burning, thrush  . Levofloxacin     Rash  . Zocor [Simvastatin]     Muscle Aches  . Ciprofloxacin Rash  . Daypro [Oxaprozin] Rash    Rash  . Levofloxacin Rash  . Phytonadione Rash  . Sulfonamide Derivatives Rash    Family History  Problem Relation Age of Onset  . Alzheimer's disease Mother   . Heart disease Mother   . Hyperlipidemia Mother   . Hypertension Mother   . Cancer  Maternal Grandmother     colon  . Heart disease Sister   . Hypertension Sister   . Heart attack Sister   . Heart disease Brother   . Heart attack Brother   . Cancer Daughter   . Hypertension Daughter   . Hypertension Son       Prior to Admission medications   Medication Sig Start Date End Date Taking? Authorizing Provider  ALPRAZolam [name]) 0.5 MG tablet Take 0.5 mg by mouth 2 (two) times daily as needed for sleep. Every evening and sometimes during the day if needed 06/07/12  Yes Historical Provider, MD  aspirin 81 MG tablet Take 81 mg by mouth daily at 6 PM.    Yes Historical Provider, MD  buPROPion (WELLBUTRIN SR) 200 MG 12 hr tablet Take 200 mg by mouth 2 (two) times daily.    Yes Historical Provider, MD  Calcium Carbonate-Vitamin D (CALCIUM + D PO) Take 600 mg by mouth daily.    Yes Historical Provider, MD  fluticasone (FLONASE) 50 MCG/ACT nasal spray Place 2 sprays into the nose daily as needed for allergies.  10/16/14  Yes Historical Provider, MD  HYDROcodone-acetaminophen (NORCO/VICODIN) 5-325 MG per tablet Take 1 tablet by mouth at bedtime.    Yes Historical Provider, MD  levothyroxine (SYNTHROID, LEVOTHROID) 112 MCG tablet Take 112 mcg by mouth daily before breakfast.   Yes Historical Provider, MD  Magnesium 400 MG TABS Take 400 mg by mouth daily.   Yes Historical Provider, MD  meclizine (ANTIVERT) 25 MG tablet Take 1 tablet (25 mg total) by mouth 3 (three) times daily as needed for dizziness. 02/17/16  Yes Charlesetta Shanks, MD  metoprolol succinate (TOPROL-XL) 25 MG 24 hr tablet Take 12.5 mg by mouth 2 (two) times daily.    Yes Historical Provider, MD  omeprazole (PRILOSEC) 20 MG capsule Take 20 mg by mouth daily.  02/06/13  Yes Historical Provider, MD  pravastatin (PRAVACHOL) 20 MG tablet Take 20 mg by mouth daily.   Yes Christain Sacramento, MD  rOPINIRole (REQUIP) 1 MG tablet Take 1 mg by mouth 2 (two) times daily.    Yes Historical Provider, MD  warfarin (COUMADIN) 5 MG tablet Take 1  tablet (5 mg total) by mouth daily. Patient taking differently: Take 2.5-5 mg by mouth See admin instructions. 2.5 mg every Thursday, 5 mg all other days 02/23/16  Yes Annia Belt, MD    Physical Exam: Vitals:   04/13/16 2030 04/13/16 2045 04/13/16 2100 04/13/16 2115  BP: 143/96 143/91 124/79 127/86  Pulse: 73 77 77 75  Resp:      Temp:      TempSrc:      SpO2: 99% 99% 99% 100%      Constitutional:  NAD, calm, comfortable Eyes: PERRL, lids and conjunctivae normal ENMT: Mucous membranes are moist. Posterior pharynx clear of any exudate or lesions.Normal dentition.  Neck: normal, supple, no masses, no thyromegaly Respiratory: clear to auscultation bilaterally, no wheezing, no crackles. Normal respiratory effort. No accessory muscle use.  Cardiovascular: Regular rate and rhythm, no murmurs / rubs / gallops. No extremity edema. 2+ pedal pulses. No carotid bruits.  Abdomen: no tenderness, no masses palpated. No hepatosplenomegaly. Bowel sounds positive.  Musculoskeletal: no clubbing / cyanosis. No joint deformity upper and lower extremities. Good ROM, no contractures. Normal muscle tone.  Skin: no rashes, lesions, ulcers. No induration Neurologic: CN 2-12 grossly intact. Sensation intact, DTR normal. Strength 5/5 in all 4.  Psychiatric: Normal judgment and insight. Alert and oriented x 3. Normal mood.    Labs on Admission: I have personally reviewed following labs and imaging studies  CBC:  Recent Labs Lab 04/13/16 1616  WBC 9.3  NEUTROABS 6.2  HGB 14.1  HCT 43.5  MCV 97.3  PLT 99991111   Basic Metabolic Panel:  Recent Labs Lab 04/13/16 1616  NA 138  K 4.2  CL 102  CO2 28  GLUCOSE 147*  BUN 17  CREATININE 1.42*  CALCIUM 9.7   GFR: CrCl cannot be calculated (Unknown ideal weight.). Liver Function Tests:  Recent Labs Lab 04/13/16 1616  AST 20  ALT 15  ALKPHOS 99  BILITOT 0.8  PROT 7.3  ALBUMIN 4.2   No results for input(s): LIPASE, AMYLASE in the last  168 hours. No results for input(s): AMMONIA in the last 168 hours. Coagulation Profile:  Recent Labs Lab 04/13/16 1601 04/13/16 1616  INR 1.6 1.39   Cardiac Enzymes: No results for input(s): CKTOTAL, CKMB, CKMBINDEX, TROPONINI in the last 168 hours. BNP (last 3 results) No results for input(s): PROBNP in the last 8760 hours. HbA1C: No results for input(s): HGBA1C in the last 72 hours. CBG: No results for input(s): GLUCAP in the last 168 hours. Lipid Profile: No results for input(s): CHOL, HDL, LDLCALC, TRIG, CHOLHDL, LDLDIRECT in the last 72 hours. Thyroid Function Tests: No results for input(s): TSH, T4TOTAL, FREET4, T3FREE, THYROIDAB in the last 72 hours. Anemia Panel: No results for input(s): VITAMINB12, FOLATE, FERRITIN, TIBC, IRON, RETICCTPCT in the last 72 hours. Urine analysis:    Component Value Date/Time   COLORURINE YELLOW 11/24/2008 0359   APPEARANCEUR CLEAR 11/24/2008 0359   LABSPEC 1.007 11/24/2008 0359   PHURINE 6.0 11/24/2008 0359   GLUCOSEU NEGATIVE 11/24/2008 0359   HGBUR MODERATE (A) 11/24/2008 0359   BILIRUBINUR NEGATIVE 11/24/2008 0359   KETONESUR NEGATIVE 11/24/2008 0359   PROTEINUR NEGATIVE 11/24/2008 0359   UROBILINOGEN 0.2 11/24/2008 0359   NITRITE NEGATIVE 11/24/2008 0359   LEUKOCYTESUR NEGATIVE 11/24/2008 0359   Sepsis Labs: @LABRCNTIP (procalcitonin:4,lacticidven:4) )No results found for this or any previous visit (from the past 240 hour(s)).   Radiological Exams on Admission: Ct Head Wo Contrast  Result Date: 04/13/2016 CLINICAL DATA:  Pt states triple vision on right side, and numbness on left side from knee to toes and elbow to finger tips x1 day. Pt also has numbness in left side of face. EXAM: CT HEAD WITHOUT CONTRAST TECHNIQUE: Contiguous axial images were obtained from the base of the skull through the vertex without intravenous contrast. COMPARISON:  Brain MRI and head CT, 02/17/2016 FINDINGS: Brain: No evidence of acute infarction,  hemorrhage, hydrocephalus, extra-axial collection or mass lesion/mass effect. Vascular: No hyperdense vessel or unexpected calcification. Skull: Normal. Negative for fracture or focal  lesion. Sinuses/Orbits: Minor right maxillary sinus and left sphenoid sinus mucosal thickening. Visualized sinuses otherwise clear. Normal globes and orbits. Other: Clear mastoid air cells. IMPRESSION: 1. No intracranial abnormality. Age related volume loss stable from the recent prior studies. 2. Sinus mucosal thickening, now only minor, improved from the recent prior exams. Electronically Signed   By: Lajean Manes M.D.   On: 04/13/2016 16:38   Mr Angiogram Head Wo Contrast  Result Date: 04/13/2016 CLINICAL DATA:  LEFT-sided numbness, vision changes beginning yesterday. History of anti phospholipid antibody syndrome,subtherapeutic on warfarin. History of breast cancer. EXAM: MRI HEAD WITH AND WITHOUT CONTRAST MRA HEAD WITHOUT CONTRAST MRA NECK WITHOUT AND WITH CONTRAST TECHNIQUE: Multiplanar, multiecho pulse sequences of the brain and surrounding structures were obtained with and without intravenous contrast. Angiographic images of the Circle of Willis were obtained using MRA technique without intravenous contrast. Angiographic images of the neck were obtained using MRA technique without and with intravenous contrast. Carotid stenosis measurements (when applicable) are obtained utilizing NASCET criteria, using the distal internal carotid diameter as the denominator. CONTRAST:  18 cc MultiHance COMPARISON:  CT HEAD April 13, 2016 at 1625 hours and MRI and MRA of the head February 17, 2016 FINDINGS: MRI HEAD FINDINGS BRAIN: No reduced diffusion to suggest acute ischemia. No susceptibility artifact to suggest hemorrhage. The ventricles and sulci are normal for patient's age. At least 10 sub cm enhancing foci in the cerebellum, brainstem. Sub cm enhancing lesions in the RIGHT greater than LEFT basal ganglia, corona radiata and  centrum semiovale. Corresponding mild T2 hyperintense signal. Patchy supratentorial white matter FLAIR T2 hyperintensities exclusive of the aforementioned abnormality, relatively unchanged. No midline shift or mass effect. No abnormal extra-axial fluid collections,abnormal extra-axial enhancement or masses. VASCULAR: Normal major intracranial vascular flow voids present at skull base. SKULL AND UPPER CERVICAL SPINE: No abnormal sellar expansion. No suspicious calvarial bone marrow signal. Craniocervical junction maintained. SINUSES/ORBITS: The mastoid air-cells and included paranasal sinuses are well-aerated. The included ocular globes and orbital contents are non-suspicious. OTHER:  None. MRA HEAD FINDINGS ANTERIOR CIRCULATION: Normal flow related enhancement of the included cervical, petrous, cavernous and supraclinoid internal carotid arteries. Patent anterior communicating artery. Normal flow related enhancement of the anterior and middle cerebral arteries,including distal segments.No large vessel occlusion, high-grade stenosis, abnormal luminal irregularity, aneurysm. POSTERIOR CIRCULATION: RIGHT vertebral artery is dominant. LEFT vertebral artery terminates in the posterior inferior cerebellar artery. Artery is patent, with normal flow related enhancement of the main branch vessels. Robust bilateral posterior communicating arteries present. Normal flow related enhancement of the posterior cerebral arteries.No large vessel occlusion, high-grade stenosis, abnormal luminal irregularity, aneurysm. MRA NECK FINDINGS Source images and MIP image were reviewed. The common carotid arteries are widely patent bilaterally. The carotid bifurcation is patent approximate 50% stenosis RIGHT internal carotid artery origin by NASCET criteria. Mild luminal irregularity of the bilateral vertebral arteries which remain patent throughout the course. IMPRESSION: MRI HEAD: Multiple subcentimeter foci of abnormal enhancement in the  supra- and infratentorial brain predominately in a perivascular distribution most consistent with leptomeningitis. Though findings are often infectious or inflammatory, given given patient's history of breast cancer, neoplasm is a consideration. Recommend correlation with cerebral spinal fluid studies. MRA HEAD: Negative ; no typical findings of vasculopathy. MRA NECK: 50% stenosis RIGHT internal carotid artery origin by NASCET criteria.Mild vertebral artery regularity favoring atherosclerosis. Acute findings discussed with and reconfirmed by PA BOWIE TRAN on 04/13/2016 at 7: 40 pm. Electronically Signed   By: Thana Farr.D.  On: 04/13/2016 19:53   Mr Angiogram Neck W Or Wo Contrast  Result Date: 04/13/2016 CLINICAL DATA:  LEFT-sided numbness, vision changes beginning yesterday. History of anti phospholipid antibody syndrome,subtherapeutic on warfarin. History of breast cancer. EXAM: MRI HEAD WITH AND WITHOUT CONTRAST MRA HEAD WITHOUT CONTRAST MRA NECK WITHOUT AND WITH CONTRAST TECHNIQUE: Multiplanar, multiecho pulse sequences of the brain and surrounding structures were obtained with and without intravenous contrast. Angiographic images of the Circle of Willis were obtained using MRA technique without intravenous contrast. Angiographic images of the neck were obtained using MRA technique without and with intravenous contrast. Carotid stenosis measurements (when applicable) are obtained utilizing NASCET criteria, using the distal internal carotid diameter as the denominator. CONTRAST:  18 cc MultiHance COMPARISON:  CT HEAD April 13, 2016 at 1625 hours and MRI and MRA of the head February 17, 2016 FINDINGS: MRI HEAD FINDINGS BRAIN: No reduced diffusion to suggest acute ischemia. No susceptibility artifact to suggest hemorrhage. The ventricles and sulci are normal for patient's age. At least 10 sub cm enhancing foci in the cerebellum, brainstem. Sub cm enhancing lesions in the RIGHT greater than LEFT basal  ganglia, corona radiata and centrum semiovale. Corresponding mild T2 hyperintense signal. Patchy supratentorial white matter FLAIR T2 hyperintensities exclusive of the aforementioned abnormality, relatively unchanged. No midline shift or mass effect. No abnormal extra-axial fluid collections,abnormal extra-axial enhancement or masses. VASCULAR: Normal major intracranial vascular flow voids present at skull base. SKULL AND UPPER CERVICAL SPINE: No abnormal sellar expansion. No suspicious calvarial bone marrow signal. Craniocervical junction maintained. SINUSES/ORBITS: The mastoid air-cells and included paranasal sinuses are well-aerated. The included ocular globes and orbital contents are non-suspicious. OTHER:  None. MRA HEAD FINDINGS ANTERIOR CIRCULATION: Normal flow related enhancement of the included cervical, petrous, cavernous and supraclinoid internal carotid arteries. Patent anterior communicating artery. Normal flow related enhancement of the anterior and middle cerebral arteries,including distal segments.No large vessel occlusion, high-grade stenosis, abnormal luminal irregularity, aneurysm. POSTERIOR CIRCULATION: RIGHT vertebral artery is dominant. LEFT vertebral artery terminates in the posterior inferior cerebellar artery. Artery is patent, with normal flow related enhancement of the main branch vessels. Robust bilateral posterior communicating arteries present. Normal flow related enhancement of the posterior cerebral arteries.No large vessel occlusion, high-grade stenosis, abnormal luminal irregularity, aneurysm. MRA NECK FINDINGS Source images and MIP image were reviewed. The common carotid arteries are widely patent bilaterally. The carotid bifurcation is patent approximate 50% stenosis RIGHT internal carotid artery origin by NASCET criteria. Mild luminal irregularity of the bilateral vertebral arteries which remain patent throughout the course. IMPRESSION: MRI HEAD: Multiple subcentimeter foci of  abnormal enhancement in the supra- and infratentorial brain predominately in a perivascular distribution most consistent with leptomeningitis. Though findings are often infectious or inflammatory, given given patient's history of breast cancer, neoplasm is a consideration. Recommend correlation with cerebral spinal fluid studies. MRA HEAD: Negative ; no typical findings of vasculopathy. MRA NECK: 50% stenosis RIGHT internal carotid artery origin by NASCET criteria.Mild vertebral artery regularity favoring atherosclerosis. Acute findings discussed with and reconfirmed by PA BOWIE TRAN on 04/13/2016 at 7: 40 pm. Electronically Signed   By: Elon Alas M.D.   On: 04/13/2016 19:53   Mr Jeri Cos [id number] Contrast  Result Date: 04/13/2016 CLINICAL DATA:  LEFT-sided numbness, vision changes beginning yesterday. History of anti phospholipid antibody syndrome, subtherapeutic on warfarin. History of breast cancer. EXAM: MRI HEAD WITH AND WITHOUT CONTRAST MRA HEAD WITHOUT CONTRAST MRA NECK WITHOUT AND WITH CONTRAST TECHNIQUE: Multiplanar, multiecho pulse sequences of the brain and  surrounding structures were obtained with and without intravenous contrast. Angiographic images of the Circle of Willis were obtained using MRA technique without intravenous contrast. Angiographic images of the neck were obtained using MRA technique without and with intravenous contrast. Carotid stenosis measurements (when applicable) are obtained utilizing NASCET criteria, using the distal internal carotid diameter as the denominator. CONTRAST:  18 cc MultiHance COMPARISON:  CT HEAD April 13, 2016 at 1625 hours and MRI and MRA of the head February 17, 2016 FINDINGS: MRI HEAD FINDINGS BRAIN: No reduced diffusion to suggest acute ischemia. No susceptibility artifact to suggest hemorrhage. The ventricles and sulci are normal for patient's age. At least 10 sub cm enhancing foci in the cerebellum, brainstem. Sub cm enhancing lesions in the RIGHT  greater than LEFT basal ganglia, corona radiata and centrum semiovale. Corresponding mild T2 hyperintense signal. Patchy supratentorial white matter FLAIR T2 hyperintensities exclusive of the aforementioned abnormality, relatively unchanged. No midline shift or mass effect. No abnormal extra-axial fluid collections, abnormal extra-axial enhancement or masses. VASCULAR: Normal major intracranial vascular flow voids present at skull base. SKULL AND UPPER CERVICAL SPINE: No abnormal sellar expansion. No suspicious calvarial bone marrow signal. Craniocervical junction maintained. SINUSES/ORBITS: The mastoid air-cells and included paranasal sinuses are well-aerated. The included ocular globes and orbital contents are non-suspicious. OTHER: None. MRA HEAD FINDINGS ANTERIOR CIRCULATION: Normal flow related enhancement of the included cervical, petrous, cavernous and supraclinoid internal carotid arteries. Patent anterior communicating artery. Normal flow related enhancement of the anterior and middle cerebral arteries, including distal segments. No large vessel occlusion, high-grade stenosis, abnormal luminal irregularity, aneurysm. POSTERIOR CIRCULATION: RIGHT vertebral artery is dominant. LEFT vertebral artery terminates in the posterior inferior cerebellar artery. Artery is patent, with normal flow related enhancement of the main branch vessels. Robust bilateral posterior communicating arteries present. Normal flow related enhancement of the posterior cerebral arteries. No large vessel occlusion, high-grade stenosis, abnormal luminal irregularity, aneurysm. MRA NECK FINDINGS Source images and MIP image were reviewed. The common carotid arteries are widely patent bilaterally. The carotid bifurcation is patent approximate 50% stenosis RIGHT internal carotid artery origin by NASCET criteria. Mild luminal irregularity of the bilateral vertebral arteries which remain patent throughout the course. IMPRESSION: MRI HEAD:  Multiple subcentimeter foci of abnormal enhancement in the supra- and infratentorial brain predominately in a perivascular distribution most consistent with leptomeningitis. Though findings are often infectious or inflammatory, given given patient's history of breast cancer, neoplasm is a consideration. Recommend correlation with cerebral spinal fluid studies. MRA HEAD: Negative ; no typical findings of vasculopathy. MRA NECK: 50% stenosis RIGHT internal carotid artery origin by NASCET criteria. Mild vertebral artery regularity favoring atherosclerosis. Acute findings discussed with and reconfirmed by PA BOWIE TRAN on 04/13/2016 at 7:40 pm. Electronically Signed   By: Elon Alas M.D.   On: 04/13/2016 19:44    EKG: Independently reviewed.  Assessment/Plan Principal Problem:   Vision changes Active Problems:   Antiphospholipid antibody syndrome (HCC)   Chronic anticoagulation    1. Vision change - Neurology wants TIA work up 1. Admitting for 2d echo 2. See Dr. Les Pou note 2. Subtherapeutic coumadin - heparin gtt, holding coumadin, per Dr. Nicole Kindred note they may end up getting LP   DVT prophylaxis: Heparin gtt Code Status: Full Family Communication: No family in room Consults called: None Admission status: Admit to obs   Etta Quill DO [facility] Pager [phone] from 7PM-7AM  If 7AM-7PM, please contact the day physician for the patient [email or web address] Password [facility]  04/13/2016, 9:36 PM

## 2016-04-13 NOTE — Consult Note (Signed)
Admission H&P    Chief Complaint: Visual changes involving right eye and left-sided numbness.  HPI: [name] is an 69 y.o. female with a history of hypertension, hyperlipidemia, hypothyroidism, stroke, TIA, ETT, antiphospholipid antibody syndrome, chronic anticoagulation with Coumadin, fibromyalgia, depression and anxiety, presenting with complaint of left sided numbness as well as triple vision involving her right eye. Symptoms were first noticed yesterday morning. She's had no symptoms of this nature previously. She is not been experiencing a headache. Change in speech as been noted. She's had no facial droop. INR today was 1.6. CT scan of her head showed no acute intracranial abnormality. MRI of her brain showed no signs of an acute stroke. 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 Patient has not been experiencing headache nor neck pain. She has been afebrile.  LSN: 04/12/2016 tPA Given: No: No clear objective focal deficit mRankin:  Past Medical History:  Diagnosis Date  . Anxiety    severe panic attacks  . Arthritis   . Asthma    as child  . Breast cancer (HCC)    right  . Chest pain   . Chronic anticoagulation 08/04/2015  . Collapsed lung    hx of, left  . Complication of anesthesia    "hard to put asleep"  . Depression   . Fibromyalgia   . GERD (gastroesophageal reflux disease)   . H/O hiatal hernia   . Headache(784.0)   . Hepatitis 1990   "from eating at restaurant"  . History of antiphospholipid antibody syndrome   . 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  . Hx-TIA (transient ischemic attack)   . Hyperlipidemia   . Hypertension   . Hypothyroidism   . Neuromuscular disorder (HCC)   . Pneumonia    hx of  . Stroke (HCC)   . 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 ABDOMINAL HYSTERECTOMY    . BREAST SURGERY Right    x3  . BUNIONECTOMY Bilateral 30 years ago  . CARDIAC CATHETERIZATION  07/12/1997   NORMAL LEFT VENTRICULAR FUNCTION WITH EF AT LEAST 70-75%  . ESOPHAGEAL MANOMETRY N/A 11/06/2012   Procedure: ESOPHAGEAL MANOMETRY (EM);  Surgeon: Matthew B Martin, MD;  Location: [facility];  Service: General;  Laterality: N/A;  . ESOPHAGOGASTRODUODENOSCOPY ENDOSCOPY    . KNEE ARTHROSCOPY Right   . LAPAROSCOPIC NISSEN FUNDOPLICATION N/A 12/12/2012   Procedure: LAPAROSCOPIC TAKEDOWN  PERI-HIATAL HERNIA    REPAIR;  Surgeon: Matthew B Martin, MD;  Location: [facility];  Service: General;  Laterality: N/A;  . LEFT OOPHORECTOMY  1982  . RIGHT OOPHORECTOMY  1980  . THYROIDECTOMY  in 20's  . TONSILLECTOMY  70 years old  . UPPER GI ENDOSCOPY N/A 12/12/2012   Procedure: UPPER GI ENDOSCOPY;  Surgeon: Matthew B Martin, MD;  Location: [facility];  Service: General;  Laterality: N/A;    Family History  Problem Relation Age of Onset  . Alzheimer's disease Mother   . Heart disease Mother   . Hyperlipidemia Mother   . Hypertension Mother   . Cancer Maternal Grandmother     colon  . Heart disease Sister   . Hypertension Sister   . Heart attack Sister   . Heart disease Brother   .   Heart attack Brother   . Cancer Daughter   . Hypertension Daughter   . Hypertension Son    Social History:  reports that she has never smoked. She has never used smokeless tobacco. She reports that she does not drink alcohol or use drugs.  Allergies:  Allergies  Allergen Reactions  . Bee Venom Anaphylaxis  . Hornet Venom Anaphylaxis    Anaphylaxis Shock SHOCK  . Reglan [Metoclopramide] Shortness Of Breath and Other (See Comments)    Tongue got numb; didn't feel good  . Vitamin K Anaphylaxis and Rash    "kills me" iv  . Vitamin K And Related Anaphylaxis  .  Benadryl [Diphenhydramine Hcl]     Rash  . Crestor [Rosuvastatin Calcium]     Muscle Aches  . Doxycycline     Tongue burning, thrush  . Levofloxacin     Rash  . Zocor [Simvastatin]     Muscle Aches  . Ciprofloxacin Rash  . Daypro [Oxaprozin] Rash    Rash  . Levofloxacin Rash  . Phytonadione Rash  . Sulfonamide Derivatives Rash    Medications: Preadmission medications reviewed by me.  ROS: History obtained from the patient  General ROS: negative for - chills, fatigue, fever, night sweats, weight gain or weight loss Psychological ROS: negative for - behavioral disorder, hallucinations, memory difficulties, mood swings or suicidal ideation Ophthalmic ROS: As noted in present illness ENT ROS: negative for - epistaxis, nasal discharge, oral lesions, sore throat, tinnitus or vertigo Allergy and Immunology ROS: negative for - hives or itchy/watery eyes Hematological and Lymphatic ROS: negative for - bleeding problems, bruising or swollen lymph nodes Endocrine ROS: negative for - galactorrhea, hair pattern changes, polydipsia/polyuria or temperature intolerance Respiratory ROS: negative for - cough, hemoptysis, shortness of breath or wheezing Cardiovascular ROS: negative for - chest pain, dyspnea on exertion, edema or irregular heartbeat Gastrointestinal ROS: negative for - abdominal pain, diarrhea, hematemesis, nausea/vomiting or stool incontinence Genito-Urinary ROS: negative for - dysuria, hematuria, incontinence or urinary frequency/urgency Musculoskeletal ROS: negative for - joint swelling or muscular weakness Neurological ROS: as noted in HPI Dermatological ROS: negative for rash and skin lesion changes  Physical Examination: Blood pressure 143/96, pulse 73, temperature 97.9 F (36.6 C), temperature source Oral, resp. rate 16, SpO2 99 %.  HEENT-  Normocephalic, no lesions, without obvious abnormality.  Normal external eye and conjunctiva.  Normal TM's bilaterally.  Normal  auditory canals and external ears. Normal external nose, mucus membranes and septum.  Normal pharynx. Neck supple with no masses, nodes, nodules or enlargement. Cardiovascular - regular rate and rhythm, S1, S2 normal, no murmur, click, rub or gallop Lungs - chest clear, no wheezing, rales, normal symmetric air entry Abdomen - soft, non-tender; bowel sounds normal; no masses,  no organomegaly Extremities - no joint deformities, effusion, or inflammation  Neurologic Examination: Mental Status: Alert, oriented, no acute distress.  Speech fluent without evidence of aphasia. Able to follow commands without difficulty. Cranial Nerves: II-Visual fields were normal with testing right and left eyes individually. III/IV/VI-Pupils were equal and reacted. Extraocular movements were full and conjugate.    V/VII-reduced perception of tactile sensation of left-sided face compared to the right; no facial weakness. VIII-normal. X-normal speech and symmetrical palatal movement. XI: trapezius strength/neck flexion strength normal bilaterally XII-midline tongue extension with normal strength. Motor: 5/5 bilaterally with normal tone and bulk Sensory: Normal throughout. Deep Tendon Reflexes: 1+ and symmetric in upper extremities and absent in lower extremities. Plantars: Flexor bilaterally Cerebellar: Normal finger-to-nose testing. Carotid   auscultation: Normal  Results for orders placed or performed during the hospital encounter of 04/13/16 (from the past 48 hour(s))  CBC with Differential/Platelet     Status: None   Collection Time: 04/13/16  4:16 PM  Result Value Ref Range   WBC 9.3 4.0 - 10.5 K/uL   RBC 4.47 3.87 - 5.11 MIL/uL   Hemoglobin 14.1 12.0 - 15.0 g/dL   HCT 43.5 36.0 - 46.0 %   MCV 97.3 78.0 - 100.0 fL   MCH 31.5 26.0 - 34.0 pg   MCHC 32.4 30.0 - 36.0 g/dL   RDW 13.9 11.5 - 15.5 %   Platelets 265 150 - 400 K/uL   Neutrophils Relative % 66 %   Neutro Abs 6.2 1.7 - 7.7 K/uL   Lymphocytes  Relative 27 %   Lymphs Abs 2.5 0.7 - 4.0 K/uL   Monocytes Relative 4 %   Monocytes Absolute 0.4 0.1 - 1.0 K/uL   Eosinophils Relative 2 %   Eosinophils Absolute 0.2 0.0 - 0.7 K/uL   Basophils Relative 1 %   Basophils Absolute 0.1 0.0 - 0.1 K/uL  Comprehensive metabolic panel     Status: Abnormal   Collection Time: 04/13/16  4:16 PM  Result Value Ref Range   Sodium 138 135 - 145 mmol/L   Potassium 4.2 3.5 - 5.1 mmol/L   Chloride 102 101 - 111 mmol/L   CO2 28 22 - 32 mmol/L   Glucose, Bld 147 (H) 65 - 99 mg/dL   BUN 17 6 - 20 mg/dL   Creatinine, Ser 1.42 (H) 0.44 - 1.00 mg/dL   Calcium 9.7 8.9 - 10.3 mg/dL   Total Protein 7.3 6.5 - 8.1 g/dL   Albumin 4.2 3.5 - 5.0 g/dL   AST 20 15 - 41 U/L   ALT 15 14 - 54 U/L   Alkaline Phosphatase 99 38 - 126 U/L   Total Bilirubin 0.8 0.3 - 1.2 mg/dL   GFR calc non Af Amer 37 (L) >60 mL/min   GFR calc Af Amer 43 (L) >60 mL/min    Comment: (NOTE) The eGFR has been calculated using the CKD EPI equation. This calculation has not been validated in all clinical situations. eGFR's persistently <60 mL/min signify possible Chronic Kidney Disease.    Anion gap 8 5 - 15  Protime-INR     Status: Abnormal   Collection Time: 04/13/16  4:16 PM  Result Value Ref Range   Prothrombin Time 17.2 (H) 11.4 - 15.2 seconds   INR 1.39   APTT     Status: None   Collection Time: 04/13/16  4:16 PM  Result Value Ref Range   aPTT 32 24 - 36 seconds   Ct Head Wo Contrast  Result Date: 04/13/2016 CLINICAL DATA:  Pt states triple vision on right side, and numbness on left side from knee to toes and elbow to finger tips x1 day. Pt also has numbness in left side of face. EXAM: CT HEAD WITHOUT CONTRAST TECHNIQUE: Contiguous axial images were obtained from the base of the skull through the vertex without intravenous contrast. COMPARISON:  Brain MRI and head CT, 02/17/2016 FINDINGS: Brain: No evidence of acute infarction, hemorrhage, hydrocephalus, extra-axial collection or  mass lesion/mass effect. Vascular: No hyperdense vessel or unexpected calcification. Skull: Normal. Negative for fracture or focal lesion. Sinuses/Orbits: Minor right maxillary sinus and left sphenoid sinus mucosal thickening. Visualized sinuses otherwise clear. Normal globes and orbits. Other: Clear mastoid air cells. IMPRESSION: 1. No intracranial abnormality. Age  related volume loss stable from the recent prior studies. 2. Sinus mucosal thickening, now only minor, improved from the recent prior exams. Electronically Signed   By: David  Ormond M.D.   On: 04/13/2016 16:38   Mr Angiogram Head Wo Contrast  Result Date: 04/13/2016 CLINICAL DATA:  LEFT-sided numbness, vision changes beginning yesterday. History of anti phospholipid antibody syndrome,subtherapeutic on warfarin. History of breast cancer. EXAM: MRI HEAD WITH AND WITHOUT CONTRAST MRA HEAD WITHOUT CONTRAST MRA NECK WITHOUT AND WITH CONTRAST TECHNIQUE: Multiplanar, multiecho pulse sequences of the brain and surrounding structures were obtained with and without intravenous contrast. Angiographic images of the Circle of Willis were obtained using MRA technique without intravenous contrast. Angiographic images of the neck were obtained using MRA technique without and with intravenous contrast. Carotid stenosis measurements (when applicable) are obtained utilizing NASCET criteria, using the distal internal carotid diameter as the denominator. CONTRAST:  18 cc MultiHance COMPARISON:  CT HEAD April 13, 2016 at 1625 hours and MRI and MRA of the head February 17, 2016 FINDINGS: MRI HEAD FINDINGS BRAIN: No reduced diffusion to suggest acute ischemia. No susceptibility artifact to suggest hemorrhage. The ventricles and sulci are normal for patient's age. At least 10 sub cm enhancing foci in the cerebellum, brainstem. Sub cm enhancing lesions in the RIGHT greater than LEFT basal ganglia, corona radiata and centrum semiovale. Corresponding mild T2 hyperintense  signal. Patchy supratentorial white matter FLAIR T2 hyperintensities exclusive of the aforementioned abnormality, relatively unchanged. No midline shift or mass effect. No abnormal extra-axial fluid collections,abnormal extra-axial enhancement or masses. VASCULAR: Normal major intracranial vascular flow voids present at skull base. SKULL AND UPPER CERVICAL SPINE: No abnormal sellar expansion. No suspicious calvarial bone marrow signal. Craniocervical junction maintained. SINUSES/ORBITS: The mastoid air-cells and included paranasal sinuses are well-aerated. The included ocular globes and orbital contents are non-suspicious. OTHER:  None. MRA HEAD FINDINGS ANTERIOR CIRCULATION: Normal flow related enhancement of the included cervical, petrous, cavernous and supraclinoid internal carotid arteries. Patent anterior communicating artery. Normal flow related enhancement of the anterior and middle cerebral arteries,including distal segments.No large vessel occlusion, high-grade stenosis, abnormal luminal irregularity, aneurysm. POSTERIOR CIRCULATION: RIGHT vertebral artery is dominant. LEFT vertebral artery terminates in the posterior inferior cerebellar artery. Artery is patent, with normal flow related enhancement of the main branch vessels. Robust bilateral posterior communicating arteries present. Normal flow related enhancement of the posterior cerebral arteries.No large vessel occlusion, high-grade stenosis, abnormal luminal irregularity, aneurysm. MRA NECK FINDINGS Source images and MIP image were reviewed. The common carotid arteries are widely patent bilaterally. The carotid bifurcation is patent approximate 50% stenosis RIGHT internal carotid artery origin by NASCET criteria. Mild luminal irregularity of the bilateral vertebral arteries which remain patent throughout the course. IMPRESSION: MRI HEAD: Multiple subcentimeter foci of abnormal enhancement in the supra- and infratentorial brain predominately in a  perivascular distribution most consistent with leptomeningitis. Though findings are often infectious or inflammatory, given given patient's history of breast cancer, neoplasm is a consideration. Recommend correlation with cerebral spinal fluid studies. MRA HEAD: Negative ; no typical findings of vasculopathy. MRA NECK: 50% stenosis RIGHT internal carotid artery origin by NASCET criteria.Mild vertebral artery regularity favoring atherosclerosis. Acute findings discussed with and reconfirmed by PA BOWIE TRAN on 04/13/2016 at 7: 40 pm. Electronically Signed   By: Courtnay  Bloomer M.D.   On: 04/13/2016 19:53   Mr Angiogram Neck W Or Wo Contrast  Result Date: 04/13/2016 CLINICAL DATA:  LEFT-sided numbness, vision changes beginning yesterday. History of anti phospholipid antibody syndrome,subtherapeutic   on warfarin. History of breast cancer. EXAM: MRI HEAD WITH AND WITHOUT CONTRAST MRA HEAD WITHOUT CONTRAST MRA NECK WITHOUT AND WITH CONTRAST TECHNIQUE: Multiplanar, multiecho pulse sequences of the brain and surrounding structures were obtained with and without intravenous contrast. Angiographic images of the Circle of Willis were obtained using MRA technique without intravenous contrast. Angiographic images of the neck were obtained using MRA technique without and with intravenous contrast. Carotid stenosis measurements (when applicable) are obtained utilizing NASCET criteria, using the distal internal carotid diameter as the denominator. CONTRAST:  18 cc MultiHance COMPARISON:  CT HEAD April 13, 2016 at 1625 hours and MRI and MRA of the head February 17, 2016 FINDINGS: MRI HEAD FINDINGS BRAIN: No reduced diffusion to suggest acute ischemia. No susceptibility artifact to suggest hemorrhage. The ventricles and sulci are normal for patient's age. At least 10 sub cm enhancing foci in the cerebellum, brainstem. Sub cm enhancing lesions in the RIGHT greater than LEFT basal ganglia, corona radiata and centrum semiovale.  Corresponding mild T2 hyperintense signal. Patchy supratentorial white matter FLAIR T2 hyperintensities exclusive of the aforementioned abnormality, relatively unchanged. No midline shift or mass effect. No abnormal extra-axial fluid collections,abnormal extra-axial enhancement or masses. VASCULAR: Normal major intracranial vascular flow voids present at skull base. SKULL AND UPPER CERVICAL SPINE: No abnormal sellar expansion. No suspicious calvarial bone marrow signal. Craniocervical junction maintained. SINUSES/ORBITS: The mastoid air-cells and included paranasal sinuses are well-aerated. The included ocular globes and orbital contents are non-suspicious. OTHER:  None. MRA HEAD FINDINGS ANTERIOR CIRCULATION: Normal flow related enhancement of the included cervical, petrous, cavernous and supraclinoid internal carotid arteries. Patent anterior communicating artery. Normal flow related enhancement of the anterior and middle cerebral arteries,including distal segments.No large vessel occlusion, high-grade stenosis, abnormal luminal irregularity, aneurysm. POSTERIOR CIRCULATION: RIGHT vertebral artery is dominant. LEFT vertebral artery terminates in the posterior inferior cerebellar artery. Artery is patent, with normal flow related enhancement of the main branch vessels. Robust bilateral posterior communicating arteries present. Normal flow related enhancement of the posterior cerebral arteries.No large vessel occlusion, high-grade stenosis, abnormal luminal irregularity, aneurysm. MRA NECK FINDINGS Source images and MIP image were reviewed. The common carotid arteries are widely patent bilaterally. The carotid bifurcation is patent approximate 50% stenosis RIGHT internal carotid artery origin by NASCET criteria. Mild luminal irregularity of the bilateral vertebral arteries which remain patent throughout the course. IMPRESSION: MRI HEAD: Multiple subcentimeter foci of abnormal enhancement in the supra- and  infratentorial brain predominately in a perivascular distribution most consistent with leptomeningitis. Though findings are often infectious or inflammatory, given given patient's history of breast cancer, neoplasm is a consideration. Recommend correlation with cerebral spinal fluid studies. MRA HEAD: Negative ; no typical findings of vasculopathy. MRA NECK: 50% stenosis RIGHT internal carotid artery origin by NASCET criteria.Mild vertebral artery regularity favoring atherosclerosis. Acute findings discussed with and reconfirmed by PA BOWIE TRAN on 04/13/2016 at 7: 40 pm. Electronically Signed   By: Courtnay  Bloomer M.D.   On: 04/13/2016 19:53   Mr Brain W Wo Contrast  Result Date: 04/13/2016 CLINICAL DATA:  LEFT-sided numbness, vision changes beginning yesterday. History of anti phospholipid antibody syndrome, subtherapeutic on warfarin. History of breast cancer. EXAM: MRI HEAD WITH AND WITHOUT CONTRAST MRA HEAD WITHOUT CONTRAST MRA NECK WITHOUT AND WITH CONTRAST TECHNIQUE: Multiplanar, multiecho pulse sequences of the brain and surrounding structures were obtained with and without intravenous contrast. Angiographic images of the Circle of Willis were obtained using MRA technique without intravenous contrast. Angiographic images of the neck were obtained   using MRA technique without and with intravenous contrast. Carotid stenosis measurements (when applicable) are obtained utilizing NASCET criteria, using the distal internal carotid diameter as the denominator. CONTRAST:  18 cc MultiHance COMPARISON:  CT HEAD April 13, 2016 at 1625 hours and MRI and MRA of the head February 17, 2016 FINDINGS: MRI HEAD FINDINGS BRAIN: No reduced diffusion to suggest acute ischemia. No susceptibility artifact to suggest hemorrhage. The ventricles and sulci are normal for patient's age. At least 10 sub cm enhancing foci in the cerebellum, brainstem. Sub cm enhancing lesions in the RIGHT greater than LEFT basal ganglia, corona  radiata and centrum semiovale. Corresponding mild T2 hyperintense signal. Patchy supratentorial white matter FLAIR T2 hyperintensities exclusive of the aforementioned abnormality, relatively unchanged. No midline shift or mass effect. No abnormal extra-axial fluid collections, abnormal extra-axial enhancement or masses. VASCULAR: Normal major intracranial vascular flow voids present at skull base. SKULL AND UPPER CERVICAL SPINE: No abnormal sellar expansion. No suspicious calvarial bone marrow signal. Craniocervical junction maintained. SINUSES/ORBITS: The mastoid air-cells and included paranasal sinuses are well-aerated. The included ocular globes and orbital contents are non-suspicious. OTHER: None. MRA HEAD FINDINGS ANTERIOR CIRCULATION: Normal flow related enhancement of the included cervical, petrous, cavernous and supraclinoid internal carotid arteries. Patent anterior communicating artery. Normal flow related enhancement of the anterior and middle cerebral arteries, including distal segments. No large vessel occlusion, high-grade stenosis, abnormal luminal irregularity, aneurysm. POSTERIOR CIRCULATION: RIGHT vertebral artery is dominant. LEFT vertebral artery terminates in the posterior inferior cerebellar artery. Artery is patent, with normal flow related enhancement of the main branch vessels. Robust bilateral posterior communicating arteries present. Normal flow related enhancement of the posterior cerebral arteries. No large vessel occlusion, high-grade stenosis, abnormal luminal irregularity, aneurysm. MRA NECK FINDINGS Source images and MIP image were reviewed. The common carotid arteries are widely patent bilaterally. The carotid bifurcation is patent approximate 50% stenosis RIGHT internal carotid artery origin by NASCET criteria. Mild luminal irregularity of the bilateral vertebral arteries which remain patent throughout the course. IMPRESSION: MRI HEAD: Multiple subcentimeter foci of abnormal  enhancement in the supra- and infratentorial brain predominately in a perivascular distribution most consistent with leptomeningitis. Though findings are often infectious or inflammatory, given given patient's history of breast cancer, neoplasm is a consideration. Recommend correlation with cerebral spinal fluid studies. MRA HEAD: Negative ; no typical findings of vasculopathy. MRA NECK: 50% stenosis RIGHT internal carotid artery origin by NASCET criteria. Mild vertebral artery regularity favoring atherosclerosis. Acute findings discussed with and reconfirmed by PA BOWIE TRAN on 04/13/2016 at 7:40 pm. Electronically Signed   By: Courtnay  Bloomer M.D.   On: 04/13/2016 19:44    Assessment: 69 y.o. female with multiple risk factors for stroke as well as history of stroke presenting with possible transient ischemic attack with persistent numbness involving her left side. MRI showed no acute ischemic infarction. Significance of changes and meninges with contrast enhancement is unclear. Patient has no clinical signs nor symptoms of meningitis.  Stroke Risk Factors - hyperlipidemia and Antiphospholipid antibody syndrome, hypertension  Plan: 1. HgbA1c, fasting lipid panel 2. PT consult 3. Echocardiogram 4. Prophylactic therapy-Anticoagulation: Coumadin 5. Risk factor modification 6. Telemetry monitoring 7. Will consider further evaluation of leptomeningeal findings with lumbar puncture. However, risk of discontinuing anticoagulation will have to be weighed against potential clinical benefit of CSF results.  C.R. Stewart, MD [facility] [phone]  04/13/2016, 8:55 PM    

## 2016-04-13 NOTE — Progress Notes (Signed)
Patient arrived to unit via ED stretcher, belongings at bedside. Oriented to unit, vitals stable, tele applied. Will continue to monitor.

## 2016-04-13 NOTE — ED Notes (Signed)
Ambulated pt in hallway. Pt ambulated self efficiently just needed guidance.

## 2016-04-14 ENCOUNTER — Observation Stay (HOSPITAL_BASED_OUTPATIENT_CLINIC_OR_DEPARTMENT_OTHER): Payer: Medicare HMO

## 2016-04-14 DIAGNOSIS — R9402 Abnormal brain scan: Secondary | ICD-10-CM | POA: Diagnosis not present

## 2016-04-14 DIAGNOSIS — G459 Transient cerebral ischemic attack, unspecified: Secondary | ICD-10-CM

## 2016-04-14 DIAGNOSIS — Z7901 Long term (current) use of anticoagulants: Secondary | ICD-10-CM | POA: Diagnosis not present

## 2016-04-14 DIAGNOSIS — D6861 Antiphospholipid syndrome: Secondary | ICD-10-CM | POA: Diagnosis not present

## 2016-04-14 DIAGNOSIS — H539 Unspecified visual disturbance: Secondary | ICD-10-CM

## 2016-04-14 LAB — HEPARIN LEVEL (UNFRACTIONATED)
Heparin Unfractionated: 0.14 IU/mL — ABNORMAL LOW (ref 0.30–0.70)
Heparin Unfractionated: 0.73 IU/mL — ABNORMAL HIGH (ref 0.30–0.70)

## 2016-04-14 LAB — LIPID PANEL
Cholesterol: 204 mg/dL — ABNORMAL HIGH (ref 0–200)
HDL: 40 mg/dL — ABNORMAL LOW (ref >40)
LDL Cholesterol: 118 mg/dL — ABNORMAL HIGH (ref 0–99)
Total CHOL/HDL Ratio: 5.1 RATIO
Triglycerides: 231 mg/dL — ABNORMAL HIGH (ref <150)
VLDL: 46 mg/dL — ABNORMAL HIGH (ref 0–40)

## 2016-04-14 LAB — CBC
HCT: 43.3 % (ref 36.0–46.0)
Hemoglobin: 13.8 g/dL (ref 12.0–15.0)
MCH: 31.2 pg (ref 26.0–34.0)
MCHC: 31.9 g/dL (ref 30.0–36.0)
MCV: 97.7 fL (ref 78.0–100.0)
Platelets: 276 10*3/uL (ref 150–400)
RBC: 4.43 MIL/uL (ref 3.87–5.11)
RDW: 14 % (ref 11.5–15.5)
WBC: 10.3 10*3/uL (ref 4.0–10.5)

## 2016-04-14 LAB — ECHOCARDIOGRAM COMPLETE
Height: 64.961 in
Weight: 2880 oz

## 2016-04-14 NOTE — Evaluation (Signed)
Occupational Therapy Evaluation Patient Details Name: [name] MRN: [id number] DOB: 01/11/1946 Today's Date: 04/14/2016    History of Present Illness 70 y.o. female with medical history significant of antiphospholipid AB syndrome, chronic anticoagulation with coumadin, TIA, HLD, HTN.  Patient presents to the ED with c/o left sided numbness and blurred vision involving R eye. CT and MRI on 9/19 negative for acute infarct.   Clinical Impression   Pt reports she was independent with ADL PTA. Currently pt overall min guard for ADL and functional mobility. Pt presenting with R visual field diplopia and blurriness, impaired standing balance, and mild L UE decreased strength and sensation impacting her independence and safety with ADL and functional mobility. Pt planning to d/c home with 24/7 supervision from family. Recommending Outpatient Neuro OT for follow up. Pt would benefit from continued skilled OT to address established goals.    Follow Up Recommendations  Outpatient OT;Supervision/Assistance - 24 hour (neuro)    Equipment Recommendations  Tub/shower seat    Recommendations for Other Services PT consult     Precautions / Restrictions Precautions Precautions: None Precaution Comments: vision changes Restrictions Weight Bearing Restrictions: No      Mobility Bed Mobility Overal bed mobility: Needs Assistance Bed Mobility: Supine to Sit     Supine to sit: Supervision;HOB elevated     General bed mobility comments: Supervision for safety. HOB elevated without use of bed rail.  Transfers Overall transfer level: Needs assistance Equipment used: None Transfers: Sit to/from Stand Sit to Stand: Supervision         General transfer comment: Supervision for safety. Pt mildly unsteady in standing (dynamic>static).    Balance Overall balance assessment: Needs assistance Sitting-balance support: Feet supported;No upper extremity supported Sitting balance-Leahy  Scale: Good     Standing balance support: No upper extremity supported;During functional activity Standing balance-Leahy Scale: Fair Standing balance comment: Reaching for objects to steady with dynamic standing.                            ADL Overall ADL's : Needs assistance/impaired Eating/Feeding: Set up;Sitting   Grooming: Min guard;Standing;Wash/dry hands   Upper Body Bathing: Set up;Supervision/ safety;Sitting   Lower Body Bathing: Min guard;Sit to/from stand   Upper Body Dressing : Set up;Supervision/safety;Sitting   Lower Body Dressing: Min guard;Sit to/from stand Lower Body Dressing Details (indicate cue type and reason): Pt able to don socks sitting EOB Toilet Transfer: Min guard;Ambulation;Regular Glass blower/designer Details (indicate cue type and reason): Simulated by sit to stand from EOB with functional mobility in room Toileting- Clothing Manipulation and Hygiene: Min guard;Sit to/from stand       Functional mobility during ADLs: Min guard General ADL Comments: Pt mildly unsteady on feet; ?due to visual disturbances. Educated pt on no driving due to visual changes; pt reports sister can drive pt to appointments if needed.     Vision Vision Assessment?: Yes Eye Alignment: Within Functional Limits Ocular Range of Motion: Within Functional Limits Alignment/Gaze Preference: Within Defined Limits Tracking/Visual Pursuits: Able to track stimulus in all quads without difficulty Visual Fields: Right visual field deficit (blurred vision in R visual field) Diplopia Assessment: Only with right gaze;Present in near gaze;Present in far gaze;Objects split side to side;Disappears with one eye closed Additional Comments: Pt reports when she looks toward the R; she sees 4 of the same object split horizontally. Diplopia seems to start at midline and occurs in R visual field with  testing.    Perception     Praxis      Pertinent Vitals/Pain Pain Assessment:  No/denies pain     Hand Dominance Right   Extremity/Trunk Assessment Upper Extremity Assessment Upper Extremity Assessment: LUE deficits/detail LUE Deficits / Details: Grossly 4/5 with decreased grip strenght. AROM WFL. Pt reports numbness/tingling in forearm and hand. LUE Sensation: decreased light touch   Lower Extremity Assessment Lower Extremity Assessment: Defer to PT evaluation       Communication Communication Communication: No difficulties   Cognition Arousal/Alertness: Awake/alert Behavior During Therapy: WFL for tasks assessed/performed Overall Cognitive Status: Within Functional Limits for tasks assessed                     General Comments       Exercises       Shoulder Instructions      Home Living Family/patient expects to be discharged to:: Private residence Living Arrangements: Parent Available Help at Discharge: Family;Available 24 hours/day Type of Home: Mobile home Home Access: Stairs to enter Entrance Stairs-Number of Steps: 4 Entrance Stairs-Rails: Right;Left Home Layout: One level     Bathroom Shower/Tub: Tub/shower unit Shower/tub characteristics: Door Biochemist, clinical: Standard     Home Equipment: Grab bars - tub/shower   Additional Comments: Lives with mother and sister visits frequently      Prior Functioning/Environment Level of Independence: Independent        Comments: reports sister can drive pt to appointments if needed        OT Problem List: Decreased strength;Impaired balance (sitting and/or standing);Impaired vision/perception;Decreased knowledge of use of DME or AE;Decreased knowledge of precautions;Impaired sensation   OT Treatment/Interventions: Self-care/ADL training;Therapeutic exercise;Neuromuscular education;Energy conservation;DME and/or AE instruction;Therapeutic activities;Visual/perceptual remediation/compensation;Patient/family education;Balance training    OT Goals(Current goals can be found in  the care plan section) Acute Rehab OT Goals Patient Stated Goal: return home and return to being independent OT Goal Formulation: With patient Time For Goal Achievement: 04/28/16 Potential to Achieve Goals: Good ADL Goals Pt Will Transfer to Toilet: with modified independence;ambulating;regular height toilet Pt Will Perform Toileting - Clothing Manipulation and hygiene: with modified independence;sit to/from stand Pt Will Perform Tub/Shower Transfer: Tub transfer;with modified independence;ambulating;shower seat Pt/caregiver will Perform Home Exercise Program: Increased strength;Left upper extremity;With theraband;With written HEP provided Additional ADL Goal #1: Pt will independently correctly identify 5/5 objects in R visual field without cues.  OT Frequency: Min 3X/week   Barriers to D/C:            Co-evaluation              End of Session Nurse Communication: Mobility status  Activity Tolerance: Patient tolerated treatment well Patient left: in chair;with call bell/phone within reach;with chair alarm set   Time: [phone] OT Time Calculation (min): 19 min Charges:  OT General Charges $OT Visit: 1 Procedure OT Evaluation $OT Eval Moderate Complexity: 1 Procedure G-Codes: OT G-codes **NOT FOR INPATIENT CLASS** Functional Assessment Tool Used: Clinical judgement Functional Limitation: Self care Self Care Current Status [id number]): At least 1 percent but less than 20 percent impaired, limited or restricted Self Care Goal Status [id number]): At least 1 percent but less than 20 percent impaired, limited or restricted   Binnie Kand M.S., OTR/L Pager: [phone]  04/14/2016, 8:25 AM

## 2016-04-14 NOTE — Progress Notes (Signed)
*  PRELIMINARY RESULTS* Echocardiogram 2D Echocardiogram has been performed.  [name] 04/14/2016, 2:35 PM

## 2016-04-14 NOTE — Evaluation (Signed)
Physical Therapy Evaluation Patient Details Name: [name] MRN: [id number] DOB: 1946-02-14 Today's Date: 04/14/2016   History of Present Illness  70 y.o. female with medical history significant of antiphospholipid AB syndrome, chronic anticoagulation with coumadin, TIA, HLD, HTN.  Patient presents to the ED with c/o left sided numbness and blurred vision involving R eye. CT and MRI on 9/19 negative for acute infarct.  Clinical Impression  Pt with noted vision deficits that greatly contributing to her impaired balance requiring minA for safe OOB mobility. Aware there is no formal diagnosis at this time as MRI is negative for stroke. If pt regresses from mobility stand point, pt may need CIR upon d/c to achieve safe supervision level of function.    Follow Up Recommendations Outpatient PT;Supervision/Assistance - 24 hour    Equipment Recommendations  Rolling walker with 5" wheels (pt reports she has one)    Recommendations for Other Services       Precautions / Restrictions Precautions Precautions: Fall Precaution Comments: vision changes Restrictions Weight Bearing Restrictions: No      Mobility  Bed Mobility Overal bed mobility: Needs Assistance Bed Mobility: Supine to Sit;Sit to Supine     Supine to sit: Supervision;HOB elevated Sit to supine: Supervision   General bed mobility comments: increased time, use of hand rail  Transfers Overall transfer level: Needs assistance Equipment used: Rolling walker (2 wheeled) Transfers: Sit to/from Stand Sit to Stand: Min guard         General transfer comment: pt mildly unsteady due to vision impairment. vc's for safe hand placement  Ambulation/Gait Ambulation/Gait assistance: Min assist Ambulation Distance (Feet): 80 Feet Assistive device: Rolling walker (2 wheeled) Gait Pattern/deviations: Step-through pattern;Decreased stride length;Narrow base of support;Staggering left;Staggering right Gait velocity:  slow Gait velocity interpretation: Below normal speed for age/gender General Gait Details: pt with noted vision deficits requiring minA to maintain balance, navigate in hallway and for walker management. Pt with stagger L/R  Stairs            Wheelchair Mobility    Modified Rankin (Stroke Patients Only)       Balance Overall balance assessment: Needs assistance Sitting-balance support: Feet supported;No upper extremity supported Sitting balance-Leahy Scale: Good     Standing balance support: No upper extremity supported Standing balance-Leahy Scale: Fair                               Pertinent Vitals/Pain Pain Assessment: No/denies pain    Home Living Family/patient expects to be discharged to:: Private residence Living Arrangements: Parent Available Help at Discharge: Family;Available 24 hours/day Type of Home: Mobile home Home Access: Stairs to enter Entrance Stairs-Rails: Psychiatric nurse of Steps: 4 Home Layout: One level Home Equipment: Grab bars - tub/shower Additional Comments: Lives with mother and sister visits frequently    Prior Function Level of Independence: Independent         Comments: reports sister can drive pt to appointments if needed     Hand Dominance   Dominant Hand: Right    Extremity/Trunk Assessment   Upper Extremity Assessment: Defer to OT evaluation           Lower Extremity Assessment: LLE deficits/detail   LLE Deficits / Details: grossly 4-/5, 50% light touch deficit  Cervical / Trunk Assessment: Normal  Communication   Communication: No difficulties  Cognition Arousal/Alertness: Awake/alert Behavior During Therapy: WFL for tasks assessed/performed Overall Cognitive Status: Within Functional Limits  for tasks assessed                      General Comments General comments (skin integrity, edema, etc.): vision deficit: pt seeing quadruple out of R eye    Exercises      Assessment/Plan    PT Assessment Patient needs continued PT services  PT Problem List Decreased strength;Decreased range of motion;Decreased activity tolerance;Decreased balance;Decreased mobility;Decreased coordination;Decreased cognition          PT Treatment Interventions DME instruction;Gait training;Stair training;Functional mobility training;Therapeutic activities;Therapeutic exercise;Balance training    PT Goals (Current goals can be found in the Care Plan section)  Acute Rehab PT Goals Patient Stated Goal: return home and return to being independent PT Goal Formulation: With patient Time For Goal Achievement: 04/21/16 Potential to Achieve Goals: Good    Frequency Min 4X/week   Barriers to discharge Decreased caregiver support cares for mother, has a sister who is undergoing chemo, but also has another sister    Co-evaluation               End of Session Equipment Utilized During Treatment: Gait belt Activity Tolerance: Patient tolerated treatment well Patient left: in bed;with call bell/phone within reach;with bed alarm set Nurse Communication: Mobility status    Functional Assessment Tool Used: clinical judgement Functional Limitation: Mobility: Walking and moving around Mobility: Walking and Moving Around Current Status [id number]): At least 40 percent but less than 60 percent impaired, limited or restricted Mobility: Walking and Moving Around Goal Status [id number]): At least 1 percent but less than 20 percent impaired, limited or restricted    Time: 0931-0951 PT Time Calculation (min) (ACUTE ONLY): 20 min   Charges:   PT Evaluation $PT Eval Moderate Complexity: 1 Procedure     PT G Codes:   PT G-Codes **NOT FOR INPATIENT CLASS** Functional Assessment Tool Used: clinical judgement Functional Limitation: Mobility: Walking and moving around Mobility: Walking and Moving Around Current Status [id number]): At least 40 percent but less than 60 percent impaired,  limited or restricted Mobility: Walking and Moving Around Goal Status [phone]): At least 1 percent but less than 20 percent impaired, limited or restricted    Kingsley Callander 04/14/2016, 12:17 PM   Kittie Plater, PT, DPT Pager #: [phone] Office #: [phone]

## 2016-04-14 NOTE — Progress Notes (Signed)
Greg from pharmacy called stating Heparin lab low; RN relayed that heparin was started when patient admitted to floor and was paused for 1.5 hours as IV infiltrated and needed new IV by IV team with ultrasound.  No adjustments made to IV heparin at this time. Will continue to monitor.

## 2016-04-14 NOTE — Progress Notes (Signed)
[facility] for Heparin Indication: antiphospholipid AB syndrome  Allergies  Allergen Reactions  . Bee Venom Anaphylaxis  . Hornet Venom Anaphylaxis    Anaphylaxis Shock SHOCK  . Reglan [Metoclopramide] Shortness Of Breath and Other (See Comments)    Tongue got numb; didn't feel good  . Vitamin K Anaphylaxis and Rash    "kills me" iv  . Vitamin K And Related Anaphylaxis  . Benadryl [Diphenhydramine Hcl]     Rash  . Crestor [Rosuvastatin Calcium]     Muscle Aches  . Doxycycline     Tongue burning, thrush  . Levofloxacin     Rash  . Zocor [Simvastatin]     Muscle Aches  . Ciprofloxacin Rash  . Daypro [Oxaprozin] Rash    Rash  . Levofloxacin Rash  . Phytonadione Rash  . Sulfonamide Derivatives Rash    Patient Measurements: Height: 5' 4.96" (165 cm) Weight: 180 lb (81.6 kg) IBW/kg (Calculated) : 56.91 Heparin Dosing Weight: 74  Vital Signs: Temp: 97.5 F (36.4 C) (09/20 0537) Temp Source: Oral (09/20 0537) BP: 118/75 (09/20 0537) Pulse Rate: 67 (09/20 0537)  Labs:  Recent Labs  04/13/16 1601 04/13/16 1616 04/14/16 0525  HGB  --  14.1 13.8  HCT  --  43.5 43.3  PLT  --  265 276  APTT  --  32  --   LABPROT  --  17.2*  --   INR 1.6 1.39  --   HEPARINUNFRC  --   --  0.14*  CREATININE  --  1.42*  --     Estimated Creatinine Clearance: 40 mL/min (by C-G formula based on SCr of 1.42 mg/dL (H)).  Assessment: 70 y.o. female with antiphospholipid antibody syndrome, Coumadin on on hold for heparin. Heparin off for new IV from ~ 0400-0530   Goal of Therapy:  Heparin level 0.3-0.7 units/ml Monitor platelets by anticoagulation protocol: Yes   Plan:  Continue Heparin at current rate  Check heparin level in 8 hours.  Phillis Knack, PharmD, BCPS

## 2016-04-14 NOTE — Progress Notes (Signed)
[facility] for Heparin Indication: antiphospholipid AB syndrome  Allergies  Allergen Reactions  . Bee Venom Anaphylaxis  . Hornet Venom Anaphylaxis    Anaphylaxis Shock SHOCK  . Reglan [Metoclopramide] Shortness Of Breath and Other (See Comments)    Tongue got numb; didn't feel good  . Vitamin K Anaphylaxis and Rash    "kills me" iv  . Vitamin K And Related Anaphylaxis  . Benadryl [Diphenhydramine Hcl]     Rash  . Crestor [Rosuvastatin Calcium]     Muscle Aches  . Doxycycline     Tongue burning, thrush  . Levofloxacin     Rash  . Zocor [Simvastatin]     Muscle Aches  . Ciprofloxacin Rash  . Daypro [Oxaprozin] Rash    Rash  . Levofloxacin Rash  . Phytonadione Rash  . Sulfonamide Derivatives Rash    Patient Measurements: Height: 5' 4.96" (165 cm) Weight: 180 lb (81.6 kg) IBW/kg (Calculated) : 56.91 Heparin Dosing Weight: 74  Vital Signs: Temp: 98.1 F (36.7 C) (09/20 1045) Temp Source: Oral (09/20 1045) BP: 131/73 (09/20 1045) Pulse Rate: 68 (09/20 1045)  Labs:  Recent Labs  04/13/16 1601 04/13/16 1616 04/14/16 0525 04/14/16 1501  HGB  --  14.1 13.8  --   HCT  --  43.5 43.3  --   PLT  --  265 276  --   APTT  --  32  --   --   LABPROT  --  17.2*  --   --   INR 1.6 1.39  --   --   HEPARINUNFRC  --   --  0.14* 0.73*  CREATININE  --  1.42*  --   --     Estimated Creatinine Clearance: 40 mL/min (by C-G formula based on SCr of 1.42 mg/dL (H)).  Assessment: 70 y.o. female with antiphospholipid antibody syndrome, Coumadin on on hold for LP and pharmacy dosing heparin for bridge therapy. . Heparin off for new IV from ~ 0400-0530 which is the reason her HL was low at 0.14 at 0525 am.  Heparin level drawn ~ 9 hrs after IV restarted with heparin drip at 1100 units/hr is therapeutic at 0.73.  CBC stable, no bleeding reported.    Goal of Therapy:  Heparin level 0.3-0.7 units/ml Monitor platelets by anticoagulation protocol:  Yes   Plan: Continue heparin drip at 1100 units/hr Daily heparin level and CBC while on heparin   F/u for LP and when to resume coumadin  I ordered daily INR in case needed for LP  Eudelia Bunch, Pharm.D. [id number] 04/14/2016 4:06 PM

## 2016-04-14 NOTE — Care Management Obs Status (Signed)
[facility] NOTIFICATION   Patient Details  Name: LOLLIE LACEK MRN: [id number] Date of Birth: 12-21-1945   Medicare Observation Status Notification Given:  Yes    Pollie Friar, RN 04/14/2016, 5:09 PM

## 2016-04-14 NOTE — Progress Notes (Signed)
PROGRESS NOTE  MERYL SROCZYNSKI  [id number] DOB: 04/16/1946 DOA: 04/13/2016 PCP: [name]  Outpatient Specialists: None  Brief Narrative: [name] is a 70 y.o. 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 INR was subtherapeutic at 1.6. 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  No headache, no neck stiffness, no neck pain, no fever. Deficits have remained largely unchanged.   Assessment & Plan: Principal Problem:   Vision changes Active Problems:   Antiphospholipid antibody syndrome (HCC)   Chronic anticoagulation  TIA in setting of subtherapeutic coumadin therapy for APLA syndrome vs. meningitis as seen on MRI. - Per neurology - Due to possibility of neoplasm vs. inflammatory vs. infectious etiologies of radiological findings, would favor LP for r/o.  - On heparin gtt, holding coumadin for possible LP 9/21. - MRA of the neck showed Right ICA 50% stenosis.  - Echo pending - Telemetry - ASA 81mg  - PT/OT  HTN: Chronic.  - Resume metoprolol 12.5mg  BID  Hyperlipidemia: LDL 118 on pravastatin 20mg  - Increased pravastatin to 40mg   Antiphospholipid antibody syndrome: Chronic, subtherapeutic INR on admission.  - Heparin gtt as above  Hypothyroidism: Chronic, stable dose of synthroid.  - Continue synthroid - Check TSH (last in 2010)  Depression: Chronic, stable. No SI/HI - Continue home wellbutrin.  History of hypomagnesemia: On chronic magnesium. - Continue mag po, check Mag in AM  DVT prophylaxis: heparin gtt Code Status: Full Family Communication: Discussed with mother and 2  sisters. Disposition Plan: Pending neurology recommendations regarding LP.  Consultants:   Neurology, Dr. Leonie Man  Procedures:   Echo pending  Antimicrobials:  None   Subjective: Pt with ongoing horizontal right sided diplopia and abnormal sensations without weakness or trouble speaking or eating. Again reiterates no HA, neck stiffness, fever, N/V.   Objective: Vitals:   04/14/16 0842 04/14/16 1045 04/14/16 1438 04/14/16 1805  BP: 110/74 131/73 124/78 113/79  Pulse: 69 68 67 66  Resp: 18 18 20 20   Temp: 98.2 F (36.8 C) 98.1 F (36.7 C) 97.9 F (36.6 C) 97.7 F (36.5 C)  TempSrc: Oral Oral Oral Oral  SpO2: 98% 97% 98% 95%  Weight:      Height:        Intake/Output Summary (Last 24 hours) at 04/14/16 1905 Last data filed at 04/14/16 [id number]  Gross per 24 hour  Intake            397.4 ml  Output                0 ml  Net            397.4 ml   Filed Weights   04/13/16 2115  Weight: 81.6 kg (180 lb)    Examination: General exam: 70 y.o. female in no distress  Respiratory system: Non-labored breathing. Clear to auscultation bilaterally.  Cardiovascular system: Regular rate and rhythm. No murmur, rub, or gallop. No JVD, and no pedal edema. Gastrointestinal system: Abdomen soft, non-tender, non-distended, with normoactive bowel sounds. No organomegaly or masses felt. Central nervous system: Alert and oriented. No focal neurological deficits. Reported diplopia worst in right-side visual field. Extremities: Warm, no deformities Skin: No rashes, lesions no ulcers Psychiatry:  Judgement and insight appear normal. Mood & affect appropriate.   Data Reviewed: I have personally reviewed following labs and imaging studies  CBC:  Recent Labs Lab 04/13/16 1616 04/14/16 0525  WBC 9.3 10.3  NEUTROABS 6.2  --   HGB 14.1 13.8  HCT 43.5 43.3  MCV 97.3 97.7  PLT 265 [id number]   Basic Metabolic Panel:  Recent Labs Lab 04/13/16 1616  NA 138  K 4.2  CL 102  CO2 28  GLUCOSE  147*  BUN 17  CREATININE 1.42*  CALCIUM 9.7   GFR: Estimated Creatinine Clearance: 40 mL/min (by C-G formula based on SCr of 1.42 mg/dL (H)). Liver Function Tests:  Recent Labs Lab 04/13/16 1616  AST 20  ALT 15  ALKPHOS 99  BILITOT 0.8  PROT 7.3  ALBUMIN 4.2   No results for input(s): LIPASE, AMYLASE in the last 168 hours. No results for input(s): AMMONIA in the last 168 hours. Coagulation Profile:  Recent Labs Lab 04/13/16 1601 04/13/16 1616  INR 1.6 1.39   Cardiac Enzymes: No results for input(s): CKTOTAL, CKMB, CKMBINDEX, TROPONINI in the last 168 hours. BNP (last 3 results) No results for input(s): PROBNP in the last 8760 hours. HbA1C: No results for input(s): HGBA1C in the last 72 hours. CBG: No results for input(s): GLUCAP in the last 168 hours. Lipid Profile:  Recent Labs  04/14/16 0525  CHOL 204*  HDL 40*  LDLCALC 118*  TRIG 231*  CHOLHDL 5.1   Thyroid Function Tests: No results for input(s): TSH, T4TOTAL, FREET4, T3FREE, THYROIDAB in the last 72 hours. Anemia Panel: No results for input(s): VITAMINB12, FOLATE, FERRITIN, TIBC, IRON, RETICCTPCT in the last 72 hours. Urine analysis:    Component Value Date/Time   COLORURINE YELLOW 11/24/2008 0359   APPEARANCEUR CLEAR 11/24/2008 0359   LABSPEC 1.007 11/24/2008 0359   PHURINE 6.0 11/24/2008 0359   GLUCOSEU NEGATIVE 11/24/2008 0359   HGBUR MODERATE (A) 11/24/2008 0359   BILIRUBINUR NEGATIVE 11/24/2008 0359   KETONESUR NEGATIVE 11/24/2008 0359   PROTEINUR NEGATIVE 11/24/2008 0359   UROBILINOGEN 0.2 11/24/2008 0359   NITRITE NEGATIVE 11/24/2008 0359   LEUKOCYTESUR NEGATIVE 11/24/2008 0359   Sepsis Labs: @LABRCNTIP (procalcitonin:4,lacticidven:4)  )No results found for this or any previous visit (from the past 240 hour(s)).   Radiology Studies: Ct Head Wo Contrast  Result Date: 04/13/2016 CLINICAL DATA:  Pt states triple vision on right side, and numbness on left side from knee to toes and  elbow to finger tips x1 day. Pt also has numbness in left side of face. EXAM: CT HEAD WITHOUT CONTRAST TECHNIQUE: Contiguous axial images were obtained from the base of the skull through the vertex without intravenous contrast. COMPARISON:  Brain MRI and head CT, 02/17/2016 FINDINGS: Brain: No evidence of acute infarction, hemorrhage, hydrocephalus, extra-axial collection or mass lesion/mass effect. Vascular: No hyperdense vessel or unexpected calcification. Skull: Normal. Negative for fracture or focal lesion. Sinuses/Orbits: Minor right maxillary sinus and left sphenoid sinus mucosal thickening. Visualized sinuses otherwise clear. Normal globes and orbits. Other: Clear mastoid air cells. IMPRESSION: 1. No intracranial abnormality. Age related volume loss stable from the recent prior studies. 2. Sinus mucosal thickening, now only minor, improved from the recent prior exams. Electronically Signed   By: Lajean Manes M.D.   On: 04/13/2016 16:38   Mr Angiogram Head Wo Contrast  Result Date: 04/13/2016 CLINICAL DATA:  LEFT-sided numbness, vision changes beginning yesterday. History of anti phospholipid antibody syndrome,subtherapeutic on warfarin. History of breast cancer. EXAM: MRI HEAD WITH  AND WITHOUT CONTRAST MRA HEAD WITHOUT CONTRAST MRA NECK WITHOUT AND WITH CONTRAST TECHNIQUE: Multiplanar, multiecho pulse sequences of the brain and surrounding structures were obtained with and without intravenous contrast. Angiographic images of the Circle of Willis were obtained using MRA technique without intravenous contrast. Angiographic images of the neck were obtained using MRA technique without and with intravenous contrast. Carotid stenosis measurements (when applicable) are obtained utilizing NASCET criteria, using the distal internal carotid diameter as the denominator. CONTRAST:  18 cc MultiHance COMPARISON:  CT HEAD April 13, 2016 at 1625 hours and MRI and MRA of the head February 17, 2016 FINDINGS: MRI HEAD  FINDINGS BRAIN: No reduced diffusion to suggest acute ischemia. No susceptibility artifact to suggest hemorrhage. The ventricles and sulci are normal for patient's age. At least 10 sub cm enhancing foci in the cerebellum, brainstem. Sub cm enhancing lesions in the RIGHT greater than LEFT basal ganglia, corona radiata and centrum semiovale. Corresponding mild T2 hyperintense signal. Patchy supratentorial white matter FLAIR T2 hyperintensities exclusive of the aforementioned abnormality, relatively unchanged. No midline shift or mass effect. No abnormal extra-axial fluid collections,abnormal extra-axial enhancement or masses. VASCULAR: Normal major intracranial vascular flow voids present at skull base. SKULL AND UPPER CERVICAL SPINE: No abnormal sellar expansion. No suspicious calvarial bone marrow signal. Craniocervical junction maintained. SINUSES/ORBITS: The mastoid air-cells and included paranasal sinuses are well-aerated. The included ocular globes and orbital contents are non-suspicious. OTHER:  None. MRA HEAD FINDINGS ANTERIOR CIRCULATION: Normal flow related enhancement of the included cervical, petrous, cavernous and supraclinoid internal carotid arteries. Patent anterior communicating artery. Normal flow related enhancement of the anterior and middle cerebral arteries,including distal segments.No large vessel occlusion, high-grade stenosis, abnormal luminal irregularity, aneurysm. POSTERIOR CIRCULATION: RIGHT vertebral artery is dominant. LEFT vertebral artery terminates in the posterior inferior cerebellar artery. Artery is patent, with normal flow related enhancement of the main branch vessels. Robust bilateral posterior communicating arteries present. Normal flow related enhancement of the posterior cerebral arteries.No large vessel occlusion, high-grade stenosis, abnormal luminal irregularity, aneurysm. MRA NECK FINDINGS Source images and MIP image were reviewed. The common carotid arteries are widely  patent bilaterally. The carotid bifurcation is patent approximate 50% stenosis RIGHT internal carotid artery origin by NASCET criteria. Mild luminal irregularity of the bilateral vertebral arteries which remain patent throughout the course. IMPRESSION: MRI HEAD: Multiple subcentimeter foci of abnormal enhancement in the supra- and infratentorial brain predominately in a perivascular distribution most consistent with leptomeningitis. Though findings are often infectious or inflammatory, given given patient's history of breast cancer, neoplasm is a consideration. Recommend correlation with cerebral spinal fluid studies. MRA HEAD: Negative ; no typical findings of vasculopathy. MRA NECK: 50% stenosis RIGHT internal carotid artery origin by NASCET criteria.Mild vertebral artery regularity favoring atherosclerosis. Acute findings discussed with and reconfirmed by PA BOWIE TRAN on 04/13/2016 at 7: 40 pm. Electronically Signed   By: Elon Alas M.D.   On: 04/13/2016 19:53   Mr Angiogram Neck W Or Wo Contrast  Result Date: 04/13/2016 CLINICAL DATA:  LEFT-sided numbness, vision changes beginning yesterday. History of anti phospholipid antibody syndrome,subtherapeutic on warfarin. History of breast cancer. EXAM: MRI HEAD WITH AND WITHOUT CONTRAST MRA HEAD WITHOUT CONTRAST MRA NECK WITHOUT AND WITH CONTRAST TECHNIQUE: Multiplanar, multiecho pulse sequences of the brain and surrounding structures were obtained with and without intravenous contrast. Angiographic images of the Circle of Willis were obtained using MRA technique without intravenous contrast. Angiographic images of the neck were obtained using MRA technique without and with intravenous contrast. Carotid  stenosis measurements (when applicable) are obtained utilizing NASCET criteria, using the distal internal carotid diameter as the denominator. CONTRAST:  18 cc MultiHance COMPARISON:  CT HEAD April 13, 2016 at 1625 hours and MRI and MRA of the head February 17, 2016 FINDINGS: MRI HEAD FINDINGS BRAIN: No reduced diffusion to suggest acute ischemia. No susceptibility artifact to suggest hemorrhage. The ventricles and sulci are normal for patient's age. At least 10 sub cm enhancing foci in the cerebellum, brainstem. Sub cm enhancing lesions in the RIGHT greater than LEFT basal ganglia, corona radiata and centrum semiovale. Corresponding mild T2 hyperintense signal. Patchy supratentorial white matter FLAIR T2 hyperintensities exclusive of the aforementioned abnormality, relatively unchanged. No midline shift or mass effect. No abnormal extra-axial fluid collections,abnormal extra-axial enhancement or masses. VASCULAR: Normal major intracranial vascular flow voids present at skull base. SKULL AND UPPER CERVICAL SPINE: No abnormal sellar expansion. No suspicious calvarial bone marrow signal. Craniocervical junction maintained. SINUSES/ORBITS: The mastoid air-cells and included paranasal sinuses are well-aerated. The included ocular globes and orbital contents are non-suspicious. OTHER:  None. MRA HEAD FINDINGS ANTERIOR CIRCULATION: Normal flow related enhancement of the included cervical, petrous, cavernous and supraclinoid internal carotid arteries. Patent anterior communicating artery. Normal flow related enhancement of the anterior and middle cerebral arteries,including distal segments.No large vessel occlusion, high-grade stenosis, abnormal luminal irregularity, aneurysm. POSTERIOR CIRCULATION: RIGHT vertebral artery is dominant. LEFT vertebral artery terminates in the posterior inferior cerebellar artery. Artery is patent, with normal flow related enhancement of the main branch vessels. Robust bilateral posterior communicating arteries present. Normal flow related enhancement of the posterior cerebral arteries.No large vessel occlusion, high-grade stenosis, abnormal luminal irregularity, aneurysm. MRA NECK FINDINGS Source images and MIP image were reviewed. The common  carotid arteries are widely patent bilaterally. The carotid bifurcation is patent approximate 50% stenosis RIGHT internal carotid artery origin by NASCET criteria. Mild luminal irregularity of the bilateral vertebral arteries which remain patent throughout the course. IMPRESSION: MRI HEAD: Multiple subcentimeter foci of abnormal enhancement in the supra- and infratentorial brain predominately in a perivascular distribution most consistent with leptomeningitis. Though findings are often infectious or inflammatory, given given patient's history of breast cancer, neoplasm is a consideration. Recommend correlation with cerebral spinal fluid studies. MRA HEAD: Negative ; no typical findings of vasculopathy. MRA NECK: 50% stenosis RIGHT internal carotid artery origin by NASCET criteria.Mild vertebral artery regularity favoring atherosclerosis. Acute findings discussed with and reconfirmed by PA BOWIE TRAN on 04/13/2016 at 7: 40 pm. Electronically Signed   By: Elon Alas M.D.   On: 04/13/2016 19:53   Mr Jeri Cos [id number] Contrast  Result Date: 04/13/2016 CLINICAL DATA:  LEFT-sided numbness, vision changes beginning yesterday. History of anti phospholipid antibody syndrome, subtherapeutic on warfarin. History of breast cancer. EXAM: MRI HEAD WITH AND WITHOUT CONTRAST MRA HEAD WITHOUT CONTRAST MRA NECK WITHOUT AND WITH CONTRAST TECHNIQUE: Multiplanar, multiecho pulse sequences of the brain and surrounding structures were obtained with and without intravenous contrast. Angiographic images of the Circle of Willis were obtained using MRA technique without intravenous contrast. Angiographic images of the neck were obtained using MRA technique without and with intravenous contrast. Carotid stenosis measurements (when applicable) are obtained utilizing NASCET criteria, using the distal internal carotid diameter as the denominator. CONTRAST:  18 cc MultiHance COMPARISON:  CT HEAD April 13, 2016 at 1625 hours and MRI and MRA  of the head February 17, 2016 FINDINGS: MRI HEAD FINDINGS BRAIN: No reduced diffusion to suggest acute ischemia. No susceptibility artifact to suggest hemorrhage. The  ventricles and sulci are normal for patient's age. At least 10 sub cm enhancing foci in the cerebellum, brainstem. Sub cm enhancing lesions in the RIGHT greater than LEFT basal ganglia, corona radiata and centrum semiovale. Corresponding mild T2 hyperintense signal. Patchy supratentorial white matter FLAIR T2 hyperintensities exclusive of the aforementioned abnormality, relatively unchanged. No midline shift or mass effect. No abnormal extra-axial fluid collections, abnormal extra-axial enhancement or masses. VASCULAR: Normal major intracranial vascular flow voids present at skull base. SKULL AND UPPER CERVICAL SPINE: No abnormal sellar expansion. No suspicious calvarial bone marrow signal. Craniocervical junction maintained. SINUSES/ORBITS: The mastoid air-cells and included paranasal sinuses are well-aerated. The included ocular globes and orbital contents are non-suspicious. OTHER: None. MRA HEAD FINDINGS ANTERIOR CIRCULATION: Normal flow related enhancement of the included cervical, petrous, cavernous and supraclinoid internal carotid arteries. Patent anterior communicating artery. Normal flow related enhancement of the anterior and middle cerebral arteries, including distal segments. No large vessel occlusion, high-grade stenosis, abnormal luminal irregularity, aneurysm. POSTERIOR CIRCULATION: RIGHT vertebral artery is dominant. LEFT vertebral artery terminates in the posterior inferior cerebellar artery. Artery is patent, with normal flow related enhancement of the main branch vessels. Robust bilateral posterior communicating arteries present. Normal flow related enhancement of the posterior cerebral arteries. No large vessel occlusion, high-grade stenosis, abnormal luminal irregularity, aneurysm. MRA NECK FINDINGS Source images and MIP image were  reviewed. The common carotid arteries are widely patent bilaterally. The carotid bifurcation is patent approximate 50% stenosis RIGHT internal carotid artery origin by NASCET criteria. Mild luminal irregularity of the bilateral vertebral arteries which remain patent throughout the course. IMPRESSION: MRI HEAD: Multiple subcentimeter foci of abnormal enhancement in the supra- and infratentorial brain predominately in a perivascular distribution most consistent with leptomeningitis. Though findings are often infectious or inflammatory, given given patient's history of breast cancer, neoplasm is a consideration. Recommend correlation with cerebral spinal fluid studies. MRA HEAD: Negative ; no typical findings of vasculopathy. MRA NECK: 50% stenosis RIGHT internal carotid artery origin by NASCET criteria. Mild vertebral artery regularity favoring atherosclerosis. Acute findings discussed with and reconfirmed by PA BOWIE TRAN on 04/13/2016 at 7:40 pm. Electronically Signed   By: Elon Alas M.D.   On: 04/13/2016 19:44    Scheduled Meds: . aspirin EC  81 mg Oral q1800  . buPROPion  200 mg Oral BID WC  . HYDROcodone-acetaminophen  1 tablet Oral QHS  . levothyroxine  112 mcg Oral QAC breakfast  . magnesium oxide  400 mg Oral Daily  . metoprolol succinate  12.5 mg Oral BID  . pantoprazole  40 mg Oral Daily  . pravastatin  20 mg Oral Daily  . rOPINIRole  1 mg Oral BID   Continuous Infusions: . heparin 1,100 Units/hr (04/14/16 0537)     LOS: 0 days   Time spent: 25 minutes.  Vance Gather, MD [facility] Pager [phone]  If 7PM-7AM, please contact night-coverage [email or web address] Password [email or web address] 04/14/2016, 7:05 PM

## 2016-04-14 NOTE — Progress Notes (Signed)
STROKE TEAM PROGRESS NOTE   HISTORY OF PRESENT ILLNESS (per record) [name] is an 70 y.o. female with a history of hypertension, hyperlipidemia, hypothyroidism, stroke, TIA, ETT, antiphospholipid antibody syndrome, chronic anticoagulation with Coumadin, fibromyalgia, depression and anxiety, presenting with complaint of left sided numbness as well as triple vision involving her right eye. Symptoms were first noticed yesterday morning. She's had no symptoms of this nature previously. She is not been experiencing a headache. Change in speech has been noted. She's had no facial droop. INR today was 1.6. CT scan of her head showed no acute intracranial abnormality. MRI of her brain showed no signs of an acute stroke. 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 Patient has not been experiencing headache nor neck pain. She has been afebrile.  LSN: 04/12/2016 tPA Given: No: No clear objective focal deficit mRankin:   SUBJECTIVE (INTERVAL HISTORY) No family members present. The patient states she has been on warfarin as well as aspirin. She has a history of migraine headaches and TIAs and is on warfarin for h/o antiphospholipid syndrome. She has been experiencing double vision.   OBJECTIVE Temp:  [97.5 F (36.4 C)-98.2 F (36.8 C)] 97.7 F (36.5 C) (09/20 0748) Pulse Rate:  [67-86] 69 (09/20 0748) Cardiac Rhythm: Normal sinus rhythm (09/19 2338) Resp:  [16-18] 16 (09/20 0748) BP: (102-143)/(75-96) 102/79 (09/20 0748) SpO2:  [96 %-100 %] 97 % (09/20 0748) Weight:  [81.6 kg (180 lb)] 81.6 kg (180 lb) (09/19 2115)  CBC:  Recent Labs Lab 04/13/16 1616 04/14/16 0525  WBC 9.3 10.3  NEUTROABS 6.2  --   HGB 14.1 13.8  HCT 43.5 43.3  MCV 97.3 97.7  PLT 265 [id number]    Basic Metabolic Panel:  Recent Labs Lab 04/13/16 1616  NA 138  K 4.2   CL 102  CO2 28  GLUCOSE 147*  BUN 17  CREATININE 1.42*  CALCIUM 9.7    Lipid Panel:    Component Value Date/Time   CHOL 204 (H) 04/14/2016 0525   TRIG 231 (H) 04/14/2016 0525   HDL 40 (L) 04/14/2016 0525   CHOLHDL 5.1 04/14/2016 0525   VLDL 46 (H) 04/14/2016 0525   LDLCALC 118 (H) 04/14/2016 0525   HgbA1c: No results found for: HGBA1C Urine Drug Screen: No results found for: LABOPIA, COCAINSCRNUR, LABBENZ, AMPHETMU, THCU, LABBARB    IMAGING  Ct Head Wo Contrast 04/13/2016 1. No intracranial abnormality. Age related volume loss stable from the recent prior studies.  2. Sinus mucosal thickening, now only minor, improved from the recent prior exams.    Mr Angiogram Head Wo Contrast 04/13/2016  MRI HEAD:  Multiple subcentimeter foci of abnormal enhancement in the supra- and infratentorial brain predominately in a perivascular distribution most consistent with leptomeningitis. Though findings are often infectious or inflammatory, given given patient's history of breast cancer, neoplasm is a consideration. Recommend correlation with cerebral spinal fluid studies.   MRA HEAD:  Negative ; no typical findings of vasculopathy.    MRA NECK:  50% stenosis RIGHT internal carotid artery origin by NASCET criteria.Mild vertebral artery regularity favoring atherosclerosis.        PHYSICAL EXAM Pleasant middle aged lady not in distress. . Afebrile. Head is nontraumatic. Neck is supple without bruit.    Cardiac exam no murmur or gallop. Lungs are clear to auscultation. Distal pulses are well felt.No neck stiffness    Neurological Exam ;  Awake  Alert oriented x 3. Normal speech and language.eye movements full without nystagmus.fundi were not visualized. Vision acuity and fields appear normal.  Subjective diplopia on right lateral gaze but no opthalmoplegia.Hearing is normal. Palatal movements are normal. Face symmetric. Tongue midline. Normal strength, tone, reflexes and  coordination.subjective left hemibody paresthesias.sensation. Gait deferred.      ASSESSMENT/PLAN [name] is a 70 y.o. female with history of upper extremity thrombosis in 2013, previous stroke, neuromuscular disorder, hypertension, hyperlipidemia, previous TIAs, history of a DVT, history of antiphospholipid antibody syndrome, history of migraine headaches, fibromyalgia, depression, chronic anticoagulation on Coumadin, asthma, and anxiety with severe panic attacks presenting with left-sided numbness, diplopia, and speech difficulties. She did not receive IV t-PA due to Coumadin therapy.  Stroke:  Non-dominant infarcts felt to be embolic secondary to coagulopathy with a subtherapeutic INR.  Resultant  diplopia and left hemibody  MRI - Multiple subcentimeter foci of abnormal enhancement in the supra- and infratentorial brain predominately in a perivascular distribution most consistent with leptomeningitis.   MRA - 50 percent stenosis of the right internal carotid artery  Carotid Doppler - refer to MRA of the neck  2D Echo - pending  LDL- 118  HgbA1c pending  VTE prophylaxis - IV heparin  Diet Heart Room service appropriate? Yes; Fluid consistency: Thin  aspirin 81 mg daily and warfarin daily prior to admission, now on aspirin 81 mg daily and heparin IV  Patient counseled to be compliant with her antithrombotic medications  Ongoing aggressive stroke risk factor management  Therapy recommendations: Outpatient PT and OT recommend  Disposition: Pending  Hypertension  Blood pressure tends to run low  Permissive hypertension (OK if < 220/120) but gradually normalize in 5-7 days  Long-term BP goal normotensive  Hyperlipidemia  Home meds:  Pravachol 20 mg daily resumed in hospital  LDL 118, goal < 70  Increase Pravachol to 40 mg daily  Continue statin at discharge    Other Stroke Risk Factors  Advanced age  Obesity, Body mass index is 29.99 kg/m.,  recommend weight loss, diet and exercise as appropriate   Hx stroke/TIA  Antiphospholipid antibody syndrome   Other Active Problems  Antiphospholipid antibody syndrome  Hospital day # 0  Mikey Bussing PA-C [facility] Pager [phone] 04/14/2016, 2:44 PM I have personally examined this patient, reviewed notes, independently viewed imaging studies, participated in medical decision making and plan of care.ROS completed by me personally and pertinent positives fully documented  I have made any additions or clarifications directly to the above note. Agree with note above. She presented with the episode of diplopia and left hemibody numbness possibly a brainstem TIA in the setting of normal antiphospholipid antibody syndrome. MRI scan shows abnormal nodular leptomeningeal enhancement raising possibility of meningitis. Recommend spinal tap and send fluid for inflammatory as well as fragments markers. Discussed with Dr. Gari Crown neuro hospitalist team which was seen to patient tomorrow and take over her care. Discussed with Dr. Jacquenette Shone and answered questions. Greater than 50% time during this 35 minute visit was spent on counseling and coordination of care about her TIA and abnormal MRI findings  Antony Contras, [place] Pager: [phone] 04/14/2016 4:05 PM     To contact Stroke Continuity provider, please refer to [email or web address] After hours, contact General Neurology

## 2016-04-15 ENCOUNTER — Observation Stay (HOSPITAL_COMMUNITY): Payer: Medicare HMO

## 2016-04-15 DIAGNOSIS — Z7901 Long term (current) use of anticoagulants: Secondary | ICD-10-CM | POA: Diagnosis not present

## 2016-04-15 DIAGNOSIS — R9402 Abnormal brain scan: Secondary | ICD-10-CM

## 2016-04-15 DIAGNOSIS — Z853 Personal history of malignant neoplasm of breast: Secondary | ICD-10-CM | POA: Diagnosis not present

## 2016-04-15 DIAGNOSIS — M797 Fibromyalgia: Secondary | ICD-10-CM | POA: Diagnosis present

## 2016-04-15 DIAGNOSIS — Z7982 Long term (current) use of aspirin: Secondary | ICD-10-CM | POA: Diagnosis not present

## 2016-04-15 DIAGNOSIS — F329 Major depressive disorder, single episode, unspecified: Secondary | ICD-10-CM | POA: Diagnosis present

## 2016-04-15 DIAGNOSIS — M79605 Pain in left leg: Secondary | ICD-10-CM | POA: Diagnosis not present

## 2016-04-15 DIAGNOSIS — I10 Essential (primary) hypertension: Secondary | ICD-10-CM | POA: Diagnosis present

## 2016-04-15 DIAGNOSIS — E038 Other specified hypothyroidism: Secondary | ICD-10-CM | POA: Diagnosis not present

## 2016-04-15 DIAGNOSIS — M79609 Pain in unspecified limb: Secondary | ICD-10-CM | POA: Diagnosis not present

## 2016-04-15 DIAGNOSIS — Z86718 Personal history of other venous thrombosis and embolism: Secondary | ICD-10-CM | POA: Diagnosis not present

## 2016-04-15 DIAGNOSIS — F419 Anxiety disorder, unspecified: Secondary | ICD-10-CM | POA: Diagnosis present

## 2016-04-15 DIAGNOSIS — D6861 Antiphospholipid syndrome: Secondary | ICD-10-CM | POA: Diagnosis present

## 2016-04-15 DIAGNOSIS — Z79899 Other long term (current) drug therapy: Secondary | ICD-10-CM | POA: Diagnosis not present

## 2016-04-15 DIAGNOSIS — Z8673 Personal history of transient ischemic attack (TIA), and cerebral infarction without residual deficits: Secondary | ICD-10-CM | POA: Diagnosis not present

## 2016-04-15 DIAGNOSIS — E785 Hyperlipidemia, unspecified: Secondary | ICD-10-CM | POA: Diagnosis present

## 2016-04-15 DIAGNOSIS — R208 Other disturbances of skin sensation: Secondary | ICD-10-CM | POA: Diagnosis present

## 2016-04-15 DIAGNOSIS — A819 Atypical virus infection of central nervous system, unspecified: Secondary | ICD-10-CM | POA: Diagnosis not present

## 2016-04-15 DIAGNOSIS — E890 Postprocedural hypothyroidism: Secondary | ICD-10-CM | POA: Diagnosis present

## 2016-04-15 DIAGNOSIS — G030 Nonpyogenic meningitis: Secondary | ICD-10-CM | POA: Diagnosis not present

## 2016-04-15 DIAGNOSIS — K219 Gastro-esophageal reflux disease without esophagitis: Secondary | ICD-10-CM | POA: Diagnosis present

## 2016-04-15 DIAGNOSIS — H4921 Sixth [abducent] nerve palsy, right eye: Secondary | ICD-10-CM | POA: Diagnosis present

## 2016-04-15 DIAGNOSIS — H539 Unspecified visual disturbance: Secondary | ICD-10-CM | POA: Diagnosis not present

## 2016-04-15 DIAGNOSIS — G039 Meningitis, unspecified: Secondary | ICD-10-CM | POA: Diagnosis present

## 2016-04-15 DIAGNOSIS — M1990 Unspecified osteoarthritis, unspecified site: Secondary | ICD-10-CM | POA: Diagnosis present

## 2016-04-15 LAB — CBC
HCT: 42.9 % (ref 36.0–46.0)
Hemoglobin: 13.9 g/dL (ref 12.0–15.0)
MCH: 31.4 pg (ref 26.0–34.0)
MCHC: 32.4 g/dL (ref 30.0–36.0)
MCV: 97.1 fL (ref 78.0–100.0)
Platelets: 245 10*3/uL (ref 150–400)
RBC: 4.42 MIL/uL (ref 3.87–5.11)
RDW: 14 % (ref 11.5–15.5)
WBC: 10.2 10*3/uL (ref 4.0–10.5)

## 2016-04-15 LAB — BASIC METABOLIC PANEL
Anion gap: 11 (ref 5–15)
BUN: 16 mg/dL (ref 6–20)
CO2: 24 mmol/L (ref 22–32)
Calcium: 9 mg/dL (ref 8.9–10.3)
Chloride: 101 mmol/L (ref 101–111)
Creatinine, Ser: 1.26 mg/dL — ABNORMAL HIGH (ref 0.44–1.00)
GFR calc Af Amer: 50 mL/min — ABNORMAL LOW (ref >60)
GFR calc non Af Amer: 43 mL/min — ABNORMAL LOW (ref >60)
Glucose, Bld: 116 mg/dL — ABNORMAL HIGH (ref 65–99)
Potassium: 4.3 mmol/L (ref 3.5–5.1)
Sodium: 136 mmol/L (ref 135–145)

## 2016-04-15 LAB — PROTIME-INR
INR: 1.25
Prothrombin Time: 15.7 seconds — ABNORMAL HIGH (ref 11.4–15.2)

## 2016-04-15 LAB — HEMOGLOBIN A1C
Hgb A1c MFr Bld: 6 % — ABNORMAL HIGH (ref 4.8–5.6)
Mean Plasma Glucose: 126 mg/dL

## 2016-04-15 LAB — HEPARIN LEVEL (UNFRACTIONATED)
Heparin Unfractionated: 1.09 IU/mL — ABNORMAL HIGH (ref 0.30–0.70)
Heparin Unfractionated: 0.55 IU/mL (ref 0.30–0.70)

## 2016-04-15 LAB — APTT: aPTT: 103 seconds — ABNORMAL HIGH (ref 24–36)

## 2016-04-15 LAB — TSH: TSH: 45.129 u[IU]/mL — ABNORMAL HIGH (ref 0.350–4.500)

## 2016-04-15 LAB — MAGNESIUM: Magnesium: 2.2 mg/dL (ref 1.7–2.4)

## 2016-04-15 MED ORDER — LEVOTHYROXINE SODIUM 25 MCG PO TABS
125.0000 ug | ORAL_TABLET | Freq: Every day | ORAL | Status: DC
  Administered 2016-04-16 – 2016-04-17 (×2): 125 ug via ORAL
  Filled 2016-04-15 (×2): qty 1

## 2016-04-15 MED ORDER — ACETAMINOPHEN 325 MG PO TABS
650.0000 mg | ORAL_TABLET | ORAL | Status: DC | PRN
  Administered 2016-04-15 – 2016-04-17 (×3): 650 mg via ORAL
  Filled 2016-04-15 (×3): qty 2

## 2016-04-15 MED ORDER — HEPARIN (PORCINE) IN NACL 100-0.45 UNIT/ML-% IJ SOLN
900.0000 [IU]/h | INTRAMUSCULAR | Status: DC
  Administered 2016-04-15: 900 [IU]/h via INTRAVENOUS
  Filled 2016-04-15: qty 250

## 2016-04-15 NOTE — Progress Notes (Signed)
Physical Therapy Treatment Patient Details Name: [name] MRN: [id number] DOB: [birth year] Today's Date: 04/15/2016    History of Present Illness 70 y.o. female with medical history significant of antiphospholipid AB syndrome, chronic anticoagulation with coumadin, TIA, HLD, HTN.  Patient presents to the ED with c/o left sided numbness and blurred vision involving R eye. CT and MRI on 9/19 negative for acute infarct.    PT Comments    Patient continues to demonstrate vision/balance impairments and required min A for ambulation. Pt tolerated increased gait distance. Continue to progress as tolerated.   Follow Up Recommendations  Outpatient PT;Supervision/Assistance - 24 hour     Equipment Recommendations  Rolling walker with 5" wheels (pt reports she has one)    Recommendations for Other Services       Precautions / Restrictions Precautions Precautions: Fall Precaution Comments: vision changes Restrictions Weight Bearing Restrictions: No    Mobility  Bed Mobility Overal bed mobility: Needs Assistance Bed Mobility: Supine to Sit     Supine to sit: Supervision     General bed mobility comments: increased time; supervision for safety  Transfers Overall transfer level: Needs assistance Equipment used: Rolling walker (2 wheeled) Transfers: Sit to/from Stand Sit to Stand: Min guard         General transfer comment: min guard for safety; no LOB but pt unsteady due to vision impairments  Ambulation/Gait Ambulation/Gait assistance: Min assist Ambulation Distance (Feet): 150 Feet Assistive device: 1 person hand held assist (hand rail in hallway) Gait Pattern/deviations: Step-through pattern;Decreased stride length;Decreased step length - right;Decreased step length - left;Narrow base of support Gait velocity: slow   General Gait Details: assist for balance as pt reported seeing quadruple of "everything" except in L peripheral vision; short, guraded steps due to  unsteadiness and dizziness; use hand rail in hallway most of time and HHA when needed   Stairs            Wheelchair Mobility    Modified Rankin (Stroke Patients Only)       Balance     Sitting balance-Leahy Scale: Good       Standing balance-Leahy Scale: Fair Standing balance comment: static standing                    Cognition Arousal/Alertness: Awake/alert Behavior During Therapy: WFL for tasks assessed/performed Overall Cognitive Status: Within Functional Limits for tasks assessed                      Exercises      General Comments        Pertinent Vitals/Pain Pain Assessment: No/denies pain    Home Living                      Prior Function            PT Goals (current goals can now be found in the care plan section) Acute Rehab PT Goals Patient Stated Goal: return to caring for mother PT Goal Formulation: With patient Time For Goal Achievement: 04/21/16 Potential to Achieve Goals: Good Progress towards PT goals: Progressing toward goals    Frequency    Min 4X/week      PT Plan Current plan remains appropriate    Co-evaluation             End of Session Equipment Utilized During Treatment: Gait belt Activity Tolerance: Patient tolerated treatment well Patient left: with call bell/phone within reach;in chair;with chair  alarm set;with family/visitor present     Time: 1511-1536 PT Time Calculation (min) (ACUTE ONLY): 25 min  Charges:  $Gait Training: 8-22 mins $Therapeutic Activity: 8-22 mins                    G Codes:      Salina April, PTA Pager: [phone]   04/15/2016, 3:44 PM

## 2016-04-15 NOTE — Progress Notes (Signed)
PROGRESS NOTE  VIDETTE WILKINSON  [id number] DOB: 12-12-1945 DOA: 04/13/2016 PCP: [name]  Outpatient Specialists: None  Brief Narrative: VENIDA DUNKLEY is a 70 y.o. 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 INR was subtherapeutic at 1.6. 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 Deficits have remained largely unchanged. She developed a headache on 9/21, plan is for LP 9/21.   Assessment & Plan: Principal Problem:   Vision changes Active Problems:   Antiphospholipid antibody syndrome (HCC)   Chronic anticoagulation  TIA in setting of subtherapeutic coumadin therapy for APLA syndrome vs. leptomeningitis due to neoplastic vs. inflammatory (MS, SLE) vs. infectious though to be least likely. - Per neurology - LP with Protein, White cells, glucose, Cytology, Lyme titer, CSF ACE, oligoclonal bands, along with a viral panel and culture.  - On heparin gtt, holding coumadin.  - MRA of the neck showed Right ICA 50% stenosis.  - Echo without cardioembolic source, carotid doppler 50% right ICA stenosis - Telemetry - ASA 81mg  - PT/OT: Recommend outpatient PT/OT, DME ordered.  Hypothyroidism: Chronic, stable dose of synthroid but seems subtherapeutic with TSH 45 and reported full compliance to regimen, has been on higher dose.  - Increase synthroid dose modestly to previous dose, 0.125mg .  - Follow up recheck in 4 - 6 weeks  Antiphospholipid antibody syndrome: Chronic, subtherapeutic INR on admission.  - Heparin gtt as above, INR has dropped to 1.25, will restart coumadin following LP.  HTN: Chronic.  -  Resume metoprolol 12.5mg  BID  Hyperlipidemia: LDL 118 on pravastatin 20mg  - Increased pravastatin to 40mg   Depression: Chronic, stable. No SI/HI - Continue home wellbutrin.  History of hypomagnesemia: On chronic magnesium. - Continue mag po, Mg wnl 9/21  DVT prophylaxis: heparin gtt Code Status: Full Family Communication: Discussed with son, Kasandra Knudsen by phone. Disposition Plan: Pending LP results.  Consultants:   Neurology, Dr. Leonie Man  Procedures:  Echo 9/19:  - Left ventricle: The cavity size was normal. Wall thickness was   increased in a pattern of mild LVH. Systolic function was normal.   The estimated ejection fraction was in the range of 50% to 55%.   Wall motion was normal; there were no regional wall motion   abnormalities. Doppler parameters are consistent with abnormal   left ventricular relaxation (grade 1 diastolic dysfunction). - Aortic valve: Mildly calcified annulus. Mildly thickened, mildly calcified leaflets. There was trivial regurgitation.  Antimicrobials:  None   Subjective: Pt with ongoing horizontal right sided diplopia unchanged from yesterday. Had severe HA overnight, no stiffness/fever/N/V.   Objective: Vitals:   04/14/16 2100 04/15/16 0029 04/15/16 0453 04/15/16 1007  BP: (!) 147/91 101/76 112/62 103/72  Pulse: 79 65 66 61  Resp: 18 20 20 20   Temp: 98.4 F (36.9 C) 98.2 F (36.8 C) 97.8 F (36.6 C) 97.9 F (36.6 C)  TempSrc: Oral Oral Oral Oral  SpO2: 96% 97% 98% 96%  Weight:      Height:        Intake/Output Summary (Last 24 hours) at 04/15/16 1309 Last data filed at 04/15/16 0600  Gross per 24 hour  Intake           268.22 ml  Output                0 ml  Net           268.22 ml   Filed Weights   04/13/16 2115  Weight: 81.6 kg (180 lb)    Examination: General exam: 70 y.o. female in no distress Neck: Soft, supple, no stiffness. Thyroid absent.  Respiratory system: Non-labored breathing. Clear to auscultation bilaterally.    Cardiovascular system: Regular rate and rhythm. No murmur, rub, or gallop. No JVD, and no pedal edema. Gastrointestinal system: Abdomen soft, non-tender, non-distended, with normoactive bowel sounds. No organomegaly or masses felt. Central nervous system: Alert and oriented. No focal sensorimotor deficits. Reported diplopia worst in right-side visual field. Extremities: Warm, no deformities Skin: No rashes, lesions no ulcers Psychiatry: Judgement and insight appear normal. Mood & affect appropriate.   Data Reviewed: I have personally reviewed following labs and imaging studies  CBC:  Recent Labs Lab 04/13/16 1616 04/14/16 0525 04/15/16 0620  WBC 9.3 10.3 10.2  NEUTROABS 6.2  --   --   HGB 14.1 13.8 13.9  HCT 43.5 43.3 42.9  MCV 97.3 97.7 97.1  PLT 265 276 [id number]   Basic Metabolic Panel:  Recent Labs Lab 04/13/16 1616 04/15/16 0620  NA 138 136  K 4.2 4.3  CL 102 101  CO2 28 24  GLUCOSE 147* 116*  BUN 17 16  CREATININE 1.42* 1.26*  CALCIUM 9.7 9.0  MG  --  2.2   GFR: Estimated Creatinine Clearance: 45.1 mL/min (by C-G formula based on SCr of 1.26 mg/dL (H)). Liver Function Tests:  Recent Labs Lab 04/13/16 1616  AST 20  ALT 15  ALKPHOS 99  BILITOT 0.8  PROT 7.3  ALBUMIN 4.2   No results for input(s): LIPASE, AMYLASE in the last 168 hours. No results for input(s): AMMONIA in the last 168 hours. Coagulation Profile:  Recent Labs Lab 04/13/16 1601 04/13/16 1616 04/15/16 0620  INR 1.6 1.39 1.25   Cardiac Enzymes: No results for input(s): CKTOTAL, CKMB, CKMBINDEX, TROPONINI in the last 168 hours. BNP (last 3 results) No results for input(s): PROBNP in the last 8760 hours. HbA1C:  Recent Labs  04/14/16 0525  HGBA1C 6.0*   CBG: No results for input(s): GLUCAP in the last 168 hours. Lipid Profile:  Recent Labs  04/14/16 0525  CHOL 204*  HDL 40*  LDLCALC 118*  TRIG 231*  CHOLHDL 5.1   Thyroid Function Tests:  Recent Labs  04/15/16 0620   TSH 45.129*   Anemia Panel: No results for input(s): VITAMINB12, FOLATE, FERRITIN, TIBC, IRON, RETICCTPCT in the last 72 hours. Urine analysis:    Component Value Date/Time   COLORURINE YELLOW 11/24/2008 0359   APPEARANCEUR CLEAR 11/24/2008 0359   LABSPEC 1.007 11/24/2008 0359   PHURINE 6.0 11/24/2008 0359   GLUCOSEU NEGATIVE 11/24/2008 0359   HGBUR MODERATE (A) 11/24/2008 0359   BILIRUBINUR NEGATIVE 11/24/2008 0359   KETONESUR NEGATIVE 11/24/2008 0359   PROTEINUR NEGATIVE 11/24/2008 0359   UROBILINOGEN 0.2 11/24/2008 0359   NITRITE NEGATIVE 11/24/2008 0359   LEUKOCYTESUR NEGATIVE 11/24/2008 0359   Sepsis Labs: @LABRCNTIP (procalcitonin:4,lacticidven:4)  )No results found for this or any previous visit (from the past 240 hour(s)).   Radiology Studies: Ct Head Wo Contrast  Result Date: 04/13/2016 CLINICAL DATA:  Pt states triple vision on right side, and numbness on left side from knee to toes and elbow to finger tips x1 day. Pt also has numbness in left side of face.  EXAM: CT HEAD WITHOUT CONTRAST TECHNIQUE: Contiguous axial images were obtained from the base of the skull through the vertex without intravenous contrast. COMPARISON:  Brain MRI and head CT, 02/17/2016 FINDINGS: Brain: No evidence of acute infarction, hemorrhage, hydrocephalus, extra-axial collection or mass lesion/mass effect. Vascular: No hyperdense vessel or unexpected calcification. Skull: Normal. Negative for fracture or focal lesion. Sinuses/Orbits: Minor right maxillary sinus and left sphenoid sinus mucosal thickening. Visualized sinuses otherwise clear. Normal globes and orbits. Other: Clear mastoid air cells. IMPRESSION: 1. No intracranial abnormality. Age related volume loss stable from the recent prior studies. 2. Sinus mucosal thickening, now only minor, improved from the recent prior exams. Electronically Signed   By: Lajean Manes M.D.   On: 04/13/2016 16:38   Mr Angiogram Head Wo Contrast  Result Date:  04/13/2016 CLINICAL DATA:  LEFT-sided numbness, vision changes beginning yesterday. History of anti phospholipid antibody syndrome,subtherapeutic on warfarin. History of breast cancer. EXAM: MRI HEAD WITH AND WITHOUT CONTRAST MRA HEAD WITHOUT CONTRAST MRA NECK WITHOUT AND WITH CONTRAST TECHNIQUE: Multiplanar, multiecho pulse sequences of the brain and surrounding structures were obtained with and without intravenous contrast. Angiographic images of the Circle of Willis were obtained using MRA technique without intravenous contrast. Angiographic images of the neck were obtained using MRA technique without and with intravenous contrast. Carotid stenosis measurements (when applicable) are obtained utilizing NASCET criteria, using the distal internal carotid diameter as the denominator. CONTRAST:  18 cc MultiHance COMPARISON:  CT HEAD April 13, 2016 at 1625 hours and MRI and MRA of the head February 17, 2016 FINDINGS: MRI HEAD FINDINGS BRAIN: No reduced diffusion to suggest acute ischemia. No susceptibility artifact to suggest hemorrhage. The ventricles and sulci are normal for patient's age. At least 10 sub cm enhancing foci in the cerebellum, brainstem. Sub cm enhancing lesions in the RIGHT greater than LEFT basal ganglia, corona radiata and centrum semiovale. Corresponding mild T2 hyperintense signal. Patchy supratentorial white matter FLAIR T2 hyperintensities exclusive of the aforementioned abnormality, relatively unchanged. No midline shift or mass effect. No abnormal extra-axial fluid collections,abnormal extra-axial enhancement or masses. VASCULAR: Normal major intracranial vascular flow voids present at skull base. SKULL AND UPPER CERVICAL SPINE: No abnormal sellar expansion. No suspicious calvarial bone marrow signal. Craniocervical junction maintained. SINUSES/ORBITS: The mastoid air-cells and included paranasal sinuses are well-aerated. The included ocular globes and orbital contents are non-suspicious.  OTHER:  None. MRA HEAD FINDINGS ANTERIOR CIRCULATION: Normal flow related enhancement of the included cervical, petrous, cavernous and supraclinoid internal carotid arteries. Patent anterior communicating artery. Normal flow related enhancement of the anterior and middle cerebral arteries,including distal segments.No large vessel occlusion, high-grade stenosis, abnormal luminal irregularity, aneurysm. POSTERIOR CIRCULATION: RIGHT vertebral artery is dominant. LEFT vertebral artery terminates in the posterior inferior cerebellar artery. Artery is patent, with normal flow related enhancement of the main branch vessels. Robust bilateral posterior communicating arteries present. Normal flow related enhancement of the posterior cerebral arteries.No large vessel occlusion, high-grade stenosis, abnormal luminal irregularity, aneurysm. MRA NECK FINDINGS Source images and MIP image were reviewed. The common carotid arteries are widely patent bilaterally. The carotid bifurcation is patent approximate 50% stenosis RIGHT internal carotid artery origin by NASCET criteria. Mild luminal irregularity of the bilateral vertebral arteries which remain patent throughout the course. IMPRESSION: MRI HEAD: Multiple subcentimeter foci of abnormal enhancement in the supra- and infratentorial brain predominately in a perivascular distribution most consistent with leptomeningitis. Though findings are often infectious or inflammatory, given given patient's history of breast cancer, neoplasm is a consideration.  Recommend correlation with cerebral spinal fluid studies. MRA HEAD: Negative ; no typical findings of vasculopathy. MRA NECK: 50% stenosis RIGHT internal carotid artery origin by NASCET criteria.Mild vertebral artery regularity favoring atherosclerosis. Acute findings discussed with and reconfirmed by PA BOWIE TRAN on 04/13/2016 at 7: 40 pm. Electronically Signed   By: Elon Alas M.D.   On: 04/13/2016 19:53   Mr Angiogram Neck W  Or Wo Contrast  Result Date: 04/13/2016 CLINICAL DATA:  LEFT-sided numbness, vision changes beginning yesterday. History of anti phospholipid antibody syndrome,subtherapeutic on warfarin. History of breast cancer. EXAM: MRI HEAD WITH AND WITHOUT CONTRAST MRA HEAD WITHOUT CONTRAST MRA NECK WITHOUT AND WITH CONTRAST TECHNIQUE: Multiplanar, multiecho pulse sequences of the brain and surrounding structures were obtained with and without intravenous contrast. Angiographic images of the Circle of Willis were obtained using MRA technique without intravenous contrast. Angiographic images of the neck were obtained using MRA technique without and with intravenous contrast. Carotid stenosis measurements (when applicable) are obtained utilizing NASCET criteria, using the distal internal carotid diameter as the denominator. CONTRAST:  18 cc MultiHance COMPARISON:  CT HEAD April 13, 2016 at 1625 hours and MRI and MRA of the head February 17, 2016 FINDINGS: MRI HEAD FINDINGS BRAIN: No reduced diffusion to suggest acute ischemia. No susceptibility artifact to suggest hemorrhage. The ventricles and sulci are normal for patient's age. At least 10 sub cm enhancing foci in the cerebellum, brainstem. Sub cm enhancing lesions in the RIGHT greater than LEFT basal ganglia, corona radiata and centrum semiovale. Corresponding mild T2 hyperintense signal. Patchy supratentorial white matter FLAIR T2 hyperintensities exclusive of the aforementioned abnormality, relatively unchanged. No midline shift or mass effect. No abnormal extra-axial fluid collections,abnormal extra-axial enhancement or masses. VASCULAR: Normal major intracranial vascular flow voids present at skull base. SKULL AND UPPER CERVICAL SPINE: No abnormal sellar expansion. No suspicious calvarial bone marrow signal. Craniocervical junction maintained. SINUSES/ORBITS: The mastoid air-cells and included paranasal sinuses are well-aerated. The included ocular globes and orbital  contents are non-suspicious. OTHER:  None. MRA HEAD FINDINGS ANTERIOR CIRCULATION: Normal flow related enhancement of the included cervical, petrous, cavernous and supraclinoid internal carotid arteries. Patent anterior communicating artery. Normal flow related enhancement of the anterior and middle cerebral arteries,including distal segments.No large vessel occlusion, high-grade stenosis, abnormal luminal irregularity, aneurysm. POSTERIOR CIRCULATION: RIGHT vertebral artery is dominant. LEFT vertebral artery terminates in the posterior inferior cerebellar artery. Artery is patent, with normal flow related enhancement of the main branch vessels. Robust bilateral posterior communicating arteries present. Normal flow related enhancement of the posterior cerebral arteries.No large vessel occlusion, high-grade stenosis, abnormal luminal irregularity, aneurysm. MRA NECK FINDINGS Source images and MIP image were reviewed. The common carotid arteries are widely patent bilaterally. The carotid bifurcation is patent approximate 50% stenosis RIGHT internal carotid artery origin by NASCET criteria. Mild luminal irregularity of the bilateral vertebral arteries which remain patent throughout the course. IMPRESSION: MRI HEAD: Multiple subcentimeter foci of abnormal enhancement in the supra- and infratentorial brain predominately in a perivascular distribution most consistent with leptomeningitis. Though findings are often infectious or inflammatory, given given patient's history of breast cancer, neoplasm is a consideration. Recommend correlation with cerebral spinal fluid studies. MRA HEAD: Negative ; no typical findings of vasculopathy. MRA NECK: 50% stenosis RIGHT internal carotid artery origin by NASCET criteria.Mild vertebral artery regularity favoring atherosclerosis. Acute findings discussed with and reconfirmed by PA BOWIE TRAN on 04/13/2016 at 7: 40 pm. Electronically Signed   By: Elon Alas M.D.   On: 04/13/2016  19:53   Mr Jeri Cos [id number] Contrast  Result Date: 04/13/2016 CLINICAL DATA:  LEFT-sided numbness, vision changes beginning yesterday. History of anti phospholipid antibody syndrome, subtherapeutic on warfarin. History of breast cancer. EXAM: MRI HEAD WITH AND WITHOUT CONTRAST MRA HEAD WITHOUT CONTRAST MRA NECK WITHOUT AND WITH CONTRAST TECHNIQUE: Multiplanar, multiecho pulse sequences of the brain and surrounding structures were obtained with and without intravenous contrast. Angiographic images of the Circle of Willis were obtained using MRA technique without intravenous contrast. Angiographic images of the neck were obtained using MRA technique without and with intravenous contrast. Carotid stenosis measurements (when applicable) are obtained utilizing NASCET criteria, using the distal internal carotid diameter as the denominator. CONTRAST:  18 cc MultiHance COMPARISON:  CT HEAD April 13, 2016 at 1625 hours and MRI and MRA of the head February 17, 2016 FINDINGS: MRI HEAD FINDINGS BRAIN: No reduced diffusion to suggest acute ischemia. No susceptibility artifact to suggest hemorrhage. The ventricles and sulci are normal for patient's age. At least 10 sub cm enhancing foci in the cerebellum, brainstem. Sub cm enhancing lesions in the RIGHT greater than LEFT basal ganglia, corona radiata and centrum semiovale. Corresponding mild T2 hyperintense signal. Patchy supratentorial white matter FLAIR T2 hyperintensities exclusive of the aforementioned abnormality, relatively unchanged. No midline shift or mass effect. No abnormal extra-axial fluid collections, abnormal extra-axial enhancement or masses. VASCULAR: Normal major intracranial vascular flow voids present at skull base. SKULL AND UPPER CERVICAL SPINE: No abnormal sellar expansion. No suspicious calvarial bone marrow signal. Craniocervical junction maintained. SINUSES/ORBITS: The mastoid air-cells and included paranasal sinuses are well-aerated. The included ocular  globes and orbital contents are non-suspicious. OTHER: None. MRA HEAD FINDINGS ANTERIOR CIRCULATION: Normal flow related enhancement of the included cervical, petrous, cavernous and supraclinoid internal carotid arteries. Patent anterior communicating artery. Normal flow related enhancement of the anterior and middle cerebral arteries, including distal segments. No large vessel occlusion, high-grade stenosis, abnormal luminal irregularity, aneurysm. POSTERIOR CIRCULATION: RIGHT vertebral artery is dominant. LEFT vertebral artery terminates in the posterior inferior cerebellar artery. Artery is patent, with normal flow related enhancement of the main branch vessels. Robust bilateral posterior communicating arteries present. Normal flow related enhancement of the posterior cerebral arteries. No large vessel occlusion, high-grade stenosis, abnormal luminal irregularity, aneurysm. MRA NECK FINDINGS Source images and MIP image were reviewed. The common carotid arteries are widely patent bilaterally. The carotid bifurcation is patent approximate 50% stenosis RIGHT internal carotid artery origin by NASCET criteria. Mild luminal irregularity of the bilateral vertebral arteries which remain patent throughout the course. IMPRESSION: MRI HEAD: Multiple subcentimeter foci of abnormal enhancement in the supra- and infratentorial brain predominately in a perivascular distribution most consistent with leptomeningitis. Though findings are often infectious or inflammatory, given given patient's history of breast cancer, neoplasm is a consideration. Recommend correlation with cerebral spinal fluid studies. MRA HEAD: Negative ; no typical findings of vasculopathy. MRA NECK: 50% stenosis RIGHT internal carotid artery origin by NASCET criteria. Mild vertebral artery regularity favoring atherosclerosis. Acute findings discussed with and reconfirmed by PA BOWIE TRAN on 04/13/2016 at 7:40 pm. Electronically Signed   By: Elon Alas  M.D.   On: 04/13/2016 19:44    Scheduled Meds: . aspirin EC  81 mg Oral q1800  . buPROPion  200 mg Oral BID WC  . HYDROcodone-acetaminophen  1 tablet Oral QHS  . levothyroxine  112 mcg Oral QAC breakfast  . magnesium oxide  400 mg Oral Daily  . metoprolol succinate  12.5 mg Oral BID  .  pantoprazole  40 mg Oral Daily  . pravastatin  20 mg Oral Daily  . rOPINIRole  1 mg Oral BID   Continuous Infusions: . heparin 900 Units/hr (04/15/16 0833)     LOS: 0 days   Time spent: 25 minutes.  Vance Gather, MD [facility] Pager [phone]  If 7PM-7AM, please contact night-coverage [email or web address] Password [email or web address] 04/15/2016, 1:09 PM

## 2016-04-15 NOTE — Progress Notes (Signed)
[facility] for Heparin Indication: antiphospholipid AB syndrome  Allergies  Allergen Reactions  . Bee Venom Anaphylaxis  . Hornet Venom Anaphylaxis    Anaphylaxis Shock SHOCK  . Reglan [Metoclopramide] Shortness Of Breath and Other (See Comments)    Tongue got numb; didn't feel good  . Vitamin K Anaphylaxis and Rash    "kills me" iv  . Vitamin K And Related Anaphylaxis  . Benadryl [Diphenhydramine Hcl]     Rash  . Crestor [Rosuvastatin Calcium]     Muscle Aches  . Doxycycline     Tongue burning, thrush  . Levofloxacin     Rash  . Zocor [Simvastatin]     Muscle Aches  . Ciprofloxacin Rash  . Daypro [Oxaprozin] Rash    Rash  . Levofloxacin Rash  . Phytonadione Rash  . Sulfonamide Derivatives Rash    Patient Measurements: Height: 5' 4.96" (165 cm) Weight: 180 lb (81.6 kg) IBW/kg (Calculated) : 56.91 Heparin Dosing Weight: 74  Vital Signs: Temp: 97.8 F (36.6 C) (09/21 0453) Temp Source: Oral (09/21 0453) BP: 112/62 (09/21 0453) Pulse Rate: 66 (09/21 0453)  Labs:  Recent Labs  04/13/16 1601  04/13/16 1616 04/14/16 0525 04/14/16 1501 04/15/16 0620  HGB  --   < > 14.1 13.8  --  13.9  HCT  --   --  43.5 43.3  --  42.9  PLT  --   --  265 276  --  245  APTT  --   --  32  --   --   --   LABPROT  --   --  17.2*  --   --  15.7*  INR 1.6  --  1.39  --   --  1.25  HEPARINUNFRC  --   --   --  0.14* 0.73* 1.09*  CREATININE  --   --  1.42*  --   --  1.26*  < > = values in this interval not displayed.  Estimated Creatinine Clearance: 45.1 mL/min (by C-G formula based on SCr of 1.26 mg/dL (H)).  Assessment: 70 y.o. female with antiphospholipid antibody syndrome, Coumadin on on hold. for heparin.   Goal of Therapy:  Heparin level 0.3-0.7 units/ml Monitor platelets by anticoagulation protocol: Yes   Plan:  Hold heparin x 1 hours, then Decrease Heparin 900 units/hr. Check heparin level in 8 hours.   Phillis Knack, PharmD, BCPS

## 2016-04-15 NOTE — Progress Notes (Signed)
Neurology PROGRESS NOTE   HISTORY OF PRESENT ILLNESS (per record) KOLENE CORLESS is an 70 y.o. female with a history of hypertension, hyperlipidemia, hypothyroidism, stroke, TIA, ETT, antiphospholipid antibody syndrome, chronic anticoagulation with Coumadin, fibromyalgia, depression and anxiety, presenting with complaint of left sided numbness as well as triple vision involving her right eye. Symptoms were first noticed yesterday morning. She's had no symptoms of this nature previously. She is not been experiencing a headache. Change in speech has been noted. She's had no facial droop. INR today was 1.6. CT scan of her head showed no acute intracranial abnormality. MRI of her brain showed no signs of an acute stroke. 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   LSN: 04/12/2016 tPA Given: No: No clear objective focal deficit mRankin:   SUBJECTIVE (INTERVAL HISTORY) No family members present. The patient states she has been on warfarin as well as aspirin. She has a history of migraine headaches and TIAs and is on warfarin for h/o antiphospholipid syndrome. She has been experiencing double vision.   OBJECTIVE Temp:  [97.7 F (36.5 C)-98.4 F (36.9 C)] 97.9 F (36.6 C) (09/21 1007) Pulse Rate:  [61-79] 61 (09/21 1007) Cardiac Rhythm: Heart block (09/21 0759) Resp:  [18-20] 20 (09/21 1007) BP: (101-147)/(62-91) 103/72 (09/21 1007) SpO2:  [95 %-98 %] 96 % (09/21 1007)  CBC:   Recent Labs Lab 04/13/16 1616 04/14/16 0525 04/15/16 0620  WBC 9.3 10.3 10.2  NEUTROABS 6.2  --   --   HGB 14.1 13.8 13.9  HCT 43.5 43.3 42.9  MCV 97.3 97.7 97.1  PLT 265 276 [id number]    Basic Metabolic Panel:   Recent Labs Lab 04/13/16 1616 04/15/16 0620  NA 138 136  K 4.2 4.3  CL 102 101  CO2 28 24  GLUCOSE 147* 116*  BUN 17 16  CREATININE 1.42*  1.26*  CALCIUM 9.7 9.0  MG  --  2.2    Lipid Panel:     Component Value Date/Time   CHOL 204 (H) 04/14/2016 0525   TRIG 231 (H) 04/14/2016 0525   HDL 40 (L) 04/14/2016 0525   CHOLHDL 5.1 04/14/2016 0525   VLDL 46 (H) 04/14/2016 0525   LDLCALC 118 (H) 04/14/2016 0525   HgbA1c:  Lab Results  Component Value Date   HGBA1C 6.0 (H) 04/14/2016   Urine Drug Screen: No results found for: LABOPIA, COCAINSCRNUR, LABBENZ, AMPHETMU, THCU, LABBARB    IMAGING  Ct Head Wo Contrast 04/13/2016 1. No intracranial abnormality. Age related volume loss stable from the recent prior studies.  2. Sinus mucosal thickening, now only minor, improved from the recent prior exams.    Mr Angiogram Head Wo Contrast 04/13/2016  MRI HEAD:  Multiple subcentimeter foci of abnormal enhancement in the supra- and infratentorial brain predominately in a perivascular distribution most consistent with leptomeningitis. Though findings are often infectious or inflammatory, given given patient's history of breast cancer, neoplasm is a consideration. Recommend correlation with cerebral spinal fluid studies.   MRA HEAD:  Negative ; no typical findings of vasculopathy.    MRA NECK:  50% stenosis RIGHT internal carotid artery origin by NASCET criteria.Mild vertebral artery regularity favoring atherosclerosis.        PHYSICAL EXAM Pleasant middle aged lady not in distress. . Afebrile. Head is nontraumatic. Neck is supple without bruit.    Cardiac exam no murmur or gallop. Lungs are clear to auscultation. Distal  pulses are well felt.No neck stiffness    Neurological Exam ;  Awake  Alert oriented x 3.  R 6th nerve palsy Hearing is normal. Palatal movements are normal. Face symmetric. Tongue midline. Normal strength, tone, reflexes and coordination. subjective left hemibody diminished.sensation.  Gait deferred.      ASSESSMENT/PLAN Ms. JOURNI CANIPE is a 70 y.o. female with history of upper extremity  thrombosis in 2013, previous stroke, neuromuscular disorder, hypertension, hyperlipidemia, previous TIAs, history of a DVT, history of antiphospholipid antibody syndrome, Lupus, history of migraine headaches, fibromyalgia, depression, chronic anticoagulation on Coumadin, asthma, and anxiety with severe panic attacks presenting with left-sided numbness, diplopia, and speech difficulties. She did not receive IV t-PA due to Coumadin therapy.    MRI consistent with leptomengieal enhancement along with multiple foci of intraprachymal white matter disease  Differential for this can be broad  Consideration for lupus flare, MS, Lyme, Sarcoid and Cancer given her hx has to be taken into consideration  - recommend LP to look for the following Protein, White cells, glucose, Cytology, Lyme titer, CSF ACE, oligoclonal bands, along with a viral panel and culture  - May also considering starting after LP and see if this will improve her symptoms as Lupus, MS and sarcoid will respond to that

## 2016-04-15 NOTE — Progress Notes (Signed)
[place] for Heparin Indication: antiphospholipid AB syndrome  Allergies  Allergen Reactions  . Bee Venom Anaphylaxis  . Hornet Venom Anaphylaxis    Anaphylaxis Shock SHOCK  . Reglan [Metoclopramide] Shortness Of Breath and Other (See Comments)    Tongue got numb; didn't feel good  . Vitamin K Anaphylaxis and Rash    "kills me" iv  . Vitamin K And Related Anaphylaxis  . Benadryl [Diphenhydramine Hcl]     Rash  . Crestor [Rosuvastatin Calcium]     Muscle Aches  . Doxycycline     Tongue burning, thrush  . Levofloxacin     Rash  . Zocor [Simvastatin]     Muscle Aches  . Ciprofloxacin Rash  . Daypro [Oxaprozin] Rash    Rash  . Levofloxacin Rash  . Phytonadione Rash  . Sulfonamide Derivatives Rash    Patient Measurements: Height: 5' 4.96" (165 cm) Weight: 180 lb (81.6 kg) IBW/kg (Calculated) : 56.91 Heparin Dosing Weight: 74  Vital Signs: Temp: 97.8 F (36.6 C) (09/21 1400) Temp Source: Oral (09/21 1400) BP: 110/74 (09/21 1400) Pulse Rate: 64 (09/21 1400)  Labs:  Recent Labs  04/13/16 1601  04/13/16 1616  04/14/16 0525 04/14/16 1501 04/15/16 0620 04/15/16 1012 04/15/16 1633  HGB  --   < > 14.1  --  13.8  --  13.9  --   --   HCT  --   --  43.5  --  43.3  --  42.9  --   --   PLT  --   --  265  --  276  --  245  --   --   APTT  --   --  32  --   --   --   --  103*  --   LABPROT  --   --  17.2*  --   --   --  15.7*  --   --   INR 1.6  --  1.39  --   --   --  1.25  --   --   HEPARINUNFRC  --   --   --   < > 0.14* 0.73* 1.09*  --  0.55  CREATININE  --   --  1.42*  --   --   --  1.26*  --   --   < > = values in this interval not displayed.  Estimated Creatinine Clearance: 45.1 mL/min (by C-G formula based on SCr of 1.26 mg/dL (H)).  Assessment: 70 y.o. female with antiphospholipid antibody syndrome. PTA Coumadin on hold - continues on heparin.   HL therapeutic at 0.55. LP planned for tomorrow morning - heparin likely to  stop late this evening or early tomorrow AM. CBC WNL. No bleeding reported.   Goal of Therapy:  Heparin level 0.3-0.7 units/ml Monitor platelets by anticoagulation protocol: Yes   Plan:  -Continue heparin at 900 units/hr -Monitor CBC, s/sx bleeding   Stephens November, PharmD Clinical Pharmacist 5:13 PM, 04/15/2016

## 2016-04-15 NOTE — Evaluation (Signed)
Speech Language Pathology Evaluation Patient Details Name: [name] MRN: [id number] DOB: 1945/11/28 Today's Date: 04/15/2016 Time: [id number] SLP Time Calculation (min) (ACUTE ONLY): 14 min  Problem List:  Patient Active Problem List   Diagnosis Date Noted  . Vision changes 04/13/2016  . Chronic anticoagulation 08/04/2015  . Pain in limb 12/26/2013  . Chest pain 11/27/2013  . GERD - post failed open Nissen 11/23/2012  . Dysphagia, unspecified(787.20) 09/28/2012  . S/P Open Nissen 1998 Dr. Mendel Ryder 08/18/2012  . Thrombosis, upper extremity artery ([place]) 04/17/2012  . Antiphospholipid antibody syndrome ([place]) 06/01/2011  . Lupus anticoagulant positive 06/01/2011  . Chest pain   . Hx-TIA (transient ischemic attack)   . Hypothyroidism   . Breast cancer ([place])   . History of DVT (deep vein thrombosis)   . History of antiphospholipid antibody syndrome    Past Medical History:  Past Medical History:  Diagnosis Date  . Anxiety    severe panic attacks  . Arthritis   . Asthma    as child  . Breast cancer ([place])    right  . Chest pain   . Chronic anticoagulation 08/04/2015  . Collapsed lung    hx of, left  . Complication of anesthesia    "hard to put asleep"  . Depression   . Fibromyalgia   . GERD (gastroesophageal reflux disease)   . H/O hiatal hernia   . Headache(784.0)   . Hepatitis 1990   "from eating at restaurant"  . History of antiphospholipid antibody syndrome   . 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  . Hx-TIA (transient ischemic attack)   . Hyperlipidemia   . Hypertension   . Hypothyroidism   . Neuromuscular disorder ([place])   . Pneumonia    hx of  . Stroke ([place])   . Thrombosis, upper extremity artery ([place]) 04/17/2012   Left digital artery  October 1998 - new dx antiphospholipid antibody syndrome   Past Surgical History:  Past Surgical History:  Procedure Laterality Date  . ABDOMINAL HYSTERECTOMY    . BREAST SURGERY Right    x3  .  BUNIONECTOMY Bilateral 30 years ago  . CARDIAC CATHETERIZATION  07/12/1997   NORMAL LEFT VENTRICULAR FUNCTION WITH EF AT LEAST 70-75%  . ESOPHAGEAL MANOMETRY N/A 11/06/2012   Procedure: ESOPHAGEAL MANOMETRY (EM);  Surgeon: Pedro Earls, MD;  Location: [facility];  Service: General;  Laterality: N/A;  . ESOPHAGOGASTRODUODENOSCOPY ENDOSCOPY    . KNEE ARTHROSCOPY Right   . LAPAROSCOPIC NISSEN FUNDOPLICATION N/A [id number]   Procedure: LAPAROSCOPIC TAKEDOWN  PERI-HIATAL HERNIA    REPAIR;  Surgeon: Pedro Earls, MD;  Location: [facility] ORS;  Service: General;  Laterality: N/A;  . [place]  . RIGHT OOPHORECTOMY  1980  . THYROIDECTOMY  in 20's  . TONSILLECTOMY  70 years old  . UPPER GI ENDOSCOPY N/A 12/12/2012   Procedure: UPPER GI ENDOSCOPY;  Surgeon: Pedro Earls, MD;  Location: [facility] ORS;  Service: General;  Laterality: N/A;   HPI:  70 y.o. female with medical history significant fo TIA, breast cancer,pna, GERD, anxiet, antiphospholipid AB syndrome,adin, HLD, HTN. Patient presents to the ED with c/o left sided numbness and blurred vision involving R eye. CT and MRI on 9/19 negative for acute infarct.   Assessment / Plan / Recommendation Clinical Impression  Pt scored in the average range on the [employer]ogn[employer] standardized assessment. Pt without complaints with speech-language-cognition and no identifiable significant impairments. Speech intelligible with questionable slight  distortions not impacting function. Pt and OT report she is seeing quadruple from right eye which can be addressed with OT. No ST follow up needed.        SLP Assessment  Patient does not need any further Speech Lanaguage Pathology Services    Follow Up Recommendations  None    Frequency and Duration           SLP Evaluation Cognition  Overall Cognitive Status: Within Functional Limits for tasks assessed Orientation Level: Oriented X4 Attention: Sustained Sustained Attention: Appears intact Memory:  Appears intact Awareness: Appears intact Problem Solving: Appears intact Safety/Judgment: Appears intact       Comprehension  Auditory Comprehension Overall Auditory Comprehension: Appears within functional limits for tasks assessed Visual Recognition/Discrimination Discrimination: Not tested Reading Comprehension Reading Status: Not tested (disturbance on right) Interfering Components: Visual perceptual    Expression Expression Primary Mode of Expression: Verbal Verbal Expression Overall Verbal Expression: Appears within functional limits for tasks assessed Pragmatics: No impairment Written Expression Dominant Hand: Right Written Expression: Not tested   Oral / Motor  Oral Motor/Sensory Function Overall Oral Motor/Sensory Function: Within functional limits Motor Speech Overall Motor Speech: Appears within functional limits for tasks assessed Intelligibility: Intelligible Motor Planning: Witnin functional limits   GO          Functional Assessment Tool Used: skilled clinical judgement Functional Limitations: Memory Memory Current Status [id number]): 0 percent impaired, limited or restricted Memory Goal Status [id number]): 0 percent impaired, limited or restricted Memory Discharge Status [id number]): 0 percent impaired, limited or restricted         Houston Siren 04/15/2016, 10:47 AM  Orbie Pyo Colvin Caroli.Ed [employer] [phone]

## 2016-04-15 NOTE — Progress Notes (Signed)
Pt called out with complaints of headache 10/10 right side.  Pt states "I've been hurting since 3am." Upon 0400 assessment and q4h vitals, no complaint was made to RN or NT by patient.  On call paged, orders received for tylenol 650mg  q4h PRN. Will continue to monitor patient.

## 2016-04-16 ENCOUNTER — Inpatient Hospital Stay (HOSPITAL_COMMUNITY): Payer: Medicare HMO

## 2016-04-16 ENCOUNTER — Other Ambulatory Visit (HOSPITAL_COMMUNITY): Payer: Self-pay | Admitting: Diagnostic Radiology

## 2016-04-16 DIAGNOSIS — I10 Essential (primary) hypertension: Secondary | ICD-10-CM

## 2016-04-16 DIAGNOSIS — E785 Hyperlipidemia, unspecified: Secondary | ICD-10-CM

## 2016-04-16 DIAGNOSIS — R930 Abnormal findings on diagnostic imaging of skull and head, not elsewhere classified: Secondary | ICD-10-CM

## 2016-04-16 LAB — CBC
HCT: 41.8 % (ref 36.0–46.0)
Hemoglobin: 13.5 g/dL (ref 12.0–15.0)
MCH: 31.4 pg (ref 26.0–34.0)
MCHC: 32.3 g/dL (ref 30.0–36.0)
MCV: 97.2 fL (ref 78.0–100.0)
Platelets: 251 10*3/uL (ref 150–400)
RBC: 4.3 MIL/uL (ref 3.87–5.11)
RDW: 13.9 % (ref 11.5–15.5)
WBC: 9.8 10*3/uL (ref 4.0–10.5)

## 2016-04-16 LAB — CSF CELL COUNT WITH DIFFERENTIAL
Eosinophils, CSF: 0 % (ref 0–1)
Lymphs, CSF: 98 % — ABNORMAL HIGH (ref 40–80)
Monocyte-Macrophage-Spinal Fluid: 2 % — ABNORMAL LOW (ref 15–45)
RBC Count, CSF: 3 /mm3 — ABNORMAL HIGH
Segmented Neutrophils-CSF: 0 % (ref 0–6)
Tube #: 1
WBC, CSF: 12 /mm3 (ref 0–5)

## 2016-04-16 LAB — HEPARIN LEVEL (UNFRACTIONATED): Heparin Unfractionated: 0.31 IU/mL (ref 0.30–0.70)

## 2016-04-16 LAB — PROTEIN, CSF: Total  Protein, CSF: 40 mg/dL (ref 15–45)

## 2016-04-16 LAB — PROTIME-INR
INR: 1.07
Prothrombin Time: 13.9 seconds (ref 11.4–15.2)

## 2016-04-16 LAB — GLUCOSE, CSF: Glucose, CSF: 74 mg/dL — ABNORMAL HIGH (ref 40–70)

## 2016-04-16 MED ORDER — HEPARIN (PORCINE) IN NACL 100-0.45 UNIT/ML-% IJ SOLN
950.0000 [IU]/h | INTRAMUSCULAR | Status: DC
  Administered 2016-04-17: 950 [IU]/h via INTRAVENOUS
  Filled 2016-04-16: qty 250

## 2016-04-16 MED ORDER — LIDOCAINE HCL 1 % IJ SOLN
INTRAMUSCULAR | Status: AC
  Administered 2016-04-16: 5 mL via INTRADERMAL
  Filled 2016-04-16: qty 10

## 2016-04-16 NOTE — Progress Notes (Signed)
[place] for Heparin Indication: antiphospholipid AB syndrome  Allergies  Allergen Reactions  . Bee Venom Anaphylaxis  . Hornet Venom Anaphylaxis    Anaphylaxis Shock SHOCK  . Reglan [Metoclopramide] Shortness Of Breath and Other (See Comments)    Tongue got numb; didn't feel good  . Vitamin K Anaphylaxis and Rash    "kills me" iv  . Vitamin K And Related Anaphylaxis  . Benadryl [Diphenhydramine Hcl]     Rash  . Crestor [Rosuvastatin Calcium]     Muscle Aches  . Doxycycline     Tongue burning, thrush  . Levofloxacin     Rash  . Zocor [Simvastatin]     Muscle Aches  . Ciprofloxacin Rash  . Daypro [Oxaprozin] Rash    Rash  . Levofloxacin Rash  . Phytonadione Rash  . Sulfonamide Derivatives Rash    Patient Measurements: Height: 5' 4.96" (165 cm) Weight: 180 lb (81.6 kg) IBW/kg (Calculated) : 56.91 Heparin Dosing Weight: 74  Vital Signs: Temp: 98.1 F (36.7 C) (09/22 1339) Temp Source: Oral (09/22 1339) BP: 105/62 (09/22 1339) Pulse Rate: 9 (09/22 1339)  Labs:  Recent Labs  04/13/16 1616  04/14/16 0525 04/14/16 1501 04/15/16 0620 04/15/16 1012 04/15/16 1633 04/16/16 0330  HGB 14.1  --  13.8  --  13.9  --   --  13.5  HCT 43.5  --  43.3  --  42.9  --   --  41.8  PLT 265  --  276  --  245  --   --  251  APTT 32  --   --   --   --  103*  --   --   LABPROT 17.2*  --   --   --  15.7*  --   --  13.9  INR 1.39  --   --   --  1.25  --   --  1.07  HEPARINUNFRC  --   < > 0.14* 0.73* 1.09*  --  0.55  --   CREATININE 1.42*  --   --   --  1.26*  --   --   --   < > = values in this interval not displayed.  Estimated Creatinine Clearance: 45.1 mL/min (by C-G formula based on SCr of 1.26 mg/dL (H)).  Assessment: 70 y.o. female with antiphospholipid antibody syndrome. PTA Coumadin on hold for LP which was done today.  Heparin was off at 03 am for LP and resumed at 1220 today at prior rate of 900 units/hr.HL therapeutic at 0.55 last  night on 900 units/hr. CBC WNL. No bleeding reported.   Goal of Therapy:  Heparin level 0.3-0.7 units/ml Monitor platelets by anticoagulation protocol: Yes   Plan:  -Continue heparin at 900 units/hr, check 8 hr HL tonight at 2030 -Monitor CBC, s/sx bleeding ?? Resume coumadin??  Eudelia Bunch, Pharm.D. [id number] 04/16/2016 3:00 PM

## 2016-04-16 NOTE — Progress Notes (Signed)
RN called IR to verify LP schedule time and was told pt was scheduled for 8 am 04/16/16 therefore will change time to about 11 am. Pt /INR this am is 1.07/ 13.9.Pharmacy made aware No.new orders noted.

## 2016-04-16 NOTE — Progress Notes (Signed)
ANTICOAGULATION CONSULT NOTE - FOLLOW UP    HL = 0.31 (goal 0.3 - 0.7 units/mL) Heparin dosing weight = 74 kg   Assessment: 69 YOF with APS to continue on IV heparin while Coumadin is on hold.  Heparin level is therapeutic but has trended down post interruption.  No bleeding reported.   Plan: - Increase heparin gtt slightly to 950 units/hr - F/U AM labs - F/U resume Coumadin   [name] D. Mina Marble, PharmD, BCPS 04/16/2016, 9:43 PM

## 2016-04-16 NOTE — Final Progress Note (Signed)
Received report  from RN,that pt for LP in am but time unknown . & heparin drip @ 9 m l/hr should be off 8 hrs prior to procedure. On RN rounds at 0300, Pt. was . alert and oriented x 4 with no distress.RN was about to hanged new heparin bag but  as per Pt. MD informed her and family that heparin drip should be stopped at 2300, 04/15/16 and schedule time for LP is between 10 a-11am .Heparin drip then stopped @ 0305 and pharmacist, Santiago Glad made aware. Lab also notified to draw am lab. . RN will continue to monitor pt.

## 2016-04-16 NOTE — Progress Notes (Signed)
OT Cancellation Note  Patient Details Name: [name] MRN: [id number] DOB: Mar 30, 1946   Cancelled Treatment:    Reason Eval/Treat Not Completed: Medical issues which prohibited therapy (bedrest s/p LP). Will follow up for OT treat tomorrow.  Binnie Kand M.S., OTR/L Pager: [phone]  04/16/2016, 5:05 PM

## 2016-04-16 NOTE — Significant Event (Signed)
CRITICAL VALUE ALERT  Critical value received:  SF WBC at 12   Date of notification:  04/16/16  Time of notification:  [id number]  Critical value read back:Yes.    Nurse who received alert:  HM  MD notified (1st page):  Dr. Charlies Silvers  Time of first page:  [phone]

## 2016-04-16 NOTE — Procedures (Signed)
Procedure: LP w Fluoro at L2-3. Specimen: CSF to lab Bleeding: minimal. Complications: None immediate. Patient   -Condition: Stable.  -Disposition:  To floor.  Full Radiology Report to Follow under IMAGING 

## 2016-04-16 NOTE — Progress Notes (Signed)
Pt with orders for shower seat and walker. Pt states she has this equipment at home. CM will continue to monitor for d/c needs.

## 2016-04-16 NOTE — Care Management Note (Signed)
Case Management Note  Patient Details  Name: [name] MRN: [id number] Date of Birth: 06/20/1946  Subjective/Objective:                    Action/Plan: Plan is for patient to discharge home once medically stable. Plan is for LP today. CM following for d/c needs.   Expected Discharge Date:                  Expected Discharge Plan:     In-House Referral:     Discharge planning Services     Post Acute Care Choice:    Choice offered to:     DME Arranged:    DME Agency:     HH Arranged:    HH Agency:     Status of Service:     If discussed at [employer], dates discussed:    Additional Comments:  Pollie Friar, RN 04/16/2016, 3:28 PM

## 2016-04-16 NOTE — Progress Notes (Addendum)
Patient ID: [name], female   DOB: 16-Jun-1946, 70 y.o.   MRN: [id number]  PROGRESS NOTE    [name]  [id number] DOB: 1946/02/14 DOA: 04/13/2016  PCP: [name]   Brief Narrative:  69 -year-old female with past medical history significant for antiphospholipid antibody syndrome, on chronic anticoagulation with Coumadin, TIA, dyslipidemia and hypertension who presented to [facility] with left-sided numbness and blurred vision involving the right eye for about 24 hours prior to this admission. INR was subtherapeutic at 1.6. CT head showed no acute intracranial finding. 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 Neurology has seen her in consultation and plan is for lumbar puncture 04/16/2016.   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 DME ordered.  Hypothyroidism - Continue Synthroid 125 g daily - Patient reports compliance with Synthroid. Her TSH is 45. This needs outpatient follow-up.  Antiphospholipid antibody syndrome - Currently on heparin drip and anticipation for LP today  Essential hypertension - Continue metoprolol 12.5 mg twice daily  Dyslipidemia - Continue pravastatin 20 mg daily   Depression - Continue wellbutrin.  History of hypomagnesemia -  On chronic magnesium supplementation   DVT prophylaxis: SCDs bilaterally Code Status: full code    Family Communication: No family at the bedside Disposition Plan: Depending on LP results, at this time not stable for discharge   Consultants:   Neurology, Dr. Leonie Man  Procedures:   2 D ECHO 9/19 - EF 50-55%, grade 1 DD  Antimicrobials:   None     Subjective: Says she still blurred vision.   Objective: Vitals:   04/15/16 2121 04/16/16 0044 04/16/16 0508 04/16/16 1014  BP: 116/68 114/66 99/63 110/73  Pulse: 70 73 63 64  Resp: 20 18 20 18   Temp: 98.1 F (36.7 C) 98 F (36.7 C) 97.9 F (36.6 C) 97.7 F (36.5 C)  TempSrc: Oral Oral Oral Oral  SpO2: 96% 98% 97% 96%  Weight:      Height:        Intake/Output Summary (Last 24 hours) at 04/16/16 1021 Last data filed at 04/16/16 0300  Gross per 24 hour  Intake           286.05 ml  Output                0 ml  Net           286.05 ml   Filed Weights   04/13/16 2115  Weight: 81.6 kg (180 lb)    Examination:  General exam: Appears calm and comfortable  Respiratory system: Clear to auscultation. Respiratory effort normal. Cardiovascular system: S1 & S2 heard, RRR. No JVD, murmurs, rubs, gallops or clicks. No pedal edema. Gastrointestinal system: Abdomen is nondistended, soft and nontender. No organomegaly or masses felt. Normal bowel sounds heard. Central nervous system: Alert and oriented.  Extremities: Symmetric 5 x 5 power. Skin: No rashes, lesions or ulcers Psychiatry: Judgement and insight appear normal. Mood & affect appropriate.   Data Reviewed:  I have personally reviewed following labs and imaging studies  CBC:  Recent Labs Lab 04/13/16 1616 04/14/16 0525 04/15/16 0620 04/16/16 0330  WBC 9.3 10.3 10.2 9.8  NEUTROABS 6.2  --   --   --   HGB 14.1 13.8 13.9 13.5  HCT 43.5 43.3 42.9 41.8  MCV 97.3 97.7 97.1 97.2  PLT 265 276 245 [id number]   Basic Metabolic Panel:  Recent Labs Lab 04/13/16 1616 04/15/16 0620  NA 138 136  K 4.2 4.3  CL 102 101  CO2 28 24  GLUCOSE 147* 116*  BUN 17 16  CREATININE  1.42* 1.26*  CALCIUM 9.7 9.0  MG  --  2.2   GFR: Estimated Creatinine Clearance: 45.1 mL/min (by C-G formula based on SCr of 1.26 mg/dL (H)). Liver Function Tests:  Recent Labs Lab 04/13/16 1616  AST 20  ALT 15  ALKPHOS 99  BILITOT 0.8  PROT 7.3  ALBUMIN 4.2   No results for input(s): LIPASE, AMYLASE in the last 168 hours. No results for input(s): AMMONIA in the last 168 hours. Coagulation Profile:  Recent Labs Lab 04/13/16 1601 04/13/16 1616 04/15/16 0620 04/16/16 0330  INR 1.6 1.39 1.25 1.07   Cardiac Enzymes: No results for input(s): CKTOTAL, CKMB, CKMBINDEX, TROPONINI in the last 168 hours. BNP (last 3 results) No results for input(s): PROBNP in the last 8760 hours. HbA1C:  Recent Labs  04/14/16 0525  HGBA1C 6.0*   CBG: No results for input(s): GLUCAP in the last 168 hours. Lipid Profile:  Recent Labs  04/14/16 0525  CHOL 204*  HDL 40*  LDLCALC 118*  TRIG 231*  CHOLHDL 5.1   Thyroid Function Tests:  Recent Labs  04/15/16 0620  TSH 45.129*   Anemia Panel: No results for input(s): VITAMINB12, FOLATE, FERRITIN, TIBC, IRON, RETICCTPCT in the last 72 hours. Urine analysis:    Component Value Date/Time   COLORURINE YELLOW 11/24/2008 0359   APPEARANCEUR CLEAR 11/24/2008 0359   LABSPEC 1.007 11/24/2008 0359   PHURINE 6.0 11/24/2008 0359   GLUCOSEU NEGATIVE 11/24/2008 0359   HGBUR MODERATE (A) 11/24/2008 0359   BILIRUBINUR NEGATIVE 11/24/2008 0359   KETONESUR NEGATIVE 11/24/2008 0359   PROTEINUR NEGATIVE 11/24/2008 0359   UROBILINOGEN 0.2 11/24/2008 0359   NITRITE NEGATIVE 11/24/2008 0359   LEUKOCYTESUR NEGATIVE 11/24/2008 0359   Sepsis Labs: @LABRCNTIP (procalcitonin:4,lacticidven:4)   )No results found for this or any previous visit (from the past 240 hour(s)).    Radiology Studies: Ct Head Wo Contrast Result Date: 04/13/2016 1. No intracranial abnormality. Age related volume loss stable from the recent prior studies. 2. Sinus  mucosal thickening, now only minor, improved from the recent prior exams.   Mr Angiogram Head Wo Contrast Result Date: 04/13/2016 MRI HEAD: Multiple subcentimeter foci of abnormal enhancement in the supra- and infratentorial brain predominately in a perivascular distribution most consistent with leptomeningitis. Though findings are often infectious or inflammatory, given given patient's history of breast cancer, neoplasm is a consideration. Recommend correlation with cerebral spinal fluid studies. MRA HEAD: Negative ; no typical findings of vasculopathy. MRA NECK: 50% stenosis RIGHT internal carotid artery origin by NASCET criteria.Mild vertebral artery regularity favoring atherosclerosis. Acute findings discussed with and reconfirmed by PA BOWIE TRAN on 04/13/2016 at 7: 40 pm.   Mr Angiogram Neck W Or Wo Contrast Result Date: 9/19/2017MRI HEAD: Multiple subcentimeter foci of abnormal enhancement in the supra- and infratentorial brain predominately in a perivascular distribution most consistent with leptomeningitis. Though findings are often infectious or inflammatory, given given  patient's history of breast cancer, neoplasm is a consideration. Recommend correlation with cerebral spinal fluid studies. MRA HEAD: Negative ; no typical findings of vasculopathy. MRA NECK: 50% stenosis RIGHT internal carotid artery origin by NASCET criteria.Mild vertebral artery regularity favoring atherosclerosis. Acute findings discussed with and reconfirmed by PA BOWIE TRAN on 04/13/2016 at 7: 40 pm.   Mr Jeri Cos Wo Contrast Result Date: 04/13/2016 MRI HEAD: Multiple subcentimeter foci of abnormal enhancement in the supra- and infratentorial brain predominately in a perivascular distribution most consistent with leptomeningitis. Though findings are often infectious or inflammatory, given given patient's history of breast cancer, neoplasm is a consideration. Recommend correlation with cerebral spinal fluid studies. MRA HEAD:  Negative ; no typical findings of vasculopathy. MRA NECK: 50% stenosis RIGHT internal carotid artery origin by NASCET criteria. Mild vertebral artery regularity favoring atherosclerosis. Acute findings discussed with and reconfirmed by PA BOWIE TRAN on 04/13/2016 at 7:40 pm.      Scheduled Meds: . aspirin EC  81 mg Oral q1800  . buPROPion  200 mg Oral BID WC  . HYDROcodone-acetaminophen  1 tablet Oral QHS  . levothyroxine  125 mcg Oral QAC breakfast  . magnesium oxide  400 mg Oral Daily  . metoprolol succinate  12.5 mg Oral BID  . pantoprazole  40 mg Oral Daily  . pravastatin  20 mg Oral Daily  . rOPINIRole  1 mg Oral BID   Continuous Infusions: . heparin Stopped (04/16/16 0305)     LOS: 1 day    Time spent: 25 minutes  Greater than 50% of the time spent on counseling and coordinating the care.   Leisa Lenz, MD [facility] Pager [phone]  If 7PM-7AM, please contact night-coverage [email or web address] Password [facility] 04/16/2016, 10:21 AM

## 2016-04-16 NOTE — Progress Notes (Addendum)
Per Raquel Sarna from Radiology, pt need to have gold top and red top draw.Plebotomy called to notify of change. Spoke with Hassan Rowan, she will call Raquel Sarna to put orders in at this time before sample can be drawn.  MD paged r/t restart of heparin gtts and order for red & gold top?[name] Awaiting for call back.   MD ok to restart heparin gtt. Phlebotomy at bedside to drawn red/gold samples.   Spoke to Meeker again in radiology r/t pt's bedrest order. Pt verbalized understanding r/t bedrest x4 hours.   Dahlia Nifong,RN

## 2016-04-17 DIAGNOSIS — E038 Other specified hypothyroidism: Secondary | ICD-10-CM

## 2016-04-17 LAB — CBC
HCT: 42.4 % (ref 36.0–46.0)
Hemoglobin: 13.4 g/dL (ref 12.0–15.0)
MCH: 30.9 pg (ref 26.0–34.0)
MCHC: 31.6 g/dL (ref 30.0–36.0)
MCV: 97.9 fL (ref 78.0–100.0)
Platelets: 277 10*3/uL (ref 150–400)
RBC: 4.33 MIL/uL (ref 3.87–5.11)
RDW: 14 % (ref 11.5–15.5)
WBC: 10.3 10*3/uL (ref 4.0–10.5)

## 2016-04-17 LAB — PROTIME-INR
INR: 1.03
Prothrombin Time: 13.5 seconds (ref 11.4–15.2)

## 2016-04-17 LAB — HEPARIN LEVEL (UNFRACTIONATED): Heparin Unfractionated: 0.47 IU/mL (ref 0.30–0.70)

## 2016-04-17 MED ORDER — LEVOTHYROXINE SODIUM 75 MCG PO TABS: 150.0000 ug | ORAL_TABLET | Freq: Every day | ORAL | Status: DC

## 2016-04-17 MED ORDER — SODIUM CHLORIDE 0.9 % IV SOLN
500.0000 mg | Freq: Every day | INTRAVENOUS | Status: AC
  Administered 2016-04-17 – 2016-04-19 (×3): 500 mg via INTRAVENOUS
  Filled 2016-04-17 (×4): qty 4

## 2016-04-17 MED ORDER — LEVOTHYROXINE SODIUM 25 MCG PO TABS
125.0000 ug | ORAL_TABLET | Freq: Every day | ORAL | Status: DC
  Administered 2016-04-18 – 2016-04-20 (×3): 125 ug via ORAL
  Filled 2016-04-17 (×4): qty 1

## 2016-04-17 NOTE — Progress Notes (Signed)
ANTICOAGULATION CONSULT NOTE  Pharmacy Consult for Heparin Indication: antiphospholipid AB syndrome  Patient Measurements: Height: 5' 4.96" (165 cm) Weight: 180 lb (81.6 kg) IBW/kg (Calculated) : 56.91 Heparin Dosing Weight: 74  Vital Signs: Temp: 97.7 F (36.5 C) (09/23 0547) Temp Source: Oral (09/23 0547) BP: 116/75 (09/23 0547) Pulse Rate: 58 (09/23 0547)  Labs:  Recent Labs  04/15/16 0620 04/15/16 1012 04/15/16 1633 04/16/16 0330 04/16/16 2051 04/17/16 0250  HGB 13.9  --   --  13.5  --  13.4  HCT 42.9  --   --  41.8  --  42.4  PLT 245  --   --  251  --  277  APTT  --  103*  --   --   --   --   LABPROT 15.7*  --   --  13.9  --  13.5  INR 1.25  --   --  1.07  --  1.03  HEPARINUNFRC 1.09*  --  0.55  --  0.31 0.47  CREATININE 1.26*  --   --   --   --   --     Estimated Creatinine Clearance: 45.1 mL/min (by C-G formula based on SCr of 1.26 mg/dL (H)).  Assessment: 69 y.o. female with antiphospholipid antibody syndrome. PTA Coumadin on hold for LP which has been completed. Heparin level remains at goal on 950 units/hr. CBC is WNL and no bleeding noted.    Goal of Therapy:  Heparin level 0.3-0.7 units/ml Monitor platelets by anticoagulation protocol: Yes   Plan:  -Continue heparin at 950 units/hr - Daily heparin level and CBC - F/u restart coumadin  Salome Arnt, PharmD, BCPS Pager # [phone] 04/17/2016 8:51 AM

## 2016-04-17 NOTE — Progress Notes (Addendum)
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 No signs of infection or symptoms of meningitis to suggest an infectious etiology. She is 17 years out from her breast cancer so I feel that this would be very unlikely to be related. I think that this most likely represents an inflammatory condition, possibly related to her autoimmune disease. Pending her studies, I would favor empiric steroids and have discussed this with her .   Recommendations: 1) Pulse steroids, 500mg  daily for three days.  2) will decide on further therapy based on response to initial treatment.  3) If further decline, may need to consider further evaluation such as biopsy.  Roland Rack, MD [facility] [phone]  If 7pm- 7am, please page neurology on call as listed in [place].

## 2016-04-17 NOTE — Progress Notes (Signed)
Occupational Therapy Treatment Patient Details Name: [name] MRN: [id number] DOB: 02/24/1946 Today's Date: 04/17/2016    History of present illness 70 y.o. female with medical history significant of antiphospholipid AB syndrome, chronic anticoagulation with coumadin, TIA, HLD, HTN.  Patient presents to the ED with c/o left sided numbness and blurred vision involving R eye. CT and MRI on 9/19 negative for acute infarct.   OT comments  Pt required min assist for functional mobility this session and close min guard for ADL. Pt unsteady on feet with poor safety awareness and use of RW for mobility. Pt with posterior LOB x1 with initial sit>stand from EOB causing her to fall back onto bed. Pt continues to report visual disturbances; pt reports eye patch does not help with triple/quadruple vision but did notice improvements in functional balance with use of eye patch. D/c plan remains appropriate. Will continue to follow acutely.   Follow Up Recommendations  Outpatient OT;Supervision/Assistance - 24 hour (neuro)    Equipment Recommendations  Tub/shower seat    Recommendations for Other Services      Precautions / Restrictions Precautions Precautions: Fall Restrictions Weight Bearing Restrictions: No       Mobility Bed Mobility Overal bed mobility: Independent Bed Mobility: Supine to Sit     Supine to sit: Min guard     General bed mobility comments: Min guard due to unsteadiness.  Transfers Overall transfer level: Needs assistance Equipment used: Rolling walker (2 wheeled) Transfers: Sit to/from Stand Sit to Stand: Min guard         General transfer comment: posterior LOB x1 with fall back onto bed during initial sit > stand from EOB    Balance Overall balance assessment: Needs assistance Sitting-balance support: Feet supported;No upper extremity supported Sitting balance-Leahy Scale: Good     Standing balance support: No upper extremity supported;During  functional activity Standing balance-Leahy Scale: Poor                     ADL Overall ADL's : Needs assistance/impaired     Grooming: Min guard;Standing;Wash/dry hands               Lower Body Dressing: Min guard;Sit to/from stand Lower Body Dressing Details (indicate cue type and reason): To doff/don socks Toilet Transfer: Minimal assistance;Ambulation;BSC;RW;Cueing for safety Toilet Transfer Details (indicate cue type and reason): Cues for safe use of RW. Pt unsteady throughout transfer especially lowering down to toilet wihtout UE support Toileting- Clothing Manipulation and Hygiene: Min guard;Sit to/from stand       Functional mobility during ADLs: Minimal assistance;Rolling walker General ADL Comments: Pt continues to be unsteady on feet; posterior LOB x1 casuing pt to fall back onto bed. Pt reports eye patch does not work but she is able to see single of objects if she closes one eye. Continues to report seeing 3-4 of objects in near and distance vision.       Vision                     Perception     Praxis      Cognition   Behavior During [facility]: [facility] for tasks assessed/performed Overall Cognitive Status: Within Functional Limits for tasks assessed                       Extremity/Trunk Assessment               Exercises     Shoulder Instructions  General Comments      Pertinent Vitals/ Pain       Pain Assessment: No/denies pain  Home Living                                          Prior Functioning/Environment              Frequency  Min 3X/week        Progress Toward Goals  OT Goals(current goals can now be found in the care plan section)  Progress towards OT goals: Not progressing toward goals - comment (balance deficits)  Acute Rehab OT Goals Patient Stated Goal: return to caring for mother OT Goal Formulation: With patient  Plan Discharge plan remains appropriate     Co-evaluation                 End of Session Equipment Utilized During Treatment: Gait belt;Rolling walker;Other (comment) (eye patch)   Activity Tolerance Patient tolerated treatment well   Patient Left in chair;with call bell/phone within reach;with family/visitor present   Nurse Communication Mobility status        Time: 1443-1501 OT Time Calculation (min): 18 min  Charges: OT General Charges $OT Visit: 1 Procedure OT Treatments $Self Care/Home Management : 8-22 mins  Binnie Kand M.S., OTR/L Pager: [phone]  04/17/2016, 3:11 PM

## 2016-04-17 NOTE — Progress Notes (Addendum)
Patient ID: [name], female   DOB: Dec 10, 1945, 70 y.o.   MRN: [id number]  PROGRESS NOTE    [name]  [id number] DOB: 07/20/1946 DOA: 04/13/2016  PCP: [name]   Brief Narrative:  70 -year-old female with past medical history significant for antiphospholipid antibody syndrome, on chronic anticoagulation with Coumadin, TIA, dyslipidemia and hypertension who presented to [facility] with left-sided numbness and blurred vision involving the right eye for about 24 hours prior to this admission. INR was subtherapeutic at 1.6. CT head showed no acute intracranial finding. 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 Neurology has seen her in consultation and plan is for lumbar puncture 04/16/2016.   Assessment & Plan:  Blurry vision / Leptomeningitis  - CT head showed no acute intracranial finding. 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 DME ordered.  Hypothyroidism - TSH 45, synthroid 112 mcg --> 125 mcg   Antiphospholipid antibody syndrome - Currently on heparin drip  Essential hypertension - Continue metoprolol 12.5 mg twice daily  Dyslipidemia - Continue pravastatin 20 mg daily   Depression - Continue wellbutrin.  History of hypomagnesemia -  On chronic magnesium supplementation    DVT prophylaxis: Heparin drip Code Status: full code  Family Communication: No family at the bedside Disposition Plan: Home once  cleared by neurology    Consultants:   Neurology, Dr. Leonie Man  Procedures:   2 D ECHO 9/19 - EF 50-55%, grade 1 DD  Antimicrobials:   None     Subjective: Has blurry and double vision.   Objective: Vitals:   04/16/16 2050 04/17/16 0153 04/17/16 0547 04/17/16 0954  BP: 116/78 131/75 116/75 117/67  Pulse: 70 63 (!) 58 66  Resp: 20 20 20 19   Temp: 98.1 F (36.7 C) 97.5 F (36.4 C) 97.7 F (36.5 C) 98.3 F (36.8 C)  TempSrc: Oral Other (Comment) Oral Oral  SpO2: 98% 100% 98% 95%  Weight:      Height:        Intake/Output Summary (Last 24 hours) at 04/17/16 1129 Last data filed at 04/17/16 0700  Gross per 24 hour  Intake            545.5 ml  Output                0 ml  Net            545.5 ml   Filed Weights   04/13/16 2115  Weight: 81.6 kg (180 lb)    Examination:  General exam: Appears calm and comfortable, stable  Respiratory system: No wheezing, no rhonchi  Cardiovascular system: S1 & S2 heard, Rate controlled  Gastrointestinal system: (+) BS, non tender  Central nervous system: Alert and oriented.Bluury and double vision  Extremities: Symmetric 5 x 5 power. Skin: No rashes, lesions or ulcers Psychiatry: Mood & affect appropriate.   Data Reviewed: I have personally reviewed following labs and imaging studies  CBC:  Recent Labs Lab 04/13/16 1616 04/14/16 0525 04/15/16 0620 04/16/16 0330 04/17/16 0250  WBC 9.3 10.3 10.2 9.8 10.3  NEUTROABS 6.2  --   --   --   --  HGB 14.1 13.8 13.9 13.5 13.4  HCT 43.5 43.3 42.9 41.8 42.4  MCV 97.3 97.7 97.1 97.2 97.9  PLT 265 276 245 251 [id number]   Basic Metabolic Panel:  Recent Labs Lab 04/13/16 1616 04/15/16 0620  NA 138 136  K 4.2 4.3  CL 102 101  CO2 28 24  GLUCOSE 147* 116*  BUN 17 16  CREATININE 1.42* 1.26*  CALCIUM 9.7 9.0  MG  --  2.2   GFR: Estimated Creatinine Clearance: 45.1 mL/min (by C-G formula based on SCr of 1.26 mg/dL (H)). Liver Function Tests:  Recent Labs Lab 04/13/16 1616    AST 20  ALT 15  ALKPHOS 99  BILITOT 0.8  PROT 7.3  ALBUMIN 4.2   No results for input(s): LIPASE, AMYLASE in the last 168 hours. No results for input(s): AMMONIA in the last 168 hours. Coagulation Profile:  Recent Labs Lab 04/13/16 1601 04/13/16 1616 04/15/16 0620 04/16/16 0330 04/17/16 0250  INR 1.6 1.39 1.25 1.07 1.03   Cardiac Enzymes: No results for input(s): CKTOTAL, CKMB, CKMBINDEX, TROPONINI in the last 168 hours. BNP (last 3 results) No results for input(s): PROBNP in the last 8760 hours. HbA1C: No results for input(s): HGBA1C in the last 72 hours. CBG: No results for input(s): GLUCAP in the last 168 hours. Lipid Profile: No results for input(s): CHOL, HDL, LDLCALC, TRIG, CHOLHDL, LDLDIRECT in the last 72 hours. Thyroid Function Tests:  Recent Labs  04/15/16 0620  TSH 45.129*   Anemia Panel: No results for input(s): VITAMINB12, FOLATE, FERRITIN, TIBC, IRON, RETICCTPCT in the last 72 hours. Urine analysis:    Component Value Date/Time   COLORURINE YELLOW 11/24/2008 0359   APPEARANCEUR CLEAR 11/24/2008 0359   LABSPEC 1.007 11/24/2008 0359   PHURINE 6.0 11/24/2008 0359   GLUCOSEU NEGATIVE 11/24/2008 0359   HGBUR MODERATE (A) 11/24/2008 0359   BILIRUBINUR NEGATIVE 11/24/2008 0359   KETONESUR NEGATIVE 11/24/2008 0359   PROTEINUR NEGATIVE 11/24/2008 0359   UROBILINOGEN 0.2 11/24/2008 0359   NITRITE NEGATIVE 11/24/2008 0359   LEUKOCYTESUR NEGATIVE 11/24/2008 0359   Sepsis Labs: @LABRCNTIP (procalcitonin:4,lacticidven:4)   ) Recent Results (from the past 240 hour(s))  CSF culture     Status: None (Preliminary result)   Collection Time: 04/16/16 11:57 AM  Result Value Ref Range Status   Specimen Description CSF  Final   Special Requests NONE  Final   Gram Stain   Final    CYTOSPIN SMEAR WBC PRESENT, PREDOMINANTLY MONONUCLEAR NO ORGANISMS SEEN    Culture NO GROWTH 1 DAY  Final   Report Status PENDING  Incomplete      Radiology Studies: Ct  Head Wo Contrast Result Date: 04/13/2016 1. No intracranial abnormality. Age related volume loss stable from the recent prior studies. 2. Sinus mucosal thickening, now only minor, improved from the recent prior exams.   Mr Angiogram Head Wo Contrast Result Date: 04/13/2016 MRI HEAD: Multiple subcentimeter foci of abnormal enhancement in the supra- and infratentorial brain predominately in a perivascular distribution most consistent with leptomeningitis. Though findings are often infectious or inflammatory, given given patient's history of breast cancer, neoplasm is a consideration. Recommend correlation with cerebral spinal fluid studies. MRA HEAD: Negative ; no typical findings of vasculopathy. MRA NECK: 50% stenosis RIGHT internal carotid artery origin by NASCET criteria.Mild vertebral artery regularity favoring atherosclerosis. Acute findings discussed with and reconfirmed by PA BOWIE TRAN on 04/13/2016 at 7: 40 pm.   Mr Angiogram Neck W Or Wo Contrast Result Date: 9/19/2017MRI HEAD: Multiple subcentimeter foci  of abnormal enhancement in the supra- and infratentorial brain predominately in a perivascular distribution most consistent with leptomeningitis. Though findings are often infectious or inflammatory, given given patient's history of breast cancer, neoplasm is a consideration. Recommend correlation with cerebral spinal fluid studies. MRA HEAD: Negative ; no typical findings of vasculopathy. MRA NECK: 50% stenosis RIGHT internal carotid artery origin by NASCET criteria.Mild vertebral artery regularity favoring atherosclerosis. Acute findings discussed with and reconfirmed by PA BOWIE TRAN on 04/13/2016 at 7: 40 pm.   Mr Jeri Cos Wo Contrast Result Date: 04/13/2016 MRI HEAD: Multiple subcentimeter foci of abnormal enhancement in the supra- and infratentorial brain predominately in a perivascular distribution most consistent with leptomeningitis. Though findings are often infectious or inflammatory,  given given patient's history of breast cancer, neoplasm is a consideration. Recommend correlation with cerebral spinal fluid studies. MRA HEAD: Negative ; no typical findings of vasculopathy. MRA NECK: 50% stenosis RIGHT internal carotid artery origin by NASCET criteria. Mild vertebral artery regularity favoring atherosclerosis. Acute findings discussed with and reconfirmed by PA BOWIE TRAN on 04/13/2016 at 7:40 pm.      Scheduled Meds: . aspirin EC  81 mg Oral q1800  . buPROPion  200 mg Oral BID WC  . HYDROcodone-acetaminophen  1 tablet Oral QHS  . levothyroxine  125 mcg Oral QAC breakfast  . magnesium oxide  400 mg Oral Daily  . methylPREDNISolone (SOLU-MEDROL) injection  500 mg Intravenous Daily  . metoprolol succinate  12.5 mg Oral BID  . pantoprazole  40 mg Oral Daily  . pravastatin  20 mg Oral Daily  . rOPINIRole  1 mg Oral BID   Continuous Infusions: . heparin 950 Units/hr (04/16/16 2200)     LOS: 2 days    Time spent: 25 minutes  Greater than 50% of the time spent on counseling and coordinating the care.   [name] Lenz, MD [facility] Pager [phone]  If 7PM-7AM, please contact night-coverage [email or web address] Password [email or web address] 04/17/2016, 11:29 AM

## 2016-04-18 DIAGNOSIS — M79605 Pain in left leg: Secondary | ICD-10-CM

## 2016-04-18 LAB — CBC
HCT: 41 % (ref 36.0–46.0)
Hemoglobin: 13.7 g/dL (ref 12.0–15.0)
MCH: 31.9 pg (ref 26.0–34.0)
MCHC: 33.4 g/dL (ref 30.0–36.0)
MCV: 95.3 fL (ref 78.0–100.0)
Platelets: 249 10*3/uL (ref 150–400)
RBC: 4.3 MIL/uL (ref 3.87–5.11)
RDW: 13.5 % (ref 11.5–15.5)
WBC: 13.3 10*3/uL — ABNORMAL HIGH (ref 4.0–10.5)

## 2016-04-18 LAB — C-REACTIVE PROTEIN: CRP: 1.3 mg/dL — ABNORMAL HIGH (ref <1.0)

## 2016-04-18 LAB — RPR: RPR Ser Ql: NONREACTIVE

## 2016-04-18 LAB — HEPARIN LEVEL (UNFRACTIONATED): Heparin Unfractionated: 0.61 IU/mL (ref 0.30–0.70)

## 2016-04-18 LAB — PROTIME-INR
INR: 1.02
Prothrombin Time: 13.4 seconds (ref 11.4–15.2)

## 2016-04-18 LAB — SEDIMENTATION RATE: Sed Rate: 11 mm/hr (ref 0–22)

## 2016-04-18 MED ORDER — HEPARIN (PORCINE) IN NACL 100-0.45 UNIT/ML-% IJ SOLN
850.0000 [IU]/h | INTRAMUSCULAR | Status: DC
  Administered 2016-04-18: 950 [IU]/h via INTRAVENOUS
  Administered 2016-04-20: 850 [IU]/h via INTRAVENOUS
  Filled 2016-04-18 (×2): qty 250

## 2016-04-18 MED ORDER — WARFARIN SODIUM 7.5 MG PO TABS
7.5000 mg | ORAL_TABLET | Freq: Once | ORAL | Status: AC
  Administered 2016-04-18: 7.5 mg via ORAL
  Filled 2016-04-18: qty 1

## 2016-04-18 MED ORDER — GABAPENTIN 600 MG PO TABS
300.0000 mg | ORAL_TABLET | Freq: Three times a day (TID) | ORAL | Status: DC
  Administered 2016-04-18 – 2016-04-20 (×7): 300 mg via ORAL
  Filled 2016-04-18 (×8): qty 1

## 2016-04-18 MED ORDER — WARFARIN - PHARMACIST DOSING INPATIENT
Freq: Every day | Status: DC
  Administered 2016-04-18: 19:00:00

## 2016-04-18 NOTE — Progress Notes (Signed)
Patient c/o left lower leg pain and numbness to touch this AM from calf downward. In comparison to right lower extremities, color noted same, pt voice that her pain on right leg is always there but new pain/numbness of left leg is new. Extremeties warm to touch, no redness noted. MD paged r/t to complaints. Awaiting for call back.   Ave Filter, RN

## 2016-04-18 NOTE — Progress Notes (Signed)
Subjective: Feels that her sensation on the left is improving, but also complains of left leg pain this am.   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 No signs of infection or symptoms of meningitis to suggest an infectious etiology. She is 17 years out from her breast cancer so I feel that this would be very unlikely to be related. I think that this most likely represents an inflammatory condition, possibly related to her autoimmune disease. Pending her studies, I would favor empiric steroids and have started this. She is having some improvement in her numbness with the steroids.   I wonder if some of her pain could be related to regaining sensation, and may benefit from some gabapentin.   Recommendations: 1) Pulse steroids, 500mg  daily for three days. Today is day 2 2) will decide on further therapy based on response to initial treatment.  3) If further decline, may need to consider further evaluation such as biopsy. 4) gabapentin 300mg  TID.   Roland Rack, MD [facility] [phone]  If 7pm- 7am, please page neurology on call as listed in [place].

## 2016-04-18 NOTE — Progress Notes (Addendum)
Patient ID: [name], female   DOB: [birth year], 70 y.o.   MRN: [id number]  PROGRESS NOTE    [name]  [id number] DOB: Dec 27, 1945 DOA: 04/13/2016  PCP: [name]   Brief Narrative:  19 -year-old female with past medical history significant for antiphospholipid antibody syndrome, on chronic anticoagulation with Coumadin, TIA, dyslipidemia and hypertension who presented to [facility] with left-sided numbness and blurred vision involving the right eye for about 24 hours prior to this admission. INR was subtherapeutic at 1.6. CT head showed no acute intracranial finding. 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 Neurology has seen her in consultation and plan is for lumbar puncture 04/16/2016.   Assessment & Plan:  Blurry vision / Leptomeningitis  - CT head showed no acute intracranial finding. 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 DME ordered.  Hypothyroidism - TSH 45, synthroid 112 mcg --> 125 mcg   Antiphospholipid antibody syndrome - Currently on heparin drip and start coumadin   Left leg pain - Obtain LE doppler - Per neuro, started gabapentin   Essential hypertension - Continue metoprolol 12.5 mg twice daily  Dyslipidemia - Continue pravastatin 20 mg daily   Depression - Continue wellbutrin.  History of hypomagnesemia -  On  chronic magnesium supplementation    DVT prophylaxis: Heparin and coumadin  Code Status: full code  Family Communication: No family at the bedside Disposition Plan: Home once cleared by neurology    Consultants:   Neurology, Dr. Leonie Man  Procedures:   2 D ECHO 9/19 - EF 50-55%, grade 1 DD  Antimicrobials:   None     Subjective: Has blurry and double vision.   Objective: Vitals:   04/17/16 2106 04/18/16 0106 04/18/16 0520 04/18/16 0843  BP: 122/71 114/70 102/62 133/81  Pulse: 83 84 86 94  Resp: 20 18 18 18   Temp: 97.5 F (36.4 C) 97.8 F (36.6 C) 98.3 F (36.8 C) 97.5 F (36.4 C)  TempSrc: Oral Oral Oral Oral  SpO2: 99% 96% 93%   Weight:      Height:        Intake/Output Summary (Last 24 hours) at 04/18/16 1402 Last data filed at 04/18/16 1000  Gross per 24 hour  Intake           843.68 ml  Output                0 ml  Net           843.68 ml   Filed Weights   04/13/16 2115  Weight: 81.6 kg (180 lb)    Examination:  General exam: No distress   Respiratory system: No wheezing, no rhonchi  Cardiovascular system: S1 & S2 heard, RRR Gastrointestinal system: (+) BS, non tender, non distended Central nervous system: Alert and oriented. Vision better with eye patch on  Extremities: No edema, left leg tenderness  Skin: warm and dry  Psychiatry: Mood & affect normal   Data Reviewed: I have personally reviewed following labs and imaging studies  CBC:  Recent Labs Lab 04/13/16 1616 04/14/16 0525 04/15/16 0620 04/16/16 0330 04/17/16 0250 04/18/16 0354  WBC 9.3 10.3 10.2 9.8 10.3 13.3*  NEUTROABS 6.2  --   --   --   --   --   HGB 14.1 13.8 13.9 13.5 13.4 13.7  HCT 43.5 43.3 42.9 41.8 42.4 41.0  MCV 97.3 97.7 97.1 97.2 97.9 95.3  PLT 265 276 245 251 277 0000000   Basic Metabolic Panel:  Recent Labs Lab 04/13/16 1616 04/15/16 0620  NA 138 136  K 4.2 4.3  CL 102 101  CO2 28 24  GLUCOSE 147* 116*  BUN 17 16  CREATININE 1.42* 1.26*  CALCIUM 9.7  9.0  MG  --  2.2   GFR: Estimated Creatinine Clearance: 45.1 mL/min (by C-G formula based on SCr of 1.26 mg/dL (H)). Liver Function Tests:  Recent Labs Lab 04/13/16 1616  AST 20  ALT 15  ALKPHOS 99  BILITOT 0.8  PROT 7.3  ALBUMIN 4.2   No results for input(s): LIPASE, AMYLASE in the last 168 hours. No results for input(s): AMMONIA in the last 168 hours. Coagulation Profile:  Recent Labs Lab 04/13/16 1616 04/15/16 0620 04/16/16 0330 04/17/16 0250 04/18/16 0354  INR 1.39 1.25 1.07 1.03 1.02   Cardiac Enzymes: No results for input(s): CKTOTAL, CKMB, CKMBINDEX, TROPONINI in the last 168 hours. BNP (last 3 results) No results for input(s): PROBNP in the last 8760 hours. HbA1C: No results for input(s): HGBA1C in the last 72 hours. CBG: No results for input(s): GLUCAP in the last 168 hours. Lipid Profile: No results for input(s): CHOL, HDL, LDLCALC, TRIG, CHOLHDL, LDLDIRECT in the last 72 hours. Thyroid Function Tests: No results for input(s): TSH, T4TOTAL, FREET4, T3FREE, THYROIDAB in the last 72 hours. Anemia Panel: No results for input(s): VITAMINB12, FOLATE, FERRITIN, TIBC, IRON, RETICCTPCT in the last 72 hours. Urine analysis:    Component Value Date/Time   COLORURINE YELLOW 11/24/2008 0359   APPEARANCEUR CLEAR 11/24/2008 0359   LABSPEC 1.007 11/24/2008 0359   PHURINE 6.0 11/24/2008 0359   GLUCOSEU NEGATIVE 11/24/2008 0359   HGBUR MODERATE (A) 11/24/2008 0359   BILIRUBINUR NEGATIVE 11/24/2008 0359   KETONESUR NEGATIVE 11/24/2008 0359   PROTEINUR NEGATIVE 11/24/2008 0359   UROBILINOGEN 0.2 11/24/2008 0359   NITRITE NEGATIVE 11/24/2008 0359   LEUKOCYTESUR NEGATIVE 11/24/2008 0359   Sepsis Labs: @LABRCNTIP (procalcitonin:4,lacticidven:4)   ) Recent Results (from the past 240 hour(s))  CSF culture     Status: None (Preliminary result)   Collection Time: 04/16/16 11:57 AM  Result Value Ref Range Status   Specimen Description CSF  Final   Special Requests  NONE  Final   Gram Stain   Final    CYTOSPIN SMEAR WBC PRESENT, PREDOMINANTLY MONONUCLEAR NO ORGANISMS SEEN    Culture NO GROWTH 2 DAYS  Final   Report Status PENDING  Incomplete      Radiology Studies: Ct Head Wo Contrast Result Date: 04/13/2016 1. No intracranial abnormality. Age related volume loss stable from the recent prior studies. 2. Sinus mucosal thickening, now only minor, improved from the recent prior exams.   Mr Angiogram Head Wo Contrast Result Date: 04/13/2016 MRI HEAD: Multiple subcentimeter foci of abnormal enhancement in the supra- and infratentorial brain predominately in a perivascular distribution most consistent with leptomeningitis. Though findings are often infectious or inflammatory, given given patient's history of breast cancer, neoplasm is a consideration. Recommend correlation with cerebral spinal fluid studies. MRA HEAD: Negative ; no typical findings of vasculopathy. MRA  NECK: 50% stenosis RIGHT internal carotid artery origin by NASCET criteria.Mild vertebral artery regularity favoring atherosclerosis. Acute findings discussed with and reconfirmed by PA BOWIE TRAN on 04/13/2016 at 7: 40 pm.   Mr Angiogram Neck W Or Wo Contrast Result Date: 9/19/2017MRI HEAD: Multiple subcentimeter foci of abnormal enhancement in the supra- and infratentorial brain predominately in a perivascular distribution most consistent with leptomeningitis. Though findings are often infectious or inflammatory, given given patient's history of breast cancer, neoplasm is a consideration. Recommend correlation with cerebral spinal fluid studies. MRA HEAD: Negative ; no typical findings of vasculopathy. MRA NECK: 50% stenosis RIGHT internal carotid artery origin by NASCET criteria.Mild vertebral artery regularity favoring atherosclerosis. Acute findings discussed with and reconfirmed by PA BOWIE TRAN on 04/13/2016 at 7: 40 pm.   Mr Jeri Cos Wo Contrast Result Date: 04/13/2016 MRI HEAD: Multiple  subcentimeter foci of abnormal enhancement in the supra- and infratentorial brain predominately in a perivascular distribution most consistent with leptomeningitis. Though findings are often infectious or inflammatory, given given patient's history of breast cancer, neoplasm is a consideration. Recommend correlation with cerebral spinal fluid studies. MRA HEAD: Negative ; no typical findings of vasculopathy. MRA NECK: 50% stenosis RIGHT internal carotid artery origin by NASCET criteria. Mild vertebral artery regularity favoring atherosclerosis. Acute findings discussed with and reconfirmed by PA BOWIE TRAN on 04/13/2016 at 7:40 pm.      Scheduled Meds: . aspirin EC  81 mg Oral q1800  . buPROPion  200 mg Oral BID WC  . gabapentin  300 mg Oral TID  . HYDROcodone-acetaminophen  1 tablet Oral QHS  . levothyroxine  125 mcg Oral QAC breakfast  . magnesium oxide  400 mg Oral Daily  . methylPREDNISolone (SOLU-MEDROL) injection  500 mg Intravenous Daily  . metoprolol succinate  12.5 mg Oral BID  . pantoprazole  40 mg Oral Daily  . pravastatin  20 mg Oral Daily  . rOPINIRole  1 mg Oral BID   Continuous Infusions: . heparin 950 Units/hr (04/17/16 1836)     LOS: 3 days    Time spent: 15 minutes  Greater than 50% of the time spent on counseling and coordinating the care.   Leisa Lenz, MD [facility] Pager [phone]  If 7PM-7AM, please contact night-coverage [email or web address] Password [email or web address] 04/18/2016, 2:02 PM

## 2016-04-18 NOTE — Progress Notes (Addendum)
ANTICOAGULATION CONSULT NOTE  Pharmacy Consult for Heparin Indication: antiphospholipid AB syndrome  Patient Measurements: Height: 5' 4.96" (165 cm) Weight: 180 lb (81.6 kg) IBW/kg (Calculated) : 56.91 Heparin Dosing Weight: 74  Vital Signs: Temp: 98.3 F (36.8 C) (09/24 0520) Temp Source: Oral (09/24 0520) BP: 102/62 (09/24 0520) Pulse Rate: 86 (09/24 0520)  Labs:  Recent Labs  04/15/16 1012  04/16/16 0330 04/16/16 2051 04/17/16 0250 04/18/16 0354  HGB  --   < > 13.5  --  13.4 13.7  HCT  --   --  41.8  --  42.4 41.0  PLT  --   --  251  --  277 249  APTT 103*  --   --   --   --   --   LABPROT  --   --  13.9  --  13.5 13.4  INR  --   --  1.07  --  1.03 1.02  HEPARINUNFRC  --   < >  --  0.31 0.47 0.61  < > = values in this interval not displayed.  Estimated Creatinine Clearance: 45.1 mL/min (by C-G formula based on SCr of 1.26 mg/dL (H)).  Assessment: 70 y.o. female with antiphospholipid antibody syndrome. PTA Coumadin on hold for LP which has been completed. Heparin level remains at goal on 950 units/hr. CBC is WNL and no bleeding noted.    Goal of Therapy:  Heparin level 0.3-0.7 units/ml Monitor platelets by anticoagulation protocol: Yes   Plan:  - Continue heparin at 950 units/hr - Daily heparin level and CBC - F/u restart coumadin  Salome Arnt, PharmD, BCPS Pager # [phone] 04/18/2016 8:40 AM   Addendum: Restarting warfarin  Plan: Warfarin 7.5mg  PO x 1 tonight Daily INR  Salome Arnt, PharmD, BCPS Pager # [phone] 04/18/2016 2:12 PM

## 2016-04-19 ENCOUNTER — Inpatient Hospital Stay (HOSPITAL_COMMUNITY): Payer: Medicare HMO

## 2016-04-19 DIAGNOSIS — A819 Atypical virus infection of central nervous system, unspecified: Secondary | ICD-10-CM

## 2016-04-19 DIAGNOSIS — M79609 Pain in unspecified limb: Secondary | ICD-10-CM

## 2016-04-19 DIAGNOSIS — M79605 Pain in left leg: Secondary | ICD-10-CM

## 2016-04-19 LAB — CBC
HCT: 37.4 % (ref 36.0–46.0)
Hemoglobin: 12.2 g/dL (ref 12.0–15.0)
MCH: 31.3 pg (ref 26.0–34.0)
MCHC: 32.6 g/dL (ref 30.0–36.0)
MCV: 95.9 fL (ref 78.0–100.0)
Platelets: 250 10*3/uL (ref 150–400)
RBC: 3.9 MIL/uL (ref 3.87–5.11)
RDW: 13.9 % (ref 11.5–15.5)
WBC: 25.1 10*3/uL — ABNORMAL HIGH (ref 4.0–10.5)

## 2016-04-19 LAB — HEPARIN LEVEL (UNFRACTIONATED): Heparin Unfractionated: 0.75 IU/mL — ABNORMAL HIGH (ref 0.30–0.70)

## 2016-04-19 LAB — PATHOLOGIST SMEAR REVIEW

## 2016-04-19 LAB — PROTIME-INR
INR: 1.03
Prothrombin Time: 13.5 seconds (ref 11.4–15.2)

## 2016-04-19 MED ORDER — ZOLPIDEM TARTRATE 5 MG PO TABS
5.0000 mg | ORAL_TABLET | Freq: Once | ORAL | Status: AC
  Administered 2016-04-19: 5 mg via ORAL
  Filled 2016-04-19: qty 1

## 2016-04-19 MED ORDER — WARFARIN SODIUM 7.5 MG PO TABS
7.5000 mg | ORAL_TABLET | Freq: Once | ORAL | Status: AC
  Administered 2016-04-19: 7.5 mg via ORAL
  Filled 2016-04-19: qty 1

## 2016-04-19 NOTE — Plan of Care (Signed)
Problem: Safety: Goal: Ability to remain free from injury will improve Outcome: Progressing Reminding client to allow nurse to help her to bathroom since has IV going.

## 2016-04-19 NOTE — Care Management Important Message (Signed)
Important Message  Patient Details  Name: [name] MRN: [id number] Date of Birth: 1945/10/20   Medicare Important Message Given:  Yes    [name] 04/19/2016, 11:41 AM

## 2016-04-19 NOTE — Care Management Note (Signed)
Case Management Note  Patient Details  Name: [name] MRN: [id number] Date of Birth: July 26, 1946  Subjective/Objective:                    Action/Plan: Plan is home when medically ready. PT/OT recommending outpatient therapy. CM following for d/c needs.   Expected Discharge Date:                  Expected Discharge Plan:     In-House Referral:     Discharge planning Services     Post Acute Care Choice:    Choice offered to:     DME Arranged:    DME Agency:     HH Arranged:    HH Agency:     Status of Service:     If discussed at [employer], dates discussed:    Additional Comments:  Pollie Friar, RN 04/19/2016, 11:26 AM

## 2016-04-19 NOTE — Progress Notes (Signed)
Dr. Nicole Kindred notified of pts c/o left facial numbness around ear with decreased hearing. No other neuro changes noted. No new orders received, will continue to monitor pt

## 2016-04-19 NOTE — Progress Notes (Signed)
**  Preliminary report by tech**  Left lower extremity venous duplex completed. There is no evidence of deep or superficial vein thrombosis involving the left lower extremity. All visualized vessels appear patent and compressible. There is no evidence of a Baker's cyst on the left.   04/19/16 6:05 PM [name] RVT

## 2016-04-19 NOTE — Progress Notes (Signed)
Hematology: 70 year old woman well-known to me who called our office today to let us know she was admitted to the hospital last week. I initially saw her in October 1998 when she had a arterial embolus to one of the fingers on her left hand as the first sign of antiphospholipid antibody syndrome. She has been maintained on chronic Coumadin anticoagulation and has had no subsequent thrombotic events. She was diagnosed with stage I, estrogen receptor positive, cancer of the right breast in December 1999 treated with lumpectomy, radiation, and hormonal therapy with tamoxifen. She is status post thyroidectomy. She has had Nissen fundoplication procedures 2 most recently May 2014 for hiatus hernia. She has chronic anxiety and depression. She has never really developed signs or symptoms of a collagen vascular disorder. She has had a number of laboratory evaluations over the last year. On 10/06/2015 ANCA panel negative, ANA 1:80, rheumatoid factor negative, serum protein electrophoresis with no monoclonal spike. On 03/05/2016: Anti-double-stranded DNA antibodies undetectable. C3 complement normal C4 complement mildly elevated , ESR 7 mm. I have not checked levels of antiphospholipid antibodies in many years. She had a RPR test for syphilis on September 22 during this admission and it is negative. RPR frequently false positive in patients with antiphospholipid antibody syndrome.  She has chronic headaches. She has had numerous CT scans of the head over the last 7 years primarily for severe headache. A scan done 11/24/2008 for left-sided weakness and numbness was negative. MRI an MRA also negative. CT head 07/21/2009 to evaluate right-sided headache and blurry vision was negative. CT head 03/05/12 to evaluate left-sided headache was negative. CT 02/17/2016 to evaluate headache and syncope was negative. MRI of the brain showed scattered small remote infarcts versus chronic microvascular change. No acute  findings.  She presented at the time of the current admission on September 19 with a one-week history of progressive neurologic changes. One week ago Sunday when driving home from the airport she had a acute episode of nausea and vomiting. Over the next 24 hours she developed pain and numbness in her left calf. She then developed left arm and left facial numbness and then diplopia and pain on motion of her right eye. She had a recurrent right sided headache. She denied any myalgias, arthralgias, fever, or infectious exposures. She has had some tick bites in the last year. Once again a CT scan of the head was unremarkable. 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  Initial neurologic exam by Dr. Nicole Kindred reported normal extraocular movement with no disconjugate gaze. Decreased sensation on the left side of the face with no facial weakness. No motor deficits. Normal sensory exam.  On my exam today: Absent sensation to touch left face and left leg from knee down. Disconjugate gaze with a right lateral sixth nerve palsy. Right pupil 1 mm larger than left both reactive. Upper body coordination slow but accurate. Rapid alternating movements normal. No other deficits detected. No cervical supraclavicular or axillary lymphadenopathy. Atrophic and surgical changes in the right breast without a dominant mass in either breast.  Preliminary results of spinal fluid showed normal total protein of 40 mg percent, total white count 12 with 98% lymphocytes. Clear fluid. Pathology review: Cells appear inflammatory and not malignant but I do not see that a formal cytology evaluation was requested.  Additional pertinent lab: Prior to high-dose steroids on September 19: Hemoglobin 14, hematocrit 43.5, MCV 97, white  count 9300 with 66% neutrophils, 27 lymphocytes, 4 monocytes, platelet count  265,000.  Recent chest x-ray 02/20/2016 negative. Most recent mammograms 08/12/2015 no suspicious areas.  Impression: Best fit diagnosis at present is an atypical viral infection. Ancillary studies on the CSF are still outstanding with respect to oligoclonal bands and Lyme titers. No gross suspicion for metastatic recurrence of breast cancer with normal CSF protein and no malignant appearing cells on preliminary review of the CSF. However, I remain concerned that she developed progressive focal neurologic deficits including right 6 no palsy first noted by a neurologist on September 21. She completes an empiric course of high-dose steroids today. I am less inclined to believe that this is a manifestation of a collagen vascular disease given extensive negative serologic workup done earlier this year. Despite subtherapeutic INR on admission, this does not appear to be a thromboembolic event. I would continue her full dose anticoagulation.   Murriel Hopper, MD, [place]  Hematology-Oncology/Internal Medicine

## 2016-04-19 NOTE — Progress Notes (Signed)
Patient ID: [name], female   DOB: 04/17/1946, 70 y.o.   MRN: [id number]  PROGRESS NOTE    [name]  [id number] DOB: 1946-04-04 DOA: 04/13/2016  PCP: [name]   Brief Narrative:  70 -year-old female with past medical history significant for antiphospholipid antibody syndrome, on chronic anticoagulation with Coumadin, TIA, dyslipidemia and hypertension who presented to [facility] with left-sided numbness and blurred vision involving the right eye for about 24 hours prior to this admission. INR was subtherapeutic at 1.6. CT head showed no acute intracranial finding. 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 Neurology has seen her in consultation and plan is for lumbar puncture 04/16/2016.   Assessment & Plan:  Blurry vision / Leptomeningitis  - CT head showed no acute intracranial finding. 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 2-D echo without cardioembolic source - LP results with 12 WBC and lymphocyte predominant;  - Labs pending: CSF oligoclonal bands, lyme titer, ACE, cytology  - Continue steroids per neurology (solumedrol 500 mg IV daily): Day 3 of 3.  - Continue aspirin 81 mg daily - Outpatient physical therapy recommended. DME ordered.  Hypothyroidism: Likely subtherapeutic replacement.  - TSH 45, synthroid 112 mcg --> 125 mcg   Antiphospholipid antibody syndrome - Currently on heparin drip and restarted coumadin   Left leg pain - Obtain LE doppler - Per neuro, started gabapentin   Essential hypertension - Continue metoprolol 12.5 mg twice daily  Dyslipidemia - Continue pravastatin 20 mg daily    Depression - Continue wellbutrin.  History of hypomagnesemia -  On chronic magnesium supplementation   DVT prophylaxis: Heparin and coumadin  Code Status: full code  Family Communication: Mother, and family friends at bedside (pt wanted them to stay during visit) Disposition Plan: Home once cleared by neurology   Consultants:   Neurology, Dr. Leonie Man  Procedures:   2 D ECHO 9/19 - EF 50-55%, grade 1 DD  Antimicrobials:   None    Subjective: Has blurry and double vision unchanged from previously. Left leg/hand/arm/ear are numb and intermittently painful.   Objective: Vitals:   04/19/16 0032 04/19/16 0530 04/19/16 0914 04/19/16 1400  BP: (!) 92/50 (!) 100/58 132/75 138/70  Pulse: 84 68 71 72  Resp: 18 20 20 20   Temp: 98.5 F (36.9 C) 98 F (36.7 C) 97.8 F (36.6 C) 98.1 F (36.7 C)  TempSrc: Oral Oral Oral Oral  SpO2: 96% 98% 100% 97%  Weight:      Height:        Intake/Output Summary (Last 24 hours) at 04/19/16 1414 Last data filed at 04/19/16 0908  Gross per 24 hour  Intake           385.51 ml  Output                0 ml  Net           385.51 ml   Filed Weights   04/13/16 2115  Weight: 81.6 kg (180 lb)    Examination:  General exam: No distress   Respiratory system: No wheezing, no rhonchi  Cardiovascular system: S1 & S2 heard, RRR Gastrointestinal system: (+) BS, non tender, non distended Central nervous system: Alert and oriented. Vision better with eye patch on  Extremities:  No edema, left leg tenderness  Skin: warm and dry  Psychiatry: Mood & affect normal   Data Reviewed: I have personally reviewed following labs and imaging studies  CBC:  Recent Labs Lab 04/13/16 1616  04/15/16 0620 04/16/16 0330 04/17/16 0250 04/18/16 0354 04/19/16 0724  WBC 9.3  < > 10.2 9.8 10.3 13.3* 25.1*  NEUTROABS 6.2  --   --   --   --   --   --   HGB 14.1  < > 13.9 13.5 13.4 13.7 12.2  HCT 43.5  < > 42.9 41.8 42.4 41.0 37.4  MCV 97.3  < > 97.1 97.2  97.9 95.3 95.9  PLT 265  < > 245 251 277 249 250  < > = values in this interval not displayed. Basic Metabolic Panel:  Recent Labs Lab 04/13/16 1616 04/15/16 0620  NA 138 136  K 4.2 4.3  CL 102 101  CO2 28 24  GLUCOSE 147* 116*  BUN 17 16  CREATININE 1.42* 1.26*  CALCIUM 9.7 9.0  MG  --  2.2   GFR: Estimated Creatinine Clearance: 45.1 mL/min (by C-G formula based on SCr of 1.26 mg/dL (H)). Liver Function Tests:  Recent Labs Lab 04/13/16 1616  AST 20  ALT 15  ALKPHOS 99  BILITOT 0.8  PROT 7.3  ALBUMIN 4.2   No results for input(s): LIPASE, AMYLASE in the last 168 hours. No results for input(s): AMMONIA in the last 168 hours. Coagulation Profile:  Recent Labs Lab 04/15/16 0620 04/16/16 0330 04/17/16 0250 04/18/16 0354 04/19/16 0724  INR 1.25 1.07 1.03 1.02 1.03   Cardiac Enzymes: No results for input(s): CKTOTAL, CKMB, CKMBINDEX, TROPONINI in the last 168 hours. BNP (last 3 results) No results for input(s): PROBNP in the last 8760 hours. HbA1C: No results for input(s): HGBA1C in the last 72 hours. CBG: No results for input(s): GLUCAP in the last 168 hours. Lipid Profile: No results for input(s): CHOL, HDL, LDLCALC, TRIG, CHOLHDL, LDLDIRECT in the last 72 hours. Thyroid Function Tests: No results for input(s): TSH, T4TOTAL, FREET4, T3FREE, THYROIDAB in the last 72 hours. Anemia Panel: No results for input(s): VITAMINB12, FOLATE, FERRITIN, TIBC, IRON, RETICCTPCT in the last 72 hours. Urine analysis:    Component Value Date/Time   COLORURINE YELLOW 11/24/2008 0359   APPEARANCEUR CLEAR 11/24/2008 0359   LABSPEC 1.007 11/24/2008 0359   PHURINE 6.0 11/24/2008 0359   GLUCOSEU NEGATIVE 11/24/2008 0359   HGBUR MODERATE (A) 11/24/2008 0359   BILIRUBINUR NEGATIVE 11/24/2008 0359   KETONESUR NEGATIVE 11/24/2008 0359   PROTEINUR NEGATIVE 11/24/2008 0359   UROBILINOGEN 0.2 11/24/2008 0359   NITRITE NEGATIVE 11/24/2008 0359   LEUKOCYTESUR NEGATIVE 11/24/2008  0359   Sepsis Labs: @LABRCNTIP (procalcitonin:4,lacticidven:4)   ) Recent Results (from the past 240 hour(s))  CSF culture     Status: None (Preliminary result)   Collection Time: 04/16/16 11:57 AM  Result Value Ref Range Status   Specimen Description CSF  Final   Special Requests NONE  Final   Gram Stain   Final    CYTOSPIN SMEAR WBC PRESENT, PREDOMINANTLY MONONUCLEAR NO ORGANISMS SEEN    Culture NO GROWTH 3 DAYS  Final   Report Status PENDING  Incomplete      Radiology Studies: Ct Head Wo Contrast Result Date: 04/13/2016 1. No intracranial abnormality. Age related volume loss stable from the recent prior studies. 2. Sinus mucosal thickening, now only minor, improved from the recent prior exams.   Mr Angiogram Head Wo Contrast Result Date:  04/13/2016 MRI HEAD: Multiple subcentimeter foci of abnormal enhancement in the supra- and infratentorial brain predominately in a perivascular distribution most consistent with leptomeningitis. Though findings are often infectious or inflammatory, given given patient's history of breast cancer, neoplasm is a consideration. Recommend correlation with cerebral spinal fluid studies. MRA HEAD: Negative ; no typical findings of vasculopathy. MRA NECK: 50% stenosis RIGHT internal carotid artery origin by NASCET criteria.Mild vertebral artery regularity favoring atherosclerosis. Acute findings discussed with and reconfirmed by PA BOWIE TRAN on 04/13/2016 at 7: 40 pm.   Mr Angiogram Neck W Or Wo Contrast Result Date: 9/19/2017MRI HEAD: Multiple subcentimeter foci of abnormal enhancement in the supra- and infratentorial brain predominately in a perivascular distribution most consistent with leptomeningitis. Though findings are often infectious or inflammatory, given given patient's history of breast cancer, neoplasm is a consideration. Recommend correlation with cerebral spinal fluid studies. MRA HEAD: Negative ; no typical findings of vasculopathy. MRA NECK:  50% stenosis RIGHT internal carotid artery origin by NASCET criteria.Mild vertebral artery regularity favoring atherosclerosis. Acute findings discussed with and reconfirmed by PA BOWIE TRAN on 04/13/2016 at 7: 40 pm.   Mr Jeri Cos Wo Contrast Result Date: 04/13/2016 MRI HEAD: Multiple subcentimeter foci of abnormal enhancement in the supra- and infratentorial brain predominately in a perivascular distribution most consistent with leptomeningitis. Though findings are often infectious or inflammatory, given given patient's history of breast cancer, neoplasm is a consideration. Recommend correlation with cerebral spinal fluid studies. MRA HEAD: Negative ; no typical findings of vasculopathy. MRA NECK: 50% stenosis RIGHT internal carotid artery origin by NASCET criteria. Mild vertebral artery regularity favoring atherosclerosis. Acute findings discussed with and reconfirmed by PA BOWIE TRAN on 04/13/2016 at 7:40 pm.      Scheduled Meds: . aspirin EC  81 mg Oral q1800  . buPROPion  200 mg Oral BID WC  . gabapentin  300 mg Oral TID  . HYDROcodone-acetaminophen  1 tablet Oral QHS  . levothyroxine  125 mcg Oral QAC breakfast  . magnesium oxide  400 mg Oral Daily  . metoprolol succinate  12.5 mg Oral BID  . pantoprazole  40 mg Oral Daily  . pravastatin  20 mg Oral Daily  . rOPINIRole  1 mg Oral BID  . warfarin  7.5 mg Oral ONCE-1800  . Warfarin - Pharmacist Dosing Inpatient   Does not apply q1800   Continuous Infusions: . heparin 950 Units/hr (04/18/16 2119)    LOS: 4 days   Time spent: 25 minutes  Greater than 50% of the time spent on counseling and coordinating the care.  Vance Gather, MD [facility] Pager [phone]  If 7PM-7AM, please contact night-coverage [email or web address] Password [facility] 04/19/2016, 2:14 PM

## 2016-04-19 NOTE — Progress Notes (Signed)
ANTICOAGULATION CONSULT NOTE - Follow Up Consult  Pharmacy Consult for Heparin/Coumadin Indication: Antiphospholipid Antibody Syndrome  Allergies  Allergen Reactions  . Bee Venom Anaphylaxis  . Hornet Venom Anaphylaxis    Anaphylaxis Shock SHOCK  . Reglan [Metoclopramide] Shortness Of Breath and Other (See Comments)    Tongue got numb; didn't feel good  . Vitamin K Anaphylaxis and Rash    "kills me" iv  . Vitamin K And Related Anaphylaxis  . Benadryl [Diphenhydramine Hcl]     Rash  . Crestor [Rosuvastatin Calcium]     Muscle Aches  . Doxycycline     Tongue burning, thrush  . Levofloxacin     Rash  . Zocor [Simvastatin]     Muscle Aches  . Ciprofloxacin Rash  . Daypro [Oxaprozin] Rash    Rash  . Levofloxacin Rash  . Phytonadione Rash  . Sulfonamide Derivatives Rash    Patient Measurements: Height: 5' 4.96" (165 cm) Weight: 180 lb (81.6 kg) IBW/kg (Calculated) : 56.91 Heparin Dosing Weight: 74 kg  Vital Signs: Temp: 97.8 F (36.6 C) (09/25 0914) Temp Source: Oral (09/25 0914) BP: 132/75 (09/25 0914) Pulse Rate: 71 (09/25 0914)  Labs:  Recent Labs  04/17/16 0250 04/18/16 0354 04/19/16 0724  HGB 13.4 13.7 12.2  HCT 42.4 41.0 37.4  PLT 277 249 250  LABPROT 13.5 13.4 13.5  INR 1.03 1.02 1.03  HEPARINUNFRC 0.47 0.61 0.75*    Estimated Creatinine Clearance: 45.1 mL/min (by C-G formula based on SCr of 1.26 mg/dL (H)).   Assessment:  Anticoag: Heparin/warfarin for hx antiphospholipid - HL 0.75, INR 1.03 unchanged, H/H 12.2, plts 250 stable  - PTA Coumadin: 5 mg daily except 2.5 mg on Thursdays  Goal of Therapy:  Heparin level 0.3-0.7 units/ml  INR 2-3 Monitor platelets by anticoagulation protocol: Yes   Plan:  - Decrease heparin gtt to  850 units/hr - Repeat Coumadin 7.5mg  po x 1 tonight - Daily HL and CBC, INR    Tanaysha Alkins S. Alford Highland, PharmD, BCPS Clinical Staff Pharmacist Pager [phone]  Eilene Ghazi Stillinger 04/19/2016,9:46 AM

## 2016-04-19 NOTE — Progress Notes (Signed)
Physical Therapy Treatment Patient Details Name: [name] MRN: [id number] DOB: [birth year] Today's Date: 04/19/2016    History of Present Illness 70 y.o. female with medical history significant of antiphospholipid AB syndrome, chronic anticoagulation with coumadin, TIA, HLD, HTN.  Patient presents to the ED with c/o left sided numbness and blurred vision involving R eye. CT and MRI on 9/19 negative for acute infarct.    PT Comments    Patient progressing with mobility and balance.  Still high fall risk and needing UE support to compensate for sensory deficits.  Will need ongoing outpatient PT at d/c.   Follow Up Recommendations  Outpatient PT;Supervision/Assistance - 24 hour     Equipment Recommendations  None recommended by PT    Recommendations for Other Services       Precautions / Restrictions Precautions Precautions: Fall Precaution Comments: vision changes    Mobility  Bed Mobility Overal bed mobility: Independent                Transfers Overall transfer level: Needs assistance Equipment used: Rolling walker (2 wheeled) Transfers: Sit to/from Stand Sit to Stand: Min guard         General transfer comment: for safety due to unsteady  Ambulation/Gait Ambulation/Gait assistance: Min guard Ambulation Distance (Feet): 200 Feet Assistive device: Rolling walker (2 wheeled);4-wheeled walker Gait Pattern/deviations: Step-through pattern;Decreased stride length;Wide base of support;Drifts right/left;Decreased step length - left;Decreased dorsiflexion - left     General Gait Details: steady assist, initially with some wobble with standard RW, then with 4WW occasional scuffing L toes on floor, but no LOB.  Cues to look straight ahead at stationary object and to close one eye if needed (refused to wear eye patch)   Stairs Stairs: Yes Stairs assistance: Supervision Stair Management: Step to pattern;Two rails;Forwards Number of Stairs: 4 General stair  comments: instinctively used her R foot to feel the depth of the step prior to stepping up or down.  Educated this is good strategy as she cannot rely on visual feedback for negotiating steps or curbs.   Wheelchair Mobility    Modified Rankin (Stroke Patients Only)       Balance Overall balance assessment: Needs assistance   Sitting balance-Leahy Scale: Good     Standing balance support: No upper extremity supported Standing balance-Leahy Scale: Fair Standing balance comment: static standing without LOB, but needs UE support for any dynamic tasks                    Cognition Arousal/Alertness: Awake/alert Behavior During Therapy: WFL for tasks assessed/performed Overall Cognitive Status: Within Functional Limits for tasks assessed                      Exercises      General Comments General comments (skin integrity, edema, etc.): Educated in importance of walker use even in the home.  Due to both visual and somatosensory deficits patient with high fall risk.      Pertinent Vitals/Pain Pain Assessment: Faces Faces Pain Scale: Hurts little more Pain Location: L LE with ambulation Pain Descriptors / Indicators: Aching;Numbness Pain Intervention(s): Limited activity within patient's tolerance;Monitored during session;Repositioned    Home Living                      Prior Function            PT Goals (current goals can now be found in the care plan section) Progress towards PT  goals: Progressing toward goals    Frequency    Min 4X/week      PT Plan Current plan remains appropriate    Co-evaluation             End of Session Equipment Utilized During Treatment: Gait belt Activity Tolerance: Patient tolerated treatment well Patient left: in bed;with call bell/phone within reach;with family/visitor present;with bed alarm set     Time: 1234-1250 PT Time Calculation (min) (ACUTE ONLY): 16 min  Charges:  $Gait Training: 8-22  mins                    G Codes:      Reginia Naas 04-22-16, 1:36 PM  Magda Kiel, [place] 04-22-2016

## 2016-04-19 NOTE — Discharge Instructions (Signed)

## 2016-04-20 ENCOUNTER — Encounter: Payer: Self-pay | Admitting: Pharmacist

## 2016-04-20 DIAGNOSIS — R208 Other disturbances of skin sensation: Secondary | ICD-10-CM

## 2016-04-20 DIAGNOSIS — G030 Nonpyogenic meningitis: Secondary | ICD-10-CM

## 2016-04-20 DIAGNOSIS — H4921 Sixth [abducent] nerve palsy, right eye: Secondary | ICD-10-CM

## 2016-04-20 LAB — CBC
HCT: 35.7 % — ABNORMAL LOW (ref 36.0–46.0)
Hemoglobin: 11.5 g/dL — ABNORMAL LOW (ref 12.0–15.0)
MCH: 31.1 pg (ref 26.0–34.0)
MCHC: 32.2 g/dL (ref 30.0–36.0)
MCV: 96.5 fL (ref 78.0–100.0)
Platelets: 243 10*3/uL (ref 150–400)
RBC: 3.7 MIL/uL — ABNORMAL LOW (ref 3.87–5.11)
RDW: 14 % (ref 11.5–15.5)
WBC: 18.8 10*3/uL — ABNORMAL HIGH (ref 4.0–10.5)

## 2016-04-20 LAB — CSF CULTURE: Culture: NO GROWTH

## 2016-04-20 LAB — PROTIME-INR
INR: 1.42
Prothrombin Time: 17.5 seconds — ABNORMAL HIGH (ref 11.4–15.2)

## 2016-04-20 LAB — HEPARIN LEVEL (UNFRACTIONATED): Heparin Unfractionated: 0.5 IU/mL (ref 0.30–0.70)

## 2016-04-20 LAB — CSF CULTURE W GRAM STAIN

## 2016-04-20 MED ORDER — ZOLPIDEM TARTRATE 5 MG PO TABS: 5.0000 mg | ORAL_TABLET | Freq: Every evening | ORAL | Status: DC | PRN

## 2016-04-20 MED ORDER — ENOXAPARIN SODIUM 120 MG/0.8ML ~~LOC~~ SOLN: 120.0000 mg | SUBCUTANEOUS | Status: DC

## 2016-04-20 MED ORDER — WARFARIN SODIUM 7.5 MG PO TABS
7.5000 mg | ORAL_TABLET | Freq: Once | ORAL | Status: AC
  Administered 2016-04-20: 7.5 mg via ORAL
  Filled 2016-04-20: qty 1

## 2016-04-20 NOTE — Progress Notes (Signed)
I have reviewed Dr. Julianne Rice note.  Patient is on [facility] and INR low.  Dose of coumadin increased.

## 2016-04-20 NOTE — Progress Notes (Signed)
Neurology Progress Note  Subjective: The patient continues to have "triple" vision with right gaze. The numbness in the left lower leg and around her left ear is unchanged. She feels as if the numbness in her left arm is better but still experiences what she describes as "cold spots" along the ulnar aspect of the forearm. She has not slept well while in the hospital and says that last night was the first night she has gotten any decent rest after she was given Ambien by cross-cover. She is completing her steroid burst today and has tolerated this for the most part, though has had some sweating with her steroids. She is eager to go home.   Current Meds:   Current Facility-Administered Medications:  .  acetaminophen (TYLENOL) tablet 650 mg, 650 mg, Oral, Q4H PRN, Rhetta Mura Schorr, NP, 650 mg at 04/17/16 0452 .  ALPRAZolam [name]) tablet 0.5 mg, 0.5 mg, Oral, BID PRN, Etta Quill, DO, 0.5 mg at 04/19/16 2206 .  aspirin EC tablet 81 mg, 81 mg, Oral, q1800, Etta Quill, DO, 81 mg at 04/19/16 1727 .  buPROPion [facility] SR) 12 hr tablet 200 mg, 200 mg, Oral, BID WC, Etta Quill, DO, 200 mg at 04/19/16 1725 .  fluticasone (FLONASE) 50 MCG/ACT nasal spray 2 spray, 2 spray, Each Nare, Daily PRN, Etta Quill, DO .  gabapentin (NEURONTIN) tablet 300 mg, 300 mg, Oral, TID, Greta Doom, MD, 300 mg at 04/19/16 2204 .  heparin ADULT infusion 100 units/mL (25000 units/236m sodium chloride 0.45%), 850 Units/hr, Intravenous, Continuous, Crystal STrellis Moment RPH, Last Rate: 8.5 mL/hr at 04/20/16 0158, 850 Units/hr at 04/20/16 0158 .  HYDROcodone-acetaminophen (NORCO/VICODIN) 5-325 MG per tablet 1 tablet, 1 tablet, Oral, QHS, JEtta Quill DO, 1 tablet at 04/19/16 2205 .  levothyroxine (SYNTHROID, LEVOTHROID) tablet 125 mcg, 125 mcg, Oral, QAC breakfast, ARobbie Lis MD, 125 mcg at 04/19/16 0935 .  magnesium oxide (MAG-OX) tablet 400 mg, 400 mg, Oral, Daily, JEtta Quill DO, 400  mg at 04/19/16 00867.  meclizine (ANTIVERT) tablet 25 mg, 25 mg, Oral, TID PRN, JEtta Quill DO .  metoprolol succinate (TOPROL-XL) 24 hr tablet 12.5 mg, 12.5 mg, Oral, BID, JEtta Quill DO, 12.5 mg at 04/19/16 2203 .  pantoprazole (PROTONIX) EC tablet 40 mg, 40 mg, Oral, Daily, JEtta Quill DO, 40 mg at 04/19/16 06195.  pravastatin (PRAVACHOL) tablet 20 mg, 20 mg, Oral, Daily, JEtta Quill DO, 20 mg at 04/19/16 00932.  rOPINIRole (REQUIP) tablet 1 mg, 1 mg, Oral, BID, JEtta Quill DO, 1 mg at 04/19/16 2202 .  Warfarin - Pharmacist Dosing Inpatient, , Does not apply, q1800, RValeda MalmRumbarger, RPH  Objective:  Temp:  [97.8 F (36.6 C)-99 F (37.2 C)] 97.9 F (36.6 C) (09/26 0532) Pulse Rate:  [62-82] 62 (09/26 0532) Resp:  [18-20] 18 (09/26 0532) BP: (107-138)/(58-86) 107/58 (09/26 0532) SpO2:  [95 %-100 %] 96 % (09/26 0532)  General: WDWN sitting up in bed eating breakfast. She is in NAD. Alert, oriented x4. Speech is clear without dysarthria. Affect is bright. Comportment is normal.  HEENT: Neck is supple without lymphadenopathy. Mucous membranes are moist and the oropharynx is clear. Sclerae are anicteric. There is no conjunctival injection.  CV: Regular, no murmur. Carotid pulses are 2+ and symmetric with no bruits. Distal pulses 2+ and symmetric.  Lungs: CTAB  Extremities: No C/C/E. Neuro: MS: As noted above. No aphasia.  CN: Pupils  are equal and reactive from 3-->2 mm bilaterally. She has a R CN VI palsy. She has decreased sensation around the left ear with facial sensation otherwise intact. Face is symmetric at rest with normal strength and mobility. Hearing is intact to conversational voice. Voice is normal in tone and quality. Palate elevates symmetrically. Uvula is midline. Bilateral SCM and trapezii are 5/5. Tongue is midline with normal bulk and mobility.  Motor: Normal bulk, tone, and strength throughout. No pronator drift. No tremor or other abnormal  movements are observed.  Sensation: Pinprick is decreased in a patchy manner on the left forearm. She has reduced pinprick in the LLE from about mid-thigh down.  DTRs: 2+, symmetric. Toes are downgoing bilaterally. No pathological reflexes.  Coordination: Finger-to-nose and heel-to-shin are without dysmetria bilaterally.    Labs: Lab Results  Component Value Date   WBC 18.8 (H) 04/20/2016   HGB 11.5 (L) 04/20/2016   HCT 35.7 (L) 04/20/2016   PLT 243 04/20/2016   GLUCOSE 116 (H) 04/15/2016   CHOL 204 (H) 04/14/2016   TRIG 231 (H) 04/14/2016   HDL 40 (L) 04/14/2016   LDLCALC 118 (H) 04/14/2016   ALT 15 04/13/2016   AST 20 04/13/2016   NA 136 04/15/2016   K 4.3 04/15/2016   CL 101 04/15/2016   CREATININE 1.26 (H) 04/15/2016   BUN 16 04/15/2016   CO2 24 04/15/2016   TSH 45.129 (H) 04/15/2016   PSA (L) 07/21/2009    <0.01 (NOTE) Result repeated and verified. Test Methodology: [employer] PSA   INR 1.42 04/20/2016   HGBA1C 6.0 (H) 04/14/2016   CBC Latest Ref Rng & Units 04/20/2016 04/19/2016 04/18/2016  WBC 4.0 - 10.5 K/uL 18.8(H) 25.1(H) 13.3(H)  Hemoglobin 12.0 - 15.0 g/dL 11.5(L) 12.2 13.7  Hematocrit 36.0 - 46.0 % 35.7(L) 37.4 41.0  Platelets 150 - 400 K/uL 243 250 249    Lab Results  Component Value Date   HGBA1C 6.0 (H) 04/14/2016   Lab Results  Component Value Date   ALT 15 04/13/2016   AST 20 04/13/2016   ALKPHOS 99 04/13/2016   BILITOT 0.8 04/13/2016   CRP 1.3 ESR 11 CSF fungal cx pending CSF Lyme titers pending CSF oligoclonal bands pending CSF ACE pending CSF cytology pending  From 10/06/15: ANCA panel negative ANA 1:80 RF negative SPEP negative  From 03/05/16:  Anti-dsDNA abs negative C3 complement normal C4 complement normal ESR 7  A/P:   1. Aseptic meningitis: I would agree that at this point the most likely etiology is viral given negative autoimmune workup over the past few months. Paraneoplastic disease remains possible though less likely  given remote history of cancer. Several CSF studies are pending, though I anticipate they will be unrevealing. She is to complete steroids today. I would favor no further steroids once this burst is completed with repeat MRI brain with and without contrast in 3-4 weeks to reassess.   2. LLE numbness:  She has patchy numbness that does not conform to any particular peripheral pattern and thus does not localize well. This is not clearly a mononeuritis multiplex picture, polyradiculopathy, or plexopathy based on exam. It may be more central in etiology, related to her meningeal process. She noted some slight improvement with steroids but this has not been dramatic. Continue PT.   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 This is largely unchanged thus far. Continue symptomatic treatment with alternating eye patching.   This was discussed with the patient and  she is in agreement with the plan as noted. She was given the opportunity to ask questions and these were addressed to her satisfaction.   Melba Coon, MD [facility]

## 2016-04-20 NOTE — Progress Notes (Signed)
Pt discharging home with family. CM consulted for outpatient therapy per [name]. CM met with the patient and asked about outpatient therapy at [facility]. She is interested but per her son she may be in [place] over the next few weeks. They have a facility up there they are interested in attending. [name] made aware and prescription given to patients son for outpatient therapy. CM will also place order for Neurorehab in case patient stays in town. Pt also discharging on lovenox/ coumadin bridge. CM spoke with the patient and she has given herself lovenox in the past. She uses [name] office for her coumadin checks. CM called and spoke with [name] in [name] office and arranged for her to have a coumadin check tomorrow. [name] also brought her several days worth of lovenox to get her started.  [name] also wanted pt to have Neurology f/u. Pt wanted [name] to recommend someone. CM informed [name] and she is going to speak to [name] and arrange f/u with Neurology. [name] made aware.

## 2016-04-20 NOTE — Progress Notes (Signed)
ANTICOAGULATION CONSULT NOTE - Follow Up Consult  Pharmacy Consult for Heparin/Coumadin Indication: Antiphospholipid Antibody Syndrome  Allergies  Allergen Reactions  . Bee Venom Anaphylaxis  . Hornet Venom Anaphylaxis    Anaphylaxis Shock SHOCK  . Reglan [Metoclopramide] Shortness Of Breath and Other (See Comments)    Tongue got numb; didn't feel good  . Vitamin K Anaphylaxis and Rash    "kills me" iv  . Benadryl [Diphenhydramine Hcl]     Rash  . Crestor [Rosuvastatin Calcium]     Muscle Aches  . Doxycycline     Tongue burning, thrush  . Levofloxacin     Rash  . Zocor [Simvastatin]     Muscle Aches  . Ciprofloxacin Rash  . Daypro [Oxaprozin] Rash    Rash  . Phytonadione Rash  . Sulfonamide Derivatives Rash    Patient Measurements: Height: 5' 4.96" (165 cm) Weight: 180 lb (81.6 kg) IBW/kg (Calculated) : 56.91 Heparin Dosing Weight: 74 kg  Vital Signs: Temp: 97.9 F (36.6 C) (09/26 0532) Temp Source: Oral (09/26 0532) BP: 107/58 (09/26 0532) Pulse Rate: 62 (09/26 0532)  Labs:  Recent Labs  04/18/16 0354 04/19/16 0724 04/20/16 0239  HGB 13.7 12.2 11.5*  HCT 41.0 37.4 35.7*  PLT 249 250 243  LABPROT 13.4 13.5 17.5*  INR 1.02 1.03 1.42  HEPARINUNFRC 0.61 0.75* 0.50    Estimated Creatinine Clearance: 45.1 mL/min (by C-G formula based on SCr of 1.26 mg/dL (H)).   Assessment:  Anticoag: Heparin/warfarin for hx antiphospholipid ab syndrome, h/o TIA- HL 0.5 in goal, INR 1.03>1.42, H/H 13.7>12.2>11.5, plts 243 stable. Having L leg pain: no DVT on dopplers. - PTA Coumadin: 5 mg daily except 2.5 mg on Thursdays (admit INR 1.6)   Goal of Therapy:  Heparin level 0.3-0.7 units/ml  INR 2-3 Monitor platelets by anticoagulation protocol: Yes   Plan:  - Continue heparin gtt at 850 units/hr - Repeat Coumadin 7.5mg  po x 1 tonight - Daily HL and CBC, INR   Hriday Stai S. Alford Highland, PharmD, BCPS Clinical Staff Pharmacist Pager [phone]  Eilene Ghazi  Stillinger 04/20/2016,8:26 AM

## 2016-04-20 NOTE — Progress Notes (Signed)
Medication Samples have been provided to the patient.  Drug: enoxaparin (Lovenox) Strength: 120 mg Qty: 6 LOT: [id number] Exp.Date: 1/19 Dosing instructions: inject 120 mg subcutaneously once daily, first dose this evening  The patient has been instructed regarding the correct time, dose, and frequency of taking this medication, including desired effects and most common side effects.   Kim,Jennifer J 4:44 PM 04/20/2016

## 2016-04-20 NOTE — Discharge Summary (Signed)
Physician Discharge Summary  [name] ZMO:[id number] DOB: 12/21/1945 DOA: 04/13/2016  PCP: [name]  Admit date: 04/13/2016 Discharge date: 04/20/2016  Admitted From: Home Disposition: Home   Recommendations for Outpatient Follow-up:  1. Follow up MRI in 3 - 4 weeks to reassess findings. 2. Follow up with neurology will be scheduled by [name]. 3. Follow up with [name] in the next 1-2 weeks. 4. Patient discharged on lovenox bridge to coumadin to return to therapeutic INR after holding for LP 9/22. She's set up at INR clinic in IM center at [facility]. 5. Please follow up on the pending results of CSF analysis. 6. Recommend recheck TSH in 6 weeks. TSH was 45, so synthroid was increased 12mg to 124m.  Home Health: None Equipment/Devices: Pt already has all recommended DME.  Discharge Condition: Stable CODE STATUS: Full Diet recommendation: Regular  Brief/Interim Summary: [name] a 89.o. female with past medical history significant for antiphospholipid antibody syndrome Dx 1998 on chronic anticoagulation with coumadin, stage I ER+ breast CA 1999 s/p lumpectomy, XRT and tamoxifen, hypothyroidism due thyroidectomy, anxiety and depression, dyslipidemia and hypertension who presented on 9/19 with left-sided numbness and blurred vision involving the right eye for about 24 hours prior to this admission. INR was subtherapeutic at 1.6. CT head showed no acute intracranial finding. 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 Coumadin was held and heparin infusion started in preparation for LP which took place 9/22. Preliminary results showed 12 WBC (98% lymphocytes), normal protein, glucose just above ULN at 74, 3 RBCs. No malignant cells seen on pathology review. Additional studies, including  lyme titers and oligoclonal bands, remain pending. Empiric burst of IV steroids was given with limited improvement in symptoms. Having completed steroids per neurology, patient is stable for discharge to home with follow up with neurology as an outpatient. Ambulatory referral was placed to [name], and pt will undergo repeat MRI in 3 - 4 weeks.   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 There are still some results that have not come back yet at the time of discharge, but this will be followed up by your outpatient providers.   The plan for you is to take lovenox injections daily and continue taking coumadin. You will follow up tomorrow in the IM clinic for INR checks and go from there. You will receive samples for this.   Schedule a follow up appointment in the next week with Dr. GrBeryle [name] follow up how you're doing and the results of these tests.   If you experience any changes in mental status (confusion), or changes in localized sensation/numbness or weakness, you must return for assessment.   Increase activity slowly    Complete by:  As directed        Medication List    TAKE these medications   ALPRAZolam 0.5 MG tablet Commonly known as:  XANAX Take 0.5 mg by mouth 2 (two) times daily as needed for sleep. Every evening and sometimes during the day if  needed   aspirin 81 MG tablet Take 81 mg by mouth daily at 6 PM.   buPROPion 200 MG 12 hr tablet Commonly known as:  WELLBUTRIN SR Take 200 mg by mouth 2 (two) times daily.   CALCIUM + D PO Take 600 mg by mouth daily.   enoxaparin 120  MG/0.8ML injection Commonly known as:  LOVENOX Inject 0.8 mLs (120 mg total) into the skin daily.   fluticasone 50 MCG/ACT nasal spray Commonly known as:  FLONASE Place 2 sprays into the nose daily as needed for allergies.   HYDROcodone-acetaminophen 5-325 MG tablet Commonly known as:  NORCO/VICODIN Take 1 tablet by mouth at bedtime.   levothyroxine 112 MCG tablet Commonly known as:  SYNTHROID, LEVOTHROID Take 112 mcg by mouth daily before breakfast.   Magnesium 400 MG Tabs Take 400 mg by mouth daily.   meclizine 25 MG tablet Commonly known as:  ANTIVERT Take 1 tablet (25 mg total) by mouth 3 (three) times daily as needed for dizziness.   metoprolol succinate 25 MG 24 hr tablet Commonly known as:  TOPROL-XL Take 12.5 mg by mouth 2 (two) times daily.   omeprazole 20 MG capsule Commonly known as:  PRILOSEC Take 20 mg by mouth daily.   pravastatin 20 MG tablet Commonly known as:  PRAVACHOL Take 20 mg by mouth daily.   rOPINIRole 1 MG tablet Commonly known as:  REQUIP Take 1 mg by mouth 2 (two) times daily.   warfarin 5 MG tablet Commonly known as:  COUMADIN Take 1 tablet (5 mg total) by mouth daily. What changed:  how much to take  when to take this  additional instructions      Follow-up Information    [name]. Schedule an appointment as soon as possible for a visit in 2 week(s).   Specialty:  Family Medicine Contact information: [address] [place] [address] [phone]        Dr Beryle [name] Follow up on 04/21/2016.   Why:  Your appointment is at 10 am.  Contact information: [place] for Coumadin check         Allergies  Allergen Reactions  . Bee Venom Anaphylaxis  . Hornet Venom Anaphylaxis    Anaphylaxis Shock SHOCK  . Reglan [Metoclopramide] Shortness Of Breath and Other (See Comments)    Tongue got numb; didn't feel good  . Vitamin K Anaphylaxis and Rash    "kills me" iv  . Benadryl  [Diphenhydramine Hcl]     Rash  . Crestor [Rosuvastatin Calcium]     Muscle Aches  . Doxycycline     Tongue burning, thrush  . Levofloxacin     Rash  . Zocor [Simvastatin]     Muscle Aches  . Ciprofloxacin Rash  . Daypro [Oxaprozin] Rash    Rash  . Phytonadione Rash  . Sulfonamide Derivatives Rash    Consultations:  [facility], Dr. Shon Hale  Informal: Pt's hematologist, [name]   Procedures/Studies: Ct Head Wo Contrast  Result Date: 04/13/2016 CLINICAL DATA:  Pt states triple vision on right side, and numbness on left side from knee to toes and elbow to finger tips x1 day. Pt also has numbness in left side of face. EXAM: CT HEAD WITHOUT CONTRAST TECHNIQUE: Contiguous axial images were obtained from the base of the skull through the vertex without intravenous contrast. COMPARISON:  Brain MRI and head CT, 02/17/2016 FINDINGS: Brain: No evidence of acute infarction, hemorrhage, hydrocephalus, extra-axial collection or mass lesion/mass effect. Vascular: No hyperdense  vessel or unexpected calcification. Skull: Normal. Negative for fracture or focal lesion. Sinuses/Orbits: Minor right maxillary sinus and left sphenoid sinus mucosal thickening. Visualized sinuses otherwise clear. Normal globes and orbits. Other: Clear mastoid air cells. IMPRESSION: 1. No intracranial abnormality. Age related volume loss stable from the recent prior studies. 2. Sinus mucosal thickening, now only minor, improved from the recent prior exams. Electronically Signed   By: Lajean Manes M.D.   On: 04/13/2016 16:38   Mr Angiogram Head Wo Contrast  Result Date: 04/13/2016 CLINICAL DATA:  LEFT-sided numbness, vision changes beginning yesterday. History of anti phospholipid antibody syndrome,subtherapeutic on warfarin. History of breast cancer. EXAM: MRI HEAD WITH AND WITHOUT CONTRAST MRA HEAD WITHOUT CONTRAST MRA NECK WITHOUT AND WITH CONTRAST TECHNIQUE: Multiplanar, multiecho pulse sequences of the  brain and surrounding structures were obtained with and without intravenous contrast. Angiographic images of the Circle of Willis were obtained using MRA technique without intravenous contrast. Angiographic images of the neck were obtained using MRA technique without and with intravenous contrast. Carotid stenosis measurements (when applicable) are obtained utilizing NASCET criteria, using the distal internal carotid diameter as the denominator. CONTRAST:  18 cc MultiHance COMPARISON:  CT HEAD April 13, 2016 at 1625 hours and MRI and MRA of the head February 17, 2016 FINDINGS: MRI HEAD FINDINGS BRAIN: No reduced diffusion to suggest acute ischemia. No susceptibility artifact to suggest hemorrhage. The ventricles and sulci are normal for patient's age. At least 10 sub cm enhancing foci in the cerebellum, brainstem. Sub cm enhancing lesions in the RIGHT greater than LEFT basal ganglia, corona radiata and centrum semiovale. Corresponding mild T2 hyperintense signal. Patchy supratentorial white matter FLAIR T2 hyperintensities exclusive of the aforementioned abnormality, relatively unchanged. No midline shift or mass effect. No abnormal extra-axial fluid collections,abnormal extra-axial enhancement or masses. VASCULAR: Normal major intracranial vascular flow voids present at skull base. SKULL AND UPPER CERVICAL SPINE: No abnormal sellar expansion. No suspicious calvarial bone marrow signal. Craniocervical junction maintained. SINUSES/ORBITS: The mastoid air-cells and included paranasal sinuses are well-aerated. The included ocular globes and orbital contents are non-suspicious. OTHER:  None. MRA HEAD FINDINGS ANTERIOR CIRCULATION: Normal flow related enhancement of the included cervical, petrous, cavernous and supraclinoid internal carotid arteries. Patent anterior communicating artery. Normal flow related enhancement of the anterior and middle cerebral arteries,including distal segments.No large vessel occlusion,  high-grade stenosis, abnormal luminal irregularity, aneurysm. POSTERIOR CIRCULATION: RIGHT vertebral artery is dominant. LEFT vertebral artery terminates in the posterior inferior cerebellar artery. Artery is patent, with normal flow related enhancement of the main branch vessels. Robust bilateral posterior communicating arteries present. Normal flow related enhancement of the posterior cerebral arteries.No large vessel occlusion, high-grade stenosis, abnormal luminal irregularity, aneurysm. MRA NECK FINDINGS Source images and MIP image were reviewed. The common carotid arteries are widely patent bilaterally. The carotid bifurcation is patent approximate 50% stenosis RIGHT internal carotid artery origin by NASCET criteria. Mild luminal irregularity of the bilateral vertebral arteries which remain patent throughout the course. IMPRESSION: MRI HEAD: Multiple subcentimeter foci of abnormal enhancement in the supra- and infratentorial brain predominately in a perivascular distribution most consistent with leptomeningitis. Though findings are often infectious or inflammatory, given given patient's history of breast cancer, neoplasm is a consideration. Recommend correlation with cerebral spinal fluid studies. MRA HEAD: Negative ; no typical findings of vasculopathy. MRA NECK: 50% stenosis RIGHT internal carotid artery origin by NASCET criteria.Mild vertebral artery regularity favoring atherosclerosis. Acute findings discussed with and reconfirmed by PA BOWIE TRAN on 04/13/2016 at 7: 40 pm.  Electronically Signed   By: Elon Alas M.D.   On: 04/13/2016 19:53   Mr Angiogram Neck W Or Wo Contrast  Result Date: 04/13/2016 CLINICAL DATA:  LEFT-sided numbness, vision changes beginning yesterday. History of anti phospholipid antibody syndrome,subtherapeutic on warfarin. History of breast cancer. EXAM: MRI HEAD WITH AND WITHOUT CONTRAST MRA HEAD WITHOUT CONTRAST MRA NECK WITHOUT AND WITH CONTRAST TECHNIQUE: Multiplanar,  multiecho pulse sequences of the brain and surrounding structures were obtained with and without intravenous contrast. Angiographic images of the Circle of Willis were obtained using MRA technique without intravenous contrast. Angiographic images of the neck were obtained using MRA technique without and with intravenous contrast. Carotid stenosis measurements (when applicable) are obtained utilizing NASCET criteria, using the distal internal carotid diameter as the denominator. CONTRAST:  18 cc MultiHance COMPARISON:  CT HEAD April 13, 2016 at 1625 hours and MRI and MRA of the head February 17, 2016 FINDINGS: MRI HEAD FINDINGS BRAIN: No reduced diffusion to suggest acute ischemia. No susceptibility artifact to suggest hemorrhage. The ventricles and sulci are normal for patient's age. At least 10 sub cm enhancing foci in the cerebellum, brainstem. Sub cm enhancing lesions in the RIGHT greater than LEFT basal ganglia, corona radiata and centrum semiovale. Corresponding mild T2 hyperintense signal. Patchy supratentorial white matter FLAIR T2 hyperintensities exclusive of the aforementioned abnormality, relatively unchanged. No midline shift or mass effect. No abnormal extra-axial fluid collections,abnormal extra-axial enhancement or masses. VASCULAR: Normal major intracranial vascular flow voids present at skull base. SKULL AND UPPER CERVICAL SPINE: No abnormal sellar expansion. No suspicious calvarial bone marrow signal. Craniocervical junction maintained. SINUSES/ORBITS: The mastoid air-cells and included paranasal sinuses are well-aerated. The included ocular globes and orbital contents are non-suspicious. OTHER:  None. MRA HEAD FINDINGS ANTERIOR CIRCULATION: Normal flow related enhancement of the included cervical, petrous, cavernous and supraclinoid internal carotid arteries. Patent anterior communicating artery. Normal flow related enhancement of the anterior and middle cerebral arteries,including distal  segments.No large vessel occlusion, high-grade stenosis, abnormal luminal irregularity, aneurysm. POSTERIOR CIRCULATION: RIGHT vertebral artery is dominant. LEFT vertebral artery terminates in the posterior inferior cerebellar artery. Artery is patent, with normal flow related enhancement of the main branch vessels. Robust bilateral posterior communicating arteries present. Normal flow related enhancement of the posterior cerebral arteries.No large vessel occlusion, high-grade stenosis, abnormal luminal irregularity, aneurysm. MRA NECK FINDINGS Source images and MIP image were reviewed. The common carotid arteries are widely patent bilaterally. The carotid bifurcation is patent approximate 50% stenosis RIGHT internal carotid artery origin by NASCET criteria. Mild luminal irregularity of the bilateral vertebral arteries which remain patent throughout the course. IMPRESSION: MRI HEAD: Multiple subcentimeter foci of abnormal enhancement in the supra- and infratentorial brain predominately in a perivascular distribution most consistent with leptomeningitis. Though findings are often infectious or inflammatory, given given patient's history of breast cancer, neoplasm is a consideration. Recommend correlation with cerebral spinal fluid studies. MRA HEAD: Negative ; no typical findings of vasculopathy. MRA NECK: 50% stenosis RIGHT internal carotid artery origin by NASCET criteria.Mild vertebral artery regularity favoring atherosclerosis. Acute findings discussed with and reconfirmed by PA BOWIE TRAN on 04/13/2016 at 7: 40 pm. Electronically Signed   By: Elon Alas M.D.   On: 04/13/2016 19:53   Mr Jeri Cos VQ Contrast  Result Date: 04/13/2016 CLINICAL DATA:  LEFT-sided numbness, vision changes beginning yesterday. History of anti phospholipid antibody syndrome, subtherapeutic on warfarin. History of breast cancer. EXAM: MRI HEAD WITH AND WITHOUT CONTRAST MRA HEAD WITHOUT CONTRAST MRA NECK WITHOUT AND  WITH CONTRAST  TECHNIQUE: Multiplanar, multiecho pulse sequences of the brain and surrounding structures were obtained with and without intravenous contrast. Angiographic images of the Circle of Willis were obtained using MRA technique without intravenous contrast. Angiographic images of the neck were obtained using MRA technique without and with intravenous contrast. Carotid stenosis measurements (when applicable) are obtained utilizing NASCET criteria, using the distal internal carotid diameter as the denominator. CONTRAST:  18 cc MultiHance COMPARISON:  CT HEAD April 13, 2016 at 1625 hours and MRI and MRA of the head February 17, 2016 FINDINGS: MRI HEAD FINDINGS BRAIN: No reduced diffusion to suggest acute ischemia. No susceptibility artifact to suggest hemorrhage. The ventricles and sulci are normal for patient's age. At least 10 sub cm enhancing foci in the cerebellum, brainstem. Sub cm enhancing lesions in the RIGHT greater than LEFT basal ganglia, corona radiata and centrum semiovale. Corresponding mild T2 hyperintense signal. Patchy supratentorial white matter FLAIR T2 hyperintensities exclusive of the aforementioned abnormality, relatively unchanged. No midline shift or mass effect. No abnormal extra-axial fluid collections, abnormal extra-axial enhancement or masses. VASCULAR: Normal major intracranial vascular flow voids present at skull base. SKULL AND UPPER CERVICAL SPINE: No abnormal sellar expansion. No suspicious calvarial bone marrow signal. Craniocervical junction maintained. SINUSES/ORBITS: The mastoid air-cells and included paranasal sinuses are well-aerated. The included ocular globes and orbital contents are non-suspicious. OTHER: None. MRA HEAD FINDINGS ANTERIOR CIRCULATION: Normal flow related enhancement of the included cervical, petrous, cavernous and supraclinoid internal carotid arteries. Patent anterior communicating artery. Normal flow related enhancement of the anterior and middle cerebral arteries,  including distal segments. No large vessel occlusion, high-grade stenosis, abnormal luminal irregularity, aneurysm. POSTERIOR CIRCULATION: RIGHT vertebral artery is dominant. LEFT vertebral artery terminates in the posterior inferior cerebellar artery. Artery is patent, with normal flow related enhancement of the main branch vessels. Robust bilateral posterior communicating arteries present. Normal flow related enhancement of the posterior cerebral arteries. No large vessel occlusion, high-grade stenosis, abnormal luminal irregularity, aneurysm. MRA NECK FINDINGS Source images and MIP image were reviewed. The common carotid arteries are widely patent bilaterally. The carotid bifurcation is patent approximate 50% stenosis RIGHT internal carotid artery origin by NASCET criteria. Mild luminal irregularity of the bilateral vertebral arteries which remain patent throughout the course. IMPRESSION: MRI HEAD: Multiple subcentimeter foci of abnormal enhancement in the supra- and infratentorial brain predominately in a perivascular distribution most consistent with leptomeningitis. Though findings are often infectious or inflammatory, given given patient's history of breast cancer, neoplasm is a consideration. Recommend correlation with cerebral spinal fluid studies. MRA HEAD: Negative ; no typical findings of vasculopathy. MRA NECK: 50% stenosis RIGHT internal carotid artery origin by NASCET criteria. Mild vertebral artery regularity favoring atherosclerosis. Acute findings discussed with and reconfirmed by PA BOWIE TRAN on 04/13/2016 at 7:40 pm. Electronically Signed   By: Elon Alas M.D.   On: 04/13/2016 19:44   Dg Fluoro Guide Lumbar Puncture  Result Date: 04/16/2016 CLINICAL DATA:  70 year old female with acute onset diplopia and abnormal brain MRI with differential considerations of CNS infection and metastatic disease. Remote history breast cancer. Initial encounter. EXAM: DIAGNOSTIC LUMBAR PUNCTURE UNDER  FLUOROSCOPIC GUIDANCE FLUOROSCOPY TIME:  Fluoroscopy Time:  0 minutes 6 seconds Radiation Exposure Index (if provided by the fluoroscopic device): Number of Acquired Spot Images: 0 PROCEDURE: Informed consent was obtained from the patient prior to the procedure, including potential complications of headache, allergy, and pain. A "time-out" was performed. With the patient prone, the lower back was prepped with Betadine. 1%  Lidocaine was used for local anesthesia. Lumbar puncture was performed at the L2-L3 level using a 3.5 inch x 20 gauge needle with return of clear, colorless CSF with an opening pressure of 23 cm water. 20 ml of CSF were obtained for laboratory studies. The needle was withdrawn and direct pressure was held until hemostasis was noted. The patient tolerated the procedure well and there were no apparent complications. Appropriate postprocedural orders were placed in the EMR. IMPRESSION: Lumbar puncture with fluoroscopic guidance at L2-L3. Opening pressure of 23 cm of water. 20 mL of CSF collected for lab analysis. Electronically Signed   By: Genevie Ann M.D.   On: 04/16/2016 12:03    CRP 1.3 ESR 11 CSF fungal cx pending CSF Lyme titers pending CSF oligoclonal bands pending CSF ACE pending CSF cytology pending  From 10/06/15: ANCA panel negative ANA 1:80 RF negative SPEP negative  From 03/05/16:  Anti-dsDNA abs negative C3 complement normal C4 complement normal ESR 7  Subjective: Pt with steady left sided numbness around ear, left leg, and left arm and diplopia worst when looking to the right.   Discharge Exam: Vitals:   04/20/16 0903 04/20/16 1324  BP: 119/70 (!) 98/54  Pulse: 65 71  Resp: 20 20  Temp: 97.4 F (36.3 C) 98.2 F (36.8 C)   Vitals:   04/20/16 0109 04/20/16 0532 04/20/16 0903 04/20/16 1324  BP: 112/64 (!) 107/58 119/70 (!) 98/54  Pulse: 66 62 65 71  Resp: '18 18 20 20  ' Temp: 97.9 F (36.6 C) 97.9 F (36.6 C) 97.4 F (36.3 C) 98.2 F (36.8 C)   TempSrc: Oral Oral Oral Oral  SpO2: 96% 96% 97% 96%  Weight:      Height:       General: Pt is alert, awake, not in acute distress Cardiovascular: RRR, S1/S2 +, no rubs, no gallops Respiratory: CTA bilaterally, no wheezing, no rhonchi Abdominal: Soft, NT, ND, bowel sounds + Extremities: no edema, no cyanosis Neuro: Limited right eye abduction, decreased sensation around left ear not involving face. Face symmetric. Dysesthesia reported with palpation of left leg and arm. Strength 5/5 throughout.  The results of significant diagnostics from this hospitalization (including imaging, microbiology, ancillary and laboratory) are listed below for reference.    Microbiology: Recent Results (from the past 240 hour(s))  CSF culture     Status: None   Collection Time: 04/16/16 11:57 AM  Result Value Ref Range Status   Specimen Description CSF  Final   Special Requests NONE  Final   Gram Stain   Final    CYTOSPIN SMEAR WBC PRESENT, PREDOMINANTLY MONONUCLEAR NO ORGANISMS SEEN    Culture NO GROWTH 3 DAYS  Final   Report Status 04/20/2016 FINAL  Final     Labs: BNP (last 3 results) No results for input(s): BNP in the last 8760 hours. Basic Metabolic Panel:  Recent Labs Lab 04/15/16 0620  NA 136  K 4.3  CL 101  CO2 24  GLUCOSE 116*  BUN 16  CREATININE 1.26*  CALCIUM 9.0  MG 2.2   Liver Function Tests: No results for input(s): AST, ALT, ALKPHOS, BILITOT, PROT, ALBUMIN in the last 168 hours. No results for input(s): LIPASE, AMYLASE in the last 168 hours. No results for input(s): AMMONIA in the last 168 hours. CBC:  Recent Labs Lab 04/16/16 0330 04/17/16 0250 04/18/16 0354 04/19/16 0724 04/20/16 0239  WBC 9.8 10.3 13.3* 25.1* 18.8*  HGB 13.5 13.4 13.7 12.2 11.5*  HCT 41.8 42.4 41.0 37.4  35.7*  MCV 97.2 97.9 95.3 95.9 96.5  PLT 251 277 249 250 243   Cardiac Enzymes: No results for input(s): CKTOTAL, CKMB, CKMBINDEX, TROPONINI in the last 168 hours. BNP: Invalid  input(s): POCBNP CBG: No results for input(s): GLUCAP in the last 168 hours. D-Dimer No results for input(s): DDIMER in the last 72 hours. Hgb A1c No results for input(s): HGBA1C in the last 72 hours. Lipid Profile No results for input(s): CHOL, HDL, LDLCALC, TRIG, CHOLHDL, LDLDIRECT in the last 72 hours. Thyroid function studies No results for input(s): TSH, T4TOTAL, T3FREE, THYROIDAB in the last 72 hours.  Invalid input(s): FREET3 Anemia work up No results for input(s): VITAMINB12, FOLATE, FERRITIN, TIBC, IRON, RETICCTPCT in the last 72 hours. Urinalysis    Component Value Date/Time   COLORURINE YELLOW 11/24/2008 0359   APPEARANCEUR CLEAR 11/24/2008 0359   LABSPEC 1.007 11/24/2008 0359   PHURINE 6.0 11/24/2008 0359   GLUCOSEU NEGATIVE 11/24/2008 0359   HGBUR MODERATE (A) 11/24/2008 0359   BILIRUBINUR NEGATIVE 11/24/2008 0359   KETONESUR NEGATIVE 11/24/2008 0359   PROTEINUR NEGATIVE 11/24/2008 0359   UROBILINOGEN 0.2 11/24/2008 0359   NITRITE NEGATIVE 11/24/2008 0359   LEUKOCYTESUR NEGATIVE 11/24/2008 0359   Sepsis Labs Invalid input(s): PROCALCITONIN,  WBC,  LACTICIDVEN Microbiology Recent Results (from the past 240 hour(s))  CSF culture     Status: None   Collection Time: 04/16/16 11:57 AM  Result Value Ref Range Status   Specimen Description CSF  Final   Special Requests NONE  Final   Gram Stain   Final    CYTOSPIN SMEAR WBC PRESENT, PREDOMINANTLY MONONUCLEAR NO ORGANISMS SEEN    Culture NO GROWTH 3 DAYS  Final   Report Status 04/20/2016 FINAL  Final    Time coordinating discharge: Over 84 minutes  Vance Gather, MD  [facility] 04/20/2016, 4:28 PM Pager [phone]  If 7PM-7AM, please contact night-coverage [email or web address] Password [email or web address]

## 2016-04-20 NOTE — Progress Notes (Signed)
Physical Therapy Treatment Patient Details Name: [name] MRN: [id number] DOB: 06-Sep-1945 Today's Date: 04/20/2016    History of Present Illness 70 y.o. female with medical history significant of antiphospholipid AB syndrome, chronic anticoagulation with coumadin, TIA, HLD, HTN.  Patient presents to the ED with c/o left sided numbness and blurred vision involving R eye. CT and MRI on 9/19 negative for acute infarct.    PT Comments    Current plan remains appropriate.   Follow Up Recommendations  Outpatient PT;Supervision/Assistance - 24 hour     Equipment Recommendations  None recommended by PT    Recommendations for Other Services       Precautions / Restrictions Precautions Precautions: Fall Precaution Comments: vision changes, decreased safety during mobility Restrictions Weight Bearing Restrictions: No    Mobility  Bed Mobility Overal bed mobility: Independent             General bed mobility comments: pt sitting EOB upon arrival  Transfers Overall transfer level: Needs assistance Equipment used: Rolling walker (2 wheeled) Transfers: Sit to/from Stand Sit to Stand: Supervision         General transfer comment: for safety; cues for safety and hand placement  Ambulation/Gait Ambulation/Gait assistance: Min guard Ambulation Distance (Feet): 200 Feet Assistive device: Rolling walker (2 wheeled);4-wheeled walker Gait Pattern/deviations: Step-through pattern;Decreased stride length;Drifts right/left     General Gait Details: unsteady with head turns but no LOB; worked on visual stabilization and encouraged to wear eye patch   Stairs            Wheelchair Mobility    Modified Rankin (Stroke Patients Only)       Balance     Sitting balance-Leahy Scale: Good       Standing balance-Leahy Scale: Fair                      Cognition Arousal/Alertness: Awake/alert Behavior During Therapy: WFL for tasks  assessed/performed Overall Cognitive Status: Within Functional Limits for tasks assessed                      Exercises Other Exercises Other Exercises: pt able to identify 3/5 items in R visual field    General Comments General comments (skin integrity, edema, etc.): reviewed visual stabilization, using eye patch, activity progression, and using walker; family present       Pertinent Vitals/Pain Pain Assessment: No/denies pain    Home Living                      Prior Function            PT Goals (current goals can now be found in the care plan section) Acute Rehab PT Goals Patient Stated Goal: return to caring for mother Progress towards PT goals: Progressing toward goals    Frequency    Min 4X/week      PT Plan Current plan remains appropriate    Co-evaluation             End of Session Equipment Utilized During Treatment: Gait belt Activity Tolerance: Patient tolerated treatment well Patient left: in bed;with call bell/phone within reach;with family/visitor present;with bed alarm set     Time: [id number] PT Time Calculation (min) (ACUTE ONLY): 24 min  Charges:  $Gait Training: 8-22 mins $Therapeutic Activity: 8-22 mins                    G Codes:  Salina April, PTA Pager: [phone]   04/20/2016, 4:48 PM

## 2016-04-20 NOTE — Progress Notes (Signed)
Pt d/c to home by car with family. Assessment stable. All questions answered. 

## 2016-04-20 NOTE — Progress Notes (Signed)
Occupational Therapy Treatment Patient Details Name: [name] MRN: [id number] DOB: 01/25/1946 Today's Date: 04/20/2016    History of present illness 70 y.o. female with medical history significant of antiphospholipid AB syndrome, chronic anticoagulation with coumadin, TIA, HLD, HTN.  Patient presents to the ED with c/o left sided numbness and blurred vision involving R eye. CT and MRI on 9/19 negative for acute infarct.   OT comments  Pt making progress with functional goals. Pt continues to have visual impairments and numbness in L LE which impacts her safety during ADLs and ADL mobility. Pt not wearing eye patch u[on entering room. OT will continue to follow acutely  Follow Up Recommendations  Outpatient OT;Supervision/Assistance - 24 hour    Equipment Recommendations  Tub/shower seat    Recommendations for Other Services      Precautions / Restrictions Precautions Precautions: Fall Precaution Comments: vision changes, decreased safety during mobility Restrictions Weight Bearing Restrictions: No       Mobility Bed Mobility               General bed mobility comments: pt sitting EOB upon arrival  Transfers Overall transfer level: Needs assistance Equipment used: Rolling walker (2 wheeled);None Transfers: Sit to/from Stand Sit to Stand: Min guard         General transfer comment: for safety due to unsteadiness and visual impairments    Balance     Sitting balance-Leahy Scale: Good       Standing balance-Leahy Scale: Fair                     ADL Overall ADL's : Needs assistance/impaired     Grooming: Standing;Wash/dry hands;Set up;Supervision/safety;Min guard;Wash/dry face;Brushing hair Grooming Details (indicate cue type and reason): min guard A for safety Upper Body Bathing: Set up;Sitting   Lower Body Bathing: Min guard;Sit to/from stand   Upper Body Dressing : Set up;Sitting           Toileting- Clothing Manipulation and  Hygiene: Min guard;Sit to/from stand;Supervision/safety   Tub/ Shower Transfer: Min guard;Grab bars;Ambulation;3 in 1   Functional mobility during ADLs: Min guard General ADL Comments: pt continues to have diplopia/visual impaitmnts that impact her safety. During ADLs, toiletry and clothing items placed to R side of pt      Vision  diplopia, R visual filed impaired. 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                                        Prior Functioning/Environment  independent, caregiver  for other with dementia           Frequency  Min 3X/week        Progress Toward Goals  OT Goals(current goals can now be found in the care plan section)  Progress towards OT goals: Progressing toward goals     Plan Discharge plan remains appropriate                     End of Session Equipment Utilized During Treatment: Gait belt;Rolling walker;Other (comment) (3 in 1)   Activity Tolerance Patient tolerated treatment well   Patient Left with call bell/phone within reach;in bed;with bed alarm set (sitting EOB)   Nurse Communication Mobility status;Other (comment) (ADLs)        Time: [id number] OT Time Calculation (min): 24 min  Charges: OT General Charges $OT Visit: 1 Procedure OT Treatments $Self Care/Home Management : 8-22 mins $Therapeutic Activity: 8-22 mins  Britt Bottom 04/20/2016, 1:14 PM

## 2016-04-21 ENCOUNTER — Ambulatory Visit: Payer: Medicare HMO | Admitting: Pharmacist

## 2016-04-21 ENCOUNTER — Ambulatory Visit (INDEPENDENT_AMBULATORY_CARE_PROVIDER_SITE_OTHER): Payer: Medicare HMO | Admitting: Pharmacist

## 2016-04-21 DIAGNOSIS — R760 Raised antibody titer: Secondary | ICD-10-CM

## 2016-04-21 DIAGNOSIS — Z7901 Long term (current) use of anticoagulants: Secondary | ICD-10-CM | POA: Diagnosis not present

## 2016-04-21 DIAGNOSIS — R790 Abnormal level of blood mineral: Secondary | ICD-10-CM | POA: Diagnosis not present

## 2016-04-21 DIAGNOSIS — D6861 Antiphospholipid syndrome: Secondary | ICD-10-CM | POA: Diagnosis not present

## 2016-04-21 LAB — POCT INR: INR: 2.3

## 2016-04-21 LAB — OLIGOCLONAL BANDS, CSF + SERM

## 2016-04-21 LAB — ANGIOTENSIN CONVERTING ENZYME, CSF: Angio Convert Enzyme: 1.4 U/L (ref 0.0–2.5)

## 2016-04-21 MED ORDER — WARFARIN SODIUM 5 MG PO TABS: 5.0000 mg | ORAL_TABLET | ORAL | 3 refills | Status: DC

## 2016-04-21 NOTE — Progress Notes (Signed)
Anticoagulation Management [name] is a 70 y.o. female who reports to the clinic for monitoring of warfarin treatment.    Indication: thrombophilia, DVT history Duration: indefinite  Anticoagulation Clinic Visit History: Patient does not report signs/symptoms of bleeding or thromboembolism, was discharged from hospital 04/19/16 (viral meningitis)  Anticoagulation Episode Summary    Current INR goal:   2.0-3.0  TTR:   68.7 % (4.8 y)  Next INR check:   04/23/2016  INR from last check:   2.3 (04/21/2016)  Weekly max dose:     Target end date:     INR check location:     Preferred lab:     Send INR reminders to:   [facility]   Indications   Antiphospholipid antibody syndrome ([place]) [D68.61] Lupus anticoagulant positive [R79.0]       Comments:         Anticoagulation Care Providers    Provider Role Specialty Phone number   Annia Belt, MD Referring Oncology [phone]     ASSESSMENT Recent Results: The most recent result is correlated with 32.5 mg per week: Lab Results  Component Value Date   INR 2.3 04/21/2016   INR 1.42 04/20/2016   INR 1.03 04/19/2016   PROTIME 30.0 (H) 03/13/2015   Anticoagulation Dosing:    Total Sun Mon Tue Wed Thu Fri Sat   New Dose 32.5 mg 5 mg 5 mg 5 mg 2.5 mg 5 mg 5 mg 5 mg     (5 mg x 1)  (5 mg x 1)  (5 mg x 1)  (5 mg x 0.5)  (5 mg x 1)  (5 mg x 1)  (5 mg x 1)                           INR today: Therapeutic  PLAN Weekly dose was re-initiated  Patient Instructions  Patient educated about medication as defined in this encounter and verbalized understanding by repeating back instructions provided.   Patient advised to contact clinic or seek medical attention if signs/symptoms of bleeding or thromboembolism occur.  Patient verbalized understanding by repeating back information and was advised to contact me if further medication-related questions arise. Patient was also provided an information  handout.  Follow-up 2 days.  [name] J  30 minutes spent face-to-face with the patient during the encounter. 50% of time spent on education. 50% of time was spent on assessment and plan.

## 2016-04-21 NOTE — Progress Notes (Signed)
Reviewed Thanks. I saw her in the hospital this week. DrG

## 2016-04-21 NOTE — Patient Instructions (Addendum)
Patient educated about medication as defined in this encounter and verbalized understanding by repeating back instructions provided.   

## 2016-04-23 ENCOUNTER — Ambulatory Visit (INDEPENDENT_AMBULATORY_CARE_PROVIDER_SITE_OTHER): Payer: Medicare HMO | Admitting: Pharmacist

## 2016-04-23 DIAGNOSIS — Z7901 Long term (current) use of anticoagulants: Secondary | ICD-10-CM | POA: Diagnosis not present

## 2016-04-23 DIAGNOSIS — D6861 Antiphospholipid syndrome: Secondary | ICD-10-CM | POA: Diagnosis not present

## 2016-04-23 DIAGNOSIS — R760 Raised antibody titer: Secondary | ICD-10-CM

## 2016-04-23 LAB — B. BURGDORFI ANTIBODIES, CSF

## 2016-04-23 LAB — POCT INR: INR: 2.2

## 2016-04-23 NOTE — Progress Notes (Signed)
Anticoagulation Management [name] is a 70 y.o. female who reports to the clinic for monitoring of warfarin treatment.    Indication: antiphospholipid antibody, lupus anticoagulantDVT history Duration: indefinite  Anticoagulation Clinic Visit History: Patient does not report signs/symptoms of bleeding or thromboembolism  Anticoagulation Episode Summary    Current INR goal:   2.0-3.0  TTR:   68.7 % (4.8 y)  Next INR check:   04/28/2016  INR from last check:   2.2 (04/23/2016)  Weekly max dose:     Target end date:     INR check location:     Preferred lab:     Send INR reminders to:   [facility]   Indications   Antiphospholipid antibody syndrome ([place]) [D68.61] Lupus anticoagulant positive [R79.0]       Comments:         Anticoagulation Care Providers    Provider Role Specialty Phone number   Annia Belt, MD Referring Oncology [phone]     ASSESSMENT Recent Results: The most recent result is correlated with 32.5 mg per week: Lab Results  Component Value Date   INR 2.2 04/23/2016   INR 2.3 04/21/2016   INR 1.42 04/20/2016   PROTIME 30.0 (H) 03/13/2015    Anticoagulation Dosing: INR as of 04/23/2016 and Previous Dosing Information    INR Dt INR Goal [employer] Sun Mon Tue Wed Thu Fri Sat   04/23/2016 2.2 2.0-3.0 32.5 mg 5 mg 5 mg 5 mg 2.5 mg 5 mg 5 mg 5 mg    Anticoagulation Dose Instructions as of 04/23/2016      Total Sun Mon Tue Wed Thu Fri Sat   New Dose 32.5 mg 5 mg 5 mg 5 mg 2.5 mg 5 mg 5 mg 5 mg     (5 mg x 1)  (5 mg x 1)  (5 mg x 1)  (5 mg x 0.5)  (5 mg x 1)  (5 mg x 1)  (5 mg x 1)                           INR today: Therapeutic  PLAN Weekly dose was unchanged  Patient Instructions  Patient educated about medication as defined in this encounter and verbalized understanding by repeating back instructions provided.   Patient advised to contact clinic or seek medical attention if signs/symptoms of bleeding or  thromboembolism occur.  Patient verbalized understanding by repeating back information and was advised to contact me if further medication-related questions arise. Patient was also provided an information handout.  Follow-up 1 week  Kim,Jennifer J  15 minutes spent face-to-face with the patient during the encounter. 50% of time spent on education. 50% of time was spent on assessment, plan, and coordination of care.

## 2016-04-23 NOTE — Patient Instructions (Signed)
Patient educated about medication as defined in this encounter and verbalized understanding by repeating back instructions provided.   

## 2016-04-24 NOTE — Progress Notes (Signed)
Reviewed Thanks DrG 

## 2016-04-26 ENCOUNTER — Telehealth: Payer: Self-pay | Admitting: *Deleted

## 2016-04-26 NOTE — Telephone Encounter (Signed)
Pt calls and states she had a "spinal tap" when she was inpt mid September and she has not heard from anyone about the results, she would like for dr Beryle Beams to address this, please call her at her listed ph#'s

## 2016-04-27 ENCOUNTER — Ambulatory Visit: Payer: Medicare HMO | Admitting: Pharmacist

## 2016-04-28 ENCOUNTER — Ambulatory Visit (INDEPENDENT_AMBULATORY_CARE_PROVIDER_SITE_OTHER): Payer: Medicare HMO | Admitting: Pharmacist

## 2016-04-28 DIAGNOSIS — Z86718 Personal history of other venous thrombosis and embolism: Secondary | ICD-10-CM | POA: Diagnosis not present

## 2016-04-28 DIAGNOSIS — Z8673 Personal history of transient ischemic attack (TIA), and cerebral infarction without residual deficits: Secondary | ICD-10-CM | POA: Diagnosis not present

## 2016-04-28 DIAGNOSIS — D6861 Antiphospholipid syndrome: Secondary | ICD-10-CM

## 2016-04-28 DIAGNOSIS — Z7901 Long term (current) use of anticoagulants: Secondary | ICD-10-CM

## 2016-04-28 DIAGNOSIS — R760 Raised antibody titer: Secondary | ICD-10-CM

## 2016-04-29 ENCOUNTER — Ambulatory Visit (INDEPENDENT_AMBULATORY_CARE_PROVIDER_SITE_OTHER): Payer: Medicare HMO | Admitting: Neurology

## 2016-04-29 ENCOUNTER — Encounter: Payer: Self-pay | Admitting: Neurology

## 2016-04-29 VITALS — BP 127/80 | HR 73 | Ht 64.5 in | Wt 185.5 lb

## 2016-04-29 DIAGNOSIS — Z862 Personal history of diseases of the blood and blood-forming organs and certain disorders involving the immune mechanism: Secondary | ICD-10-CM

## 2016-04-29 DIAGNOSIS — R760 Raised antibody titer: Secondary | ICD-10-CM

## 2016-04-29 DIAGNOSIS — R938 Abnormal findings on diagnostic imaging of other specified body structures: Secondary | ICD-10-CM | POA: Diagnosis not present

## 2016-04-29 DIAGNOSIS — M79605 Pain in left leg: Secondary | ICD-10-CM | POA: Diagnosis not present

## 2016-04-29 DIAGNOSIS — E039 Hypothyroidism, unspecified: Secondary | ICD-10-CM

## 2016-04-29 DIAGNOSIS — R9389 Abnormal findings on diagnostic imaging of other specified body structures: Secondary | ICD-10-CM

## 2016-04-29 LAB — POCT INR: INR: 2.4

## 2016-04-29 NOTE — Progress Notes (Signed)
PATIENT: SECRET KRISTENSEN DOB: August 01, 1945  Chief Complaint  Patient presents with  . Aseptic Meningitis    On 04/11/16, she started experiencing nausea, vomiting, headache, left leg numbness, left arm numbness and visual disturbances.  She went to the ED on 04/13/16, states she was diagnosed with aseptic meningitis and would like to review her labs and scans.  She was started on gabapentin 373m, BID.  States initial symptoms have now resolved.  She does report acute pain in her tongue.     HISTORICAL  EEMALINA DUBREUILis a 70year old right-handed female, seen in refer by her primary care from [facility] KAletha Halimfor evaluation of one episode of severe headache nausea or vomiting, initial evaluation was April 29 2016.  She had antiphospholipid syndrome, had history of digital blood clot, has been on chronic Coumadin treatment for many years, anxiety, hypothyroidism, on supplement, she has been on Requip 1 mg twice a day since 2014 for restless leg syndrome, with sub optimal control of her symptoms, she was recently tried on gabapentin, was not sure about the benefit, previously was treated with gabapentin up to 1800 mg a day without significant improvement, right breast cancer, s/p lobectomy, followed by chemo and radiation therapy,  She lives with her mother, who suffered severe dementia, she is the main caregiver  I reviewed and summarized hospital record, she was admitted to the hospital on April 13 2016, prior to admission, she complained few days history of headaches, had right parietal region, nausea or vomiting, also noticed double vision when looking to the right side, upon presentation, she also complains of left-sided numbness, INR upon presentation was 1.6, I personally reviewed  CT scan of her head showed no acute intracranial abnormality. MRI of her brain showed no signs of an acute stroke. 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  She has been afebrile, she denies stiff neck   Laboratory showed LDL 118, normal CBC, hemoglobin of 14 point 1, normal CMP, with creatinine of 1.42, A1c 6.0, most recent INR on September 29th 7017 was 2.2, heparin unfractionated 0.5, CBC showed hemoglobin 11 point 5, elevated WBC 18 point 8, C-reactive protein 1.3, ESR 11, negative RPR,  Spinal fluid testing RBC 3, WBC 12, lymphocyte 98%, monocytes 2% 0 oligoclonal banding,, total protein is 40, glucose was mildly elevated 74, angiotension converting enzyme 1.4, TSH was elevated 45, and LDL was 118, triglycerides 231, A1c 6.0,  She was evaluated by neuro hospitalists, was given Solu-Medrol 500 mg for 3 consecutive days, she did report significant improvement, she can move her eyes better, no longer has significant double vision. She no longer has significant left sided numbness, but complains of fatigue,   REVIEW OF SYSTEMS: Full 14 system review of systems performed and notable only for headaches, weakness, restless leg, depression, decreased energy, feeling hot, birthmarks  ALLERGIES: Allergies  Allergen Reactions  . Bee Venom Anaphylaxis  . Hornet Venom Anaphylaxis    Anaphylaxis Shock SHOCK  . Reglan [Metoclopramide] Shortness Of Breath and Other (See Comments)    Tongue got numb; didn't feel good  . Vitamin K Anaphylaxis and Rash    "kills me" iv  . Benadryl [Diphenhydramine Hcl]     Rash  . Crestor [Rosuvastatin Calcium]     Muscle Aches  . Doxycycline     Tongue burning, thrush  . Levofloxacin  Rash  . Zocor [Simvastatin]     Muscle Aches  . Ciprofloxacin Rash  . Daypro [Oxaprozin] Rash    Rash  . Phytonadione Rash  . Sulfonamide Derivatives Rash    HOME MEDICATIONS: Current Outpatient Prescriptions  Medication Sig Dispense Refill  . ALPRAZolam (XANAX) 0.5 MG tablet Take 0.5 mg by mouth 2  (two) times daily as needed for sleep. Every evening and sometimes during the day if needed    . aspirin 81 MG tablet Take 81 mg by mouth daily at 6 PM.     . buPROPion (WELLBUTRIN SR) 200 MG 12 hr tablet Take 200 mg by mouth 2 (two) times daily.     . Calcium Carbonate-Vitamin D (CALCIUM + D PO) Take 600 mg by mouth daily.     . fluticasone (FLONASE) 50 MCG/ACT nasal spray Place 2 sprays into the nose daily as needed for allergies.     [name] gabapentin (NEURONTIN) 300 MG capsule Take 300 mg by mouth 2 (two) times daily.    [name] HYDROcodone-acetaminophen (NORCO/VICODIN) 5-325 MG per tablet Take 1 tablet by mouth at bedtime.     [name] levothyroxine (SYNTHROID, LEVOTHROID) 112 MCG tablet Take 112 mcg by mouth daily before breakfast.    . Magnesium 400 MG TABS Take 400 mg by mouth daily.    . metoprolol succinate (TOPROL-XL) 25 MG 24 hr tablet Take 12.5 mg by mouth 2 (two) times daily.     [name] omeprazole (PRILOSEC) 20 MG capsule Take 20 mg by mouth daily.     . pravastatin (PRAVACHOL) 20 MG tablet Take 20 mg by mouth daily.    [name] rOPINIRole (REQUIP) 1 MG tablet Take 1 mg by mouth 2 (two) times daily.     [name] warfarin (COUMADIN) 5 MG tablet Take 1 tablet (5 mg total) by mouth See admin instructions. 2.5 mg every Wednesdays, 5 mg daily on other days 25 tablet 3   No current facility-administered medications for this visit.     PAST MEDICAL HISTORY: Past Medical History:  Diagnosis Date  . Anxiety    severe panic attacks  . Arthritis   . Aseptic meningitis   . Asthma    as child  . Breast cancer ([place])    right  . Chest pain   . Chronic anticoagulation 08/04/2015  . Collapsed lung    hx of, left  . Complication of anesthesia    "hard to put asleep"  . Depression   . Fibromyalgia   . GERD (gastroesophageal reflux disease)   . H/O hiatal hernia   . Headache(784.0)   . Hepatitis 1990   "from eating at restaurant"  . History of antiphospholipid antibody syndrome   . History of DVT (deep vein  thrombosis)    in all fingers  . Hx-TIA (transient ischemic attack)   . Hyperlipidemia   . Hypertension   . Hypothyroidism   . Neuromuscular disorder ([place])   . Pneumonia    hx of  . Stroke ([place])   . 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 ABDOMINAL HYSTERECTOMY    . BREAST SURGERY Right    x3  . BUNIONECTOMY Bilateral 30 years ago  . CARDIAC CATHETERIZATION  07/12/1997   NORMAL LEFT VENTRICULAR FUNCTION WITH EF AT LEAST 70-75%  . ESOPHAGEAL MANOMETRY N/A 11/06/2012   Procedure: ESOPHAGEAL MANOMETRY (EM);  Surgeon: Pedro Earls, MD;  Location: [facility];  Service: General;  Laterality: N/A;  . ESOPHAGOGASTRODUODENOSCOPY ENDOSCOPY    . KNEE ARTHROSCOPY Right   . LAPAROSCOPIC NISSEN FUNDOPLICATION N/A 9/37/9024   Procedure: LAPAROSCOPIC TAKEDOWN  PERI-HIATAL HERNIA    REPAIR;  Surgeon: Pedro Earls, MD;  Location: [facility];  Service: General;  Laterality: N/A;  . [place]  . RIGHT OOPHORECTOMY  1980  . THYROIDECTOMY  in 20's  . TONSILLECTOMY  70 years old  . UPPER GI ENDOSCOPY N/A 12/12/2012   Procedure: UPPER GI ENDOSCOPY;  Surgeon: Pedro Earls, MD;  Location: [facility];  Service: General;  Laterality: N/A;    FAMILY HISTORY: Family History  Problem Relation Age of Onset  . Alzheimer's disease Mother   . Heart disease Mother   . Hyperlipidemia Mother   . Hypertension Mother   . Cancer Maternal Grandmother     colon  . Heart disease Sister   . Hypertension Sister   . Heart attack Sister   . Heart disease Brother   . Heart attack Brother   . Cancer Daughter   . Hypertension Daughter   . Hypertension Son     SOCIAL HISTORY:  Social History   Social History  . Marital status: Single    Spouse name: N/A  . Number of children: 2  . Years of education: 11   Occupational  History  . Not on file.   Social History Main Topics  . Smoking status: Never Smoker  . Smokeless tobacco: Never Used  . Alcohol use No  . Drug use: No  . Sexual activity: Not on file   Other Topics Concern  . Not on file   Social History Narrative   Lives at home with her mother.  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   Right-handed.   2-4 cups caffeine daily.        PHYSICAL EXAM   Vitals:   04/29/16 1407  BP: 127/80  Pulse: 73  Weight: 185 lb 8 oz (84.1 kg)  Height: 5' 4.5" (1.638 m)    Not recorded      Body mass index is 31.35 kg/m.  PHYSICAL EXAMNIATION:  Gen: NAD, conversant, well nourised, obese, well groomed                     Cardiovascular: Regular rate rhythm, no peripheral edema, warm, nontender. Eyes: Conjunctivae clear without exudates or hemorrhage Neck: Supple, no carotid bruise. Pulmonary: Clear to auscultation bilaterally   NEUROLOGICAL EXAM:  MENTAL STATUS: Speech:    Speech is normal; fluent and spontaneous with normal comprehension.  Cognition:     Orientation to time, place and person     Normal recent and remote memory     Normal Attention span and concentration     Normal Language, naming, repeating,spontaneous speech     Fund of knowledge   CRANIAL NERVES: CN II: Visual fields are full to confrontation. Fundoscopic exam is normal with sharp discs and no vascular changes. Pupils are round equal and briskly reactive to light. CN III, IV, VI: extraocular movement are normal. No ptosis. CN V: Facial sensation is intact to pinprick in all 3 divisions bilaterally. Corneal responses are intact.  CN VII: Face is symmetric with normal eye closure and smile. CN VIII: Hearing is normal to rubbing fingers CN IX, X: Palate elevates symmetrically. Phonation is normal. CN XI: Head turning and shoulder shrug are intact  CN XII: Tongue is midline with normal movements and no atrophy.  MOTOR: There is no pronator drift of  out-stretched arms. Muscle bulk and tone are normal. Muscle strength is normal.  REFLEXES: Reflexes are 2+ and symmetric at the biceps, triceps, knees, and ankles. Plantar responses are flexor.  SENSORY: Intact to light touch, pinprick, positional sensation and vibratory sensation are intact in fingers and toes.  COORDINATION: Rapid alternating movements and fine finger movements are intact. There is no dysmetria on finger-to-nose and heel-knee-shin.    GAIT/STANCE: Posture is normal. Gait is steady with normal steps, base, arm swing, and turning. Heel and toe walking are normal. Tandem gait is normal.  Romberg is absent.   DIAGNOSTIC DATA (LABS, IMAGING, TESTING) - I reviewed patient records, labs, notes, testing and imaging myself where available.   ASSESSMENT AND PLAN  SAFA DERNER is a 70 y.o. 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  She did response to IV Solu-Medrol 500 mg for 3 consecutive days, We will repeat MRI of the brain with without contrast, Continue current medications, Turning to clinic in 2 months  Marcial Pacas, M.D. Ph.D.  [facility] [address] [place], [place] [address] Ph: [phone] Fax: [phone]  [id number] Joni Reining, PA-C

## 2016-04-30 NOTE — Progress Notes (Signed)
Anticoagulation Management [name] is a 70 y.o. female who reports to the clinic for monitoring of warfarin treatment.    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] Lupus anticoagulant positive [R76.0]       Comments:         Anticoagulation Care Providers    Provider Role Specialty Phone number   Annia Belt, MD Referring Oncology [phone]     ASSESSMENT Recent Results: The most recent result is correlated with 32.5 mg per week: Lab Results  Component Value Date   INR 2.4 04/29/2016   INR 2.2 04/23/2016   INR 2.3 04/21/2016   PROTIME 30.0 (H) 03/13/2015   Anticoagulation Dosing: INR as of 04/28/2016 and Previous Dosing Information    INR Dt INR Goal Wkly Tot Sun Mon Tue Wed Thu Fri Sat     2.0-3.0 32.5 mg 5 mg 5 mg 5 mg 2.5 mg 5 mg 5 mg 5 mg    Anticoagulation Dose Instructions as of 04/28/2016      Total Sun Mon Tue Wed Thu Fri Sat   New Dose 32.5 mg 5 mg 5 mg 5 mg 2.5 mg 5 mg 5 mg 5 mg     (5 mg x 1)  (5 mg x 1)  (5 mg x 1)  (5 mg x 0.5)  (5 mg x 1)  (5 mg x 1)  (5 mg x 1)                           INR today: Therapeutic  PLAN Weekly dose was unchanged  Patient advised to contact clinic or seek medical attention if signs/symptoms of bleeding or thromboembolism occur.  Patient verbalized understanding by repeating back information and was advised to contact me if further medication-related questions arise. Patient  was also provided an information handout.  Follow-up Return in about 4 weeks (around 05/24/2016) for Follow up INR 05/24/2016 at 4pm.  Zander Ingham J  15 minutes spent face-to-face with the patient during the encounter. 50% of time spent on education. 50% of time was spent on assessment and plan.

## 2016-04-30 NOTE — Progress Notes (Signed)
Reviewed Thanks DrG 

## 2016-04-30 NOTE — Patient Instructions (Signed)
Patient educated about medication as defined in this encounter and verbalized understanding by repeating back instructions provided.   

## 2016-05-05 NOTE — Progress Notes (Signed)
Reviewed day lab done Thanks DrG

## 2016-05-17 ENCOUNTER — Telehealth: Payer: Self-pay | Admitting: *Deleted

## 2016-05-17 NOTE — Telephone Encounter (Signed)
Called pt -informed ok to take flu vaccine.  Stated kidney doctor told her not to take it now. Stated still waiting on test result ordered when in the hospital(Dr Gunz).

## 2016-05-17 NOTE — Telephone Encounter (Signed)
Call from pt - wants to know if she can get a flu vaccine now or does she needs to wait, after being in the hospital for meningitis? Thanks

## 2016-05-17 NOTE — Telephone Encounter (Signed)
Whatever! So why did she bother asking me?

## 2016-05-17 NOTE — Telephone Encounter (Signed)
Should be OK for the vaccine

## 2016-05-18 ENCOUNTER — Telehealth: Payer: Self-pay | Admitting: Neurology

## 2016-05-18 LAB — FUNGUS CULTURE WITH STAIN

## 2016-05-18 LAB — FUNGAL ORGANISM REFLEX

## 2016-05-18 LAB — FUNGUS CULTURE RESULT

## 2016-05-18 NOTE — Telephone Encounter (Signed)
Spoke with [facility] who request I send clinicals over for MRI approval. Will fax over now.

## 2016-05-19 ENCOUNTER — Telehealth: Payer: Self-pay | Admitting: *Deleted

## 2016-05-19 NOTE — Telephone Encounter (Signed)
Labs received from [place] (collected on 05/17/16):  CMP: Glucose: 104 BUN: 19 Creatinine: 1.45 eGFR, non Aficn Am: 37 EGFR, Aficn Am: 43 Remaining results: wnl  Phosphorus: 4.1 wnl  CBC: wnl  SED RATE:  Sedimentation Rate - Westergren 5 (mm/hr)  INR: 1.6 PT: 17.1

## 2016-05-25 LAB — PROTIME-INR

## 2016-06-01 ENCOUNTER — Ambulatory Visit: Payer: Medicare HMO | Admitting: Pharmacist

## 2016-06-01 DIAGNOSIS — R760 Raised antibody titer: Secondary | ICD-10-CM

## 2016-06-01 DIAGNOSIS — D6861 Antiphospholipid syndrome: Secondary | ICD-10-CM

## 2016-06-01 LAB — POCT INR: INR: 1.6

## 2016-06-01 NOTE — Progress Notes (Unsigned)
Anticoagulation Management [name] is a 70 y.o. female who reports to the clinic for monitoring of warfarin treatment.    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] Lupus anticoagulant positive [R76.0]       Comments:         Anticoagulation Care Providers    Provider Role Specialty Phone number   Annia Belt, MD Referring Oncology [phone]     ASSESSMENT Recent Results: The most recent result is correlated with 32.5 mg per week: Lab Results  Component Value Date   INR 2.4 04/29/2016   INR 2.2 04/23/2016   INR 2.3 04/21/2016   PROTIME 30.0 (H) 03/13/2015    Anticoagulation Dosing: INR as of 04/28/2016 and Previous Dosing Information    INR Dt INR Goal Wkly Tot Sun Mon Tue Wed Thu Fri Sat     2.0-3.0 32.5 mg 5 mg 5 mg 5 mg 2.5 mg 5 mg 5 mg 5 mg    Anticoagulation Dose Instructions as of 04/28/2016      Total Sun Mon Tue Wed Thu Fri Sat   New Dose 32.5 mg 5 mg 5 mg 5 mg 2.5 mg 5 mg 5 mg 5 mg     (5 mg x 1)  (5 mg x 1)  (5 mg x 1)  (5 mg x 0.5)  (5 mg x 1)  (5 mg x 1)  (5 mg x 1)                           INR today: Subtherapeutic  PLAN Weekly dose was increased by ~20% to 37.5 mg per week. Patient also advised to bridge with Lovenox and follow up for repeat INR on 06/04/16 with Dr. Maudie Mercury.   There are no Patient Instructions on file for this  visit. Patient advised to contact clinic or seek medical attention if signs/symptoms of bleeding or thromboembolism occur.  Patient verbalized understanding by repeating back information and was advised to contact me if further medication-related questions arise. Patient was also provided an information handout.  Follow-up No Follow-up on file.  [name]  30 minutes spent face-to-face with the patient during the encounter. 75% of time spent on education. 25% of time was spent on collecting INR.

## 2016-06-04 ENCOUNTER — Ambulatory Visit (INDEPENDENT_AMBULATORY_CARE_PROVIDER_SITE_OTHER): Payer: Medicare HMO | Admitting: Pharmacist

## 2016-06-04 ENCOUNTER — Ambulatory Visit
Admission: RE | Admit: 2016-06-04 | Discharge: 2016-06-04 | Disposition: A | Discharge status: Home / Self Care | Payer: Medicare HMO | Source: Ambulatory Visit | Attending: Neurology | Admitting: Neurology

## 2016-06-04 DIAGNOSIS — Z862 Personal history of diseases of the blood and blood-forming organs and certain disorders involving the immune mechanism: Secondary | ICD-10-CM

## 2016-06-04 DIAGNOSIS — E039 Hypothyroidism, unspecified: Secondary | ICD-10-CM

## 2016-06-04 DIAGNOSIS — Z7901 Long term (current) use of anticoagulants: Secondary | ICD-10-CM | POA: Diagnosis not present

## 2016-06-04 DIAGNOSIS — D6861 Antiphospholipid syndrome: Secondary | ICD-10-CM

## 2016-06-04 DIAGNOSIS — R760 Raised antibody titer: Secondary | ICD-10-CM | POA: Diagnosis not present

## 2016-06-04 DIAGNOSIS — R938 Abnormal findings on diagnostic imaging of other specified body structures: Secondary | ICD-10-CM

## 2016-06-04 DIAGNOSIS — M79605 Pain in left leg: Secondary | ICD-10-CM

## 2016-06-04 DIAGNOSIS — R9389 Abnormal findings on diagnostic imaging of other specified body structures: Secondary | ICD-10-CM

## 2016-06-04 LAB — POCT INR: INR: 1.9

## 2016-06-04 MED ORDER — GADOBENATE DIMEGLUMINE 529 MG/ML IV SOLN
9.0000 mL | Freq: Once | INTRAVENOUS | Status: AC | PRN
  Administered 2016-06-04: 9 mL via INTRAVENOUS

## 2016-06-04 NOTE — Progress Notes (Signed)
Anticoagulation Management [name] is a 70 y.o. female who reports to the clinic for monitoring of warfarin treatment.    Indication: History of DVT, TIA, lupus anticoagulant positive Duration: indefinite  Anticoagulation Clinic Visit History: Patient does not report signs/symptoms of bleeding or thromboembolism   Anticoagulation Episode Summary    Current INR goal:   2.0-3.0  TTR:   68.4 % (4.9 y)  Next INR check:   06/07/2016  INR from last check:   1.9! 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] Lupus anticoagulant positive [R76.0]       Comments:         Anticoagulation Care Providers    Provider Role Specialty Phone number   Annia Belt, MD Referring Oncology [phone]     ASSESSMENT Recent Results: The most recent result is correlated with 32.5 mg per week: Lab Results  Component Value Date   INR 1.9 06/04/2016   INR 1.6 06/01/2016   INR 2.4 04/29/2016   PROTIME 30.0 (H) 03/13/2015    Anticoagulation Dosing: INR as of 06/04/2016 and Previous Dosing Information    INR Dt INR Goal Wkly Tot Sun Mon Tue Wed Thu Fri Sat   06/04/2016 1.9 2.0-3.0 37.5 mg 5 mg 5 mg 7.5 mg 5 mg 5 mg 5 mg 5 mg   Patient deviated from recommended dosing.       Anticoagulation Dose Instructions as of 06/04/2016      Total Sun Mon Tue Wed Thu Fri Sat   New Dose 40 mg 5 mg 5 mg 7.5 mg 5 mg 5 mg 7.5 mg 5 mg     (5 mg x 1)  (5 mg x 1)  (5 mg x 1.5)  (5 mg x 1)  (5 mg x 1)  (5 mg x 1.5)  (5 mg x 1)                           INR today: Therapeutic  PLAN Weekly dose was increased by 7% to 40 mg per week  Patient Instructions  Patient educated about medication as defined in this encounter and verbalized understanding by repeating back instructions provided.   Patient advised to contact  clinic or seek medical attention if signs/symptoms of bleeding or thromboembolism occur.  Patient verbalized understanding by repeating back information and was advised to contact me if further medication-related questions arise. Patient was also provided an information handout.  Follow-up 3 days  Mikinzie Maciejewski J  15 minutes spent face-to-face with the patient during the encounter. 50% of time spent on education. 50% of time was spent on assessment and plan.

## 2016-06-04 NOTE — Patient Instructions (Signed)
Patient educated about medication as defined in this encounter and verbalized understanding by repeating back instructions provided.   

## 2016-06-04 NOTE — Progress Notes (Unsigned)
Patient was seen in clinic with [name], PharmD candidate. I agree with the assessment and plan of care documented.  Patient characterized blood in stool as mild and transient; occurred 3 times last week but none this week.

## 2016-06-06 NOTE — Progress Notes (Signed)
Reviewed Thanks DrG 

## 2016-06-07 ENCOUNTER — Telehealth: Payer: Self-pay | Admitting: Neurology

## 2016-06-07 ENCOUNTER — Ambulatory Visit (INDEPENDENT_AMBULATORY_CARE_PROVIDER_SITE_OTHER): Payer: Medicare HMO | Admitting: Pharmacist

## 2016-06-07 DIAGNOSIS — Z7901 Long term (current) use of anticoagulants: Secondary | ICD-10-CM

## 2016-06-07 DIAGNOSIS — D6861 Antiphospholipid syndrome: Secondary | ICD-10-CM

## 2016-06-07 DIAGNOSIS — R760 Raised antibody titer: Secondary | ICD-10-CM | POA: Diagnosis not present

## 2016-06-07 LAB — POCT INR: INR: 2.2

## 2016-06-07 MED ORDER — WARFARIN SODIUM 5 MG PO TABS: 5.0000 mg | ORAL_TABLET | ORAL | 3 refills | Status: DC

## 2016-06-07 NOTE — Progress Notes (Signed)
Anticoagulation Management [name] is a 70 y.o. female who reports to the clinic for monitoring of warfarin treatment.    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] Lupus anticoagulant positive [R76.0]       Comments:         Anticoagulation Care Providers    Provider Role Specialty Phone number   Annia Belt, MD Referring Oncology [phone]     ASSESSMENT Recent Results: The most recent result is correlated with 35 mg per week: Lab Results  Component Value Date   INR 2.2 06/07/2016   INR 1.9 06/04/2016   INR 1.6 06/01/2016   PROTIME 30.0 (H) 03/13/2015   Anticoagulation Dosing: INR as of 06/07/2016 and Previous Dosing Information    INR Dt INR Goal Wkly Tot Sun Mon Tue Wed Thu Fri Sat   06/07/2016 2.2 2.0-3.0 35 mg 5 mg 5 mg 5 mg 2.5 mg 5 mg 7.5 mg 5 mg   Patient deviated from recommended dosing.       Anticoagulation Dose Instructions as of 06/07/2016      Total Sun Mon Tue Wed Thu Fri Sat   New Dose 35 mg 5 mg 5 mg 5 mg 5 mg 5 mg 5 mg 5 mg     (5 mg x 1)  (5 mg x 1)  (5 mg x 1)  (5 mg x 1)  (5 mg x 1)  (5 mg x 1)  (5 mg x 1)                           INR today: Therapeutic  PLAN Weekly dose was unchanged  Patient Instructions  Patient educated about medication as defined in this encounter and verbalized understanding by repeating back instructions provided.    Patient advised to contact clinic or seek medical attention if signs/symptoms of bleeding or  thromboembolism occur.  Patient verbalized understanding by repeating back information and was advised to contact me if further medication-related questions arise. Patient was also provided an information handout.  Follow-up Return in about 4 weeks (around 07/05/2016) for Follow up INR around 07/05/16 11am.  Kim,Jennifer J  15 minutes spent face-to-face with the patient during the encounter. 50% of time spent on education. 50% of time was spent on assessment and plan.

## 2016-06-07 NOTE — Telephone Encounter (Signed)
Spoke to patient - she is aware of results and will keep her follow up for further discussion.

## 2016-06-07 NOTE — Telephone Encounter (Signed)
Please call patient MRI of the brain showed resolution of previously mild abnormality.  I will review MRI film at her next follow-up visit on December twelfth 2017.  IMPRESSION:  This MRI of the width without contrast shows the following: 1.   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 2.   There are T2/FLAIR hyperintense foci in the hemispheres and the pons.   None of these appear to be new. 3.   Mild chronic right maxillary sinusitis. 4.   There is a normal enhancement pattern on the current study in no acute findings.

## 2016-06-07 NOTE — Patient Instructions (Signed)
Patient educated about medication as defined in this encounter and verbalized understanding by repeating back instructions provided.   

## 2016-06-07 NOTE — Progress Notes (Signed)
Reviewed Thanks DrG 

## 2016-06-14 ENCOUNTER — Telehealth: Payer: Self-pay | Admitting: Pharmacist

## 2016-06-14 NOTE — Progress Notes (Signed)
Patient called to inform me she has been started on new medications to treat a gout exacerbation. Scheduled patient an appointment for warfarin monitoring 06/21/16

## 2016-06-21 ENCOUNTER — Ambulatory Visit: Payer: Medicare HMO

## 2016-07-06 ENCOUNTER — Encounter: Payer: Self-pay | Admitting: Neurology

## 2016-07-06 ENCOUNTER — Ambulatory Visit (INDEPENDENT_AMBULATORY_CARE_PROVIDER_SITE_OTHER): Payer: Medicare HMO | Admitting: Neurology

## 2016-07-06 VITALS — BP 135/77 | HR 75 | Ht 64.5 in | Wt 188.8 lb

## 2016-07-06 DIAGNOSIS — H539 Unspecified visual disturbance: Secondary | ICD-10-CM | POA: Diagnosis not present

## 2016-07-06 DIAGNOSIS — R930 Abnormal findings on diagnostic imaging of skull and head, not elsewhere classified: Secondary | ICD-10-CM

## 2016-07-06 DIAGNOSIS — G2581 Restless legs syndrome: Secondary | ICD-10-CM | POA: Diagnosis not present

## 2016-07-06 NOTE — Progress Notes (Signed)
PATIENT: [name] DOB: [birth year]  Chief Complaint  Patient presents with  . Left-sided paresthesia/diplopia    She is here with her sister, Constance Holster, to review her MRI results.  Her symptoms have improved.     HISTORICAL  [name] is a 70 year old right-handed female, seen in refer by her primary care from [name] for evaluation of one episode of severe headache nausea or vomiting, initial evaluation was April 29 2016.  She had antiphospholipid syndrome, had history of digital blood clot, has been on chronic Coumadin treatment for many years, anxiety, hypothyroidism, on supplement, she has been on Requip 1 mg twice a day since 2014 for restless leg syndrome, with sub optimal control of her symptoms, she was recently tried on gabapentin, was not sure about the benefit, previously was treated with gabapentin up to 1800 mg a day without significant improvement, right breast cancer, s/p lobectomy, followed by chemo and radiation therapy in 2000.  She lives with her mother, who suffered severe dementia, she is the main caregiver  I reviewed and summarized hospital record, she was admitted to the hospital on April 13 2016, prior to admission, she complained few days history of headaches, had right parietal region, nausea or vomiting, also noticed multiple visions when looking to the right side, upon presentation, she also complains of left-sided numbness, INR upon presentation was 1.6, I personally reviewed  CT scan of her head showed no acute intracranial abnormality. MRI of her brain showed no signs of an acute stroke. 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  She has been afebrile, she denies stiff neck   Laboratory showed LDL 118, normal CBC, hemoglobin of 14 point 1, normal CMP, with creatinine  of 1.42, A1c 6.0, most recent INR on September 29th 7017 was 2.2, heparin unfractionated 0.5, CBC showed hemoglobin 11 point 5, elevated WBC 18 point 8, C-reactive protein 1.3, ESR 11, negative RPR,  Spinal fluid testing RBC 3, WBC 12, lymphocyte 98%, monocytes 2% 0 oligoclonal banding,, total protein is 40, glucose was mildly elevated 74, angiotension converting enzyme 1.4, TSH was elevated 45, and LDL was 118, triglycerides 231, A1c 6.0,  She was evaluated by neuro hospitalists, was given Solu-Medrol 500 mg for 3 consecutive days, she did report significant improvement, she can move her eyes better, no longer has significant double vision. She no longer has significant left sided numbness, but complains of fatigue,  UPDATE Jul 06 2016: We have personally reviewed MRI of the brain with and without contrast on June 06 2016, there was resolution of enhancing perivascular foci noted on previous MRI in September 2017.  She continue have significant restless leg symptoms, at nighttime, sometimes woke up from sleep, urge to move, difficulty falling to sleep, prolonged car ride will also trigger her symptoms, she is taking gabapentin 300 mg 3 times a day, Requip 1 mg 3 times a day,  REVIEW OF SYSTEMS: Full 14 system review of systems performed and notable only for joint pain, back pain, muscle cramps, depression anxiety hearing loss, ringing ears,  ALLERGIES: Allergies  Allergen Reactions  . Bee Venom Anaphylaxis  . Hornet Venom Anaphylaxis    Anaphylaxis Shock SHOCK  . Reglan [Metoclopramide] Shortness Of Breath and Other (See Comments)    Tongue got numb; didn't feel good  . Vitamin K Anaphylaxis and Rash    "kills me" iv  .  Benadryl [Diphenhydramine Hcl]     Rash  . Crestor [Rosuvastatin Calcium]     Muscle Aches  . Doxycycline     Tongue burning, thrush  . Levofloxacin     Rash  . Zocor [Simvastatin]     Muscle Aches  . Ciprofloxacin Rash  . Daypro [Oxaprozin] Rash    Rash  .  Phytonadione Rash  . Sulfonamide Derivatives Rash    HOME MEDICATIONS: Current Outpatient Prescriptions  Medication Sig Dispense Refill  . ALPRAZolam (XANAX) 0.5 MG tablet Take 0.5 mg by mouth 2 (two) times daily as needed for sleep. Every evening and sometimes during the day if needed    . aspirin 81 MG tablet Take 81 mg by mouth daily at 6 PM.     . buPROPion (WELLBUTRIN SR) 200 MG 12 hr tablet Take 200 mg by mouth 2 (two) times daily.     . Calcium Carbonate-Vitamin D (CALCIUM + D PO) Take 600 mg by mouth daily.     . fluticasone (FLONASE) 50 MCG/ACT nasal spray Place 2 sprays into the nose daily as needed for allergies.     [name] gabapentin (NEURONTIN) 300 MG capsule Take 300 mg by mouth 2 (two) times daily.    [name] HYDROcodone-acetaminophen (NORCO/VICODIN) 5-325 MG per tablet Take 1 tablet by mouth at bedtime.     [name] levothyroxine (SYNTHROID, LEVOTHROID) 125 MCG tablet daily.    . Magnesium 400 MG TABS Take 400 mg by mouth daily.    . metoprolol succinate (TOPROL-XL) 25 MG 24 hr tablet Take 12.5 mg by mouth 2 (two) times daily.     [name] omeprazole (PRILOSEC) 20 MG capsule Take 20 mg by mouth daily.     . pravastatin (PRAVACHOL) 20 MG tablet Take 20 mg by mouth daily.    [name] rOPINIRole (REQUIP) 1 MG tablet Take 1 mg by mouth 2 (two) times daily.     [name] warfarin (COUMADIN) 5 MG tablet Take 1 tablet (5 mg total) by mouth See admin instructions. 30 tablet 3   No current facility-administered medications for this visit.     PAST MEDICAL HISTORY: Past Medical History:  Diagnosis Date  . Anxiety    severe panic attacks  . Arthritis   . Aseptic meningitis   . Asthma    as child  . Breast cancer ([place])    right  . Chest pain   . Chronic anticoagulation 08/04/2015  . Collapsed lung    hx of, left  . Complication of anesthesia    "hard to put asleep"  . Depression   . Fibromyalgia   . GERD (gastroesophageal reflux disease)   . H/O hiatal hernia   . Headache(784.0)   . Hepatitis 1990   "from  eating at restaurant"  . History of antiphospholipid antibody syndrome   . 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  . Hx-TIA (transient ischemic attack)   . Hyperlipidemia   . Hypertension   . Hypothyroidism   . Neuromuscular disorder ([place])   . Pneumonia    hx of  . Stroke ([place])   . Thrombosis, upper extremity artery ([place]) 04/17/2012   Left digital artery  October 1998 - new dx antiphospholipid antibody syndrome    PAST SURGICAL HISTORY: Past Surgical History:  Procedure Laterality Date  . ABDOMINAL HYSTERECTOMY    . BREAST SURGERY Right    x3  . BUNIONECTOMY Bilateral 30 years ago  . CARDIAC CATHETERIZATION  07/12/1997   NORMAL LEFT  VENTRICULAR FUNCTION WITH EF AT LEAST 70-75%  . ESOPHAGEAL MANOMETRY N/A 11/06/2012   Procedure: ESOPHAGEAL MANOMETRY (EM);  Surgeon: Pedro Earls, MD;  Location: [facility];  Service: General;  Laterality: N/A;  . ESOPHAGOGASTRODUODENOSCOPY ENDOSCOPY    . KNEE ARTHROSCOPY Right   . LAPAROSCOPIC NISSEN FUNDOPLICATION N/A 2/59/5638   Procedure: LAPAROSCOPIC TAKEDOWN  PERI-HIATAL HERNIA    REPAIR;  Surgeon: Pedro Earls, MD;  Location: [facility];  Service: General;  Laterality: N/A;  . [place]  . RIGHT OOPHORECTOMY  1980  . THYROIDECTOMY  in 20's  . TONSILLECTOMY  70 years old  . UPPER GI ENDOSCOPY N/A 12/12/2012   Procedure: UPPER GI ENDOSCOPY;  Surgeon: Pedro Earls, MD;  Location: [facility];  Service: General;  Laterality: N/A;    FAMILY HISTORY: Family History  Problem Relation Age of Onset  . Alzheimer's disease Mother   . Heart disease Mother   . Hyperlipidemia Mother   . Hypertension Mother   . Cancer Maternal Grandmother     colon  . Heart disease Sister   . Hypertension Sister   . Heart attack Sister   . Heart disease Brother   . Heart attack Brother   . Cancer Daughter   . Hypertension Daughter   . Hypertension Son     SOCIAL HISTORY:  Social History   Social History  . Marital  status: Single    Spouse name: N/A  . Number of children: 2  . Years of education: 11   Occupational History  . Not on file.   Social History Main Topics  . Smoking status: Never Smoker  . Smokeless tobacco: Never Used  . Alcohol use No  . Drug use: No  . Sexual activity: Not on file   Other Topics Concern  . Not on file   Social History Narrative   Lives at home with her mother.  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   Right-handed.   2-4 cups caffeine daily.        PHYSICAL EXAM   Vitals:   07/06/16 1311  BP: 135/77  Pulse: 75  Weight: 188 lb 12 oz (85.6 kg)  Height: 5' 4.5" (1.638 m)    Not recorded      Body mass index is 31.9 kg/m.  PHYSICAL EXAMNIATION:  Gen: NAD, conversant, well nourised, obese, well groomed                     Cardiovascular: Regular rate rhythm, no peripheral edema, warm, nontender. Eyes: Conjunctivae clear without exudates or hemorrhage Neck: Supple, no carotid bruise. Pulmonary: Clear to auscultation bilaterally   NEUROLOGICAL EXAM:  MENTAL STATUS: Speech:    Speech is normal; fluent and spontaneous with normal comprehension.  Cognition:     Orientation to time, place and person     Normal recent and remote memory     Normal Attention span and concentration     Normal Language, naming, repeating,spontaneous speech     Fund of knowledge   CRANIAL NERVES: CN II: Visual fields are full to confrontation. Fundoscopic exam is normal with sharp discs and no vascular changes. Pupils are round equal and briskly reactive to light. CN III, IV, VI: extraocular movement are normal. No ptosis. CN V: Facial sensation is intact to pinprick in all 3 divisions bilaterally. Corneal responses are intact.  CN VII: Face is symmetric with normal eye closure and smile. CN VIII: Hearing is normal to  rubbing fingers CN IX, X: Palate elevates symmetrically. Phonation is normal. CN XI: Head turning and shoulder shrug are  intact CN XII: Tongue is midline with normal movements and no atrophy.  MOTOR: There is no pronator drift of out-stretched arms. Muscle bulk and tone are normal. Muscle strength is normal.  REFLEXES: Reflexes are 2+ and symmetric at the biceps, triceps, knees, and ankles. Plantar responses are flexor.  SENSORY: Intact to light touch, pinprick, positional sensation and vibratory sensation are intact in fingers and toes.  COORDINATION: Rapid alternating movements and fine finger movements are intact. There is no dysmetria on finger-to-nose and heel-knee-shin.    GAIT/STANCE: Posture is normal. Gait is steady with normal steps, base, arm swing, and turning. Heel and toe walking are normal. Tandem gait is normal.  Romberg is absent.   DIAGNOSTIC DATA (LABS, IMAGING, TESTING) - I reviewed patient records, labs, notes, testing and imaging myself where available.   ASSESSMENT AND PLAN  [name] is a 70 y.o. 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  She did response to IV Solu-Medrol 500 mg for 3 consecutive days in September 2017 Repeat MRI of the brain with without contrast in November 2017 showed significant improvement Restless leg syndrome Try to avoid long-term large dose dopamine agonist to avoid augmentation phenomenon, Gabapentin, Lyrica, would be a better option,  Marcial Pacas, M.D. Ph.D.  [facility] [address] [place], [place] [address] Ph: [phone] Fax: [phone]  PZ:[id number] Joni Reining, PA-C

## 2016-07-27 ENCOUNTER — Other Ambulatory Visit: Payer: Self-pay | Admitting: Oncology

## 2016-07-27 DIAGNOSIS — Z1231 Encounter for screening mammogram for malignant neoplasm of breast: Secondary | ICD-10-CM

## 2016-07-27 DIAGNOSIS — Z853 Personal history of malignant neoplasm of breast: Secondary | ICD-10-CM

## 2016-07-28 ENCOUNTER — Encounter: Payer: Self-pay | Admitting: *Deleted

## 2016-07-28 ENCOUNTER — Telehealth: Payer: Self-pay | Admitting: Neurology

## 2016-07-28 NOTE — Telephone Encounter (Addendum)
Spoke to patient - her RLS/pain is much worse in her bilateral legs. She has failed gabapentin and Lyrica in the past.  She is currently taking hydrocodone-apap 5-325mg , one tab BID (being prescribed by her oncologist).  Says oxycodone was discussed at her last office visit here (to help w/ symptoms in legs).  She did not want to try it at that time but has reconsidered and is asking for a prescription to replace hydrocodone. She is aware that Dr. Krista Blue will address this concern upon her return on 07/29/16.  Checked her [place] narcotic registry - hydrocodone 5-325mg  and alprazolam 0.5mg  are the only two controlled substances prescribed since April 2017 (all medications given by the same office).

## 2016-07-28 NOTE — Telephone Encounter (Signed)
Pt called said the hydrocodone is not working any more or the gabapentin for leg and feet pain. She is wanting to try oxycodone per Dr Krista Blue.

## 2016-07-29 MED ORDER — OXYCODONE HCL ER 10 MG PO T12A: 10.0000 mg | EXTENDED_RELEASE_TABLET | Freq: Two times a day (BID) | ORAL | 0 refills | Status: DC

## 2016-07-29 NOTE — Telephone Encounter (Signed)
Returned call to patient - she verbalized understand on how to titrate off of her Requip.  She will start using oxycodone ER at [facility] (prescription placed up front for pick up).  An appt was scheduled for 09/08/16.

## 2016-07-29 NOTE — Telephone Encounter (Signed)
I saw her on Jul 06 2016, I did order Oxycodone Er 10mg  every night for her restless leg symptoms.   At the same time, she should gradually cut back her requip 1mg  twice a day now, to Requip 1mg  qhs xone week, and Requip 1mg  1/2 tab qhs.  Please give her a follow up appt in 4- 6 weeks

## 2016-07-29 NOTE — Addendum Note (Signed)
Addended by: Marcial Pacas on: 07/29/2016 09:54 AM   Modules accepted: Orders

## 2016-08-02 ENCOUNTER — Ambulatory Visit (INDEPENDENT_AMBULATORY_CARE_PROVIDER_SITE_OTHER): Payer: Medicare HMO | Admitting: Pharmacist

## 2016-08-02 ENCOUNTER — Other Ambulatory Visit (INDEPENDENT_AMBULATORY_CARE_PROVIDER_SITE_OTHER): Payer: Medicare HMO

## 2016-08-02 ENCOUNTER — Other Ambulatory Visit: Payer: Medicare HMO

## 2016-08-02 DIAGNOSIS — D6861 Antiphospholipid syndrome: Secondary | ICD-10-CM | POA: Diagnosis not present

## 2016-08-02 DIAGNOSIS — R760 Raised antibody titer: Secondary | ICD-10-CM | POA: Diagnosis not present

## 2016-08-02 DIAGNOSIS — Z7901 Long term (current) use of anticoagulants: Secondary | ICD-10-CM

## 2016-08-02 DIAGNOSIS — I742 Embolism and thrombosis of arteries of the upper extremities: Secondary | ICD-10-CM

## 2016-08-02 LAB — COMPREHENSIVE METABOLIC PANEL
ALT: 17 U/L (ref 14–54)
AST: 22 U/L (ref 15–41)
Albumin: 4 g/dL (ref 3.5–5.0)
Alkaline Phosphatase: 86 U/L (ref 38–126)
Anion gap: 9 (ref 5–15)
BUN: 18 mg/dL (ref 6–20)
CO2: 24 mmol/L (ref 22–32)
Calcium: 9.2 mg/dL (ref 8.9–10.3)
Chloride: 106 mmol/L (ref 101–111)
Creatinine, Ser: 1.53 mg/dL — ABNORMAL HIGH (ref 0.44–1.00)
GFR calc Af Amer: 39 mL/min — ABNORMAL LOW (ref >60)
GFR calc non Af Amer: 34 mL/min — ABNORMAL LOW (ref >60)
Glucose, Bld: 124 mg/dL — ABNORMAL HIGH (ref 65–99)
Potassium: 4.7 mmol/L (ref 3.5–5.1)
Sodium: 139 mmol/L (ref 135–145)
Total Bilirubin: 0.8 mg/dL (ref 0.3–1.2)
Total Protein: 6.3 g/dL — ABNORMAL LOW (ref 6.5–8.1)

## 2016-08-02 LAB — CBC WITH DIFFERENTIAL/PLATELET
Basophils Absolute: 0 10*3/uL (ref 0.0–0.1)
Basophils Relative: 0 %
Eosinophils Absolute: 0.2 10*3/uL (ref 0.0–0.7)
Eosinophils Relative: 3 %
HCT: 37.6 % (ref 36.0–46.0)
Hemoglobin: 12.7 g/dL (ref 12.0–15.0)
Lymphocytes Relative: 30 %
Lymphs Abs: 2.5 10*3/uL (ref 0.7–4.0)
MCH: 31.2 pg (ref 26.0–34.0)
MCHC: 33.8 g/dL (ref 30.0–36.0)
MCV: 92.4 fL (ref 78.0–100.0)
Monocytes Absolute: 0.6 10*3/uL (ref 0.1–1.0)
Monocytes Relative: 8 %
Neutro Abs: 4.8 10*3/uL (ref 1.7–7.7)
Neutrophils Relative %: 59 %
Platelets: 252 10*3/uL (ref 150–400)
RBC: 4.07 MIL/uL (ref 3.87–5.11)
RDW: 13.2 % (ref 11.5–15.5)
WBC: 8.2 10*3/uL (ref 4.0–10.5)

## 2016-08-02 LAB — POCT INR: INR: 2

## 2016-08-02 MED ORDER — WARFARIN SODIUM 5 MG PO TABS: ORAL_TABLET | ORAL | 3 refills | Status: DC

## 2016-08-02 NOTE — Progress Notes (Signed)
Anti-Coagulation Progress Note  [name] is a 71 y.o. female who is currently on an anti-coagulation regimen.    RECENT RESULTS: Recent results are below, the most recent result is correlated with a dose of 35 mg. per week: Lab Results  Component Value Date   INR 2.0 08/02/2016   INR 2.2 06/07/2016   INR 1.9 06/04/2016   PROTIME 30.0 (H) 03/13/2015    ANTI-COAG DOSE: Anticoagulation Dose Instructions as of 08/02/2016      [name] Tue Wed Thu Fri Sat   New Dose 5 mg 7.5 mg 5 mg 5 mg 5 mg 5 mg 5 mg    Description   Take 1 tablet by mouth once daily EXCEPT Mondays take 1 & 1/2 tablets once daily each week.      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.61] Lupus anticoagulant positive [R76.0]       Comments:         Anticoagulation Care Providers    Provider Role Specialty Phone number   Annia Belt, MD Referring Oncology [phone]      ANTICOAG TODAY: Anticoagulation Summary  As of 08/02/2016   INR goal:   2.0-3.0  TTR:     Today's INR:   2.0  Next INR check:   08/23/2016  Target end date:      Indications   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    FOLLOW-UP Return in about 3 weeks (around 08/23/2016) for INR F/U.  Arrie Senate, PharmD PGY-1 Pharmacy Resident Pager: [phone] 08/02/2016

## 2016-08-02 NOTE — Addendum Note (Signed)
Addended by: Truddie Crumble on: 08/02/2016 08:45 AM   Modules accepted: Orders

## 2016-08-02 NOTE — Patient Instructions (Signed)
Patient instructed to take medications as defined in the Anti-coagulation Track section of this encounter.  Patient instructed to take today's dose.  Patient verbalized understanding of these instructions.    

## 2016-08-03 NOTE — Progress Notes (Signed)
INTERNAL MEDICINE TEACHING ATTENDING ADDENDUM - Lucious Groves, DO Duration- indefinate, Indication- antiphospholipid ab syndrome, INR-  Lab Results  Component Value Date   INR 2.0 08/02/2016   PROTIME 30.0 (H) 03/13/2015  . Agree with pharmacy recommendations as outlined in their note.

## 2016-08-10 ENCOUNTER — Encounter: Payer: Self-pay | Admitting: Oncology

## 2016-08-10 ENCOUNTER — Ambulatory Visit (INDEPENDENT_AMBULATORY_CARE_PROVIDER_SITE_OTHER): Payer: Medicare HMO | Admitting: Oncology

## 2016-08-10 VITALS — BP 130/70 | HR 68 | Temp 97.7°F | Ht 65.0 in | Wt 186.1 lb

## 2016-08-10 DIAGNOSIS — Z923 Personal history of irradiation: Secondary | ICD-10-CM

## 2016-08-10 DIAGNOSIS — F418 Other specified anxiety disorders: Secondary | ICD-10-CM | POA: Diagnosis not present

## 2016-08-10 DIAGNOSIS — D6861 Antiphospholipid syndrome: Secondary | ICD-10-CM | POA: Diagnosis not present

## 2016-08-10 DIAGNOSIS — Z881 Allergy status to other antibiotic agents status: Secondary | ICD-10-CM

## 2016-08-10 DIAGNOSIS — Z882 Allergy status to sulfonamides status: Secondary | ICD-10-CM

## 2016-08-10 DIAGNOSIS — R760 Raised antibody titer: Secondary | ICD-10-CM

## 2016-08-10 DIAGNOSIS — Z170 Estrogen receptor positive status [ER+]: Secondary | ICD-10-CM

## 2016-08-10 DIAGNOSIS — Z91030 Bee allergy status: Secondary | ICD-10-CM

## 2016-08-10 DIAGNOSIS — Z853 Personal history of malignant neoplasm of breast: Secondary | ICD-10-CM

## 2016-08-10 DIAGNOSIS — Z8673 Personal history of transient ischemic attack (TIA), and cerebral infarction without residual deficits: Secondary | ICD-10-CM

## 2016-08-10 DIAGNOSIS — R930 Abnormal findings on diagnostic imaging of skull and head, not elsewhere classified: Secondary | ICD-10-CM

## 2016-08-10 DIAGNOSIS — I742 Embolism and thrombosis of arteries of the upper extremities: Secondary | ICD-10-CM | POA: Diagnosis not present

## 2016-08-10 DIAGNOSIS — Z888 Allergy status to other drugs, medicaments and biological substances status: Secondary | ICD-10-CM

## 2016-08-10 DIAGNOSIS — Z7901 Long term (current) use of anticoagulants: Secondary | ICD-10-CM | POA: Diagnosis not present

## 2016-08-10 DIAGNOSIS — D688 Other specified coagulation defects: Secondary | ICD-10-CM

## 2016-08-10 DIAGNOSIS — Z8719 Personal history of other diseases of the digestive system: Secondary | ICD-10-CM

## 2016-08-10 DIAGNOSIS — Z8679 Personal history of other diseases of the circulatory system: Secondary | ICD-10-CM

## 2016-08-10 DIAGNOSIS — C50511 Malignant neoplasm of lower-outer quadrant of right female breast: Secondary | ICD-10-CM
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
## 2016-08-10 NOTE — Patient Instructions (Signed)
Continue PT/INR here every month MD visit 6 months Lab 1-2 weeks before visit

## 2016-08-17 ENCOUNTER — Ambulatory Visit
Admission: RE | Admit: 2016-08-17 | Discharge: 2016-08-17 | Disposition: A | Discharge status: Home / Self Care | Payer: Medicare HMO | Source: Ambulatory Visit | Attending: Oncology | Admitting: Oncology

## 2016-08-17 DIAGNOSIS — Z853 Personal history of malignant neoplasm of breast: Secondary | ICD-10-CM

## 2016-08-17 DIAGNOSIS — Z1231 Encounter for screening mammogram for malignant neoplasm of breast: Secondary | ICD-10-CM

## 2016-08-23 ENCOUNTER — Ambulatory Visit: Payer: Medicare HMO

## 2016-08-23 ENCOUNTER — Telehealth: Payer: Self-pay | Admitting: Family Medicine

## 2016-08-23 NOTE — Telephone Encounter (Signed)
APT. REMINDER CALL, LMTCB °

## 2016-08-24 ENCOUNTER — Ambulatory Visit: Payer: Medicare HMO | Admitting: Pharmacist

## 2016-08-31 ENCOUNTER — Other Ambulatory Visit (HOSPITAL_COMMUNITY): Payer: Self-pay | Admitting: Physician Assistant

## 2016-08-31 ENCOUNTER — Ambulatory Visit (INDEPENDENT_AMBULATORY_CARE_PROVIDER_SITE_OTHER): Payer: Medicare HMO | Admitting: Pharmacist

## 2016-08-31 DIAGNOSIS — R1010 Upper abdominal pain, unspecified: Secondary | ICD-10-CM

## 2016-08-31 DIAGNOSIS — Z7901 Long term (current) use of anticoagulants: Secondary | ICD-10-CM

## 2016-08-31 DIAGNOSIS — R760 Raised antibody titer: Secondary | ICD-10-CM

## 2016-08-31 DIAGNOSIS — D6861 Antiphospholipid syndrome: Secondary | ICD-10-CM | POA: Diagnosis not present

## 2016-08-31 LAB — POCT INR: INR: 4.1

## 2016-08-31 NOTE — Progress Notes (Signed)
Anticoagulation Management [name] is a 71 y.o. female who reports to the clinic for monitoring of warfarin treatment.    Indication: antiphospholipid antibody, lupus anticoag positive, history of DVT  Duration: indefinite Supervising physician: Murriel Hopper  Anticoagulation Clinic Visit History: Patient does not report signs/symptoms of bleeding or thromboembolism  Anticoagulation Episode Summary    Current INR goal:   2.0-3.0  TTR:   69.0 % (5.2 y)  Next INR check:   09/08/2016  INR from last check:   4.1! 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] Lupus anticoagulant positive [R76.0]       Comments:         Anticoagulation Care Providers    Provider Role Specialty Phone number   Annia Belt, MD Referring Oncology [phone]     ASSESSMENT Recent Results: The most recent result is correlated with 37.5 mg per week: Lab Results  Component Value Date   INR 4.1 08/31/2016   INR 2.0 08/02/2016   INR 2.2 06/07/2016   PROTIME 30.0 (H) 03/13/2015   Anticoagulation Dosing: INR as of 08/31/2016 and Previous Dosing Information    INR Dt INR Goal Wkly Tot Sun Mon Tue Wed Thu Fri Sat   08/31/2016 4.1 2.0-3.0 37.5 mg 5 mg 7.5 mg 5 mg 5 mg 5 mg 5 mg 5 mg    Previous description   Take 1 tablet by mouth once daily EXCEPT Mondays take 1 & 1/2 tablets once daily each week.   Anticoagulation Dose Instructions as of 08/31/2016      Total Sun Mon Tue Wed Thu Fri Sat   New Dose 32.5 mg 5 mg 5 mg 2.5 mg 5 mg 5 mg 5 mg 5 mg     (5 mg x 1)  (5 mg x 1)  (5 mg x 0.5)  (5 mg x 1)  (5 mg x 1)  (5 mg x 1)  (5 mg x 1)                           INR today: Therapeutic  PLAN Weekly dose was decreased by 13%  Patient Instructions  Patient educated about medication as defined in this encounter and  verbalized understanding by repeating back instructions provided.   Patient advised to contact clinic or seek medical attention if signs/symptoms of bleeding or thromboembolism occur.  Patient verbalized understanding by repeating back information and was advised to contact me if further medication-related questions arise. Patient was also provided an information handout.  Follow-up 1 weeks  [name]

## 2016-08-31 NOTE — Patient Instructions (Signed)
Patient educated about medication as defined in this encounter and verbalized understanding by repeating back instructions provided.   

## 2016-08-31 NOTE — Progress Notes (Signed)
Reviewed Thanks DrG 

## 2016-09-01 ENCOUNTER — Ambulatory Visit (HOSPITAL_COMMUNITY)
Admission: RE | Admit: 2016-09-01 | Discharge: 2016-09-01 | Disposition: A | Discharge status: Home / Self Care | Payer: Medicare HMO | Source: Ambulatory Visit | Attending: Physician Assistant | Admitting: Physician Assistant

## 2016-09-01 DIAGNOSIS — R1010 Upper abdominal pain, unspecified: Secondary | ICD-10-CM

## 2016-09-01 DIAGNOSIS — R1012 Left upper quadrant pain: Secondary | ICD-10-CM | POA: Insufficient documentation

## 2016-09-01 DIAGNOSIS — R1011 Right upper quadrant pain: Secondary | ICD-10-CM | POA: Diagnosis present

## 2016-09-01 DIAGNOSIS — K760 Fatty (change of) liver, not elsewhere classified: Secondary | ICD-10-CM | POA: Insufficient documentation

## 2016-09-08 ENCOUNTER — Ambulatory Visit: Payer: Self-pay | Admitting: Neurology

## 2016-09-08 ENCOUNTER — Ambulatory Visit (INDEPENDENT_AMBULATORY_CARE_PROVIDER_SITE_OTHER): Payer: Medicare HMO | Admitting: Pharmacist

## 2016-09-08 ENCOUNTER — Encounter (INDEPENDENT_AMBULATORY_CARE_PROVIDER_SITE_OTHER): Payer: Self-pay

## 2016-09-08 DIAGNOSIS — D6861 Antiphospholipid syndrome: Secondary | ICD-10-CM

## 2016-09-08 DIAGNOSIS — Z7901 Long term (current) use of anticoagulants: Secondary | ICD-10-CM | POA: Diagnosis not present

## 2016-09-08 DIAGNOSIS — R760 Raised antibody titer: Secondary | ICD-10-CM

## 2016-09-08 LAB — POCT INR: INR: 2.9

## 2016-09-08 NOTE — Progress Notes (Signed)
Reviewed Thanks DrG 

## 2016-09-08 NOTE — Patient Instructions (Signed)
Patient educated about medication as defined in this encounter and verbalized understanding by repeating back instructions provided.   

## 2016-09-08 NOTE — Progress Notes (Signed)
Anticoagulation Management [name] is a 71 y.o. female who reports to the clinic for monitoring of warfarin treatment.    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] Lupus anticoagulant positive [R76.0]       Comments:         Anticoagulation Care Providers    Provider Role Specialty Phone number   Annia Belt, MD Referring Oncology [phone]     ASSESSMENT Recent Results: The most recent result is correlated with 32.5 mg per week: Lab Results  Component Value Date   INR 2.9 09/08/2016   INR 4.1 08/31/2016   INR 2.0 08/02/2016   PROTIME 30.0 (H) 03/13/2015   Anticoagulation Dosing: INR as of 09/08/2016 and Previous Dosing Information    INR Dt INR Goal [employer] Sun Mon Tue Wed Thu Fri Sat   09/08/2016 2.9 2.0-3.0 32.5 mg 5 mg 5 mg 2.5 mg 5 mg 5 mg 5 mg 5 mg    Anticoagulation Dose Instructions as of 09/08/2016      Total Sun Mon Tue Wed Thu Fri Sat   New Dose 32.5 mg 5 mg 5 mg 5 mg 2.5 mg 5 mg 5 mg 5 mg     (5 mg x 1)  (5 mg x 1)  (5 mg x 1)  (5 mg x 0.5)  (5 mg x 1)  (5 mg x 1)  (5 mg x 1)                           INR today: Therapeutic  PLAN Weekly dose was unchanged  Patient Instructions  Patient educated about medication as defined in this encounter and verbalized understanding by repeating back instructions provided.   Patient advised to contact clinic or seek  medical attention if signs/symptoms of bleeding or thromboembolism occur.  Patient verbalized understanding by repeating back information and was advised to contact me if further medication-related questions arise. Patient was also provided an information handout.  Follow-up Return in about 2 weeks (around 09/22/2016).

## 2016-09-22 ENCOUNTER — Ambulatory Visit (INDEPENDENT_AMBULATORY_CARE_PROVIDER_SITE_OTHER): Payer: Medicare HMO | Admitting: Pharmacist

## 2016-09-22 DIAGNOSIS — D6861 Antiphospholipid syndrome: Secondary | ICD-10-CM | POA: Diagnosis not present

## 2016-09-22 DIAGNOSIS — R760 Raised antibody titer: Secondary | ICD-10-CM | POA: Diagnosis not present

## 2016-09-22 DIAGNOSIS — Z7901 Long term (current) use of anticoagulants: Secondary | ICD-10-CM

## 2016-09-22 LAB — POCT INR: INR: 2.1

## 2016-09-22 NOTE — Progress Notes (Signed)
Reviewed Thanks DrG 

## 2016-09-22 NOTE — Progress Notes (Signed)
Anticoagulation Management [name] is a 71 y.o. female who reports to the clinic for monitoring of warfarin treatment.    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] Lupus anticoagulant positive [R76.0]       Comments:         Anticoagulation Care Providers    Provider Role Specialty Phone number   Annia Belt, MD Referring Oncology [phone]     ASSESSMENT Recent Results: The most recent result is correlated with 32.5 mg per week: Lab Results  Component Value Date   INR 2.1 09/22/2016   INR 2.9 09/08/2016   INR 4.1 08/31/2016   PROTIME 30.0 (H) 03/13/2015   Anticoagulation Dosing: INR as of 09/22/2016 and Previous Dosing Information    INR Dt INR Goal [employer] Sun Mon Tue Wed Thu Fri Sat   09/22/2016 2.1 2.0-3.0 32.5 mg 5 mg 5 mg 5 mg 2.5 mg 5 mg 5 mg 5 mg    Anticoagulation Dose Instructions as of 09/22/2016      Total Sun Mon Tue Wed Thu Fri Sat   New Dose 32.5 mg 5 mg 5 mg 5 mg 2.5 mg 5 mg 5 mg 5 mg     (5 mg x 1)  (5 mg x 1)  (5 mg x 1)  (5 mg x 0.5)  (5 mg x 1)  (5 mg x 1)  (5 mg x 1)                           INR today: Therapeutic  PLAN Weekly dose was unchanged  Patient Instructions  Patient educated about medication as defined in this encounter and verbalized understanding by repeating back instructions provided.    Patient advised to contact clinic or  seek medical attention if signs/symptoms of bleeding or thromboembolism occur.  Patient verbalized understanding by repeating back information and was advised to contact me if further medication-related questions arise. Patient was also provided an information handout.  Follow-up 4 weeks  [name]

## 2016-09-22 NOTE — Patient Instructions (Signed)
Patient educated about medication as defined in this encounter and verbalized understanding by repeating back instructions provided.   

## 2016-09-27 ENCOUNTER — Ambulatory Visit (INDEPENDENT_AMBULATORY_CARE_PROVIDER_SITE_OTHER): Payer: Medicare HMO | Admitting: Orthopedic Surgery

## 2016-09-27 ENCOUNTER — Encounter (INDEPENDENT_AMBULATORY_CARE_PROVIDER_SITE_OTHER): Payer: Self-pay | Admitting: Orthopedic Surgery

## 2016-09-27 ENCOUNTER — Ambulatory Visit (INDEPENDENT_AMBULATORY_CARE_PROVIDER_SITE_OTHER): Payer: Medicare HMO

## 2016-09-27 ENCOUNTER — Ambulatory Visit (INDEPENDENT_AMBULATORY_CARE_PROVIDER_SITE_OTHER): Payer: Self-pay | Admitting: Orthopedic Surgery

## 2016-09-27 VITALS — Ht 65.0 in | Wt 186.0 lb

## 2016-09-27 DIAGNOSIS — M25512 Pain in left shoulder: Secondary | ICD-10-CM | POA: Diagnosis not present

## 2016-09-27 DIAGNOSIS — G8929 Other chronic pain: Secondary | ICD-10-CM

## 2016-09-27 DIAGNOSIS — M1731 Unilateral post-traumatic osteoarthritis, right knee: Secondary | ICD-10-CM

## 2016-09-27 DIAGNOSIS — M7542 Impingement syndrome of left shoulder: Secondary | ICD-10-CM

## 2016-09-27 MED ORDER — LIDOCAINE HCL 1 % IJ SOLN
5.0000 mL | INTRAMUSCULAR | Status: AC | PRN
  Administered 2016-09-27: 5 mL

## 2016-09-27 MED ORDER — METHYLPREDNISOLONE ACETATE 40 MG/ML IJ SUSP
40.0000 mg | INTRAMUSCULAR | Status: AC | PRN
  Administered 2016-09-27: 40 mg via INTRA_ARTICULAR

## 2016-09-27 NOTE — Progress Notes (Signed)
Office Visit Note   Patient: [name]           Date of Birth: [birth year]           MRN: [id number] Visit Date: 09/27/2016              Requested by: [name], PA-C [place], [place] [id number] PCP: [name]  Chief Complaint  Patient presents with  . Right Knee - Pain  . Left Shoulder - Pain    HPI: Pt is here today for right knee pain. Global pain, pain with weight bearing. Has had previous injections before and she would like a repeat injection today. Also the pt states that she is having left shoulder pain. She states that she had lifted something heavy a few months ago and that now she is unable to reach above her head or behind her back without pain and would like an injection in her shoulder as well. [name], RMA   Patient is a 71 year old woman seen today for evaluation of right knee pain, chronic and left shoulder pain which is new.  Knee pain, swells off and on. Has history of patella fracture and arthritis. Pain with descending stairs. Has good relief in past with steroid injections.  Shoulder pain ongoing for several months. 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 Has pain now lifting the arm above her head or reaching behind back is able to do so though. Dull aching pain, constant, worse at night. Lifted something heavy recently and felt things were worse since.  Assessment & Plan: Visit Diagnoses:  1. Impingement syndrome of left shoulder   2. Post-traumatic osteoarthritis of right knee   3. Chronic left shoulder pain     Plan: Injection to left shoulder and right knee today. Will follow up in office in 4 weeks if continued shoulder pain.   Follow-Up Instructions: Return in about 4 weeks (around 10/25/2016), or if symptoms worsen or fail to improve.   Right Knee Exam   Tenderness  The patient is experiencing no tenderness.     Range of Motion  The patient has normal right knee ROM.  Tests    Varus: negative Valgus: negative  Other  Erythema: absent Swelling: none   Left Shoulder Exam   Tenderness  The patient is experiencing tenderness in the biceps tendon.  Range of Motion  Active Abduction: 100   Tests  Drop Arm: negative Impingement: positive  Other  Pulse: present       Imaging: No results found.  Labs: Lab Results  Component Value Date   HGBA1C 6.0 (H) 04/14/2016   ESRSEDRATE 11 04/18/2016   ESRSEDRATE 11 11/04/2009   ESRSEDRATE 11 11/24/2008   CRP 1.3 (H) 04/18/2016   REPTSTATUS 04/20/2016 FINAL 04/16/2016   GRAMSTAIN  04/16/2016    CYTOSPIN SMEAR WBC PRESENT, PREDOMINANTLY MONONUCLEAR NO ORGANISMS SEEN    CULT NO GROWTH 3 DAYS 04/16/2016    Orders:  Orders Placed This Encounter  Procedures  . Large Joint Injection/Arthrocentesis  . Large Joint Injection/Arthrocentesis  . XR Shoulder Left   No orders of the defined types were placed in this encounter.    Procedures: Large Joint Inj Date/Time: 09/27/2016 2:34 PM Performed by: Suzan Slick Authorized by: Dondra Prader R   Consent Given by:  Patient Site marked: the procedure site was marked   Timeout: prior to procedure the correct patient, procedure, and site was verified  Indications:  Pain and diagnostic evaluation Location:  Shoulder Site:  L subacromial bursa Prep: patient was prepped and draped in usual sterile fashion   Needle Size:  22 G Needle Length:  1.5 inches Ultrasound Guidance: No   Fluoroscopic Guidance: No   Arthrogram: No   Medications:  5 mL lidocaine 1 %; 40 mg methylPREDNISolone acetate 40 MG/ML Aspiration Attempted: No   Patient tolerance:  Patient tolerated the procedure well with no immediate complications Large Joint Inj Date/Time: 09/27/2016 2:42 PM Performed by: Suzan Slick Authorized by: Dondra Prader R   Consent Given by:  Patient Site marked: the procedure site was marked   Timeout: prior to procedure the correct patient, procedure,  and site was verified   Indications:  Pain and diagnostic evaluation Location:  Knee Site:  R knee Needle Size:  22 G Needle Length:  1.5 inches Ultrasound Guidance: No   Fluoroscopic Guidance: No   Arthrogram: No   Medications:  5 mL lidocaine 1 %; 40 mg methylPREDNISolone acetate 40 MG/ML Aspiration Attempted: No   Patient tolerance:  Patient tolerated the procedure well with no immediate complications    Clinical Data: No additional findings.  Subjective: Review of Systems  Constitutional: Negative for chills and fever.  Musculoskeletal: Positive for arthralgias. Negative for joint swelling.    Objective: Vital Signs: Ht 5\' 5"  (1.651 m)   Wt 186 lb (84.4 kg)   BMI 30.95 kg/m   Specialty Comments:  No specialty comments available.  PMFS History: Patient Active Problem List   Diagnosis Date Noted  . Left leg pain   . Abnormal MRI of head   . Vision changes 04/13/2016  . Chronic anticoagulation 08/04/2015  . Pain in limb 12/26/2013  . Chest pain 11/27/2013  . GERD - post failed open Nissen 11/23/2012  . Dysphagia, unspecified(787.20) 09/28/2012  . S/P Open Nissen 1998 Dr. Mendel Ryder 08/18/2012  . 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 Antiphospholipid antibody syndrome ([name]) 06/01/2011  . Lupus anticoagulant positive 06/01/2011  . Chest pain   . Hx-TIA (transient ischemic attack)   . Hypothyroidism   . Breast cancer ([name])   . History of DVT (deep vein thrombosis)   . History of antiphospholipid antibody syndrome    Past Medical History:  Diagnosis Date  . Anxiety    severe panic attacks  . Arthritis   . Aseptic meningitis   . Asthma    as child  . Breast cancer ([name])    right  . Chest pain   . Chronic anticoagulation 08/04/2015  . Collapsed lung    hx of, left  . Complication of anesthesia    "hard to put asleep"  . Depression   . Fibromyalgia   . GERD (gastroesophageal reflux disease)   . H/O hiatal hernia   . Headache(784.0)   .  Hepatitis 1990   "from eating at restaurant"  . History of antiphospholipid antibody syndrome   . 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  . Hx-TIA (transient ischemic attack)   . Hyperlipidemia   . Hypertension   . Hypothyroidism   . Neuromuscular disorder ([name])   . Pneumonia    hx of  . Stroke ([name])   . 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 Alzheimer's disease Mother   . Heart disease Mother   .  Hyperlipidemia Mother   . Hypertension Mother   . Cancer Maternal Grandmother     colon  . Heart disease Sister   . Hypertension Sister   . Heart attack Sister   . Heart disease Brother   . Heart attack Brother   . Cancer Daughter   . Hypertension Daughter   . Hypertension Son     Past Surgical History:  Procedure Laterality Date  . ABDOMINAL HYSTERECTOMY    . BREAST SURGERY Right    x3  . BUNIONECTOMY Bilateral 30 years ago  . CARDIAC CATHETERIZATION  07/12/1997   NORMAL LEFT VENTRICULAR FUNCTION WITH EF AT LEAST 70-75%  . ESOPHAGEAL MANOMETRY N/A 11/06/2012   Procedure: ESOPHAGEAL MANOMETRY (EM);  Surgeon: Pedro Earls, MD;  Location: [facility];  Service: General;  Laterality: N/A;  . ESOPHAGOGASTRODUODENOSCOPY ENDOSCOPY    . KNEE ARTHROSCOPY Right   . LAPAROSCOPIC NISSEN FUNDOPLICATION N/A [id number]   Procedure: LAPAROSCOPIC TAKEDOWN  PERI-HIATAL HERNIA    REPAIR;  Surgeon: Pedro Earls, MD;  Location: [facility];  Service: General;  Laterality: N/A;  . [place]  . RIGHT OOPHORECTOMY  1980  . THYROIDECTOMY  in 20's  . TONSILLECTOMY  71 years old  . UPPER GI ENDOSCOPY N/A 12/12/2012   Procedure: UPPER GI ENDOSCOPY;  Surgeon: Pedro Earls, MD;  Location: [facility];  Service: General;  Laterality: N/A;   Social History   Occupational History  . Not on file.   Social History Main Topics    . Smoking status: Never Smoker  . Smokeless tobacco: Never Used  . Alcohol use No  . Drug use: No  . Sexual activity: Not on file

## 2016-10-06 ENCOUNTER — Telehealth: Payer: Self-pay | Admitting: *Deleted

## 2016-10-06 ENCOUNTER — Encounter: Payer: Self-pay | Admitting: Cardiology

## 2016-10-06 NOTE — Progress Notes (Signed)
Cardiology Office Note   Date:  10/10/2016   ID:  Greer, Wainright 1945-10-19, MRN [id number]  PCP:  [name]  Cardiologist:   Minus Breeding, MD    Chief Complaint  Patient presents with  . Muscle Pain      History of Present Illness: [name] is a 71 y.o. female who presents for evaluation of cramping.  I last saw her in May 2015.  She has a history of chest pain with a normal heart catheterization in 1998 and a normal stress Myoview in March of 2012.   She presents with a meal. Of complaints including chest and leg cramping. This seems to happen starting a couple of days ago. She says it's severe. It happens worse and she's lying down. She was given some muscle relaxants but just started these and hasn't had improvement. She says she's not slept in about 4 nights. She cannot bring these on with activity. They seem to happen at rest. She does have shortness of breath with activity which is mild. She denies any health dictations, presyncope or syncope. She does have no PND or orthopnea.  Past Medical History:  Diagnosis Date  . Anxiety    severe panic attacks  . Arthritis   . Aseptic meningitis   . Asthma    as child  . Breast cancer ([place])    right  . Chronic anticoagulation 08/04/2015  . Collapsed lung    hx of, left  . Complication of anesthesia    "hard to put asleep"  . Depression   . Fibromyalgia   . GERD (gastroesophageal reflux disease)   . H/O hiatal hernia   . Headache(784.0)   . Hepatitis 1990   "from eating at restaurant"  . History of antiphospholipid antibody syndrome   . 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  . Hx-TIA (transient ischemic attack)   . Hyperlipidemia   . Hypertension   . Hypothyroidism   . Neuromuscular disorder ([place])   . Pneumonia    hx of  . Stroke ([place])   . 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 ABDOMINAL HYSTERECTOMY    . BREAST SURGERY Right    x3  . BUNIONECTOMY Bilateral 30 years ago  . CARDIAC CATHETERIZATION  07/12/1997   NORMAL LEFT VENTRICULAR FUNCTION WITH EF AT LEAST 70-75%  . ESOPHAGEAL MANOMETRY N/A 11/06/2012   Procedure: ESOPHAGEAL MANOMETRY (EM);  Surgeon: Pedro Earls, MD;  Location: [facility];  Service: General;  Laterality: N/A;  . ESOPHAGOGASTRODUODENOSCOPY ENDOSCOPY    . KNEE ARTHROSCOPY Right   . LAPAROSCOPIC NISSEN FUNDOPLICATION N/A [id number]/6767   Procedure: LAPAROSCOPIC TAKEDOWN  PERI-HIATAL HERNIA    REPAIR;  Surgeon: Pedro Earls, MD;  Location: [facility];  Service: General;  Laterality: N/A;  . [place]  . RIGHT OOPHORECTOMY  1980  . THYROIDECTOMY  in 20's  . TONSILLECTOMY  71 years old  . UPPER GI ENDOSCOPY N/A 12/12/2012   Procedure: UPPER GI ENDOSCOPY;  Surgeon: Pedro Earls, MD;  Location: [facility];  Service: General;  Laterality: N/A;     Current Outpatient Prescriptions  Medication Sig Dispense Refill  . ALPRAZolam (XANAX) 0.5 MG tablet Take 0.5 mg by mouth 2 (two) times daily as needed for sleep. Every evening and sometimes  during the day if needed    . aspirin 81 MG tablet Take 81 mg by mouth daily at 6 PM.     . Calcium Carbonate-Vitamin D (CALCIUM + D PO) Take 600 mg by mouth daily.     . fluticasone (FLONASE) 50 MCG/ACT nasal spray Place 2 sprays into the nose daily as needed for allergies.     [name] HYDROcodone-acetaminophen (NORCO/VICODIN) 5-325 MG per tablet Take 1 tablet by mouth at bedtime.     [name] levothyroxine (SYNTHROID, LEVOTHROID) 125 MCG tablet daily.    . Magnesium 400 MG TABS Take 400 mg by mouth daily.    . metoprolol succinate (TOPROL-XL) 25 MG 24 hr tablet Take 12.5 mg by mouth 2 (two) times daily.     [name] omeprazole (PRILOSEC) 20 MG capsule Take 20 mg by mouth daily.     . pravastatin (PRAVACHOL) 20 MG tablet Take 20 mg by mouth daily.    [name]  tiZANidine (ZANAFLEX) 4 MG tablet Take 4 mg by mouth every 6 (six) hours as needed. For 10 days    . warfarin (COUMADIN) 5 MG tablet Take 1 tablet by mouth daily EXCEPT Mondays take 1 & 1/2 tablets by mouth daily 32 tablet 3   No current facility-administered medications for this visit.     Allergies:   Bee venom; Hornet venom; Reglan [metoclopramide]; Vitamin k; Benadryl [diphenhydramine hcl]; Crestor [rosuvastatin calcium]; Doxycycline; Levofloxacin; Zocor [simvastatin]; Ciprofloxacin; Daypro [oxaprozin]; Phytonadione; and Sulfonamide derivatives    ROS:  Please see the history of present illness.   Otherwise, review of systems are positive for none.   All other systems are reviewed and negative.    PHYSICAL EXAM: VS:  BP (!) 140/96 (BP Location: Left Arm, Patient Position: Sitting, Cuff Size: Normal)   Pulse 69   Ht 5\' 5"  (1.651 m)   Wt 177 lb (80.3 kg)   BMI 29.45 kg/m  , BMI Body mass index is 29.45 kg/m. GENERAL:  Well appearing HEENT:  Pupils equal round and reactive, fundi not visualized, oral mucosa unremarkable NECK:  No jugular venous distention, waveform within normal limits, carotid upstroke brisk and symmetric, questionable carotid bruits, no thyromegaly LYMPHATICS:  No cervical, inguinal adenopathy LUNGS:  Clear to auscultation bilaterally BACK:  No CVA tenderness CHEST:  Unremarkable HEART:  PMI not displaced or sustained,S1 and S2 within normal limits, no S3, no S4, no clicks, no rubs, no murmurs ABD:  Flat, positive bowel sounds normal in frequency in pitch, questionable abdominal bruits, no rebound, no guarding, no midline pulsatile mass, no hepatomegaly, no splenomegaly EXT:  2 plus pulses throughout, no edema, no cyanosis no clubbing SKIN:  No rashes no nodules NEURO:  Cranial nerves II through XII grossly intact, motor grossly intact throughout PSYCH:  Cognitively intact, oriented to person place and time    EKG:  EKG is ordered today. The ekg ordered today  demonstrates sinus rhythm, rate 69, axis within normal limits, intervals within normal limits, no acute ST-T wave changes.   Recent Labs: 04/15/2016: TSH 45.129 08/02/2016: ALT 17; BUN 18; Creatinine, Ser 1.53; Hemoglobin 12.7; Platelets 252; Potassium 4.7; Sodium 139 10/07/2016: Magnesium 2.2    Lipid Panel    Component Value Date/Time   CHOL 204 (H) 04/14/2016 0525   TRIG 231 (H) 04/14/2016 0525   HDL 40 (L) 04/14/2016 0525   CHOLHDL 5.1 04/14/2016 0525   VLDL 46 (H) 04/14/2016 0525   LDLCALC 118 (H) 04/14/2016 0525      Wt Readings from Last  3 Encounters:  10/07/16 177 lb (80.3 kg)  09/27/16 186 lb (84.4 kg)  08/10/16 186 lb 1.6 oz (84.4 kg)      Other studies Reviewed: Additional studies/ records that were reviewed today include: Labs. Review of the above records demonstrates:  Please see elsewhere in the note.     ASSESSMENT AND PLAN:  CRAMPING:  I was able to review labs. There was no magnesium so I'll have this drawn. We talked about over-the-counter remedies for cramping and I have suggested a couple of these.  BRUITS:  I will check a carotid and abdominal ultrasound.     Current medicines are reviewed at length with the patient today.  The patient does not have concerns regarding medicines.  The following changes have been made:  no change  Labs/ tests ordered today include:   Orders Placed This Encounter  Procedures  . Magnesium  . EKG 12-Lead     Disposition:   FU with me as needed.     Signed, Minus Breeding, MD  10/10/2016 2:39 PM    [facility]

## 2016-10-06 NOTE — Telephone Encounter (Signed)
Pt calling helen back

## 2016-10-06 NOTE — Telephone Encounter (Signed)
Pt calls and states that dr Deatra Ina prescribed tizanidine 4mg  to help her rest, it is not working and she has called dr Kelby Fam office and is waiting on a return call. She was also given "a steroid shot" at the visit, she states she is to let dr Beryle Beams know when she starts new meds also he may need to call dr Deatra Ina and advise her on what may help pt rest?

## 2016-10-07 ENCOUNTER — Ambulatory Visit (INDEPENDENT_AMBULATORY_CARE_PROVIDER_SITE_OTHER): Payer: Medicare HMO | Admitting: Cardiology

## 2016-10-07 ENCOUNTER — Encounter: Payer: Self-pay | Admitting: Cardiology

## 2016-10-07 VITALS — BP 140/96 | HR 69 | Ht 65.0 in | Wt 177.0 lb

## 2016-10-07 DIAGNOSIS — R079 Chest pain, unspecified: Secondary | ICD-10-CM

## 2016-10-07 DIAGNOSIS — R0989 Other specified symptoms and signs involving the circulatory and respiratory systems: Secondary | ICD-10-CM

## 2016-10-07 DIAGNOSIS — R252 Cramp and spasm: Secondary | ICD-10-CM | POA: Diagnosis not present

## 2016-10-07 NOTE — Telephone Encounter (Signed)
Contacted patient---she requested appointment next week. Appointment scheduled

## 2016-10-07 NOTE — Patient Instructions (Addendum)
Medication Instructions:  Continue current medications  START Hotshot for muscle cramps--OTC medication  Drink Tonic Water  Labwork: Your physician recommends that you return for lab work: Magnesium   Testing/Procedures: Your physician has requested that you have a carotid duplex. This test is an ultrasound of the carotid arteries in your neck. It looks at blood flow through these arteries that supply the brain with blood. Allow one hour for this exam. There are no restrictions or special instructions.  Your physician has requested that you have an abdominal aorta duplex. During this test, an ultrasound is used to evaluate the aorta. Allow 30 minutes for this exam. Do not eat after midnight the day before and avoid carbonated beverages   Follow-Up: Your physician wants you to follow-up in: 1 Year. You will receive a reminder letter in the mail two months in advance. If you don't receive a letter, please call our office to schedule the follow-up appointment.   Any Other Special Instructions Will Be Listed Below (If Applicable).   If you need a refill on your cardiac medications before your next appointment, please call your pharmacy.

## 2016-10-08 ENCOUNTER — Ambulatory Visit (INDEPENDENT_AMBULATORY_CARE_PROVIDER_SITE_OTHER): Payer: Medicare HMO | Admitting: Pharmacist

## 2016-10-08 DIAGNOSIS — Z7901 Long term (current) use of anticoagulants: Secondary | ICD-10-CM | POA: Diagnosis not present

## 2016-10-08 DIAGNOSIS — D6861 Antiphospholipid syndrome: Secondary | ICD-10-CM | POA: Diagnosis not present

## 2016-10-08 DIAGNOSIS — Z8673 Personal history of transient ischemic attack (TIA), and cerebral infarction without residual deficits: Secondary | ICD-10-CM

## 2016-10-08 DIAGNOSIS — Z86718 Personal history of other venous thrombosis and embolism: Secondary | ICD-10-CM

## 2016-10-08 DIAGNOSIS — R760 Raised antibody titer: Secondary | ICD-10-CM

## 2016-10-08 LAB — POCT INR: INR: 1.3

## 2016-10-08 NOTE — Progress Notes (Signed)
Anticoagulation Management [name] is a 71 y.o. female who reports to the clinic for monitoring of warfarin treatment.    Indication: antiphospholipid antibody, lupus anicoag positive, history of DVT, upper extremity thrombosis, TIA Duration: indefinite Supervising physician: Murriel Hopper  Anticoagulation Clinic Visit History: Patient does not report signs/symptoms of bleeding or thromboembolism  Anticoagulation Episode Summary    Current INR goal:   2.0-3.0  TTR:   68.5 % (5.3 y)  Next INR check:   10/11/2016  INR from last check:   1.3! (10/08/2016)  Weekly max dose:     Target end date:     INR check location:     Preferred lab:     Send INR reminders to:   [facility]   Indications   Antiphospholipid antibody syndrome ([place]) [D68.61] Lupus anticoagulant positive [R76.0]       Comments:         Anticoagulation Care Providers    Provider Role Specialty Phone number   Annia Belt, MD Referring Oncology [phone]     ASSESSMENT Recent Results: Lab Results  Component Value Date   INR 1.3 10/08/2016   INR 2.1 09/22/2016   INR 2.9 09/08/2016   PROTIME 30.0 (H) 03/13/2015   Anticoagulation Dosing: INR as of 10/08/2016 and Previous Dosing Information    INR Dt INR Goal [employer] Sun Mon Tue Wed Thu Fri Sat   10/08/2016 1.3 2.0-3.0 32.5 mg 5 mg 5 mg 5 mg 2.5 mg 5 mg 5 mg 5 mg    Anticoagulation Dose Instructions as of 10/08/2016      Total Sun Mon Tue Wed Thu Fri Sat   New Dose 37.5 mg 7.5 mg 5 mg 5 mg 2.5 mg 5 mg 7.5 mg 5 mg     (5 mg x 1.5)  (5 mg x 1)  (5 mg x 1)  (5 mg x 0.5)  (5 mg x 1)  (5 mg x 1.5)  (5 mg x 1)                           INR today: Subtherapeutic, patient reports no recent changes other than starting fluticasone nasal spray and getting a steroid injection  PLAN Weekly dose was increased by 15% to 37.5 mg per week. Patient has enoxaparin 120 mg at home, instructed patient to initiate enoxaparin 120 mg daily  until appointment on Monday  There are no Patient Instructions on file for this visit. Patient advised to contact clinic or seek medical attention if signs/symptoms of bleeding or thromboembolism occur.  Patient verbalized understanding by repeating back information and was advised to contact me if further medication-related questions arise. Patient was also provided an information handout.  Follow-up Return in about 3 days (around 10/11/2016).  [name]

## 2016-10-08 NOTE — Patient Instructions (Signed)
Patient educated about medication as defined in this encounter and verbalized understanding by repeating back instructions provided.   

## 2016-10-09 LAB — MAGNESIUM: Magnesium: 2.2 mg/dL (ref 1.5–2.5)

## 2016-10-10 ENCOUNTER — Encounter: Payer: Self-pay | Admitting: Cardiology

## 2016-10-11 ENCOUNTER — Ambulatory Visit (INDEPENDENT_AMBULATORY_CARE_PROVIDER_SITE_OTHER): Payer: Medicare HMO | Admitting: Pharmacist

## 2016-10-11 ENCOUNTER — Telehealth: Payer: Self-pay | Admitting: Cardiology

## 2016-10-11 DIAGNOSIS — Z7901 Long term (current) use of anticoagulants: Secondary | ICD-10-CM | POA: Diagnosis not present

## 2016-10-11 DIAGNOSIS — D6861 Antiphospholipid syndrome: Secondary | ICD-10-CM

## 2016-10-11 DIAGNOSIS — R760 Raised antibody titer: Secondary | ICD-10-CM | POA: Diagnosis not present

## 2016-10-11 LAB — POCT INR: INR: 2.1

## 2016-10-11 NOTE — Patient Instructions (Signed)
Patient instructed to take medications as defined in the Anti-coagulation Track section of this encounter.  Patient instructed to take today's dose.  Patient instructed to take 1 of your 5mg  peach-colored warfarin tablets by mouth once-daily at [facility] each day.  Patient verbalized understanding of these instructions.

## 2016-10-11 NOTE — Progress Notes (Signed)
Anti-Coagulation Progress Note  [name] is a 71 y.o. female who is currently on an anti-coagulation regimen.    RECENT RESULTS: Recent results are below, the most recent result is correlated with a dose of 37.5 mg. per week (including a one-time dose recommendation this past Friday when INR was found to be low + LMWH bridge therapy that lasted Friday, Saturday, Sunday. One syringe remains--to be administered today.  Lab Results  Component Value Date   INR 2.10 10/11/2016   INR 1.3 10/08/2016   INR 2.1 09/22/2016   PROTIME 30.0 (H) 03/13/2015    ANTI-COAG DOSE: Anticoagulation Dose Instructions as of 10/11/2016      [name] Tue Wed Thu Fri Sat   New Dose 5 mg 5 mg 5 mg 5 mg 5 mg 5 mg 5 mg       ANTICOAG SUMMARY: Anticoagulation Episode Summary    Current INR goal:   2.0-3.0  TTR:   68.4 % (5.3 y)  Next INR check:   10/25/2016  INR from last check:   2.10 (10/11/2016)  Weekly max dose:     Target end date:     INR check location:     Preferred lab:     Send INR reminders to:   [facility]   Indications   Antiphospholipid antibody syndrome ([place]) [D68.61] Lupus anticoagulant positive [R76.0]       Comments:         Anticoagulation Care Providers    Provider Role Specialty Phone number   Annia Belt, MD Referring Oncology [phone]      ANTICOAG TODAY: Anticoagulation Summary  As of 10/11/2016   INR goal:   2.0-3.0  TTR:     Today's INR:   2.10  Next INR check:   10/25/2016  Target end date:      Indications   Antiphospholipid antibody syndrome ([place]) [D68.61] Lupus anticoagulant positive [R76.0]        Anticoagulation Episode Summary    INR check location:      Preferred lab:      Send INR reminders to:   [facility]   Comments:       Anticoagulation Care Providers    Provider Role Specialty Phone number   Annia Belt, MD Referring Oncology [phone]      PATIENT INSTRUCTIONS: Patient Instructions  Patient  instructed to take medications as defined in the Anti-coagulation Track section of this encounter.  Patient instructed to take today's dose.  Patient instructed to take 1 of your 5mg  peach-colored warfarin tablets by mouth once-daily at [facility] each day.  Patient verbalized understanding of these instructions.       FOLLOW-UP Return in 2 weeks (on 10/25/2016) for Follow up INR at 1100h.  [name], III Pharm.D., CACP

## 2016-10-11 NOTE — Telephone Encounter (Signed)
PT AWARE OF MAG  LEVEL ./CY   2.2

## 2016-10-11 NOTE — Telephone Encounter (Signed)
Patient is returning your call, please call.  Thanks

## 2016-10-13 ENCOUNTER — Ambulatory Visit: Payer: Medicare HMO | Admitting: Pharmacist

## 2016-10-13 NOTE — Progress Notes (Signed)
Reviewed Thanks Dr Darnell Level See if you can convince her to switch to Xarelto or Eliquis

## 2016-10-14 ENCOUNTER — Other Ambulatory Visit: Payer: Self-pay

## 2016-10-14 DIAGNOSIS — D6861 Antiphospholipid syndrome: Secondary | ICD-10-CM

## 2016-10-14 MED ORDER — WARFARIN SODIUM 5 MG PO TABS: ORAL_TABLET | ORAL | 1 refills | Status: DC

## 2016-10-18 ENCOUNTER — Ambulatory Visit: Payer: Medicare HMO | Admitting: Pharmacist

## 2016-10-25 ENCOUNTER — Ambulatory Visit (INDEPENDENT_AMBULATORY_CARE_PROVIDER_SITE_OTHER): Payer: Medicare HMO | Admitting: Pharmacist

## 2016-10-25 ENCOUNTER — Telehealth: Payer: Self-pay | Admitting: *Deleted

## 2016-10-25 ENCOUNTER — Encounter: Payer: Self-pay | Admitting: *Deleted

## 2016-10-25 DIAGNOSIS — R760 Raised antibody titer: Secondary | ICD-10-CM | POA: Diagnosis not present

## 2016-10-25 DIAGNOSIS — M7989 Other specified soft tissue disorders: Secondary | ICD-10-CM

## 2016-10-25 DIAGNOSIS — Z86718 Personal history of other venous thrombosis and embolism: Secondary | ICD-10-CM

## 2016-10-25 DIAGNOSIS — Z7901 Long term (current) use of anticoagulants: Secondary | ICD-10-CM

## 2016-10-25 DIAGNOSIS — D6861 Antiphospholipid syndrome: Secondary | ICD-10-CM | POA: Diagnosis not present

## 2016-10-25 DIAGNOSIS — M79605 Pain in left leg: Secondary | ICD-10-CM

## 2016-10-25 DIAGNOSIS — M79604 Pain in right leg: Secondary | ICD-10-CM

## 2016-10-25 LAB — POCT INR: INR: 3.8

## 2016-10-25 NOTE — Telephone Encounter (Signed)
OK to set up for LE venous doppler although DVT is probably unlikely with INR 3.8.

## 2016-10-25 NOTE — Telephone Encounter (Signed)
Received patient as a walk in requesting to get an order for LE dopplers due to left ankle swelling and pain.  Also requesting to get her abd and carotid US before the 18th.     Patient reports left ankle swelling and pain, no redness noted. Also reports continued LE "deep pain". States it is not cramps it is painful and last 10-15 minutes.  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  Reports she was at the cancer center getting INR checked this AM and they wanted to send her to the ER to get her ankle checked out and stated she needs her test done as soon as possible.  INR 3.8 today.  Last OV 3/15 with Dr. Percival Spanish.    No sooner appointments for Abd and Carotid at this time.  Scheduling to check on alternate locations.  Advised patient I would route to Dr. Percival Spanish for advice.  Otherwise if pain becomes worse and swelling increases, becomes red or hot to touch, proceed to ER for evaluation.    Routed to Dr. Percival Spanish as well as DOD for urgent recommendations as Dr. Percival Spanish OOO this week.

## 2016-10-25 NOTE — Progress Notes (Signed)
Anti-Coagulation Progress Note  [name] is a 71 y.o. female who is currently on an anti-coagulation regimen.    RECENT RESULTS: Recent results are below, the most recent result is correlated with a dose of 35 mg. per week: Lab Results  Component Value Date   INR 3.8 10/25/2016   INR 2.10 10/11/2016   INR 1.3 10/08/2016   PROTIME 30.0 (H) 03/13/2015    ANTI-COAG DOSE: Anticoagulation Dose Instructions as of 10/25/2016      Sun Mon Tue Wed Thu Fri Sat   New Dose 5 mg 2.5 mg 5 mg 5 mg 5 mg 5 mg 5 mg       ANTICOAG SUMMARY: Anticoagulation Episode Summary    Current INR goal:   2.0-3.0  TTR:   68.3 % (5.3 y)  Next INR check:   11/08/2016  INR from last check:   3.8! (10/25/2016)  Weekly max dose:     Target end date:     INR check location:     Preferred lab:     Send INR reminders to:   [facility]   Indications   Antiphospholipid antibody syndrome ([place]) [D68.61] Lupus anticoagulant positive [R76.0]       Comments:         Anticoagulation Care Providers    Provider Role Specialty Phone number   Annia Belt, MD Referring Oncology [phone]      ANTICOAG TODAY: Anticoagulation Summary  As of 10/25/2016   INR goal:   2.0-3.0  TTR:     Today's INR:   3.8!  Next INR check:   11/08/2016  Target end date:      Indications   Antiphospholipid antibody syndrome (HCC) [D68.61] Lupus anticoagulant positive [R76.0]        Anticoagulation Episode Summary    INR check location:      Preferred lab:      Send INR reminders to:   [facility]   Comments:       Anticoagulation Care Providers    Provider Role Specialty Phone number   Annia Belt, MD Referring Oncology [phone]      PATIENT INSTRUCTIONS: Patient Instructions  Patient instructed to take medications as defined in the Anti-coagulation Track section of this encounter.  Patient instructed to take today's dose.  Patient instructed to take 1/2 tablet on Monday; all  other days take 1 tablet. Patient verbalized understanding of these instructions.      FOLLOW-UP Return in 2 weeks (on 11/08/2016) for Follow up INR at 1130h.  Florinda Marker, Student Pharmacist  [name], III Pharm.D., CACP

## 2016-10-25 NOTE — Telephone Encounter (Signed)
Patient made aware. Order placed.  Will have scheduling reach out to patient to schedule.  Pt verbalized understanding and aware abdominal and carotid US unable to be completed prior to current appt.

## 2016-10-25 NOTE — Patient Instructions (Signed)
Patient instructed to take medications as defined in the Anti-coagulation Track section of this encounter.  Patient instructed to take today's dose.  Patient instructed to take 1/2 tablet on Monday; all other days take 1 tablet. Patient verbalized understanding of these instructions.

## 2016-10-27 ENCOUNTER — Ambulatory Visit (HOSPITAL_COMMUNITY)
Admission: RE | Admit: 2016-10-27 | Discharge: 2016-10-27 | Disposition: A | Discharge status: Home / Self Care | Payer: Medicare HMO | Source: Ambulatory Visit | Attending: Cardiology | Admitting: Cardiology

## 2016-10-27 DIAGNOSIS — M79604 Pain in right leg: Secondary | ICD-10-CM | POA: Insufficient documentation

## 2016-10-27 DIAGNOSIS — M7989 Other specified soft tissue disorders: Secondary | ICD-10-CM

## 2016-10-27 DIAGNOSIS — Z86718 Personal history of other venous thrombosis and embolism: Secondary | ICD-10-CM | POA: Insufficient documentation

## 2016-10-27 DIAGNOSIS — M79605 Pain in left leg: Secondary | ICD-10-CM | POA: Insufficient documentation

## 2016-10-29 NOTE — Progress Notes (Signed)
INTERNAL MEDICINE TEACHING ATTENDING ADDENDUM - [name] Groves, DO Duration- indefinate, Indication- antiphospholid ab syndrome, INR-  Lab Results  Component Value Date   INR 3.8 10/25/2016   PROTIME 30.0 (H) 03/13/2015  . Agree with pharmacy recommendations as outlined in their note.

## 2016-11-08 ENCOUNTER — Ambulatory Visit: Payer: Medicare HMO

## 2016-11-10 ENCOUNTER — Ambulatory Visit (HOSPITAL_COMMUNITY)
Admission: RE | Admit: 2016-11-10 | Discharge: 2016-11-10 | Disposition: A | Discharge status: Home / Self Care | Payer: Medicare HMO | Source: Ambulatory Visit | Attending: Cardiovascular Disease | Admitting: Cardiovascular Disease

## 2016-11-10 ENCOUNTER — Ambulatory Visit (INDEPENDENT_AMBULATORY_CARE_PROVIDER_SITE_OTHER): Payer: Medicare HMO | Admitting: Pharmacist

## 2016-11-10 ENCOUNTER — Ambulatory Visit: Payer: Medicare HMO | Admitting: Pharmacist

## 2016-11-10 DIAGNOSIS — Z7901 Long term (current) use of anticoagulants: Secondary | ICD-10-CM | POA: Diagnosis not present

## 2016-11-10 DIAGNOSIS — I6523 Occlusion and stenosis of bilateral carotid arteries: Secondary | ICD-10-CM

## 2016-11-10 DIAGNOSIS — R0989 Other specified symptoms and signs involving the circulatory and respiratory systems: Secondary | ICD-10-CM

## 2016-11-10 DIAGNOSIS — I700 Atherosclerosis of aorta: Secondary | ICD-10-CM | POA: Insufficient documentation

## 2016-11-10 DIAGNOSIS — I708 Atherosclerosis of other arteries: Secondary | ICD-10-CM | POA: Insufficient documentation

## 2016-11-10 DIAGNOSIS — D6861 Antiphospholipid syndrome: Secondary | ICD-10-CM | POA: Diagnosis not present

## 2016-11-10 DIAGNOSIS — R760 Raised antibody titer: Secondary | ICD-10-CM | POA: Diagnosis not present

## 2016-11-10 LAB — POCT INR: INR: 2.4

## 2016-11-10 MED ORDER — WARFARIN SODIUM 5 MG PO TABS: ORAL_TABLET | ORAL | 1 refills | Status: DC

## 2016-11-10 NOTE — Patient Instructions (Signed)
Patient educated about medication as defined in this encounter and verbalized understanding by repeating back instructions provided.   

## 2016-11-10 NOTE — Progress Notes (Signed)
Anticoagulation Management [name] is a 71 y.o. female who reports to the clinic for monitoring of warfarin treatment.    Indication: lupus anticoagulant positive, VTE history Duration: indefinite Supervising physician: Murriel Hopper  Anticoagulation Clinic Visit History: Patient does not report signs/symptoms of bleeding or thromboembolism   Anticoagulation Episode Summary    Current INR goal:   2.0-3.0  TTR:   68.1 % (5.4 y)  Next INR check:   12/06/2016  INR from last check:   2.4 (11/10/2016)  Weekly max dose:     Target end date:     INR check location:     Preferred lab:     Send INR reminders to:   [facility]   Indications   Antiphospholipid antibody syndrome ([place]) [D68.61] Lupus anticoagulant positive [R76.0]       Comments:         Anticoagulation Care Providers    Provider Role Specialty Phone number   Annia Belt, MD Referring Oncology [phone]     ASSESSMENT Recent Results: The most recent result is correlated with 32.5 mg per week: Lab Results  Component Value Date   INR 2.4 11/10/2016   INR 3.8 10/25/2016   INR 2.10 10/11/2016   PROTIME 30.0 (H) 03/13/2015   Anticoagulation Dosing: INR as of 11/10/2016 and Previous Dosing Information    INR Dt INR Goal [employer] Sun Mon Tue Wed Thu Fri Sat   11/10/2016 2.4 2.0-3.0 32.5 mg 5 mg 2.5 mg 5 mg 5 mg 5 mg 5 mg 5 mg    Anticoagulation Dose Instructions as of 11/10/2016      Total Sun Mon Tue Wed Thu Fri Sat   New Dose 32.5 mg 5 mg 2.5 mg 5 mg 5 mg 5 mg 5 mg 5 mg     (5 mg x 1)  (5 mg x 0.5)  (5 mg x 1)  (5 mg x 1)  (5 mg x 1)  (5 mg x 1)  (5 mg x 1)                           INR today: Therapeutic  PLAN Weekly dose was unchanged  Patient Instructions  Patient educated about medication as defined in this encounter and verbalized understanding by repeating back instructions provided.   Patient advised to contact clinic or seek medical attention if signs/symptoms  of bleeding or thromboembolism occur.  Patient verbalized understanding by repeating back information and was advised to contact me if further medication-related questions arise. Patient was also provided an information handout.  Follow-up Return in about 4 weeks (around 12/08/2016).  [name]

## 2016-11-11 ENCOUNTER — Telehealth: Payer: Self-pay | Admitting: *Deleted

## 2016-11-11 DIAGNOSIS — R0989 Other specified symptoms and signs involving the circulatory and respiratory systems: Secondary | ICD-10-CM

## 2016-11-11 NOTE — Telephone Encounter (Signed)
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 5-42% LICA stenosis. Follow up in two years.  Call Ms. Winegarden with the results and send results to [facility], PA-C

## 2016-11-11 NOTE — Telephone Encounter (Signed)
Order for carotid doppler placed

## 2016-11-11 NOTE — Progress Notes (Signed)
Reviewed Thanks DrG 

## 2016-11-25 ENCOUNTER — Telehealth: Payer: Self-pay | Admitting: *Deleted

## 2016-11-25 NOTE — Telephone Encounter (Signed)
-----   Message from Troy Sine, MD sent at 11/14/2016  9:10 PM EDT ----- No DVT

## 2016-11-25 NOTE — Telephone Encounter (Signed)
Left message to return a call to discuss results. 

## 2016-11-26 ENCOUNTER — Ambulatory Visit (INDEPENDENT_AMBULATORY_CARE_PROVIDER_SITE_OTHER): Payer: Medicare HMO | Admitting: Orthopedic Surgery

## 2016-11-26 ENCOUNTER — Encounter (INDEPENDENT_AMBULATORY_CARE_PROVIDER_SITE_OTHER): Payer: Self-pay | Admitting: Orthopedic Surgery

## 2016-11-26 ENCOUNTER — Ambulatory Visit (INDEPENDENT_AMBULATORY_CARE_PROVIDER_SITE_OTHER): Payer: Medicare HMO

## 2016-11-26 VITALS — Ht 65.0 in | Wt 177.0 lb

## 2016-11-26 DIAGNOSIS — M25561 Pain in right knee: Secondary | ICD-10-CM

## 2016-11-26 DIAGNOSIS — G8929 Other chronic pain: Secondary | ICD-10-CM | POA: Diagnosis not present

## 2016-11-26 DIAGNOSIS — M1712 Unilateral primary osteoarthritis, left knee: Secondary | ICD-10-CM | POA: Insufficient documentation

## 2016-11-26 DIAGNOSIS — M1711 Unilateral primary osteoarthritis, right knee: Secondary | ICD-10-CM | POA: Diagnosis not present

## 2016-11-26 MED ORDER — METHYLPREDNISOLONE ACETATE 40 MG/ML IJ SUSP
40.0000 mg | INTRAMUSCULAR | Status: AC | PRN
  Administered 2016-11-26: 40 mg via INTRA_ARTICULAR

## 2016-11-26 MED ORDER — LIDOCAINE HCL 1 % IJ SOLN
5.0000 mL | INTRAMUSCULAR | Status: AC | PRN
  Administered 2016-11-26: 5 mL

## 2016-11-26 NOTE — Progress Notes (Signed)
Office Visit Note   Patient: [name]           Date of Birth: [birth year]           MRN: [id number] Visit Date: 11/26/2016              Requested by: [name], PA-C [place], [place] [address] PCP: [name]  Chief Complaint  Patient presents with  . Right Knee - Pain    Osteoarthritis, s/p injury 4 days ago, hit on wooden chair. History of arthroscopy.   . Left Knee - Pain    Osteoarthritis      HPI: Patient is a 71 year old woman with osteoarthritis both knees she last had an injection about 3 years ago. She recently struck her right knee on the corner of a hard wooden table chair about 4 days ago patient is on Coumadin she was worried about a blood clot or any type of chip fracture. She complains of swelling feels like blood is underneath the skin. She also complains of popping in the left knee going. Pain going up and downstairs.  Assessment & Plan: Visit Diagnoses:  1. Chronic pain of right knee   2. Unilateral primary osteoarthritis, left knee   3. Unilateral primary osteoarthritis, right knee     Plan: Both knees were injected in the anterior medial portal she tolerated this well recommended that she get a pair of 15-20 mm compression stockings for her varicose veins in her use of Coumadin she is at risk of blood clots hematoma risk of ulceration.  Follow-Up Instructions: Return if symptoms worsen or fail to improve.   Ortho Exam  Patient is alert, oriented, no adenopathy, well-dressed, normal affect, normal respiratory effort. Examination patient has an antalgic gait. She has swelling on the lateral aspect of the right knee but there is no ecchymosis no bruising no skin breakdown. She is tender to palpation medial lateral joint line both knees" cruciate are stable bilaterally. She is crepitation in the patellofemoral joint with range of motion both knees.  Imaging: Xr Knee 1-2 Views Right  Result Date: 11/26/2016 AP  radiograph of both knees shows periarticular bony spurs medial and lateral joint line bilaterally subcondylar sclerosis worse on the medial joint line bilaterally there is maintenance of the joint space in the AP plane. Lateral radiograph of the right knee shows bony spurs in the patellofemoral joint no evidence of any loose bodies or chip fractures from her recent blunt trauma.   Labs: Lab Results  Component Value Date   HGBA1C 6.0 (H) 04/14/2016   ESRSEDRATE 11 04/18/2016   ESRSEDRATE 11 11/04/2009   ESRSEDRATE 11 11/24/2008   CRP 1.3 (H) 04/18/2016   REPTSTATUS 04/20/2016 FINAL 04/16/2016   GRAMSTAIN  04/16/2016    CYTOSPIN SMEAR WBC PRESENT, PREDOMINANTLY MONONUCLEAR NO ORGANISMS SEEN    CULT NO GROWTH 3 DAYS 04/16/2016    Orders:  Orders Placed This Encounter  Procedures  . Large Joint Injection/Arthrocentesis  . Large Joint Injection/Arthrocentesis  . XR Knee 1-2 Views Right   No orders of the defined types were placed in this encounter.    Procedures: Large Joint Inj Date/Time: 11/26/2016 8:37 AM Performed by: Denetra Formoso V Authorized by: Newt Minion   Consent Given by:  Patient Site marked: the procedure site was marked   Timeout: prior to procedure the correct patient, procedure, and site was verified   Indications:  Pain and diagnostic evaluation Location:  Knee Site:  R knee Prep: patient was prepped and draped in usual sterile fashion   Needle Size:  22 G Needle Length:  1.5 inches Approach:  Anteromedial Ultrasound Guidance: No   Fluoroscopic Guidance: No   Arthrogram: No   Medications:  5 mL lidocaine 1 %; 40 mg methylPREDNISolone acetate 40 MG/ML Aspiration Attempted: No   Patient tolerance:  Patient tolerated the procedure well with no immediate complications Large Joint Inj Date/Time: 11/26/2016 8:37 AM Performed by: Oline Belk V Authorized by: Newt Minion   Consent Given by:  Patient Site marked: the procedure site was marked     Timeout: prior to procedure the correct patient, procedure, and site was verified   Indications:  Pain and diagnostic evaluation Location:  Knee Site:  L knee Prep: patient was prepped and draped in usual sterile fashion   Needle Size:  22 G Needle Length:  1.5 inches Approach:  Anteromedial Ultrasound Guidance: No   Fluoroscopic Guidance: No   Arthrogram: No   Medications:  5 mL lidocaine 1 %; 40 mg methylPREDNISolone acetate 40 MG/ML Aspiration Attempted: No   Patient tolerance:  Patient tolerated the procedure well with no immediate complications    Clinical Data: No additional findings.  ROS:  All other systems negative, except as noted in the HPI. Review of Systems  Objective: Vital Signs: Ht 5\' 5"  (1.651 m)   Wt 177 lb (80.3 kg)   BMI 29.45 kg/m   Specialty Comments:  No specialty comments available.  PMFS History: Patient Active Problem List   Diagnosis Date Noted  . Unilateral primary osteoarthritis, left knee 11/26/2016  . Unilateral primary osteoarthritis, right knee 11/26/2016  . Left leg pain   . Abnormal MRI of head   . Vision changes 04/13/2016  . Chronic anticoagulation 08/04/2015  . Pain in limb 12/26/2013  . Chest pain 11/27/2013  . GERD - post failed open Nissen 11/23/2012  . Dysphagia, unspecified(787.20) 09/28/2012  . S/P Open Nissen 1998 Dr. Mendel Ryder 08/18/2012  . 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 Antiphospholipid antibody syndrome ([place]) 06/01/2011  . Lupus anticoagulant positive 06/01/2011  . Chest pain   . Hx-TIA (transient ischemic attack)   . Hypothyroidism   . Breast cancer ([place])   . History of DVT (deep vein thrombosis)   . History of antiphospholipid antibody syndrome    Past Medical History:  Diagnosis Date  . Anxiety    severe panic attacks  . Arthritis   . Aseptic meningitis   . Asthma    as child  . Breast cancer ([place])    right  . Chronic anticoagulation 08/04/2015  . Collapsed lung    hx of,  left  . Complication of anesthesia    "hard to put asleep"  . Depression   . Fibromyalgia   . GERD (gastroesophageal reflux disease)   . H/O hiatal hernia   . Headache(784.0)   . Hepatitis 1990   "from eating at restaurant"  . History of antiphospholipid antibody syndrome   . 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  . Hx-TIA (transient ischemic attack)   . Hyperlipidemia   . Hypertension   . Hypothyroidism   . Neuromuscular disorder ([place])   . Pneumonia    hx of  . Stroke ([place])   . 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  Alzheimer's disease Mother   . Heart disease Mother   . Hyperlipidemia Mother   . Hypertension Mother   . Cancer Maternal Grandmother     colon  . Heart disease Sister   . Hypertension Sister   . Heart attack Sister   . Heart disease Brother   . Heart attack Brother   . Cancer Daughter   . Hypertension Daughter   . Hypertension Son     Past Surgical History:  Procedure Laterality Date  . ABDOMINAL HYSTERECTOMY    . BREAST SURGERY Right    x3  . BUNIONECTOMY Bilateral 30 years ago  . CARDIAC CATHETERIZATION  07/12/1997   NORMAL LEFT VENTRICULAR FUNCTION WITH EF AT LEAST 70-75%  . ESOPHAGEAL MANOMETRY N/A 11/06/2012   Procedure: ESOPHAGEAL MANOMETRY (EM);  Surgeon: Pedro Earls, MD;  Location: [facility];  Service: General;  Laterality: N/A;  . ESOPHAGOGASTRODUODENOSCOPY ENDOSCOPY    . KNEE ARTHROSCOPY Right   . LAPAROSCOPIC NISSEN FUNDOPLICATION N/A 7/61/4709   Procedure: LAPAROSCOPIC TAKEDOWN  PERI-HIATAL HERNIA    REPAIR;  Surgeon: Pedro Earls, MD;  Location: [facility];  Service: General;  Laterality: N/A;  . [place]  . RIGHT OOPHORECTOMY  1980  . THYROIDECTOMY  in 20's  . TONSILLECTOMY  71 years old  . UPPER GI ENDOSCOPY N/A 12/12/2012   Procedure: UPPER GI  ENDOSCOPY;  Surgeon: Pedro Earls, MD;  Location: [facility];  Service: General;  Laterality: N/A;   Social History   Occupational History  . Not on file.   Social History Main Topics  . Smoking status: Never Smoker  . Smokeless tobacco: Never Used  . Alcohol use No  . Drug use: No  . Sexual activity: Not on file

## 2016-11-29 ENCOUNTER — Encounter: Payer: Self-pay | Admitting: *Deleted

## 2016-12-07 ENCOUNTER — Ambulatory Visit (INDEPENDENT_AMBULATORY_CARE_PROVIDER_SITE_OTHER): Payer: Medicare HMO | Admitting: Pharmacist

## 2016-12-07 DIAGNOSIS — D6861 Antiphospholipid syndrome: Secondary | ICD-10-CM

## 2016-12-07 DIAGNOSIS — Z7901 Long term (current) use of anticoagulants: Secondary | ICD-10-CM

## 2016-12-07 DIAGNOSIS — R760 Raised antibody titer: Secondary | ICD-10-CM

## 2016-12-07 LAB — POCT INR: INR: 1.4

## 2016-12-08 NOTE — Patient Instructions (Signed)
Patient educated about medication as defined in this encounter and verbalized understanding by repeating back instructions provided.   

## 2016-12-08 NOTE — Progress Notes (Signed)
Anticoagulation Management [name] is a 71 y.o. female who reports to the clinic for monitoring of warfarin treatment.    Indication: lupus anticoagulant positive, VTE history Duration: indefinite Supervising physician: Murriel Hopper  Anticoagulation Clinic Visit History: Patient does not report signs/symptoms of bleeding or thromboembolism  Anticoagulation Episode Summary    Current INR goal:   2.0-3.0  TTR:   67.7 % (5.4 y)  Next INR check:   12/09/2016  INR from last check:     Weekly max warfarin dose:     Target end date:     INR check location:     Preferred lab:     Send INR reminders to:   [facility]   Indications   Antiphospholipid antibody syndrome ([place]) [D68.61] Lupus anticoagulant positive [R76.0]       Comments:         Anticoagulation Care Providers    Provider Role Specialty Phone number   Annia Belt, MD Referring Oncology [phone]     ASSESSMENT Recent Results: The most recent result is correlated with 32.5 mg per week: Lab Results  Component Value Date   INR 1.4 12/07/2016   INR 2.4 11/10/2016   INR 3.8 10/25/2016   PROTIME 30.0 (H) 03/13/2015   Anticoagulation Dosing: INR as of 12/07/2016 and Previous Warfarin Dosing Information    INR Dt INR Goal Wkly Tot Sun Mon Tue Wed Thu Fri Sat     2.0-3.0 32.5 mg 5 mg 2.5 mg 5 mg 5 mg 5 mg 5 mg 5 mg    Previous description   [name] (route warfarin notes and list as authorizing provider for LOS & Follow-up, lab orders and warfarin prescriptions), do not list attending physician    Anticoagulation Warfarin Dose Instructions as of 12/07/2016      Total Sun Mon Tue Wed Thu Fri Sat   New Dose 37.5 mg 5 mg 5 mg 5 mg 7.5 mg 5 mg 5 mg 5 mg     (5 mg x 1)  (5 mg x 1)  (5 mg x 1)  (5 mg x 1.5)  (5 mg x 1)  (5 mg x 1)  (5 mg x 1)                         Description   DR IFOYDXAJOIN'O PATIENT (route warfarin notes and list as authorizing provider for  LOS & Follow-up, lab orders and warfarin prescriptions), do not list attending physician      INR today: Subtherapeutic  PLAN Weekly dose was increased by 15% to 37.5 mg per week, start enoxaparin 120 mg daily, follow up in 2 days  Patient Instructions  Patient educated about medication as defined in this encounter and verbalized understanding by repeating back instructions provided.   Patient advised to contact clinic or seek medical attention if signs/symptoms of bleeding or thromboembolism occur.  Patient verbalized understanding by repeating back information and was advised to contact me if further medication-related questions arise. Patient was also provided an information handout.  Follow-up Return in about 2 days (around 12/09/2016).  [name]

## 2016-12-09 ENCOUNTER — Ambulatory Visit (INDEPENDENT_AMBULATORY_CARE_PROVIDER_SITE_OTHER): Payer: Medicare HMO | Admitting: Pharmacist

## 2016-12-09 DIAGNOSIS — R760 Raised antibody titer: Secondary | ICD-10-CM | POA: Diagnosis not present

## 2016-12-09 DIAGNOSIS — D6861 Antiphospholipid syndrome: Secondary | ICD-10-CM | POA: Diagnosis not present

## 2016-12-09 DIAGNOSIS — Z7901 Long term (current) use of anticoagulants: Secondary | ICD-10-CM

## 2016-12-09 LAB — POCT INR: INR: 1.8

## 2016-12-09 NOTE — Progress Notes (Signed)
Anti-Coagulation Progress Note  [name] is a 71 y.o. female who is currently on an anti-coagulation regimen on warfarin for the indications of Antiphospholipid antibody syndrome (HCC) [D68.61], Lupus anticoagulant positivity [R76.0]. As part of annual assessment, and with the advice and consent of the patient, a discussion to elect to continue anticoagulation by weighing the benefits vs risk should be held with the patient.     RECENT RESULTS: Recent results are below, the most recent result is correlated with a dose of 37.5 mg. per week: Lab Results  Component Value Date   INR 1.80 12/09/2016   INR 1.4 12/07/2016   INR 2.4 11/10/2016   PROTIME 30.0 (H) 03/13/2015    ANTI-COAG DOSE: Anticoagulation Warfarin Dose Instructions as of 12/09/2016      [name] Tue Wed Thu Fri Sat   New Dose 7.5 mg 5 mg 7.5 mg 5 mg 7.5 mg 5 mg 7.5 mg    Description   [name] (route warfarin notes and list as authorizing provider for LOS & Follow-up, lab orders and warfarin prescriptions), do not list attending physician Patient instructed to take 1& 1/2 tablets of her 5mg  peach-colored warfarin tablets on Sundays, Tuesdays, Thursdays and Saturdays; on Mondays, Wednesdays and Fridays--take only 1 tablet of your 5mg  peach-colored warfarin tablets. Take at [facility] each day as instructed.       ANTICOAG SUMMARY: Anticoagulation Episode Summary    Current INR goal:   2.0-3.0  TTR:   67.7 % (5.5 y)  Next INR check:   01/03/2017  INR from last check:   1.80! 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  Next INR check:   01/03/2017  Target end date:      Indications   Antiphospholipid antibody syndrome (HCC) [D68.61] Lupus anticoagulant positive [R76.0]        Anticoagulation Episode Summary    INR check location:      Preferred lab:      Send INR reminders to:   [facility]   Comments:       Anticoagulation Care Providers    Provider Role Specialty Phone number   Annia Belt, MD Referring Oncology [phone]      PATIENT INSTRUCTIONS: Patient Instructions  Patient instructed to take medications as defined in the Anti-coagulation Track section of this encounter.  Patient instructed to take today's dose.  Patient instructed to take 1& 1/2 tablets of her 5mg  peach-colored warfarin tablets on Sundays, Tuesdays, Thursdays and Saturdays; on Mondays, Wednesdays and Fridays--take only 1 tablet of your 5mg  peach-colored warfarin tablets. Take at [facility] each day as instructed. Patient verbalized understanding of these instructions.       FOLLOW-UP Return in 4 weeks (on 01/03/2017) for Follow up INR at 1045h.  [name], III Pharm.D., CACP

## 2016-12-09 NOTE — Patient Instructions (Signed)
Patient instructed to take medications as defined in the Anti-coagulation Track section of this encounter.  Patient instructed to take today's dose.  Patient instructed to take 1& 1/2 tablets of her 5mg  peach-colored warfarin tablets on Sundays, Tuesdays, Thursdays and Saturdays; on Mondays, Wednesdays and Fridays--take only 1 tablet of your 5mg  peach-colored warfarin tablets. Take at [facility] each day as instructed. Patient verbalized understanding of these instructions.

## 2016-12-09 NOTE — Progress Notes (Signed)
Reviewed Thanks drG 

## 2017-01-03 ENCOUNTER — Ambulatory Visit (INDEPENDENT_AMBULATORY_CARE_PROVIDER_SITE_OTHER): Payer: Medicare HMO | Admitting: Pharmacist

## 2017-01-03 DIAGNOSIS — R760 Raised antibody titer: Secondary | ICD-10-CM

## 2017-01-03 DIAGNOSIS — Z7901 Long term (current) use of anticoagulants: Secondary | ICD-10-CM | POA: Diagnosis not present

## 2017-01-03 DIAGNOSIS — D6861 Antiphospholipid syndrome: Secondary | ICD-10-CM

## 2017-01-03 LAB — POCT INR: INR: 2.1

## 2017-01-03 NOTE — Progress Notes (Signed)
Reviewed Thanks DrG 

## 2017-01-03 NOTE — Progress Notes (Signed)
Anticoagulation Management [name] is a 71 y.o. female who reports to the clinic for monitoring of warfarin treatment.    Indication:Antiphospholipid antibody syndrome, Lupus anticoagulant positive and long term anticoagulation monitoring. Duration: indefinite Supervising physician: Murriel Hopper  Anticoagulation Clinic Visit History: Patient does not report signs/symptoms of bleeding or thromboembolism  Other recent changes: None. Anticoagulation Episode Summary    Current INR goal:   2.0-3.0  TTR:   67.2 % (5.5 y)  Next INR check:   01/31/2017  INR from last check:   2.10 (01/03/2017)  Weekly max warfarin dose:     Target end date:     INR check location:     Preferred lab:     Send INR reminders to:   [facility]   Indications   Antiphospholipid antibody syndrome ([place]) [D68.61] Lupus anticoagulant positive [R76.0]       Comments:         Anticoagulation Care Providers    Provider Role Specialty Phone number   Annia Belt, MD Referring Oncology [phone]     ASSESSMENT Recent Results: The most recent result is correlated with 45 mg per week: Lab Results  Component Value Date   INR 2.10 01/03/2017   INR 1.80 12/09/2016   INR 1.4 12/07/2016   PROTIME 30.0 (H) 03/13/2015    Anticoagulation Dosing: INR as of 01/03/2017 and Previous Warfarin Dosing Information    INR Dt INR Goal Wkly Tot Sun Mon Tue Wed Thu Fri Sat   01/03/2017 2.10 2.0-3.0 45 mg 7.5 mg 5 mg 7.5 mg 5 mg 7.5 mg 5 mg 7.5 mg    Previous description   [name] (route warfarin notes and list as authorizing provider for LOS & Follow-up, lab orders and warfarin prescriptions), do not list attending physician Patient instructed to take 1& 1/2 tablets of her 5mg  peach-colored warfarin tablets on Sundays, Tuesdays, Thursdays and Saturdays; on Mondays, Wednesdays and Fridays--take only 1 tablet of your 5mg  peach-colored warfarin tablets. Take at [facility] each day as  instructed.    Anticoagulation Warfarin Dose Instructions as of 01/03/2017      Total Sun Mon Tue Wed Thu Fri Sat   New Dose 47.5 mg 7.5 mg 7.5 mg 7.5 mg 5 mg 7.5 mg 5 mg 7.5 mg     (5 mg x 1.5)  (5 mg x 1.5)  (5 mg x 1.5)  (5 mg x 1)  (5 mg x 1.5)  (5 mg x 1)  (5 mg x 1.5)                         Description   [name] (route warfarin notes and list as authorizing provider for LOS & Follow-up, lab orders and warfarin prescriptions), do not list attending physician Patient instructed to take 1& 1/2 tablets of her 5mg  peach-colored warfarin tablets on Sundays, Mondays, Tuesdays, Thursdays and Saturdays; on Wednesdays and Fridays--take only 1 tablet of your 5mg  peach-colored warfarin tablets. Take at [facility] each day as instructed.      INR today: Therapeutic  PLAN Weekly dose was increased by 1% plus to 47.5 mg per week  Patient Instructions  Patient instructed to take medications as defined in the Anti-coagulation Track section of this encounter.  Patient instructed to take today's dose.  Patient instructed to take  1& 1/2 tablets of her 5mg  peach-colored warfarin tablets on Sundays, Mondays, Tuesdays, Thursdays and Saturdays; on Wednesdays and Fridays--take only 1  tablet of your 5mg  peach-colored warfarin tablets. Take at [facility] each day as instructed. Patient verbalized understanding of these instructions.     Patient advised to contact clinic or seek medical attention if signs/symptoms of bleeding or thromboembolism occur.  Patient verbalized understanding by repeating back information and was advised to contact me if further medication-related questions arise. Patient was also provided an information handout.  Follow-up Return in 4 weeks (on 01/31/2017) for Follow up INR at 1015h.  Ceasar Lund, Eustaquio Boyden PharmD, CACP Professor of [employer] Specialist-Anticoagulation, [place]  15 minutes spent face-to-face with the patient during the  encounter. 50% of time spent on education. 50% of time was spent on finger stick point of care sample collection, processing, interpretation and data-entry into [email or web address] and HCL.

## 2017-01-03 NOTE — Patient Instructions (Signed)
Patient instructed to take medications as defined in the Anti-coagulation Track section of this encounter.  Patient instructed to take today's dose.  Patient instructed to take  1& 1/2 tablets of her 5mg  peach-colored warfarin tablets on Sundays, Mondays, Tuesdays, Thursdays and Saturdays; on Wednesdays and Fridays--take only 1 tablet of your 5mg  peach-colored warfarin tablets. Take at [facility] each day as instructed. Patient verbalized understanding of these instructions.

## 2017-01-13 ENCOUNTER — Other Ambulatory Visit: Payer: Self-pay | Admitting: Oncology

## 2017-01-13 ENCOUNTER — Telehealth: Payer: Self-pay

## 2017-01-13 DIAGNOSIS — D6861 Antiphospholipid syndrome: Secondary | ICD-10-CM

## 2017-01-13 MED ORDER — WARFARIN SODIUM 5 MG PO TABS: ORAL_TABLET | ORAL | 3 refills | Status: DC

## 2017-01-13 NOTE — Telephone Encounter (Signed)
Fax from pharmacy requesting 90 day supply warfarin 7.5 mg please clarify and send in new rx

## 2017-01-20 NOTE — Telephone Encounter (Signed)
Refilled 6/21 per Dr Beryle Beams.

## 2017-01-31 ENCOUNTER — Telehealth: Payer: Self-pay | Admitting: *Deleted

## 2017-01-31 ENCOUNTER — Other Ambulatory Visit (INDEPENDENT_AMBULATORY_CARE_PROVIDER_SITE_OTHER): Payer: Medicare HMO

## 2017-01-31 ENCOUNTER — Ambulatory Visit (INDEPENDENT_AMBULATORY_CARE_PROVIDER_SITE_OTHER): Payer: Medicare HMO | Admitting: Pharmacist

## 2017-01-31 DIAGNOSIS — R760 Raised antibody titer: Secondary | ICD-10-CM

## 2017-01-31 DIAGNOSIS — R930 Abnormal findings on diagnostic imaging of skull and head, not elsewhere classified: Secondary | ICD-10-CM

## 2017-01-31 DIAGNOSIS — C50511 Malignant neoplasm of lower-outer quadrant of right female breast: Secondary | ICD-10-CM

## 2017-01-31 DIAGNOSIS — Z7901 Long term (current) use of anticoagulants: Secondary | ICD-10-CM | POA: Diagnosis not present

## 2017-01-31 DIAGNOSIS — D6861 Antiphospholipid syndrome: Secondary | ICD-10-CM

## 2017-01-31 DIAGNOSIS — Z8673 Personal history of transient ischemic attack (TIA), and cerebral infarction without residual deficits: Secondary | ICD-10-CM

## 2017-01-31 DIAGNOSIS — Z170 Estrogen receptor positive status [ER+]: Secondary | ICD-10-CM

## 2017-01-31 DIAGNOSIS — I742 Embolism and thrombosis of arteries of the upper extremities: Secondary | ICD-10-CM

## 2017-01-31 LAB — POCT INR: INR: 3.4

## 2017-01-31 NOTE — Patient Instructions (Signed)
Patient instructed to take medications as defined in the Anti-coagulation Track section of this encounter.  Patient instructed to take today's dose.  Patient instructed to take 1& 1/2 tablets of her 5mg  peach-colored warfarin tablets on  Tuesdays, Thursdays and Saturdays; on ALL OTHER DAYS, take only 1 tablet of your 5mg  peach-colored warfarin tablets. Take at [facility] each day as instructed. LAST DOSE of warfarin is 14-July-18 in preparation for colonoscopy to be performed on 18-July-18. Return to clinic on Monday July 16 for INR determination. Patient verbalized understanding of these instructions.

## 2017-01-31 NOTE — Telephone Encounter (Signed)
Call from pt - stated she's having endo/colonoscopy on July 18. Dr Elie Confer told her today to stop Coumadin x 5 days; stated she has never been off Coumadin this long even with major surgery - max has been 3 days. Wants to know how long should she stop Coumadin prior to procedure?

## 2017-01-31 NOTE — Telephone Encounter (Signed)
I would only stop for 3 days. Stop Sunday 7/15. Resume night of endocscopy

## 2017-01-31 NOTE — Progress Notes (Signed)
Anticoagulation Management [name] is a 71 y.o. female who reports to the clinic for monitoring of warfarin treatment.    Indication:  Antiphospholipid antibody syndrome ([facility]) [D68.61], lupus anticoagulant positive [76.0] Duration: indefinite Supervising physician: Murriel Hopper  Anticoagulation Clinic Visit History: Patient does not report signs/symptoms of bleeding or thromboembolism  Other recent changes: No diet, medications, lifestyle changes endorsed by the patient.  Anticoagulation Episode Summary    Current INR goal:   2.0-3.0  TTR:   67.3 % (5.6 y)  Next INR check:   02/07/2017  INR from last check:   3.40! (01/31/2017)  Weekly max warfarin dose:     Target end date:     INR check location:     Preferred lab:     Send INR reminders to:   [facility]   Indications   Antiphospholipid antibody syndrome ([place]) [D68.61] Lupus anticoagulant positive [R76.0]       Comments:         Anticoagulation Care Providers    Provider Role Specialty Phone number   Annia Belt, MD Referring Oncology [phone]     ASSESSMENT Recent Results: The most recent result is correlated with 47.5 mg per week: Lab Results  Component Value Date   INR 3.40 01/31/2017   INR 2.10 01/03/2017   INR 1.80 12/09/2016   PROTIME 30.0 (H) 03/13/2015    Anticoagulation Dosing: INR as of 01/31/2017 and Previous Warfarin Dosing Information    INR Dt INR Goal Wkly Tot Sun Mon Tue Wed Thu Fri Sat   01/31/2017 3.40 2.0-3.0 47.5 mg 7.5 mg 7.5 mg 7.5 mg 5 mg 7.5 mg 5 mg 7.5 mg    Previous description   [name] (route warfarin notes and list as authorizing provider for LOS & Follow-up, lab orders and warfarin prescriptions), do not list attending physician Patient instructed to take 1& 1/2 tablets of her 5mg  peach-colored warfarin tablets on Sundays, Mondays, Tuesdays, Thursdays and Saturdays; on Wednesdays and Fridays--take only 1 tablet of your 5mg  peach-colored  warfarin tablets. Take at [facility] each day as instructed.    Anticoagulation Warfarin Dose Instructions as of 01/31/2017      Total Sun Mon Tue Wed Thu Fri Sat   New Dose 42.5 mg 5 mg 5 mg 7.5 mg 5 mg 7.5 mg 5 mg 7.5 mg     (5 mg x 1)  (5 mg x 1)  (5 mg x 1.5)  (5 mg x 1)  (5 mg x 1.5)  (5 mg x 1)  (5 mg x 1.5)                         Description   [name] (route warfarin notes and list as authorizing provider for LOS & Follow-up, lab orders and warfarin prescriptions), do not list attending physician Patient instructed to take 1& 1/2 tablets of her 5mg  peach-colored warfarin tablets on  Tuesdays, Thursdays and Saturdays; on ALL OTHER DAYS, take only 1 tablet of your 5mg  peach-colored warfarin tablets. Take at [facility] each day as instructed. LAST DOSE of warfarin is 14-July-18 in preparation for colonoscopy to be performed on 18-July-18. Return to clinic on Monday July 16 for INR determination.      INR today: Supratherapeutic  PLAN Weekly dose was decreased by 5mg  total per week--to 42.5mg  warfarin per week. She will OMIT warfarin for 3 days prior to planned colonoscopy on 18-JUL-18.  Patient Instructions  Patient instructed  to take medications as defined in the Anti-coagulation Track section of this encounter.  Patient instructed to take today's dose.  Patient instructed to take 1& 1/2 tablets of her 5mg  peach-colored warfarin tablets on  Tuesdays, Thursdays and Saturdays; on ALL OTHER DAYS, take only 1 tablet of your 5mg  peach-colored warfarin tablets. Take at [facility] each day as instructed. LAST DOSE of warfarin is 14-July-18 in preparation for colonoscopy to be performed on 18-July-18. Return to clinic on Monday July 16 for INR determination. Patient verbalized understanding of these instructions.     Patient advised to contact clinic or seek medical attention if signs/symptoms of bleeding or thromboembolism occur.  Patient verbalized understanding by repeating back  information and was advised to contact me if further medication-related questions arise. Patient was also provided an information handout.  Follow-up Return in 7 days (on 02/07/2017) for Follow up INR at 1015h.  Caryl Bis PharmD, CACP, CPP  15 minutes spent face-to-face with the patient during the encounter. 50% of time spent on education. 50% of time was spent on fingerstick point of care INR sample collection, processing, interpretation and documentation within EPIC/CHL and [email or web address]

## 2017-02-01 ENCOUNTER — Telehealth: Payer: Self-pay | Admitting: *Deleted

## 2017-02-01 LAB — CBC WITH DIFFERENTIAL/PLATELET
Basophils Absolute: 0.1 10*3/uL (ref 0.0–0.2)
Basos: 1 %
EOS (ABSOLUTE): 0.2 10*3/uL (ref 0.0–0.4)
Eos: 2 %
Hematocrit: 39.1 % (ref 34.0–46.6)
Hemoglobin: 13.2 g/dL (ref 11.1–15.9)
Immature Grans (Abs): 0 10*3/uL (ref 0.0–0.1)
Immature Granulocytes: 0 %
Lymphocytes Absolute: 2.1 10*3/uL (ref 0.7–3.1)
Lymphs: 29 %
MCH: 29.9 pg (ref 26.6–33.0)
MCHC: 33.8 g/dL (ref 31.5–35.7)
MCV: 89 fL (ref 79–97)
Monocytes Absolute: 0.6 10*3/uL (ref 0.1–0.9)
Monocytes: 8 %
Neutrophils Absolute: 4.2 10*3/uL (ref 1.4–7.0)
Neutrophils: 60 %
Platelets: 245 10*3/uL (ref 150–379)
RBC: 4.42 x10E6/uL (ref 3.77–5.28)
RDW: 15.3 % (ref 12.3–15.4)
WBC: 7 10*3/uL (ref 3.4–10.8)

## 2017-02-01 LAB — COMPREHENSIVE METABOLIC PANEL
ALT: 22 IU/L (ref 0–32)
AST: 24 IU/L (ref 0–40)
Albumin/Globulin Ratio: 2.2 (ref 1.2–2.2)
Albumin: 4.6 g/dL (ref 3.6–4.8)
Alkaline Phosphatase: 93 IU/L (ref 39–117)
BUN/Creatinine Ratio: 14 (ref 12–28)
BUN: 17 mg/dL (ref 8–27)
Bilirubin Total: 0.5 mg/dL (ref 0.0–1.2)
CO2: 23 mmol/L (ref 20–29)
Calcium: 9.7 mg/dL (ref 8.7–10.3)
Chloride: 100 mmol/L (ref 96–106)
Creatinine, Ser: 1.24 mg/dL — ABNORMAL HIGH (ref 0.57–1.00)
GFR calc Af Amer: 51 mL/min/{1.73_m2} — ABNORMAL LOW (ref >59)
GFR calc non Af Amer: 44 mL/min/{1.73_m2} — ABNORMAL LOW (ref >59)
Globulin, Total: 2.1 g/dL (ref 1.5–4.5)
Glucose: 83 mg/dL (ref 65–99)
Potassium: 4.6 mmol/L (ref 3.5–5.2)
Sodium: 142 mmol/L (ref 134–144)
Total Protein: 6.7 g/dL (ref 6.0–8.5)

## 2017-02-01 NOTE — Telephone Encounter (Signed)
-----   Message from Annia Belt, MD sent at 02/01/2017  8:54 AM EDT ----- Call pt: lab good. Kidney function better than last time

## 2017-02-01 NOTE — Telephone Encounter (Signed)
Pt called / informed "lab good. Kidney function better than last time " per Dr Beryle Beams.

## 2017-02-01 NOTE — Telephone Encounter (Signed)
Pt called / informed "only stop for 3 days. Stop Sunday 7/15. Resume night of endoscopy" per Dr Beryle Beams.  Voiced understanding; "thank you for calling'.

## 2017-02-07 ENCOUNTER — Ambulatory Visit (INDEPENDENT_AMBULATORY_CARE_PROVIDER_SITE_OTHER): Payer: Medicare HMO | Admitting: Pharmacist

## 2017-02-07 DIAGNOSIS — D6861 Antiphospholipid syndrome: Secondary | ICD-10-CM | POA: Diagnosis not present

## 2017-02-07 DIAGNOSIS — Z7901 Long term (current) use of anticoagulants: Secondary | ICD-10-CM

## 2017-02-07 DIAGNOSIS — S92351A Displaced fracture of fifth metatarsal bone, right foot, initial encounter for closed fracture: Secondary | ICD-10-CM | POA: Insufficient documentation

## 2017-02-07 DIAGNOSIS — R760 Raised antibody titer: Secondary | ICD-10-CM

## 2017-02-07 LAB — POCT INR: INR: 3.6

## 2017-02-07 NOTE — Progress Notes (Signed)
Anticoagulation Management [name] is a 71 y.o. female who reports to the clinic for monitoring of warfarin treatment.    Indication: APLABS, lupus anticoagulant positive. Duration: indefinite Supervising physician: Murriel Hopper  Anticoagulation Clinic Visit History: Patient does not report signs/symptoms of bleeding or thromboembolism  Other recent changes: YES--reports readying for upcoming upper and lower GI medicine invasive studies.  Anticoagulation Episode Summary    Current INR goal:   2.0-3.0  TTR:   67.0 % (5.6 y)  Next INR check:   02/21/2017  INR from last check:   3.6! (02/07/2017)  Weekly max warfarin dose:     Target end date:     INR check location:     Preferred lab:     Send INR reminders to:   [facility]   Indications   Antiphospholipid antibody syndrome ([place]) [D68.61] Lupus anticoagulant positive [R76.0]       Comments:         Anticoagulation Care Providers    Provider Role Specialty Phone number   Annia Belt, MD Referring Oncology [phone]     ASSESSMENT Recent Results: The most recent result is correlated with 37.5 mg per week--as her "HOLD" began last night (no dose); she similarly will HOLD/OMIT warfarin (see instructions) until time of her GI studies: Lab Results  Component Value Date   INR 3.6 02/07/2017   INR 3.40 01/31/2017   INR 2.10 01/03/2017   PROTIME 30.0 (H) 03/13/2015    Anticoagulation Dosing: INR as of 02/07/2017 and Previous Warfarin Dosing Information    INR Dt INR Goal Wkly Tot Sun Mon Tue Wed Thu Fri Sat   02/07/2017 3.6 2.0-3.0 37.5 mg 0 mg 5 mg 7.5 mg 5 mg 7.5 mg 5 mg 7.5 mg   Patient deviated from recommended dosing.       Previous description   [name] (route warfarin notes and list as authorizing provider for LOS & Follow-up, lab orders and warfarin prescriptions), do not list attending physician Patient instructed to take 1& 1/2 tablets of her 5mg  peach-colored warfarin  tablets on  Tuesdays, Thursdays and Saturdays; on ALL OTHER DAYS, take only 1 tablet of your 5mg  peach-colored warfarin tablets. Take at [facility] each day as instructed. LAST DOSE of warfarin is 14-July-18 in preparation for colonoscopy to be performed on 18-July-18. Return to clinic on Monday July 16 for INR determination.    Anticoagulation Warfarin Dose Instructions as of 02/07/2017      Total Sun Mon Tue Wed Thu Fri Sat   New Dose 42.5 mg 5 mg 5 mg 7.5 mg 5 mg 7.5 mg 5 mg 7.5 mg     (5 mg x 1)  (5 mg x 1)  (5 mg x 1.5)  (5 mg x 1)  (5 mg x 1.5)  (5 mg x 1)  (5 mg x 1.5)                         Description   [name] (route warfarin notes and list as authorizing provider for LOS & Follow-up, lab orders and warfarin prescriptions), do not list attending physician Patient instructed OMIT today's dose, tomorrow's dose and dose of warfarin for Wednesday February 09, 2017. DO NOT TAKE ANY warfarin until AFTER your procedures, then--use instructions provided (1&1/2 tablets on Tuesdays, Thursdays and Saturdays; 1 tablet all other days). Return to [facility] on Monday 30-JUL-18 at 1045h.      INR today: Supratherapeutic  PLAN Weekly dose was decreased by OMITTING warfarin for 3 consecutive days prior to GI studies planned for 18-JUL-18.Will recommence as shown in patient instructions AFTER procedures.   Patient Instructions  Patient instructed to take medications as defined in the Anti-coagulation Track section of this encounter.  Patient instructed to take today's dose.  Patient instructed to do the following:   Patient instructed OMIT today's dose, tomorrow's dose and dose of warfarin for Wednesday February 09, 2017. DO NOT TAKE ANY warfarin until AFTER your procedures, then--use instructions provided (1&1/2 tablets on Tuesdays, Thursdays and Saturdays; 1 tablet all other days). Return to [facility] on Monday 30-JUL-18 at 1045h.  Patient verbalized understanding of these instructions.      Patient  advised to contact clinic or seek medical attention if signs/symptoms of bleeding or thromboembolism occur.  Patient verbalized understanding by repeating back information and was advised to contact me if further medication-related questions arise. Patient was also provided an information handout.  Follow-up Return in 2 weeks (on 02/21/2017) for Follow up INR at 1045h.  Caryl Bis PharmD, CACP, CPP  15 minutes spent face-to-face with the patient during the encounter. 50% of time spent on education. 50% of time was spent on fingerstick point of care INR sample collection, processing, interpretation and data-entry in to EPIC/CHL and [email or web address]

## 2017-02-07 NOTE — Patient Instructions (Signed)
Patient instructed to take medications as defined in the Anti-coagulation Track section of this encounter.  Patient instructed to take today's dose.  Patient instructed to do the following:   Patient instructed OMIT today's dose, tomorrow's dose and dose of warfarin for Wednesday February 09, 2017. DO NOT TAKE ANY warfarin until AFTER your procedures, then--use instructions provided (1&1/2 tablets on Tuesdays, Thursdays and Saturdays; 1 tablet all other days). Return to [facility] on Monday 30-JUL-18 at 1045h.  Patient verbalized understanding of these instructions.

## 2017-02-07 NOTE — Progress Notes (Signed)
Reviewed Thanks DrG 

## 2017-02-08 ENCOUNTER — Ambulatory Visit: Payer: Medicare HMO | Admitting: Oncology

## 2017-02-09 ENCOUNTER — Ambulatory Visit (INDEPENDENT_AMBULATORY_CARE_PROVIDER_SITE_OTHER): Payer: Medicare HMO | Admitting: Pharmacist

## 2017-02-09 DIAGNOSIS — Z7901 Long term (current) use of anticoagulants: Secondary | ICD-10-CM | POA: Diagnosis not present

## 2017-02-09 DIAGNOSIS — D6861 Antiphospholipid syndrome: Secondary | ICD-10-CM

## 2017-02-09 DIAGNOSIS — R760 Raised antibody titer: Secondary | ICD-10-CM | POA: Diagnosis not present

## 2017-02-09 LAB — POCT INR: INR: 1.4

## 2017-02-09 NOTE — Patient Instructions (Signed)
Patient instructed to take medications as defined in the Anti-coagulation Track section of this encounter.  Patient instructed to OMIT today's dose unless your GI medicine physician indicates you can start back today. Otherwise, recommence warfarin tomorrow based upon written instructions provided.  Patient verbalized understanding of these instructions.

## 2017-02-09 NOTE — Progress Notes (Signed)
Anticoagulation Management [name] is a 71 y.o. female who reports to the clinic for monitoring of warfarin treatment.    Indication: DVT   Duration: indefinite Supervising physician: Murriel Hopper  Anticoagulation Clinic Visit History: Patient does not report signs/symptoms of bleeding or thromboembolism  Other recent changes: No diet, medications, lifestyle changes.  Anticoagulation Episode Summary    Current INR goal:   2.0-3.0  TTR:   67.0 % (5.6 y)  Next INR check:   02/14/2017  INR from last check:     Weekly max warfarin dose:     Target end date:     INR check location:     Preferred lab:     Send INR reminders to:   [facility]   Indications   Antiphospholipid antibody syndrome ([place]) [D68.61] Lupus anticoagulant positive [R76.0]       Comments:         Anticoagulation Care Providers    Provider Role Specialty Phone number   Annia Belt, MD Referring Oncology [phone]     ASSESSMENT Recent Results: The most recent result is correlated with HAVING HELD WARFARIN for FOUR DAYS.  Lab Results  Component Value Date   INR 1.4 02/09/2017   INR 3.6 02/07/2017   INR 3.40 01/31/2017   PROTIME 30.0 (H) 03/13/2015    Anticoagulation Dosing: INR as of 02/09/2017 and Previous Warfarin Dosing Information    INR Dt INR Goal Wkly Tot Sun Mon Tue Wed Thu Fri Sat     2.0-3.0 20 mg 0 mg 0 mg 0 mg 0 mg 7.5 mg 5 mg 7.5 mg   Patient deviated from recommended dosing.       Previous description   [name] (route warfarin notes and list as authorizing provider for LOS & Follow-up, lab orders and warfarin prescriptions), do not list attending physician Patient instructed OMIT today's dose, tomorrow's dose and dose of warfarin for Wednesday February 09, 2017. DO NOT TAKE ANY warfarin until AFTER your procedures, then--use instructions provided (1&1/2 tablets on Tuesdays, Thursdays and Saturdays; 1 tablet all other days). Return to [facility] on  Monday 30-JUL-18 at 1045h.    Anticoagulation Warfarin Dose Instructions as of 02/09/2017      Total Sun Mon Tue Wed Thu Fri Sat   New Dose 42.5 mg 5 mg 5 mg 7.5 mg 5 mg 7.5 mg 5 mg 7.5 mg     (5 mg x 1)  (5 mg x 1)  (5 mg x 1.5)  (5 mg x 1)  (5 mg x 1.5)  (5 mg x 1)  (5 mg x 1.5)                         Description   [name] (route warfarin notes and list as authorizing provider for LOS & Follow-up, lab orders and warfarin prescriptions), do not list attending physician Patient instructed OMIT today's dose, tomorrow's dose and dose of warfarin for Wednesday February 09, 2017. DO NOT TAKE ANY warfarin until AFTER your procedures, then--use instructions provided (1&1/2 tablets on Tuesdays, Thursdays and Saturdays; 1 tablet all other days). Return to [facility] on Monday 30-JUL-18 at 1045h.      INR today: Subtherapeutic  PLAN Weekly dose was N/A   Patient Instructions  Patient instructed to take medications as defined in the Anti-coagulation Track section of this encounter.  Patient instructed to OMIT today's dose unless your GI medicine physician indicates you can start  back today. Otherwise, recommence warfarin tomorrow based upon written instructions provided.  Patient verbalized understanding of these instructions.     Patient advised to contact clinic or seek medical attention if signs/symptoms of bleeding or thromboembolism occur.  Patient verbalized understanding by repeating back information and was advised to contact me if further medication-related questions arise. Patient was also provided an information handout.  Follow-up Return in 5 days (on 02/14/2017) for Follow up INR at 0900h.  Caryl Bis PharmD, CACP, CPP  15 minutes spent face-to-face with the patient during the encounter. 50% of time spent on education. 50% of time was spent on fingerstick point of care INR determination.

## 2017-02-09 NOTE — Progress Notes (Signed)
Reviewed and agree Thanks DrG

## 2017-02-14 ENCOUNTER — Ambulatory Visit (INDEPENDENT_AMBULATORY_CARE_PROVIDER_SITE_OTHER): Payer: Medicare HMO | Admitting: Pharmacist

## 2017-02-14 DIAGNOSIS — Z86718 Personal history of other venous thrombosis and embolism: Secondary | ICD-10-CM

## 2017-02-14 DIAGNOSIS — Z7901 Long term (current) use of anticoagulants: Secondary | ICD-10-CM

## 2017-02-14 DIAGNOSIS — R760 Raised antibody titer: Secondary | ICD-10-CM | POA: Diagnosis not present

## 2017-02-14 DIAGNOSIS — D6861 Antiphospholipid syndrome: Secondary | ICD-10-CM

## 2017-02-14 LAB — POCT INR: INR: 1.9

## 2017-02-14 NOTE — Patient Instructions (Signed)
Patient instructed to take medications as defined in the Anti-coagulation Track section of this encounter.  Patient instructed to take today's dose.  Patient instructed to take 1 & 1/2 tablets of your 5mg  peach-colored warfarin tablets on Mondays, Wednesdays and Fridays; all other days--take only ONE (1) tablet. Patient verbalized understanding of these instructions.

## 2017-02-14 NOTE — Progress Notes (Signed)
Anticoagulation Management [name] is a 71 y.o. female who reports to the clinic for monitoring of warfarin treatment.    Indication: Antiphospholipid antibody syndrome, lupus anticoagulant positive with history of upper extremity VTE.  Duration: indefinite Supervising physician: Murriel Hopper  Anticoagulation Clinic Visit History: Patient does not report signs/symptoms of bleeding or thromboembolism  Other recent changes: No diet, medications, lifestyle changes endorsed by the patient other than documented in patient findings.  Anticoagulation Episode Summary    Current INR goal:   2.0-3.0  TTR:   66.9 % (5.6 y)  Next INR check:   03/07/2017  INR from last check:   1.90! (02/14/2017)  Weekly max warfarin dose:     Target end date:     INR check location:     Preferred lab:     Send INR reminders to:   [facility]   Indications   Antiphospholipid antibody syndrome ([place]) [D68.61] Lupus anticoagulant positive [R76.0]       Comments:         Anticoagulation Care Providers    Provider Role Specialty Phone number   Annia Belt, MD Referring Oncology [phone]     ASSESSMENT Recent Results: The most recent result is correlated with having held warfarin for 3 days prior to invasive proceduring. Lab Results  Component Value Date   INR 1.90 02/14/2017   INR 1.4 02/09/2017   INR 3.6 02/07/2017   PROTIME 30.0 (H) 03/13/2015    Anticoagulation Dosing: INR as of 02/14/2017 and Previous Warfarin Dosing Information    INR Dt INR Goal [name] Sun Mon Tue Wed Thu Fri Sat   02/14/2017 1.90 2.0-3.0 42.5 mg 5 mg 5 mg 7.5 mg 5 mg 7.5 mg 5 mg 7.5 mg   Patient deviated from recommended dosing.       Previous description   [name] (route warfarin notes and list as authorizing provider for LOS & Follow-up, lab orders and warfarin prescriptions), do not list attending physician Patient instructed OMIT today's dose, tomorrow's dose and dose of  warfarin for Wednesday February 09, 2017. DO NOT TAKE ANY warfarin until AFTER your procedures, then--use instructions provided (1&1/2 tablets on Tuesdays, Thursdays and Saturdays; 1 tablet all other days). Return to [facility] on Monday 30-JUL-18 at 1045h.    Anticoagulation Warfarin Dose Instructions as of 02/14/2017      Total Sun Mon Tue Wed Thu Fri Sat   New Dose 42.5 mg 5 mg 7.5 mg 5 mg 7.5 mg 5 mg 7.5 mg 5 mg     (5 mg x 1)  (5 mg x 1.5)  (5 mg x 1)  (5 mg x 1.5)  (5 mg x 1)  (5 mg x 1.5)  (5 mg x 1)                         Description   [name] (route warfarin notes and list as authorizing provider for LOS & Follow-up, lab orders and warfarin prescriptions), do not list attending physician.  Patient instructed to take  1 & 1/2 tablets of your 5mg  peach-colored warfarin tablets on Mondays, Wednesdays and Fridays; all other days--take only ONE (1) tablet.      INR today: Therapeutic  PLAN Weekly dose was increased back to her previous weekly dose of 42.5mg /wk warfarin.  Patient Instructions  Patient instructed to take medications as defined in the Anti-coagulation Track section of this encounter.  Patient instructed to  take today's dose.  Patient instructed to take 1 & 1/2 tablets of your 5mg  peach-colored warfarin tablets on Mondays, Wednesdays and Fridays; all other days--take only ONE (1) tablet. Patient verbalized understanding of these instructions.     Patient advised to contact clinic or seek medical attention if signs/symptoms of bleeding or thromboembolism occur.  Patient verbalized understanding by repeating back information and was advised to contact me if further medication-related questions arise. Patient was also provided an information handout.  Follow-up Return in 3 weeks (on 03/07/2017) for Follow up INR at 1015h.  Caryl Bis PharmD, CACP, CPP  15 minutes spent face-to-face with the patient during the encounter. 50% of time spent on  education. 50% of time was spent on finger-stick point of care INR sample collection, processing, interpretation of results and discussion with the patient, data entry in EPIC/CHL and [email or web address] .

## 2017-02-21 ENCOUNTER — Ambulatory Visit: Payer: Medicare HMO

## 2017-03-07 ENCOUNTER — Ambulatory Visit (INDEPENDENT_AMBULATORY_CARE_PROVIDER_SITE_OTHER): Payer: Medicare HMO | Admitting: Pharmacist

## 2017-03-07 DIAGNOSIS — R760 Raised antibody titer: Secondary | ICD-10-CM | POA: Diagnosis not present

## 2017-03-07 DIAGNOSIS — Z7901 Long term (current) use of anticoagulants: Secondary | ICD-10-CM

## 2017-03-07 DIAGNOSIS — Z86718 Personal history of other venous thrombosis and embolism: Secondary | ICD-10-CM

## 2017-03-07 DIAGNOSIS — D6861 Antiphospholipid syndrome: Secondary | ICD-10-CM

## 2017-03-07 LAB — POCT INR: INR: 2.9

## 2017-03-07 NOTE — Progress Notes (Signed)
Reviewed thx drG 

## 2017-03-07 NOTE — Progress Notes (Signed)
Anticoagulation Management [name] is a 71 y.o. female who reports to the clinic for monitoring of warfarin treatment.    Indication: DVT , history of; antiphospholipid antibody syndrome requiring long term anticoagulation.  Duration: indefinite Supervising physician: Murriel Hopper  Anticoagulation Clinic Visit History: Patient does not report signs/symptoms of bleeding or thromboembolism  Other recent changes: No diet, medications, lifestyle changes endorsed by the patient to me.  Anticoagulation Episode Summary    Current INR goal:   2.0-3.0  TTR:   67.1 % (5.7 y)  Next INR check:   04/04/2017  INR from last check:   2.90 (03/07/2017)  Weekly max warfarin dose:     Target end date:     INR check location:     Preferred lab:     Send INR reminders to:   [facility]   Indications   Antiphospholipid antibody syndrome ([place]) [D68.61] Lupus anticoagulant positive [R76.0]       Comments:         Anticoagulation Care Providers    Provider Role Specialty Phone number   Annia Belt, MD Referring Oncology [phone]      Allergies  Allergen Reactions  . Bee Venom Anaphylaxis  . Hornet Venom Anaphylaxis    Anaphylaxis Shock SHOCK  . Reglan [Metoclopramide] Shortness Of Breath and Other (See Comments)    Tongue got numb; didn't feel good  . Vitamin K Anaphylaxis and Rash    "kills me" iv  . Benadryl [Diphenhydramine Hcl]     Rash  . Crestor [Rosuvastatin Calcium]     Muscle Aches  . Doxycycline     Tongue burning, thrush  . Levofloxacin     Rash  . Zocor [Simvastatin]     Muscle Aches  . Ciprofloxacin Rash  . Daypro [Oxaprozin] Rash    Rash  . Phytonadione Rash  . Sulfonamide Derivatives Rash   Prior to Admission medications   Medication Sig Start Date End Date Taking? Authorizing Provider  ALPRAZolam [name]) 0.5 MG tablet Take 0.5 mg by mouth 2 (two) times daily as needed for sleep. Every evening and sometimes during the day if needed  06/07/12  Yes [provider]  aspirin 81 MG tablet Take 81 mg by mouth daily at 6 PM.    Yes [provider]  Calcium Carbonate-Vitamin D (CALCIUM + D PO) Take 600 mg by mouth daily.    Yes [provider]  fluticasone (FLONASE) 50 MCG/ACT nasal spray Place 2 sprays into the nose daily as needed for allergies.  10/16/14  Yes [provider]  HYDROcodone-acetaminophen (NORCO/VICODIN) 5-325 MG per tablet Take 1 tablet by mouth at bedtime.    Yes [provider]  levothyroxine (SYNTHROID, LEVOTHROID) 125 MCG tablet daily. 06/24/16  Yes [provider]  Magnesium 400 MG TABS Take 400 mg by mouth daily.   Yes [provider]  metoprolol succinate (TOPROL-XL) 25 MG 24 hr tablet Take 12.5 mg by mouth 2 (two) times daily.    Yes [provider]  omeprazole (PRILOSEC) 20 MG capsule Take 20 mg by mouth daily.  02/06/13  Yes [provider]  pravastatin (PRAVACHOL) 20 MG tablet Take 20 mg by mouth daily.   Yes Christain Sacramento, MD  warfarin (COUMADIN) 5 MG tablet Take 1 & 1/2 tablet by mouth = 7.5 mg daily EXCEPT take 1 tab = 5mg  on Wednesdays and Fridays 01/13/17  Yes Annia Belt, MD   Past Medical History:  Diagnosis Date  .  Anxiety    severe panic attacks  . Arthritis   . Aseptic meningitis   . Asthma    as child  . Breast cancer ([place])    right  . Chronic anticoagulation 08/04/2015  . Collapsed lung    hx of, left  . Complication of anesthesia    "hard to put asleep"  . Depression   . Fibromyalgia   . GERD (gastroesophageal reflux disease)   . H/O hiatal hernia   . Headache(784.0)   . Hepatitis 1990   "from eating at restaurant"  . History of antiphospholipid antibody syndrome   . 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  . Hx-TIA (transient ischemic attack)   . Hyperlipidemia   . Hypertension   . Hypothyroidism   . Neuromuscular disorder ([place])   . Pneumonia    hx of  . Stroke ([place])    . Thrombosis, upper extremity artery ([place]) 04/17/2012   Left digital artery  October 1998 - new dx antiphospholipid antibody syndrome   Social History   Social History  . Marital status: Single    Spouse name: N/A  . Number of children: 2  . Years of education: 41   Social History Main Topics  . Smoking status: Never Smoker  . Smokeless tobacco: Never Used  . Alcohol use No  . Drug use: No  . Sexual activity: Not on file   Other Topics Concern  . Not on file   Social History Narrative   Lives at home with her mother.  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   Right-handed.   2-4 cups caffeine daily.      Family History  Problem Relation Age of Onset  . Alzheimer's disease Mother   . Heart disease Mother   . Hyperlipidemia Mother   . Hypertension Mother   . Cancer Maternal Grandmother        colon  . Heart disease Sister   . Hypertension Sister   . Heart attack Sister   . Heart disease Brother   . Heart attack Brother   . Cancer Daughter   . Hypertension Daughter   . Hypertension Son     ASSESSMENT Recent Results: The most recent result is correlated with 42.5 mg per week: Lab Results  Component Value Date   INR 2.90 03/07/2017   INR 1.90 02/14/2017   INR 1.4 02/09/2017   PROTIME 30.0 (H) 03/13/2015    Anticoagulation Dosing: INR as of 03/07/2017 and Previous Warfarin Dosing Information    INR Dt INR Goal Wkly Tot Sun Mon Tue Wed Thu Fri Sat   03/07/2017 2.90 2.0-3.0 42.5 mg 5 mg 7.5 mg 5 mg 7.5 mg 5 mg 7.5 mg 5 mg    Previous description   [name] (route warfarin notes and list as authorizing provider for LOS & Follow-up, lab orders and warfarin prescriptions), do not list attending physician.  Patient instructed to take  1 & 1/2 tablets of your 5mg  peach-colored warfarin tablets on Mondays, Wednesdays and Fridays; all other days--take only ONE (1) tablet.    Anticoagulation Warfarin Dose Instructions as of 03/07/2017       Total Sun Mon Tue Wed Thu Fri Sat   New Dose 42.5 mg 5 mg 7.5 mg 5 mg 7.5 mg 5 mg 7.5 mg 5 mg     (5 mg x 1)  (5 mg x 1.5)  (5 mg x 1)  (5 mg x 1.5)  (  5 mg x 1)  (5 mg x 1.5)  (5 mg x 1)                         Description   DR [id number] PATIENT (route warfarin notes and list as authorizing provider for LOS & Follow-up, lab orders and warfarin prescriptions), do not list attending physician.  Patient instructed to take  1 & 1/2 tablets of your 5mg  peach-colored warfarin tablets on Wednesdays and Fridays; all other days--take only ONE (1) tablet.      INR today: Therapeutic  PLAN Weekly dose was decreased by 6% to 40 mg per week  Patient Instructions  Patient instructed to take medications as defined in the Anti-coagulation Track section of this encounter.  Patient instructed to take today's dose.  Patient instructed to take 1 & 1/2 tablets of your 5mg  peach-colored warfarin tablets on Wednesdays and Fridays; all other days--take only ONE (1) tablet.  Patient verbalized understanding of these instructions.     Patient advised to contact clinic or seek medical attention if signs/symptoms of bleeding or thromboembolism occur.  Patient verbalized understanding by repeating back information and was advised to contact me if further medication-related questions arise. Patient was also provided an information handout.  Follow-up Return in 4 weeks (on 04/04/2017) for Follow up INR at 1000h.  Caryl Bis PharmD, CACP, CPP  15 minutes spent face-to-face with the patient during the encounter. 50% of time spent on education. 50% of time was spent on fingerstick point of care INR sample collection, processing, results interpretation and dose response, data-entry in to EPIC, [place] and [email or web address]

## 2017-03-07 NOTE — Patient Instructions (Signed)
Patient instructed to take medications as defined in the Anti-coagulation Track section of this encounter.  Patient instructed to take today's dose.  Patient instructed to take 1 & 1/2 tablets of your 5mg  peach-colored warfarin tablets on Wednesdays and Fridays; all other days--take only ONE (1) tablet.  Patient verbalized understanding of these instructions.

## 2017-04-04 ENCOUNTER — Ambulatory Visit: Payer: Medicare HMO

## 2017-04-11 ENCOUNTER — Other Ambulatory Visit: Payer: Self-pay | Admitting: Family Medicine

## 2017-04-11 DIAGNOSIS — N63 Unspecified lump in unspecified breast: Secondary | ICD-10-CM

## 2017-04-15 ENCOUNTER — Ambulatory Visit
Admission: RE | Admit: 2017-04-15 | Discharge: 2017-04-15 | Disposition: A | Discharge status: Home / Self Care | Payer: Medicare HMO | Source: Ambulatory Visit | Attending: Family Medicine | Admitting: Family Medicine

## 2017-04-15 DIAGNOSIS — N63 Unspecified lump in unspecified breast: Secondary | ICD-10-CM

## 2017-04-18 ENCOUNTER — Other Ambulatory Visit: Payer: Self-pay | Admitting: Oncology

## 2017-04-18 DIAGNOSIS — D6861 Antiphospholipid syndrome: Secondary | ICD-10-CM

## 2017-04-18 DIAGNOSIS — Z86718 Personal history of other venous thrombosis and embolism: Secondary | ICD-10-CM

## 2017-04-18 DIAGNOSIS — Z7901 Long term (current) use of anticoagulants: Secondary | ICD-10-CM

## 2017-04-25 ENCOUNTER — Ambulatory Visit (INDEPENDENT_AMBULATORY_CARE_PROVIDER_SITE_OTHER): Payer: Medicare HMO | Admitting: Pharmacist

## 2017-04-25 DIAGNOSIS — R760 Raised antibody titer: Secondary | ICD-10-CM | POA: Diagnosis not present

## 2017-04-25 DIAGNOSIS — Z7901 Long term (current) use of anticoagulants: Secondary | ICD-10-CM

## 2017-04-25 DIAGNOSIS — D6861 Antiphospholipid syndrome: Secondary | ICD-10-CM

## 2017-04-25 LAB — POCT INR: INR: 2.3

## 2017-04-25 NOTE — Progress Notes (Signed)
Anticoagulation Management [name] is a 71 y.o. female who reports to the clinic for monitoring of warfarin treatment.    Indication: Antiphospholipid antibody syndrome ([facility]) [D68.61], Lupus anticoagulant positive [R76.0], long term use of anticoagulants. Duration: indefinite Supervising physician: Murriel Hopper  Anticoagulation Clinic Visit History: Patient does not report signs/symptoms of bleeding or thromboembolism  Other recent changes: No diet, medications, lifestyle changes endorsed by the patient to me.  Anticoagulation Episode Summary    Current INR goal:   2.0-3.0  TTR:   67.8 % (5.8 y)  Next INR check:   05/23/2017  INR from last check:   2.30 (04/25/2017)  Weekly max warfarin dose:     Target end date:     INR check location:     Preferred lab:     Send INR reminders to:   [facility]   Indications   Antiphospholipid antibody syndrome ([place]) [D68.61] Lupus anticoagulant positive [R76.0]       Comments:         Anticoagulation Care Providers    Provider Role Specialty Phone number   Annia Belt, MD Referring Oncology [phone]      Allergies  Allergen Reactions  . Bee Venom Anaphylaxis  . Hornet Venom Anaphylaxis    Anaphylaxis Shock SHOCK  . Reglan [Metoclopramide] Shortness Of Breath and Other (See Comments)    Tongue got numb; didn't feel good  . Vitamin K Anaphylaxis and Rash    "kills me" iv  . Benadryl [Diphenhydramine Hcl]     Rash  . Crestor [Rosuvastatin Calcium]     Muscle Aches  . Doxycycline     Tongue burning, thrush  . Levofloxacin     Rash  . Zocor [Simvastatin]     Muscle Aches  . Ciprofloxacin Rash  . Daypro [Oxaprozin] Rash    Rash  . Phytonadione Rash  . Sulfonamide Derivatives Rash   Prior to Admission medications   Medication Sig Start Date End Date Taking? Authorizing Provider  ALPRAZolam [name]) 0.5 MG tablet Take 0.5 mg by mouth 2 (two) times daily as needed for sleep. Every evening and  sometimes during the day if needed 06/07/12  Yes [provider]  aspirin 81 MG tablet Take 81 mg by mouth daily at 6 PM.    Yes [provider]  Calcium Carbonate-Vitamin D (CALCIUM + D PO) Take 600 mg by mouth daily.    Yes [provider]  fluticasone (FLONASE) 50 MCG/ACT nasal spray Place 2 sprays into the nose daily as needed for allergies.  10/16/14  Yes [provider]  HYDROcodone-acetaminophen (NORCO/VICODIN) 5-325 MG per tablet Take 1 tablet by mouth at bedtime.    Yes [provider]  levothyroxine (SYNTHROID, LEVOTHROID) 125 MCG tablet daily. 06/24/16  Yes [provider]  Magnesium 400 MG TABS Take 400 mg by mouth daily.   Yes [provider]  metoprolol succinate (TOPROL-XL) 25 MG 24 hr tablet Take 12.5 mg by mouth 2 (two) times daily.    Yes [provider]  omeprazole (PRILOSEC) 20 MG capsule Take 20 mg by mouth daily.  02/06/13  Yes [provider]  pravastatin (PRAVACHOL) 20 MG tablet Take 20 mg by mouth daily.   Yes Christain Sacramento, MD  warfarin (COUMADIN) 5 MG tablet Take 1 & 1/2 tablet by mouth = 7.5 mg daily EXCEPT take 1 tab = 5mg  on Wednesdays and Fridays 01/13/17  Yes Annia Belt, MD   Past Medical History:  Diagnosis Date  . Anxiety    severe panic attacks  . Arthritis   . Aseptic meningitis   . Asthma    as child  . Breast cancer ([place])    right  . Chronic anticoagulation 08/04/2015  . Collapsed lung    hx of, left  . Complication of anesthesia    "hard to put asleep"  . Depression   . Fibromyalgia   . GERD (gastroesophageal reflux disease)   . H/O hiatal hernia   . Headache(784.0)   . Hepatitis 1990   "from eating at restaurant"  . History of antiphospholipid antibody syndrome   . 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  . Hx-TIA (transient ischemic attack)   . Hyperlipidemia   . Hypertension   . Hypothyroidism   . Neuromuscular disorder ([place])   .  Pneumonia    hx of  . Stroke ([place])   . 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 Marital status: Single    Spouse name: N/A  . Number of children: 2  . Years of education: 43   Social History Main Topics  . Smoking status: Never Smoker  . Smokeless tobacco: Never Used  . Alcohol use No  . Drug use: No  . Sexual activity: Not on file   Other Topics Concern  . Not on file   Social History Narrative   Lives at home with her mother.  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   Right-handed.   2-4 cups caffeine daily.      Family History  Problem Relation Age of Onset  . Alzheimer's disease Mother   . Heart disease Mother   . Hyperlipidemia Mother   . Hypertension Mother   . Cancer Maternal Grandmother        colon  . Heart disease Sister   . Hypertension Sister   . Heart attack Sister   . Heart disease Brother   . Heart attack Brother   . Cancer Daughter   . Hypertension Daughter   . Hypertension Son     ASSESSMENT Recent Results: The most recent result is correlated with 35 mg per week: Lab Results  Component Value Date   INR 2.30 04/25/2017   INR 2.90 03/07/2017   INR 1.90 02/14/2017   PROTIME 30.0 (H) 03/13/2015    Anticoagulation Dosing: INR as of 04/25/2017 and Previous Warfarin Dosing Information    INR Dt INR Goal [name] Sun Mon Tue Wed Thu Fri Sat   04/25/2017 2.30 2.0-3.0 35 mg 5 mg 5 mg 5 mg 5 mg 5 mg 5 mg 5 mg   Patient deviated from recommended dosing.       Previous description   [name] (route warfarin notes and list as authorizing provider for LOS & Follow-up, lab orders and warfarin prescriptions), do not list attending physician.  Patient instructed to take  1 & 1/2 tablets of your 5mg  peach-colored warfarin tablets on Wednesdays and Fridays; all other days--take only ONE (1) tablet.     Anticoagulation Warfarin Dose Instructions as of 04/25/2017      Total Sun Mon Tue Wed Thu Fri Sat   New Dose 42.5 mg 5 mg 7.5 mg 5 mg 7.5 mg 5 mg 7.5 mg 5 mg     (5 mg x 1)  (5  mg x 1.5)  (5 mg x 1)  (5 mg x 1.5)  (5 mg x 1)  (5 mg x 1.5)  (5 mg x 1)                         Description   DR [id number] PATIENT (route warfarin notes and list as authorizing provider for LOS & Follow-up, lab orders and warfarin prescriptions), do not list attending physician.  Take one (1) tablet of your 5mg  peach-colored warfarin tablets by mouth, once-daily, at [facility] each day.      INR today: Therapeutic  PLAN Weekly dose was unchanged .  Patient Instructions  Patient instructed to take medications as defined in the Anti-coagulation Track section of this encounter.  Patient instructed to take today's dose.  Patient instructed to take one (1) tablet of your 5mg  peach-colored warfarin tablets by mouth, once-daily, at [facility] each day.  Patient verbalized understanding of these instructions.     Patient advised to contact clinic or seek medical attention if signs/symptoms of bleeding or thromboembolism occur.  Patient verbalized understanding by repeating back information and was advised to contact me if further medication-related questions arise. Patient was also provided an information handout.  Follow-up Return in 4 weeks (on 05/23/2017) for Follow up INR at 1100h.  Pennie Banter, PharmD, CACP, CPP  15 minutes spent face-to-face with the patient during the encounter. 50% of time spent on education. 50% of time was spent on fingerstick point of care INR sample collection, sample processing, results determination and documentation in [email or web address].

## 2017-04-25 NOTE — Patient Instructions (Signed)
Patient instructed to take medications as defined in the Anti-coagulation Track section of this encounter.  Patient instructed to take today's dose.  Patient instructed to take one (1) tablet of your 5mg  peach-colored warfarin tablets by mouth, once-daily, at [facility] each day.  Patient verbalized understanding of these instructions.

## 2017-04-25 NOTE — Progress Notes (Signed)
Reviewed thx DrG 

## 2017-05-23 ENCOUNTER — Ambulatory Visit (INDEPENDENT_AMBULATORY_CARE_PROVIDER_SITE_OTHER): Payer: Medicare HMO | Admitting: Pharmacist

## 2017-05-23 ENCOUNTER — Encounter (INDEPENDENT_AMBULATORY_CARE_PROVIDER_SITE_OTHER): Payer: Self-pay

## 2017-05-23 ENCOUNTER — Other Ambulatory Visit: Payer: Self-pay | Admitting: *Deleted

## 2017-05-23 ENCOUNTER — Other Ambulatory Visit (INDEPENDENT_AMBULATORY_CARE_PROVIDER_SITE_OTHER): Payer: Medicare HMO

## 2017-05-23 DIAGNOSIS — Z7901 Long term (current) use of anticoagulants: Secondary | ICD-10-CM

## 2017-05-23 DIAGNOSIS — R760 Raised antibody titer: Secondary | ICD-10-CM

## 2017-05-23 DIAGNOSIS — D6861 Antiphospholipid syndrome: Secondary | ICD-10-CM

## 2017-05-23 DIAGNOSIS — D6862 Lupus anticoagulant syndrome: Secondary | ICD-10-CM

## 2017-05-23 DIAGNOSIS — Z86718 Personal history of other venous thrombosis and embolism: Secondary | ICD-10-CM

## 2017-05-23 LAB — POCT INR: INR: 2.5

## 2017-05-23 NOTE — Progress Notes (Signed)
Anticoagulation Management [name] is a 71 y.o. female who reports to the clinic for monitoring of warfarin treatment.    Indication: Antiphospholipid antibody syndrome, lupus anticoagulant positive, long term use of oral anticoagulants. Duration: indefinite Supervising physician: Murriel Hopper  Anticoagulation Clinic Visit History: Patient does not report signs/symptoms of bleeding or thromboembolism  Other recent changes: No diet, medications, lifestyle changes endorsed by the patient to me.  Anticoagulation Episode Summary    Current INR goal:   2.0-3.0  TTR:   68.3 % (5.9 y)  Next INR check:   06/20/2017  INR from last check:   2.50 (05/23/2017)  Weekly max warfarin dose:     Target end date:     INR check location:     Preferred lab:     Send INR reminders to:   [facility]   Indications   Antiphospholipid antibody syndrome ([place]) [D68.61] Lupus anticoagulant positive [R76.0]       Comments:         Anticoagulation Care Providers    Provider Role Specialty Phone number   Annia Belt, MD Referring Oncology [phone]      Allergies  Allergen Reactions  . Bee Venom Anaphylaxis  . Hornet Venom Anaphylaxis    Anaphylaxis Shock SHOCK  . Reglan [Metoclopramide] Shortness Of Breath and Other (See Comments)    Tongue got numb; didn't feel good  . Vitamin K Anaphylaxis and Rash    "kills me" iv  . Benadryl [Diphenhydramine Hcl]     Rash  . Crestor [Rosuvastatin Calcium]     Muscle Aches  . Doxycycline     Tongue burning, thrush  . Levofloxacin     Rash  . Zocor [Simvastatin]     Muscle Aches  . Ciprofloxacin Rash  . Daypro [Oxaprozin] Rash    Rash  . Phytonadione Rash  . Sulfonamide Derivatives Rash   Prior to Admission medications   Medication Sig Start Date End Date Taking? Authorizing Provider  ALPRAZolam [name]) 0.5 MG tablet Take 0.5 mg by mouth 2 (two) times daily as needed for sleep. Every evening and sometimes during  the day if needed 06/07/12  Yes [provider]  aspirin 81 MG tablet Take 81 mg by mouth daily at 6 PM.    Yes [provider]  Calcium Carbonate-Vitamin D (CALCIUM + D PO) Take 600 mg by mouth daily.    Yes [provider]  fluticasone (FLONASE) 50 MCG/ACT nasal spray Place 2 sprays into the nose daily as needed for allergies.  10/16/14  Yes [provider]  HYDROcodone-acetaminophen (NORCO/VICODIN) 5-325 MG per tablet Take 1 tablet by mouth at bedtime.    Yes [provider]  levothyroxine (SYNTHROID, LEVOTHROID) 125 MCG tablet daily. 06/24/16  Yes [provider]  Magnesium 400 MG TABS Take 400 mg by mouth daily.   Yes [provider]  metoprolol succinate (TOPROL-XL) 25 MG 24 hr tablet Take 12.5 mg by mouth 2 (two) times daily.    Yes [provider]  omeprazole (PRILOSEC) 20 MG capsule Take 20 mg by mouth daily.  02/06/13  Yes [provider]  pravastatin (PRAVACHOL) 20 MG tablet Take 20 mg by mouth daily.   Yes Christain Sacramento, MD  warfarin (COUMADIN) 5 MG tablet Take 1 & 1/2 tablet by mouth = 7.5 mg daily EXCEPT take 1 tab = 5mg  on Wednesdays and Fridays 01/13/17  Yes Annia Belt, MD   Past Medical History:  Diagnosis Date  .  Anxiety    severe panic attacks  . Arthritis   . Aseptic meningitis   . Asthma    as child  . Breast cancer ([place])    right  . Chronic anticoagulation 08/04/2015  . Collapsed lung    hx of, left  . Complication of anesthesia    "hard to put asleep"  . Depression   . Fibromyalgia   . GERD (gastroesophageal reflux disease)   . H/O hiatal hernia   . Headache(784.0)   . Hepatitis 1990   "from eating at restaurant"  . History of antiphospholipid antibody syndrome   . 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  . Hx-TIA (transient ischemic attack)   . Hyperlipidemia   . Hypertension   . Hypothyroidism   . Neuromuscular disorder ([place])   . Pneumonia    hx of   . Stroke ([place])   . 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 Marital status: Single    Spouse name: N/A  . Number of children: 2  . Years of education: 28   Social History Main Topics  . Smoking status: Never Smoker  . Smokeless tobacco: Never Used  . Alcohol use No  . Drug use: No  . Sexual activity: Not on file   Other Topics Concern  . Not on file   Social History Narrative   Lives at home with her mother.  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   Right-handed.   2-4 cups caffeine daily.      Family History  Problem Relation Age of Onset  . Alzheimer's disease Mother   . Heart disease Mother   . Hyperlipidemia Mother   . Hypertension Mother   . Cancer Maternal Grandmother        colon  . Heart disease Sister   . Hypertension Sister   . Heart attack Sister   . Heart disease Brother   . Heart attack Brother   . Cancer Daughter   . Hypertension Daughter   . Hypertension Son     ASSESSMENT Recent Results: The most recent result is correlated with 35 mg per week: Lab Results  Component Value Date   INR 2.50 05/23/2017   INR 2.30 04/25/2017   INR 2.90 03/07/2017   PROTIME 30.0 (H) 03/13/2015    Anticoagulation Dosing: INR as of 05/23/2017 and Previous Warfarin Dosing Information    INR Dt INR Goal Wkly Tot Sun Mon Tue Wed Thu Fri Sat   05/23/2017 2.50 2.0-3.0 35 mg 5 mg 5 mg 5 mg 5 mg 5 mg 5 mg 5 mg   Patient deviated from recommended dosing.       Previous description   [name] (route warfarin notes and list as authorizing provider for LOS & Follow-up, lab orders and warfarin prescriptions), do not list attending physician.  Take one (1) tablet of your 5mg  peach-colored warfarin tablets by mouth, once-daily, at [facility] each day.    Anticoagulation Warfarin Dose Instructions as of 05/23/2017       Total Sun Mon Tue Wed Thu Fri Sat   New Dose 35 mg 5 mg 5 mg 5 mg 5 mg 5 mg 5 mg 5 mg     (5 mg x 1)  (5 mg x 1)  (5 mg x 1)  (5 mg x 1)  (  5 mg x 1)  (5 mg x 1)  (5 mg x 1)                         Description   DR [id number] PATIENT (route warfarin notes and list as authorizing provider for LOS & Follow-up, lab orders and warfarin prescriptions), do not list attending physician.  Take one (1) tablet by mouth once-daily at [facility] each day.   Take one (1) tablet of your 5mg  peach-colored warfarin tablets by mouth, once-daily, at [facility] each day.      INR today: Therapeutic  PLAN Weekly dose was unchanged.  Patient Instructions  Patient instructed to take medications as defined in the Anti-coagulation Track section of this encounter.  Patient instructed to take today's dose.  Patient instructed to take one (1) tablet by mouth once-daily at [facility] each day Patient verbalized understanding of these instructions.     Patient advised to contact clinic or seek medical attention if signs/symptoms of bleeding or thromboembolism occur.  Patient verbalized understanding by repeating back information and was advised to contact me if further medication-related questions arise. Patient was also provided an information handout.  Follow-up Return in 4 weeks (on 06/20/2017) for Follow up INR at 1-45h.  Pennie Banter, PharmD, CACP, CPP  15 minutes spent face-to-face with the patient during the encounter. 50% of time spent on education. 50% of time was spent on fingerstick point of care INR sample collection, processing, results determination, and documentation in EPIC/CHL and [email or web address]

## 2017-05-23 NOTE — Patient Instructions (Signed)
Patient instructed to take medications as defined in the Anti-coagulation Track section of this encounter.  Patient instructed to take today's dose.  Patient instructed to take one (1) tablet by mouth once-daily at [facility] each day Patient verbalized understanding of these instructions.

## 2017-05-24 ENCOUNTER — Other Ambulatory Visit: Payer: Medicare HMO

## 2017-05-24 LAB — CMP14 + ANION GAP
ALT: 14 IU/L (ref 0–32)
AST: 17 IU/L (ref 0–40)
Albumin/Globulin Ratio: 2 (ref 1.2–2.2)
Albumin: 4.1 g/dL (ref 3.6–4.8)
Alkaline Phosphatase: 107 IU/L (ref 39–117)
Anion Gap: 18 mmol/L (ref 10.0–18.0)
BUN/Creatinine Ratio: 20 (ref 12–28)
BUN: 20 mg/dL (ref 8–27)
Bilirubin Total: 0.3 mg/dL (ref 0.0–1.2)
CO2: 22 mmol/L (ref 20–29)
Calcium: 8.8 mg/dL (ref 8.7–10.3)
Chloride: 103 mmol/L (ref 96–106)
Creatinine, Ser: 1.02 mg/dL — ABNORMAL HIGH (ref 0.57–1.00)
GFR calc Af Amer: 65 mL/min/{1.73_m2} (ref >59)
GFR calc non Af Amer: 56 mL/min/{1.73_m2} — ABNORMAL LOW (ref >59)
Globulin, Total: 2.1 g/dL (ref 1.5–4.5)
Glucose: 119 mg/dL — ABNORMAL HIGH (ref 65–99)
Potassium: 4.2 mmol/L (ref 3.5–5.2)
Sodium: 143 mmol/L (ref 134–144)
Total Protein: 6.2 g/dL (ref 6.0–8.5)

## 2017-05-24 LAB — CBC WITH DIFFERENTIAL/PLATELET
Basophils Absolute: 0.1 10*3/uL (ref 0.0–0.2)
Basos: 1 %
EOS (ABSOLUTE): 0.3 10*3/uL (ref 0.0–0.4)
Eos: 5 %
Hematocrit: 37.8 % (ref 34.0–46.6)
Hemoglobin: 12.6 g/dL (ref 11.1–15.9)
Immature Grans (Abs): 0 10*3/uL (ref 0.0–0.1)
Immature Granulocytes: 0 %
Lymphocytes Absolute: 2 10*3/uL (ref 0.7–3.1)
Lymphs: 34 %
MCH: 29.7 pg (ref 26.6–33.0)
MCHC: 33.3 g/dL (ref 31.5–35.7)
MCV: 89 fL (ref 79–97)
Monocytes Absolute: 0.5 10*3/uL (ref 0.1–0.9)
Monocytes: 8 %
Neutrophils Absolute: 3.1 10*3/uL (ref 1.4–7.0)
Neutrophils: 52 %
Platelets: 241 10*3/uL (ref 150–379)
RBC: 4.24 x10E6/uL (ref 3.77–5.28)
RDW: 14.5 % (ref 12.3–15.4)
WBC: 5.9 10*3/uL (ref 3.4–10.8)

## 2017-05-25 NOTE — Progress Notes (Signed)
Reviewed Thanks DrG 

## 2017-05-31 ENCOUNTER — Encounter: Payer: Medicare HMO | Admitting: Oncology

## 2017-06-14 ENCOUNTER — Ambulatory Visit (INDEPENDENT_AMBULATORY_CARE_PROVIDER_SITE_OTHER): Payer: Medicare HMO | Admitting: Oncology

## 2017-06-14 ENCOUNTER — Encounter: Payer: Self-pay | Admitting: Oncology

## 2017-06-14 ENCOUNTER — Other Ambulatory Visit: Payer: Self-pay

## 2017-06-14 VITALS — BP 136/69 | HR 58 | Temp 97.8°F | Ht 65.0 in | Wt 185.5 lb

## 2017-06-14 DIAGNOSIS — M7989 Other specified soft tissue disorders: Secondary | ICD-10-CM

## 2017-06-14 DIAGNOSIS — Z882 Allergy status to sulfonamides status: Secondary | ICD-10-CM

## 2017-06-14 DIAGNOSIS — E890 Postprocedural hypothyroidism: Secondary | ICD-10-CM | POA: Diagnosis not present

## 2017-06-14 DIAGNOSIS — K209 Esophagitis, unspecified: Secondary | ICD-10-CM

## 2017-06-14 DIAGNOSIS — Z7901 Long term (current) use of anticoagulants: Secondary | ICD-10-CM | POA: Diagnosis not present

## 2017-06-14 DIAGNOSIS — M79652 Pain in left thigh: Secondary | ICD-10-CM

## 2017-06-14 DIAGNOSIS — Z170 Estrogen receptor positive status [ER+]: Secondary | ICD-10-CM

## 2017-06-14 DIAGNOSIS — N6331 Unspecified lump in axillary tail of the right breast: Secondary | ICD-10-CM

## 2017-06-14 DIAGNOSIS — D6861 Antiphospholipid syndrome: Secondary | ICD-10-CM | POA: Diagnosis not present

## 2017-06-14 DIAGNOSIS — Z923 Personal history of irradiation: Secondary | ICD-10-CM

## 2017-06-14 DIAGNOSIS — Z8661 Personal history of infections of the central nervous system: Secondary | ICD-10-CM

## 2017-06-14 DIAGNOSIS — Z91030 Bee allergy status: Secondary | ICD-10-CM

## 2017-06-14 DIAGNOSIS — Z86711 Personal history of pulmonary embolism: Secondary | ICD-10-CM | POA: Diagnosis not present

## 2017-06-14 DIAGNOSIS — Z853 Personal history of malignant neoplasm of breast: Secondary | ICD-10-CM

## 2017-06-14 DIAGNOSIS — I742 Embolism and thrombosis of arteries of the upper extremities: Secondary | ICD-10-CM

## 2017-06-14 DIAGNOSIS — Z888 Allergy status to other drugs, medicaments and biological substances status: Secondary | ICD-10-CM

## 2017-06-14 DIAGNOSIS — Z8673 Personal history of transient ischemic attack (TIA), and cerebral infarction without residual deficits: Secondary | ICD-10-CM | POA: Diagnosis not present

## 2017-06-14 DIAGNOSIS — Z8269 Family history of other diseases of the musculoskeletal system and connective tissue: Secondary | ICD-10-CM

## 2017-06-14 DIAGNOSIS — Z818 Family history of other mental and behavioral disorders: Secondary | ICD-10-CM

## 2017-06-14 DIAGNOSIS — Z800 Family history of malignant neoplasm of digestive organs: Secondary | ICD-10-CM

## 2017-06-14 DIAGNOSIS — C50511 Malignant neoplasm of lower-outer quadrant of right female breast: Secondary | ICD-10-CM

## 2017-06-14 DIAGNOSIS — Z86718 Personal history of other venous thrombosis and embolism: Secondary | ICD-10-CM | POA: Diagnosis not present

## 2017-06-14 DIAGNOSIS — Z881 Allergy status to other antibiotic agents status: Secondary | ICD-10-CM

## 2017-06-14 HISTORY — DX: Other specified soft tissue disorders: M79.89

## 2017-06-14 NOTE — Patient Instructions (Signed)
Schedule CT chest: attention to right posterior axilla We will call you with result and determine if you need a biopsy If scan negative, we will see you back in 3 months to see if any change in your exam

## 2017-06-14 NOTE — Progress Notes (Signed)
Hematology and Oncology Follow Up Visit  [name] [id number] 06-28-1946 70 y.o. 06/14/2017 11:09 AM   Principle Diagnosis: Encounter Diagnoses  Name Primary?  . Thrombosis, upper extremity artery ([place])   . Antiphospholipid antibody syndrome ([place])   . Malignant neoplasm of lower-outer quadrant of right breast of female, estrogen receptor positive ([place])   . Chronic anticoagulation   . Pain of left thigh   . History of DVT (deep vein thrombosis)   . Hx-TIA (transient ischemic attack)   . Soft tissue mass Yes  Updated clinical summary: 71 year old woman who has a history of an arterial embolus to one of the fingers of her left hand in October 1998 as the first sign of antiphospholipid antibody syndrome. She has been on chronic Coumadin anticoagulation with no subsequent thrombotic events.   She developed a stage I, ER-positive cancer of the right breast in December 1999, treated with lumpectomy, radiation, and then hormonal therapy with tamoxifen. She developed a large hematoma at the lumpectomy site which then organized into dense tissue which had to be excised. She still has a dense scar in the right breast.   She had an upper endoscopy and colonoscopy by Dr. Juanita Craver on 03/09/2011. There was some question of Barrett esophagus in the past, reported by another gastroenterologist. However, Dr. Collene Mares did not find any evidence of this on her study . There were changes from prior Nissen fundoplication. There was a questionable small patch of Barrett esophagus above the Z-line which was biopsied. Biopsies returned showing focal mild active esophagitis but no evidence for Barrett esophagus. She was started on an antireflux regimen. Unfortunately, the previous Nissen fundoplication broke down and she required a repair procedure on 12/12/2012 by Dr. Johnathan Hausen.   She was hospitalized for an acute neurologic change on 04/13/2016 and I saw her when she was in the hospital. She presented  with sudden onset of left-sided numbness and blurred vision in her right eye. She described her visual changes triple vision. She had transient dysarthria. 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 She had no fever, headache, neck pain, or leukocytosis. In view of the atypical nature of the lesions and her history of breast cancer, a lumbar puncture was performed. Fluid was clear, 12 white cells, 98% lymphocytes, protein 40, glucose 74, cultures remain sterile. No malignant cells seen on cytology. No oligoclonal bands were seen. Lyme antibodies not detected. Neurology recommended a empiric trial of high-dose steroids. This appeared most likely to be a viral meningitis although with atypical features especially the absence of headache. Atypical vasculitis also in the differential. She had a complete neurologic recovery.   Interim History: Extended visit.  She has multiple complaints today.  Intermittent pain left side of her neck.  Intermittent pain right medial thigh.  Intermittent paresthesias of her hands.  Palpable lump in her right axilla.  Labs done in anticipation of today's visit all within normal range.  Most recent mammogram done September 21 with tomography technique showed no obvious abnormalities.  Physical exam at time of mammogram with "firm ridges of tissue in the lateral aspect of the right axilla with no discrete palpable mass".  Ultrasound done in addition to the mammogram showed normal fibroglandular tissue and no suspicious masses. She has been under a lot of stress.  Sister not  doing well with metastatic colon cancer.  She is taking care of her mother with advanced Alzheimer's.  Her son age 53 just had to have extensive surgery on the bones of his neck due to impending cord  compression from what sounds like progression of osteogenesis imperfecta.  Medications: reviewed  Allergies:  Allergies  Allergen Reactions  . Bee Venom Anaphylaxis  . Hornet Venom Anaphylaxis    Anaphylaxis Shock SHOCK  . Reglan [Metoclopramide] Shortness Of Breath and Other (See Comments)    Tongue got numb; didn't feel good  . Vitamin K Anaphylaxis and Rash    "kills me" iv  . Benadryl [Diphenhydramine Hcl]     Rash  . Crestor [Rosuvastatin Calcium]     Muscle Aches  . Doxycycline     Tongue burning, thrush  . Levofloxacin     Rash  . Zocor [Simvastatin]     Muscle Aches  . Ciprofloxacin Rash  . Daypro [Oxaprozin] Rash    Rash  . Phytonadione Rash  . Sulfonamide Derivatives Rash    Review of Systems: See interim history Remaining ROS negative:   Physical Exam: Blood pressure 136/69, pulse (!) 58, temperature 97.8 F (36.6 C), height 5\' 5"  (1.651 m), weight 185 lb 8 oz (84.1 kg), SpO2 100 %. Wt Readings from Last 3 Encounters:  06/14/17 185 lb 8 oz (84.1 kg)  11/26/16 177 lb (80.3 kg)  10/07/16 177 lb (80.3 kg)     General appearance: Well-nourished Caucasian woman HENNT: Pharynx no erythema, exudate, mass, or ulcer. No thyromegaly or thyroid nodules.  Midline scar from previous thyroidectomy.  No tenderness on palpation of the neck on either side. Lymph nodes: No cervical, supraclavicular, or left axillary lymphadenopathy.  See breast exam below Breasts: Surgical changes right breast and scar upper inner quadrant.  Approximate 3 cm firm nodular movable soft tissue mass right posterior axillary line along lateral border of the pectoralis major muscle Lungs: Clear to auscultation, resonant to percussion throughout Heart: Regular rhythm, no murmur, no gallop, no rub, no click, no edema Abdomen: Soft, nontender, vertical midline scar from surgery done in her 30s, normal bowel sounds, no mass, no organomegaly Extremities: No edema, no calf tenderness, no  cyanosis Musculoskeletal: no joint deformities.  Minimal tenderness on palpation left femoral canal GU:  Vascular: Carotid pulses 2+, no bruits, radial pulses 2+ symmetric, ulnar pulses 1+ symmetric, distal pulses: Dorsalis pedis 1+ symmetric Neurologic: Alert, oriented, PERRLA, optic discs sharp and vessels normal, no hemorrhage or exudate, cranial nerves grossly normal, motor strength 5 over 5, reflexes 1+ symmetric, upper body coordination normal, gait normal, Skin: No rash or ecchymosis  Lab Results: CBC W/Diff    Component Value Date/Time   WBC 5.9 05/23/2017 1121   WBC 8.2 08/02/2016 1445   RBC 4.24 05/23/2017 1121   RBC 4.07 08/02/2016 1445   HGB 12.6 05/23/2017 1121   HGB 13.0 02/12/2015 1122   HCT 37.8 05/23/2017 1121   HCT 38.8 02/12/2015 1122   PLT 241 05/23/2017 1121   MCV 89 05/23/2017 1121   MCV 92.8 02/12/2015 1122   MCH 29.7 05/23/2017 1121   MCH 31.2 08/02/2016 1445   MCHC 33.3 05/23/2017 1121   MCHC 33.8 08/02/2016 1445   RDW 14.5 05/23/2017 1121   RDW 14.0 02/12/2015 1122   LYMPHSABS 2.0 05/23/2017 1121   LYMPHSABS 2.9 02/12/2015 1122   MONOABS 0.6 08/02/2016 1445   MONOABS 0.6 02/12/2015 1122   EOSABS 0.3 05/23/2017 1121   BASOSABS 0.1 05/23/2017 1121  BASOSABS 0.1 02/12/2015 1122     Chemistry      Component Value Date/Time   NA 143 05/23/2017 1121   NA 140 04/09/2014 1513   K 4.2 05/23/2017 1121   K 3.9 04/09/2014 1513   CL 103 05/23/2017 1121   CL 102 10/19/2012 1301   CO2 22 05/23/2017 1121   CO2 24 04/09/2014 1513   BUN 20 05/23/2017 1121   BUN 16.8 04/09/2014 1513   CREATININE 1.02 (H) 05/23/2017 1121   CREATININE 1.4 (H) 04/09/2014 1513      Component Value Date/Time   CALCIUM 8.8 05/23/2017 1121   CALCIUM 8.7 04/09/2014 1513   ALKPHOS 107 05/23/2017 1121   ALKPHOS 101 04/09/2014 1513   AST 17 05/23/2017 1121   AST 15 04/09/2014 1513   ALT 14 05/23/2017 1121   ALT 13 04/09/2014 1513   BILITOT 0.3 05/23/2017 1121   BILITOT 0.41  04/09/2014 1513       Radiological Studies: No results found.  Impression:  1.  Soft tissue mass skin folds of the lateral pectoralis major on ipsilateral side of previous breast cancer.  Not seen as a definite mass on breast tomograms or ultrasound done in September. I am going to get a CT scan with attention to the right axilla for further evaluation.  Biopsy if indicated based on results.  2.  History of arterial embolus to a digital artery left hand.  No recurrent thrombotic events on long-term warfarin anticoagulation.  3.  Antiphospholipid antibody syndrome.  4.  History of treated stage I ER positive cancer of the right breast now out almost 19 years.  No gross evidence for recurrent disease but evaluation will be done to evaluate a palpable area of soft tissue abnormality in the right axilla.  See #1 above.  5.  History of Nissen fundoplication for hiatus hernia  6.  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  If symptoms persist or progress, I would like to get a neurology reevaluation. There is a questionable history of fibromyalgia and she is under a lot of personal stress which may be related to some of her symptoms but this remains a diagnosis of exclusion.  CC: Patient Care Team: Amie Critchley as PCP - General (Family Medicine)   Murriel Hopper, MD, [place]  Hematology-Oncology/Internal Medicine     11/20/201811:09 AM

## 2017-06-20 ENCOUNTER — Ambulatory Visit (INDEPENDENT_AMBULATORY_CARE_PROVIDER_SITE_OTHER): Payer: Medicare HMO | Admitting: Pharmacist

## 2017-06-20 DIAGNOSIS — D6861 Antiphospholipid syndrome: Secondary | ICD-10-CM | POA: Diagnosis not present

## 2017-06-20 DIAGNOSIS — R760 Raised antibody titer: Secondary | ICD-10-CM

## 2017-06-20 DIAGNOSIS — Z7901 Long term (current) use of anticoagulants: Secondary | ICD-10-CM

## 2017-06-20 DIAGNOSIS — I742 Embolism and thrombosis of arteries of the upper extremities: Secondary | ICD-10-CM

## 2017-06-20 MED ORDER — WARFARIN SODIUM 5 MG PO TABS: 5.0000 mg | ORAL_TABLET | Freq: Every day | ORAL | 3 refills | Status: DC

## 2017-06-22 NOTE — Progress Notes (Signed)
Anticoagulation Management [name] is a 71 y.o. female who reports to the clinic for monitoring of warfarin treatment.    Indication: Antiphospholipid antibody syndrome, lupus anticoagulant positive, long term use of oral anticoagulants. Duration: indefinite Supervising physician: Murriel Hopper  Anticoagulation Clinic Visit History: Patient does not report signs/symptoms of bleeding or thromboembolism  Other recent changes: No diet, medications, lifestyle changes endorsed by the patient to me.   Anticoagulation Episode Summary    Current INR goal:   2.0-3.0  TTR:   68.3 % (5.9 y)  Next INR check:   07/11/2017  INR from last check:   2.50 (05/23/2017)  Weekly max warfarin dose:     Target end date:     INR check location:     Preferred lab:     Send INR reminders to:   [facility]   Indications   Antiphospholipid antibody syndrome ([place]) [D68.61] Lupus anticoagulant positive [R76.0]       Comments:         Anticoagulation Care Providers    Provider Role Specialty Phone number   Annia Belt, MD Referring Oncology [phone]      Allergies  Allergen Reactions  . Bee Venom Anaphylaxis  . Hornet Venom Anaphylaxis    Anaphylaxis Shock SHOCK  . Reglan [Metoclopramide] Shortness Of Breath and Other (See Comments)    Tongue got numb; didn't feel good  . Vitamin K Anaphylaxis and Rash    "kills me" iv  . Benadryl [Diphenhydramine Hcl]     Rash  . Crestor [Rosuvastatin Calcium]     Muscle Aches  . Doxycycline     Tongue burning, thrush  . Levofloxacin     Rash  . Zocor [Simvastatin]     Muscle Aches  . Ciprofloxacin Rash  . Daypro [Oxaprozin] Rash    Rash  . Phytonadione Rash  . Sulfonamide Derivatives Rash   Medication Sig  ALPRAZolam (XANAX) 0.5 MG tablet Take 0.5 mg by mouth 2 (two) times daily as needed for sleep. Every evening and sometimes during the day if needed  aspirin 81 MG tablet Take 81 mg by mouth daily at 6 PM.    Calcium Carbonate-Vitamin D (CALCIUM + D PO) Take 600 mg by mouth daily.   fluticasone (FLONASE) 50 MCG/ACT nasal spray Place 2 sprays into the nose daily as needed for allergies.   HYDROcodone-acetaminophen (NORCO/VICODIN) 5-325 MG per tablet Take 1 tablet by mouth at bedtime.   levothyroxine (SYNTHROID, LEVOTHROID) 125 MCG tablet daily.  Magnesium 400 MG TABS Take 400 mg by mouth daily.  metoprolol succinate (TOPROL-XL) 25 MG 24 hr tablet Take 12.5 mg by mouth 2 (two) times daily.   omeprazole (PRILOSEC) 20 MG capsule Take 20 mg by mouth daily.   pravastatin (PRAVACHOL) 20 MG tablet Take 20 mg by mouth daily.  warfarin (COUMADIN) 5 MG tablet Take 1 tablet (5 mg total) by mouth daily for 1 dose.   Past Medical History:  Diagnosis Date  . Anxiety    severe panic attacks  . Arthritis   . Aseptic meningitis   . Asthma    as child  . Breast cancer ([place])    right  . Chronic anticoagulation 08/04/2015  . Collapsed lung    hx of, left  . Complication of anesthesia    "hard to put asleep"  . Depression   . Fibromyalgia   . GERD (gastroesophageal reflux disease)   . H/O hiatal hernia   . Headache(784.0)   .  Hepatitis 1990   "from eating at restaurant"  . History of antiphospholipid antibody syndrome   . 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  . Hx-TIA (transient ischemic attack)   . Hyperlipidemia   . Hypertension   . Hypothyroidism   . Neuromuscular disorder ([place])   . Pneumonia    hx of  . Soft tissue mass 06/14/2017   3 cm right post axilla same side as previous breast cancer 06/14/17  . Stroke ([place])   . 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 Marital status: Single    Spouse name: Not on file  . Number of children: 2  . Years of education: 7  . Highest education level: Not on file  Social Needs  . Financial  resource strain: Not on file  . Food insecurity - worry: Not on file  . Food insecurity - inability: Not on file  . Transportation needs - medical: Not on file  . Transportation needs - non-medical: Not on file  Occupational History  . Not on file  Tobacco Use  . Smoking status: Never Smoker  . Smokeless tobacco: Never Used  Substance and Sexual Activity  . Alcohol use: No    Alcohol/week: 0.0 oz  . Drug use: No  . Sexual activity: Not on file  Other Topics Concern  . Not on file  Social History Narrative   Lives at home with her mother.  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   Right-handed.   2-4 cups caffeine daily.   Family History  Problem Relation Age of Onset  . Alzheimer's disease Mother   . Heart disease Mother   . Hyperlipidemia Mother   . Hypertension Mother   . Cancer Maternal Grandmother        colon  . Heart disease Sister   . Hypertension Sister   . Heart attack Sister   . Heart disease Brother   . Heart attack Brother   . Cancer Daughter   . Hypertension Daughter   . Hypertension Son    ASSESSMENT Lab Results  Component Value Date   INR 2.50 05/23/2017   INR 2.30 04/25/2017   INR 2.90 03/07/2017   PROTIME 30.0 (H) 03/13/2015   Anticoagulation Dosing: Description   [name] (route warfarin notes and list as authorizing provider for LOS & Follow-up, lab orders and warfarin prescriptions), do not list attending physician.  Take one (1) tablet by mouth once-daily at [facility] each day.   Take one (1) tablet of your 5mg  peach-colored warfarin tablets by mouth, once-daily, at [facility] each day.      INR today: Therapeutic  PLAN Weekly dose was unchanged   There are no Patient Instructions on file for this visit. Patient advised to contact clinic or seek medical attention if signs/symptoms of bleeding or thromboembolism occur.  Patient verbalized understanding by repeating back information and was advised to contact me if  further medication-related questions arise. Patient was also provided an information handout.  Follow-up Return in about 3 weeks (around 07/11/2017).  [name]

## 2017-06-22 NOTE — Progress Notes (Signed)
Reviewed thx DrG 

## 2017-06-24 NOTE — Addendum Note (Signed)
Addended by: Orson Gear on: 06/24/2017 11:53 AM   Modules accepted: Orders

## 2017-06-28 ENCOUNTER — Encounter: Payer: Self-pay | Admitting: Neurology

## 2017-06-28 ENCOUNTER — Telehealth: Payer: Self-pay | Admitting: *Deleted

## 2017-06-28 ENCOUNTER — Ambulatory Visit (INDEPENDENT_AMBULATORY_CARE_PROVIDER_SITE_OTHER): Payer: Medicare HMO | Admitting: Neurology

## 2017-06-28 VITALS — BP 141/83 | HR 80 | Ht 65.0 in | Wt 184.0 lb

## 2017-06-28 DIAGNOSIS — R760 Raised antibody titer: Secondary | ICD-10-CM | POA: Diagnosis not present

## 2017-06-28 DIAGNOSIS — G8929 Other chronic pain: Secondary | ICD-10-CM

## 2017-06-28 DIAGNOSIS — D6861 Antiphospholipid syndrome: Secondary | ICD-10-CM

## 2017-06-28 DIAGNOSIS — M79652 Pain in left thigh: Secondary | ICD-10-CM

## 2017-06-28 DIAGNOSIS — R51 Headache: Secondary | ICD-10-CM

## 2017-06-28 DIAGNOSIS — R930 Abnormal findings on diagnostic imaging of skull and head, not elsewhere classified: Secondary | ICD-10-CM | POA: Diagnosis not present

## 2017-06-28 DIAGNOSIS — R519 Headache, unspecified: Secondary | ICD-10-CM

## 2017-06-28 MED ORDER — DULOXETINE HCL 60 MG PO CPEP: 60.0000 mg | ORAL_CAPSULE | Freq: Every day | ORAL | 12 refills | Status: DC

## 2017-06-28 NOTE — Progress Notes (Signed)
PATIENT: [name] DOB: 08/04/1945  Chief Complaint  Patient presents with  . Numbness    Reports numbness to be worse in her bilateral hands and feet.  [name] Headache    She has a pain in the back of her head that occurs 3-4 times weekly.     HISTORICAL  [name] is a 71 year old right-handed female, seen in refer by her primary care from [facility] [name] for evaluation of one episode of severe headache nausea or vomiting, initial evaluation was April 29 2016.  She had antiphospholipid syndrome, had history of digital blood clot, has been on chronic Coumadin treatment for many years, anxiety, hypothyroidism, on supplement, she has been on Requip 1 mg twice a day since 2014 for restless leg syndrome, with sub optimal control of her symptoms, she was recently tried on gabapentin, was not sure about the benefit, previously was treated with gabapentin up to 1800 mg a day without significant improvement, right breast cancer, s/p lobectomy, followed by chemo and radiation therapy in 2000.  She lives with her mother, who suffered severe dementia, she is the main caregiver  I reviewed and summarized hospital record, she was admitted to the hospital on April 13 2016, prior to admission, she complained few days history of headaches, had right parietal region, nausea or vomiting, also noticed multiple visions when looking to the right side, upon presentation, she also complains of left-sided numbness, INR upon presentation was 1.6, I personally reviewed  CT scan of her head showed no acute intracranial abnormality. MRI of her brain showed no signs of an acute stroke. 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  She has been afebrile, she denies stiff neck   Laboratory showed LDL 118, normal CBC, hemoglobin of 14 point  1, normal CMP, with creatinine of 1.42, A1c 6.0, most recent INR on September 29th 7017 was 2.2, heparin unfractionated 0.5, CBC showed hemoglobin 11 point 5, elevated WBC 18 point 8, C-reactive protein 1.3, ESR 11, negative RPR,  Spinal fluid testing RBC 3, WBC 12, lymphocyte 98%, monocytes 2% 0 oligoclonal banding,, total protein is 40, glucose was mildly elevated 74, angiotension converting enzyme 1.4, TSH was elevated 45, and LDL was 118, triglycerides 231, A1c 6.0,  She was evaluated by neuro hospitalists, was given Solu-Medrol 500 mg for 3 consecutive days, she did report significant improvement, she can move her eyes better, no longer has significant double vision. She no longer has significant left sided numbness, but complains of fatigue,  UPDATE Jul 06 2016: We have personally reviewed MRI of the brain with and without contrast on June 06 2016, there was resolution of enhancing perivascular foci noted on previous MRI in September 2017.  She continue have significant restless leg symptoms, at nighttime, sometimes woke up from sleep, urge to move, difficulty falling to sleep, prolonged car ride will also trigger her symptoms, she is taking gabapentin 300 mg 3 times a day, Requip 1 mg 3 times a day,  UPDATE Dec 4th 2018: She complains of occipital area pressure headache, when she turn suddenly to right side, she had transient multiple vision, she has chronic toe paresthesia, she also complains of bilateral fingers numbness, tingling for few weeks, she could not feel the paper.  She denies significant neck pain,  Right upper abdominal pain, she run into right side.  She lives with her 52  years old mother, complains of stress.  REVIEW OF SYSTEMS: Full 14 system review of systems performed and notable only for as above.  ALLERGIES: Allergies  Allergen Reactions  . Bee Venom Anaphylaxis  . Hornet Venom Anaphylaxis    Anaphylaxis Shock SHOCK  . Reglan [Metoclopramide] Shortness Of  Breath and Other (See Comments)    Tongue got numb; didn't feel good  . Vitamin K Anaphylaxis and Rash    "kills me" iv  . Benadryl [Diphenhydramine Hcl]     Rash  . Crestor [Rosuvastatin Calcium]     Muscle Aches  . Doxycycline     Tongue burning, thrush  . Levofloxacin     Rash  . Zocor [Simvastatin]     Muscle Aches  . Ciprofloxacin Rash  . Daypro [Oxaprozin] Rash    Rash  . Phytonadione Rash  . Sulfonamide Derivatives Rash    HOME MEDICATIONS: Current Outpatient Medications  Medication Sig Dispense Refill  . ALPRAZolam (XANAX) 0.5 MG tablet Take 0.5 mg by mouth 2 (two) times daily as needed for sleep. Every evening and sometimes during the day if needed    . aspirin 81 MG tablet Take 81 mg by mouth daily at 6 PM.     . Calcium Carbonate-Vitamin D (CALCIUM + D PO) Take 600 mg by mouth daily.     . fluticasone (FLONASE) 50 MCG/ACT nasal spray Place 2 sprays into the nose daily as needed for allergies.     [name] HYDROcodone-acetaminophen (NORCO/VICODIN) 5-325 MG per tablet Take 1 tablet by mouth at bedtime.     [name] levothyroxine (SYNTHROID, LEVOTHROID) 125 MCG tablet daily.    . Magnesium 400 MG TABS Take 400 mg by mouth daily.    . metoprolol succinate (TOPROL-XL) 25 MG 24 hr tablet Take 12.5 mg by mouth 2 (two) times daily.     [name] omeprazole (PRILOSEC) 20 MG capsule Take 20 mg by mouth daily.     . pravastatin (PRAVACHOL) 20 MG tablet Take 20 mg by mouth daily.    [name] warfarin (COUMADIN) 5 MG tablet Take 1 tablet (5 mg total) by mouth daily for 1 dose. 90 tablet 3   No current facility-administered medications for this visit.     PAST MEDICAL HISTORY: Past Medical History:  Diagnosis Date  . Anxiety    severe panic attacks  . Arthritis   . Aseptic meningitis   . Asthma    as child  . Breast cancer ([place])    right  . Chronic anticoagulation 08/04/2015  . Collapsed lung    hx of, left  . Complication of anesthesia    "hard to put asleep"  . Depression   . Fibromyalgia     . GERD (gastroesophageal reflux disease)   . H/O hiatal hernia   . Headache(784.0)   . Hepatitis 1990   "from eating at restaurant"  . History of antiphospholipid antibody syndrome   . 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  . Hx-TIA (transient ischemic attack)   . Hyperlipidemia   . Hypertension   . Hypothyroidism   . Neuromuscular disorder ([place])   . Pneumonia    hx of  . Soft tissue mass 06/14/2017   3 cm right post axilla same side as previous breast cancer 06/14/17  . Stroke ([place])   . Thrombosis, upper extremity artery ([place]) 04/17/2012   Left digital artery  October 1998 - new dx antiphospholipid antibody syndrome    PAST SURGICAL HISTORY: Past  Surgical History:  Procedure Laterality Date  . ABDOMINAL HYSTERECTOMY    . BREAST LUMPECTOMY Right 2000  . BREAST SURGERY Right    x3  . BUNIONECTOMY Bilateral 30 years ago  . CARDIAC CATHETERIZATION  07/12/1997   NORMAL LEFT VENTRICULAR FUNCTION WITH EF AT LEAST 70-75%  . ESOPHAGEAL MANOMETRY N/A 11/06/2012   Procedure: ESOPHAGEAL MANOMETRY (EM);  Surgeon: Pedro Earls, MD;  Location: [facility];  Service: General;  Laterality: N/A;  . ESOPHAGOGASTRODUODENOSCOPY ENDOSCOPY    . KNEE ARTHROSCOPY Right   . LAPAROSCOPIC NISSEN FUNDOPLICATION N/A 1/79/1505   Procedure: LAPAROSCOPIC TAKEDOWN  PERI-HIATAL HERNIA    REPAIR;  Surgeon: Pedro Earls, MD;  Location: [facility];  Service: General;  Laterality: N/A;  . [place]  . RIGHT OOPHORECTOMY  1980  . THYROIDECTOMY  in 20's  . TONSILLECTOMY  71 years old  . UPPER GI ENDOSCOPY N/A 12/12/2012   Procedure: UPPER GI ENDOSCOPY;  Surgeon: Pedro Earls, MD;  Location: [facility];  Service: General;  Laterality: N/A;    FAMILY HISTORY: Family History  Problem Relation Age of Onset  . Alzheimer's disease Mother   . Heart disease Mother   . Hyperlipidemia Mother   . Hypertension Mother   . Cancer Maternal Grandmother        colon  . Heart disease  Sister   . Hypertension Sister   . Heart attack Sister   . Heart disease Brother   . Heart attack Brother   . Cancer Daughter   . Hypertension Daughter   . Hypertension Son     SOCIAL HISTORY:  Social History   Socioeconomic History  . Marital status: Single    Spouse name: Not on file  . Number of children: 2  . Years of education: 10  . Highest education level: Not on file  Social Needs  . Financial resource strain: Not on file  . Food insecurity - worry: Not on file  . Food insecurity - inability: Not on file  . Transportation needs - medical: Not on file  . Transportation needs - non-medical: Not on file  Occupational History  . Not on file  Tobacco Use  . Smoking status: Never Smoker  . Smokeless tobacco: Never Used  Substance and Sexual Activity  . Alcohol use: No    Alcohol/week: 0.0 oz  . Drug use: No  . Sexual activity: Not on file  Other Topics Concern  . Not on file  Social History Narrative   Lives at home with her mother.  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   Right-handed.   2-4 cups caffeine daily.     PHYSICAL EXAM   Vitals:   06/28/17 0754  BP: (!) 141/83  Pulse: 80  Weight: 184 lb (83.5 kg)  Height: '5\' 5"'  (1.651 m)    Not recorded      Body mass index is 30.62 kg/m.  PHYSICAL EXAMNIATION:  Gen: NAD, conversant, well nourised, obese, well groomed                     Cardiovascular: Regular rate rhythm, no peripheral edema, warm, nontender. Eyes: Conjunctivae clear without exudates or hemorrhage Neck: Supple, no carotid bruise. Pulmonary: Clear to auscultation bilaterally   NEUROLOGICAL EXAM:  MENTAL STATUS: Speech:    Speech is normal; fluent and spontaneous with normal comprehension.  Cognition:     Orientation to time, place and person     Normal recent  and remote memory     Normal Attention span and concentration     Normal Language, naming, repeating,spontaneous speech     Fund of knowledge     CRANIAL NERVES: CN II: Visual fields are full to confrontation. Fundoscopic exam is normal with sharp discs and no vascular changes. Pupils are round equal and briskly reactive to light. CN III, IV, VI: extraocular movement are normal. No ptosis. CN V: Facial sensation is intact to pinprick in all 3 divisions bilaterally. Corneal responses are intact.  CN VII: Face is symmetric with normal eye closure and smile. CN VIII: Hearing is normal to rubbing fingers CN IX, X: Palate elevates symmetrically. Phonation is normal. CN XI: Head turning and shoulder shrug are intact CN XII: Tongue is midline with normal movements and no atrophy.  MOTOR: There is no pronator drift of out-stretched arms. Muscle bulk and tone are normal. Muscle strength is normal.  REFLEXES: Reflexes are 2+ and symmetric at the biceps, triceps, knees, and absent at ankles. Plantar responses are flexor.  SENSORY: Length dependent decreased to light touch, pinprick and vibratory sensation at toes.  COORDINATION: Rapid alternating movements and fine finger movements are intact. There is no dysmetria on finger-to-nose and heel-knee-shin.    GAIT/STANCE: Posture is normal. Gait is steady with normal steps, base, arm swing, and turning. Heel and toe walking are normal. Tandem gait is mildly unsteady. Romberg is mildly positive.   DIAGNOSTIC DATA (LABS, IMAGING, TESTING) - I reviewed patient records, labs, notes, testing and imaging myself where available.   ASSESSMENT AND PLAN  COLTON ENGDAHL is a 71 y.o. 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  She did response to IV Solu-Medrol 500 mg for 3 consecutive days in September 2017 Repeat  MRI of the brain with without contrast in November 2017 showed significant improvement  Now with recurrent occipital headache, transient multiple vision, will have repeat MRI of the brain with and without contrast,  Limb paresthesia EMG/NCS for possible peripheral neuropathy Laboratory evaluations. Cymbalat 51m daily   YMarcial Pacas M.D. Ph.D.  [facility] [address] [place] [address]Ph: [phone]: [phone] CXK:[id number] Reining PA-C

## 2017-06-28 NOTE — Telephone Encounter (Signed)
Pt calls for 2 reasons:   1)  She saw dr Krista Blue this am, dr Krista Blue started her on 60mg  duloxetine daily by mouth, she just wanted dr Beryle Beams to know this  2)  She would like to know how the CT approval is progressing, dr Beryle Beams ordered it 11/20 she thinks?  Will send to dr Beryle Beams and chilonb.

## 2017-06-28 NOTE — Addendum Note (Signed)
Addended by: Marcial Pacas on: 06/28/2017 10:21 AM   Modules accepted: Orders

## 2017-06-29 ENCOUNTER — Other Ambulatory Visit: Payer: Self-pay | Admitting: Family Medicine

## 2017-06-29 DIAGNOSIS — R1011 Right upper quadrant pain: Secondary | ICD-10-CM

## 2017-06-29 DIAGNOSIS — R7303 Prediabetes: Secondary | ICD-10-CM | POA: Insufficient documentation

## 2017-06-29 DIAGNOSIS — D689 Coagulation defect, unspecified: Secondary | ICD-10-CM

## 2017-06-30 ENCOUNTER — Ambulatory Visit
Admission: RE | Admit: 2017-06-30 | Discharge: 2017-06-30 | Disposition: A | Discharge status: Home / Self Care | Payer: Medicare HMO | Source: Ambulatory Visit | Attending: Neurology | Admitting: Neurology

## 2017-06-30 DIAGNOSIS — R519 Headache, unspecified: Secondary | ICD-10-CM

## 2017-06-30 DIAGNOSIS — R51 Headache: Principal | ICD-10-CM

## 2017-06-30 MED ORDER — GADOBENATE DIMEGLUMINE 529 MG/ML IV SOLN
17.0000 mL | Freq: Once | INTRAVENOUS | Status: AC | PRN
  Administered 2017-06-30: 17 mL via INTRAVENOUS

## 2017-07-01 ENCOUNTER — Ambulatory Visit (HOSPITAL_COMMUNITY): Payer: Medicare HMO

## 2017-07-01 LAB — VITAMIN D 25 HYDROXY (VIT D DEFICIENCY, FRACTURES): Vit D, 25-Hydroxy: 41.5 ng/mL (ref 30.0–100.0)

## 2017-07-01 LAB — C-REACTIVE PROTEIN: CRP: 3.6 mg/L (ref 0.0–4.9)

## 2017-07-01 LAB — COPPER, SERUM: Copper: 118 ug/dL (ref 72–166)

## 2017-07-01 LAB — HEPATITIS PANEL, ACUTE
Hep A IgM: NEGATIVE
Hep B C IgM: NEGATIVE
Hep C Virus Ab: <0.1 s/co ratio (ref 0.0–0.9)
Hepatitis B Surface Ag: NEGATIVE

## 2017-07-01 LAB — FOLATE: Folate: 15 ng/mL (ref >3.0)

## 2017-07-01 LAB — SEDIMENTATION RATE: Sed Rate: 5 mm/hr (ref 0–40)

## 2017-07-01 LAB — IMMUNOFIXATION ELECTROPHORESIS
IgA/Immunoglobulin A, Serum: 114 mg/dL (ref 87–352)
IgG (Immunoglobin G), Serum: 1006 mg/dL (ref 700–1600)
IgM (Immunoglobulin M), Srm: 121 mg/dL (ref 26–217)
Total Protein: 6.9 g/dL (ref 6.0–8.5)

## 2017-07-01 LAB — B. BURGDORFI ANTIBODIES: Lyme IgG/IgM Ab: <0.91 {ISR} (ref 0.00–0.90)

## 2017-07-01 LAB — FERRITIN: Ferritin: 39 ng/mL (ref 15–150)

## 2017-07-01 LAB — RPR: RPR Ser Ql: NONREACTIVE

## 2017-07-01 LAB — TSH: TSH: 1.12 u[IU]/mL (ref 0.450–4.500)

## 2017-07-01 LAB — HIV ANTIBODY (ROUTINE TESTING W REFLEX): HIV Screen 4th Generation wRfx: NONREACTIVE

## 2017-07-01 LAB — HGB A1C W/O EAG: Hgb A1c MFr Bld: 6.3 % — ABNORMAL HIGH (ref 4.8–5.6)

## 2017-07-01 LAB — VITAMIN B12: Vitamin B-12: 1721 pg/mL — ABNORMAL HIGH (ref 232–1245)

## 2017-07-01 LAB — ANA W/REFLEX IF POSITIVE: Anti Nuclear Antibody(ANA): NEGATIVE

## 2017-07-03 ENCOUNTER — Telehealth: Payer: Self-pay | Admitting: Neurology

## 2017-07-03 NOTE — Telephone Encounter (Signed)
Please call patient, extensive laboratory evaluation showed mild elevated A1c 6.3, rest of the laboratory evaluation was normal, she is to start with diet control, exercise,  MRI of the brain showed mild age related changes, there was no significant change compared to previous MRI in September 2017.

## 2017-07-03 NOTE — Telephone Encounter (Signed)
Spoke to patient - she is aware of her lab and MRI results.  She is agreeable to the recommendations of diet control and exercise to help lower her A1C.

## 2017-07-07 ENCOUNTER — Other Ambulatory Visit: Payer: Self-pay | Admitting: Family Medicine

## 2017-07-07 ENCOUNTER — Ambulatory Visit
Admission: RE | Admit: 2017-07-07 | Discharge: 2017-07-07 | Disposition: A | Discharge status: Home / Self Care | Payer: Medicare HMO | Source: Ambulatory Visit | Attending: Family Medicine | Admitting: Family Medicine

## 2017-07-07 DIAGNOSIS — R1011 Right upper quadrant pain: Secondary | ICD-10-CM

## 2017-07-07 DIAGNOSIS — R059 Cough, unspecified: Secondary | ICD-10-CM

## 2017-07-07 DIAGNOSIS — R05 Cough: Secondary | ICD-10-CM

## 2017-07-07 DIAGNOSIS — D689 Coagulation defect, unspecified: Secondary | ICD-10-CM

## 2017-07-08 NOTE — Telephone Encounter (Signed)
I still have not seen that the CT was done - can you check on this please? thx DrG

## 2017-07-08 NOTE — Telephone Encounter (Signed)
Per Epic, CT was scheduled on 12/7 which had been canceled. Re-scheduled for 12/17.

## 2017-07-11 ENCOUNTER — Ambulatory Visit (INDEPENDENT_AMBULATORY_CARE_PROVIDER_SITE_OTHER): Payer: Medicare HMO | Admitting: Pharmacist

## 2017-07-11 ENCOUNTER — Ambulatory Visit (HOSPITAL_COMMUNITY): Payer: Medicare HMO

## 2017-07-11 DIAGNOSIS — D6861 Antiphospholipid syndrome: Secondary | ICD-10-CM

## 2017-07-11 DIAGNOSIS — R760 Raised antibody titer: Secondary | ICD-10-CM

## 2017-07-11 DIAGNOSIS — Z7901 Long term (current) use of anticoagulants: Secondary | ICD-10-CM | POA: Diagnosis not present

## 2017-07-11 LAB — POCT INR: INR: 2.6

## 2017-07-11 NOTE — Patient Instructions (Signed)
Patient instructed to take medications as defined in the Anti-coagulation Track section of this encounter.  Patient instructed to take today's dose.  Patient instructed to take one (1) tablet of your 5mg  peach-colored warfarin tablets by mouth, once-daily, at [facility] each day.  Patient verbalized understanding of these instructions.

## 2017-07-11 NOTE — Progress Notes (Signed)
Reviewed thx DrG 

## 2017-07-11 NOTE — Progress Notes (Signed)
Anticoagulation Management [name] is a 71 y.o. female who reports to the clinic for monitoring of warfarin treatment.    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 Duration: indefinite Supervising physician: Murriel Hopper  Anticoagulation Clinic Visit History: Patient does not report signs/symptoms of bleeding or thromboembolism  Other recent changes: No diet, medications, lifestyle changes endorsed by the patient to me at this visit.  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 Bee Venom Anaphylaxis  . Hornet Venom Anaphylaxis    Anaphylaxis Shock SHOCK  . Reglan [Metoclopramide] Shortness Of Breath and Other (See Comments)    Tongue got numb; didn't feel good  . Vitamin K Anaphylaxis and Rash    "kills me" iv  . Benadryl [Diphenhydramine Hcl]     Rash  . Crestor [Rosuvastatin Calcium]     Muscle Aches  . Doxycycline     Tongue burning, thrush  . Levofloxacin     Rash  . Zocor [Simvastatin]     Muscle Aches  . Ciprofloxacin Rash  . Daypro [Oxaprozin] Rash    Rash  . Phytonadione Rash  . Sulfonamide Derivatives Rash   Prior to Admission medications   Medication Sig Start Date End Date Taking? Authorizing Provider  ALPRAZolam [name]) 0.5 MG tablet Take 0.5 mg by mouth 2 (two) times daily as needed for sleep. Every evening and sometimes during the  day if needed 06/07/12  Yes [provider]  aspirin 81 MG tablet Take 81 mg by mouth daily at 6 PM.    Yes [provider]  Calcium Carbonate-Vitamin D (CALCIUM + D PO) Take 600 mg by mouth daily.    Yes [provider]  DULoxetine (CYMBALTA) 60 MG capsule Take 1 capsule (60 mg total) by mouth daily. 06/28/17  Yes Marcial Pacas, MD  fluticasone (FLONASE) 50 MCG/ACT nasal spray Place 2 sprays into the nose daily as needed for allergies.  10/16/14  Yes [provider]  HYDROcodone-acetaminophen (NORCO/VICODIN) 5-325 MG per tablet Take 1 tablet by mouth at bedtime.    Yes [provider]  levothyroxine (SYNTHROID, LEVOTHROID) 125 MCG tablet daily. 06/24/16  Yes [provider]  Magnesium 400 MG TABS Take 400 mg by mouth daily.   Yes [provider]  metoprolol succinate (TOPROL-XL) 25 MG 24 hr tablet Take 12.5 mg by mouth 2 (two) times daily.    Yes [provider]  omeprazole (PRILOSEC) 20 MG capsule Take 20 mg by mouth daily.  02/06/13  Yes [provider]  pravastatin (PRAVACHOL) 20 MG tablet Take 20 mg by mouth daily.   Yes Christain Sacramento, MD  warfarin (COUMADIN) 5 MG tablet Take 1 tablet (5 mg total) by mouth daily for 1 dose.  06/20/17 06/21/17  Annia Belt, MD   Past Medical History:  Diagnosis Date  . Anxiety    severe panic attacks  . Arthritis   . Aseptic meningitis   . Asthma    as child  . Breast cancer ([place])    right  . Chronic anticoagulation 08/04/2015  . Collapsed lung    hx of, left  . Complication of anesthesia    "hard to put asleep"  . Depression   . Fibromyalgia   . GERD (gastroesophageal reflux disease)   . H/O hiatal hernia   . Headache(784.0)   . Hepatitis 1990   "from eating at restaurant"  . History of antiphospholipid antibody syndrome   . 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  . Hx-TIA (transient ischemic attack)   . Hyperlipidemia   . Hypertension   .  Hypothyroidism   . Neuromuscular disorder ([place])   . Pneumonia    hx of  . Soft tissue mass 06/14/2017   3 cm right post axilla same side as previous breast cancer 06/14/17  . Stroke ([place])   . Thrombosis, upper extremity artery ([place]) 04/17/2012   Left digital artery  October 1998 - new dx antiphospholipid antibody syndrome   Social History   Socioeconomic History  . Marital status: Single    Spouse name: Not on file  . Number of children: 2  . Years of education: 60  . Highest education level: Not on file  Social Needs  . Financial resource strain: Not on file  . Food insecurity - worry: Not on file  . Food insecurity - inability: Not on file  . Transportation needs - medical: Not on file  . Transportation needs - non-medical: Not on file  Occupational History  . Not on file  Tobacco Use  . Smoking status: Never Smoker  . Smokeless tobacco: Never Used  Substance and Sexual Activity  . Alcohol use: No    Alcohol/week: 0.0 oz  . Drug use: No  . Sexual activity: Not on file  Other Topics Concern  . Not on file  Social History Narrative   Lives at home with her mother.  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   Right-handed.   2-4 cups caffeine daily.   Family History  Problem Relation Age of Onset  . Alzheimer's disease Mother   . Heart disease Mother   . Hyperlipidemia Mother   . Hypertension Mother   . Cancer Maternal Grandmother        colon  . Heart disease Sister   . Hypertension Sister   . Heart attack Sister   . Heart disease Brother   . Heart attack Brother   . Cancer Daughter   . Hypertension Daughter   . Hypertension Son     ASSESSMENT Recent Results: The most recent result is correlated with 35 mg per week: Lab Results  Component Value Date   INR 2.60 07/11/2017   INR 2.50 05/23/2017   INR 2.30 04/25/2017   PROTIME 30.0 (H) 03/13/2015    Anticoagulation Dosing: Description   DR [id number] PATIENT (route warfarin notes and  list as authorizing provider for LOS & Follow-up, lab orders and warfarin prescriptions), do not list attending physician.  Take one (1) tablet by mouth once-daily at [facility] each day.   Take one (1) tablet of your 5mg  peach-colored warfarin tablets by mouth, once-daily, at [facility] each day.      INR today: Therapeutic  PLAN Weekly dose was unchanged.   Patient Instructions  Patient instructed to take medications as defined in the Anti-coagulation Track section of this encounter.  Patient instructed to take today's dose.  Patient instructed to take one (1) tablet of your 5mg  peach-colored warfarin tablets by mouth, once-daily, at [facility] each day.  Patient verbalized understanding of these instructions.     Patient advised to contact clinic or seek medical attention if signs/symptoms of bleeding or thromboembolism occur.  Patient verbalized understanding by repeating back information and was advised to contact me if further medication-related questions arise. Patient was also provided an information handout.  Follow-up Return in 4 weeks (on 08/08/2017) for Follow up INR at 1100h.  Pennie Banter, PharmD, CACP, CPP  15 minutes spent face-to-face with the patient during the encounter. 50% of time spent on education. 50% of time was spent on fingerstick point of care INR sample collection, processing, results determination and documentation in [email or web address].

## 2017-07-21 DIAGNOSIS — J019 Acute sinusitis, unspecified: Secondary | ICD-10-CM | POA: Insufficient documentation

## 2017-07-22 ENCOUNTER — Ambulatory Visit (HOSPITAL_COMMUNITY)
Admission: RE | Admit: 2017-07-22 | Discharge: 2017-07-22 | Disposition: A | Discharge status: Home / Self Care | Payer: Medicare HMO | Source: Ambulatory Visit | Attending: Oncology | Admitting: Oncology

## 2017-07-22 DIAGNOSIS — I700 Atherosclerosis of aorta: Secondary | ICD-10-CM | POA: Diagnosis not present

## 2017-07-22 DIAGNOSIS — C50511 Malignant neoplasm of lower-outer quadrant of right female breast: Secondary | ICD-10-CM

## 2017-07-22 DIAGNOSIS — I2510 Atherosclerotic heart disease of native coronary artery without angina pectoris: Secondary | ICD-10-CM | POA: Insufficient documentation

## 2017-07-22 DIAGNOSIS — Z170 Estrogen receptor positive status [ER+]: Secondary | ICD-10-CM | POA: Insufficient documentation

## 2017-07-22 DIAGNOSIS — M7989 Other specified soft tissue disorders: Secondary | ICD-10-CM

## 2017-07-22 DIAGNOSIS — M799 Soft tissue disorder, unspecified: Secondary | ICD-10-CM | POA: Insufficient documentation

## 2017-07-27 ENCOUNTER — Telehealth: Payer: Self-pay | Admitting: *Deleted

## 2017-07-27 NOTE — Telephone Encounter (Signed)
Pt called / informed "CT chest with no sign of recurrent cancer " per Dr Beryle Beams. Stated "wonderful news".

## 2017-07-27 NOTE — Telephone Encounter (Signed)
-----   Message from Annia Belt, MD sent at 07/22/2017  3:44 PM EST ----- Call pt: CT chest with no sign of recurrent cancer

## 2017-08-01 ENCOUNTER — Ambulatory Visit: Payer: Medicare HMO | Admitting: Physician Assistant

## 2017-08-01 ENCOUNTER — Telehealth: Payer: Self-pay | Admitting: Family Medicine

## 2017-08-01 ENCOUNTER — Encounter: Payer: Self-pay | Admitting: Physician Assistant

## 2017-08-01 VITALS — BP 136/70 | HR 72 | Ht 65.0 in | Wt 178.0 lb

## 2017-08-01 DIAGNOSIS — I82729 Chronic embolism and thrombosis of deep veins of unspecified upper extremity: Secondary | ICD-10-CM | POA: Diagnosis not present

## 2017-08-01 DIAGNOSIS — E785 Hyperlipidemia, unspecified: Secondary | ICD-10-CM

## 2017-08-01 DIAGNOSIS — E039 Hypothyroidism, unspecified: Secondary | ICD-10-CM | POA: Diagnosis not present

## 2017-08-01 DIAGNOSIS — I10 Essential (primary) hypertension: Secondary | ICD-10-CM

## 2017-08-01 DIAGNOSIS — Z8673 Personal history of transient ischemic attack (TIA), and cerebral infarction without residual deficits: Secondary | ICD-10-CM

## 2017-08-01 DIAGNOSIS — R079 Chest pain, unspecified: Secondary | ICD-10-CM

## 2017-08-01 NOTE — Progress Notes (Signed)
Cardiology Office Note    Date:  08/03/2017   ID:  [name] 03/22/1946, MRN [id number]  PCP:  [name]., PA-C  Cardiologist:  Dr. Percival Spanish   Chief Complaint  Patient presents with  . Follow-up    seen for Dr. Percival Spanish, evaluation for chest pain    History of Present Illness:  [name] is a 72 y.o. female with PMH of anxiety with h/o panic attack, aseptic meningitis, fibromyalgia, h/o DVT, TIA, HTN, HLD, hypothyroidism, and breast CA. She had normal cath in 1998 for chest pain and normal myoview in 2012. Carotid US 11/10/2016 40-59% RICA stenosis, 1-95% LICA stenosis. Recent CT showed coronary calcifications, but no other acute issues.   For the past year, patient has been having some intermittent chest pain lasting about 15 minutes each time.  It happens both at rest and with exertion.  She she also noted some dyspnea on exertion as well.  I recommended a treadmill nuclear stress test to assess further.  Otherwise, she has no sign of heart failure symptom.  She has no lower extremity edema, orthopnea or PND.   Past Medical History:  Diagnosis Date  . Anxiety    severe panic attacks  . Arthritis   . Aseptic meningitis   . Asthma    as child  . Breast cancer ([place])    right  . Chronic anticoagulation 08/04/2015  . Collapsed lung    hx of, left  . Complication of anesthesia    "hard to put asleep"  . Depression   . Fibromyalgia   . GERD (gastroesophageal reflux disease)   . H/O hiatal hernia   . Headache(784.0)   . Hepatitis 1990   "from eating at restaurant"  . History of antiphospholipid antibody syndrome   . 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  . Hx-TIA (transient ischemic attack)   . Hyperlipidemia   . Hypertension   . Hypothyroidism   . Neuromuscular disorder ([place])   . Pneumonia    hx of  . Soft tissue mass 06/14/2017   3 cm right post axilla same side as previous breast cancer 06/14/17  . Stroke ([place])   .  Thrombosis, upper extremity artery ([place]) 04/17/2012   Left digital artery  October 1998 - new dx antiphospholipid antibody syndrome    Past Surgical History:  Procedure Laterality Date  . ABDOMINAL HYSTERECTOMY    . BREAST LUMPECTOMY Right 2000  . BREAST SURGERY Right    x3  . BUNIONECTOMY Bilateral 30 years ago  . CARDIAC CATHETERIZATION  07/12/1997   NORMAL LEFT VENTRICULAR FUNCTION WITH EF AT LEAST 70-75%  . ESOPHAGEAL MANOMETRY N/A 11/06/2012   Procedure: ESOPHAGEAL MANOMETRY (EM);  Surgeon: Pedro Earls, MD;  Location: [facility];  Service: General;  Laterality: N/A;  . ESOPHAGOGASTRODUODENOSCOPY ENDOSCOPY    . KNEE ARTHROSCOPY Right   . LAPAROSCOPIC NISSEN FUNDOPLICATION N/A 0/93/2671   Procedure: LAPAROSCOPIC TAKEDOWN  PERI-HIATAL HERNIA    REPAIR;  Surgeon: Pedro Earls, MD;  Location: [facility];  Service: General;  Laterality: N/A;  . [place]  . RIGHT OOPHORECTOMY  1980  . THYROIDECTOMY  in 20's  . TONSILLECTOMY  72 years old  . UPPER GI ENDOSCOPY N/A 12/12/2012   Procedure: UPPER GI ENDOSCOPY;  Surgeon: Pedro Earls, MD;  Location: [facility];  Service: General;  Laterality: N/A;    Current Medications: Outpatient Medications Prior to Visit  Medication Sig  Dispense Refill  . ALPRAZolam (XANAX) 0.5 MG tablet Take 0.5 mg by mouth 2 (two) times daily as needed for sleep. Every evening and sometimes during the day if needed    . aspirin 81 MG tablet Take 81 mg by mouth daily at 6 PM.     . Cyanocobalamin (VITAMIN B 12 PO) Take 0.5-1 mg by mouth daily.    . fluticasone (FLONASE) 50 MCG/ACT nasal spray Place 2 sprays into the nose daily as needed for allergies.     [name] HYDROcodone-acetaminophen (NORCO/VICODIN) 5-325 MG per tablet Take 1 tablet by mouth at bedtime.     [name] levothyroxine (SYNTHROID, LEVOTHROID) 125 MCG tablet daily.    . Magnesium 400 MG TABS Take 400 mg by mouth daily.    . metoprolol succinate (TOPROL-XL) 25 MG 24 hr tablet Take 12.5 mg by  mouth 2 (two) times daily.     [name] omeprazole (PRILOSEC) 20 MG capsule Take 20 mg by mouth daily.     . pravastatin (PRAVACHOL) 20 MG tablet Take 20 mg by mouth daily.    [name] warfarin (COUMADIN) 5 MG tablet Take 1 tablet (5 mg total) by mouth daily for 1 dose. 90 tablet 3  . Calcium Carbonate-Vitamin D (CALCIUM + D PO) Take 600 mg by mouth daily.     . DULoxetine (CYMBALTA) 60 MG capsule Take 1 capsule (60 mg total) by mouth daily. 30 capsule 12   No facility-administered medications prior to visit.      Allergies:   Bee venom; Hornet venom; Reglan [metoclopramide]; Vitamin k; Benadryl [diphenhydramine hcl]; Crestor [rosuvastatin calcium]; Cymbalta [duloxetine hcl]; Doxycycline; Levofloxacin; Zocor [simvastatin]; Ciprofloxacin; Daypro [oxaprozin]; Phytonadione; and Sulfonamide derivatives   Social History   Socioeconomic History  . Marital status: Single    Spouse name: None  . Number of children: 2  . Years of education: 53  . Highest education level: None  Social Needs  . Financial resource strain: None  . Food insecurity - worry: None  . Food insecurity - inability: None  . Transportation needs - medical: None  . Transportation needs - non-medical: None  Occupational History  . None  Tobacco Use  . Smoking status: Never Smoker  . Smokeless tobacco: Never Used  Substance and Sexual Activity  . Alcohol use: No    Alcohol/week: 0.0 oz  . Drug use: No  . Sexual activity: None  Other Topics Concern  . None  Social History Narrative   Lives at home with her mother.  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   Right-handed.   2-4 cups caffeine daily.     Family History:  The patient's family history includes Alzheimer's disease in her mother; Cancer in her daughter and maternal grandmother; Heart attack in her brother and sister; Heart disease in her brother, mother, and sister; Hyperlipidemia in her mother; Hypertension in her daughter, mother, sister, and son.    ROS:   Please see the history of present illness.    ROS All other systems reviewed and are negative.   PHYSICAL EXAM:   VS:  BP 136/70   Pulse 72   Ht 5\' 5"  (1.651 m)   Wt 178 lb (80.7 kg)   BMI 29.62 kg/m    GEN: Well nourished, well developed, in no acute distress  HEENT: normal  Neck: no JVD, carotid bruits, or masses Cardiac: RRR; no murmurs, rubs, or gallops,no edema  Respiratory:  clear to auscultation bilaterally, normal work of breathing GI: soft, nontender,  nondistended, + BS MS: no deformity or atrophy  Skin: warm and dry, no rash Neuro:  Alert and Oriented x 3, Strength and sensation are intact Psych: euthymic mood, full affect  Wt Readings from Last 3 Encounters:  08/01/17 178 lb (80.7 kg)  06/28/17 184 lb (83.5 kg)  06/14/17 185 lb 8 oz (84.1 kg)      Studies/Labs Reviewed:   EKG:  EKG is not ordered today.  Last EKG reviewed 07/05/2017, sinus rhythm no significant ST-T wave changes  Recent Labs: 10/07/2016: Magnesium 2.2 05/23/2017: ALT 14; BUN 20; Creatinine, Ser 1.02; Hemoglobin 12.6; Platelets 241; Potassium 4.2; Sodium 143 06/28/2017: TSH 1.120   Lipid Panel    Component Value Date/Time   CHOL 204 (H) 04/14/2016 0525   TRIG 231 (H) 04/14/2016 0525   HDL 40 (L) 04/14/2016 0525   CHOLHDL 5.1 04/14/2016 0525   VLDL 46 (H) 04/14/2016 0525   LDLCALC 118 (H) 04/14/2016 0525    Additional studies/ records that were reviewed today include:   Carotid US 11/10/2016 Heterogeneous plaque, bilaterally. 40-59% RICA stenosis. 5-46% LICA stenosis. Normal subclavian arteries, bilaterally. Patent vertebral arteries with antegrade flow.    [facility] [name] 11/10/2016 Technically challenging study. Normal caliber abdominal aorta, common and external iliac arteries, without focal dilatation. Aorto-iliac atherosclerosis, without focal stenosis. Patent IVC    CT Chest 07/22/2017 FINDINGS: Cardiovascular: No acute findings. Aortic and coronary  artery atherosclerosis.  Mediastinum/Nodes: No masses or pathologically enlarged lymph nodes identified on this unenhanced exam.  Lungs/Pleura: No pulmonary infiltrate or mass identified. Stable 3 mm left lower lobe pulmonary nodule, consistent with benign etiology. No effusion present.  Upper Abdomen:  Unremarkable.  Musculoskeletal: No suspicious bone lesions. No evidence of fracture or chest wall soft tissue mass.  IMPRESSION: Stable exam. No acute findings. No evidence of recurrent or metastatic carcinoma.  Aortic Atherosclerosis (ICD10-I70.0). Coronary artery calcification.   ASSESSMENT:    1. Chest pain, unspecified type   2. H/O TIA (transient ischemic attack) and stroke   3. Essential hypertension   4. Hyperlipidemia, unspecified hyperlipidemia type   5. Hypothyroidism, unspecified type   6. 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 Intermittent chest pain: Previous normal coronaries on cath 1998, last normal Myoview 2012.  According to the patient, she actually has been having intermittent chest pain for the past year, it occurs both at rest and with exertion, I plan to proceed with treadmill Myoview.  2. Hypertension: Pressure stable 136/70, on Toprol-XL at home.  3. Hyperlipidemia: On Pravachol 20 mg daily, lipid panel monitored by primary care provider.  Last lipid panel in September 2017 showed LDL 118.  4. Hypothyroidism: Managed by primary care provider, on Synthroid  5. History of TIA: No obvious recurrence  6. H/o extremity DVT on Coumadin: Per patient, she had blood clot in all of her fingers.    Medication Adjustments/Labs and Tests Ordered: Current medicines are reviewed at length with the patient today.  Concerns regarding medicines are outlined above.  Medication changes, Labs and Tests ordered today are listed in the Patient Instructions  below. Patient Instructions  Medication Instructions:  Your physician recommends that you continue on your current medications as directed. Please refer to the Current Medication list given to you today.  Labwork: None   Testing/Procedures: Your physician has requested that you have an exercise stress myoview. For further information please visit [email or web address]. Please  follow instruction sheet, as given.  HOLD Metoprolol the day before the test   Follow-Up: Your physician recommends that you schedule a follow-up appointment in: 3-4 weeks with Almyra Deforest, PA-C on a day Dr Percival Spanish is in the office   Any Other Special Instructions Will Be Listed Below (If Applicable).  If you need a refill on your cardiac medications before your next appointment, please call your pharmacy.     Hilbert Corrigan, [place]  08/03/2017 6:26 AM    [address], [place], [place]  [address] Phone: [phone]; Fax: [phone]

## 2017-08-01 NOTE — Patient Instructions (Addendum)
Medication Instructions:  Your physician recommends that you continue on your current medications as directed. Please refer to the Current Medication list given to you today.  Labwork: None   Testing/Procedures: Your physician has requested that you have an exercise stress myoview. For further information please visit [email or web address]. Please follow instruction sheet, as given.  HOLD Metoprolol the day before the test   Follow-Up: Your physician recommends that you schedule a follow-up appointment in: 3-4 weeks with Almyra Deforest, PA-C on a day Dr Percival Spanish is in the office   Any Other Special Instructions Will Be Listed Below (If Applicable).  If you need a refill on your cardiac medications before your next appointment, please call your pharmacy.

## 2017-08-01 NOTE — Telephone Encounter (Signed)
Pt states she's having stress test on Friday @1230  PM per Dr Percival Spanish; wants to know if she needs to stop Warfarin 3 days prior to this procedure? Thanks

## 2017-08-01 NOTE — Telephone Encounter (Signed)
PT SCHEDULED FOR STRESS TEST 08/05/17, WANTS TO KNOW IF SHE SHOULD SKIP HER NORMAL MEDICATIONS THAT DAY.

## 2017-08-02 ENCOUNTER — Telehealth: Payer: Self-pay | Admitting: *Deleted

## 2017-08-02 NOTE — Telephone Encounter (Signed)
We don't need to cross that bridge unless Dr Percival Spanish plans "something else". Doubt  Even a positive stress test would lead to an immediate surgical procedure.

## 2017-08-02 NOTE — Telephone Encounter (Signed)
-----   Message from Annia Belt, MD sent at 08/01/2017  4:39 PM EST ----- Absolutely not! ----- Message ----- From: Ebbie Latus, RN Sent: 08/01/2017   4:19 PM To: Annia Belt, MD  Pt states she's having stress test on Friday @1230  PM per Dr Percival Spanish; wants to know if she needs to stop Warfarin 3 days prior to this procedure? Kayde Warehime

## 2017-08-02 NOTE — Telephone Encounter (Signed)
Pt called / informed "absolutely not" per Dr Beryle Beams. Voiced understanding - stated "maybe he (Dr Percival Spanish) means if they find something and have to do something else".

## 2017-08-03 ENCOUNTER — Encounter (INDEPENDENT_AMBULATORY_CARE_PROVIDER_SITE_OTHER): Payer: Medicare HMO | Admitting: Neurology

## 2017-08-03 ENCOUNTER — Telehealth: Payer: Self-pay | Admitting: Neurology

## 2017-08-03 ENCOUNTER — Encounter: Payer: Self-pay | Admitting: Physician Assistant

## 2017-08-03 ENCOUNTER — Ambulatory Visit: Payer: Medicare HMO | Admitting: Neurology

## 2017-08-03 ENCOUNTER — Telehealth (HOSPITAL_COMMUNITY): Payer: Self-pay

## 2017-08-03 DIAGNOSIS — R760 Raised antibody titer: Secondary | ICD-10-CM

## 2017-08-03 DIAGNOSIS — R202 Paresthesia of skin: Secondary | ICD-10-CM

## 2017-08-03 DIAGNOSIS — D6861 Antiphospholipid syndrome: Secondary | ICD-10-CM

## 2017-08-03 DIAGNOSIS — R7303 Prediabetes: Secondary | ICD-10-CM

## 2017-08-03 DIAGNOSIS — R930 Abnormal findings on diagnostic imaging of skull and head, not elsewhere classified: Secondary | ICD-10-CM

## 2017-08-03 DIAGNOSIS — M79652 Pain in left thigh: Secondary | ICD-10-CM

## 2017-08-03 DIAGNOSIS — Z0289 Encounter for other administrative examinations: Secondary | ICD-10-CM

## 2017-08-03 MED ORDER — GABAPENTIN 300 MG PO CAPS: ORAL_CAPSULE | ORAL | 0 refills | Status: DC

## 2017-08-03 MED ORDER — PREGABALIN 75 MG PO CAPS: 75.0000 mg | ORAL_CAPSULE | Freq: Three times a day (TID) | ORAL | 5 refills | Status: DC

## 2017-08-03 NOTE — Telephone Encounter (Signed)
Pt isnt wanting to take pregabalin (LYRICA) 75 MG capsule since co-pay is too high she is wanting to switch to [facility] sent through [place], [place]

## 2017-08-03 NOTE — Progress Notes (Signed)
PATIENT: [name] DOB: 1946/01/26  No chief complaint on file.    HISTORICAL  [name] is a 72 year old right-handed female, seen in refer by her primary care from [facility] [name] for evaluation of one episode of severe headache nausea or vomiting, initial evaluation was April 29 2016.  She had antiphospholipid syndrome, had history of digital blood clot, has been on chronic Coumadin treatment for many years, anxiety, hypothyroidism, on supplement, she has been on Requip 1 mg twice a day since 2014 for restless leg syndrome, with sub optimal control of her symptoms, she was recently tried on gabapentin, was not sure about the benefit, previously was treated with gabapentin up to 1800 mg a day without significant improvement, right breast cancer, s/p lobectomy, followed by chemo and radiation therapy in 2000.  She lives with her mother, who suffered severe dementia, she is the main caregiver  I reviewed and summarized hospital record, she was admitted to the hospital on April 13 2016, prior to admission, she complained few days history of headaches, had right parietal region, nausea or vomiting, also noticed multiple visions when looking to the right side, upon presentation, she also complains of left-sided numbness, INR upon presentation was 1.6, I personally reviewed  CT scan of her head showed no acute intracranial abnormality. MRI of her brain showed no signs of an acute stroke. 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  She has been afebrile, she denies stiff neck   Laboratory showed LDL 118, normal CBC, hemoglobin of 14 point 1, normal CMP, with creatinine of 1.42, A1c 6.0, most recent INR on September 29th 7017 was 2.2, heparin unfractionated 0.5, CBC showed hemoglobin 11 point 5, elevated WBC 18  point 8, C-reactive protein 1.3, ESR 11, negative RPR,  Spinal fluid testing RBC 3, WBC 12, lymphocyte 98%, monocytes 2% 0 oligoclonal banding,, total protein is 40, glucose was mildly elevated 74, angiotension converting enzyme 1.4, TSH was elevated 45, and LDL was 118, triglycerides 231, A1c 6.0,  She was evaluated by neuro hospitalists, was given Solu-Medrol 500 mg for 3 consecutive days, she did report significant improvement, she can move her eyes better, no longer has significant double vision. She no longer has significant left sided numbness, but complains of fatigue,  UPDATE Jul 06 2016: We have personally reviewed MRI of the brain with and without contrast on June 06 2016, there was resolution of enhancing perivascular foci noted on previous MRI in September 2017.  She continue have significant restless leg symptoms, at nighttime, sometimes woke up from sleep, urge to move, difficulty falling to sleep, prolonged car ride will also trigger her symptoms, she is taking gabapentin 300 mg 3 times a day, Requip 1 mg 3 times a day,  UPDATE Dec 4th 2018: She complains of occipital area pressure headache, when she turn suddenly to right side, she had transient multiple vision, she has chronic toe paresthesia, she also complains of bilateral fingers numbness, tingling for few weeks, she could not feel the paper.  She denies significant neck pain,  Right upper abdominal pain, she run into right side.  She lives with her 20 years old mother, complains of stress.  UPDATE Aug 03 2016: I reviewed the laboratory evaluation December 2018, A1c was elevated 6.3, otherwise normal negative folic acid, hepatitis panel, ferritin, ANA, Lyme titer, ESR, HIV, TSH, B12, copper level, C-reactive  protein, immune O fixative protein electrophoresis, vitamin D level  REVIEW OF SYSTEMS: Full 14 system review of systems performed and notable only for as above.  ALLERGIES: Allergies  Allergen Reactions  . Bee  Venom Anaphylaxis  . Hornet Venom Anaphylaxis    Anaphylaxis Shock SHOCK  . Reglan [Metoclopramide] Shortness Of Breath and Other (See Comments)    Tongue got numb; didn't feel good  . Vitamin K Anaphylaxis and Rash    "kills me" iv  . Benadryl [Diphenhydramine Hcl]     Rash  . Crestor [Rosuvastatin Calcium]     Muscle Aches  . Cymbalta [Duloxetine Hcl] Swelling    Swelling of tongue and thrush  . Doxycycline     Tongue burning, thrush  . Levofloxacin     Rash  . Zocor [Simvastatin]     Muscle Aches  . Ciprofloxacin Rash  . Daypro [Oxaprozin] Rash    Rash  . Phytonadione Rash  . Sulfonamide Derivatives Rash    HOME MEDICATIONS: Current Outpatient Medications  Medication Sig Dispense Refill  . ALPRAZolam (XANAX) 0.5 MG tablet Take 0.5 mg by mouth 2 (two) times daily as needed for sleep. Every evening and sometimes during the day if needed    . aspirin 81 MG tablet Take 81 mg by mouth daily at 6 PM.     . Calcium Carbonate-Vitamin D (CALCIUM + D PO) Take 600 mg by mouth daily.     . Cyanocobalamin (VITAMIN B 12 PO) Take 0.5-1 mg by mouth daily.    . fluticasone (FLONASE) 50 MCG/ACT nasal spray Place 2 sprays into the nose daily as needed for allergies.     [name] HYDROcodone-acetaminophen (NORCO/VICODIN) 5-325 MG per tablet Take 1 tablet by mouth at bedtime.     [name] levothyroxine (SYNTHROID, LEVOTHROID) 125 MCG tablet daily.    . Magnesium 400 MG TABS Take 400 mg by mouth daily.    . metoprolol succinate (TOPROL-XL) 25 MG 24 hr tablet Take 12.5 mg by mouth 2 (two) times daily.     [name] omeprazole (PRILOSEC) 20 MG capsule Take 20 mg by mouth daily.     . pravastatin (PRAVACHOL) 20 MG tablet Take 20 mg by mouth daily.    [name] warfarin (COUMADIN) 5 MG tablet Take 1 tablet (5 mg total) by mouth daily for 1 dose. 90 tablet 3   No current facility-administered medications for this visit.     PAST MEDICAL HISTORY: Past Medical History:  Diagnosis Date  . Anxiety    severe panic attacks    . Arthritis   . Aseptic meningitis   . Asthma    as child  . Breast cancer ([place])    right  . Chronic anticoagulation 08/04/2015  . Collapsed lung    hx of, left  . Complication of anesthesia    "hard to put asleep"  . Depression   . Fibromyalgia   . GERD (gastroesophageal reflux disease)   . H/O hiatal hernia   . Headache(784.0)   . Hepatitis 1990   "from eating at restaurant"  . History of antiphospholipid antibody syndrome   . 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  . Hx-TIA (transient ischemic attack)   . Hyperlipidemia   . Hypertension   . Hypothyroidism   . Neuromuscular disorder ([place])   . Pneumonia    hx of  . Soft tissue mass 06/14/2017   3 cm right post axilla same side as previous breast cancer 06/14/17  .  Stroke ([place])   . Thrombosis, upper extremity artery ([place]) 04/17/2012   Left digital artery  October 1998 - new dx antiphospholipid antibody syndrome    PAST SURGICAL HISTORY: Past Surgical History:  Procedure Laterality Date  . ABDOMINAL HYSTERECTOMY    . BREAST LUMPECTOMY Right 2000  . BREAST SURGERY Right    x3  . BUNIONECTOMY Bilateral 30 years ago  . CARDIAC CATHETERIZATION  07/12/1997   NORMAL LEFT VENTRICULAR FUNCTION WITH EF AT LEAST 70-75%  . ESOPHAGEAL MANOMETRY N/A 11/06/2012   Procedure: ESOPHAGEAL MANOMETRY (EM);  Surgeon: Pedro Earls, MD;  Location: [facility];  Service: General;  Laterality: N/A;  . ESOPHAGOGASTRODUODENOSCOPY ENDOSCOPY    . KNEE ARTHROSCOPY Right   . LAPAROSCOPIC NISSEN FUNDOPLICATION N/A 6/94/5038   Procedure: LAPAROSCOPIC TAKEDOWN  PERI-HIATAL HERNIA    REPAIR;  Surgeon: Pedro Earls, MD;  Location: [facility];  Service: General;  Laterality: N/A;  . [place]  . RIGHT OOPHORECTOMY  1980  . THYROIDECTOMY  in 20's  . TONSILLECTOMY  72 years old  . UPPER GI ENDOSCOPY N/A 12/12/2012   Procedure: UPPER GI ENDOSCOPY;  Surgeon: Pedro Earls, MD;  Location: [facility];  Service: General;   Laterality: N/A;    FAMILY HISTORY: Family History  Problem Relation Age of Onset  . Alzheimer's disease Mother   . Heart disease Mother   . Hyperlipidemia Mother   . Hypertension Mother   . Cancer Maternal Grandmother        colon  . Heart disease Sister   . Hypertension Sister   . Heart attack Sister   . Heart disease Brother   . Heart attack Brother   . Cancer Daughter   . Hypertension Daughter   . Hypertension Son     SOCIAL HISTORY:  Social History   Socioeconomic History  . Marital status: Single    Spouse name: Not on file  . Number of children: 2  . Years of education: 29  . Highest education level: Not on file  Social Needs  . Financial resource strain: Not on file  . Food insecurity - worry: Not on file  . Food insecurity - inability: Not on file  . Transportation needs - medical: Not on file  . Transportation needs - non-medical: Not on file  Occupational History  . Not on file  Tobacco Use  . Smoking status: Never Smoker  . Smokeless tobacco: Never Used  Substance and Sexual Activity  . Alcohol use: No    Alcohol/week: 0.0 oz  . Drug use: No  . Sexual activity: Not on file  Other Topics Concern  . Not on file  Social History Narrative   Lives at home with her mother.  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   Right-handed.   2-4 cups caffeine daily.     PHYSICAL EXAM   There were no vitals filed for this visit.  Not recorded      There is no height or weight on file to calculate BMI.  PHYSICAL EXAMNIATION:  Gen: NAD, conversant, well nourised, obese, well groomed                     Cardiovascular: Regular rate rhythm, no peripheral edema, warm, nontender. Eyes: Conjunctivae clear without exudates or hemorrhage Neck: Supple, no carotid bruise. Pulmonary: Clear to auscultation bilaterally   NEUROLOGICAL EXAM:  MENTAL STATUS: Speech:    Speech is normal; fluent and spontaneous with normal  comprehension.    Cognition:     Orientation to time, place and person     Normal recent and remote memory     Normal Attention span and concentration     Normal Language, naming, repeating,spontaneous speech     Fund of knowledge   CRANIAL NERVES: CN II: Visual fields are full to confrontation. Fundoscopic exam is normal with sharp discs and no vascular changes. Pupils are round equal and briskly reactive to light. CN III, IV, VI: extraocular movement are normal. No ptosis. CN V: Facial sensation is intact to pinprick in all 3 divisions bilaterally. Corneal responses are intact.  CN VII: Face is symmetric with normal eye closure and smile. CN VIII: Hearing is normal to rubbing fingers CN IX, X: Palate elevates symmetrically. Phonation is normal. CN XI: Head turning and shoulder shrug are intact CN XII: Tongue is midline with normal movements and no atrophy.  MOTOR: There is no pronator drift of out-stretched arms. Muscle bulk and tone are normal. Muscle strength is normal.  REFLEXES: Reflexes are 2+ and symmetric at the biceps, triceps, knees, and absent at ankles. Plantar responses are flexor.  SENSORY: Length dependent decreased to light touch, pinprick and vibratory sensation at toes.  COORDINATION: Rapid alternating movements and fine finger movements are intact. There is no dysmetria on finger-to-nose and heel-knee-shin.    GAIT/STANCE: Posture is normal. Gait is steady with normal steps, base, arm swing, and turning. Heel and toe walking are normal. Tandem gait is mildly unsteady. Romberg is mildly positive.   DIAGNOSTIC DATA (LABS, IMAGING, TESTING) - I reviewed patient records, labs, notes, testing and imaging myself where available.   ASSESSMENT AND PLAN  [name] is a 72 y.o. 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  She did response to IV Solu-Medrol 500 mg for 3 consecutive days in September 2017  We have personally reviewed repeat MRI of the brain in December 2018, supratentorium small vessel disease, no acute abnormality  Limb paresthesia EMG/NCS for possible peripheral neuropathy Laboratory evaluations. Cymbalat 52m daily   YMarcial Pacas M.D. Ph.D.  [facility] [address] [place] [place] [address]Ph: [phone]: [phone] CDH:DIXBOERWJoni Reining PA-C

## 2017-08-03 NOTE — Telephone Encounter (Signed)
Encounter complete. 

## 2017-08-03 NOTE — Telephone Encounter (Signed)
Spoke to patient - states she has previously been on gabapentin 600mg  TID with some benefit and would like to go back to the medication due to the high cost of [place].  Per vo by Dr. Krista Blue, restart gabapentin 300mg , 1-2 capsules TID. She was instructed to start with the lower dose and titrate up as tolerated.  90-day supply sent to [facility].

## 2017-08-05 ENCOUNTER — Ambulatory Visit (HOSPITAL_COMMUNITY)
Admission: RE | Admit: 2017-08-05 | Discharge: 2017-08-05 | Disposition: A | Discharge status: Home / Self Care | Payer: Medicare HMO | Source: Ambulatory Visit | Attending: Cardiovascular Disease | Admitting: Cardiovascular Disease

## 2017-08-05 DIAGNOSIS — I10 Essential (primary) hypertension: Secondary | ICD-10-CM | POA: Diagnosis not present

## 2017-08-05 DIAGNOSIS — R0609 Other forms of dyspnea: Secondary | ICD-10-CM | POA: Diagnosis not present

## 2017-08-05 DIAGNOSIS — F419 Anxiety disorder, unspecified: Secondary | ICD-10-CM | POA: Diagnosis not present

## 2017-08-05 DIAGNOSIS — M797 Fibromyalgia: Secondary | ICD-10-CM | POA: Diagnosis not present

## 2017-08-05 DIAGNOSIS — R079 Chest pain, unspecified: Secondary | ICD-10-CM

## 2017-08-05 DIAGNOSIS — Z8249 Family history of ischemic heart disease and other diseases of the circulatory system: Secondary | ICD-10-CM | POA: Diagnosis not present

## 2017-08-05 DIAGNOSIS — Z8673 Personal history of transient ischemic attack (TIA), and cerebral infarction without residual deficits: Secondary | ICD-10-CM | POA: Diagnosis not present

## 2017-08-05 DIAGNOSIS — I2510 Atherosclerotic heart disease of native coronary artery without angina pectoris: Secondary | ICD-10-CM | POA: Diagnosis not present

## 2017-08-05 DIAGNOSIS — Z853 Personal history of malignant neoplasm of breast: Secondary | ICD-10-CM | POA: Insufficient documentation

## 2017-08-05 LAB — MYOCARDIAL PERFUSION IMAGING
Estimated workload: 7 METS
Exercise duration (min): 6 min
Exercise duration (sec): 1 s
LV dias vol: 53 mL (ref 46–106)
LV sys vol: 19 mL
MPHR: 150 {beats}/min
Peak HR: 142 {beats}/min
Percent HR: 94 %
RPE: 18
Rest HR: 71 {beats}/min
SDS: 0
SRS: 0
SSS: 0
TID: 1

## 2017-08-05 MED ORDER — TECHNETIUM TC 99M TETROFOSMIN IV KIT
31.4000 | PACK | Freq: Once | INTRAVENOUS | Status: AC | PRN
  Administered 2017-08-05: 31.4 via INTRAVENOUS
  Filled 2017-08-05: qty 32

## 2017-08-05 MED ORDER — TECHNETIUM TC 99M TETROFOSMIN IV KIT
10.2000 | PACK | Freq: Once | INTRAVENOUS | Status: AC | PRN
  Administered 2017-08-05: 10.2 via INTRAVENOUS
  Filled 2017-08-05: qty 11

## 2017-08-08 ENCOUNTER — Ambulatory Visit (INDEPENDENT_AMBULATORY_CARE_PROVIDER_SITE_OTHER): Payer: Medicare HMO | Admitting: Pharmacist

## 2017-08-08 DIAGNOSIS — Z5181 Encounter for therapeutic drug level monitoring: Secondary | ICD-10-CM

## 2017-08-08 DIAGNOSIS — Z7901 Long term (current) use of anticoagulants: Secondary | ICD-10-CM | POA: Diagnosis not present

## 2017-08-08 DIAGNOSIS — D6861 Antiphospholipid syndrome: Secondary | ICD-10-CM

## 2017-08-08 DIAGNOSIS — R760 Raised antibody titer: Secondary | ICD-10-CM | POA: Diagnosis not present

## 2017-08-08 LAB — POCT INR: INR: 2.5

## 2017-08-08 NOTE — Progress Notes (Signed)
Reviewed thx DrG 

## 2017-08-08 NOTE — Patient Instructions (Signed)
Patient instructed to take medications as defined in the Anti-coagulation Track section of this encounter.  Patient instructed to take today's dose.  Patient instructed to take one (1) tablet of your 5mg  peach-colored warfarin tablets by mouth, once-daily, at [facility] each day.  Patient verbalized understanding of these instructions.

## 2017-08-08 NOTE — Procedures (Signed)
Full Name: [name] Gender: Female MRN #: [id number] Date of Birth: [birth year]    Visit Date: 08/03/17 08:21 Age: 72 Years 0 Months Old Examining Physician: Marcial Pacas, MD  Referring Physician: Krista Blue, MD History: 72 years old female complains of bilateral lower extremity paresthesia  Summary of the tests: Nerve conduction study: Bilateral sural, superficial sensory responses showed mildly decreased to snap amplitude.  Bilateral peroneal to EDB, and tibial motor responses were normal.  Bilateral median sensory responses showed mild to moderately prolonged peak latency was well preserved snap amplitude.  Bilateral median motor responses were normal.  Left ulnar sensory and motor responses were normal.  Electromyography: Selective needle examination was performed at the bilateral lower extremity muscles and left upper extremity muscles there was no significant abnormality found.  Conclusion: This is a mild abnormal study.  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  There is no evidence of bilateral lumbosacral radiculopathy or left cervical radiculopathy.    ------------------------------- Marcial Pacas, M.D.  [facility] [place], [place] [address] Tel: [phone] Fax: [phone]        [facility]    Nerve / Sites Muscle Latency Ref. Amplitude Ref. Rel Amp Segments Distance Velocity Ref. Area    ms ms mV mV %  cm m/s m/s mVms  L Median - APB     Wrist APB 4.2 ?4.4 5.6 ?4.0 100 Wrist - APB 7   18.6     Upper arm APB 9.2  4.8  85.4 Upper arm - Wrist 23 46 ?49 17.0  R Median - APB     Wrist APB 4.0 ?4.4 8.1 ?4.0 100 Wrist - APB 7   28.1     Upper arm APB 8.1  7.9  97.3 Upper arm - Wrist 22 53 ?49 28.7  L Ulnar - ADM     Wrist ADM 2.6 ?3.3 8.3 ?6.0 100 Wrist - ADM 7   25.9     B.Elbow ADM 5.5  7.4  90.1 B.Elbow - Wrist 16 55 ?49 25.9     A.Elbow ADM 7.4  7.2  97.5 A.Elbow - B.Elbow 10 51 ?49  25.0         A.Elbow - Wrist      L Peroneal - EDB     Ankle EDB 5.0 ?6.5 2.9 ?2.0 100 Ankle - EDB 9   7.7     Fib head EDB 13.0  2.5  88.4 Fib head - Ankle 31 39 ?44 7.2     Pop fossa EDB 15.8  2.3  92.1 Pop fossa - Fib head 10 36 ?44 7.4         Pop fossa - Ankle      R Peroneal - EDB     Ankle EDB 5.5 ?6.5 3.4 ?2.0 100 Ankle - EDB 9   11.0     Fib head EDB 13.1  2.8  83.7 Fib head - Ankle 31 41 ?44 10.4     Pop fossa EDB 15.4  2.7  94.5 Pop fossa - Fib head 10 43 ?44 10.1         Pop fossa - Ankle      L Tibial - AH     Ankle AH 5.5 ?5.8 3.0 ?4.0 100 Ankle - AH 9   10.0     Pop fossa AH 16.4  2.1  69.4 Pop fossa - Ankle 35 32 ?41 8.6  R Tibial -  AH     Ankle AH 5.7 ?5.8 3.0 ?4.0 100 Ankle - AH 9   12.4     Pop fossa AH 15.7  2.7  89.8 Pop fossa - Ankle 35 35 ?41 11.5                   SNC    Nerve / Sites Rec. Site Peak Lat Ref.  Amp Ref. Segments Distance Peak Diff Ref.    ms ms V V  cm ms ms  L Radial - Anatomical snuff box (Forearm)     Forearm Wrist 3.0 ?2.9 6 ?15 Forearm - Wrist 10    L Sural - Ankle (Calf)     Calf Ankle 5.4 ?4.4 2 ?6 Calf - Ankle 14    R Sural - Ankle (Calf)     Calf Ankle 5.3 ?4.4 3 ?6 Calf - Ankle 14    L Superficial peroneal - Ankle     Lat leg Ankle 4.6 ?4.4 2 ?6 Lat leg - Ankle 14    R Superficial peroneal - Ankle     Lat leg Ankle NR ?4.4 NR ?6 Lat leg - Ankle 14    L Median, Ulnar - Transcarpal comparison     Median Palm Wrist 2.8 ?2.2 34 ?35 Median Palm - Wrist 8       Ulnar Palm Wrist 2.3 ?2.2 10 ?12 Ulnar Palm - Wrist 8          Median Palm - Ulnar Palm  0.5 ?0.4  R Median, Ulnar - Transcarpal comparison     Median Palm Wrist 2.8 ?2.2 30 ?35 Median Palm - Wrist 8       Ulnar Palm Wrist 2.3 ?2.2 5 ?12 Ulnar Palm - Wrist 8          Median Palm - Ulnar Palm  0.5 ?0.4  L Median - Orthodromic (Dig II, Mid palm)     Dig II Wrist 3.9 ?3.4 9 ?10 Dig II - Wrist 13    R Median - Orthodromic (Dig II, Mid palm)     Dig II Wrist 4.1 ?3.4 8 ?10 Dig  II - Wrist 13    L Ulnar - Orthodromic, (Dig V, Mid palm)     Dig V Wrist 3.1 ?3.1 4 ?5 Dig V - Wrist 11    R Ulnar - Orthodromic, (Dig V, Mid palm)     Dig V Wrist 3.0 ?3.1 4 ?5 Dig V - Wrist 33                             F  Wave    Nerve F Lat Ref.   ms ms  L Tibial - AH 61.6 ?56.0  R Tibial - AH 63.1 ?56.0  L Ulnar - ADM 25.2 ?32.0           EMG full       EMG Summary Table    Spontaneous MUAP Recruitment  Muscle IA Fib PSW Fasc Other Amp Dur. Poly Pattern  R. Tibialis anterior Normal None None None _______ Normal Normal Normal Normal  R. Tibialis posterior Normal None None None _______ Normal Normal Normal Normal  R. Gastrocnemius (Medial head) Normal None None None _______ Normal Normal Normal Normal  R. Peroneus longus Normal None None None _______ Normal Normal Normal Normal  R. Vastus lateralis Normal None None None _______ Normal Normal Normal Normal  L. Tibialis anterior Normal None  None None _______ Normal Normal Normal Normal  L. Tibialis posterior Normal None None None _______ Normal Normal Normal Normal  L. Peroneus longus Normal None None None _______ Normal Normal Normal Normal  L. Gastrocnemius (Medial head) Normal None None None _______ Normal Normal Normal Normal  L. Abductor hallucis Increased None None None _______ Normal Normal Normal Reduced  L. Vastus lateralis Normal None None None _______ Normal Normal Normal Normal  L. First dorsal interosseous Normal None None None _______ Normal Normal Normal Normal  L. Pronator teres Normal None None None _______ Normal Normal Normal Normal  L. Extensor digitorum communis Normal None None None _______ Normal Normal Normal Normal  L. Biceps brachii Normal None None None _______ Normal Normal Normal Normal  L. Deltoid Normal None None None _______ Normal Normal Normal Normal  L. Triceps brachii Normal None None None _______ Normal Normal Normal Normal  R. Abductor hallucis Increased None None None _______ Normal Normal  Normal Reduced  L. Abductor digiti minimi (manus) Normal None None None _______ Normal Normal Normal Normal  L. Abductor pollicis brevis Increased None None None _______ Normal Normal Normal Reduced

## 2017-08-08 NOTE — Progress Notes (Signed)
Anticoagulation Management [name] is a 72 y.o. female who reports to the clinic for monitoring of warfarin treatment.    Indication: Antiphospholipid antibody syndrome, Lupus anticoagulant positive; long term current use of anticoagulants.  Duration: indefinite Supervising physician: Murriel Hopper  Anticoagulation Clinic Visit History: Patient does not report signs/symptoms of bleeding or thromboembolism  Other recent changes: No diet, medications, lifestyle changes endorsed by the patient to me at this visit.  Anticoagulation Episode Summary    Current INR goal:   2.0-3.0  TTR:   69.4 % (6.1 y)  Next INR check:   09/05/2017  INR from last check:   2.50 (08/08/2017)  Weekly max warfarin dose:     Target end date:     INR check location:     Preferred lab:     Send INR reminders to:      Indications   Antiphospholipid antibody syndrome (HCC) [D68.61] Lupus anticoagulant positive [R76.0]       Comments:         Anticoagulation Care Providers    Provider Role Specialty Phone number   Annia Belt, MD Referring Oncology [phone]      Allergies  Allergen Reactions  . Bee Venom Anaphylaxis  . Hornet Venom Anaphylaxis    Anaphylaxis Shock SHOCK  . Reglan [Metoclopramide] Shortness Of Breath and Other (See Comments)    Tongue got numb; didn't feel good  . Vitamin K Anaphylaxis and Rash    "kills me" iv  . Benadryl [Diphenhydramine Hcl]     Rash  . Crestor [Rosuvastatin Calcium]     Muscle Aches  . Cymbalta [Duloxetine Hcl] Swelling    Swelling of tongue and thrush  . Doxycycline     Tongue burning, thrush  . Levofloxacin     Rash  . Zocor [Simvastatin]     Muscle Aches  . Ciprofloxacin Rash  . Daypro [Oxaprozin] Rash    Rash  . Phytonadione Rash  . Sulfonamide Derivatives Rash   Prior to Admission medications   Medication Sig Start Date End Date Taking? Authorizing Provider  ALPRAZolam [name]) 0.5 MG tablet Take 0.5 mg by mouth 2 (two)  times daily as needed for sleep. Every evening and sometimes during the day if needed 06/07/12  Yes [provider]  aspirin 81 MG tablet Take 81 mg by mouth daily at 6 PM.    Yes [provider]  Calcium Carbonate-Vitamin D (CALCIUM + D PO) Take 600 mg by mouth daily.    Yes [provider]  Cyanocobalamin (VITAMIN B 12 PO) Take 0.5-1 mg by mouth daily.   Yes [provider]  fluticasone (FLONASE) 50 MCG/ACT nasal spray Place 2 sprays into the nose daily as needed for allergies.  10/16/14  Yes [provider]  gabapentin (NEURONTIN) 300 MG capsule Take 1-2 capsules TID. 08/03/17  Yes Marcial Pacas, MD  HYDROcodone-acetaminophen (NORCO/VICODIN) 5-325 MG per tablet Take 1 tablet by mouth at bedtime.    Yes [provider]  levothyroxine (SYNTHROID, LEVOTHROID) 125 MCG tablet daily. 06/24/16  Yes [provider]  Magnesium 400 MG TABS Take 400 mg by mouth daily.   Yes [provider]  metoprolol succinate (TOPROL-XL) 25 MG 24 hr tablet Take 12.5 mg by mouth 2 (two) times daily.    Yes [provider]  omeprazole (PRILOSEC) 20 MG capsule Take 20 mg by mouth daily.  02/06/13  Yes [provider]  pravastatin (PRAVACHOL) 20 MG tablet Take 20 mg by mouth  daily.   Yes Christain Sacramento, MD  warfarin (COUMADIN) 5 MG tablet Take 1 tablet (5 mg total) by mouth daily for 1 dose. 06/20/17 08/01/17  Annia Belt, MD   Past Medical History:  Diagnosis Date  . Anxiety    severe panic attacks  . Arthritis   . Aseptic meningitis   . Asthma    as child  . Breast cancer ([place])    right  . Chronic anticoagulation 08/04/2015  . Collapsed lung    hx of, left  . Complication of anesthesia    "hard to put asleep"  . Depression   . Fibromyalgia   . GERD (gastroesophageal reflux disease)   . H/O hiatal hernia   . Headache(784.0)   . Hepatitis 1990   "from eating at restaurant"  . History of antiphospholipid antibody syndrome    . 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  . Hx-TIA (transient ischemic attack)   . Hyperlipidemia   . Hypertension   . Hypothyroidism   . Neuromuscular disorder ([place])   . Pneumonia    hx of  . Soft tissue mass 06/14/2017   3 cm right post axilla same side as previous breast cancer 06/14/17  . Stroke ([place])   . 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 Marital status: Single    Spouse name: Not on file  . Number of children: 2  . Years of education: 61  . Highest education level: Not on file  Social Needs  . Financial resource strain: Not on file  . Food insecurity - worry: Not on file  . Food insecurity - inability: Not on file  . Transportation needs - medical: Not on file  . Transportation needs - non-medical: Not on file  Occupational History  . Not on file  Tobacco Use  . Smoking status: Never Smoker  . Smokeless tobacco: Never Used  Substance and Sexual Activity  . Alcohol use: No    Alcohol/week: 0.0 oz  . Drug use: No  . Sexual activity: Not on file  Other Topics Concern  . Not on file  Social History Narrative   Lives at home with her mother.  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   Right-handed.   2-4 cups caffeine daily.   Family History  Problem Relation Age of Onset  . Alzheimer's disease Mother   . Heart disease Mother   . Hyperlipidemia Mother   . Hypertension Mother   . Cancer Maternal Grandmother        colon  . Heart disease Sister   . Hypertension Sister   . Heart attack Sister   . Heart disease Brother   . Heart attack Brother   . Cancer Daughter   . Hypertension Daughter   . Hypertension Son     ASSESSMENT Recent Results: The most recent result is correlated with 35 mg per week: Lab Results  Component Value Date   INR 2.50 08/08/2017   INR 2.60 07/11/2017   INR  2.50 05/23/2017   PROTIME 30.0 (H) 03/13/2015    Anticoagulation Dosing: Description   DR [id number] PATIENT (route warfarin notes and list as authorizing provider for LOS & Follow-up, lab orders and warfarin prescriptions), do not list attending physician.  Take one (1) tablet of your 5mg  peach-colored warfarin tablets by mouth,  once-daily, at [facility] each day.      INR today: Therapeutic  PLAN Weekly dose was unchanged.   Patient Instructions  Patient instructed to take medications as defined in the Anti-coagulation Track section of this encounter.  Patient instructed to take today's dose.  Patient instructed to take one (1) tablet of your 5mg  peach-colored warfarin tablets by mouth, once-daily, at [facility] each day.  Patient verbalized understanding of these instructions.     Patient advised to contact clinic or seek medical attention if signs/symptoms of bleeding or thromboembolism occur.  Patient verbalized understanding by repeating back information and was advised to contact me if further medication-related questions arise. Patient was also provided an information handout.  Follow-up Return in 4 weeks (on 09/05/2017) for Follow up INR at 1130h.  Pennie Banter, PharmD, CACP, CPP  15 minutes spent face-to-face with the patient during the encounter. 50% of time spent on education. 50% of time was spent on fingerstick point of care INR sample collection, processing, results determination, documentation in [email or web address].

## 2017-08-26 ENCOUNTER — Ambulatory Visit: Payer: Medicare HMO | Admitting: Physician Assistant

## 2017-09-05 ENCOUNTER — Ambulatory Visit (INDEPENDENT_AMBULATORY_CARE_PROVIDER_SITE_OTHER): Payer: Medicare HMO | Admitting: Pharmacist

## 2017-09-05 DIAGNOSIS — D6861 Antiphospholipid syndrome: Secondary | ICD-10-CM

## 2017-09-05 DIAGNOSIS — Z7901 Long term (current) use of anticoagulants: Secondary | ICD-10-CM | POA: Diagnosis not present

## 2017-09-05 DIAGNOSIS — R760 Raised antibody titer: Secondary | ICD-10-CM | POA: Diagnosis not present

## 2017-09-05 LAB — POCT INR: INR: 1.7

## 2017-09-05 NOTE — Patient Instructions (Signed)
Patient instructed to take medications as defined in the Anti-coagulation Track section of this encounter.  Patient instructed to take today's dose.  Patient instructed to take one (1) tablet of your 5mg  peach-colored warfarin tablets by mouth, once-daily, at [facility] each day--EXCEPT on MONDAYS and THURSDAYS, take 1 & 1/2 tablets on Mondays and Thursdays. Patient verbalized understanding of these instructions.

## 2017-09-05 NOTE — Progress Notes (Signed)
Anticoagulation Management [name] is a 72 y.o. female who reports to the clinic for monitoring of warfarin treatment.    Indication: Antiphospholipid antibody syndrome (HCC) ;D68.61], Lupus anticoagulant positive [R76.0]; long term (current) use of anticoagulants.    Duration: indefinite Supervising physician: [name]  Anticoagulation Clinic Visit History: Patient does not report signs/symptoms of bleeding or thromboembolism  Other recent changes: No diet, medications, lifestyle changes endorsed by the patient at this visit.  Anticoagulation Episode Summary    Current INR goal:   2.0-3.0  TTR:   69.3 % (6.2 y)  Next INR check:   09/26/2017  INR from last check:   1.70! (09/05/2017)  Weekly max warfarin dose:     Target end date:     INR check location:     Preferred lab:     Send INR reminders to:      Indications   Antiphospholipid antibody syndrome (HCC) [D68.61] Lupus anticoagulant positive [R76.0]       Comments:         Anticoagulation Care Providers    Provider Role Specialty Phone number   [name] Belt, MD Referring Oncology [phone]      Allergies  Allergen Reactions  . Bee Venom Anaphylaxis  . Hornet Venom Anaphylaxis    Anaphylaxis Shock SHOCK  . Reglan [Metoclopramide] Shortness Of Breath and Other (See Comments)    Tongue got numb; didn't feel good  . Vitamin K Anaphylaxis and Rash    "kills me" iv  . Benadryl [Diphenhydramine Hcl]     Rash  . Crestor [Rosuvastatin Calcium]     Muscle Aches  . Cymbalta [Duloxetine Hcl] Swelling    Swelling of tongue and thrush  . Doxycycline     Tongue burning, thrush  . Levofloxacin     Rash  . Zocor [Simvastatin]     Muscle Aches  . Ciprofloxacin Rash  . Daypro [Oxaprozin] Rash    Rash  . Phytonadione Rash  . Sulfonamide Derivatives Rash   Prior to Admission medications   Medication Sig Start Date End Date Taking? Authorizing Provider  ALPRAZolam [name]) 0.5 MG tablet Take  0.5 mg by mouth 2 (two) times daily as needed for sleep. Every evening and sometimes during the day if needed 06/07/12  Yes [provider]  aspirin 81 MG tablet Take 81 mg by mouth daily at 6 PM.    Yes [provider]  Calcium Carbonate-Vitamin D (CALCIUM + D PO) Take 600 mg by mouth daily.    Yes [provider]  Cyanocobalamin (VITAMIN B 12 PO) Take 0.5-1 mg by mouth daily.   Yes [provider]  fluticasone (FLONASE) 50 MCG/ACT nasal spray Place 2 sprays into the nose daily as needed for allergies.  10/16/14  Yes [provider]  gabapentin (NEURONTIN) 300 MG capsule Take 1-2 capsules TID. 08/03/17  Yes [name] Pacas, MD  HYDROcodone-acetaminophen (NORCO/VICODIN) 5-325 MG per tablet Take 1 tablet by mouth at bedtime.    Yes [provider]  levothyroxine (SYNTHROID, LEVOTHROID) 125 MCG tablet daily. 06/24/16  Yes [provider]  Magnesium 400 MG TABS Take 400 mg by mouth daily.   Yes [provider]  metoprolol succinate (TOPROL-XL) 25 MG 24 hr tablet Take 12.5 mg by mouth 2 (two) times daily.    Yes [provider]  omeprazole (PRILOSEC) 20 MG capsule Take 20 mg by mouth daily.  02/06/13  Yes [provider]  pravastatin (PRAVACHOL) 20 MG tablet Take 20  mg by mouth daily.   Yes [name] Sacramento, MD  warfarin (COUMADIN) 5 MG tablet Take 1 tablet (5 mg total) by mouth daily for 1 dose. 06/20/17 08/01/17  [name] Belt, MD   Past Medical History:  Diagnosis Date  . Anxiety    severe panic attacks  . Arthritis   . Aseptic meningitis   . Asthma    as child  . Breast cancer ([place])    right  . Chronic anticoagulation 08/04/2015  . Collapsed lung    hx of, left  . Complication of anesthesia    "hard to put asleep"  . Depression   . Fibromyalgia   . GERD (gastroesophageal reflux disease)   . H/O hiatal hernia   . Headache(784.0)   . Hepatitis 1990   "from eating at restaurant"  . History of  antiphospholipid antibody syndrome   . 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  . Hx-TIA (transient ischemic attack)   . Hyperlipidemia   . Hypertension   . Hypothyroidism   . Neuromuscular disorder ([place])   . Pneumonia    hx of  . Soft tissue mass 06/14/2017   3 cm right post axilla same side as previous breast cancer 06/14/17  . Stroke ([place])   . 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 Marital status: Single    Spouse name: Not on file  . Number of children: 2  . Years of education: 72  . Highest education level: Not on file  Social Needs  . Financial resource strain: Not on file  . Food insecurity - worry: Not on file  . Food insecurity - inability: Not on file  . Transportation needs - medical: Not on file  . Transportation needs - non-medical: Not on file  Occupational History  . Not on file  Tobacco Use  . Smoking status: Never Smoker  . Smokeless tobacco: Never Used  Substance and Sexual Activity  . Alcohol use: No    Alcohol/week: 0.0 oz  . Drug use: No  . Sexual activity: Not on file  Other Topics Concern  . Not on file  Social History Narrative   Lives at home with her mother.  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   Right-handed.   2-4 cups caffeine daily.   Family History  Problem Relation Age of Onset  . Alzheimer's disease Mother   . Heart disease Mother   . Hyperlipidemia Mother   . Hypertension Mother   . Cancer Maternal Grandmother        colon  . Heart disease Sister   . Hypertension Sister   . Heart attack Sister   . Heart disease Brother   . Heart attack Brother   . Cancer Daughter   . Hypertension Daughter   . Hypertension Son     ASSESSMENT Recent Results: The most recent result is correlated with 35 mg per week: Lab Results  Component Value Date   INR 1.70  09/05/2017   INR 2.50 08/08/2017   INR 2.60 07/11/2017   PROTIME 30.0 (H) 03/13/2015    Anticoagulation Dosing: Description   DR [id number] PATIENT (route warfarin notes and list as authorizing provider for LOS & Follow-up, lab orders and warfarin prescriptions), do not list attending physician.  Take one (1) tablet of your 5mg  peach-colored warfarin  tablets by mouth, once-daily, at [facility] each day--EXCEPT on MONDAYS and THURSDAYS, take 1 & 1/2 tablets on Mondays and Thursdays.      INR today: Subtherapeutic  PLAN Weekly dose was increased by 14% to 40 mg per week  Patient Instructions  Patient instructed to take medications as defined in the Anti-coagulation Track section of this encounter.  Patient instructed to take today's dose.  Patient instructed to take one (1) tablet of your 5mg  peach-colored warfarin tablets by mouth, once-daily, at [facility] each day--EXCEPT on MONDAYS and THURSDAYS, take 1 & 1/2 tablets on Mondays and Thursdays. Patient verbalized understanding of these instructions.     Patient advised to contact clinic or seek medical attention if signs/symptoms of bleeding or thromboembolism occur.  Patient verbalized understanding by repeating back information and was advised to contact me if further medication-related questions arise. Patient was also provided an information handout.  Follow-up Return in 3 weeks (on 09/26/2017) for Follow up INR at 1115h.  Pennie Banter, PharmD, CACP, CPP  15 minutes spent face-to-face with the patient during the encounter. 50% of time spent on education. 50% of time was spent on fingerstick point of care INR sample collection, processing, results determination, dose adjustment and documentation in [email or web address].

## 2017-09-05 NOTE — Progress Notes (Signed)
Reviewed thx DrG 

## 2017-09-26 ENCOUNTER — Ambulatory Visit (INDEPENDENT_AMBULATORY_CARE_PROVIDER_SITE_OTHER): Payer: Medicare HMO | Admitting: Pharmacist

## 2017-09-26 ENCOUNTER — Ambulatory Visit: Payer: Medicare HMO

## 2017-09-26 ENCOUNTER — Encounter (INDEPENDENT_AMBULATORY_CARE_PROVIDER_SITE_OTHER): Payer: Self-pay

## 2017-09-26 DIAGNOSIS — D6861 Antiphospholipid syndrome: Secondary | ICD-10-CM | POA: Diagnosis not present

## 2017-09-26 DIAGNOSIS — R760 Raised antibody titer: Secondary | ICD-10-CM

## 2017-09-26 DIAGNOSIS — Z7901 Long term (current) use of anticoagulants: Secondary | ICD-10-CM | POA: Diagnosis not present

## 2017-09-26 DIAGNOSIS — Z5181 Encounter for therapeutic drug level monitoring: Secondary | ICD-10-CM | POA: Diagnosis not present

## 2017-09-26 LAB — POCT INR: INR: 2.6

## 2017-09-26 NOTE — Progress Notes (Signed)
Reviewed thx DrG 

## 2017-09-26 NOTE — Patient Instructions (Signed)
Patient instructed to take medications as defined in the Anti-coagulation Track section of this encounter.  Patient instructed to take today's dose.  Patient instructed to take one (1) tablet of your 5mg  peach-colored warfarin tablets by mouth, once-daily, at [facility] each day--EXCEPT on MONDAYS and THURSDAYS, take 1 & 1/2 tablets on Mondays and Thursdays. Patient verbalized understanding of these instructions.

## 2017-09-26 NOTE — Progress Notes (Signed)
Anticoagulation Management [name] is a 72 y.o. female who reports to the clinic for monitoring of warfarin treatment.    Indication: Antiphospholipid antibody syndrome (HCC) [D68.61], Lupus anticoagulant positive [R76.0]; current long term use of anticoagulant.    Duration: indefinite Supervising physician: Murriel Hopper  Anticoagulation Clinic Visit History: Patient does not report signs/symptoms of bleeding or thromboembolism  Other recent changes: No diet, medications, lifestyle changes endorsed by the patient at this visit.  Anticoagulation Episode Summary    Current INR goal:   2.0-3.0  TTR:   69.3 % (6.2 y)  Next INR check:   10/24/2017  INR from last check:   2.60 (09/26/2017)  Weekly max warfarin dose:     Target end date:     INR check location:     Preferred lab:     Send INR reminders to:      Indications   Antiphospholipid antibody syndrome (HCC) [D68.61] Lupus anticoagulant positive [R76.0]       Comments:         Anticoagulation Care Providers    Provider Role Specialty Phone number   Annia Belt, MD Referring Oncology [phone]      Allergies  Allergen Reactions  . Bee Venom Anaphylaxis  . Hornet Venom Anaphylaxis    Anaphylaxis Shock SHOCK  . Reglan [Metoclopramide] Shortness Of Breath and Other (See Comments)    Tongue got numb; didn't feel good  . Vitamin K Anaphylaxis and Rash    "kills me" iv  . Benadryl [Diphenhydramine Hcl]     Rash  . Crestor [Rosuvastatin Calcium]     Muscle Aches  . Cymbalta [Duloxetine Hcl] Swelling    Swelling of tongue and thrush  . Doxycycline     Tongue burning, thrush  . Levofloxacin     Rash  . Zocor [Simvastatin]     Muscle Aches  . Ciprofloxacin Rash  . Daypro [Oxaprozin] Rash    Rash  . Phytonadione Rash  . Sulfonamide Derivatives Rash   Prior to Admission medications   Medication Sig Start Date End Date Taking? Authorizing Provider  ALPRAZolam [name]) 0.5 MG tablet Take 0.5 mg  by mouth 2 (two) times daily as needed for sleep. Every evening and sometimes during the day if needed 06/07/12  Yes [provider]  aspirin 81 MG tablet Take 81 mg by mouth daily at 6 PM.    Yes [provider]  Calcium Carbonate-Vitamin D (CALCIUM + D PO) Take 600 mg by mouth daily.    Yes [provider]  Cyanocobalamin (VITAMIN B 12 PO) Take 0.5-1 mg by mouth daily.   Yes [provider]  fluticasone (FLONASE) 50 MCG/ACT nasal spray Place 2 sprays into the nose daily as needed for allergies.  10/16/14  Yes [provider]  gabapentin (NEURONTIN) 300 MG capsule Take 1-2 capsules TID. 08/03/17  Yes Marcial Pacas, MD  HYDROcodone-acetaminophen (NORCO/VICODIN) 5-325 MG per tablet Take 1 tablet by mouth at bedtime.    Yes [provider]  levothyroxine (SYNTHROID, LEVOTHROID) 125 MCG tablet daily. 06/24/16  Yes [provider]  Magnesium 400 MG TABS Take 400 mg by mouth daily.   Yes [provider]  metoprolol succinate (TOPROL-XL) 25 MG 24 hr tablet Take 12.5 mg by mouth 2 (two) times daily.    Yes [provider]  omeprazole (PRILOSEC) 20 MG capsule Take 20 mg by mouth daily.  02/06/13  Yes [provider]  pravastatin (PRAVACHOL) 20 MG tablet Take 20  mg by mouth daily.   Yes Christain Sacramento, MD  warfarin (COUMADIN) 5 MG tablet Take 1 tablet (5 mg total) by mouth daily for 1 dose. 06/20/17 08/01/17  Annia Belt, MD   Past Medical History:  Diagnosis Date  . Anxiety    severe panic attacks  . Arthritis   . Aseptic meningitis   . Asthma    as child  . Breast cancer ([place])    right  . Chronic anticoagulation 08/04/2015  . Collapsed lung    hx of, left  . Complication of anesthesia    "hard to put asleep"  . Depression   . Fibromyalgia   . GERD (gastroesophageal reflux disease)   . H/O hiatal hernia   . Headache(784.0)   . Hepatitis 1990   "from eating at restaurant"  . History of antiphospholipid  antibody syndrome   . 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  . Hx-TIA (transient ischemic attack)   . Hyperlipidemia   . Hypertension   . Hypothyroidism   . Neuromuscular disorder ([place])   . Pneumonia    hx of  . Soft tissue mass 06/14/2017   3 cm right post axilla same side as previous breast cancer 06/14/17  . Stroke ([place])   . 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 Marital status: Single    Spouse name: Not on file  . Number of children: 2  . Years of education: 22  . Highest education level: Not on file  Social Needs  . Financial resource strain: Not on file  . Food insecurity - worry: Not on file  . Food insecurity - inability: Not on file  . Transportation needs - medical: Not on file  . Transportation needs - non-medical: Not on file  Occupational History  . Not on file  Tobacco Use  . Smoking status: Never Smoker  . Smokeless tobacco: Never Used  Substance and Sexual Activity  . Alcohol use: No    Alcohol/week: 0.0 oz  . Drug use: No  . Sexual activity: Not on file  Other Topics Concern  . Not on file  Social History Narrative   Lives at home with her mother.  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   Right-handed.   2-4 cups caffeine daily.   Family History  Problem Relation Age of Onset  . Alzheimer's disease Mother   . Heart disease Mother   . Hyperlipidemia Mother   . Hypertension Mother   . Cancer Maternal Grandmother        colon  . Heart disease Sister   . Hypertension Sister   . Heart attack Sister   . Heart disease Brother   . Heart attack Brother   . Cancer Daughter   . Hypertension Daughter   . Hypertension Son     ASSESSMENT Recent Results: The most recent result is correlated with 40 mg per week: Lab Results  Component Value Date   INR 2.60 09/26/2017   INR 1.70  09/05/2017   INR 2.50 08/08/2017   PROTIME 30.0 (H) 03/13/2015    Anticoagulation Dosing: Description   DR [id number] PATIENT (route warfarin notes and list as authorizing provider for LOS & Follow-up, lab orders and warfarin prescriptions), do not list attending physician.  Take one (1) tablet of your 5mg  peach-colored warfarin  tablets by mouth, once-daily, at [facility] each day--EXCEPT on MONDAYS and THURSDAYS, take 1 & 1/2 tablets on Mondays and Thursdays.      INR today: Therapeutic  PLAN Weekly dose was unchanged.  Patient Instructions  Patient instructed to take medications as defined in the Anti-coagulation Track section of this encounter.  Patient instructed to take today's dose.  Patient instructed to take one (1) tablet of your 5mg  peach-colored warfarin tablets by mouth, once-daily, at [facility] each day--EXCEPT on MONDAYS and THURSDAYS, take 1 & 1/2 tablets on Mondays and Thursdays. Patient verbalized understanding of these instructions.     Patient advised to contact clinic or seek medical attention if signs/symptoms of bleeding or thromboembolism occur.  Patient verbalized understanding by repeating back information and was advised to contact me if further medication-related questions arise. Patient was also provided an information handout.  Follow-up Return in 4 weeks (on 10/24/2017) for Follow up INR at 1445h.  Pennie Banter, PharmD, CACP, CPP  15 minutes spent face-to-face with the patient during the encounter. 50% of time spent on education. 50% of time was spent on fingerstick point of care INR sample collection, processing, results determination and documentation in [email or web address].

## 2017-10-20 ENCOUNTER — Telehealth: Payer: Self-pay | Admitting: Family Medicine

## 2017-10-20 ENCOUNTER — Other Ambulatory Visit: Payer: Self-pay | Admitting: Neurology

## 2017-10-20 NOTE — Telephone Encounter (Signed)
Patient is wanting to let Holley Raring know she has the flu and she is taking theraflu, she wanted the nurse to know since she is on blood thinner

## 2017-10-20 NOTE — Telephone Encounter (Signed)
Pt called / informed ok to take Theraflu per Dr Beryle Beams. And she said she has an appt on Monday to Dr Elie Confer to check INR.

## 2017-10-20 NOTE — Telephone Encounter (Signed)
OK to take theraflu

## 2017-10-20 NOTE — Telephone Encounter (Signed)
I will information to Dr Beryle Beams and Dr Elie Confer.

## 2017-10-24 ENCOUNTER — Ambulatory Visit (INDEPENDENT_AMBULATORY_CARE_PROVIDER_SITE_OTHER): Payer: Medicare HMO | Admitting: Pharmacist

## 2017-10-24 ENCOUNTER — Other Ambulatory Visit: Payer: Self-pay | Admitting: Cardiology

## 2017-10-24 DIAGNOSIS — D6861 Antiphospholipid syndrome: Secondary | ICD-10-CM

## 2017-10-24 DIAGNOSIS — R760 Raised antibody titer: Secondary | ICD-10-CM

## 2017-10-24 DIAGNOSIS — Z7901 Long term (current) use of anticoagulants: Secondary | ICD-10-CM | POA: Diagnosis not present

## 2017-10-24 DIAGNOSIS — Z5181 Encounter for therapeutic drug level monitoring: Secondary | ICD-10-CM | POA: Diagnosis not present

## 2017-10-24 DIAGNOSIS — I6523 Occlusion and stenosis of bilateral carotid arteries: Secondary | ICD-10-CM

## 2017-10-24 DIAGNOSIS — R0989 Other specified symptoms and signs involving the circulatory and respiratory systems: Secondary | ICD-10-CM

## 2017-10-24 LAB — POCT INR: INR: 2.1

## 2017-10-24 NOTE — Patient Instructions (Signed)
Patient instructed to take medications as defined in the Anti-coagulation Track section of this encounter.  Patient instructed to take today's dose.  Patient instructed to take  one (1) tablet of your 5mg  peach-colored warfarin tablets on Mondays, Wednesdays and Fridays. All other days, take one and one-half (1 & 1/2) tablets of your 5mg  peach-colored warfarin tablets. Patient verbalized understanding of these instructions.

## 2017-10-24 NOTE — Progress Notes (Signed)
Anticoagulation Management [name] is a 72 y.o. female who reports to the clinic for monitoring of warfarin treatment.    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    Duration: indefinite Supervising physician: Murriel Hopper  Anticoagulation Clinic Visit History: Patient does not report signs/symptoms of bleeding or thromboembolism  Other recent changes: No diet, medications, lifestyle changes endorsed by the patient at this visit.  Anticoagulation Episode Summary    Current INR goal:   2.0-3.0  TTR:   69.6 % (6.3 y)  Next INR check:   11/21/2017  INR from last check:   2.10 (10/24/2017)  Weekly max warfarin dose:     Target end date:     INR check location:     Preferred lab:     Send INR reminders to:      Indications   Antiphospholipid antibody syndrome (HCC) [D68.61] Lupus anticoagulant positive [R76.0]       Comments:         Anticoagulation Care Providers    Provider Role Specialty Phone number   Annia Belt, MD Referring Oncology [phone]      Allergies  Allergen Reactions  . Bee Venom Anaphylaxis  . Hornet Venom Anaphylaxis    Anaphylaxis Shock SHOCK  . Reglan [Metoclopramide] Shortness Of Breath and Other (See Comments)    Tongue got numb; didn't feel good  . Vitamin K Anaphylaxis and Rash    "kills me" iv  . Benadryl [Diphenhydramine Hcl]     Rash  . Crestor [Rosuvastatin Calcium]     Muscle Aches  . Cymbalta [Duloxetine Hcl] Swelling    Swelling of tongue and thrush  . Doxycycline     Tongue burning, thrush  . Levofloxacin     Rash  . Zocor [Simvastatin]     Muscle Aches  . Ciprofloxacin Rash  . Daypro [Oxaprozin] Rash    Rash  . Phytonadione Rash  . Sulfonamide Derivatives Rash   Prior to Admission medications   Medication Sig Start Date End Date Taking? Authorizing Provider  ALPRAZolam [name]) 0.5 MG tablet Take 0.5 mg  by mouth 2 (two) times daily as needed for sleep. Every evening and sometimes during the day if needed 06/07/12  Yes [provider]  aspirin 81 MG tablet Take 81 mg by mouth daily at 6 PM.    Yes [provider]  Calcium Carbonate-Vitamin D (CALCIUM + D PO) Take 600 mg by mouth daily.    Yes [provider]  Cyanocobalamin (VITAMIN B 12 PO) Take 0.5-1 mg by mouth daily.   Yes [provider]  fluticasone (FLONASE) 50 MCG/ACT nasal spray Place 2 sprays into the nose daily as needed for allergies.  10/16/14  Yes [provider]  gabapentin (NEURONTIN) 300 MG capsule TAKE 1 TO 2 CAPSULES THREE TIMES DAILY 10/21/17  Yes Marcial Pacas, MD  HYDROcodone-acetaminophen (NORCO/VICODIN) 5-325 MG per tablet Take 1 tablet by mouth at bedtime.    Yes [provider]  levothyroxine (SYNTHROID, LEVOTHROID) 125 MCG tablet daily. 06/24/16  Yes [provider]  Magnesium 400 MG TABS Take 400 mg by mouth daily.   Yes [provider]  metoprolol succinate (TOPROL-XL) 25 MG 24 hr tablet Take 12.5 mg by mouth 2 (two) times daily.    Yes [provider]  omeprazole (PRILOSEC) 20 MG capsule Take 20 mg by mouth daily.  02/06/13  Yes [provider]  pravastatin (PRAVACHOL) 20  MG tablet Take 20 mg by mouth daily.   Yes Christain Sacramento, MD  warfarin (COUMADIN) 5 MG tablet Take 1 tablet (5 mg total) by mouth daily for 1 dose. 06/20/17 08/01/17  Annia Belt, MD   Past Medical History:  Diagnosis Date  . Anxiety    severe panic attacks  . Arthritis   . Aseptic meningitis   . Asthma    as child  . Breast cancer ([place])    right  . Chronic anticoagulation 08/04/2015  . Collapsed lung    hx of, left  . Complication of anesthesia    "hard to put asleep"  . Depression   . Fibromyalgia   . GERD (gastroesophageal reflux disease)   . H/O hiatal hernia   . Headache(784.0)   . Hepatitis 1990   "from eating at restaurant"  . History of  antiphospholipid antibody syndrome   . 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  . Hx-TIA (transient ischemic attack)   . Hyperlipidemia   . Hypertension   . Hypothyroidism   . Neuromuscular disorder ([place])   . Pneumonia    hx of  . Soft tissue mass 06/14/2017   3 cm right post axilla same side as previous breast cancer 06/14/17  . Stroke ([place])   . 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 Marital status: Single    Spouse name: Not on file  . Number of children: 2  . Years of education: 21  . Highest education level: Not on file  Occupational History  . Not on file  Social Needs  . Financial resource strain: Not on file  . Food insecurity:    Worry: Not on file    Inability: Not on file  . Transportation needs:    Medical: Not on file    Non-medical: Not on file  Tobacco Use  . Smoking status: Never Smoker  . Smokeless tobacco: Never Used  Substance and Sexual Activity  . Alcohol use: No    Alcohol/week: 0.0 oz  . Drug use: No  . Sexual activity: Not on file  Lifestyle  . Physical activity:    Days per week: Not on file    Minutes per session: Not on file  . Stress: Not on file  Relationships  . Social connections:    Talks on phone: Not on file    Gets together: Not on file    Attends religious service: Not on file    Active member of club or organization: Not on file    Attends meetings of clubs or organizations: Not on file    Relationship status: Not on file  Other Topics Concern  . Not on file  Social History Narrative   Lives at home with her mother.  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   Right-handed.   2-4 cups caffeine daily.   Family History  Problem Relation Age of Onset  . Alzheimer's disease Mother   . Heart disease Mother   . Hyperlipidemia Mother   . Hypertension Mother    . Cancer Maternal Grandmother        colon  . Heart disease Sister   . Hypertension Sister   . Heart attack Sister   . Heart disease Brother   . Heart attack Brother   . Cancer Daughter   .  Hypertension Daughter   . Hypertension Son     ASSESSMENT Recent Results: The most recent result is correlated with 40 mg per week: Lab Results  Component Value Date   INR 2.10 10/24/2017   INR 2.60 09/26/2017   INR 1.70 09/05/2017   PROTIME 30.0 (H) 03/13/2015    Anticoagulation Dosing: Description   DR [id number] PATIENT (route warfarin notes and list as authorizing provider for LOS & Follow-up, lab orders and warfarin prescriptions), do not list attending physician.  Take one (1) tablet of your 5mg  peach-colored warfarin tablets on Mondays, Wednesdays and Fridays. All other days, take one and one-half (1 & 1/2) tablets of your 5mg  peach-colored warfarin tablets.      INR today: Therapeutic  PLAN Weekly dose was increased by 6% to 42.5 mg per week  Patient Instructions  Patient instructed to take medications as defined in the Anti-coagulation Track section of this encounter.  Patient instructed to take today's dose.  Patient instructed to take  one (1) tablet of your 5mg  peach-colored warfarin tablets on Mondays, Wednesdays and Fridays. All other days, take one and one-half (1 & 1/2) tablets of your 5mg  peach-colored warfarin tablets. Patient verbalized understanding of these instructions.     Patient advised to contact clinic or seek medical attention if signs/symptoms of bleeding or thromboembolism occur.  Patient verbalized understanding by repeating back information and was advised to contact me if further medication-related questions arise. Patient was also provided an information handout.  Follow-up Return in 1 month (on 11/21/2017) for Follow up INR at 2:15PM.  Pennie Banter, PharmD, CACP, CPP  15 minutes spent face-to-face with the patient during the encounter. 50%  of time spent on education. 50% of time was spent on fingerstick point of care INR sample collection, processing, results determination, dose change and documentation in [email or web address].

## 2017-10-25 NOTE — Progress Notes (Signed)
Reviewed thx DrG 

## 2017-11-01 ENCOUNTER — Telehealth: Payer: Self-pay | Admitting: Family Medicine

## 2017-11-01 ENCOUNTER — Other Ambulatory Visit: Payer: Self-pay | Admitting: Cardiology

## 2017-11-01 DIAGNOSIS — I6523 Occlusion and stenosis of bilateral carotid arteries: Secondary | ICD-10-CM

## 2017-11-01 NOTE — Telephone Encounter (Signed)
Patient accidentally took her mother medicine with is Pioglitazone 15mg , can she please have a nurse to call her back

## 2017-11-02 NOTE — Telephone Encounter (Signed)
Called pt - no answer; left message to give us a call back. 

## 2017-11-02 NOTE — Telephone Encounter (Signed)
Noted thx DrG 

## 2017-11-02 NOTE — Telephone Encounter (Signed)
Pt stated she took Pioglitazone (1 pill) of her mother's medication by mistake and wanted u to know. And stated she feels fine today.

## 2017-11-21 ENCOUNTER — Ambulatory Visit (INDEPENDENT_AMBULATORY_CARE_PROVIDER_SITE_OTHER): Payer: Medicare HMO | Admitting: Pharmacist

## 2017-11-21 DIAGNOSIS — R760 Raised antibody titer: Secondary | ICD-10-CM

## 2017-11-21 DIAGNOSIS — Z5181 Encounter for therapeutic drug level monitoring: Secondary | ICD-10-CM | POA: Diagnosis not present

## 2017-11-21 DIAGNOSIS — D6861 Antiphospholipid syndrome: Secondary | ICD-10-CM | POA: Diagnosis not present

## 2017-11-21 DIAGNOSIS — Z7901 Long term (current) use of anticoagulants: Secondary | ICD-10-CM

## 2017-11-21 LAB — POCT INR: INR: 2.7

## 2017-11-21 NOTE — Progress Notes (Signed)
Reviewed thx DrG 

## 2017-11-21 NOTE — Progress Notes (Signed)
Anticoagulation Management [name] is a 72 y.o. female who reports to the clinic for monitoring of warfarin treatment.    Indication: Antiphospholipid antibody syndrome (HCC) [D68.61], Lupus anticoagulant positive [R76.0]; long term current use of anticoagulant.    Duration: indefinite Supervising physician: Murriel Hopper  Anticoagulation Clinic Visit History: Patient does not report signs/symptoms of bleeding or thromboembolism  Other recent changes: No diet, medications, lifestyle changes endorsed by the patient to me at this visit.  Anticoagulation Episode Summary    Current INR goal:   2.0-3.0  TTR:   70.0 % (6.4 y)  Next INR check:   12/26/2017  INR from last check:   2.70 (11/21/2017)  Weekly max warfarin dose:     Target end date:     INR check location:     Preferred lab:     Send INR reminders to:      Indications   Antiphospholipid antibody syndrome (HCC) [D68.61] Lupus anticoagulant positive [R76.0]       Comments:         Anticoagulation Care Providers    Provider Role Specialty Phone number   Annia Belt, MD Referring Oncology [phone]      Allergies  Allergen Reactions  . Bee Venom Anaphylaxis  . Hornet Venom Anaphylaxis    Anaphylaxis Shock SHOCK  . Reglan [Metoclopramide] Shortness Of Breath and Other (See Comments)    Tongue got numb; didn't feel good  . Vitamin K Anaphylaxis and Rash    "kills me" iv  . Benadryl [Diphenhydramine Hcl]     Rash  . Crestor [Rosuvastatin Calcium]     Muscle Aches  . Cymbalta [Duloxetine Hcl] Swelling    Swelling of tongue and thrush  . Doxycycline     Tongue burning, thrush  . Levofloxacin     Rash  . Zocor [Simvastatin]     Muscle Aches  . Ciprofloxacin Rash  . Daypro [Oxaprozin] Rash    Rash  . Phytonadione Rash  . Sulfonamide Derivatives Rash   Prior to Admission medications   Medication Sig Start Date End Date Taking? Authorizing Provider  ALPRAZolam [name]) 0.5 MG tablet Take  0.5 mg by mouth 2 (two) times daily as needed for sleep. Every evening and sometimes during the day if needed 06/07/12  Yes [provider]  aspirin 81 MG tablet Take 81 mg by mouth daily at 6 PM.    Yes [provider]  Calcium Carbonate-Vitamin D (CALCIUM + D PO) Take 600 mg by mouth daily.    Yes [provider]  Cyanocobalamin (VITAMIN B 12 PO) Take 0.5-1 mg by mouth daily.   Yes [provider]  fluticasone (FLONASE) 50 MCG/ACT nasal spray Place 2 sprays into the nose daily as needed for allergies.  10/16/14  Yes [provider]  gabapentin (NEURONTIN) 300 MG capsule TAKE 1 TO 2 CAPSULES THREE TIMES DAILY 10/21/17  Yes Marcial Pacas, MD  HYDROcodone-acetaminophen (NORCO/VICODIN) 5-325 MG per tablet Take 1 tablet by mouth at bedtime.    Yes [provider]  levothyroxine (SYNTHROID, LEVOTHROID) 125 MCG tablet daily. 06/24/16  Yes [provider]  Magnesium 400 MG TABS Take 400 mg by mouth daily.   Yes [provider]  metoprolol succinate (TOPROL-XL) 25 MG 24 hr tablet Take 12.5 mg by mouth 2 (two) times daily.    Yes [provider]  omeprazole (PRILOSEC) 20 MG capsule Take 20 mg by mouth daily.  02/06/13  Yes [provider]  pravastatin (  PRAVACHOL) 20 MG tablet Take 20 mg by mouth daily.   Yes Christain Sacramento, MD  warfarin (COUMADIN) 5 MG tablet Take 1 tablet (5 mg total) by mouth daily for 1 dose. 06/20/17 08/01/17  Annia Belt, MD   Past Medical History:  Diagnosis Date  . Anxiety    severe panic attacks  . Arthritis   . Aseptic meningitis   . Asthma    as child  . Breast cancer ([place])    right  . Chronic anticoagulation 08/04/2015  . Collapsed lung    hx of, left  . Complication of anesthesia    "hard to put asleep"  . Depression   . Fibromyalgia   . GERD (gastroesophageal reflux disease)   . H/O hiatal hernia   . Headache(784.0)   . Hepatitis 1990   "from eating at restaurant"  .  History of antiphospholipid antibody syndrome   . 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  . Hx-TIA (transient ischemic attack)   . Hyperlipidemia   . Hypertension   . Hypothyroidism   . Neuromuscular disorder ([place])   . Pneumonia    hx of  . Soft tissue mass 06/14/2017   3 cm right post axilla same side as previous breast cancer 06/14/17  . Stroke ([place])   . 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 Marital status: Single    Spouse name: Not on file  . Number of children: 2  . Years of education: 55  . Highest education level: Not on file  Occupational History  . Not on file  Social Needs  . Financial resource strain: Not on file  . Food insecurity:    Worry: Not on file    Inability: Not on file  . Transportation needs:    Medical: Not on file    Non-medical: Not on file  Tobacco Use  . Smoking status: Never Smoker  . Smokeless tobacco: Never Used  Substance and Sexual Activity  . Alcohol use: No    Alcohol/week: 0.0 oz  . Drug use: No  . Sexual activity: Not on file  Lifestyle  . Physical activity:    Days per week: Not on file    Minutes per session: Not on file  . Stress: Not on file  Relationships  . Social connections:    Talks on phone: Not on file    Gets together: Not on file    Attends religious service: Not on file    Active member of club or organization: Not on file    Attends meetings of clubs or organizations: Not on file    Relationship status: Not on file  Other Topics Concern  . Not on file  Social History Narrative   Lives at home with her mother.  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   Right-handed.   2-4 cups caffeine daily.   Family History  Problem Relation Age of Onset  . Alzheimer's disease Mother   . Heart disease Mother   . Hyperlipidemia Mother   .  Hypertension Mother   . Cancer Maternal Grandmother        colon  . Heart disease Sister   . Hypertension Sister   . Heart attack Sister   . Heart disease Brother   . Heart attack Brother   .  Cancer Daughter   . Hypertension Daughter   . Hypertension Son     ASSESSMENT Recent Results: The most recent result is correlated with 42.5 mg per week: Lab Results  Component Value Date   INR 2.70 11/21/2017   INR 2.10 10/24/2017   INR 2.60 09/26/2017   PROTIME 30.0 (H) 03/13/2015    Anticoagulation Dosing: Description   DR [id number] PATIENT (route warfarin notes and list as authorizing provider for LOS & Follow-up, lab orders and warfarin prescriptions), do not list attending physician.  Take one (1) of your 5mg  peach-colored warfarin tablets on Mondays, Wednesdays and Fridays. All other days, take one-and one-half (1 & 1/2) of your 5mg  peach-colored warfarin tablets.      INR today: Therapeutic  PLAN Weekly dose was unchanged.  Patient Instructions  Patient instructed to take medications as defined in the Anti-coagulation Track section of this encounter.  Patient instructed to take today's dose.  Patient instructed to take one (1) of your 5mg  peach-colored warfarin tablets on Mondays, Wednesdays and Fridays. All other days, take one-and one-half (1 & 1/2) of your 5mg  peach-colored warfarin tablets. Patient verbalized understanding of these instructions.     Patient advised to contact clinic or seek medical attention if signs/symptoms of bleeding or thromboembolism occur.  Patient verbalized understanding by repeating back information and was advised to contact me if further medication-related questions arise. Patient was also provided an information handout.  Follow-up Return in about 5 weeks (around 12/26/2017) for Follow up INR at 2:15PM.  Pennie Banter, PharmD, CACP, CPP  15 minutes spent face-to-face with the patient during the encounter. 50% of time spent on  education. 50% of time was spent on fingerstick point of care INR sample collection, processing, results determination and documentation in [email or web address].

## 2017-11-21 NOTE — Patient Instructions (Signed)
Patient instructed to take medications as defined in the Anti-coagulation Track section of this encounter.  Patient instructed to take today's dose.  Patient instructed to take one (1) of your 5mg  peach-colored warfarin tablets on Mondays, Wednesdays and Fridays. All other days, take one-and one-half (1 & 1/2) of your 5mg  peach-colored warfarin tablets. Patient verbalized understanding of these instructions.

## 2017-11-29 ENCOUNTER — Ambulatory Visit (HOSPITAL_COMMUNITY)
Admission: RE | Admit: 2017-11-29 | Payer: Medicare HMO | Source: Ambulatory Visit | Attending: Cardiology | Admitting: Cardiology

## 2017-12-26 ENCOUNTER — Ambulatory Visit (INDEPENDENT_AMBULATORY_CARE_PROVIDER_SITE_OTHER): Payer: Medicare HMO | Admitting: Pharmacist

## 2017-12-26 DIAGNOSIS — D6861 Antiphospholipid syndrome: Secondary | ICD-10-CM

## 2017-12-26 DIAGNOSIS — Z7901 Long term (current) use of anticoagulants: Secondary | ICD-10-CM

## 2017-12-26 DIAGNOSIS — R760 Raised antibody titer: Secondary | ICD-10-CM | POA: Diagnosis not present

## 2017-12-26 LAB — POCT INR: INR: 4.9 — AB (ref 2.0–3.0)

## 2017-12-26 NOTE — Patient Instructions (Signed)
Patient instructed to take medications as defined in the Anti-coagulation Track section of this encounter.  Patient instructed to OMIT today's dose.  Patient instructed to take one (1) of your 5mg  peach-colored warfarin tablets DAILY, EXCEPT on Wednesdays take one-and one-half (1 & 1/2) of your 5mg  peach-colored warfarin tablets.  Patient verbalized understanding of these instructions.

## 2017-12-26 NOTE — Progress Notes (Signed)
Anticoagulation Management [name] is a 72 y.o. female who reports to the clinic for monitoring of warfarin treatment.    Indication: Antiphospholipid antibody syndrome, Lupus anticoagulant positive, Long term current use of anticoagulant.   Duration: indefinite Supervising physician: Murriel Hopper  Anticoagulation Clinic Visit History: Patient does not report signs/symptoms of bleeding or thromboembolism  Other recent changes: No diet, medications, lifestyle changes endorsed by the patient to me at this visit.  Anticoagulation Episode Summary    Current INR goal:   2.0-3.0  TTR:   69.2 % (6.5 y)  Next INR check:   12/26/2017  INR from last check:     Weekly max warfarin dose:     Target end date:     INR check location:     Preferred lab:     Send INR reminders to:      Indications   Antiphospholipid antibody syndrome (HCC) [D68.61] Lupus anticoagulant positive [R76.0]       Comments:         Anticoagulation Care Providers    Provider Role Specialty Phone number   Annia Belt, MD Referring Oncology [phone]      Allergies  Allergen Reactions  . Bee Venom Anaphylaxis  . Hornet Venom Anaphylaxis    Anaphylaxis Shock SHOCK  . Reglan [Metoclopramide] Shortness Of Breath and Other (See Comments)    Tongue got numb; didn't feel good  . Vitamin K Anaphylaxis and Rash    "kills me" iv  . Benadryl [Diphenhydramine Hcl]     Rash  . Crestor [Rosuvastatin Calcium]     Muscle Aches  . Cymbalta [Duloxetine Hcl] Swelling    Swelling of tongue and thrush  . Doxycycline     Tongue burning, thrush  . Levofloxacin     Rash  . Zocor [Simvastatin]     Muscle Aches  . Ciprofloxacin Rash  . Daypro [Oxaprozin] Rash    Rash  . Phytonadione Rash  . Sulfonamide Derivatives Rash   Prior to Admission medications   Medication Sig Start Date End Date Taking? Authorizing Provider  ALPRAZolam [name]) 0.5 MG tablet Take 0.5 mg by mouth 2 (two) times daily as  needed for sleep. Every evening and sometimes during the day if needed 06/07/12  Yes [provider]  aspirin 81 MG tablet Take 81 mg by mouth daily at 6 PM.    Yes [provider]  Calcium Carbonate-Vitamin D (CALCIUM + D PO) Take 600 mg by mouth daily.    Yes [provider]  Cyanocobalamin (VITAMIN B 12 PO) Take 0.5-1 mg by mouth daily.   Yes [provider]  fluticasone (FLONASE) 50 MCG/ACT nasal spray Place 2 sprays into the nose daily as needed for allergies.  10/16/14  Yes [provider]  gabapentin (NEURONTIN) 300 MG capsule TAKE 1 TO 2 CAPSULES THREE TIMES DAILY 10/21/17  Yes Marcial Pacas, MD  HYDROcodone-acetaminophen (NORCO/VICODIN) 5-325 MG per tablet Take 1 tablet by mouth at bedtime.    Yes [provider]  levothyroxine (SYNTHROID, LEVOTHROID) 125 MCG tablet daily. 06/24/16  Yes [provider]  Magnesium 400 MG TABS Take 400 mg by mouth daily.   Yes [provider]  metoprolol succinate (TOPROL-XL) 25 MG 24 hr tablet Take 12.5 mg by mouth 2 (two) times daily.    Yes [provider]  omeprazole (PRILOSEC) 20 MG capsule Take 20 mg by mouth daily.  02/06/13  Yes [provider]  pravastatin (PRAVACHOL) 20 MG tablet Take  20 mg by mouth daily.   Yes Christain Sacramento, MD  warfarin (COUMADIN) 5 MG tablet Take 1 tablet (5 mg total) by mouth daily for 1 dose. 06/20/17 08/01/17  Annia Belt, MD   Past Medical History:  Diagnosis Date  . Anxiety    severe panic attacks  . Arthritis   . Aseptic meningitis   . Asthma    as child  . Breast cancer ([place])    right  . Chronic anticoagulation 08/04/2015  . Collapsed lung    hx of, left  . Complication of anesthesia    "hard to put asleep"  . Depression   . Fibromyalgia   . GERD (gastroesophageal reflux disease)   . H/O hiatal hernia   . Headache(784.0)   . Hepatitis 1990   "from eating at restaurant"  . History of antiphospholipid antibody  syndrome   . 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  . Hx-TIA (transient ischemic attack)   . Hyperlipidemia   . Hypertension   . Hypothyroidism   . Neuromuscular disorder ([place])   . Pneumonia    hx of  . Soft tissue mass 06/14/2017   3 cm right post axilla same side as previous breast cancer 06/14/17  . Stroke ([place])   . 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 Marital status: Single    Spouse name: Not on file  . Number of children: 2  . Years of education: 35  . Highest education level: Not on file  Occupational History  . Not on file  Social Needs  . Financial resource strain: Not on file  . Food insecurity:    Worry: Not on file    Inability: Not on file  . Transportation needs:    Medical: Not on file    Non-medical: Not on file  Tobacco Use  . Smoking status: Never Smoker  . Smokeless tobacco: Never Used  Substance and Sexual Activity  . Alcohol use: No    Alcohol/week: 0.0 oz  . Drug use: No  . Sexual activity: Not on file  Lifestyle  . Physical activity:    Days per week: Not on file    Minutes per session: Not on file  . Stress: Not on file  Relationships  . Social connections:    Talks on phone: Not on file    Gets together: Not on file    Attends religious service: Not on file    Active member of club or organization: Not on file    Attends meetings of clubs or organizations: Not on file    Relationship status: Not on file  Other Topics Concern  . Not on file  Social History Narrative   Lives at home with her mother.  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   Right-handed.   2-4 cups caffeine daily.   Family History  Problem Relation Age of Onset  . Alzheimer's disease Mother   . Heart disease Mother   . Hyperlipidemia Mother   . Hypertension Mother   . Cancer Maternal  Grandmother        colon  . Heart disease Sister   . Hypertension Sister   . Heart attack Sister   . Heart disease Brother   . Heart attack Brother   . Cancer Daughter   .  Hypertension Daughter   . Hypertension Son     ASSESSMENT Recent Results: The most recent result is correlated with 42.5 mg per week: Lab Results  Component Value Date   INR 4.9 (A) 12/26/2017   INR 2.70 11/21/2017   INR 2.10 10/24/2017   PROTIME 30.0 (H) 03/13/2015    Anticoagulation Dosing: Description   DR [id number]'E PATIENT (route warfarin notes and list as authorizing provider for LOS & Follow-up, lab orders and warfarin prescriptions), do not list attending physician.  Take one (1) of your 5mg  peach-colored warfarin tablets DAILY, EXCEPT on Wednesdays take one-and one-half (1 & 1/2) of your 5mg  peach-colored warfarin tablets.      INR today: Supratherapeutic  PLAN Weekly dose was decreased by 12%to 37.5mg  per week  Patient Instructions  Patient instructed to take medications as defined in the Anti-coagulation Track section of this encounter.  Patient instructed to OMIT today's dose.  Patient instructed to take one (1) of your 5mg  peach-colored warfarin tablets DAILY, EXCEPT on Wednesdays take one-and one-half (1 & 1/2) of your 5mg  peach-colored warfarin tablets.  Patient verbalized understanding of these instructions.     Patient advised to contact clinic or seek medical attention if signs/symptoms of bleeding or thromboembolism occur.  Patient verbalized understanding by repeating back information and was advised to contact me if further medication-related questions arise. Patient was also provided an information handout.  Follow-up Return in 1 month (on 01/23/2018) for Follow up INR at 2:15PM.  Pennie Banter, PharmD, CACP, CPP  15 minutes spent face-to-face with the patient during the encounter. 50% of time spent on education. 50% of time was spent on fingerstick point of care INR sample  collection, processing, results determination, dose adjustment and documentation in [email or web address].

## 2017-12-26 NOTE — Progress Notes (Signed)
Reviewed  INR 4.7 Rec Hold 1-2 doses. Short interim follow up lab DrG

## 2017-12-27 ENCOUNTER — Encounter (INDEPENDENT_AMBULATORY_CARE_PROVIDER_SITE_OTHER): Payer: Self-pay | Admitting: Orthopedic Surgery

## 2017-12-27 ENCOUNTER — Ambulatory Visit (INDEPENDENT_AMBULATORY_CARE_PROVIDER_SITE_OTHER): Payer: Medicare HMO | Admitting: Orthopedic Surgery

## 2017-12-27 DIAGNOSIS — M1712 Unilateral primary osteoarthritis, left knee: Secondary | ICD-10-CM | POA: Diagnosis not present

## 2017-12-27 DIAGNOSIS — M1711 Unilateral primary osteoarthritis, right knee: Secondary | ICD-10-CM | POA: Diagnosis not present

## 2017-12-27 DIAGNOSIS — M170 Bilateral primary osteoarthritis of knee: Secondary | ICD-10-CM | POA: Insufficient documentation

## 2017-12-27 NOTE — Progress Notes (Signed)
Office Visit Note   Patient: [name]           Date of Birth: [birth year]           MRN: [id number] Visit Date: 12/27/2017              Requested by: [name]., PA-C [address], [place] [address] PCP: [name]., PA-C  Chief Complaint  Patient presents with  . Left Knee - Pain  . Right Knee - Pain      HPI: Patient is a 72 year old woman with osteoarthritis bilateral knees.  Patient states she is been having increasing pain with activities of daily living denies any mechanical catching or locking.  Patient's INR is 4.9 she did hold the dose of Coumadin.  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  Patient complains of pain and popping in her knees with walking.  Assessment & Plan: Visit Diagnoses:  1. Bilateral primary osteoarthritis of knee     Plan: Both knees were injected she tolerated this well recommended that she use knee-high medical compression stockings to decrease her risk of DVT with her hypercoagulable autoimmune disease.  Follow-Up Instructions: Return if symptoms worsen or fail to improve.   Ortho Exam  Patient is alert, oriented, no adenopathy, well-dressed, normal affect, normal respiratory effort. Examination patient has normal gait.  There is a mild effusion of both knees Clauser cruciates are stable bilaterally she is tender to palpation of the medial lateral joint lines.  There is crepitation range of motion of both knees.  There is no ecchymosis or bruising in either leg.  Imaging: No results found. No images are attached to the encounter.  Labs: Lab Results  Component Value Date   HGBA1C 6.3 (H) 06/28/2017   HGBA1C 6.0 (H) 04/14/2016   ESRSEDRATE 5 06/28/2017   ESRSEDRATE 11 04/18/2016   ESRSEDRATE 11 11/04/2009   CRP 3.6 06/28/2017   CRP 1.3 (H) 04/18/2016   REPTSTATUS 04/20/2016 FINAL 04/16/2016   GRAMSTAIN  04/16/2016    CYTOSPIN SMEAR WBC PRESENT, PREDOMINANTLY MONONUCLEAR NO ORGANISMS  SEEN    CULT NO GROWTH 3 DAYS 04/16/2016     Lab Results  Component Value Date   ALBUMIN 4.1 05/23/2017   ALBUMIN 4.6 01/31/2017   ALBUMIN 4.0 08/02/2016    There is no height or weight on file to calculate BMI.  Orders:  No orders of the defined types were placed in this encounter.  No orders of the defined types were placed in this encounter.    Procedures: Large Joint Inj: bilateral knee on 12/27/2017 3:12 PM Indications: pain and diagnostic evaluation Details: 22 G 1.5 in needle, anteromedial approach  Arthrogram: No  Outcome: tolerated well, no immediate complications Procedure, treatment alternatives, risks and benefits explained, specific risks discussed. Consent was given by the patient. Immediately prior to procedure a time out was called to verify the correct patient, procedure, equipment, support staff and site/side marked as required. Patient was prepped and draped in the usual sterile fashion.      Clinical Data: No additional findings.  ROS:  All other systems negative, except as noted in the HPI. Review of Systems  Objective: Vital Signs: There were no vitals taken for this visit.  Specialty Comments:  No specialty comments available.  PMFS History: Patient Active Problem List   Diagnosis Date Noted  . Bilateral primary osteoarthritis of knee 12/27/2017  . Acute infection of nasal sinus 07/21/2017  . Prediabetes 06/29/2017  .  Soft tissue mass 06/14/2017  . Displaced fracture of fifth metatarsal bone of right foot 02/07/2017  . Unilateral primary osteoarthritis, left knee 11/26/2016  . Unilateral primary osteoarthritis, right knee 11/26/2016  . Left leg pain   . Abnormal MRI of head   . Vision changes 04/13/2016  . Orthostatic hypotension 03/10/2016  . Vertigo 03/10/2016  . Depression 11/25/2015  . Essential (primary) hypertension 11/25/2015  . Hyperlipidemia 11/25/2015  . Temporary cerebral vascular dysfunction 11/25/2015  . Primary  malignant neoplasm ([place]) 11/25/2015  . Sleep apnea 11/25/2015  . Chronic anticoagulation 08/04/2015  . History of anticoagulant therapy 08/04/2015  . Pain in limb 12/26/2013  . Chest pain 11/27/2013  . GERD - post failed open Nissen 11/23/2012  . Dysphagia, unspecified(787.20) 09/28/2012  . Dysphagia 09/28/2012  . S/P Open Nissen 1998 Dr. Mendel Ryder 08/18/2012  . 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 Antiphospholipid antibody syndrome ([place]) 06/01/2011  . Lupus anticoagulant positive 06/01/2011  . Chest pain   . Hx-TIA (transient ischemic attack)   . Hypothyroidism   . Breast cancer ([place])   . History of DVT (deep vein thrombosis)   . History of antiphospholipid antibody syndrome    Past Medical History:  Diagnosis Date  . Anxiety    severe panic attacks  . Arthritis   . Aseptic meningitis   . Asthma    as child  . Breast cancer ([place])    right  . Chronic anticoagulation 08/04/2015  . Collapsed lung    hx of, left  . Complication of anesthesia    "hard to put asleep"  . Depression   . Fibromyalgia   . GERD (gastroesophageal reflux disease)   . H/O hiatal hernia   . Headache(784.0)   . Hepatitis 1990   "from eating at restaurant"  . History of antiphospholipid antibody syndrome   . 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  . Hx-TIA (transient ischemic attack)   . Hyperlipidemia   . Hypertension   . Hypothyroidism   . Neuromuscular disorder ([place])   . Pneumonia    hx of  . Soft tissue mass 06/14/2017   3 cm right post axilla same side as previous breast cancer 06/14/17  . Stroke ([place])   . 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 Alzheimer's disease Mother   . Heart disease Mother   . Hyperlipidemia Mother   . Hypertension Mother   . Cancer Maternal Grandmother        colon  . Heart disease  Sister   . Hypertension Sister   . Heart attack Sister   . Heart disease Brother   . Heart attack Brother   . Cancer Daughter   . Hypertension Daughter   . Hypertension Son     Past Surgical History:  Procedure Laterality Date  . ABDOMINAL HYSTERECTOMY    . BREAST LUMPECTOMY Right 2000  . BREAST SURGERY Right    x3  . BUNIONECTOMY Bilateral 30 years ago  . CARDIAC CATHETERIZATION  07/12/1997   NORMAL LEFT VENTRICULAR FUNCTION WITH EF AT LEAST 70-75%  . ESOPHAGEAL MANOMETRY N/A 11/06/2012   Procedure: ESOPHAGEAL MANOMETRY (EM);  Surgeon: Pedro Earls, MD;  Location: [facility];  Service: General;  Laterality: N/A;  . ESOPHAGOGASTRODUODENOSCOPY ENDOSCOPY    . KNEE ARTHROSCOPY Right   . LAPAROSCOPIC NISSEN  FUNDOPLICATION N/A 4/64/3142   Procedure: LAPAROSCOPIC TAKEDOWN  PERI-HIATAL HERNIA    REPAIR;  Surgeon: Pedro Earls, MD;  Location: [facility];  Service: General;  Laterality: N/A;  . [place]  . RIGHT OOPHORECTOMY  1980  . THYROIDECTOMY  in 20's  . TONSILLECTOMY  72 years old  . UPPER GI ENDOSCOPY N/A 12/12/2012   Procedure: UPPER GI ENDOSCOPY;  Surgeon: Pedro Earls, MD;  Location: [facility];  Service: General;  Laterality: N/A;   Social History   Occupational History  . Not on file  Tobacco Use  . Smoking status: Never Smoker  . Smokeless tobacco: Never Used  Substance and Sexual Activity  . Alcohol use: No    Alcohol/week: 0.0 oz  . Drug use: No  . Sexual activity: Not on file

## 2017-12-28 ENCOUNTER — Ambulatory Visit (HOSPITAL_COMMUNITY)
Admission: RE | Admit: 2017-12-28 | Discharge: 2017-12-28 | Disposition: A | Discharge status: Home / Self Care | Payer: Medicare HMO | Source: Ambulatory Visit | Attending: Cardiology | Admitting: Cardiology

## 2017-12-28 DIAGNOSIS — I6523 Occlusion and stenosis of bilateral carotid arteries: Secondary | ICD-10-CM | POA: Diagnosis present

## 2018-01-11 ENCOUNTER — Other Ambulatory Visit: Payer: Self-pay

## 2018-01-11 ENCOUNTER — Emergency Department (HOSPITAL_COMMUNITY)
Admission: EM | Admit: 2018-01-11 | Discharge: 2018-01-11 | Disposition: A | Discharge status: Home / Self Care | Payer: Medicare HMO | Attending: Emergency Medicine | Admitting: Emergency Medicine

## 2018-01-11 ENCOUNTER — Encounter (HOSPITAL_COMMUNITY): Payer: Self-pay | Admitting: Emergency Medicine

## 2018-01-11 DIAGNOSIS — R252 Cramp and spasm: Secondary | ICD-10-CM | POA: Diagnosis not present

## 2018-01-11 DIAGNOSIS — Z7982 Long term (current) use of aspirin: Secondary | ICD-10-CM | POA: Insufficient documentation

## 2018-01-11 DIAGNOSIS — Z853 Personal history of malignant neoplasm of breast: Secondary | ICD-10-CM | POA: Insufficient documentation

## 2018-01-11 DIAGNOSIS — Z8673 Personal history of transient ischemic attack (TIA), and cerebral infarction without residual deficits: Secondary | ICD-10-CM | POA: Insufficient documentation

## 2018-01-11 DIAGNOSIS — F329 Major depressive disorder, single episode, unspecified: Secondary | ICD-10-CM | POA: Diagnosis not present

## 2018-01-11 DIAGNOSIS — J45909 Unspecified asthma, uncomplicated: Secondary | ICD-10-CM | POA: Diagnosis not present

## 2018-01-11 DIAGNOSIS — Z79899 Other long term (current) drug therapy: Secondary | ICD-10-CM | POA: Diagnosis not present

## 2018-01-11 DIAGNOSIS — Z7901 Long term (current) use of anticoagulants: Secondary | ICD-10-CM | POA: Insufficient documentation

## 2018-01-11 DIAGNOSIS — E039 Hypothyroidism, unspecified: Secondary | ICD-10-CM | POA: Diagnosis not present

## 2018-01-11 DIAGNOSIS — F419 Anxiety disorder, unspecified: Secondary | ICD-10-CM | POA: Insufficient documentation

## 2018-01-11 LAB — COMPREHENSIVE METABOLIC PANEL
ALT: 19 U/L (ref 14–54)
AST: 18 U/L (ref 15–41)
Albumin: 4.2 g/dL (ref 3.5–5.0)
Alkaline Phosphatase: 108 U/L (ref 38–126)
Anion gap: 8 (ref 5–15)
BUN: 27 mg/dL — ABNORMAL HIGH (ref 6–20)
CO2: 28 mmol/L (ref 22–32)
Calcium: 8.8 mg/dL — ABNORMAL LOW (ref 8.9–10.3)
Chloride: 104 mmol/L (ref 101–111)
Creatinine, Ser: 1.16 mg/dL — ABNORMAL HIGH (ref 0.44–1.00)
GFR calc Af Amer: 54 mL/min — ABNORMAL LOW (ref >60)
GFR calc non Af Amer: 47 mL/min — ABNORMAL LOW (ref >60)
Glucose, Bld: 104 mg/dL — ABNORMAL HIGH (ref 65–99)
Potassium: 4.8 mmol/L (ref 3.5–5.1)
Sodium: 140 mmol/L (ref 135–145)
Total Bilirubin: 0.6 mg/dL (ref 0.3–1.2)
Total Protein: 7.6 g/dL (ref 6.5–8.1)

## 2018-01-11 LAB — CK TOTAL AND CKMB (NOT AT ARMC)
CK, MB: 3 ng/mL (ref 0.5–5.0)
Relative Index: 2.8 — ABNORMAL HIGH (ref 0.0–2.5)
Total CK: 107 U/L (ref 38–234)

## 2018-01-11 LAB — CBC
HCT: 41.6 % (ref 36.0–46.0)
Hemoglobin: 13.6 g/dL (ref 12.0–15.0)
MCH: 30.7 pg (ref 26.0–34.0)
MCHC: 32.7 g/dL (ref 30.0–36.0)
MCV: 93.9 fL (ref 78.0–100.0)
Platelets: 236 10*3/uL (ref 150–400)
RBC: 4.43 MIL/uL (ref 3.87–5.11)
RDW: 13.9 % (ref 11.5–15.5)
WBC: 9.6 10*3/uL (ref 4.0–10.5)

## 2018-01-11 LAB — URINALYSIS, ROUTINE W REFLEX MICROSCOPIC
Bacteria, UA: NONE SEEN
Bilirubin Urine: NEGATIVE
Glucose, UA: NEGATIVE mg/dL
Ketones, ur: NEGATIVE mg/dL
Leukocytes, UA: NEGATIVE
Nitrite: NEGATIVE
Protein, ur: NEGATIVE mg/dL
Specific Gravity, Urine: 1.014 (ref 1.005–1.030)
pH: 6 (ref 5.0–8.0)

## 2018-01-11 LAB — MAGNESIUM: Magnesium: 2.5 mg/dL — ABNORMAL HIGH (ref 1.7–2.4)

## 2018-01-11 LAB — LIPASE, BLOOD: Lipase: 34 U/L (ref 11–51)

## 2018-01-11 LAB — PROTIME-INR
INR: 2.49
Prothrombin Time: 26.7 seconds — ABNORMAL HIGH (ref 11.4–15.2)

## 2018-01-11 NOTE — ED Provider Notes (Signed)
[facility] DEPT Provider Note   CSN: [id number] Arrival date & time: 01/11/18  1318     History   Chief Complaint Chief Complaint  Patient presents with  . Abdominal Pain    HPI [name] is a 72 y.o. female.  72 year old female presents with complaint of cramping pain which starts at her right and left lower rib area, radiates down through her abdomen and down both legs to her ankles.  Patient states the pain comes in waves, occurs at least 6 times daily, wakes her from her sleep and causes her to jump out of bed.  Patient states when she jumps out of bed she feels like her legs are out of joint due to the intense muscle cramps.  Symptoms started on June 10, patient was seen in urgent care on June 12 and had x-rays which were normal, given an injection of Toradol which she states helped for about a day and has continued with her 3 times daily Flexeril without relief.  Patient has a history of fibromyalgia, takes her Xanax and Norco every day as prescribed.  Patient denies chest pain, nausea, vomiting, changes in bowel or bladder habits.  Nothing makes her symptoms worse, specifically pain is not worse with eating.     Past Medical History:  Diagnosis Date  . Anxiety    severe panic attacks  . Arthritis   . Aseptic meningitis   . Asthma    as child  . Breast cancer ([place])    right  . Chronic anticoagulation 08/04/2015  . Collapsed lung    hx of, left  . Complication of anesthesia    "hard to put asleep"  . Depression   . Fibromyalgia   . GERD (gastroesophageal reflux disease)   . H/O hiatal hernia   . Headache(784.0)   . Hepatitis 1990   "from eating at restaurant"  . History of antiphospholipid antibody syndrome   . 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  . Hx-TIA (transient ischemic attack)   . Hyperlipidemia   . Hypertension   . Hypothyroidism   . Neuromuscular disorder ([place])   . Pneumonia    hx of  . Soft  tissue mass 06/14/2017   3 cm right post axilla same side as previous breast cancer 06/14/17  . Stroke ([place])   . Thrombosis, upper extremity artery ([place]) 04/17/2012   Left digital artery  October 1998 - new dx antiphospholipid antibody syndrome    Patient Active Problem List   Diagnosis Date Noted  . Bilateral primary osteoarthritis of knee 12/27/2017  . Acute infection of nasal sinus 07/21/2017  . Prediabetes 06/29/2017  . Soft tissue mass 06/14/2017  . Displaced fracture of fifth metatarsal bone of right foot 02/07/2017  . Unilateral primary osteoarthritis, left knee 11/26/2016  . Unilateral primary osteoarthritis, right knee 11/26/2016  . Left leg pain   . Abnormal MRI of head   . Vision changes 04/13/2016  . Orthostatic hypotension 03/10/2016  . Vertigo 03/10/2016  . Depression 11/25/2015  . Essential (primary) hypertension 11/25/2015  . Hyperlipidemia 11/25/2015  . Temporary cerebral vascular dysfunction 11/25/2015  . Primary malignant neoplasm ([place]) 11/25/2015  . Sleep apnea 11/25/2015  . Chronic anticoagulation 08/04/2015  . History of anticoagulant therapy 08/04/2015  . Pain in limb 12/26/2013  . Chest pain 11/27/2013  . GERD - post failed open Nissen 11/23/2012  . Dysphagia, unspecified(787.20) 09/28/2012  . Dysphagia 09/28/2012  . S/P Open Nissen  1998 Dr. Mendel Ryder 08/18/2012  . 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 Antiphospholipid antibody syndrome ([place]) 06/01/2011  . Lupus anticoagulant positive 06/01/2011  . Chest pain   . Hx-TIA (transient ischemic attack)   . Hypothyroidism   . Breast cancer ([place])   . History of DVT (deep vein thrombosis)   . History of antiphospholipid antibody syndrome     Past Surgical History:  Procedure Laterality Date  . ABDOMINAL HYSTERECTOMY    . BREAST LUMPECTOMY Right 2000  . BREAST SURGERY Right    x3  . BUNIONECTOMY Bilateral 30 years ago  . CARDIAC CATHETERIZATION  07/12/1997   NORMAL LEFT VENTRICULAR  FUNCTION WITH EF AT LEAST 70-75%  . ESOPHAGEAL MANOMETRY N/A 11/06/2012   Procedure: ESOPHAGEAL MANOMETRY (EM);  Surgeon: Pedro Earls, MD;  Location: [facility];  Service: General;  Laterality: N/A;  . ESOPHAGOGASTRODUODENOSCOPY ENDOSCOPY    . KNEE ARTHROSCOPY Right   . LAPAROSCOPIC NISSEN FUNDOPLICATION N/A 09/06/2480   Procedure: LAPAROSCOPIC TAKEDOWN  PERI-HIATAL HERNIA    REPAIR;  Surgeon: Pedro Earls, MD;  Location: [facility] ORS;  Service: General;  Laterality: N/A;  . [place]  . RIGHT OOPHORECTOMY  1980  . THYROIDECTOMY  in 20's  . TONSILLECTOMY  72 years old  . UPPER GI ENDOSCOPY N/A 12/12/2012   Procedure: UPPER GI ENDOSCOPY;  Surgeon: Pedro Earls, MD;  Location: [facility] ORS;  Service: General;  Laterality: N/A;     OB History   None      Home Medications    Prior to Admission medications   Medication Sig Start Date End Date Taking? Authorizing Provider  ALPRAZolam [name]) 0.5 MG tablet Take 0.5 mg by mouth 2 (two) times daily as needed for sleep. Every evening and sometimes during the day if needed 06/07/12  Yes [provider]  Ascorbic Acid (VITAMIN C) 1000 MG tablet Take 1,000 mg by mouth daily.   Yes [provider]  CALCIUM CARBONATE PO Take 1 tablet by mouth daily.   Yes [provider]  Cobalamine Combinations (VITAMIN B12-FOLIC ACID PO) Take 1 tablet by mouth daily.   Yes [provider]  Cyanocobalamin (VITAMIN B 12 PO) Take 0.5-1 mg by mouth daily.   Yes [provider]  cyclobenzaprine (FLEXERIL) 5 MG tablet Take 5 mg by mouth 3 (three) times daily as needed for muscle spasms. Up to 10 days 01/04/18 01/14/18 Yes [provider]  fluticasone (FLONASE) 50 MCG/ACT nasal spray Place 2 sprays into the nose daily as needed for allergies.  10/16/14  Yes [provider]  gabapentin (NEURONTIN) 300 MG capsule TAKE 1 TO 2 CAPSULES THREE TIMES DAILY Patient taking differently: 300 mg 2 (two) times  daily. Pt is tapering off 10/21/17  Yes Marcial Pacas, MD  HYDROcodone-acetaminophen (NORCO/VICODIN) 5-325 MG per tablet Take 1 tablet by mouth every 8 (eight) hours as needed for moderate pain.    Yes [provider]  levothyroxine (SYNTHROID, LEVOTHROID) 112 MCG tablet Take 112 mcg by mouth daily before breakfast. Alternate with the 153mcg   Yes [provider]  levothyroxine (SYNTHROID, LEVOTHROID) 125 MCG tablet Take 125 mcg by mouth daily. Alternate with 1102mcg 06/24/16  Yes [provider]  magnesium oxide (MAG-OX) 400 MG tablet Take 400 mg by mouth daily.   Yes [provider]  metoprolol succinate (TOPROL-XL) 25 MG 24 hr tablet Take 12.5 mg by mouth 2 (two) times daily.    Yes [provider]  omeprazole (  PRILOSEC) 20 MG capsule Take 20 mg by mouth daily.  02/06/13  Yes [provider]  pravastatin (PRAVACHOL) 20 MG tablet Take 20 mg by mouth daily.   Yes Christain Sacramento, MD  warfarin (COUMADIN) 5 MG tablet Take 1 tablet (5 mg total) by mouth daily for 1 dose. 06/20/17 01/11/18 Yes Annia Belt, MD  aspirin 81 MG tablet Take 81 mg by mouth daily at 6 PM.     [provider]    Family History Family History  Problem Relation Age of Onset  . Alzheimer's disease Mother   . Heart disease Mother   . Hyperlipidemia Mother   . Hypertension Mother   . Cancer Maternal Grandmother        colon  . Heart disease Sister   . Hypertension Sister   . Heart attack Sister   . Heart disease Brother   . Heart attack Brother   . Cancer Daughter   . Hypertension Daughter   . Hypertension Son     Social History Social History   Tobacco Use  . Smoking status: Never Smoker  . Smokeless tobacco: Never Used  Substance Use Topics  . Alcohol use: No    Alcohol/week: 0.0 oz  . Drug use: No     Allergies   Bee venom; Hornet venom; Reglan [metoclopramide]; Vitamin k; Benadryl [diphenhydramine hcl]; Ciprofloxacin; Crestor  [rosuvastatin calcium]; Cymbalta [duloxetine hcl]; Daypro [oxaprozin]; Doxycycline; Levofloxacin; Sulfonamide derivatives; and Zocor [simvastatin]   Review of Systems Review of Systems  Constitutional: Negative for chills and fever.  HENT: Negative for ear pain and sore throat.   Eyes: Negative for pain and visual disturbance.  Respiratory: Negative for cough and shortness of breath.   Cardiovascular: Negative for chest pain and palpitations.  Gastrointestinal: Negative for abdominal pain, constipation, diarrhea, nausea and vomiting.  Genitourinary: Negative for dysuria and hematuria.  Musculoskeletal: Positive for arthralgias and myalgias. Negative for back pain, gait problem, neck pain and neck stiffness.  Skin: Negative for color change and rash.  Neurological: Negative for seizures and syncope.  Hematological: Does not bruise/bleed easily.  Psychiatric/Behavioral: Negative for confusion.  All other systems reviewed and are negative.    Physical Exam Updated Vital Signs BP (!) 143/87   Pulse 68   Temp 97.9 F (36.6 C) (Oral)   Resp 18   SpO2 98%   Physical Exam  Constitutional: She is oriented to person, place, and time. She appears well-developed and well-nourished.  HENT:  Head: Normocephalic and atraumatic.  Cardiovascular: Normal rate and regular rhythm.  Pulmonary/Chest: Effort normal and breath sounds normal.  Abdominal: Normal appearance and bowel sounds are normal. There is no tenderness.  Well healed midline upper abdominal incision   Musculoskeletal:  TTP diffuse lower legs without edema, erythema. Strong DP pulses bilaterally   Neurological: She is alert and oriented to person, place, and time.  Skin: Skin is warm and dry. No rash noted.  Psychiatric: She has a normal mood and affect. Her behavior is normal.  Nursing note and vitals reviewed.    ED Treatments / Results  Labs (all labs ordered are listed, but only abnormal results are displayed) Labs  Reviewed  COMPREHENSIVE METABOLIC PANEL - Abnormal; Notable for the following components:      Result Value   Glucose, Bld 104 (*)    BUN 27 (*)    Creatinine, Ser 1.16 (*)    Calcium 8.8 (*)    GFR calc non Af Amer 47 (*)  GFR calc Af Amer 54 (*)    All other components within normal limits  URINALYSIS, ROUTINE W REFLEX MICROSCOPIC - Abnormal; Notable for the following components:   Hgb urine dipstick MODERATE (*)    All other components within normal limits  PROTIME-INR - Abnormal; Notable for the following components:   Prothrombin Time 26.7 (*)    All other components within normal limits  MAGNESIUM - Abnormal; Notable for the following components:   Magnesium 2.5 (*)    All other components within normal limits  CK TOTAL AND CKMB (NOT AT [facility]) - Abnormal; Notable for the following components:   Relative Index 2.8 (*)    All other components within normal limits  LIPASE, BLOOD  CBC    EKG None  Radiology No results found.  Procedures Procedures (including critical care time)  Medications Ordered in ED Medications - No data to display   Initial Impression / Assessment and Plan / ED Course  I have reviewed the triage vital signs and the nursing notes.  Pertinent labs & imaging results that were available during my care of the patient were reviewed by me and considered in my medical decision making (see chart for details).  Clinical Course as of Jan 11 1949  Wed Jan 11, 6885  2118 72 year old female presents with complaint of muscle cramps from her left and right lower rib area extending all the way down to her legs.  Symptoms onset June 10th, seen by urgent care on June 12 for this and was given Toradol and Flexeril.  Patient states one-time dose of Toradol in the office gave her 1 day relief from her symptoms otherwise she is been taking the Flexeril without any improvement in her symptoms.  Patient denies chest pain, abdominal pain, changes in bowel or bladder  habits, nausea, vomiting, fevers, chills.  Patient states that she has been tapering off of her gabapentin and she does not feel this is helping with her leg pain.  Patient continues to take her Xanax and Norco. On exam, patient has tenderness with palpation of her lower legs, states this is chronic, her abdomen is soft and non tender. Lab work is unremarkable- CBC is normal, CMP with elevated BUN/Cr- at patient's baseline. INR is 2.49 and therapeutic for her coumadin use. UA with moderate hemoglobin, no UTI. Mg is mildly elevated at 2.5. CK pending (send out). Patient does not want to wait any longer, I do not suspect rhabdomyolysis. Advised patient I will contact her if her result is abnormal, otherwise she should take her gabapentin as prescribed and discussed this with her PCP at follow-up.   [LM]    Clinical Course User Index [LM] Tacy Learn, PA-C     Final Clinical Impressions(s) / ED Diagnoses   Final diagnoses:  Muscle cramps    ED Discharge Orders    None       Roque Lias 01/11/18 [place], MD 01/12/18 [phone]

## 2018-01-11 NOTE — ED Triage Notes (Signed)
Pt verbalizes right side abdominal pain without n/v/d onset Thursday.

## 2018-01-13 ENCOUNTER — Encounter (HOSPITAL_COMMUNITY): Payer: Self-pay | Admitting: Emergency Medicine

## 2018-01-13 ENCOUNTER — Emergency Department (HOSPITAL_COMMUNITY): Payer: Medicare HMO

## 2018-01-13 ENCOUNTER — Emergency Department (HOSPITAL_COMMUNITY)
Admission: EM | Admit: 2018-01-13 | Discharge: 2018-01-13 | Disposition: A | Discharge status: Home / Self Care | Payer: Medicare HMO | Attending: Emergency Medicine | Admitting: Emergency Medicine

## 2018-01-13 DIAGNOSIS — Z86718 Personal history of other venous thrombosis and embolism: Secondary | ICD-10-CM | POA: Insufficient documentation

## 2018-01-13 DIAGNOSIS — R109 Unspecified abdominal pain: Secondary | ICD-10-CM | POA: Diagnosis not present

## 2018-01-13 DIAGNOSIS — Z853 Personal history of malignant neoplasm of breast: Secondary | ICD-10-CM | POA: Diagnosis not present

## 2018-01-13 DIAGNOSIS — I10 Essential (primary) hypertension: Secondary | ICD-10-CM | POA: Diagnosis not present

## 2018-01-13 DIAGNOSIS — E039 Hypothyroidism, unspecified: Secondary | ICD-10-CM | POA: Insufficient documentation

## 2018-01-13 DIAGNOSIS — Z79899 Other long term (current) drug therapy: Secondary | ICD-10-CM | POA: Insufficient documentation

## 2018-01-13 DIAGNOSIS — M545 Low back pain, unspecified: Secondary | ICD-10-CM

## 2018-01-13 MED ORDER — KETOROLAC TROMETHAMINE 30 MG/ML IJ SOLN
15.0000 mg | Freq: Once | INTRAMUSCULAR | Status: AC
  Administered 2018-01-13: 15 mg via INTRAMUSCULAR
  Filled 2018-01-13: qty 1

## 2018-01-13 MED ORDER — CYCLOBENZAPRINE HCL 5 MG PO TABS: 5.0000 mg | ORAL_TABLET | Freq: Three times a day (TID) | ORAL | 0 refills | Status: AC | PRN

## 2018-01-13 NOTE — ED Triage Notes (Signed)
Pt c/o right lower back pains since last night. Denies urinary problems. Reports was seen here the other day but for pain in different area. Reports had to take pain pill this am before could move out of bed. Denies falls or injuires.

## 2018-01-13 NOTE — Discharge Instructions (Signed)
Continue taking your Vicodin and Flexeril at home.  Use ice and heat alternating 20 minutes on, 20 minutes off.  Attempt the exercises and stretches as tolerated 1-2 times daily.  Please follow-up with your doctor next week for recheck.  Please return to emergency department if you develop any new or worsening symptoms.

## 2018-01-13 NOTE — ED Provider Notes (Addendum)
[facility] DEPT Provider Note   CSN: [id number] Arrival date & time: 01/13/18  1256     History   Chief Complaint Chief Complaint  Patient presents with  . Back Pain    HPI PHINLEY SCHALL is a 72 y.o. female with history of antiphospholipid antibody syndrome, fibromyalgia, panic attacks, hypertension, hypothyroidism who presents with a 10-day history of intermittent cramping.  Patient reports that she presents today because of severe right lower back pain sharp.  It radiated to her right hip.  She denies any numbness or tingling to her legs.  She denies any loss of bowel or bladder control, saddle anesthesia.  She took Vicodin and Flexeril which improved to some degree.  It is worse with movement.  She was seen yesterday and had unremarkable labs.  Her thoracic x-ray at urgent care was negative.  She was given Toradol IM at that time which did improve her symptoms for a day or two.  HPI  Past Medical History:  Diagnosis Date  . Anxiety    severe panic attacks  . Arthritis   . Aseptic meningitis   . Asthma    as child  . Breast cancer ([place])    right  . Chronic anticoagulation 08/04/2015  . Collapsed lung    hx of, left  . Complication of anesthesia    "hard to put asleep"  . Depression   . Fibromyalgia   . GERD (gastroesophageal reflux disease)   . H/O hiatal hernia   . Headache(784.0)   . Hepatitis 1990   "from eating at restaurant"  . History of antiphospholipid antibody syndrome   . 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  . Hx-TIA (transient ischemic attack)   . Hyperlipidemia   . Hypertension   . Hypothyroidism   . Neuromuscular disorder ([place])   . Pneumonia    hx of  . Soft tissue mass 06/14/2017   3 cm right post axilla same side as previous breast cancer 06/14/17  . Stroke ([place])   . 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 Bilateral primary osteoarthritis of knee 12/27/2017  . Acute infection of nasal sinus 07/21/2017  . Prediabetes 06/29/2017  . Soft tissue mass 06/14/2017  . Displaced fracture of fifth metatarsal bone of right foot 02/07/2017  . Unilateral primary osteoarthritis, left knee 11/26/2016  . Unilateral primary osteoarthritis, right knee 11/26/2016  . Left leg pain   . Abnormal MRI of head   . Vision changes 04/13/2016  . Orthostatic hypotension 03/10/2016  . Vertigo 03/10/2016  . Depression 11/25/2015  . Essential (primary) hypertension 11/25/2015  . Hyperlipidemia 11/25/2015  . Temporary cerebral vascular dysfunction 11/25/2015  . Primary malignant neoplasm ([place]) 11/25/2015  . Sleep apnea 11/25/2015  . Chronic anticoagulation 08/04/2015  . History of anticoagulant therapy 08/04/2015  . Pain in limb 12/26/2013  . Chest pain 11/27/2013  . GERD - post failed open Nissen 11/23/2012  . Dysphagia, unspecified(787.20) 09/28/2012  . Dysphagia 09/28/2012  . S/P Open Nissen 1998 Dr. Mendel Ryder 08/18/2012  . 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 Antiphospholipid antibody syndrome ([place]) 06/01/2011  . Lupus anticoagulant positive 06/01/2011  . Chest pain   . Hx-TIA (transient ischemic attack)   . Hypothyroidism   . Breast cancer ([place])   . History of DVT (  deep vein thrombosis)   . History of antiphospholipid antibody syndrome     Past Surgical History:  Procedure Laterality Date  . ABDOMINAL HYSTERECTOMY    . BREAST LUMPECTOMY Right 2000  . BREAST SURGERY Right    x3  . BUNIONECTOMY Bilateral 30 years ago  . CARDIAC CATHETERIZATION  07/12/1997   NORMAL LEFT VENTRICULAR FUNCTION WITH EF AT LEAST 70-75%  . ESOPHAGEAL MANOMETRY N/A 11/06/2012   Procedure: ESOPHAGEAL MANOMETRY (EM);  Surgeon: Pedro Earls, MD;  Location: [facility];  Service: General;  Laterality: N/A;  . ESOPHAGOGASTRODUODENOSCOPY  ENDOSCOPY    . KNEE ARTHROSCOPY Right   . LAPAROSCOPIC NISSEN FUNDOPLICATION N/A 02/17/3663   Procedure: LAPAROSCOPIC TAKEDOWN  PERI-HIATAL HERNIA    REPAIR;  Surgeon: Pedro Earls, MD;  Location: [facility]S;  Service: General;  Laterality: N/A;  . [place]  . RIGHT OOPHORECTOMY  1980  . THYROIDECTOMY  in 20's  . TONSILLECTOMY  72 years old  . UPPER GI ENDOSCOPY N/A 12/12/2012   Procedure: UPPER GI ENDOSCOPY;  Surgeon: Pedro Earls, MD;  Location: [facility];  Service: General;  Laterality: N/A;     OB History   None      Home Medications    Prior to Admission medications   Medication Sig Start Date End Date Taking? Authorizing Provider  ALPRAZolam [name]) 0.5 MG tablet Take 0.5 mg by mouth 2 (two) times daily as needed for sleep. Every evening and sometimes during the day if needed 06/07/12  Yes [provider]  Ascorbic Acid (VITAMIN C) 1000 MG tablet Take 1,000 mg by mouth daily.   Yes [provider]  aspirin 81 MG tablet Take 81 mg by mouth daily at 6 PM.    Yes [provider]  CALCIUM CARBONATE PO Take 1 tablet by mouth daily.   Yes [provider]  Cyanocobalamin (VITAMIN B 12 PO) Take 0.5-1 mg by mouth daily.   Yes [provider]  gabapentin (NEURONTIN) 300 MG capsule TAKE 1 TO 2 CAPSULES THREE TIMES DAILY Patient taking differently: Take 300 mg by mouth 2 (two) times daily. Pt is tapering off 10/21/17  Yes Marcial Pacas, MD  HYDROcodone-acetaminophen (NORCO/VICODIN) 5-325 MG per tablet Take 1-2 tablets by mouth every 8 (eight) hours as needed for moderate pain or severe pain.    Yes [provider]  levothyroxine (SYNTHROID, LEVOTHROID) 112 MCG tablet Take 112 mcg by mouth daily before breakfast. Alternate with the 189mcg   Yes [provider]  levothyroxine (SYNTHROID, LEVOTHROID) 125 MCG tablet Take 125 mcg by mouth daily. Alternate with 1110mcg 06/24/16  Yes [provider]  magnesium oxide  (MAG-OX) 400 MG tablet Take 400 mg by mouth daily.   Yes [provider]  metoprolol succinate (TOPROL-XL) 25 MG 24 hr tablet Take 12.5 mg by mouth 2 (two) times daily.    Yes [provider]  omeprazole (PRILOSEC) 20 MG capsule Take 20 mg by mouth daily.  02/06/13  Yes [provider]  pravastatin (PRAVACHOL) 20 MG tablet Take 20 mg by mouth daily.   Yes Christain Sacramento, MD  warfarin (COUMADIN) 5 MG tablet Take 5-7.5 mg by mouth daily. Take 1 tablet (5 mg) daily except on Tuesday Take 1.5 tablets (7.5 mg).   Yes [provider]  cyclobenzaprine (FLEXERIL) 5 MG tablet Take 1 tablet (5 mg total) by mouth 3 (three) times daily as needed for up to 10 days for muscle spasms. Up to 10  days 01/13/18 01/23/18  Frederica Kuster, PA-C  fluticasone (FLONASE) 50 MCG/ACT nasal spray Place 2 sprays into the nose daily as needed for allergies.  10/16/14   [provider]  warfarin (COUMADIN) 5 MG tablet Take 1 tablet (5 mg total) by mouth daily for 1 dose. Patient not taking: Reported on 01/13/2018 06/20/17 01/11/18  Annia Belt, MD    Family History Family History  Problem Relation Age of Onset  . Alzheimer's disease Mother   . Heart disease Mother   . Hyperlipidemia Mother   . Hypertension Mother   . Cancer Maternal Grandmother        colon  . Heart disease Sister   . Hypertension Sister   . Heart attack Sister   . Heart disease Brother   . Heart attack Brother   . Cancer Daughter   . Hypertension Daughter   . Hypertension Son     Social History Social History   Tobacco Use  . Smoking status: Never Smoker  . Smokeless tobacco: Never Used  Substance Use Topics  . Alcohol use: No    Alcohol/week: 0.0 oz  . Drug use: No     Allergies   Bee venom; Hornet venom; Reglan [metoclopramide]; Vitamin k; Benadryl [diphenhydramine hcl]; Ciprofloxacin; Crestor [rosuvastatin calcium]; Cymbalta [duloxetine hcl]; Daypro [oxaprozin]; Doxycycline;  Levofloxacin; Sulfonamide derivatives; and Zocor [simvastatin]   Review of Systems Review of Systems  Constitutional: Negative for chills and fever.  HENT: Negative for facial swelling and sore throat.   Respiratory: Negative for shortness of breath.   Cardiovascular: Negative for chest pain.  Gastrointestinal: Negative for abdominal pain, nausea and vomiting.  Genitourinary: Negative for dysuria.  Musculoskeletal: Positive for back pain and myalgias.  Skin: Negative for rash and wound.  Neurological: Negative for weakness, numbness and headaches.  Psychiatric/Behavioral: The patient is not nervous/anxious.      Physical Exam Updated Vital Signs BP 112/75   Pulse (!) 58   Temp 97.7 F (36.5 C) (Oral)   Resp 14   Ht 5' 5.5" (1.664 m)   Wt 80.3 kg (177 lb)   SpO2 100%   BMI 29.01 kg/m   Physical Exam  Constitutional: She appears well-developed and well-nourished. No distress.  HENT:  Head: Normocephalic and atraumatic.  Mouth/Throat: Oropharynx is clear and moist. No oropharyngeal exudate.  Eyes: Pupils are equal, round, and reactive to light. Conjunctivae are normal. Right eye exhibits no discharge. Left eye exhibits no discharge. No scleral icterus.  Neck: Normal range of motion. Neck supple. No thyromegaly present.  Cardiovascular: Normal rate, regular rhythm, normal heart sounds and intact distal pulses. Exam reveals no gallop and no friction rub.  No murmur heard. Pulmonary/Chest: Effort normal and breath sounds normal. No stridor. No respiratory distress. She has no wheezes. She has no rales.  Abdominal: Soft. Bowel sounds are normal. She exhibits no distension. There is no tenderness. There is no rebound and no guarding.  Musculoskeletal: She exhibits no edema.       Back:  Lymphadenopathy:    She has no cervical adenopathy.  Neurological: She is alert. Coordination normal.  Normal sensation throughout; 5/5 strength in all 4 extremities; equal bilateral grip  strength  Skin: Skin is warm and dry. No rash noted. She is not diaphoretic. No pallor.  Psychiatric: She has a normal mood and affect.  Nursing note and vitals reviewed.    ED Treatments / Results  Labs (all labs ordered are listed, but only abnormal results are displayed) Labs  Reviewed - No data to display  EKG None  Radiology Dg Lumbar Spine Complete  Result Date: 01/13/2018 CLINICAL DATA:  Midline and RIGHT side sharp severe low back pain since last night, denies injury EXAM: LUMBAR SPINE - COMPLETE 4+ VIEW COMPARISON:  CT abdomen and pelvis 09/20/2014 FINDINGS: Osseous demineralization. Hypoplastic last rib pair. 5 non-rib-bearing lumbar vertebra. Disc space narrowing L4-L5. Mild scattered endplate spurs lower lumbar spine. Vertebral body heights maintained without fracture, subluxation, bone destruction or spondylolysis. SI joints preserved. Mild facet degenerative changes L5-S1. Questionable tiny nonobstructing LEFT renal calculus. IMPRESSION: Osseous demineralization with degenerative disc and facet disease changes of lower lumbar spine as above. No acute abnormalities. Electronically Signed   By: Lavonia Dana M.D.   On: 01/13/2018 15:58   Ct Renal Stone Study  Result Date: 01/13/2018 CLINICAL DATA:  72 year old female with right lower back pain, flank pain since last night. EXAM: CT ABDOMEN AND PELVIS WITHOUT CONTRAST TECHNIQUE: Multidetector CT imaging of the abdomen and pelvis was performed following the standard protocol without IV contrast. COMPARISON:  Lumbar radiographs 1545 hours today. CT Abdomen and Pelvis 09/20/2014 FINDINGS: Lower chest: Stable cardiac size at the upper limits of normal. No pericardial or pleural effusion. Mild chronic lung base scarring including in the lateral segment of the right middle lobe. Hepatobiliary: Negative noncontrast liver and gallbladder. Pancreas: Stable pancreatic atrophy. Spleen: Stable and negative with surrounding surgical clips.  Adrenals/Urinary Tract: Normal adrenal glands. Stable and negative noncontrast CT appearance of both kidneys and both ureters. Mildly distended but otherwise unremarkable urinary bladder. No urologic calculus. Stomach/Bowel: Negative rectum. Redundant but decompressed sigmoid colon with mild diverticulosis. Negative left colon. Retained stool at the splenic flexure, and to a lesser extent throughout the transverse colon. Negative right colon. Diminutive or absent appendix. Negative terminal ileum. No dilated small bowel. Surgical clips about the stomach and spleen. The stomach is decompressed. Negative duodenum. No abdominal free air, free fluid. Vascular/Lymphatic: Vascular patency is not evaluated in the absence of IV contrast. Aortoiliac calcified atherosclerosis. No lymphadenopathy. Reproductive: Surgically absent. Other: No pelvic free fluid. Musculoskeletal: Osteopenia. Vertebral height and alignment remains normal. Lower lumbar disc bulging appears not significantly changed. Chronic L5-S1 facet hypertrophy has mildly progressed with vacuum phenomena now. Sacrum and SI joints appear intact. No acute osseous abnormality identified. IMPRESSION: 1. Negative for age noncontrast CT appearance of abdominal and pelvic viscera. No urologic calculus or obstructive uropathy. 2. No acute osseous abnormality identified. Chronic L5-S1 facet arthropathy has progressed since 2016. 3. Mild Aortic Atherosclerosis (ICD10-I70.0). Electronically Signed   By: Genevie Ann M.D.   On: 01/13/2018 17:42    Procedures Procedures (including critical care time)  Medications Ordered in ED Medications  ketorolac (TORADOL) 30 MG/ML injection 15 mg (15 mg Intramuscular Given 01/13/18 1535)     Initial Impression / Assessment and Plan / ED Course  I have reviewed the triage vital signs and the nursing notes.  Pertinent labs & imaging results that were available during my care of the patient were reviewed by me and considered in my  medical decision making (see chart for details).     Patient presenting with right lower back pain radiating to her right hip.  She denies any recent injury.  She returns after being evaluated yesterday and having labs completed.  She had no imaging at this time.  She reported intermittent muscle cramping the past 10 days.  This time, it was sharp pain from her right low back.  Improved with Vicodin prior  to arrival and improved more so with IM Toradol here.  Lumbar x-ray shows osseous demineralization with degenerative disc and facet disease, no acute abnormalities.  No acute normality seen on CT renal stone study.  Patient advised to continue taking her Vicodin at home, will refill Flexeril, advised ice, heat, and exercise and stretching.  Follow-up to PCP next week for recheck.  Return precautions discussed.  Patient understands and agrees with plan.  Patient vitals stable throughout ED course and discharged in satisfactory condition.  Patient was also evaluated by Dr. Zenia Resides who guided the patient's management and agrees with plan.  Final Clinical Impressions(s) / ED Diagnoses   Final diagnoses:  Acute right-sided low back pain without sciatica    ED Discharge Orders        Ordered    cyclobenzaprine (FLEXERIL) 5 MG tablet  3 times daily PRN     01/13/18 1853         Frederica Kuster, PA-C 01/13/18 1946    Lacretia Leigh, MD 01/13/18 2259

## 2018-01-13 NOTE — ED Provider Notes (Signed)
Medical screening examination/treatment/procedure(s) were conducted as a shared visit with non-physician practitioner(s) and myself.  I personally evaluated the patient during the encounter.  None 72 year old female here complaining of right-sided back pain.  Her lumbar spine series was negative.  Her renal CT was negative.  She was medicated here and feels better.  Stable for discharge   Lacretia Leigh, MD 01/13/18 1859

## 2018-01-16 ENCOUNTER — Telehealth: Payer: Self-pay | Admitting: Pharmacist

## 2018-01-16 ENCOUNTER — Ambulatory Visit (INDEPENDENT_AMBULATORY_CARE_PROVIDER_SITE_OTHER): Payer: Medicare HMO | Admitting: Pharmacist

## 2018-01-16 DIAGNOSIS — D6861 Antiphospholipid syndrome: Secondary | ICD-10-CM | POA: Diagnosis not present

## 2018-01-16 DIAGNOSIS — Z7901 Long term (current) use of anticoagulants: Secondary | ICD-10-CM

## 2018-01-16 DIAGNOSIS — Z5181 Encounter for therapeutic drug level monitoring: Secondary | ICD-10-CM

## 2018-01-16 DIAGNOSIS — R760 Raised antibody titer: Secondary | ICD-10-CM

## 2018-01-16 LAB — POCT INR: INR: 4.2 — AB (ref 2.0–3.0)

## 2018-01-16 NOTE — Telephone Encounter (Signed)
Patient was called and instructed to OMIT todays dose. Commencing tommorow, Tuesday June 25, recommence taking one (1) of your 5mg  peach-colored warfarin tablets once-daily at [facility] each day.

## 2018-01-16 NOTE — Progress Notes (Signed)
Anticoagulation Management [name] is a 72 y.o. female who reports to the clinic for monitoring of warfarin treatment.    Indication: Antiphospholipid antibody syndrome (HCC) [D68.61], Lupus anticoagulant positive [R76.0], Long term-current use of anticoagulant.    Duration: indefinite Supervising physician: Murriel Hopper  Anticoagulation Clinic Visit History: Patient does not report signs/symptoms of bleeding or thromboembolism  Other recent changes: No diet, medications, lifestyle changes endorsed.  Anticoagulation Episode Summary    Current INR goal:   2.0-3.0  TTR:   68.8 % (6.6 y)  Next INR check:   01/30/2018  INR from last check:   4.2! (01/16/2018)  Weekly max warfarin dose:     Target end date:     INR check location:     Preferred lab:     Send INR reminders to:      Indications   Antiphospholipid antibody syndrome (HCC) [D68.61] Lupus anticoagulant positive [R76.0]       Comments:         Anticoagulation Care Providers    Provider Role Specialty Phone number   Annia Belt, MD Referring Oncology [phone]      Allergies  Allergen Reactions  . Bee Venom Anaphylaxis  . Hornet Venom Anaphylaxis    Anaphylaxis Shock SHOCK  . Reglan [Metoclopramide] Shortness Of Breath and Other (See Comments)    Tongue got numb; didn't feel good  . Vitamin K Anaphylaxis and Rash    "kills me" iv  . Benadryl [Diphenhydramine Hcl] Rash  . Ciprofloxacin Rash  . Crestor [Rosuvastatin Calcium] Other (See Comments)    Muscle Aches  . Cymbalta [Duloxetine Hcl] Swelling    Swelling of tongue and thrush  . Daypro [Oxaprozin] Rash    Rash  . Doxycycline Other (See Comments)    Tongue burning, thrush  . Levofloxacin Rash  . Sulfonamide Derivatives Rash  . Zocor [Simvastatin] Other (See Comments)    Muscle Aches   Prior to Admission medications   Medication Sig Start Date End Date Taking? Authorizing Provider  ALPRAZolam [name]) 0.5 MG tablet Take 0.5 mg  by mouth 2 (two) times daily as needed for sleep. Every evening and sometimes during the day if needed 06/07/12  Yes [provider]  Ascorbic Acid (VITAMIN C) 1000 MG tablet Take 1,000 mg by mouth daily.   Yes [provider]  aspirin 81 MG tablet Take 81 mg by mouth daily at 6 PM.    Yes [provider]  CALCIUM CARBONATE PO Take 1 tablet by mouth daily.   Yes [provider]  Cyanocobalamin (VITAMIN B 12 PO) Take 0.5-1 mg by mouth daily.   Yes [provider]  cyclobenzaprine (FLEXERIL) 5 MG tablet Take 1 tablet (5 mg total) by mouth 3 (three) times daily as needed for up to 10 days for muscle spasms. Up to 10 days 01/13/18 01/23/18 Yes Law, Bea Graff, PA-C  fluticasone (FLONASE) 50 MCG/ACT nasal spray Place 2 sprays into the nose daily as needed for allergies.  10/16/14  Yes [provider]  gabapentin (NEURONTIN) 300 MG capsule TAKE 1 TO 2 CAPSULES THREE TIMES DAILY Patient taking differently: Take 300 mg by mouth 2 (two) times daily. Pt is tapering off 10/21/17  Yes Marcial Pacas, MD  HYDROcodone-acetaminophen (NORCO/VICODIN) 5-325 MG per tablet Take 1-2 tablets by mouth every 8 (eight) hours as needed for moderate pain or severe pain.    Yes [provider]  levothyroxine (SYNTHROID, LEVOTHROID) 112 MCG tablet Take 112 mcg by mouth  daily before breakfast. Alternate with the 117mcg   Yes [provider]  levothyroxine (SYNTHROID, LEVOTHROID) 125 MCG tablet Take 125 mcg by mouth daily. Alternate with 162mcg 06/24/16  Yes [provider]  magnesium oxide (MAG-OX) 400 MG tablet Take 400 mg by mouth daily.   Yes [provider]  metoprolol succinate (TOPROL-XL) 25 MG 24 hr tablet Take 12.5 mg by mouth 2 (two) times daily.    Yes [provider]  omeprazole (PRILOSEC) 20 MG capsule Take 20 mg by mouth daily.  02/06/13  Yes [provider]  pravastatin (PRAVACHOL) 20 MG tablet Take 20 mg by mouth  daily.   Yes Christain Sacramento, MD  warfarin (COUMADIN) 5 MG tablet Take 5-7.5 mg by mouth daily. Take 1 tablet (5 mg) daily except on Tuesday Take 1.5 tablets (7.5 mg).   Yes [provider]  warfarin (COUMADIN) 5 MG tablet Take 1 tablet (5 mg total) by mouth daily for 1 dose. Patient not taking: Reported on 01/13/2018 06/20/17 01/11/18  Annia Belt, MD   Past Medical History:  Diagnosis Date  . Anxiety    severe panic attacks  . Arthritis   . Aseptic meningitis   . Asthma    as child  . Breast cancer ([place])    right  . Chronic anticoagulation 08/04/2015  . Collapsed lung    hx of, left  . Complication of anesthesia    "hard to put asleep"  . Depression   . Fibromyalgia   . GERD (gastroesophageal reflux disease)   . H/O hiatal hernia   . Headache(784.0)   . Hepatitis 1990   "from eating at restaurant"  . History of antiphospholipid antibody syndrome   . 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  . Hx-TIA (transient ischemic attack)   . Hyperlipidemia   . Hypertension   . Hypothyroidism   . Neuromuscular disorder ([place])   . Pneumonia    hx of  . Soft tissue mass 06/14/2017   3 cm right post axilla same side as previous breast cancer 06/14/17  . Stroke ([place])   . 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 Marital status: Single    Spouse name: Not on file  . Number of children: 2  . Years of education: 49  . Highest education level: Not on file  Occupational History  . Not on file  Social Needs  . Financial resource strain: Not on file  . Food insecurity:    Worry: Not on file    Inability: Not on file  . Transportation needs:    Medical: Not on file    Non-medical: Not on file  Tobacco Use  . Smoking status: Never Smoker  . Smokeless tobacco: Never Used  Substance and Sexual Activity  . Alcohol use:  No    Alcohol/week: 0.0 oz  . Drug use: No  . Sexual activity: Not on file  Lifestyle  . Physical activity:    Days per week: Not on file    Minutes per session: Not on file  . Stress: Not on file  Relationships  . Social connections:    Talks on phone: Not on file    Gets together: Not on file    Attends religious service: Not on file    Active member of club or organization: Not  on file    Attends meetings of clubs or organizations: Not on file    Relationship status: Not on file  Other Topics Concern  . Not on file  Social History Narrative   Lives at home with her mother.  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   Right-handed.   2-4 cups caffeine daily.   Family History  Problem Relation Age of Onset  . Alzheimer's disease Mother   . Heart disease Mother   . Hyperlipidemia Mother   . Hypertension Mother   . Cancer Maternal Grandmother        colon  . Heart disease Sister   . Hypertension Sister   . Heart attack Sister   . Heart disease Brother   . Heart attack Brother   . Cancer Daughter   . Hypertension Daughter   . Hypertension Son     ASSESSMENT Recent Results: The most recent result is correlated with 37.5 mg per week: Lab Results  Component Value Date   INR 4.2 (A) 01/16/2018   INR 2.49 01/11/2018   INR 4.9 (A) 12/26/2017   PROTIME 30.0 (H) 03/13/2015    Anticoagulation Dosing: Description   DR YIFOYDXAJOI'N PATIENT (route warfarin notes and list as authorizing provider for LOS & Follow-up, lab orders and warfarin prescriptions), do not list attending physician.  Take one (1) of your 5mg  peach-colored warfarin tablets by mouth, once-daily at 6PM.      INR today: Supratherapeutic  PLAN Weekly dose was decreased by 7% to 35 mg per week  Patient Instructions  Patient instructed to take medications as defined in the Anti-coagulation Track section of this encounter.  Patient instructed to take today's dose.  Patient instructed to  take one (1) of your 5mg  peach-colored warfarin tablets by mouth, once-daily at [facility]. Patient verbalized understanding of these instructions.     Patient advised to contact clinic or seek medical attention if signs/symptoms of bleeding or thromboembolism occur.  Patient verbalized understanding by repeating back information and was advised to contact me if further medication-related questions arise. Patient was also provided an information handout.  Follow-up Return in about 2 weeks (around 01/30/2018) for Follow up INR at 1130h.  Pennie Banter, PharmD, CACP, CPP  15 minutes spent face-to-face with the patient during the encounter. 50% of time spent on education. 50% of time was spent on fingerstick point of care INR sample collection, processing, results determination, dose adjustment and documentation in [email or web address].

## 2018-01-16 NOTE — Progress Notes (Signed)
Reviewed thx DrG 

## 2018-01-16 NOTE — Patient Instructions (Signed)
Patient instructed to take medications as defined in the Anti-coagulation Track section of this encounter.  Patient instructed to take today's dose.  Patient instructed to take one (1) of your 5mg peach-colored warfarin tablets by mouth, once-daily at 6PM. Patient verbalized understanding of these instructions.    

## 2018-01-17 ENCOUNTER — Ambulatory Visit: Payer: Medicare HMO | Admitting: Cardiology

## 2018-01-23 ENCOUNTER — Ambulatory Visit: Payer: Medicare HMO

## 2018-01-30 ENCOUNTER — Ambulatory Visit (INDEPENDENT_AMBULATORY_CARE_PROVIDER_SITE_OTHER): Payer: Medicare HMO | Admitting: Pharmacist

## 2018-01-30 DIAGNOSIS — D6861 Antiphospholipid syndrome: Secondary | ICD-10-CM

## 2018-01-30 DIAGNOSIS — Z7901 Long term (current) use of anticoagulants: Secondary | ICD-10-CM

## 2018-01-30 DIAGNOSIS — R760 Raised antibody titer: Secondary | ICD-10-CM

## 2018-01-30 DIAGNOSIS — Z5181 Encounter for therapeutic drug level monitoring: Secondary | ICD-10-CM | POA: Diagnosis not present

## 2018-01-30 LAB — POCT INR: INR: 2.2 (ref 2.0–3.0)

## 2018-01-30 NOTE — Patient Instructions (Signed)
Patient instructed to take medications as defined in the Anti-coagulation Track section of this encounter.  Patient instructed to take today's dose.  Patient instructed to take one (1) of your 5mg peach-colored warfarin tablets by mouth, once-daily at 6PM. Patient verbalized understanding of these instructions.    

## 2018-01-30 NOTE — Progress Notes (Signed)
Reviewed Thanks DrG 

## 2018-01-30 NOTE — Progress Notes (Signed)
Anticoagulation Management [name] is a 72 y.o. female who reports to the clinic for monitoring of warfarin treatment.    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  Duration: indefinite Supervising physician: Murriel Hopper  Anticoagulation Clinic Visit History: Patient does not report signs/symptoms of bleeding or thromboembolism  Other recent changes: No diet, medications, lifestyle changes endorsed.  Anticoagulation Episode Summary    Current INR goal:   2.0-3.0  TTR:   68.6 % (6.6 y)  Next INR check:   02/27/2018  INR from last check:   2.2 (01/30/2018)  Weekly max warfarin dose:     Target end date:     INR check location:     Preferred lab:     Send INR reminders to:      Indications   Antiphospholipid antibody syndrome ([facility]) [D68.61] Lupus anticoagulant positive [R76.0]       Comments:         Anticoagulation Care Providers    Provider Role Specialty Phone number   Annia Belt, MD Referring Oncology [phone]      Allergies  Allergen Reactions  . Bee Venom Anaphylaxis  . Hornet Venom Anaphylaxis    Anaphylaxis Shock SHOCK  . Reglan [Metoclopramide] Shortness Of Breath and Other (See Comments)    Tongue got numb; didn't feel good  . Vitamin K Anaphylaxis and Rash    "kills me" iv  . Benadryl [Diphenhydramine Hcl] Rash  . Ciprofloxacin Rash  . Crestor [Rosuvastatin Calcium] Other (See Comments)    Muscle Aches  . Cymbalta [Duloxetine Hcl] Swelling    Swelling of tongue and thrush  . Daypro [Oxaprozin] Rash    Rash  . Doxycycline Other (See Comments)    Tongue burning, thrush  . Levofloxacin Rash  . Sulfonamide Derivatives Rash  . Zocor [Simvastatin] Other (See Comments)    Muscle Aches   Prior to Admission medications   Medication Sig Start Date End Date Taking? Authorizing Provider  ALPRAZolam [name]) 0.5 MG tablet Take 0.5 mg by mouth 2 (two)  times daily as needed for sleep. Every evening and sometimes during the day if needed 06/07/12  Yes [provider]  Ascorbic Acid (VITAMIN C) 1000 MG tablet Take 1,000 mg by mouth daily.   Yes [provider]  aspirin 81 MG tablet Take 81 mg by mouth daily at 6 PM.    Yes [provider]  CALCIUM CARBONATE PO Take 1 tablet by mouth daily.   Yes [provider]  Cyanocobalamin (VITAMIN B 12 PO) Take 0.5-1 mg by mouth daily.   Yes [provider]  fluticasone (FLONASE) 50 MCG/ACT nasal spray Place 2 sprays into the nose daily as needed for allergies.  10/16/14  Yes [provider]  gabapentin (NEURONTIN) 300 MG capsule TAKE 1 TO 2 CAPSULES THREE TIMES DAILY Patient taking differently: Take 300 mg by mouth 2 (two) times daily. Pt is tapering off 10/21/17  Yes Marcial Pacas, MD  HYDROcodone-acetaminophen (NORCO/VICODIN) 5-325 MG per tablet Take 1-2 tablets by mouth every 8 (eight) hours as needed for moderate pain or severe pain.    Yes [provider]  levothyroxine (SYNTHROID, LEVOTHROID) 112 MCG tablet Take 112 mcg by mouth daily before breakfast. Alternate with the 138mcg   Yes [provider]  levothyroxine (SYNTHROID, LEVOTHROID) 125 MCG tablet Take 125 mcg by mouth daily. Alternate with 19mcg 06/24/16  Yes [provider]  magnesium oxide (MAG-OX) 400 MG tablet Take 400  mg by mouth daily.   Yes [provider]  metoprolol succinate (TOPROL-XL) 25 MG 24 hr tablet Take 12.5 mg by mouth 2 (two) times daily.    Yes [provider]  omeprazole (PRILOSEC) 20 MG capsule Take 20 mg by mouth daily.  02/06/13  Yes [provider]  pravastatin (PRAVACHOL) 20 MG tablet Take 20 mg by mouth daily.   Yes Christain Sacramento, MD  warfarin (COUMADIN) 5 MG tablet Take 5-7.5 mg by mouth daily. Take 1 tablet (5 mg) daily except on Tuesday Take 1.5 tablets (7.5 mg).   Yes [provider]  warfarin (COUMADIN) 5  MG tablet Take 1 tablet (5 mg total) by mouth daily for 1 dose. Patient not taking: Reported on 01/13/2018 06/20/17 01/11/18  Annia Belt, MD   Past Medical History:  Diagnosis Date  . Anxiety    severe panic attacks  . Arthritis   . Aseptic meningitis   . Asthma    as child  . Breast cancer ([place])    right  . Chronic anticoagulation 08/04/2015  . Collapsed lung    hx of, left  . Complication of anesthesia    "hard to put asleep"  . Depression   . Fibromyalgia   . GERD (gastroesophageal reflux disease)   . H/O hiatal hernia   . Headache(784.0)   . Hepatitis 1990   "from eating at restaurant"  . History of antiphospholipid antibody syndrome   . 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  . Hx-TIA (transient ischemic attack)   . Hyperlipidemia   . Hypertension   . Hypothyroidism   . Neuromuscular disorder ([place])   . Pneumonia    hx of  . Soft tissue mass 06/14/2017   3 cm right post axilla same side as previous breast cancer 06/14/17  . Stroke ([place])   . 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 Marital status: Single    Spouse name: Not on file  . Number of children: 2  . Years of education: 68  . Highest education level: Not on file  Occupational History  . Not on file  Social Needs  . Financial resource strain: Not on file  . Food insecurity:    Worry: Not on file    Inability: Not on file  . Transportation needs:    Medical: Not on file    Non-medical: Not on file  Tobacco Use  . Smoking status: Never Smoker  . Smokeless tobacco: Never Used  Substance and Sexual Activity  . Alcohol use: No    Alcohol/week: 0.0 oz  . Drug use: No  . Sexual activity: Not on file  Lifestyle  . Physical activity:    Days per week: Not on file    Minutes per session: Not on file  . Stress: Not on file  Relationships  .  Social connections:    Talks on phone: Not on file    Gets together: Not on file    Attends religious service: Not on file    Active member of club or organization: Not on file    Attends meetings of clubs or organizations: Not on file    Relationship status: Not on file  Other Topics Concern  . Not on file  Social History Narrative   Lives at home with her mother.  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   Right-handed.   2-4 cups caffeine daily.   Family History  Problem Relation Age of Onset  . Alzheimer's disease Mother   . Heart disease Mother   . Hyperlipidemia Mother   . Hypertension Mother   . Cancer Maternal Grandmother        colon  . Heart disease Sister   . Hypertension Sister   . Heart attack Sister   . Heart disease Brother   . Heart attack Brother   . Cancer Daughter   . Hypertension Daughter   . Hypertension Son     ASSESSMENT Recent Results: The most recent result is correlated with 35 mg per week: Lab Results  Component Value Date   INR 2.2 01/30/2018   INR 4.2 (A) 01/16/2018   INR 2.49 01/11/2018   PROTIME 30.0 (H) 03/13/2015    Anticoagulation Dosing: Description   DR [id number] PATIENT (route warfarin notes and list as authorizing provider for LOS & Follow-up, lab orders and warfarin prescriptions), do not list attending physician.  Take one (1) of your 5mg  peach-colored warfarin tablets by mouth, once-daily at 6PM.      INR today: Therapeutic  PLAN Weekly dose was unchanged.   Patient Instructions  Patient instructed to take medications as defined in the Anti-coagulation Track section of this encounter.  Patient instructed to take today's dose.  Patient instructed to take one (1) of your 5mg  peach-colored warfarin tablets by mouth, once-daily at [facility].  Patient verbalized understanding of these instructions.     Patient advised to contact clinic or seek medical attention if signs/symptoms of bleeding or  thromboembolism occur.  Patient verbalized understanding by repeating back information and was advised to contact me if further medication-related questions arise. Patient was also provided an information handout.  Follow-up Return in 1 month (on 02/27/2018) for Follow up INR at 1100h.  Pennie Banter, PharmD, CACP, CPP  15 minutes spent face-to-face with the patient during the encounter. 50% of time spent on education. 50% of time was spent on fingerstick point of care INR sample collection, processing, results determination and documentation in [email or web address].

## 2018-02-13 ENCOUNTER — Emergency Department (HOSPITAL_COMMUNITY): Payer: Medicare HMO

## 2018-02-13 ENCOUNTER — Other Ambulatory Visit: Payer: Self-pay

## 2018-02-13 ENCOUNTER — Emergency Department (HOSPITAL_COMMUNITY)
Admission: EM | Admit: 2018-02-13 | Discharge: 2018-02-14 | Disposition: A | Discharge status: Home / Self Care | Payer: Medicare HMO | Attending: Emergency Medicine | Admitting: Emergency Medicine

## 2018-02-13 ENCOUNTER — Emergency Department (INDEPENDENT_AMBULATORY_CARE_PROVIDER_SITE_OTHER)
Admission: EM | Admit: 2018-02-13 | Discharge: 2018-02-13 | Disposition: A | Discharge status: Home / Self Care | Payer: Medicare HMO | Source: Home / Self Care | Attending: Family Medicine | Admitting: Family Medicine

## 2018-02-13 ENCOUNTER — Encounter (HOSPITAL_COMMUNITY): Payer: Self-pay | Admitting: Emergency Medicine

## 2018-02-13 DIAGNOSIS — Z79899 Other long term (current) drug therapy: Secondary | ICD-10-CM | POA: Diagnosis not present

## 2018-02-13 DIAGNOSIS — Z7982 Long term (current) use of aspirin: Secondary | ICD-10-CM | POA: Insufficient documentation

## 2018-02-13 DIAGNOSIS — M79605 Pain in left leg: Secondary | ICD-10-CM | POA: Diagnosis not present

## 2018-02-13 DIAGNOSIS — S7002XA Contusion of left hip, initial encounter: Secondary | ICD-10-CM | POA: Diagnosis not present

## 2018-02-13 DIAGNOSIS — Y999 Unspecified external cause status: Secondary | ICD-10-CM | POA: Diagnosis not present

## 2018-02-13 DIAGNOSIS — Z8673 Personal history of transient ischemic attack (TIA), and cerebral infarction without residual deficits: Secondary | ICD-10-CM | POA: Diagnosis not present

## 2018-02-13 DIAGNOSIS — Y92481 Parking lot as the place of occurrence of the external cause: Secondary | ICD-10-CM | POA: Diagnosis not present

## 2018-02-13 DIAGNOSIS — M25552 Pain in left hip: Secondary | ICD-10-CM

## 2018-02-13 DIAGNOSIS — Z853 Personal history of malignant neoplasm of breast: Secondary | ICD-10-CM | POA: Diagnosis not present

## 2018-02-13 DIAGNOSIS — I10 Essential (primary) hypertension: Secondary | ICD-10-CM | POA: Insufficient documentation

## 2018-02-13 DIAGNOSIS — W010XXA Fall on same level from slipping, tripping and stumbling without subsequent striking against object, initial encounter: Secondary | ICD-10-CM | POA: Diagnosis not present

## 2018-02-13 DIAGNOSIS — S0990XA Unspecified injury of head, initial encounter: Secondary | ICD-10-CM | POA: Diagnosis not present

## 2018-02-13 DIAGNOSIS — Y9301 Activity, walking, marching and hiking: Secondary | ICD-10-CM | POA: Diagnosis not present

## 2018-02-13 DIAGNOSIS — S92532A Displaced fracture of distal phalanx of left lesser toe(s), initial encounter for closed fracture: Secondary | ICD-10-CM | POA: Diagnosis not present

## 2018-02-13 DIAGNOSIS — E039 Hypothyroidism, unspecified: Secondary | ICD-10-CM | POA: Diagnosis not present

## 2018-02-13 DIAGNOSIS — R0789 Other chest pain: Secondary | ICD-10-CM | POA: Diagnosis not present

## 2018-02-13 DIAGNOSIS — M25572 Pain in left ankle and joints of left foot: Secondary | ICD-10-CM | POA: Insufficient documentation

## 2018-02-13 DIAGNOSIS — Z86718 Personal history of other venous thrombosis and embolism: Secondary | ICD-10-CM | POA: Insufficient documentation

## 2018-02-13 DIAGNOSIS — Z7901 Long term (current) use of anticoagulants: Secondary | ICD-10-CM | POA: Diagnosis not present

## 2018-02-13 DIAGNOSIS — S92912A Unspecified fracture of left toe(s), initial encounter for closed fracture: Secondary | ICD-10-CM

## 2018-02-13 LAB — BASIC METABOLIC PANEL
Anion gap: 9 (ref 5–15)
BUN: 13 mg/dL (ref 8–23)
CO2: 24 mmol/L (ref 22–32)
Calcium: 8 mg/dL — ABNORMAL LOW (ref 8.9–10.3)
Chloride: 104 mmol/L (ref 98–111)
Creatinine, Ser: 0.98 mg/dL (ref 0.44–1.00)
GFR calc Af Amer: >60 mL/min (ref >60)
GFR calc non Af Amer: 57 mL/min — ABNORMAL LOW (ref >60)
Glucose, Bld: 149 mg/dL — ABNORMAL HIGH (ref 70–99)
Potassium: 4.1 mmol/L (ref 3.5–5.1)
Sodium: 137 mmol/L (ref 135–145)

## 2018-02-13 LAB — CBC
HCT: 35.5 % — ABNORMAL LOW (ref 36.0–46.0)
Hemoglobin: 11.3 g/dL — ABNORMAL LOW (ref 12.0–15.0)
MCH: 30.7 pg (ref 26.0–34.0)
MCHC: 31.8 g/dL (ref 30.0–36.0)
MCV: 96.5 fL (ref 78.0–100.0)
Platelets: 234 10*3/uL (ref 150–400)
RBC: 3.68 MIL/uL — ABNORMAL LOW (ref 3.87–5.11)
RDW: 13.9 % (ref 11.5–15.5)
WBC: 11.6 10*3/uL — ABNORMAL HIGH (ref 4.0–10.5)

## 2018-02-13 LAB — PROTIME-INR
INR: 2.26
Prothrombin Time: 24.8 seconds — ABNORMAL HIGH (ref 11.4–15.2)

## 2018-02-13 MED ORDER — FENTANYL CITRATE (PF) 100 MCG/2ML IJ SOLN
INTRAMUSCULAR | Status: AC
  Filled 2018-02-13: qty 2

## 2018-02-13 MED ORDER — FENTANYL CITRATE (PF) 100 MCG/2ML IJ SOLN
50.0000 ug | Freq: Once | INTRAMUSCULAR | Status: AC
  Administered 2018-02-13: 50 ug via INTRAVENOUS

## 2018-02-13 MED ORDER — SODIUM CHLORIDE 0.9 % IV BOLUS
1000.0000 mL | Freq: Once | INTRAVENOUS | Status: AC
  Administered 2018-02-13: 1000 mL via INTRAVENOUS

## 2018-02-13 NOTE — ED Notes (Signed)
Pt to go to CT after XRAYS completed.

## 2018-02-13 NOTE — ED Triage Notes (Addendum)
Pt transferred from [facility] for further eval of fall. EMS reports shortening of L leg. Edema noted to L tibia. Pt reports L hip, leg and rib pain. Pt fell when getting out of a car, reports her foot got stuck. Pt reports recent stomatitis? - states she was on a medication numbs her mouth.  EMS gave 300 mcg fentanyl IV PTA.

## 2018-02-13 NOTE — ED Notes (Signed)
Patient transported to X-ray 

## 2018-02-13 NOTE — ED Provider Notes (Signed)
[name]    CSN: [id number] Arrival date & time: 02/13/18  1907     History   Chief Complaint Chief Complaint  Patient presents with  . Fall  . Hip Pain    HPI [name] is a 72 y.o. female.   HPI  [name] is a 72 y.o. female presenting to UC with c/o sudden onset severe Left side rib pain and Left hip pain radiating down her Left leg that started about 1 hour PTA. Pt tripped in a parking lot and fell onto the flat asphalt onto her Left side. She is on Coumadin for a clotting disorder. Denies hitting her head or LOC.  She did take 1 Vicodin PTA w/o relief. Pt [facility] but thought it was a hospital.    Past Medical History:  Diagnosis Date  . Anxiety    severe panic attacks  . Arthritis   . Aseptic meningitis   . Asthma    as child  . Breast cancer ([place])    right  . Chronic anticoagulation 08/04/2015  . Collapsed lung    hx of, left  . Complication of anesthesia    "hard to put asleep"  . Depression   . Fibromyalgia   . GERD (gastroesophageal reflux disease)   . H/O hiatal hernia   . Headache(784.0)   . Hepatitis 1990   "from eating at restaurant"  . History of antiphospholipid antibody syndrome   . 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  . Hx-TIA (transient ischemic attack)   . Hyperlipidemia   . Hypertension   . Hypothyroidism   . Neuromuscular disorder ([place])   . Pneumonia    hx of  . Soft tissue mass 06/14/2017   3 cm right post axilla same side as previous breast cancer 06/14/17  . Stroke ([place])   . 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 Bilateral primary osteoarthritis of knee 12/27/2017  . Acute infection of nasal sinus 07/21/2017  . Prediabetes 06/29/2017  . Soft tissue mass 06/14/2017  . Displaced fracture of fifth metatarsal bone of right foot  02/07/2017  . Unilateral primary osteoarthritis, left knee 11/26/2016  . Unilateral primary osteoarthritis, right knee 11/26/2016  . Left leg pain   . Abnormal MRI of head   . Vision changes 04/13/2016  . Orthostatic hypotension 03/10/2016  . Vertigo 03/10/2016  . Depression 11/25/2015  . Essential (primary) hypertension 11/25/2015  . Hyperlipidemia 11/25/2015  . Temporary cerebral vascular dysfunction 11/25/2015  . Primary malignant neoplasm ([place]) 11/25/2015  . Sleep apnea 11/25/2015  . Chronic anticoagulation 08/04/2015  . History of anticoagulant therapy 08/04/2015  . Pain in limb 12/26/2013  . Chest pain 11/27/2013  . GERD - post failed open Nissen 11/23/2012  . Dysphagia, unspecified(787.20) 09/28/2012  . Dysphagia 09/28/2012  . S/P Open Nissen 1998 Dr. Mendel Ryder 08/18/2012  . 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 Antiphospholipid antibody syndrome ([place]) 06/01/2011  . Lupus anticoagulant positive 06/01/2011  . Chest pain   . Hx-TIA (transient ischemic attack)   . Hypothyroidism   . Breast cancer ([place])   . History of DVT (deep vein thrombosis)   . History of antiphospholipid antibody syndrome     Past Surgical History:  Procedure Laterality Date  . ABDOMINAL HYSTERECTOMY    .  BREAST LUMPECTOMY Right 2000  . BREAST SURGERY Right    x3  . BUNIONECTOMY Bilateral 30 years ago  . CARDIAC CATHETERIZATION  07/12/1997   NORMAL LEFT VENTRICULAR FUNCTION WITH EF AT LEAST 70-75%  . ESOPHAGEAL MANOMETRY N/A 11/06/2012   Procedure: ESOPHAGEAL MANOMETRY (EM);  Surgeon: Pedro Earls, MD;  Location: [facility] ENDOSCOPY;  Service: General;  Laterality: N/A;  . ESOPHAGOGASTRODUODENOSCOPY ENDOSCOPY    . KNEE ARTHROSCOPY Right   . LAPAROSCOPIC NISSEN FUNDOPLICATION N/A 5/42/7062   Procedure: LAPAROSCOPIC TAKEDOWN  PERI-HIATAL HERNIA    REPAIR;  Surgeon: Pedro Earls, MD;  Location: [facility] ORS;  Service: General;  Laterality: N/A;  . [place]  . RIGHT  OOPHORECTOMY  1980  . THYROIDECTOMY  in 20's  . TONSILLECTOMY  72 years old  . UPPER GI ENDOSCOPY N/A 12/12/2012   Procedure: UPPER GI ENDOSCOPY;  Surgeon: Pedro Earls, MD;  Location: [facility] ORS;  Service: General;  Laterality: N/A;    OB History   None      Home Medications    Prior to Admission medications   Medication Sig Start Date End Date Taking? Authorizing Provider  ALPRAZolam [name]) 0.5 MG tablet Take 1 mg by mouth See admin instructions. Every evening and sometimes during the day if needed for anxiety 06/07/12   [provider]  Ascorbic Acid (VITAMIN C) 1000 MG tablet Take 1,000 mg by mouth daily.    [provider]  aspirin 81 MG tablet Take 81 mg by mouth daily at 6 PM.     [provider]  CALCIUM CARBONATE PO Take 1 tablet by mouth daily.    [provider]  Cyanocobalamin (VITAMIN B 12 PO) Take 0.5-1 mg by mouth daily.    [provider]  fluticasone (FLONASE) 50 MCG/ACT nasal spray Place 2 sprays into the nose daily as needed for allergies.  10/16/14   [provider]  gabapentin (NEURONTIN) 300 MG capsule TAKE 1 TO 2 CAPSULES THREE TIMES DAILY Patient taking differently: Take 300 mg by mouth 2 (two) times daily. Pt is tapering off 10/21/17   Marcial Pacas, MD  HYDROcodone-acetaminophen (NORCO/VICODIN) 5-325 MG per tablet Take 1 tablet by mouth 2 (two) times daily.     [provider]  levothyroxine (SYNTHROID, LEVOTHROID) 112 MCG tablet Take 112 mcg by mouth daily before breakfast. Alternate with the 1100mcg    [provider]  levothyroxine (SYNTHROID, LEVOTHROID) 125 MCG tablet Take 125 mcg by mouth daily. Alternate with 176mcg 06/24/16   [provider]  lidocaine (XYLOCAINE) 2 % solution Use as directed 15 mLs in the mouth or throat every 4 (four) hours as needed for mouth pain.    [provider]  magnesium oxide (MAG-OX) 400 MG tablet Take 400 mg by mouth daily.    [provider]  metoprolol succinate (TOPROL-XL) 25 MG 24 hr tablet Take 12.5 mg by mouth 2 (two) times daily.     [provider]  omeprazole (PRILOSEC) 20 MG capsule Take 20 mg by mouth daily.  02/06/13   [provider]  pravastatin (PRAVACHOL) 20 MG tablet Take 20 mg by mouth daily.    Christain Sacramento, MD  warfarin (COUMADIN) 5 MG tablet Take 1 tablet (5 mg total) by mouth daily for 1 dose. Patient taking differently: Take 5-7.5 mg by mouth daily. Take 5 mg everyday except Tuesday take 7.5 mg. 06/20/17 02/13/18  Annia Belt, MD    Family History Family  History  Problem Relation Age of Onset  . Alzheimer's disease Mother   . Heart disease Mother   . Hyperlipidemia Mother   . Hypertension Mother   . Cancer Maternal Grandmother        colon  . Heart disease Sister   . Hypertension Sister   . Heart attack Sister   . Heart disease Brother   . Heart attack Brother   . Cancer Daughter   . Hypertension Daughter   . Hypertension Son     Social History Social History   Tobacco Use  . Smoking status: Never Smoker  . Smokeless tobacco: Never Used  Substance Use Topics  . Alcohol use: No    Alcohol/week: 0.0 oz  . Drug use: No     Allergies   Bee venom; Hornet venom; Reglan [metoclopramide]; Vitamin k; Benadryl [diphenhydramine hcl]; Ciprofloxacin; Crestor [rosuvastatin calcium]; Cymbalta [duloxetine hcl]; Daypro [oxaprozin]; Doxycycline; Levofloxacin; Sulfonamide derivatives; and Zocor [simvastatin]   Review of Systems Review of Systems  Respiratory: Negative for shortness of breath and wheezing.   Cardiovascular: Positive for chest pain. Negative for palpitations.  Musculoskeletal: Positive for arthralgias, back pain, gait problem ( unable to bear weight on Left side) and myalgias.     Physical Exam Triage Vital Signs ED Triage Vitals [02/13/18 1935]  Enc Vitals Group     BP (!) 144/81     Pulse Rate 98     Resp      Temp 97.9 F (36.6 C)       Temp Source Oral     SpO2 99 %     Weight      Height      Head Circumference      Peak Flow      Pain Score 10     Pain Loc      Pain Edu?      Excl. in [employer]?    No data found.  Updated Vital Signs BP (!) 144/81 (BP Location: Left Arm)   Pulse 98   Temp 97.9 F (36.6 C) (Oral)   SpO2 99%   Visual Acuity Right Eye Distance:   Left Eye Distance:   Bilateral Distance:    Right Eye Near:   Left Eye Near:    Bilateral Near:     Physical Exam  Constitutional: She is oriented to person, place, and time. She appears well-developed and well-nourished. She appears distressed.  Pt sitting in wheelchair leaning on her Right hip, unable to get comfortable  HENT:  Head: Normocephalic and atraumatic.  Eyes: Pupils are equal, round, and reactive to light. EOM are normal.  Neck: Normal range of motion. Neck supple.  No midline bone tenderness, no crepitus or step-offs.    Cardiovascular: Normal rate and regular rhythm.  Pulmonary/Chest: Effort normal and breath sounds normal. No stridor. No respiratory distress. She has no wheezes. She has no rales. She exhibits tenderness.  Left side chest wall tenderness. No ecchymosis. Skin in tact   Musculoskeletal: She exhibits tenderness.  Severe tenderness to Left hip, questionable mild deformity. Unable to stand pt or lye pt flat during exam due to severe pain  Neurological: She is alert and oriented to person, place, and time. No cranial nerve deficit.  Skin: Skin is warm and dry.  Psychiatric: She has a normal mood and affect. Her behavior is normal.  Nursing note and vitals reviewed.    UC Treatments / Results  Labs (all labs ordered are listed, but only abnormal results are  displayed) Labs Reviewed - No data to display  EKG None  Procedures Procedures (including critical care time)  Medications Ordered in UC Medications - No data to display  Initial Impression / Assessment and Plan / UC Course  I have reviewed the triage  vital signs and the nursing notes.  Pertinent labs & imaging results that were available during my care of the patient were reviewed by me and considered in my medical decision making (see chart for details).    Concern for Left hip fracture. Pt agreeable for EMS transport to [facility]. Pt discharged to care of EMS in stable condition for transport.   Final Clinical Impressions(s) / UC Diagnoses   Final diagnoses:  Fall from slip, trip, or stumble, initial encounter  Chest wall pain  Left hip pain   Discharge Instructions   None    ED Prescriptions    None     Controlled Substance Prescriptions [place] Controlled Substance Registry consulted? Not Applicable   Tyrell Antonio 02/14/18 [id number]

## 2018-02-13 NOTE — ED Notes (Signed)
ED Provider at bedside. 

## 2018-02-13 NOTE — ED Triage Notes (Signed)
Pt fell getting out of the car about 2 hours ago, pain in the left rib, hip and ankle.

## 2018-02-13 NOTE — ED Notes (Signed)
Paged ortho  Tech for cam walker

## 2018-02-14 NOTE — Progress Notes (Signed)
Orthopedic Tech Progress Note Patient Details:  [name] [birth year] [id number]  Ortho Devices Type of Ortho Device: CAM walker Ortho Device/Splint Location: lle Ortho Device/Splint Interventions: Ordered, Application, Adjustment   Post Interventions Patient Tolerated: Well Instructions Provided: Care of device, Adjustment of device   Karolee Stamps 02/14/2018, 12:15 AM

## 2018-02-14 NOTE — Discharge Instructions (Signed)
We signed the ER after you fell down. X-rays show that you have to fracture and contusion of your hip. Ice the area of pain.  Keep the leg elevated. Be careful with walking, and keep the cam walker boot on at all times that you are walking.  See the orthopedist as requested.

## 2018-02-15 NOTE — ED Provider Notes (Signed)
[facility] EMERGENCY DEPARTMENT Provider Note   CSN: [id number] Arrival date & time: 02/13/18  2036     History   Chief Complaint Chief Complaint  Patient presents with  . Fall    HPI [name] is a 72 y.o. female.  HPI 72 year old female comes in with chief complaint of fall.  Patient was in a parking lot of a healthcare facility when she fell because of poor balance.  Patient is complaining of pain to her left leg, ankle, foot, hip.  Patient is also having left-sided chest wall pain.  Patient is quite sure that she struck her head.  She denies any new numbness, tingling, loss of consciousness, vision change, dizziness.  Past Medical History:  Diagnosis Date  . Anxiety    severe panic attacks  . Arthritis   . Aseptic meningitis   . Asthma    as child  . Breast cancer ([place])    right  . Chronic anticoagulation 08/04/2015  . Collapsed lung    hx of, left  . Complication of anesthesia    "hard to put asleep"  . Depression   . Fibromyalgia   . GERD (gastroesophageal reflux disease)   . H/O hiatal hernia   . Headache(784.0)   . Hepatitis 1990   "from eating at restaurant"  . History of antiphospholipid antibody syndrome   . 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  . Hx-TIA (transient ischemic attack)   . Hyperlipidemia   . Hypertension   . Hypothyroidism   . Neuromuscular disorder ([place])   . Pneumonia    hx of  . Soft tissue mass 06/14/2017   3 cm right post axilla same side as previous breast cancer 06/14/17  . Stroke ([place])   . Thrombosis, upper extremity artery ([place]) 04/17/2012   Left digital artery  October 1998 - new dx antiphospholipid antibody syndrome    Patient Active Problem List   Diagnosis Date Noted  . Bilateral primary osteoarthritis of knee 12/27/2017  . Acute infection of nasal sinus 07/21/2017  . Prediabetes 06/29/2017  . Soft tissue mass 06/14/2017  . Displaced fracture of fifth metatarsal bone of  right foot 02/07/2017  . Unilateral primary osteoarthritis, left knee 11/26/2016  . Unilateral primary osteoarthritis, right knee 11/26/2016  . Left leg pain   . Abnormal MRI of head   . Vision changes 04/13/2016  . Orthostatic hypotension 03/10/2016  . Vertigo 03/10/2016  . Depression 11/25/2015  . Essential (primary) hypertension 11/25/2015  . Hyperlipidemia 11/25/2015  . Temporary cerebral vascular dysfunction 11/25/2015  . Primary malignant neoplasm ([place]) 11/25/2015  . Sleep apnea 11/25/2015  . Chronic anticoagulation 08/04/2015  . History of anticoagulant therapy 08/04/2015  . Pain in limb 12/26/2013  . Chest pain 11/27/2013  . GERD - post failed open Nissen 11/23/2012  . Dysphagia, unspecified(787.20) 09/28/2012  . Dysphagia 09/28/2012  . S/P Open Nissen 1998 Dr. Mendel Ryder 08/18/2012  . 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 Antiphospholipid antibody syndrome ([place]) 06/01/2011  . Lupus anticoagulant positive 06/01/2011  . Chest pain   . Hx-TIA (transient ischemic attack)   . Hypothyroidism   . Breast cancer ([place])   . History of DVT (deep vein thrombosis)   . History of antiphospholipid antibody syndrome     Past Surgical History:  Procedure Laterality Date  . ABDOMINAL HYSTERECTOMY    . BREAST LUMPECTOMY Right 2000  . BREAST SURGERY Right    x3  . BUNIONECTOMY  Bilateral 30 years ago  . CARDIAC CATHETERIZATION  07/12/1997   NORMAL LEFT VENTRICULAR FUNCTION WITH EF AT LEAST 70-75%  . ESOPHAGEAL MANOMETRY N/A 11/06/2012   Procedure: ESOPHAGEAL MANOMETRY (EM);  Surgeon: Pedro Earls, MD;  Location: [facility];  Service: General;  Laterality: N/A;  . ESOPHAGOGASTRODUODENOSCOPY ENDOSCOPY    . KNEE ARTHROSCOPY Right   . LAPAROSCOPIC NISSEN FUNDOPLICATION N/A 5/42/7062   Procedure: LAPAROSCOPIC TAKEDOWN  PERI-HIATAL HERNIA    REPAIR;  Surgeon: Pedro Earls, MD;  Location: [facility];  Service: General;  Laterality: N/A;  . [place]  .  RIGHT OOPHORECTOMY  1980  . THYROIDECTOMY  in 20's  . TONSILLECTOMY  72 years old  . UPPER GI ENDOSCOPY N/A 12/12/2012   Procedure: UPPER GI ENDOSCOPY;  Surgeon: Pedro Earls, MD;  Location: [facility];  Service: General;  Laterality: N/A;     OB History   None      Home Medications    Prior to Admission medications   Medication Sig Start Date End Date Taking? Authorizing Provider  ALPRAZolam [name]) 0.5 MG tablet Take 1 mg by mouth See admin instructions. Every evening and sometimes during the day if needed for anxiety 06/07/12  Yes [provider]  Ascorbic Acid (VITAMIN C) 1000 MG tablet Take 1,000 mg by mouth daily.   Yes [provider]  aspirin 81 MG tablet Take 81 mg by mouth daily at 6 PM.    Yes [provider]  CALCIUM CARBONATE PO Take 1 tablet by mouth daily.   Yes [provider]  Cyanocobalamin (VITAMIN B 12 PO) Take 0.5-1 mg by mouth daily.   Yes [provider]  fluticasone (FLONASE) 50 MCG/ACT nasal spray Place 2 sprays into the nose daily as needed for allergies.  10/16/14  Yes [provider]  gabapentin (NEURONTIN) 300 MG capsule TAKE 1 TO 2 CAPSULES THREE TIMES DAILY Patient taking differently: Take 300 mg by mouth 2 (two) times daily. Pt is tapering off 10/21/17  Yes Marcial Pacas, MD  HYDROcodone-acetaminophen (NORCO/VICODIN) 5-325 MG per tablet Take 1 tablet by mouth 2 (two) times daily.    Yes [provider]  levothyroxine (SYNTHROID, LEVOTHROID) 112 MCG tablet Take 112 mcg by mouth daily before breakfast. Alternate with the 140mcg   Yes [provider]  levothyroxine (SYNTHROID, LEVOTHROID) 125 MCG tablet Take 125 mcg by mouth daily. Alternate with 158mcg 06/24/16  Yes [provider]  lidocaine (XYLOCAINE) 2 % solution Use as directed 15 mLs in the mouth or throat every 4 (four) hours as needed for mouth pain.   Yes [provider]  magnesium oxide (MAG-OX) 400 MG tablet Take  400 mg by mouth daily.   Yes [provider]  metoprolol succinate (TOPROL-XL) 25 MG 24 hr tablet Take 12.5 mg by mouth 2 (two) times daily.    Yes [provider]  omeprazole (PRILOSEC) 20 MG capsule Take 20 mg by mouth daily.  02/06/13  Yes [provider]  pravastatin (PRAVACHOL) 20 MG tablet Take 20 mg by mouth daily.   Yes Christain Sacramento, MD  warfarin (COUMADIN) 5 MG tablet Take 1 tablet (5 mg total) by mouth daily for 1 dose. Patient taking differently: Take 5-7.5 mg by mouth daily. Take 5 mg everyday except Tuesday take 7.5 mg. 06/20/17 02/13/18 Yes Annia Belt, MD    Family History Family History  Problem Relation Age of Onset  . Alzheimer's disease Mother   .  Heart disease Mother   . Hyperlipidemia Mother   . Hypertension Mother   . Cancer Maternal Grandmother        colon  . Heart disease Sister   . Hypertension Sister   . Heart attack Sister   . Heart disease Brother   . Heart attack Brother   . Cancer Daughter   . Hypertension Daughter   . Hypertension Son     Social History Social History   Tobacco Use  . Smoking status: Never Smoker  . Smokeless tobacco: Never Used  Substance Use Topics  . Alcohol use: No    Alcohol/week: 0.0 oz  . Drug use: No     Allergies   Bee venom; Hornet venom; Reglan [metoclopramide]; Vitamin k; Benadryl [diphenhydramine hcl]; Ciprofloxacin; Crestor [rosuvastatin calcium]; Cymbalta [duloxetine hcl]; Daypro [oxaprozin]; Doxycycline; Levofloxacin; Sulfonamide derivatives; and Zocor [simvastatin]   Review of Systems Review of Systems  Constitutional: Positive for activity change.  Musculoskeletal: Positive for arthralgias.  Skin: Positive for wound.  Hematological: Bruises/bleeds easily.  All other systems reviewed and are negative.    Physical Exam Updated Vital Signs BP 124/77   Pulse 82   Temp (!) 96.1 F (35.6 C) (Temporal)   Resp 14   Ht 5\' 5"  (1.651 m)   Wt 78.5 kg (173 lb)    SpO2 99%   BMI 28.79 kg/m   Physical Exam  Constitutional: She is oriented to person, place, and time. She appears well-developed.  HENT:  Head: Normocephalic and atraumatic.  Eyes: EOM are normal.  Neck: Normal range of motion. Neck supple.  Cardiovascular: Normal rate.  Pulmonary/Chest: Effort normal.  Abdominal: Bowel sounds are normal.  Musculoskeletal:  Patient is noted to have tenderness over her left foot with edema at the base of her metatarsal joint. She has generalized tenderness over the left lower extremity but no deformity.  Head to toe evaluation shows no hematoma, bleeding of the scalp, no facial abrasions, no spine step offs, crepitus of the chest or neck, no tenderness to palpation of the bilateral upper and lower extremities, no gross deformities, no chest tenderness, no pelvic pain.   Neurological: She is alert and oriented to person, place, and time.  Skin: Skin is warm and dry.  Nursing note and vitals reviewed.    ED Treatments / Results  Labs (all labs ordered are listed, but only abnormal results are displayed) Labs Reviewed  CBC - Abnormal; Notable for the following components:      Result Value   WBC 11.6 (*)    RBC 3.68 (*)    Hemoglobin 11.3 (*)    HCT 35.5 (*)    All other components within normal limits  BASIC METABOLIC PANEL - Abnormal; Notable for the following components:   Glucose, Bld 149 (*)    Calcium 8.0 (*)    GFR calc non Af Amer 57 (*)    All other components within normal limits  PROTIME-INR - Abnormal; Notable for the following components:   Prothrombin Time 24.8 (*)    All other components within normal limits    EKG None  Radiology Dg Ribs Unilateral W/chest Left  Result Date: 02/13/2018 CLINICAL DATA:  Pain after a fall. EXAM: LEFT RIBS AND CHEST - 3+ VIEW COMPARISON:  None. FINDINGS: Shallow inspiration. Normal heart size and pulmonary vascularity. No focal airspace disease or consolidation in the lungs. No blunting of  costophrenic angles. No pneumothorax. Mediastinal contours appear intact. Surgical clips over the right axillary region and left  upper quadrant. Calcification of the aorta. Degenerative changes in the spine. The left ribs appear intact. No acute displaced fractures identified. No focal bone lesion or bone destruction. Soft tissues are unremarkable. IMPRESSION: No evidence of active pulmonary disease. Negative left ribs. Aortic atherosclerosis. Electronically Signed   By: Lucienne Capers M.D.   On: 02/13/2018 21:59   Dg Ankle Complete Left  Result Date: 02/13/2018 CLINICAL DATA:  Pain after a fall. EXAM: LEFT ANKLE COMPLETE - 3+ VIEW COMPARISON:  None. FINDINGS: There is no evidence of fracture, dislocation, or joint effusion. There is no evidence of arthropathy or other focal bone abnormality. Soft tissues are unremarkable. Plantar calcaneal spur. Old ununited ossicle adjacent to the cuboidal bone. Degenerative changes in the intertarsal joints. Vascular calcifications and phleboliths. IMPRESSION: No acute bony abnormalities.  Degenerative changes. Electronically Signed   By: Lucienne Capers M.D.   On: 02/13/2018 21:57   Ct Head Wo Contrast  Result Date: 02/13/2018 CLINICAL DATA:  Patient fell while getting out of a car. Patient does not recall hitting head and does not have any head pain currently. EXAM: CT HEAD WITHOUT CONTRAST CT CERVICAL SPINE WITHOUT CONTRAST TECHNIQUE: Multidetector CT imaging of the head and cervical spine was performed following the standard protocol without intravenous contrast. Multiplanar CT image reconstructions of the cervical spine were also generated. COMPARISON:  MRI brain 06/30/2017.  CT head 04/13/2016. FINDINGS: CT HEAD FINDINGS Brain: No evidence of acute infarction, hemorrhage, hydrocephalus, extra-axial collection or mass lesion/mass effect. Vascular: No hyperdense vessel or unexpected calcification. Skull: Normal. Negative for fracture or focal lesion.  Sinuses/Orbits: Mucosal thickening in the paranasal sinuses. No acute air-fluid levels. Mastoid air cells are clear. Other: None. CT CERVICAL SPINE FINDINGS Alignment: Normal. Skull base and vertebrae: No acute fracture. No primary bone lesion or focal pathologic process. Soft tissues and spinal canal: No prevertebral fluid or swelling. No visible canal hematoma. Disc levels: Degenerative changes in the cervical spine with narrowed disc spaces and endplate hypertrophic changes throughout. Degenerative changes are most prominent at C5-6 and C6-7 levels. Degenerative changes in the facet joints. Upper chest: Visualized lung apices are clear. Other: None. IMPRESSION: 1. No acute intracranial abnormalities. 2. Chronic mucosal thickening in the paranasal sinuses. 3. Normal alignment of the cervical spine. No acute displaced fractures identified. 4. Degenerative changes in the cervical spine. Electronically Signed   By: Lucienne Capers M.D.   On: 02/13/2018 22:36   Ct Cervical Spine Wo Contrast  Result Date: 02/13/2018 CLINICAL DATA:  Patient fell while getting out of a car. Patient does not recall hitting head and does not have any head pain currently. EXAM: CT HEAD WITHOUT CONTRAST CT CERVICAL SPINE WITHOUT CONTRAST TECHNIQUE: Multidetector CT imaging of the head and cervical spine was performed following the standard protocol without intravenous contrast. Multiplanar CT image reconstructions of the cervical spine were also generated. COMPARISON:  MRI brain 06/30/2017.  CT head 04/13/2016. FINDINGS: CT HEAD FINDINGS Brain: No evidence of acute infarction, hemorrhage, hydrocephalus, extra-axial collection or mass lesion/mass effect. Vascular: No hyperdense vessel or unexpected calcification. Skull: Normal. Negative for fracture or focal lesion. Sinuses/Orbits: Mucosal thickening in the paranasal sinuses. No acute air-fluid levels. Mastoid air cells are clear. Other: None. CT CERVICAL SPINE FINDINGS Alignment:  Normal. Skull base and vertebrae: No acute fracture. No primary bone lesion or focal pathologic process. Soft tissues and spinal canal: No prevertebral fluid or swelling. No visible canal hematoma. Disc levels: Degenerative changes in the cervical spine with narrowed disc spaces and endplate  hypertrophic changes throughout. Degenerative changes are most prominent at C5-6 and C6-7 levels. Degenerative changes in the facet joints. Upper chest: Visualized lung apices are clear. Other: None. IMPRESSION: 1. No acute intracranial abnormalities. 2. Chronic mucosal thickening in the paranasal sinuses. 3. Normal alignment of the cervical spine. No acute displaced fractures identified. 4. Degenerative changes in the cervical spine. Electronically Signed   By: Lucienne Capers M.D.   On: 02/13/2018 22:36   Ct Hip Left Wo Contrast  Result Date: 02/13/2018 CLINICAL DATA:  Left hip pain after fall. EXAM: CT OF THE LEFT HIP WITHOUT CONTRAST TECHNIQUE: Multidetector CT imaging of the left hip was performed according to the standard protocol. Multiplanar CT image reconstructions were also generated. COMPARISON:  Radiograph of same day.  CT scan of January 13, 2018. FINDINGS: No definite fracture or dislocation is noted. Bone fragment noted on radiographs over greater trochanter was present on prior CT scan and most likely represents small osteophyte or spur. Hip joint is unremarkable. Left sacroiliac joint appears normal. However, there does appear to be ill-defined high density material in the posterior musculature of the left hip region most consistent with hematoma. IMPRESSION: No fracture or dislocation is noted. Probable acute posttraumatic hematoma seen in the posterior musculature of the left hip region. Electronically Signed   By: Marijo Conception, M.D.   On: 02/13/2018 23:34   Dg Knee Complete 4 Views Left  Result Date: 02/13/2018 CLINICAL DATA:  Pain after a fall. EXAM: LEFT KNEE - COMPLETE 4+ VIEW COMPARISON:  None.  FINDINGS: Mild degenerative changes in the left knee with narrowed medial compartment and small osteophyte formation. No evidence of acute fracture or dislocation. No focal bone lesion or bone destruction. No significant effusion. Soft tissues are unremarkable. IMPRESSION: Mild degenerative changes in the left knee. No acute bony abnormalities. Electronically Signed   By: Lucienne Capers M.D.   On: 02/13/2018 21:58   Dg Foot Complete Left  Result Date: 02/13/2018 CLINICAL DATA:  Foot pain. EXAM: LEFT FOOT - COMPLETE 3+ VIEW COMPARISON:  None. FINDINGS: Mild hallux valgus deformities. There is acute angulation of the distal neck of the left second and third metatarsal bones likely representing mildly displaced fractures. Correlation with physical examination for point tenderness is suggested. Possible linear lucency over the fourth metatarsal head may represent a nondisplaced fracture as well. Degenerative changes of the first metatarsal-phalangeal joint and in the intertarsal joints. No additional fracture or dislocation is identified. Plantar calcaneal spur. Old ununited ossicle adjacent to the cuboidal bone. Mild dorsal soft tissue swelling. IMPRESSION: Probable fractures of the distal neck of the left second and third metatarsal bones. Possible nondisplaced fracture of the fourth metatarsal head. Correlation with physical examination for point tenderness is suggested. Electronically Signed   By: Lucienne Capers M.D.   On: 02/13/2018 22:05   Dg Hip Unilat With Pelvis 2-3 Views Left  Result Date: 02/13/2018 CLINICAL DATA:  Fall today.  Left hip pain. EXAM: DG HIP (WITH OR WITHOUT PELVIS) 2-3V LEFT COMPARISON:  None. FINDINGS: Degenerative changes in the lower lumbar spine and hips. There is a tiny osseous fragment over the greater trochanter of the left hip which may represent a focal avulsion fracture. No acute displaced fractures are identified in the pelvis or left hip. No dislocation. Visualized  sacrum appears intact. SI joints and symphysis pubis are not displaced. IMPRESSION: Tiny osseous fragment over the greater trochanter of the left hip may represent a focal avulsion fracture. Degenerative changes in the lower  lumbar spine and hips. Electronically Signed   By: Lucienne Capers M.D.   On: 02/13/2018 21:56    Procedures Procedures (including critical care time)  Medications Ordered in ED Medications  fentaNYL (SUBLIMAZE) injection 50 mcg (50 mcg Intravenous Given 02/13/18 2215)  sodium chloride 0.9 % bolus 1,000 mL (0 mLs Intravenous Stopped 02/13/18 2348)     Initial Impression / Assessment and Plan / ED Course  I have reviewed the triage vital signs and the nursing notes.  Pertinent labs & imaging results that were available during my care of the patient were reviewed by me and considered in my medical decision making (see chart for details).     DDx includes: - Mechanical falls - ICH - Fractures - Contusions - Soft tissue injury  Patient comes in after suffering a mechanical fall.  She is noted to have left-sided tenderness and appropriate imaging has been ordered.  Imaging results are overall reassuring.  Patient does have likely metatarsal fracture, and she will be put on a cam walker boot.   Final Clinical Impressions(s) / ED Diagnoses   Final diagnoses:  Contusion of left hip, initial encounter  Closed displaced fracture of phalanx of toe of left foot, unspecified toe, initial encounter    ED Discharge Orders    None       Varney Biles, MD 02/15/18 0302

## 2018-02-16 ENCOUNTER — Telehealth: Payer: Self-pay | Admitting: *Deleted

## 2018-02-16 NOTE — Telephone Encounter (Signed)
Patient notified of info below. Hubbard Hartshorn, RN, BSN

## 2018-02-16 NOTE — Telephone Encounter (Signed)
Patient called in reporting fall while getting out of vehicle on 02/13/2018. Has deep bruising from left hip to toes. Has "small hip fx", 3 toe fxs and deep bruising along 2 ribs. Was seen in ED. INR at that time was 2.26. Patient wanting to know if any adjustments need to be made to coumadin and when INR should be rechecked. Hubbard Hartshorn, RN, BSN

## 2018-02-16 NOTE — Telephone Encounter (Signed)
INR low therapeutic so no adjustment needed. She should repeat lab when she is due again. I will forward to Dr Elie Confer. DrG

## 2018-02-20 ENCOUNTER — Ambulatory Visit (INDEPENDENT_AMBULATORY_CARE_PROVIDER_SITE_OTHER): Payer: Medicare HMO | Admitting: Surgery

## 2018-02-20 ENCOUNTER — Ambulatory Visit (INDEPENDENT_AMBULATORY_CARE_PROVIDER_SITE_OTHER): Payer: Medicare HMO | Admitting: Orthopaedic Surgery

## 2018-02-20 VITALS — BP 127/74 | HR 78 | Ht 65.0 in | Wt 175.0 lb

## 2018-02-20 DIAGNOSIS — S92302A Fracture of unspecified metatarsal bone(s), left foot, initial encounter for closed fracture: Secondary | ICD-10-CM | POA: Diagnosis not present

## 2018-02-20 DIAGNOSIS — S7002XA Contusion of left hip, initial encounter: Secondary | ICD-10-CM

## 2018-02-20 NOTE — Progress Notes (Signed)
Office Visit Note   Patient: [name]           Date of Birth: 1946-05-08           MRN: [id number] Visit Date: 02/20/2018              Requested by: [name]., PA-C [address], [place] [address] PCP: [name]., PA-C   Assessment & Plan: Visit Diagnoses:  1. Multiple closed fractures of metatarsal bone of left foot, initial encounter   2. Contusion of left hip, initial encounter     Plan: She will continue fracture boot using a rolling walker.  Follow with Dr. due to in a couple weeks for recheck elevate foot as much as possible to help decrease pain and swelling.  Follow-Up Instructions: Return in about 2 weeks (around 03/06/2018) for With Dr. Sharol Given.   Orders:  No orders of the defined types were placed in this encounter.  No orders of the defined types were placed in this encounter.     Procedures: No procedures performed   Clinical Data: No additional findings.   Subjective: Chief Complaint  Patient presents with  . Left Leg - Pain    S/p fall 02/13/18 - ED visit, multiple xrays    HPI 72-year-old female patient of Dr. due to his comes in with complaints of left foot pain.  February 13, 2018 she suffered a fall and landed on her left side.  She was seen in the emergency room and diagnosed with probable fractures of the distal neck of the left second and third metatarsal bones.  Possible nondisplaced fracture of the fourth metatarsal head.  She had CT left hip performed and that showed no fracture or dislocation.  Probable acute posttraumatic hematoma seen in the posterior musculature of the left hip region.  Patient was put in a fracture boot and advised to follow-up in our clinic for evaluation of her foot.  She continues to have ongoing pain in the foot.  Currently taking Norco 5/325 as needed.  Denies balance issues, dizziness, loss of consciousness at the time of fall. Review of Systems No current cardiac pulmonary GI GU  issues  Objective: Vital Signs: BP 127/74 (BP Location: Left Arm, Patient Position: Sitting, Cuff Size: Large)   Pulse 78   Ht 5\' 5"  (1.651 m)   Wt 175 lb (79.4 kg)   BMI 29.12 kg/m   Physical Exam  Constitutional: No distress.  HENT:  Head: Normocephalic.  Eyes: Pupils are equal, round, and reactive to light. EOM are normal.  Neck: Normal range of motion.  Pulmonary/Chest: No respiratory distress.  Musculoskeletal:  Left foot she does have some swelling.  Mild bruising.  Exquisitely tender across the metatarsals.  Neurovascularly intact.  Neurological: She is alert.  Skin: Skin is warm and dry.    Ortho Exam  Specialty Comments:  No specialty comments available.  Imaging: No results found.   PMFS History: Patient Active Problem List   Diagnosis Date Noted  . Bilateral primary osteoarthritis of knee 12/27/2017  . Acute infection of nasal sinus 07/21/2017  . Prediabetes 06/29/2017  . Soft tissue mass 06/14/2017  . Displaced fracture of fifth metatarsal bone of right foot 02/07/2017  . Unilateral primary osteoarthritis, left knee 11/26/2016  . Unilateral primary osteoarthritis, right knee 11/26/2016  . Left leg pain   . Abnormal MRI of head   . Vision changes 04/13/2016  . Orthostatic hypotension 03/10/2016  . Vertigo 03/10/2016  .  Depression 11/25/2015  . Essential (primary) hypertension 11/25/2015  . Hyperlipidemia 11/25/2015  . Temporary cerebral vascular dysfunction 11/25/2015  . Primary malignant neoplasm ([place]) 11/25/2015  . Sleep apnea 11/25/2015  . Chronic anticoagulation 08/04/2015  . History of anticoagulant therapy 08/04/2015  . Pain in limb 12/26/2013  . Chest pain 11/27/2013  . GERD - post failed open Nissen 11/23/2012  . Dysphagia, unspecified(787.20) 09/28/2012  . Dysphagia 09/28/2012  . S/P Open Nissen 1998 Dr. Mendel Ryder 08/18/2012  . 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  Lupus anticoagulant positive 06/01/2011  . Chest pain   . Hx-TIA (transient ischemic attack)   . Hypothyroidism   . Breast cancer ([place])   . History of DVT (deep vein thrombosis)   . History of antiphospholipid antibody syndrome    Past Medical History:  Diagnosis Date  . Anxiety    severe panic attacks  . Arthritis   . Aseptic meningitis   . Asthma    as child  . Breast cancer ([place])    right  . Chronic anticoagulation 08/04/2015  . Collapsed lung    hx of, left  . Complication of anesthesia    "hard to put asleep"  . Depression   . Fibromyalgia   . GERD (gastroesophageal reflux disease)   . H/O hiatal hernia   . Headache(784.0)   . Hepatitis 1990   "from eating at restaurant"  . History of antiphospholipid antibody syndrome   . 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  . Hx-TIA (transient ischemic attack)   . Hyperlipidemia   . Hypertension   . Hypothyroidism   . Neuromuscular disorder ([place])   . Pneumonia    hx of  . Soft tissue mass 06/14/2017   3 cm right post axilla same side as previous breast cancer 06/14/17  . Stroke ([employer])   . 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 Alzheimer's disease Mother   . Heart disease Mother   . Hyperlipidemia Mother   . Hypertension Mother   . Cancer Maternal Grandmother        colon  . Heart disease Sister   . Hypertension Sister   . Heart attack Sister   . Heart disease Brother   . Heart attack Brother   . Cancer Daughter   . Hypertension Daughter   . Hypertension Son     Past Surgical History:  Procedure Laterality Date  . ABDOMINAL HYSTERECTOMY    . BREAST LUMPECTOMY Right 2000  . BREAST SURGERY Right    x3  . BUNIONECTOMY Bilateral 30 years ago  . CARDIAC CATHETERIZATION  07/12/1997   NORMAL LEFT VENTRICULAR FUNCTION WITH EF AT LEAST 70-75%  . ESOPHAGEAL  MANOMETRY N/A 11/06/2012   Procedure: ESOPHAGEAL MANOMETRY (EM);  Surgeon: Pedro Earls, MD;  Location: [facility];  Service: General;  Laterality: N/A;  . ESOPHAGOGASTRODUODENOSCOPY ENDOSCOPY    . KNEE ARTHROSCOPY Right   . LAPAROSCOPIC NISSEN FUNDOPLICATION N/A 01/31/6282   Procedure: LAPAROSCOPIC TAKEDOWN  PERI-HIATAL HERNIA    REPAIR;  Surgeon: Pedro Earls, MD;  Location: [facility];  Service: General;  Laterality: N/A;  . [place]  . RIGHT OOPHORECTOMY  1980  . THYROIDECTOMY  in 20's  . TONSILLECTOMY  72 years old  . UPPER GI ENDOSCOPY  N/A 12/12/2012   Procedure: UPPER GI ENDOSCOPY;  Surgeon: Pedro Earls, MD;  Location: [facility];  Service: General;  Laterality: N/A;   Social History   Occupational History  . Not on file  Tobacco Use  . Smoking status: Never Smoker  . Smokeless tobacco: Never Used  Substance and Sexual Activity  . Alcohol use: No    Alcohol/week: 0.0 standard drinks  . Drug use: No  . Sexual activity: Not on file

## 2018-02-20 NOTE — Patient Instructions (Signed)
Per Dr. Lannette Donath to weight-bear as tolerated left foot in boot.  Must elevate left foot above heart level as much as possible to help decrease pain and swelling.  Can use ice off and on as needed.  If your pain worsens you should contact our office immediately and schedule sooner appointment.

## 2018-02-27 ENCOUNTER — Ambulatory Visit (INDEPENDENT_AMBULATORY_CARE_PROVIDER_SITE_OTHER): Payer: Medicare HMO | Admitting: Pharmacist

## 2018-02-27 DIAGNOSIS — Z5181 Encounter for therapeutic drug level monitoring: Secondary | ICD-10-CM

## 2018-02-27 DIAGNOSIS — R760 Raised antibody titer: Secondary | ICD-10-CM

## 2018-02-27 DIAGNOSIS — D6861 Antiphospholipid syndrome: Secondary | ICD-10-CM | POA: Diagnosis not present

## 2018-02-27 DIAGNOSIS — Z7901 Long term (current) use of anticoagulants: Secondary | ICD-10-CM

## 2018-02-27 LAB — POCT INR: INR: 2.2 (ref 2.0–3.0)

## 2018-02-27 NOTE — Progress Notes (Signed)
Reviewed thanks [place]

## 2018-02-27 NOTE — Progress Notes (Signed)
Anticoagulation Management [name] is a 72 y.o. female who reports to the clinic for monitoring of warfarin treatment.    Indication: Antiphospholipid antibody syndrome ([place]) [D68.61]; Lupus anticoagulant positive [76.0]; chronic anticoagulation.  Duration: indefinite Supervising physician: Murriel Hopper  Anticoagulation Clinic Visit History: Patient does not report signs/symptoms of bleeding or thromboembolism  Other recent changes: No diet, medications, lifestyle changes endorsed.  Anticoagulation Episode Summary    Current INR goal:   2.0-3.0  TTR:   69.0 % (6.7 y)  Next INR check:   04/03/2018  INR from last check:   2.2 (02/27/2018)  Weekly max warfarin dose:     Target end date:     INR check location:     Preferred lab:     Send INR reminders to:      Indications   Antiphospholipid antibody syndrome ([facility]) [D68.61] Lupus anticoagulant positive [R76.0]       Comments:         Anticoagulation Care Providers    Provider Role Specialty Phone number   Annia Belt, MD Referring Oncology [phone]     Allergies  Allergen Reactions  . Bee Venom Anaphylaxis  . Hornet Venom Anaphylaxis    Anaphylaxis Shock SHOCK  . Reglan [Metoclopramide] Shortness Of Breath and Other (See Comments)    Tongue got numb; didn't feel good  . Vitamin K Anaphylaxis and Rash    "kills me" iv  . Benadryl [Diphenhydramine Hcl] Rash  . Ciprofloxacin Rash  . Crestor [Rosuvastatin Calcium] Other (See Comments)    Muscle Aches  . Cymbalta [Duloxetine Hcl] Swelling    Swelling of tongue and thrush  . Daypro [Oxaprozin] Rash    Rash  . Doxycycline Other (See Comments)    Tongue burning, thrush  . Levofloxacin Rash  . Sulfonamide Derivatives Rash  . Zocor [Simvastatin] Other (See Comments)    Muscle Aches   Medication Sig  ALPRAZolam (XANAX) 0.5 MG tablet Take 1 mg by mouth See admin instructions. Every evening and sometimes during the day if needed for anxiety   Ascorbic Acid (VITAMIN C) 1000 MG tablet Take 1,000 mg by mouth daily.  aspirin 81 MG tablet Take 81 mg by mouth daily at 6 PM.   CALCIUM CARBONATE PO Take 1 tablet by mouth daily.  Cyanocobalamin (VITAMIN B 12 PO) Take 0.5-1 mg by mouth daily.  fluticasone (FLONASE) 50 MCG/ACT nasal spray Place 2 sprays into the nose daily as needed for allergies.   gabapentin (NEURONTIN) 300 MG capsule TAKE 1 TO 2 CAPSULES THREE TIMES DAILY Patient taking differently: Take 300 mg by mouth 2 (two) times daily. Pt is tapering off  HYDROcodone-acetaminophen (NORCO/VICODIN) 5-325 MG per tablet Take 1 tablet by mouth 2 (two) times daily.   levothyroxine (SYNTHROID, LEVOTHROID) 112 MCG tablet Take 112 mcg by mouth daily before breakfast. Alternate with the 175mcg  levothyroxine (SYNTHROID, LEVOTHROID) 125 MCG tablet Take 125 mcg by mouth daily. Alternate with 142mcg  lidocaine (XYLOCAINE) 2 % solution Use as directed 15 mLs in the mouth or throat every 4 (four) hours as needed for mouth pain.  magnesium oxide (MAG-OX) 400 MG tablet Take 400 mg by mouth daily.  metoprolol succinate (TOPROL-XL) 25 MG 24 hr tablet Take 12.5 mg by mouth 2 (two) times daily.   omeprazole (PRILOSEC) 20 MG capsule Take 20 mg by mouth daily.   pravastatin (PRAVACHOL) 20 MG tablet Take 20 mg by mouth daily.  warfarin (COUMADIN) 5 MG tablet Take 1 tablet (5 mg total)  by mouth daily for 1 dose. Patient taking differently: Take 5-7.5 mg by mouth daily. Take 5 mg everyday except Tuesday take 7.5 mg.   Past Medical History:  Diagnosis Date  . Anxiety    severe panic attacks  . Arthritis   . Aseptic meningitis   . Asthma    as child  . Breast cancer ([place])    right  . Chronic anticoagulation 08/04/2015  . Collapsed lung    hx of, left  . Complication of anesthesia    "hard to put asleep"  . Depression   . Fibromyalgia   . GERD (gastroesophageal reflux disease)   . H/O hiatal hernia   . Headache(784.0)   . Hepatitis 1990   "from  eating at restaurant"  . History of antiphospholipid antibody syndrome   . 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  . Hx-TIA (transient ischemic attack)   . Hyperlipidemia   . Hypertension   . Hypothyroidism   . Neuromuscular disorder ([place])   . Pneumonia    hx of  . Soft tissue mass 06/14/2017   3 cm right post axilla same side as previous breast cancer 06/14/17  . Stroke ([place])   . 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 Marital status: Single    Spouse name: Not on file  . Number of children: 2  . Years of education: 53  . Highest education level: Not on file  Occupational History  . Not on file  Social Needs  . Financial resource strain: Not on file  . Food insecurity:    Worry: Not on file    Inability: Not on file  . Transportation needs:    Medical: Not on file    Non-medical: Not on file  Tobacco Use  . Smoking status: Never Smoker  . Smokeless tobacco: Never Used  Substance and Sexual Activity  . Alcohol use: No    Alcohol/week: 0.0 oz  . Drug use: No  . Sexual activity: Not on file  Lifestyle  . Physical activity:    Days per week: Not on file    Minutes per session: Not on file  . Stress: Not on file  Relationships  . Social connections:    Talks on phone: Not on file    Gets together: Not on file    Attends religious service: Not on file    Active member of club or organization: Not on file    Attends meetings of clubs or organizations: Not on file    Relationship status: Not on file  Other Topics Concern  . Not on file  Social History Narrative   Lives at home with her mother.  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   Right-handed.   2-4 cups caffeine daily.   Family History  Problem Relation Age of Onset  . Alzheimer's disease Mother   . Heart disease Mother   .  Hyperlipidemia Mother   . Hypertension Mother   . Cancer Maternal Grandmother        colon  . Heart disease Sister   . Hypertension Sister   . Heart attack Sister   . Heart disease Brother   . Heart attack Brother   . Cancer Daughter   . Hypertension Daughter   . Hypertension Son    ASSESSMENT Lab Results  Component Value Date   INR 2.2 02/27/2018   INR 2.26 02/13/2018   INR 2.2 01/30/2018   PROTIME 30.0 (H) 03/13/2015   Anticoagulation Dosing: Description   [name] (route warfarin notes and list as authorizing provider for LOS & Follow-up, lab orders and warfarin prescriptions), do not list attending physician.  Take one (1) of your 5mg  peach-colored warfarin tablets by mouth, once-daily at 6PM.      INR today: Therapeutic  PLAN Weekly dose was unchanged  Patient Instructions  Patient educated about medication as defined in this encounter and verbalized understanding by repeating back instructions provided.    Patient advised to contact clinic or seek medical attention if signs/symptoms of bleeding or thromboembolism occur.  Patient verbalized understanding by repeating back information and was advised to contact me if further medication-related questions arise. Patient was also provided an information handout.  Follow-up Return in about 5 weeks (around 04/03/2018).  [name]  15 minutes spent face-to-face with the patient during the encounter. 50% of time spent on education. 50% of time was spent on assessment and plan.

## 2018-02-27 NOTE — Patient Instructions (Signed)
Patient educated about medication as defined in this encounter and verbalized understanding by repeating back instructions provided.   

## 2018-03-06 ENCOUNTER — Encounter (INDEPENDENT_AMBULATORY_CARE_PROVIDER_SITE_OTHER): Payer: Self-pay | Admitting: Orthopedic Surgery

## 2018-03-06 ENCOUNTER — Ambulatory Visit (INDEPENDENT_AMBULATORY_CARE_PROVIDER_SITE_OTHER): Payer: Medicare HMO | Admitting: Orthopedic Surgery

## 2018-03-06 ENCOUNTER — Ambulatory Visit (INDEPENDENT_AMBULATORY_CARE_PROVIDER_SITE_OTHER): Payer: Medicare HMO

## 2018-03-06 VITALS — Ht 65.0 in | Wt 175.0 lb

## 2018-03-06 DIAGNOSIS — M79672 Pain in left foot: Secondary | ICD-10-CM

## 2018-03-20 ENCOUNTER — Encounter (INDEPENDENT_AMBULATORY_CARE_PROVIDER_SITE_OTHER): Payer: Self-pay | Admitting: Orthopedic Surgery

## 2018-03-20 NOTE — Progress Notes (Signed)
Office Visit Note   Patient: [name]           Date of Birth: 1946/01/19           MRN: [id number] Visit Date: 03/06/2018              Requested by: [name]., PA-C [address], [place] [address] PCP: [name]., PA-C  Chief Complaint  Patient presents with  . Left Knee - Follow-up    S/p cortisone injection 12/27/17  . Right Knee - Follow-up  . Left Foot - Pain    S/p fall 12/2017 fx 2nd -5 th       HPI: Patient is a 72 year old woman who presents complaining of left foot pain.  She is status post bilateral knee injections approximately 2 months ago patient states that the injections have helped.  Patient also fell back in June sustaining injury to her ribs and hip.  Patient has recently been placed in a fracture boot for metatarsal neck fractures of the left foot.  Assessment & Plan: Visit Diagnoses:  1. Pain in left foot     Plan: Continue with the fracture boot for the left foot metatarsal fractures follow-up in 3 weeks with repeat three-view radiographs of the left foot at which time anticipate she can be advanced to regular shoewear.  Follow-Up Instructions: Return in about 3 weeks (around 03/27/2018).   Ortho Exam  Patient is alert, oriented, no adenopathy, well-dressed, normal affect, normal respiratory effort. Patient states she still has some hip and back pain after her fall.  Patient is tender to palpation across the forefoot.  Radiographs shows stress fractures through the second and third metatarsal.  Patient has good pulses  Imaging: No results found. No images are attached to the encounter.  Labs: Lab Results  Component Value Date   HGBA1C 6.3 (H) 06/28/2017   HGBA1C 6.0 (H) 04/14/2016   ESRSEDRATE 5 06/28/2017   ESRSEDRATE 11 04/18/2016   ESRSEDRATE 11 11/04/2009   CRP 3.6 06/28/2017   CRP 1.3 (H) 04/18/2016   REPTSTATUS 04/20/2016 FINAL 04/16/2016   GRAMSTAIN  04/16/2016    CYTOSPIN SMEAR WBC PRESENT,  PREDOMINANTLY MONONUCLEAR NO ORGANISMS SEEN    CULT NO GROWTH 3 DAYS 04/16/2016     Lab Results  Component Value Date   ALBUMIN 4.2 01/11/2018   ALBUMIN 4.1 05/23/2017   ALBUMIN 4.6 01/31/2017    Body mass index is 29.12 kg/m.  Orders:  Orders Placed This Encounter  Procedures  . XR Foot 2 Views Left   No orders of the defined types were placed in this encounter.    Procedures: No procedures performed  Clinical Data: No additional findings.  ROS:  All other systems negative, except as noted in the HPI. Review of Systems  Objective: Vital Signs: Ht 5\' 5"  (1.651 m)   Wt 175 lb (79.4 kg)   BMI 29.12 kg/m   Specialty Comments:  No specialty comments available.  PMFS History: Patient Active Problem List   Diagnosis Date Noted  . Bilateral primary osteoarthritis of knee 12/27/2017  . Acute infection of nasal sinus 07/21/2017  . Prediabetes 06/29/2017  . Soft tissue mass 06/14/2017  . Displaced fracture of fifth metatarsal bone of right foot 02/07/2017  . Unilateral primary osteoarthritis, left knee 11/26/2016  . Unilateral primary osteoarthritis, right knee 11/26/2016  . Left leg pain   . Abnormal MRI of head   . Vision changes 04/13/2016  . Orthostatic hypotension 03/10/2016  .  Vertigo 03/10/2016  . Depression 11/25/2015  . Essential (primary) hypertension 11/25/2015  . Hyperlipidemia 11/25/2015  . Temporary cerebral vascular dysfunction 11/25/2015  . Primary malignant neoplasm ([place]) 11/25/2015  . Sleep apnea 11/25/2015  . Chronic anticoagulation 08/04/2015  . History of anticoagulant therapy 08/04/2015  . Pain in limb 12/26/2013  . Chest pain 11/27/2013  . GERD - post failed open Nissen 11/23/2012  . Dysphagia, unspecified(787.20) 09/28/2012  . Dysphagia 09/28/2012  . S/P Open Nissen 1998 Dr. Mendel Ryder 08/18/2012  . 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 Lupus anticoagulant  positive 06/01/2011  . Chest pain   . Hx-TIA (transient ischemic attack)   . Hypothyroidism   . Breast cancer ([place])   . History of DVT (deep vein thrombosis)   . History of antiphospholipid antibody syndrome    Past Medical History:  Diagnosis Date  . Anxiety    severe panic attacks  . Arthritis   . Aseptic meningitis   . Asthma    as child  . Breast cancer ([place])    right  . Chronic anticoagulation 08/04/2015  . Collapsed lung    hx of, left  . Complication of anesthesia    "hard to put asleep"  . Depression   . Fibromyalgia   . GERD (gastroesophageal reflux disease)   . H/O hiatal hernia   . Headache(784.0)   . Hepatitis 1990   "from eating at restaurant"  . History of antiphospholipid antibody syndrome   . 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  . Hx-TIA (transient ischemic attack)   . Hyperlipidemia   . Hypertension   . Hypothyroidism   . Neuromuscular disorder ([place])   . Pneumonia    hx of  . Soft tissue mass 06/14/2017   3 cm right post axilla same side as previous breast cancer 06/14/17  . Stroke ([place])   . 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 Alzheimer's disease Mother   . Heart disease Mother   . Hyperlipidemia Mother   . Hypertension Mother   . Cancer Maternal Grandmother        colon  . Heart disease Sister   . Hypertension Sister   . Heart attack Sister   . Heart disease Brother   . Heart attack Brother   . Cancer Daughter   . Hypertension Daughter   . Hypertension Son     Past Surgical History:  Procedure Laterality Date  . ABDOMINAL HYSTERECTOMY    . BREAST LUMPECTOMY Right 2000  . BREAST SURGERY Right    x3  . BUNIONECTOMY Bilateral 30 years ago  . CARDIAC CATHETERIZATION  07/12/1997   NORMAL LEFT VENTRICULAR FUNCTION WITH EF AT LEAST 70-75%  . ESOPHAGEAL MANOMETRY N/A  11/06/2012   Procedure: ESOPHAGEAL MANOMETRY (EM);  Surgeon: Pedro Earls, MD;  [facility];  Service: General;  Laterality: N/A;  . ESOPHAGOGASTRODUODENOSCOPY ENDOSCOPY    . KNEE ARTHROSCOPY Right   . LAPAROSCOPIC NISSEN FUNDOPLICATION N/A 6/65/9935   Procedure: LAPAROSCOPIC TAKEDOWN  PERI-HIATAL HERNIA    REPAIR;  Surgeon: Pedro Earls, MD;  Location: [facility];  Service: General;  Laterality: N/A;  . [place]  . RIGHT OOPHORECTOMY  1980  . THYROIDECTOMY  in 20's  . TONSILLECTOMY  72 years old  .  UPPER GI ENDOSCOPY N/A 12/12/2012   Procedure: UPPER GI ENDOSCOPY;  Surgeon: Pedro Earls, MD;  Location: [facility];  Service: General;  Laterality: N/A;   Social History   Occupational History  . Not on file  Tobacco Use  . Smoking status: Never Smoker  . Smokeless tobacco: Never Used  Substance and Sexual Activity  . Alcohol use: No    Alcohol/week: 0.0 standard drinks  . Drug use: No  . Sexual activity: Not on file

## 2018-03-29 ENCOUNTER — Ambulatory Visit (INDEPENDENT_AMBULATORY_CARE_PROVIDER_SITE_OTHER): Payer: Medicare HMO | Admitting: Orthopedic Surgery

## 2018-04-03 ENCOUNTER — Ambulatory Visit: Payer: Medicare HMO

## 2018-04-10 ENCOUNTER — Ambulatory Visit: Payer: Medicare HMO

## 2018-04-10 ENCOUNTER — Ambulatory Visit (INDEPENDENT_AMBULATORY_CARE_PROVIDER_SITE_OTHER): Payer: Medicare HMO | Admitting: Orthopedic Surgery

## 2018-04-14 ENCOUNTER — Telehealth: Payer: Self-pay | Admitting: *Deleted

## 2018-04-14 NOTE — Telephone Encounter (Signed)
Noted thanks °

## 2018-04-14 NOTE — Telephone Encounter (Signed)
Call from pt wanting INR check; stated "something is going on", very anxious . She's in [place] - wanting to know if she get INR check at the local hospital's lab, not going to urgent care.  Talked to Dr Beryle Beams - stated needs to go to the ER or UC if she's having a problem so she could eval if there is a problem.  Called pt back - she agreed to go to the local urgent care ctr.

## 2018-04-25 ENCOUNTER — Telehealth: Payer: Self-pay | Admitting: *Deleted

## 2018-04-25 NOTE — Telephone Encounter (Signed)
Call from pt - stated she went to the ED when she called on 9/20.Her INR was 3.45 and was told to stay on same dosage. She wants to know when should she come in for the next one?

## 2018-04-25 NOTE — Telephone Encounter (Signed)
She can come in this week or keep her regular coag check appt w Dr Elie Confer.

## 2018-04-26 NOTE — Telephone Encounter (Signed)
Pls contact pt # [phone]

## 2018-04-26 NOTE — Telephone Encounter (Signed)
Patient given appt with Dr. Elie Confer for PT/INR on 05/01/2018. Hubbard Hartshorn, RN, BSN

## 2018-04-26 NOTE — Telephone Encounter (Signed)
OK thanks DrG 

## 2018-05-01 ENCOUNTER — Ambulatory Visit (INDEPENDENT_AMBULATORY_CARE_PROVIDER_SITE_OTHER): Payer: Medicare HMO | Admitting: Pharmacist

## 2018-05-01 ENCOUNTER — Other Ambulatory Visit: Payer: Self-pay | Admitting: Pharmacist

## 2018-05-01 DIAGNOSIS — Z5181 Encounter for therapeutic drug level monitoring: Secondary | ICD-10-CM | POA: Diagnosis not present

## 2018-05-01 DIAGNOSIS — D6861 Antiphospholipid syndrome: Secondary | ICD-10-CM

## 2018-05-01 DIAGNOSIS — Z7901 Long term (current) use of anticoagulants: Secondary | ICD-10-CM

## 2018-05-01 DIAGNOSIS — R760 Raised antibody titer: Secondary | ICD-10-CM | POA: Diagnosis not present

## 2018-05-01 LAB — POCT INR: INR: 2.9 (ref 2.0–3.0)

## 2018-05-01 NOTE — Progress Notes (Signed)
Reviewed Thanks DrG 

## 2018-05-01 NOTE — Progress Notes (Signed)
Anticoagulation Management [name] is a 72 y.o. female who reports to the clinic for monitoring of warfarin treatment.    Indication: Antiphospholipid antibody syndrome, Lupus anticoagulant positive; long term (current) use of anticoagulant.    Duration: indefinite Supervising physician: Murriel Hopper  Anticoagulation Clinic Visit History: Patient does not report signs/symptoms of bleeding or thromboembolism  Other recent changes: No diet, medications, lifestyle changes. Anticoagulation Episode Summary    Current INR goal:   2.0-3.0  TTR:   69.7 % (6.8 y)  Next INR check:   04/03/2018  INR from last check:   2.9 (05/01/2018)  Weekly max warfarin dose:     Target end date:     INR check location:     Preferred lab:     Send INR reminders to:      Indications   Antiphospholipid antibody syndrome (HCC) [D68.61] Lupus anticoagulant positive [R76.0]       Comments:         Anticoagulation Care Providers    Provider Role Specialty Phone number   Annia Belt, MD Referring Oncology [phone]      Allergies  Allergen Reactions  . Bee Venom Anaphylaxis  . Hornet Venom Anaphylaxis    Anaphylaxis Shock SHOCK  . Reglan [Metoclopramide] Shortness Of Breath and Other (See Comments)    Tongue got numb; didn't feel good  . Vitamin K Anaphylaxis and Rash    "kills me" iv  . Benadryl [Diphenhydramine Hcl] Rash  . Ciprofloxacin Rash  . Crestor [Rosuvastatin Calcium] Other (See Comments)    Muscle Aches  . Cymbalta [Duloxetine Hcl] Swelling    Swelling of tongue and thrush  . Daypro [Oxaprozin] Rash    Rash  . Doxycycline Other (See Comments)    Tongue burning, thrush  . Levofloxacin Rash  . Sulfonamide Derivatives Rash  . Zocor [Simvastatin] Other (See Comments)    Muscle Aches   Prior to Admission medications   Medication Sig Start Date End Date Taking? Authorizing Provider  ALPRAZolam [name]) 0.5 MG tablet Take 1 mg by mouth See admin instructions.  Every evening and sometimes during the day if needed for anxiety 06/07/12  Yes [provider]  Ascorbic Acid (VITAMIN C) 1000 MG tablet Take 1,000 mg by mouth daily.   Yes [provider]  aspirin 81 MG tablet Take 81 mg by mouth daily at 6 PM.    Yes [provider]  CALCIUM CARBONATE PO Take 1 tablet by mouth daily.   Yes [provider]  Cyanocobalamin (VITAMIN B 12 PO) Take 0.5-1 mg by mouth daily.   Yes [provider]  fluticasone (FLONASE) 50 MCG/ACT nasal spray Place 2 sprays into the nose daily as needed for allergies.  10/16/14  Yes [provider]  gabapentin (NEURONTIN) 300 MG capsule TAKE 1 TO 2 CAPSULES THREE TIMES DAILY Patient taking differently: Take 300 mg by mouth 2 (two) times daily. Pt is tapering off 10/21/17  Yes Marcial Pacas, MD  HYDROcodone-acetaminophen (NORCO/VICODIN) 5-325 MG per tablet Take 1 tablet by mouth 2 (two) times daily.    Yes [provider]  levothyroxine (SYNTHROID, LEVOTHROID) 112 MCG tablet Take 112 mcg by mouth daily before breakfast. Alternate with the 187mcg   Yes [provider]  levothyroxine (SYNTHROID, LEVOTHROID) 125 MCG tablet Take 125 mcg by mouth daily. Alternate with 155mcg 06/24/16  Yes [provider]  lidocaine (XYLOCAINE) 2 % solution Use as directed 15 mLs in the mouth or throat every 4 (four)  hours as needed for mouth pain.   Yes [provider]  magnesium oxide (MAG-OX) 400 MG tablet Take 400 mg by mouth daily.   Yes [provider]  metoprolol succinate (TOPROL-XL) 25 MG 24 hr tablet Take 12.5 mg by mouth 2 (two) times daily.    Yes [provider]  omeprazole (PRILOSEC) 20 MG capsule Take 20 mg by mouth daily.  02/06/13  Yes [provider]  pravastatin (PRAVACHOL) 20 MG tablet Take 20 mg by mouth daily.   Yes Christain Sacramento, MD  warfarin (COUMADIN) 5 MG tablet Take 1 tablet (5 mg total) by mouth daily for 1 dose. Patient  taking differently: Take 5-7.5 mg by mouth daily. Take 5 mg everyday except Tuesday take 7.5 mg. 06/20/17 02/13/18  Annia Belt, MD   Past Medical History:  Diagnosis Date  . Anxiety    severe panic attacks  . Arthritis   . Aseptic meningitis   . Asthma    as child  . Breast cancer ([place])    right  . Chronic anticoagulation 08/04/2015  . Collapsed lung    hx of, left  . Complication of anesthesia    "hard to put asleep"  . Depression   . Fibromyalgia   . GERD (gastroesophageal reflux disease)   . H/O hiatal hernia   . Headache(784.0)   . Hepatitis 1990   "from eating at restaurant"  . History of antiphospholipid antibody syndrome   . 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  . Hx-TIA (transient ischemic attack)   . Hyperlipidemia   . Hypertension   . Hypothyroidism   . Neuromuscular disorder ([place])   . Pneumonia    hx of  . Soft tissue mass 06/14/2017   3 cm right post axilla same side as previous breast cancer 06/14/17  . Stroke ([place])   . 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 Marital status: Single    Spouse name: Not on file  . Number of children: 2  . Years of education: 65  . Highest education level: Not on file  Occupational History  . Not on file  Social Needs  . Financial resource strain: Not on file  . Food insecurity:    Worry: Not on file    Inability: Not on file  . Transportation needs:    Medical: Not on file    Non-medical: Not on file  Tobacco Use  . Smoking status: Never Smoker  . Smokeless tobacco: Never Used  Substance and Sexual Activity  . Alcohol use: No    Alcohol/week: 0.0 standard drinks  . Drug use: No  . Sexual activity: Not on file  Lifestyle  . Physical activity:    Days per week: Not on file    Minutes per session: Not on file  . Stress: Not on file  Relationships   . Social connections:    Talks on phone: Not on file    Gets together: Not on file    Attends religious service: Not on file    Active member of club or organization: Not on file    Attends meetings of clubs or organizations: Not on file    Relationship status: Not on file  Other Topics Concern  . Not on file  Social History Narrative   Lives at home with her  mother.  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   Right-handed.   2-4 cups caffeine daily.   Family History  Problem Relation Age of Onset  . Alzheimer's disease Mother   . Heart disease Mother   . Hyperlipidemia Mother   . Hypertension Mother   . Cancer Maternal Grandmother        colon  . Heart disease Sister   . Hypertension Sister   . Heart attack Sister   . Heart disease Brother   . Heart attack Brother   . Cancer Daughter   . Hypertension Daughter   . Hypertension Son     ASSESSMENT Recent Results: The most recent result is correlated with 35 mg per week: Lab Results  Component Value Date   INR 2.9 05/01/2018   INR 2.2 02/27/2018   INR 2.26 02/13/2018   PROTIME 30.0 (H) 03/13/2015    Anticoagulation Dosing: Description   DR [id number]'F PATIENT (route warfarin notes and list as authorizing provider for LOS & Follow-up, lab orders and warfarin prescriptions), do not list attending physician.  Take one (1) of your 5mg  peach-colored warfarin tablets by mouth, once-daily at 6PM--EXCEPT on MONDAYS, take ONLY 1/2 of your 5mg  peach-colored warfarin tablet on MONDAYS of each week.       INR today: Therapeutic  PLAN Weekly dose was decreased by 7% to 32.5 mg per week  Patient Instructions  Patient instructed to take medications as defined in the Anti-coagulation Track section of this encounter.  Patient instructed to take today's dose.  Patient instructed to take one (1) of your 5mg  peach-colored warfarin tablets by mouth, once-daily at 6PM--EXCEPT on MONDAYS, take ONLY 1/2 of your 5mg   peach-colored warfarin tablet on MONDAYS of each week. Patient verbalized understanding of these instructions.     Patient advised to contact clinic or seek medical attention if signs/symptoms of bleeding or thromboembolism occur.  Patient verbalized understanding by repeating back information and was advised to contact me if further medication-related questions arise. Patient was also provided an information handout.  Follow-up Return in 4 weeks (on 05/29/2018) for Follow up INR at 1400H.  Pennie Banter, CACP, CPP  15 minutes spent face-to-face with the patient during the encounter. 50% of time spent on education. 50% of time was spent on fingerstick point of care INR sample collection, processing, results determination, dose adjustment and documentation in http://www.kim.net/.

## 2018-05-01 NOTE — Patient Instructions (Signed)
Patient instructed to take medications as defined in the Anti-coagulation Track section of this encounter.  Patient instructed to take today's dose.  Patient instructed to take one (1) of your 5mg  peach-colored warfarin tablets by mouth, once-daily at 6PM--EXCEPT on MONDAYS, take ONLY 1/2 of your 5mg  peach-colored warfarin tablet on MONDAYS of each week. Patient verbalized understanding of these instructions.

## 2018-05-10 ENCOUNTER — Other Ambulatory Visit: Payer: Self-pay | Admitting: Oncology

## 2018-05-10 DIAGNOSIS — I742 Embolism and thrombosis of arteries of the upper extremities: Secondary | ICD-10-CM

## 2018-05-10 DIAGNOSIS — D6861 Antiphospholipid syndrome: Secondary | ICD-10-CM

## 2018-05-11 ENCOUNTER — Telehealth: Payer: Self-pay

## 2018-05-11 NOTE — Telephone Encounter (Signed)
Requesting to speak with a nurse about med. Please call pt back.  

## 2018-05-11 NOTE — Telephone Encounter (Signed)
Pt states Dr Elie Confer wanted to know what medications Dr Deatra Ina put her own for her, cough,wheezing. Zithromax 250 mg 2 tabs on first day then 1 tab x 4 more days (total of 5 days); given decadron shot and Hycodan 5 mls at bedtime as needed.

## 2018-05-29 ENCOUNTER — Ambulatory Visit (INDEPENDENT_AMBULATORY_CARE_PROVIDER_SITE_OTHER): Payer: Medicare HMO | Admitting: Pharmacist

## 2018-05-29 DIAGNOSIS — D6861 Antiphospholipid syndrome: Secondary | ICD-10-CM

## 2018-05-29 DIAGNOSIS — R760 Raised antibody titer: Secondary | ICD-10-CM | POA: Diagnosis not present

## 2018-05-29 DIAGNOSIS — Z7901 Long term (current) use of anticoagulants: Secondary | ICD-10-CM

## 2018-05-29 DIAGNOSIS — Z5181 Encounter for therapeutic drug level monitoring: Secondary | ICD-10-CM | POA: Diagnosis not present

## 2018-05-29 LAB — POCT INR: INR: 1.8 — AB (ref 2.0–3.0)

## 2018-05-29 NOTE — Patient Instructions (Signed)
Patient instructed to take medications as defined in the Anti-coagulation Track section of this encounter.  Patient instructed to take today's dose.  Patient instructed to take one (1) of your 5mg peach-colored warfarin tablets by mouth, once-daily at 6PM. Patient verbalized understanding of these instructions.    

## 2018-05-29 NOTE — Progress Notes (Signed)
Anticoagulation Management [name] is a 72 y.o. female who reports to the clinic for monitoring of warfarin treatment.    Indication: Antiphospholipid antibody syndrome, Lupus anticoagulant positive, history of VTE, long term current use of anticoagulant.    Duration: indefinite Supervising physician: Murriel Hopper  Anticoagulation Clinic Visit History: Patient does not report signs/symptoms of bleeding or thromboembolism  Other recent changes: No diet, medications, lifestyle changes endorsed. Anticoagulation Episode Summary    Current INR goal:   2.0-3.0  TTR:   69.9 % (6.9 y)  Next INR check:   06/26/2018  INR from last check:   1.8! (05/29/2018)  Weekly max warfarin dose:     Target end date:     INR check location:     Preferred lab:     Send INR reminders to:      Indications   Antiphospholipid antibody syndrome (HCC) [D68.61] Lupus anticoagulant positive [R76.0]       Comments:         Anticoagulation Care Providers    Provider Role Specialty Phone number   Annia Belt, MD Referring Oncology [phone]      Allergies  Allergen Reactions  . Bee Venom Anaphylaxis  . Hornet Venom Anaphylaxis    Anaphylaxis Shock SHOCK  . Reglan [Metoclopramide] Shortness Of Breath and Other (See Comments)    Tongue got numb; didn't feel good  . Vitamin K Anaphylaxis and Rash    "kills me" iv  . Benadryl [Diphenhydramine Hcl] Rash  . Ciprofloxacin Rash  . Crestor [Rosuvastatin Calcium] Other (See Comments)    Muscle Aches  . Cymbalta [Duloxetine Hcl] Swelling    Swelling of tongue and thrush  . Daypro [Oxaprozin] Rash    Rash  . Doxycycline Other (See Comments)    Tongue burning, thrush  . Levofloxacin Rash  . Sulfonamide Derivatives Rash  . Zocor [Simvastatin] Other (See Comments)    Muscle Aches   Prior to Admission medications   Medication Sig Start Date End Date Taking? Authorizing Provider  ALPRAZolam [name]) 0.5 MG tablet Take 1 mg by mouth  See admin instructions. Every evening and sometimes during the day if needed for anxiety 06/07/12  Yes [provider]  Ascorbic Acid (VITAMIN C) 1000 MG tablet Take 1,000 mg by mouth daily.   Yes [provider]  aspirin 81 MG tablet Take 81 mg by mouth daily at 6 PM.    Yes [provider]  CALCIUM CARBONATE PO Take 1 tablet by mouth daily.   Yes [provider]  Cyanocobalamin (VITAMIN B 12 PO) Take 0.5-1 mg by mouth daily.   Yes [provider]  fluticasone (FLONASE) 50 MCG/ACT nasal spray Place 2 sprays into the nose daily as needed for allergies.  10/16/14  Yes [provider]  gabapentin (NEURONTIN) 300 MG capsule TAKE 1 TO 2 CAPSULES THREE TIMES DAILY Patient taking differently: Take 300 mg by mouth 2 (two) times daily. Pt is tapering off 10/21/17  Yes Marcial Pacas, MD  HYDROcodone-acetaminophen (NORCO/VICODIN) 5-325 MG per tablet Take 1 tablet by mouth 2 (two) times daily.    Yes [provider]  levothyroxine (SYNTHROID, LEVOTHROID) 112 MCG tablet Take 112 mcg by mouth daily before breakfast. Alternate with the 143mcg   Yes [provider]  levothyroxine (SYNTHROID, LEVOTHROID) 125 MCG tablet Take 125 mcg by mouth daily. Alternate with 165mcg 06/24/16  Yes [provider]  lidocaine (XYLOCAINE) 2 % solution Use as directed 15 mLs in the mouth or  throat every 4 (four) hours as needed for mouth pain.   Yes [provider]  magnesium oxide (MAG-OX) 400 MG tablet Take 400 mg by mouth daily.   Yes [provider]  metoprolol succinate (TOPROL-XL) 25 MG 24 hr tablet Take 12.5 mg by mouth 2 (two) times daily.    Yes [provider]  omeprazole (PRILOSEC) 20 MG capsule Take 20 mg by mouth daily.  02/06/13  Yes [provider]  pravastatin (PRAVACHOL) 20 MG tablet Take 20 mg by mouth daily.   Yes Christain Sacramento, MD  warfarin (COUMADIN) 5 MG tablet Take 5 mg daily except on Mondays: take  2.5 mg (1/2 tablet) 05/11/18  Yes Annia Belt, MD   Past Medical History:  Diagnosis Date  . Anxiety    severe panic attacks  . Arthritis   . Aseptic meningitis   . Asthma    as child  . Breast cancer ([place])    right  . Chronic anticoagulation 08/04/2015  . Collapsed lung    hx of, left  . Complication of anesthesia    "hard to put asleep"  . Depression   . Fibromyalgia   . GERD (gastroesophageal reflux disease)   . H/O hiatal hernia   . Headache(784.0)   . Hepatitis 1990   "from eating at restaurant"  . History of antiphospholipid antibody syndrome   . 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  . Hx-TIA (transient ischemic attack)   . Hyperlipidemia   . Hypertension   . Hypothyroidism   . Neuromuscular disorder ([place])   . Pneumonia    hx of  . Soft tissue mass 06/14/2017   3 cm right post axilla same side as previous breast cancer 06/14/17  . Stroke ([place])   . 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 Marital status: Single    Spouse name: Not on file  . Number of children: 2  . Years of education: 37  . Highest education level: Not on file  Occupational History  . Not on file  Social Needs  . Financial resource strain: Not on file  . Food insecurity:    Worry: Not on file    Inability: Not on file  . Transportation needs:    Medical: Not on file    Non-medical: Not on file  Tobacco Use  . Smoking status: Never Smoker  . Smokeless tobacco: Never Used  Substance and Sexual Activity  . Alcohol use: No    Alcohol/week: 0.0 standard drinks  . Drug use: No  . Sexual activity: Not on file  Lifestyle  . Physical activity:    Days per week: Not on file    Minutes per session: Not on file  . Stress: Not on file  Relationships  . Social connections:    Talks on phone: Not on file    Gets together: Not on  file    Attends religious service: Not on file    Active member of club or organization: Not on file    Attends meetings of clubs or organizations: Not on file    Relationship status: Not on file  Other Topics Concern  . Not on file  Social History Narrative   Lives at home with her mother.  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   Right-handed.  2-4 cups caffeine daily.   Family History  Problem Relation Age of Onset  . Alzheimer's disease Mother   . Heart disease Mother   . Hyperlipidemia Mother   . Hypertension Mother   . Cancer Maternal Grandmother        colon  . Heart disease Sister   . Hypertension Sister   . Heart attack Sister   . Heart disease Brother   . Heart attack Brother   . Cancer Daughter   . Hypertension Daughter   . Hypertension Son     ASSESSMENT Recent Results: The most recent result is correlated with 32.5 mg per week: Lab Results  Component Value Date   INR 1.8 (A) 05/29/2018   INR 2.9 05/01/2018   INR 2.2 02/27/2018   PROTIME 30.0 (H) 03/13/2015    Anticoagulation Dosing: Description   DR [id number]'O PATIENT (route warfarin notes and list as authorizing provider for LOS & Follow-up, lab orders and warfarin prescriptions), do not list attending physician.  Take one (1) of your 5mg  peach-colored warfarin tablets by mouth, once-daily at 6PM.     INR today: Subtherapeutic  PLAN Weekly dose was increased by 8% to 35 mg per week  Patient Instructions  Patient instructed to take medications as defined in the Anti-coagulation Track section of this encounter.  Patient instructed to take today's dose.  Patient instructed to take  one (1) of your 5mg  peach-colored warfarin tablets by mouth, once-daily at [facility]. Patient verbalized understanding of these instructions.     Patient advised to contact clinic or seek medical attention if signs/symptoms of bleeding or thromboembolism occur.  Patient verbalized understanding by  repeating back information and was advised to contact me if further medication-related questions arise. Patient was also provided an information handout.  Follow-up Return in 4 weeks (on 06/26/2018) for Follow up INR at 2:15PM.  Pennie Banter, PharmD, CPP  15 minutes spent face-to-face with the patient during the encounter. 50% of time spent on education. 50% of time was spent on fingerstick pont of care INR sample collection, processing, results determination, dose adjustment and documentation in [email or web address].kim.net/.

## 2018-05-29 NOTE — Progress Notes (Signed)
Reviewed thx DrG 

## 2018-06-20 DIAGNOSIS — F3341 Major depressive disorder, recurrent, in partial remission: Secondary | ICD-10-CM | POA: Insufficient documentation

## 2018-06-20 DIAGNOSIS — R739 Hyperglycemia, unspecified: Secondary | ICD-10-CM | POA: Insufficient documentation

## 2018-06-26 ENCOUNTER — Ambulatory Visit (INDEPENDENT_AMBULATORY_CARE_PROVIDER_SITE_OTHER): Payer: Medicare HMO | Admitting: Pharmacist

## 2018-06-26 DIAGNOSIS — Z7901 Long term (current) use of anticoagulants: Secondary | ICD-10-CM | POA: Diagnosis not present

## 2018-06-26 DIAGNOSIS — D6861 Antiphospholipid syndrome: Secondary | ICD-10-CM

## 2018-06-26 DIAGNOSIS — Z5181 Encounter for therapeutic drug level monitoring: Secondary | ICD-10-CM | POA: Diagnosis not present

## 2018-06-26 DIAGNOSIS — R760 Raised antibody titer: Secondary | ICD-10-CM

## 2018-06-26 LAB — POCT INR: INR: 3 (ref 2.0–3.0)

## 2018-06-26 NOTE — Progress Notes (Signed)
Anticoagulation Management [name] is a 72 y.o. female who reports to the clinic for monitoring of warfarin treatment.    Indication: Antiphospholipid antibody syndrome, Lupus anticoagulant positive, long term current use of anticoagulant.    Duration: indefinite Supervising physician: Murriel Hopper  Anticoagulation Clinic Visit History: Patient does not report signs/symptoms of bleeding or thromboembolism  Other recent changes: No diet, medications, lifestyle changes except as noted in patient findings.  Anticoagulation Episode Summary    Current INR goal:   2.0-3.0  TTR:   70.0 % (7 y)  Next INR check:   07/24/2018  INR from last check:   3.0 (06/26/2018)  Weekly max warfarin dose:     Target end date:     INR check location:     Preferred lab:     Send INR reminders to:      Indications   Antiphospholipid antibody syndrome (HCC) [D68.61] Lupus anticoagulant positive [R76.0]       Comments:         Anticoagulation Care Providers    Provider Role Specialty Phone number   Annia Belt, MD Referring Oncology [phone]      Allergies  Allergen Reactions  . Bee Venom Anaphylaxis  . Hornet Venom Anaphylaxis    Anaphylaxis Shock SHOCK  . Reglan [Metoclopramide] Shortness Of Breath and Other (See Comments)    Tongue got numb; didn't feel good  . Vitamin K Anaphylaxis and Rash    "kills me" iv  . Benadryl [Diphenhydramine Hcl] Rash  . Ciprofloxacin Rash  . Crestor [Rosuvastatin Calcium] Other (See Comments)    Muscle Aches  . Cymbalta [Duloxetine Hcl] Swelling    Swelling of tongue and thrush  . Daypro [Oxaprozin] Rash    Rash  . Doxycycline Other (See Comments)    Tongue burning, thrush  . Levofloxacin Rash  . Sulfonamide Derivatives Rash  . Zocor [Simvastatin] Other (See Comments)    Muscle Aches   Prior to Admission medications   Medication Sig Start Date End Date Taking? Authorizing Provider  ALPRAZolam [name]) 0.5 MG tablet Take 1 mg  by mouth See admin instructions. Every evening and sometimes during the day if needed for anxiety 06/07/12  Yes [provider]  Ascorbic Acid (VITAMIN C) 1000 MG tablet Take 1,000 mg by mouth daily.   Yes [provider]  aspirin 81 MG tablet Take 81 mg by mouth daily at 6 PM.    Yes [provider]  CALCIUM CARBONATE PO Take 1 tablet by mouth daily.   Yes [provider]  Cyanocobalamin (VITAMIN B 12 PO) Take 0.5-1 mg by mouth daily.   Yes [provider]  fluticasone (FLONASE) 50 MCG/ACT nasal spray Place 2 sprays into the nose daily as needed for allergies.  10/16/14  Yes [provider]  gabapentin (NEURONTIN) 300 MG capsule TAKE 1 TO 2 CAPSULES THREE TIMES DAILY Patient taking differently: Take 300 mg by mouth 2 (two) times daily. Pt is tapering off 10/21/17  Yes Marcial Pacas, MD  HYDROcodone-acetaminophen (NORCO/VICODIN) 5-325 MG per tablet Take 1 tablet by mouth 2 (two) times daily.    Yes [provider]  levothyroxine (SYNTHROID, LEVOTHROID) 112 MCG tablet Take 112 mcg by mouth daily before breakfast. Alternate with the 137mcg   Yes [provider]  levothyroxine (SYNTHROID, LEVOTHROID) 125 MCG tablet Take 125 mcg by mouth daily. Alternate with 178mcg 06/24/16  Yes [provider]  lidocaine (XYLOCAINE) 2 % solution Use as directed 15 mLs in  the mouth or throat every 4 (four) hours as needed for mouth pain.   Yes [provider]  magnesium oxide (MAG-OX) 400 MG tablet Take 400 mg by mouth daily.   Yes [provider]  metoprolol succinate (TOPROL-XL) 25 MG 24 hr tablet Take 12.5 mg by mouth 2 (two) times daily.    Yes [provider]  omeprazole (PRILOSEC) 20 MG capsule Take 20 mg by mouth daily.  02/06/13  Yes [provider]  pravastatin (PRAVACHOL) 20 MG tablet Take 20 mg by mouth daily.   Yes Christain Sacramento, MD  warfarin (COUMADIN) 5 MG tablet Take 5 mg daily except on  Mondays: take 2.5 mg (1/2 tablet) 05/11/18  Yes Annia Belt, MD   Past Medical History:  Diagnosis Date  . Anxiety    severe panic attacks  . Arthritis   . Aseptic meningitis   . Asthma    as child  . Breast cancer ([place])    right  . Chronic anticoagulation 08/04/2015  . Collapsed lung    hx of, left  . Complication of anesthesia    "hard to put asleep"  . Depression   . Fibromyalgia   . GERD (gastroesophageal reflux disease)   . H/O hiatal hernia   . Headache(784.0)   . Hepatitis 1990   "from eating at restaurant"  . History of antiphospholipid antibody syndrome   . 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  . Hx-TIA (transient ischemic attack)   . Hyperlipidemia   . Hypertension   . Hypothyroidism   . Neuromuscular disorder ([place])   . Pneumonia    hx of  . Soft tissue mass 06/14/2017   3 cm right post axilla same side as previous breast cancer 06/14/17  . Stroke ([place])   . 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 Marital status: Single    Spouse name: Not on file  . Number of children: 2  . Years of education: 43  . Highest education level: Not on file  Occupational History  . Not on file  Social Needs  . Financial resource strain: Not on file  . Food insecurity:    Worry: Not on file    Inability: Not on file  . Transportation needs:    Medical: Not on file    Non-medical: Not on file  Tobacco Use  . Smoking status: Never Smoker  . Smokeless tobacco: Never Used  Substance and Sexual Activity  . Alcohol use: No    Alcohol/week: 0.0 standard drinks  . Drug use: No  . Sexual activity: Not on file  Lifestyle  . Physical activity:    Days per week: Not on file    Minutes per session: Not on file  . Stress: Not on file  Relationships  . Social connections:    Talks on phone: Not on file    Gets  together: Not on file    Attends religious service: Not on file    Active member of club or organization: Not on file    Attends meetings of clubs or organizations: Not on file    Relationship status: Not on file  Other Topics Concern  . Not on file  Social History Narrative   Lives at home with her mother.  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  Right-handed.   2-4 cups caffeine daily.   Family History  Problem Relation Age of Onset  . Alzheimer's disease Mother   . Heart disease Mother   . Hyperlipidemia Mother   . Hypertension Mother   . Cancer Maternal Grandmother        colon  . Heart disease Sister   . Hypertension Sister   . Heart attack Sister   . Heart disease Brother   . Heart attack Brother   . Cancer Daughter   . Hypertension Daughter   . Hypertension Son     ASSESSMENT Recent Results: The most recent result is correlated with 40 mg per week: Lab Results  Component Value Date   INR 3.0 06/26/2018   INR 1.8 (A) 05/29/2018   INR 2.9 05/01/2018   PROTIME 30.0 (H) 03/13/2015    Anticoagulation Dosing: Description   DR JOINOMVEHMC'N PATIENT (route warfarin notes and list as authorizing provider for LOS & Follow-up, lab orders and warfarin prescriptions), do not list attending physician.  Take one (1) of your 5mg  peach-colored warfarin tablets by mouth, once-daily at 6PM--EXCEPT on Thursdays, take 1 and 1/2 of your 5mg  peach-colored warfarin tablets each Thursday.      INR today: Therapeutic  PLAN Weekly dose was decreased by 7% to 37.5 mg per week  Patient Instructions  Patient instructed to take medications as defined in the Anti-coagulation Track section of this encounter.  Patient instructed to take today's dose.  Patient instructed to take one (1) of your 5mg  peach-colored warfarin tablets by mouth, once-daily at 6PM--EXCEPT on Thursdays, take 1 and 1/2 of your 5mg  peach-colored warfarin tablets each Thursday.  Patient verbalized  understanding of these instructions.     Patient advised to contact clinic or seek medical attention if signs/symptoms of bleeding or thromboembolism occur.  Patient verbalized understanding by repeating back information and was advised to contact me if further medication-related questions arise. Patient was also provided an information handout.  Follow-up Return in 4 weeks (on 07/24/2018) for Follow up INR at 2:30PM.  Pennie Banter, PharmD, CPP  15 minutes spent face-to-face with the patient during the encounter. 50% of time spent on education. 50% of time was spent on fingerstick point of care INR sample collection, processing, results determination, dose adjustment and documentation in http://www.kim.net/.

## 2018-06-26 NOTE — Patient Instructions (Signed)
Patient instructed to take medications as defined in the Anti-coagulation Track section of this encounter.  Patient instructed to take today's dose.  Patient instructed to take one (1) of your 5mg  peach-colored warfarin tablets by mouth, once-daily at 6PM--EXCEPT on Thursdays, take 1 and 1/2 of your 5mg  peach-colored warfarin tablets each Thursday.  Patient verbalized understanding of these instructions.

## 2018-06-26 NOTE — Progress Notes (Signed)
Reviewed Thanks DrG 

## 2018-06-29 DIAGNOSIS — R197 Diarrhea, unspecified: Secondary | ICD-10-CM | POA: Insufficient documentation

## 2018-06-29 DIAGNOSIS — R112 Nausea with vomiting, unspecified: Secondary | ICD-10-CM | POA: Insufficient documentation

## 2018-07-06 ENCOUNTER — Other Ambulatory Visit: Payer: Self-pay | Admitting: Oncology

## 2018-07-06 DIAGNOSIS — D6861 Antiphospholipid syndrome: Secondary | ICD-10-CM

## 2018-07-06 DIAGNOSIS — Z7901 Long term (current) use of anticoagulants: Secondary | ICD-10-CM

## 2018-07-06 DIAGNOSIS — C50511 Malignant neoplasm of lower-outer quadrant of right female breast: Secondary | ICD-10-CM

## 2018-07-06 DIAGNOSIS — I742 Embolism and thrombosis of arteries of the upper extremities: Secondary | ICD-10-CM

## 2018-07-06 DIAGNOSIS — Z170 Estrogen receptor positive status [ER+]: Secondary | ICD-10-CM

## 2018-07-07 ENCOUNTER — Telehealth: Payer: Self-pay | Admitting: Family Medicine

## 2018-07-07 NOTE — Telephone Encounter (Signed)
Called pt - no answer; left message to call the office if needed.

## 2018-07-07 NOTE — Telephone Encounter (Signed)
Pls contact pt [phone], she have a question about her medicine that could affect her INR

## 2018-07-11 NOTE — Telephone Encounter (Signed)
Pt's here at the clinic - states she's going to a clinic for her neuropathy and has been given supplements to take. Cataplex B, Cataplex F; Clear Vite-ChC,Repair Vite  - stated to detox liver/gut; wants to be sure ok to take and not affect her INR.  Called Paulla Dolly - for his opinion. He looked them up ;stated he found no interaction.  Dr Gladstone Pih response relayed to pt. Stated she has an appt to see him on the 30th and will see if any affect from the supplements.

## 2018-07-11 NOTE — Telephone Encounter (Signed)
Pt called informed of Dr Synthia Innocent response "I personally recommend against non nutritional supplements except for multivitamins". Stated she was glad I had called her; and will wait on taking the supplements.

## 2018-07-11 NOTE — Telephone Encounter (Signed)
Reviewed & noted. I personally recommend against non nutritional supplements except for multivitamins

## 2018-07-12 ENCOUNTER — Other Ambulatory Visit: Payer: Self-pay | Admitting: Family Medicine

## 2018-07-12 DIAGNOSIS — E2839 Other primary ovarian failure: Secondary | ICD-10-CM

## 2018-07-17 NOTE — Telephone Encounter (Signed)
Thank you for the follow up. I will defer to Dr. Azucena Freed decision.

## 2018-07-24 ENCOUNTER — Ambulatory Visit (INDEPENDENT_AMBULATORY_CARE_PROVIDER_SITE_OTHER): Payer: Medicare HMO | Admitting: Pharmacist

## 2018-07-24 ENCOUNTER — Encounter: Payer: Self-pay | Admitting: Pharmacist

## 2018-07-24 DIAGNOSIS — D6861 Antiphospholipid syndrome: Secondary | ICD-10-CM

## 2018-07-24 DIAGNOSIS — Z5181 Encounter for therapeutic drug level monitoring: Secondary | ICD-10-CM

## 2018-07-24 DIAGNOSIS — Z7901 Long term (current) use of anticoagulants: Secondary | ICD-10-CM | POA: Diagnosis not present

## 2018-07-24 DIAGNOSIS — R760 Raised antibody titer: Secondary | ICD-10-CM

## 2018-07-24 LAB — POCT INR: INR: 1.9 — AB (ref 2.0–3.0)

## 2018-07-24 NOTE — Progress Notes (Signed)
Anticoagulation Management TOMORROW [name] is a 72 y.o. female who reports to the clinic for monitoring of warfarin treatment.    Indication: Antiphospholipid antibody syndrome (HCC) [D68.61], Lupus anticoagulant positive [R76.0], Current long term use of anticoagulant [Z79.01].    Duration: indefinite Supervising physician: Murriel Hopper  Anticoagulation Clinic Visit History: Patient does not report signs/symptoms of bleeding or thromboembolism  Other recent changes: No diet, medications, lifestyle changes endorsed.  Anticoagulation Episode Summary    Current INR goal:   2.0-3.0  TTR:   70.2 % (7.1 y)  Next INR check:   08/21/2018  INR from last check:   1.9! (07/24/2018)  Weekly max warfarin dose:     Target end date:     INR check location:     Preferred lab:     Send INR reminders to:      Indications   Antiphospholipid antibody syndrome (HCC) [D68.61] Lupus anticoagulant positive [R76.0]       Comments:         Anticoagulation Care Providers    Provider Role Specialty Phone number   Annia Belt, MD Referring Oncology [phone]      Allergies  Allergen Reactions  . Bee Venom Anaphylaxis  . Hornet Venom Anaphylaxis    Anaphylaxis Shock SHOCK  . Reglan [Metoclopramide] Shortness Of Breath and Other (See Comments)    Tongue got numb; didn't feel good  . Vitamin K Anaphylaxis and Rash    "kills me" iv  . Benadryl [Diphenhydramine Hcl] Rash  . Ciprofloxacin Rash  . Crestor [Rosuvastatin Calcium] Other (See Comments)    Muscle Aches  . Cymbalta [Duloxetine Hcl] Swelling    Swelling of tongue and thrush  . Daypro [Oxaprozin] Rash    Rash  . Doxycycline Other (See Comments)    Tongue burning, thrush  . Levofloxacin Rash  . Sulfonamide Derivatives Rash  . Zocor [Simvastatin] Other (See Comments)    Muscle Aches   Prior to Admission medications   Medication Sig Start Date End Date Taking? Authorizing Provider  ALPRAZolam [name]) 0.5 MG tablet  Take 1 mg by mouth See admin instructions. Every evening and sometimes during the day if needed for anxiety 06/07/12  Yes [provider]  Ascorbic Acid (VITAMIN C) 1000 MG tablet Take 1,000 mg by mouth daily.   Yes [provider]  aspirin 81 MG tablet Take 81 mg by mouth daily at 6 PM.    Yes [provider]  CALCIUM CARBONATE PO Take 1 tablet by mouth daily.   Yes [provider]  Cyanocobalamin (VITAMIN B 12 PO) Take 0.5-1 mg by mouth daily.   Yes [provider]  fluticasone (FLONASE) 50 MCG/ACT nasal spray Place 2 sprays into the nose daily as needed for allergies.  10/16/14  Yes [provider]  gabapentin (NEURONTIN) 300 MG capsule TAKE 1 TO 2 CAPSULES THREE TIMES DAILY Patient taking differently: Take 300 mg by mouth 2 (two) times daily. Pt is tapering off 10/21/17  Yes Marcial Pacas, MD  HYDROcodone-acetaminophen (NORCO/VICODIN) 5-325 MG per tablet Take 1 tablet by mouth 2 (two) times daily.    Yes [provider]  levothyroxine (SYNTHROID, LEVOTHROID) 112 MCG tablet Take 112 mcg by mouth daily before breakfast. Alternate with the 139mcg   Yes [provider]  levothyroxine (SYNTHROID, LEVOTHROID) 125 MCG tablet Take 125 mcg by mouth daily. Alternate with 153mcg 06/24/16  Yes [provider]  lidocaine (XYLOCAINE) 2 % solution Use as directed 15 mLs in the  mouth or throat every 4 (four) hours as needed for mouth pain.   Yes [provider]  magnesium oxide (MAG-OX) 400 MG tablet Take 400 mg by mouth daily.   Yes [provider]  metoprolol succinate (TOPROL-XL) 25 MG 24 hr tablet Take 12.5 mg by mouth 2 (two) times daily.    Yes [provider]  omeprazole (PRILOSEC) 20 MG capsule Take 20 mg by mouth daily.  02/06/13  Yes [provider]  pravastatin (PRAVACHOL) 20 MG tablet Take 20 mg by mouth daily.   Yes Christain Sacramento, MD  warfarin (COUMADIN) 5 MG tablet Take 5 mg daily except  on Mondays: take 2.5 mg (1/2 tablet) 05/11/18  Yes Annia Belt, MD   Past Medical History:  Diagnosis Date  . Anxiety    severe panic attacks  . Arthritis   . Aseptic meningitis   . Asthma    as child  . Breast cancer ([place])    right  . Chronic anticoagulation 08/04/2015  . Collapsed lung    hx of, left  . Complication of anesthesia    "hard to put asleep"  . Depression   . Fibromyalgia   . GERD (gastroesophageal reflux disease)   . H/O hiatal hernia   . Headache(784.0)   . Hepatitis 1990   "from eating at restaurant"  . History of antiphospholipid antibody syndrome   . 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  . Hx-TIA (transient ischemic attack)   . Hyperlipidemia   . Hypertension   . Hypothyroidism   . Neuromuscular disorder ([place])   . Pneumonia    hx of  . Soft tissue mass 06/14/2017   3 cm right post axilla same side as previous breast cancer 06/14/17  . Stroke ([place])   . 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 Marital status: Single    Spouse name: Not on file  . Number of children: 2  . Years of education: 41  . Highest education level: Not on file  Occupational History  . Not on file  Social Needs  . Financial resource strain: Not on file  . Food insecurity:    Worry: Not on file    Inability: Not on file  . Transportation needs:    Medical: Not on file    Non-medical: Not on file  Tobacco Use  . Smoking status: Never Smoker  . Smokeless tobacco: Never Used  Substance and Sexual Activity  . Alcohol use: No    Alcohol/week: 0.0 standard drinks  . Drug use: No  . Sexual activity: Not on file  Lifestyle  . Physical activity:    Days per week: Not on file    Minutes per session: Not on file  . Stress: Not on file  Relationships  . Social connections:    Talks on phone: Not on file    Gets  together: Not on file    Attends religious service: Not on file    Active member of club or organization: Not on file    Attends meetings of clubs or organizations: Not on file    Relationship status: Not on file  Other Topics Concern  . Not on file  Social History Narrative   Lives at home with her mother.  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  Right-handed.   2-4 cups caffeine daily.   Family History  Problem Relation Age of Onset  . Alzheimer's disease Mother   . Heart disease Mother   . Hyperlipidemia Mother   . Hypertension Mother   . Cancer Maternal Grandmother        colon  . Heart disease Sister   . Hypertension Sister   . Heart attack Sister   . Heart disease Brother   . Heart attack Brother   . Cancer Daughter   . Hypertension Daughter   . Hypertension Son     ASSESSMENT Recent Results: The most recent result is correlated with 37.5 mg per week: Lab Results  Component Value Date   INR 1.9 (A) 07/24/2018   INR 3.0 06/26/2018   INR 1.8 (A) 05/29/2018   PROTIME 30.0 (H) 03/13/2015    Anticoagulation Dosing: Description   DR WCHENIDPOEU'M PATIENT (route warfarin notes and list as authorizing provider for LOS & Follow-up, lab orders and warfarin prescriptions), do not list attending physician.  Take one (1) of your 5mg  peach-colored warfarin tablets by mouth, once-daily at 6PM--EXCEPT on Mondays and Thursdays, take 1 and 1/2 of your 5mg  peach-colored warfarin tablets each Monday and Thursday.      INR today: Subtherapeutic  PLAN Weekly dose was increased by 7% to 40 mg per week  Patient Instructions  Patient instructed to take medications as defined in the Anti-coagulation Track section of this encounter.  Patient instructed to take today's dose.  Patient instructed to take one (1) of your 5mg  peach-colored warfarin tablets by mouth, once-daily at 6PM--EXCEPT on Mondays and Thursdays, take 1 and 1/2 of your 5mg  peach-colored warfarin  tablets each Monday and Thursday.  Patient verbalized understanding of these instructions.     Patient advised to contact clinic or seek medical attention if signs/symptoms of bleeding or thromboembolism occur.  Patient verbalized understanding by repeating back information and was advised to contact me if further medication-related questions arise. Patient was also provided an information handout.  Follow-up Return in 4 weeks (on 08/21/2018) for Follow up INR at 2:30PM.  [name], PharmD, CPP  15 minutes spent face-to-face with the patient during the encounter. 50% of time spent on education. 50% of time was spent on fingerstick point of care INR sample collection, processing, results determination, dose adjustment and documentation in http://www.kim.net/.

## 2018-07-24 NOTE — Patient Instructions (Signed)
Patient instructed to take medications as defined in the Anti-coagulation Track section of this encounter.  Patient instructed to take today's dose.  Patient instructed to take one (1) of your 5mg  peach-colored warfarin tablets by mouth, once-daily at 6PM--EXCEPT on Mondays and Thursdays, take 1 and 1/2 of your 5mg  peach-colored warfarin tablets each Monday and Thursday.  Patient verbalized understanding of these instructions.

## 2018-07-24 NOTE — Progress Notes (Signed)
Reviewed Thanks DrG 

## 2018-08-04 ENCOUNTER — Ambulatory Visit
Admission: RE | Admit: 2018-08-04 | Discharge: 2018-08-04 | Disposition: A | Discharge status: Home / Self Care | Payer: Medicare HMO | Source: Ambulatory Visit | Attending: Family Medicine | Admitting: Family Medicine

## 2018-08-04 ENCOUNTER — Other Ambulatory Visit: Payer: Self-pay | Admitting: Family Medicine

## 2018-08-04 DIAGNOSIS — Z1231 Encounter for screening mammogram for malignant neoplasm of breast: Secondary | ICD-10-CM

## 2018-08-04 HISTORY — DX: Personal history of irradiation: Z92.3

## 2018-08-10 ENCOUNTER — Telehealth: Payer: Self-pay | Admitting: *Deleted

## 2018-08-10 NOTE — Telephone Encounter (Signed)
This is OK; start back on coumadin on night of procedure.

## 2018-08-10 NOTE — Telephone Encounter (Signed)
Called pt - informed "This is OK; start back on coumadin on night of procedure." per Dr Beryle Beams. Voiced understanding.

## 2018-08-10 NOTE — Telephone Encounter (Signed)
Returned pt's call - stated she's going to have 5 teeth pulled on Saturday @ 1100 AM. She's on Coumadin; she did not it yesterday afternoon b/c she knew to stop 3 days prior. But she wants to be sure this was ok with Dr Beryle Beams.

## 2018-08-21 ENCOUNTER — Ambulatory Visit (INDEPENDENT_AMBULATORY_CARE_PROVIDER_SITE_OTHER): Payer: Medicare HMO | Admitting: Pharmacist

## 2018-08-21 ENCOUNTER — Encounter: Payer: Self-pay | Admitting: Pharmacist

## 2018-08-21 DIAGNOSIS — D6861 Antiphospholipid syndrome: Secondary | ICD-10-CM | POA: Diagnosis not present

## 2018-08-21 DIAGNOSIS — R760 Raised antibody titer: Secondary | ICD-10-CM

## 2018-08-21 DIAGNOSIS — Z5181 Encounter for therapeutic drug level monitoring: Secondary | ICD-10-CM | POA: Diagnosis not present

## 2018-08-21 DIAGNOSIS — Z7901 Long term (current) use of anticoagulants: Secondary | ICD-10-CM | POA: Diagnosis not present

## 2018-08-21 LAB — POCT INR: INR: 2.3 (ref 2.0–3.0)

## 2018-08-21 NOTE — Progress Notes (Signed)
Reviewed Thanks DrG 

## 2018-08-21 NOTE — Patient Instructions (Signed)
Patient instructed to take medications as defined in the Anti-coagulation Track section of this encounter.  Patient instructed to take today's dose.  Patient instructed to take one (1) of your 5mg  peach-colored warfarin tablets by mouth, once-daily at 6PM--EXCEPT on Mondays and Thursdays, take 1 and 1/2 of your 5mg  peach-colored warfarin tablets each Monday and Thursday. Patient verbalized understanding of these instructions.

## 2018-08-21 NOTE — Progress Notes (Signed)
Anticoagulation Management [name] is a 73 y.o. female who reports to the clinic for monitoring of warfarin treatment.    Indication: Antiphospholipid antibody syndrome, Lupus anticoagulant positive, long term current use of anticoagulant.    Duration: indefinite Supervising physician: Murriel Hopper  Anticoagulation Clinic Visit History: Patient does not report signs/symptoms of bleeding or thromboembolism  Other recent changes: No diet, medications, lifestyle changes.  Anticoagulation Episode Summary    Current INR goal:   2.0-3.0  TTR:   70.3 % (7.2 y)  Next INR check:   09/18/2018  INR from last check:   2.3 (08/21/2018)  Weekly max warfarin dose:     Target end date:     INR check location:     Preferred lab:     Send INR reminders to:      Indications   Antiphospholipid antibody syndrome (HCC) [D68.61] Lupus anticoagulant positive [R76.0]       Comments:         Anticoagulation Care Providers    Provider Role Specialty Phone number   Annia Belt, MD Referring Oncology [phone]      Allergies  Allergen Reactions  . Bee Venom Anaphylaxis  . Hornet Venom Anaphylaxis    Anaphylaxis Shock SHOCK  . Reglan [Metoclopramide] Shortness Of Breath and Other (See Comments)    Tongue got numb; didn't feel good  . Vitamin K Anaphylaxis and Rash    "kills me" iv  . Benadryl [Diphenhydramine Hcl] Rash  . Ciprofloxacin Rash  . Crestor [Rosuvastatin Calcium] Other (See Comments)    Muscle Aches  . Cymbalta [Duloxetine Hcl] Swelling    Swelling of tongue and thrush  . Daypro [Oxaprozin] Rash    Rash  . Doxycycline Other (See Comments)    Tongue burning, thrush  . Levofloxacin Rash  . Sulfonamide Derivatives Rash  . Zocor [Simvastatin] Other (See Comments)    Muscle Aches   Prior to Admission medications   Medication Sig Start Date End Date Taking? Authorizing Provider  ALPRAZolam [name]) 0.5 MG tablet Take 1 mg by mouth See admin instructions.  Every evening and sometimes during the day if needed for anxiety 06/07/12  Yes [provider]  Ascorbic Acid (VITAMIN C) 1000 MG tablet Take 1,000 mg by mouth daily.   Yes [provider]  aspirin 81 MG tablet Take 81 mg by mouth daily at 6 PM.    Yes [provider]  CALCIUM CARBONATE PO Take 1 tablet by mouth daily.   Yes [provider]  Cyanocobalamin (VITAMIN B 12 PO) Take 0.5-1 mg by mouth daily.   Yes [provider]  fluticasone (FLONASE) 50 MCG/ACT nasal spray Place 2 sprays into the nose daily as needed for allergies.  10/16/14  Yes [provider]  gabapentin (NEURONTIN) 300 MG capsule TAKE 1 TO 2 CAPSULES THREE TIMES DAILY Patient taking differently: Take 300 mg by mouth 2 (two) times daily. Pt is tapering off 10/21/17  Yes Marcial Pacas, MD  HYDROcodone-acetaminophen (NORCO/VICODIN) 5-325 MG per tablet Take 1 tablet by mouth 2 (two) times daily.    Yes [provider]  levothyroxine (SYNTHROID, LEVOTHROID) 112 MCG tablet Take 112 mcg by mouth daily before breakfast. Alternate with the 132mcg   Yes [provider]  levothyroxine (SYNTHROID, LEVOTHROID) 125 MCG tablet Take 125 mcg by mouth daily. Alternate with 171mcg 06/24/16  Yes [provider]  lidocaine (XYLOCAINE) 2 % solution Use as directed 15 mLs in the mouth or throat every 4 (  four) hours as needed for mouth pain.   Yes [provider]  magnesium oxide (MAG-OX) 400 MG tablet Take 400 mg by mouth daily.   Yes [provider]  metoprolol succinate (TOPROL-XL) 25 MG 24 hr tablet Take 12.5 mg by mouth 2 (two) times daily.    Yes [provider]  omeprazole (PRILOSEC) 20 MG capsule Take 20 mg by mouth daily.  02/06/13  Yes [provider]  pravastatin (PRAVACHOL) 20 MG tablet Take 20 mg by mouth daily.   Yes Christain Sacramento, MD  warfarin (COUMADIN) 5 MG tablet Take 5 mg daily except on Mondays: take 2.5 mg (1/2 tablet)  05/11/18  Yes Annia Belt, MD   Past Medical History:  Diagnosis Date  . Anxiety    severe panic attacks  . Arthritis   . Aseptic meningitis   . Asthma    as child  . Breast cancer ([place])    right  . Chronic anticoagulation 08/04/2015  . Collapsed lung    hx of, left  . Complication of anesthesia    "hard to put asleep"  . Depression   . Fibromyalgia   . GERD (gastroesophageal reflux disease)   . H/O hiatal hernia   . Headache(784.0)   . Hepatitis 1990   "from eating at restaurant"  . History of antiphospholipid antibody syndrome   . 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  . Hx-TIA (transient ischemic attack)   . Hyperlipidemia   . Hypertension   . Hypothyroidism   . Neuromuscular disorder ([place])   . Personal history of radiation therapy   . Pneumonia    hx of  . Soft tissue mass 06/14/2017   3 cm right post axilla same side as previous breast cancer 06/14/17  . Stroke ([place])   . 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 Marital status: Single    Spouse name: Not on file  . Number of children: 2  . Years of education: 57  . Highest education level: Not on file  Occupational History  . Not on file  Social Needs  . Financial resource strain: Not on file  . Food insecurity:    Worry: Not on file    Inability: Not on file  . Transportation needs:    Medical: Not on file    Non-medical: Not on file  Tobacco Use  . Smoking status: Never Smoker  . Smokeless tobacco: Never Used  Substance and Sexual Activity  . Alcohol use: No    Alcohol/week: 0.0 standard drinks  . Drug use: No  . Sexual activity: Not on file  Lifestyle  . Physical activity:    Days per week: Not on file    Minutes per session: Not on file  . Stress: Not on file  Relationships  . Social connections:    Talks on phone: Not on file     Gets together: Not on file    Attends religious service: Not on file    Active member of club or organization: Not on file    Attends meetings of clubs or organizations: Not on file    Relationship status: Not on file  Other Topics Concern  . Not on file  Social History Narrative   Lives at home with her mother.  She is her mother's primary caregiver due  to dementia.   Right-handed.   2-4 cups caffeine daily.   Family History  Problem Relation Age of Onset  . Alzheimer's disease Mother   . Heart disease Mother   . Hyperlipidemia Mother   . Hypertension Mother   . Cancer Maternal Grandmother        colon  . Heart disease Sister   . Hypertension Sister   . Heart attack Sister   . Heart disease Brother   . Heart attack Brother   . Cancer Daughter   . Hypertension Daughter   . Hypertension Son     ASSESSMENT Recent Results: The most recent result is correlated with 40 mg per week: Lab Results  Component Value Date   INR 2.3 08/21/2018   INR 1.9 (A) 07/24/2018   INR 3.0 06/26/2018   PROTIME 30.0 (H) 03/13/2015    Anticoagulation Dosing: Description   DR [id number]'O PATIENT (route warfarin notes and list as authorizing provider for LOS & Follow-up, lab orders and warfarin prescriptions), do not list attending physician.  Take one (1) of your 5mg  peach-colored warfarin tablets by mouth, once-daily at 6PM--EXCEPT on Mondays and Thursdays, take 1 and 1/2 of your 5mg  peach-colored warfarin tablets each Monday and Thursday.      INR today: Therapeutic  PLAN Weekly dose was unchanged , will remain on 40mg  warfarin per week.   Patient Instructions  Patient instructed to take medications as defined in the Anti-coagulation Track section of this encounter.  Patient instructed to take today's dose.  Patient instructed to take one (1) of your 5mg  peach-colored warfarin tablets by mouth, once-daily at 6PM--EXCEPT on Mondays and Thursdays, take 1 and 1/2 of your 5mg   peach-colored warfarin tablets each Monday and Thursday. Patient verbalized understanding of these instructions.     Patient advised to contact clinic or seek medical attention if signs/symptoms of bleeding or thromboembolism occur.  Patient verbalized understanding by repeating back information and was advised to contact me if further medication-related questions arise. Patient was also provided an information handout.  Follow-up Return in 4 weeks (on 09/18/2018) for Follow up INR at 2:15PM.  Pennie Banter, PharmD, CPP  15 minutes spent face-to-face with the patient during the encounter. 50% of time spent on education. 50% of time was spent on fingerstick point of care INR sample collection, processing, results determination and documentation in [email or web address]

## 2018-09-04 ENCOUNTER — Ambulatory Visit
Admission: RE | Admit: 2018-09-04 | Discharge: 2018-09-04 | Disposition: A | Discharge status: Home / Self Care | Payer: Medicare HMO | Source: Ambulatory Visit | Attending: Family Medicine | Admitting: Family Medicine

## 2018-09-04 DIAGNOSIS — E2839 Other primary ovarian failure: Secondary | ICD-10-CM

## 2018-09-18 ENCOUNTER — Ambulatory Visit (INDEPENDENT_AMBULATORY_CARE_PROVIDER_SITE_OTHER): Payer: Medicare HMO | Admitting: Pharmacist

## 2018-09-18 ENCOUNTER — Encounter: Payer: Self-pay | Admitting: *Deleted

## 2018-09-18 DIAGNOSIS — D6861 Antiphospholipid syndrome: Secondary | ICD-10-CM

## 2018-09-18 DIAGNOSIS — R760 Raised antibody titer: Secondary | ICD-10-CM

## 2018-09-18 DIAGNOSIS — Z7901 Long term (current) use of anticoagulants: Secondary | ICD-10-CM | POA: Diagnosis not present

## 2018-09-18 DIAGNOSIS — Z5181 Encounter for therapeutic drug level monitoring: Secondary | ICD-10-CM

## 2018-09-18 LAB — POCT INR: INR: 2.8 (ref 2.0–3.0)

## 2018-09-18 NOTE — Patient Instructions (Signed)
Patient educated about medication as defined in this encounter and verbalized understanding by repeating back instructions provided.   

## 2018-09-18 NOTE — Progress Notes (Addendum)
Reviewed Patient just resuming warfarin after breast cancer surgery. INR subtherapeutic. Need to continue lovenox bridge.  DrG  Addendum: I put this note on the wrong chart. Disregard above instructions.  DrG

## 2018-09-18 NOTE — Progress Notes (Signed)
Anticoagulation Management [name] is a 73 y.o. female who reports to the clinic for monitoring of warfarin treatment.    Indication: Antiphospholipid antibody syndrome, Lupus anticoagulant positive, long term current use of anticoagulant.    Duration: indefinite Supervising physician: Murriel Hopper  Anticoagulation Clinic Visit History: Patient does not report signs/symptoms of bleeding or thromboembolism  Other recent changes: No diet, medications, lifestyle changes.   Anticoagulation Episode Summary    Current INR goal:   2.0-3.0  TTR:   70.6 % (7.2 y)  Next INR check:   10/16/2018  INR from last check:   2.8 (09/18/2018)  Weekly max warfarin dose:     Target end date:     INR check location:     Preferred lab:     Send INR reminders to:      Indications   Antiphospholipid antibody syndrome (HCC) [D68.61] Lupus anticoagulant positive [R76.0]       Comments:         Anticoagulation Care Providers    Provider Role Specialty Phone number   Annia Belt, MD Referring Oncology [phone]     Allergies  Allergen Reactions  . Bee Venom Anaphylaxis  . Hornet Venom Anaphylaxis    Anaphylaxis Shock SHOCK  . Reglan [Metoclopramide] Shortness Of Breath and Other (See Comments)    Tongue got numb; didn't feel good  . Vitamin K Anaphylaxis and Rash    "kills me" iv  . Benadryl [Diphenhydramine Hcl] Rash  . Ciprofloxacin Rash  . Crestor [Rosuvastatin Calcium] Other (See Comments)    Muscle Aches  . Cymbalta [Duloxetine Hcl] Swelling    Swelling of tongue and thrush  . Daypro [Oxaprozin] Rash    Rash  . Doxycycline Other (See Comments)    Tongue burning, thrush  . Levofloxacin Rash  . Sulfonamide Derivatives Rash  . Zocor [Simvastatin] Other (See Comments)    Muscle Aches   Medication Sig  ALPRAZolam (XANAX) 0.5 MG tablet Take 1 mg by mouth See admin instructions. Every evening and sometimes during the day if needed for anxiety  Ascorbic Acid  (VITAMIN C) 1000 MG tablet Take 1,000 mg by mouth daily.  aspirin 81 MG tablet Take 81 mg by mouth daily at 6 PM.   CALCIUM CARBONATE PO Take 1 tablet by mouth daily.  Cyanocobalamin (VITAMIN B 12 PO) Take 0.5-1 mg by mouth daily.  fluticasone (FLONASE) 50 MCG/ACT nasal spray Place 2 sprays into the nose daily as needed for allergies.   gabapentin (NEURONTIN) 300 MG capsule TAKE 1 TO 2 CAPSULES THREE TIMES DAILY Patient taking differently: Take 300 mg by mouth 2 (two) times daily. Pt is tapering off  HYDROcodone-acetaminophen (NORCO/VICODIN) 5-325 MG per tablet Take 1 tablet by mouth 2 (two) times daily.   levothyroxine (SYNTHROID, LEVOTHROID) 112 MCG tablet Take 112 mcg by mouth daily before breakfast. Alternate with the 164mcg  levothyroxine (SYNTHROID, LEVOTHROID) 125 MCG tablet Take 125 mcg by mouth daily. Alternate with 142mcg  lidocaine (XYLOCAINE) 2 % solution Use as directed 15 mLs in the mouth or throat every 4 (four) hours as needed for mouth pain.  magnesium oxide (MAG-OX) 400 MG tablet Take 400 mg by mouth daily.  metoprolol succinate (TOPROL-XL) 25 MG 24 hr tablet Take 12.5 mg by mouth 2 (two) times daily.   omeprazole (PRILOSEC) 20 MG capsule Take 20 mg by mouth daily.   pravastatin (PRAVACHOL) 20 MG tablet Take 20 mg by mouth daily.  warfarin (COUMADIN) 5 MG tablet Take 5 mg  daily except on Mondays: take 2.5 mg (1/2 tablet)   Past Medical History:  Diagnosis Date  . Anxiety    severe panic attacks  . Arthritis   . Aseptic meningitis   . Asthma    as child  . Breast cancer ([place])    right  . Chronic anticoagulation 08/04/2015  . Collapsed lung    hx of, left  . Complication of anesthesia    "hard to put asleep"  . Depression   . Fibromyalgia   . GERD (gastroesophageal reflux disease)   . H/O hiatal hernia   . Headache(784.0)   . Hepatitis 1990   "from eating at restaurant"  . History of antiphospholipid antibody syndrome   . History of DVT (deep vein thrombosis)     in all fingers  . Hx-TIA (transient ischemic attack)   . Hyperlipidemia   . Hypertension   . Hypothyroidism   . Neuromuscular disorder ([place])   . Personal history of radiation therapy   . Pneumonia    hx of  . Soft tissue mass 06/14/2017   3 cm right post axilla same side as previous breast cancer 06/14/17  . Stroke ([place])   . 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 Marital status: Single    Spouse name: Not on file  . Number of children: 2  . Years of education: 71  . Highest education level: Not on file  Occupational History  . Not on file  Social Needs  . Financial resource strain: Not on file  . Food insecurity:    Worry: Not on file    Inability: Not on file  . Transportation needs:    Medical: Not on file    Non-medical: Not on file  Tobacco Use  . Smoking status: Never Smoker  . Smokeless tobacco: Never Used  Substance and Sexual Activity  . Alcohol use: No    Alcohol/week: 0.0 standard drinks  . Drug use: No  . Sexual activity: Not on file  Lifestyle  . Physical activity:    Days per week: Not on file    Minutes per session: Not on file  . Stress: Not on file  Relationships  . Social connections:    Talks on phone: Not on file    Gets together: Not on file    Attends religious service: Not on file    Active member of club or organization: Not on file    Attends meetings of clubs or organizations: Not on file    Relationship status: Not on file  Other Topics Concern  . Not on file  Social History Narrative   Lives at home with her mother.  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   Right-handed.   2-4 cups caffeine daily.   Family History  Problem Relation Age of Onset  . Alzheimer's disease Mother   . Heart disease Mother   . Hyperlipidemia Mother   . Hypertension Mother   . Cancer Maternal  Grandmother        colon  . Heart disease Sister   . Hypertension Sister   . Heart attack Sister   . Heart disease Brother   . Heart attack Brother   . Cancer Daughter   . Hypertension Daughter   . Hypertension Son    ASSESSMENT Lab Results  Component Value Date  INR 2.8 09/18/2018   INR 2.3 08/21/2018   INR 1.9 (A) 07/24/2018   PROTIME 30.0 (H) 03/13/2015   Anticoagulation Dosing: Description   DR [id number]'S PATIENT (route warfarin notes and list as authorizing provider for LOS & Follow-up, lab orders and warfarin prescriptions), do not list attending physician.  Take one (1) of your 5mg  peach-colored warfarin tablets by mouth, once-daily at 6PM--EXCEPT on Mondays and Thursdays, take 1 and 1/2 of your 5mg  peach-colored warfarin tablets each Monday and Thursday.      INR today: Therapeutic  PLAN Weekly dose was unchanged  Patient Instructions  Patient educated about medication as defined in this encounter and verbalized understanding by repeating back instructions provided.    Patient advised to contact clinic or seek medical attention if signs/symptoms of bleeding or thromboembolism occur.  Patient verbalized understanding by repeating back information and was advised to contact me if further medication-related questions arise. Patient was also provided an information handout.  Follow-up Return in about 4 weeks (around 10/16/2018).  [name]  15 minutes spent face-to-face with the patient during the encounter. 50% of time spent on education, including signs/sx bleeding and clotting, as well as food and drug interactions with warfarin. 50% of time was spent on fingerprick POC INR sample collection,processing, results determination, and documentation in [email or web address]

## 2018-09-19 ENCOUNTER — Ambulatory Visit (INDEPENDENT_AMBULATORY_CARE_PROVIDER_SITE_OTHER): Payer: Medicare HMO | Admitting: Oncology

## 2018-09-19 ENCOUNTER — Encounter: Payer: Self-pay | Admitting: Oncology

## 2018-09-19 ENCOUNTER — Other Ambulatory Visit: Payer: Self-pay

## 2018-09-19 VITALS — BP 140/67 | HR 62 | Temp 97.8°F | Ht 65.0 in | Wt 176.0 lb

## 2018-09-19 DIAGNOSIS — F419 Anxiety disorder, unspecified: Secondary | ICD-10-CM

## 2018-09-19 DIAGNOSIS — R760 Raised antibody titer: Secondary | ICD-10-CM

## 2018-09-19 DIAGNOSIS — Z882 Allergy status to sulfonamides status: Secondary | ICD-10-CM

## 2018-09-19 DIAGNOSIS — I6521 Occlusion and stenosis of right carotid artery: Secondary | ICD-10-CM | POA: Diagnosis not present

## 2018-09-19 DIAGNOSIS — D6861 Antiphospholipid syndrome: Secondary | ICD-10-CM | POA: Diagnosis not present

## 2018-09-19 DIAGNOSIS — F339 Major depressive disorder, recurrent, unspecified: Secondary | ICD-10-CM

## 2018-09-19 DIAGNOSIS — Z8669 Personal history of other diseases of the nervous system and sense organs: Secondary | ICD-10-CM

## 2018-09-19 DIAGNOSIS — Z91038 Other insect allergy status: Secondary | ICD-10-CM

## 2018-09-19 DIAGNOSIS — E890 Postprocedural hypothyroidism: Secondary | ICD-10-CM

## 2018-09-19 DIAGNOSIS — Z8673 Personal history of transient ischemic attack (TIA), and cerebral infarction without residual deficits: Secondary | ICD-10-CM | POA: Diagnosis not present

## 2018-09-19 DIAGNOSIS — Z8719 Personal history of other diseases of the digestive system: Secondary | ICD-10-CM

## 2018-09-19 DIAGNOSIS — Z7901 Long term (current) use of anticoagulants: Secondary | ICD-10-CM

## 2018-09-19 DIAGNOSIS — K209 Esophagitis, unspecified: Secondary | ICD-10-CM

## 2018-09-19 DIAGNOSIS — C50511 Malignant neoplasm of lower-outer quadrant of right female breast: Secondary | ICD-10-CM | POA: Diagnosis not present

## 2018-09-19 DIAGNOSIS — Z170 Estrogen receptor positive status [ER+]: Secondary | ICD-10-CM | POA: Diagnosis not present

## 2018-09-19 DIAGNOSIS — Z9221 Personal history of antineoplastic chemotherapy: Secondary | ICD-10-CM

## 2018-09-19 DIAGNOSIS — Z91030 Bee allergy status: Secondary | ICD-10-CM

## 2018-09-19 DIAGNOSIS — Z888 Allergy status to other drugs, medicaments and biological substances status: Secondary | ICD-10-CM

## 2018-09-19 DIAGNOSIS — Z86010 Personal history of colonic polyps: Secondary | ICD-10-CM

## 2018-09-19 DIAGNOSIS — Z881 Allergy status to other antibiotic agents status: Secondary | ICD-10-CM

## 2018-09-19 DIAGNOSIS — Z853 Personal history of malignant neoplasm of breast: Secondary | ICD-10-CM

## 2018-09-19 DIAGNOSIS — Z86718 Personal history of other venous thrombosis and embolism: Secondary | ICD-10-CM

## 2018-09-19 NOTE — Patient Instructions (Signed)
To lab today  A referral was made to Dr Irene Limbo at the [facility] for your ongoing care. Please call [phone] for an appointment if you don't hear from them in the next 2-3 weeks   It has been a pleasure working with you!

## 2018-09-20 LAB — CBC WITH DIFFERENTIAL/PLATELET
Basophils Absolute: 0.1 x10E3/uL (ref 0.0–0.2)
Basos: 1 %
EOS (ABSOLUTE): 0.2 x10E3/uL (ref 0.0–0.4)
Eos: 3 %
Hematocrit: 41.3 % (ref 34.0–46.6)
Hemoglobin: 13.3 g/dL (ref 11.1–15.9)
Immature Grans (Abs): 0 x10E3/uL (ref 0.0–0.1)
Immature Granulocytes: 0 %
Lymphocytes Absolute: 2.3 x10E3/uL (ref 0.7–3.1)
Lymphs: 32 %
MCH: 30 pg (ref 26.6–33.0)
MCHC: 32.2 g/dL (ref 31.5–35.7)
MCV: 93 fL (ref 79–97)
Monocytes Absolute: 0.6 x10E3/uL (ref 0.1–0.9)
Monocytes: 8 %
Neutrophils Absolute: 4.1 x10E3/uL (ref 1.4–7.0)
Neutrophils: 56 %
Platelets: 258 x10E3/uL (ref 150–450)
RBC: 4.44 x10E6/uL (ref 3.77–5.28)
RDW: 13.4 % (ref 11.7–15.4)
WBC: 7.3 x10E3/uL (ref 3.4–10.8)

## 2018-09-20 LAB — COMPREHENSIVE METABOLIC PANEL WITH GFR
ALT: 21 IU/L (ref 0–32)
AST: 24 IU/L (ref 0–40)
Albumin/Globulin Ratio: 1.7 (ref 1.2–2.2)
Albumin: 4.6 g/dL (ref 3.7–4.7)
Alkaline Phosphatase: 95 IU/L (ref 39–117)
BUN/Creatinine Ratio: 18 (ref 12–28)
BUN: 24 mg/dL (ref 8–27)
Bilirubin Total: 0.3 mg/dL (ref 0.0–1.2)
CO2: 23 mmol/L (ref 20–29)
Calcium: 9.5 mg/dL (ref 8.7–10.3)
Chloride: 101 mmol/L (ref 96–106)
Creatinine, Ser: 1.3 mg/dL — ABNORMAL HIGH (ref 0.57–1.00)
GFR calc Af Amer: 48 mL/min/1.73 — ABNORMAL LOW
GFR calc non Af Amer: 41 mL/min/1.73 — ABNORMAL LOW
Globulin, Total: 2.7 g/dL (ref 1.5–4.5)
Glucose: 109 mg/dL — ABNORMAL HIGH (ref 65–99)
Potassium: 4.5 mmol/L (ref 3.5–5.2)
Sodium: 140 mmol/L (ref 134–144)
Total Protein: 7.3 g/dL (ref 6.0–8.5)

## 2018-09-20 NOTE — Progress Notes (Signed)
Hematology and Oncology Follow Up Visit  [name] [id number] [birth year] 73 y.o. 09/20/2018 2:46 PM   Principle Diagnosis: Encounter Diagnoses  Name Primary?  [name] Antiphospholipid antibody syndrome ([place]) Yes  . Lupus anticoagulant positive   . Malignant neoplasm of lower-outer quadrant of right breast of female, estrogen receptor positive ([place])   . History of DVT (deep vein thrombosis)   . Hx-TIA (transient ischemic attack)   . Chronic anticoagulation   Clinical summary: 73 year-old woman who has a history of an arterial embolus to one of the fingers of her left hand in October 1998 as the first sign of antiphospholipid antibody syndrome. She has been on chronic Coumadin anticoagulation with no subsequent thrombotic events.  She developed a stage I, ER-positive cancer of the right breast in December 1999, treated with lumpectomy, radiation, and then hormonal therapy with tamoxifen. She developed a large hematoma at the lumpectomy site which then organized into dense tissue which had to be excised. She still has a dense scar in the right breast.   She had an upper endoscopy and colonoscopy by Dr. Juanita Craver on 03/09/2011. There was some question of Barrett esophagus in the past, reported by another gastroenterologist. However, Dr. Collene Mares did not find any evidence of this on her study . There were changes from prior Nissen fundoplication. There was a questionable small patch of Barrett esophagus above the Z-line which was biopsied. Biopsies returned showing focal mild active esophagitis but no evidence for Barrett esophagus. She was started on an antireflux regimen. Unfortunately, the previous Nissen fundoplication broke down and she required a repair procedure on 12/12/2012 by Dr. Johnathan Hausen.  She was hospitalized for an acute neurologic change. She was admitted on 04/13/2016 and I saw her when she was in the hospital. She presented with sudden onset of left-sided numbness and blurred  vision in her right eye. She described her visual changes triple vision. She had transient dysarthria. 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 She had no fever, headache, neck pain, or leukocytosis. In view of the atypical nature of the lesions and her history of breast cancer, a lumbar puncture was performed. Fluid was clear, 12 white cells, 98% lymphocytes, protein 40, glucose 74, cultures remain sterile. No malignant cells seen on cytology. No oligoclonal bands were seen. Lyme antibodies not detected. Neurology recommended a empiric trial of high-dose steroids. This appeared most likely to be a viral meningitis although with atypical features especially the absence of headache. Atypical vasculitis also in the differential. She has had a complete neurologic recovery. She currently denies any headache, dysarthria, focal weakness, paresthesias, or recurrent changes in vision.     Interim History:   Overall doing well.  Since last visit she had a Myoview stress test in January 2019 to evaluate atypical chest pain which was normal.  She reports no cardiorespiratory complaints at this time. Most recent colonoscopy done in July 2018: Single small sessile polyp removed from the distal sigmoid.  Pathology: Tubular adenoma. Most recent follow-up mammogram done August 04, 2018 with chronic scarring in the right breast.  No suspicious lesions. Carotid Doppler studies done when a bruit was heard in April 2018 showed 40-59% right internal carotid stenosis. 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  Her  mother is now 72 and has progressive dementia.  Ms. Markert is her primary caregiver and gets little help from her sister who also lives  close by. She just lost 1 of her brothers with metastatic esophageal cancer a few months ago he was only 73 years old.  Another brother was successfully treated for esophageal cancer. Medications: reviewed  Allergies:  Allergies  Allergen Reactions  . Bee Venom Anaphylaxis  . Hornet Venom Anaphylaxis    Anaphylaxis Shock SHOCK  . Reglan [Metoclopramide] Shortness Of Breath and Other (See Comments)    Tongue got numb; didn't feel good  . Vitamin K Anaphylaxis and Rash    "kills me" iv  . Benadryl [Diphenhydramine Hcl] Rash  . Ciprofloxacin Rash  . Crestor [Rosuvastatin Calcium] Other (See Comments)    Muscle Aches  . Cymbalta [Duloxetine Hcl] Swelling    Swelling of tongue and thrush  . Daypro [Oxaprozin] Rash    Rash  . Doxycycline Other (See Comments)    Tongue burning, thrush  . Levofloxacin Rash  . Sulfonamide Derivatives Rash  . Zocor [Simvastatin] Other (See Comments)    Muscle Aches    Review of Systems: See interim history Remaining ROS negative:   Physical Exam: Blood pressure 140/67, pulse 62, temperature 97.8 F (36.6 C), temperature source Oral, height 5\' 5"  (1.651 m), weight 176 lb (79.8 kg), SpO2 98 %. Wt Readings from Last 3 Encounters:  09/19/18 176 lb (79.8 kg)  03/06/18 175 lb (79.4 kg)  02/20/18 175 lb (79.4 kg)     General appearance: Well-nourished Caucasian woman HENNT: Pharynx no erythema, exudate, mass, or ulcer.  Status post thyroidectomy.  Midline scar in the neck Lymph nodes: No cervical, supraclavicular, or axillary lymphadenopathy Breasts: Status post right lumpectomy with subsequent hematoma formation leaving skin deformity in the right breast.  Area of superficial nodularity upper inner quadrant which feels like either suture material or calcifications.  No correlation on recent mammogram. Lungs: Clear to auscultation, resonant to percussion throughout Heart: Regular rhythm, no murmur, no gallop, no rub, no click, no edema Abdomen: Soft,  nontender, normal bowel sounds, no mass, no organomegaly.  Vertical midline scar status post emergency surgery when she was in her 8s. Extremities: No edema, no calf tenderness Musculoskeletal: no joint deformities GU:  Vascular: Carotid pulses 2+, no bruits, distal pulses:  Neurologic: Alert, oriented, PERRLA, optic discs sharp and vessels normal, no hemorrhage or exudate, cranial nerves grossly normal, motor strength 5 over 5, reflexes 1+ symmetric, upper body coordination normal, gait normal, Skin: No rash or ecchymosis  Lab Results: CBC W/Diff    Component Value Date/Time   WBC 7.3 09/19/2018 1203   WBC 11.6 (H) 02/13/2018 2040   RBC 4.44 09/19/2018 1203   RBC 3.68 (L) 02/13/2018 2040   HGB 13.3 09/19/2018 1203   HGB 13.0 02/12/2015 1122   HCT 41.3 09/19/2018 1203   HCT 38.8 02/12/2015 1122   PLT 258 09/19/2018 1203   MCV 93 09/19/2018 1203   MCV 92.8 02/12/2015 1122   MCH 30.0 09/19/2018 1203   MCH 30.7 02/13/2018 2040   MCHC 32.2 09/19/2018 1203   MCHC 31.8 02/13/2018 2040   RDW 13.4 09/19/2018 1203   RDW 14.0 02/12/2015 1122   LYMPHSABS 2.3 09/19/2018 1203   LYMPHSABS 2.9 02/12/2015 1122   MONOABS 0.6 08/02/2016 1445   MONOABS 0.6 02/12/2015 1122   EOSABS 0.2 09/19/2018 1203   BASOSABS 0.1 09/19/2018 1203   BASOSABS 0.1 02/12/2015 1122     Chemistry  Component Value Date/Time   NA 140 09/19/2018 1203   NA 140 04/09/2014 1513   K 4.5 09/19/2018 1203   K 3.9 04/09/2014 1513   CL 101 09/19/2018 1203   CL 102 10/19/2012 1301   CO2 23 09/19/2018 1203   CO2 24 04/09/2014 1513   BUN 24 09/19/2018 1203   BUN 16.8 04/09/2014 1513   CREATININE 1.30 (H) 09/19/2018 1203   CREATININE 1.4 (H) 04/09/2014 1513      Component Value Date/Time   CALCIUM 9.5 09/19/2018 1203   CALCIUM 8.7 04/09/2014 1513   ALKPHOS 95 09/19/2018 1203   ALKPHOS 101 04/09/2014 1513   AST 24 09/19/2018 1203   AST 15 04/09/2014 1513   ALT 21 09/19/2018 1203   ALT 13 04/09/2014 1513    BILITOT 0.3 09/19/2018 1203   BILITOT 0.41 04/09/2014 1513         Impression:  #1. 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 She continues on chronic Coumadin anticoagulation.  I will transfer her hematology oncology care to Dr. Irene Limbo at the cancer center at this time.  #2. Stage I, ER-positive, breast cancer treated as outlined above. No evidence for recurrence now out over 20 years. continue annual mammograms.  #3. Status post redo Nissen fundoplication for hiatus hernia  #4. Chronic anxiety and depression.  #6. Status post thyroidectomy  #7. 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  CC: Patient Care Team: [name]., PA-C as PCP - General (Family Medicine) Minus Breeding, MD as PCP - Cardiology (Cardiology) [facility]   Murriel Hopper, MD, FACP  Hematology-Oncology/Internal Medicine     2/26/20202:46 PM

## 2018-09-22 ENCOUNTER — Telehealth: Payer: Self-pay | Admitting: *Deleted

## 2018-09-22 NOTE — Telephone Encounter (Signed)
-----   Message from Annia Belt, MD sent at 09/20/2018  1:56 PM EST ----- Call pt: she is a little dehydrated - keep up with fluids; rest of lab looks good.

## 2018-09-22 NOTE — Telephone Encounter (Signed)
Pt called / informed "she is a little dehydrated - keep up with fluids; rest of lab looks good. " per Dr Beryle Beams. Stated she drinks plenty of water; but she will drink more.

## 2018-10-11 ENCOUNTER — Encounter: Payer: Self-pay | Admitting: Pharmacist

## 2018-10-11 ENCOUNTER — Other Ambulatory Visit: Payer: Self-pay | Admitting: Pharmacist

## 2018-10-11 DIAGNOSIS — Z9229 Personal history of other drug therapy: Secondary | ICD-10-CM

## 2018-10-11 DIAGNOSIS — D6861 Antiphospholipid syndrome: Secondary | ICD-10-CM

## 2018-10-11 DIAGNOSIS — Z7901 Long term (current) use of anticoagulants: Secondary | ICD-10-CM

## 2018-10-11 DIAGNOSIS — Z86718 Personal history of other venous thrombosis and embolism: Secondary | ICD-10-CM

## 2018-10-11 DIAGNOSIS — Z8673 Personal history of transient ischemic attack (TIA), and cerebral infarction without residual deficits: Secondary | ICD-10-CM

## 2018-10-11 DIAGNOSIS — R760 Raised antibody titer: Secondary | ICD-10-CM

## 2018-10-11 DIAGNOSIS — Z862 Personal history of diseases of the blood and blood-forming organs and certain disorders involving the immune mechanism: Secondary | ICD-10-CM

## 2018-10-13 ENCOUNTER — Telehealth: Payer: Self-pay | Admitting: Hematology

## 2018-10-13 ENCOUNTER — Other Ambulatory Visit: Payer: Self-pay | Admitting: *Deleted

## 2018-10-13 DIAGNOSIS — D6861 Antiphospholipid syndrome: Secondary | ICD-10-CM

## 2018-10-13 DIAGNOSIS — Z7901 Long term (current) use of anticoagulants: Secondary | ICD-10-CM

## 2018-10-13 DIAGNOSIS — Z8673 Personal history of transient ischemic attack (TIA), and cerebral infarction without residual deficits: Secondary | ICD-10-CM

## 2018-10-13 DIAGNOSIS — R760 Raised antibody titer: Secondary | ICD-10-CM

## 2018-10-13 DIAGNOSIS — Z86718 Personal history of other venous thrombosis and embolism: Secondary | ICD-10-CM

## 2018-10-13 DIAGNOSIS — Z862 Personal history of diseases of the blood and blood-forming organs and certain disorders involving the immune mechanism: Secondary | ICD-10-CM

## 2018-10-13 DIAGNOSIS — Z9229 Personal history of other drug therapy: Secondary | ICD-10-CM

## 2018-10-13 NOTE — Addendum Note (Signed)
Addended by: Hulan Fray on: 10/13/2018 03:03 PM   Modules accepted: Orders

## 2018-10-13 NOTE — Telephone Encounter (Signed)
Pt states that she doesn't need to schedule an appt at this time. She is going to her PCP.

## 2018-10-16 ENCOUNTER — Ambulatory Visit: Payer: Medicare HMO

## 2018-11-06 ENCOUNTER — Other Ambulatory Visit: Payer: Self-pay | Admitting: Neurology

## 2019-02-08 ENCOUNTER — Other Ambulatory Visit: Payer: Self-pay | Admitting: Family Medicine

## 2019-02-08 DIAGNOSIS — N6310 Unspecified lump in the right breast, unspecified quadrant: Secondary | ICD-10-CM

## 2019-02-08 DIAGNOSIS — N6313 Unspecified lump in the right breast, lower outer quadrant: Secondary | ICD-10-CM | POA: Insufficient documentation

## 2019-02-09 ENCOUNTER — Ambulatory Visit
Admission: RE | Admit: 2019-02-09 | Discharge: 2019-02-09 | Disposition: A | Discharge status: Home / Self Care | Payer: Medicare HMO | Source: Ambulatory Visit | Attending: Family Medicine | Admitting: Family Medicine

## 2019-02-09 ENCOUNTER — Other Ambulatory Visit: Payer: Self-pay

## 2019-02-09 DIAGNOSIS — N6310 Unspecified lump in the right breast, unspecified quadrant: Secondary | ICD-10-CM

## 2019-09-17 ENCOUNTER — Other Ambulatory Visit: Payer: Self-pay | Admitting: Family Medicine

## 2019-09-17 DIAGNOSIS — Z1231 Encounter for screening mammogram for malignant neoplasm of breast: Secondary | ICD-10-CM

## 2019-10-03 ENCOUNTER — Ambulatory Visit
Admission: RE | Admit: 2019-10-03 | Discharge: 2019-10-03 | Disposition: A | Discharge status: Home / Self Care | Payer: Medicare HMO | Source: Ambulatory Visit | Attending: Family Medicine | Admitting: Family Medicine

## 2019-10-03 ENCOUNTER — Other Ambulatory Visit: Payer: Self-pay

## 2019-10-03 DIAGNOSIS — Z1231 Encounter for screening mammogram for malignant neoplasm of breast: Secondary | ICD-10-CM

## 2020-01-21 ENCOUNTER — Encounter (HOSPITAL_COMMUNITY): Payer: Self-pay

## 2020-01-21 ENCOUNTER — Other Ambulatory Visit: Payer: Self-pay

## 2020-01-21 ENCOUNTER — Emergency Department (HOSPITAL_COMMUNITY)
Admission: EM | Admit: 2020-01-21 | Discharge: 2020-01-21 | Disposition: A | Discharge status: Home / Self Care | Payer: Medicare HMO | Attending: Emergency Medicine | Admitting: Emergency Medicine

## 2020-01-21 DIAGNOSIS — Z7901 Long term (current) use of anticoagulants: Secondary | ICD-10-CM | POA: Insufficient documentation

## 2020-01-21 DIAGNOSIS — J45909 Unspecified asthma, uncomplicated: Secondary | ICD-10-CM | POA: Insufficient documentation

## 2020-01-21 DIAGNOSIS — Z79899 Other long term (current) drug therapy: Secondary | ICD-10-CM | POA: Diagnosis not present

## 2020-01-21 DIAGNOSIS — I10 Essential (primary) hypertension: Secondary | ICD-10-CM | POA: Diagnosis not present

## 2020-01-21 DIAGNOSIS — K625 Hemorrhage of anus and rectum: Secondary | ICD-10-CM | POA: Insufficient documentation

## 2020-01-21 DIAGNOSIS — Z7982 Long term (current) use of aspirin: Secondary | ICD-10-CM | POA: Diagnosis not present

## 2020-01-21 LAB — COMPREHENSIVE METABOLIC PANEL
ALT: 18 U/L (ref 0–44)
AST: 28 U/L (ref 15–41)
Albumin: 4.4 g/dL (ref 3.5–5.0)
Alkaline Phosphatase: 88 U/L (ref 38–126)
Anion gap: 10 (ref 5–15)
BUN: 16 mg/dL (ref 8–23)
CO2: 28 mmol/L (ref 22–32)
Calcium: 9.2 mg/dL (ref 8.9–10.3)
Chloride: 100 mmol/L (ref 98–111)
Creatinine, Ser: 1.27 mg/dL — ABNORMAL HIGH (ref 0.44–1.00)
GFR calc Af Amer: 49 mL/min — ABNORMAL LOW (ref >60)
GFR calc non Af Amer: 42 mL/min — ABNORMAL LOW (ref >60)
Glucose, Bld: 125 mg/dL — ABNORMAL HIGH (ref 70–99)
Potassium: 4.7 mmol/L (ref 3.5–5.1)
Sodium: 138 mmol/L (ref 135–145)
Total Bilirubin: 0.7 mg/dL (ref 0.3–1.2)
Total Protein: 8 g/dL (ref 6.5–8.1)

## 2020-01-21 LAB — CBC
HCT: 41.6 % (ref 36.0–46.0)
Hemoglobin: 13.4 g/dL (ref 12.0–15.0)
MCH: 30.7 pg (ref 26.0–34.0)
MCHC: 32.2 g/dL (ref 30.0–36.0)
MCV: 95.4 fL (ref 80.0–100.0)
Platelets: 220 10*3/uL (ref 150–400)
RBC: 4.36 MIL/uL (ref 3.87–5.11)
RDW: 13.4 % (ref 11.5–15.5)
WBC: 7.1 10*3/uL (ref 4.0–10.5)
nRBC: 0.3 % — ABNORMAL HIGH (ref 0.0–0.2)

## 2020-01-21 LAB — POC OCCULT BLOOD, ED: Fecal Occult Bld: NEGATIVE

## 2020-01-21 LAB — PROTIME-INR
INR: 1.5 — ABNORMAL HIGH (ref 0.8–1.2)
Prothrombin Time: 17.5 seconds — ABNORMAL HIGH (ref 11.4–15.2)

## 2020-01-21 NOTE — ED Notes (Signed)
Sent a blue and dark green top to lab

## 2020-01-21 NOTE — ED Triage Notes (Addendum)
Patient states she has a toilet that changes color and does not know exactly when the rectal bleeding begin, but turned the light on last night and saw bright red bleeding. Patient states she has been having abdominal pain and cramping for approx 2 weeks.  Patient states she is taking Coumadin.

## 2020-01-21 NOTE — ED Notes (Signed)
Pt verbalizes understanding of DC instructions. Pt belongings returned and is ambulatory out of ED.  

## 2020-01-21 NOTE — ED Provider Notes (Signed)
[facility] DEPT Provider Note   CSN: [id number] Arrival date & time: 01/21/20  1551     History Chief Complaint  Patient presents with  . Rectal Bleeding    [name] is a 74 y.o. female.  74 yo F with chief complaints of bright red blood per rectum.  Patient had one episode of this last night.  Has had no continued episodes today.  She has been having some abdominal cramping off and on for the past couple weeks.  She has a light in her toilet bowl and the changes colors and so she thinks she could have had blood in her stool previously but did not notice it.  Not having significant abdominal pain now.  No diarrhea.  Not feeling lightheaded or dizzy.  No fevers or chills.  No recent travel or suspicious food intake.  The patient is concerned because she had a sibling that had colon cancer and it was diagnosed when they had similar symptoms.  She does get regular colonoscopies and has seen her GI doctor recently and try to get them to have repeated.  They were not within the typical 5-year window.  The history is provided by the patient.  Rectal Bleeding Quality:  Bright red Amount:  Moderate Duration:  1 day Timing:  Rare Chronicity:  New Similar prior episodes: no   Relieved by:  Nothing Worsened by:  Nothing Ineffective treatments:  None tried Associated symptoms: abdominal pain   Associated symptoms: no dizziness, no fever and no vomiting   Risk factors: anticoagulant use        Past Medical History:  Diagnosis Date  . Anxiety    severe panic attacks  . Arthritis   . Aseptic meningitis   . Asthma    as child  . Breast cancer ([place])    right  . Chronic anticoagulation 08/04/2015  . Collapsed lung    hx of, left  . Complication of anesthesia    "hard to put asleep"  . Depression   . Fibromyalgia   . GERD (gastroesophageal reflux disease)   . H/O hiatal hernia   . Headache(784.0)   . Hepatitis 1990   "from eating at  restaurant"  . History of antiphospholipid antibody syndrome   . 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  . Hx-TIA (transient ischemic attack)   . Hyperlipidemia   . Hypertension   . Hypothyroidism   . Neuromuscular disorder ([place])   . Personal history of radiation therapy   . Pneumonia    hx of  . Soft tissue mass 06/14/2017   3 cm right post axilla same side as previous breast cancer 06/14/17  . Stroke ([place])   . Thrombosis, upper extremity artery ([place]) 04/17/2012   Left digital artery  October 1998 - new dx antiphospholipid antibody syndrome    Patient Active Problem List   Diagnosis Date Noted  . Bilateral primary osteoarthritis of knee 12/27/2017  . Acute infection of nasal sinus 07/21/2017  . Prediabetes 06/29/2017  . Soft tissue mass 06/14/2017  . Displaced fracture of fifth metatarsal bone of right foot 02/07/2017  . Unilateral primary osteoarthritis, left knee 11/26/2016  . Unilateral primary osteoarthritis, right knee 11/26/2016  . Left leg pain   . Abnormal MRI of head   . Vision changes 04/13/2016  . Orthostatic hypotension 03/10/2016  . Vertigo 03/10/2016  . Depression 11/25/2015  . Essential (primary) hypertension 11/25/2015  . Hyperlipidemia 11/25/2015  .  Temporary cerebral vascular dysfunction 11/25/2015  . Primary malignant neoplasm ([place]) 11/25/2015  . Sleep apnea 11/25/2015  . Chronic anticoagulation 08/04/2015  . History of anticoagulant therapy 08/04/2015  . Pain in limb 12/26/2013  . Chest pain 11/27/2013  . GERD - post failed open Nissen 11/23/2012  . Dysphagia, unspecified(787.20) 09/28/2012  . Dysphagia 09/28/2012  . S/P Open Nissen 1998 Dr. Mendel Ryder 08/18/2012  . Thrombosis, upper extremity artery ([place]) 04/17/2012  . Antiphospholipid antibody syndrome ([place]) 06/01/2011  . Lupus anticoagulant positive 06/01/2011  . Chest pain   . Hx-TIA (transient ischemic attack)   . Hypothyroidism   . Breast cancer ([place])   . History of DVT  (deep vein thrombosis)   . History of antiphospholipid antibody syndrome     Past Surgical History:  Procedure Laterality Date  . ABDOMINAL HYSTERECTOMY    . BREAST LUMPECTOMY Right 2000  . BREAST SURGERY Right    x3  . BUNIONECTOMY Bilateral 30 years ago  . CARDIAC CATHETERIZATION  07/12/1997   NORMAL LEFT VENTRICULAR FUNCTION WITH EF AT LEAST 70-75%  . ESOPHAGEAL MANOMETRY N/A 11/06/2012   Procedure: ESOPHAGEAL MANOMETRY (EM);  Surgeon: Pedro Earls, MD;  Location: [facility] END[facility];  Service: General;  Laterality: N/A;  . ESOPHAGOGASTRODUODENOSCOPY ENDOSCOPY    . KNEE ARTHROSCOPY Right   . LAPAROSCOPIC NISSEN FUNDOPLICATION N/A 9/41/7408   Procedure: LAPAROSCOPIC TAKEDOWN  PERI-HIATAL HERNIA    REPAIR;  Surgeon: Pedro Earls, MD;  Location: [facility] ORS;  Service: General;  Laterality: N/A;  . [place]  . RIGHT OOPHORECTOMY  1980  . THYROIDECTOMY  in 20's  . TONSILLECTOMY  74 years old  . UPPER GI ENDOSCOPY N/A 12/12/2012   Procedure: UPPER GI ENDOSCOPY;  Surgeon: Pedro Earls, MD;  Location: [facility]S;  Service: General;  Laterality: N/A;     OB History   No obstetric history on file.     Family History  Problem Relation Age of Onset  . Alzheimer's disease Mother   . Heart disease Mother   . Hyperlipidemia Mother   . Hypertension Mother   . Cancer Maternal Grandmother        colon  . Heart disease Sister   . Hypertension Sister   . Heart attack Sister   . Heart disease Brother   . Heart attack Brother   . Cancer Daughter   . Hypertension Daughter   . Hypertension Son     Social History   Tobacco Use  . Smoking status: Never Smoker  . Smokeless tobacco: Never Used  Vaping Use  . Vaping Use: Never used  Substance Use Topics  . Alcohol use: No    Alcohol/week: 0.0 standard drinks  . Drug use: No    Home Medications Prior to Admission medications   Medication Sig Start Date End Date Taking? Authorizing Provider  ALPRAZolam [name]) 1 MG  tablet Take 1 mg by mouth 2 (two) times daily.  12/22/19  Yes [provider]  Ascorbic Acid (VITAMIN C) 1000 MG tablet Take 1,000 mg by mouth daily.   Yes [provider]  aspirin 81 MG tablet Take 81 mg by mouth every evening.    Yes [provider]  ALPRAZolam [name]) 0.5 MG tablet Take 1 mg by mouth See admin instructions. Every evening and sometimes during the day if needed for anxiety Patient not taking: Reported on 01/21/2020 06/07/12   [provider]  baclofen (LIORESAL) 20 MG tablet Take by mouth. Patient not taking: Reported on  01/21/2020 01/04/20   [provider]  CALCIUM CARBONATE PO Take 1 tablet by mouth daily.    [provider]  Cyanocobalamin (VITAMIN B 12 PO) Take 0.5-1 mg by mouth daily.    [provider]  fluticasone (FLONASE) 50 MCG/ACT nasal spray Place 2 sprays into the nose daily as needed for allergies.  10/16/14   [provider]  gabapentin (NEURONTIN) 300 MG capsule TAKE 1 TO 2 CAPSULES THREE TIMES DAILY Patient taking differently: Take 300 mg by mouth 2 (two) times daily. Pt is tapering off 10/21/17   Marcial Pacas, MD  HYDROcodone-acetaminophen (NORCO/VICODIN) 5-325 MG per tablet Take 1 tablet by mouth 2 (two) times daily.     [provider]  levothyroxine (SYNTHROID, LEVOTHROID) 112 MCG tablet Take 112 mcg by mouth daily before breakfast. Alternate with the 145mcg    [provider]  levothyroxine (SYNTHROID, LEVOTHROID) 125 MCG tablet Take 125 mcg by mouth daily. Alternate with 178mcg 06/24/16   [provider]  lidocaine (XYLOCAINE) 2 % solution Use as directed 15 mLs in the mouth or throat every 4 (four) hours as needed for mouth pain.    [provider]  magnesium oxide (MAG-OX) 400 MG tablet Take 400 mg by mouth daily.    [provider]  metoprolol succinate (TOPROL-XL) 25 MG 24 hr tablet Take 12.5 mg by mouth 2 (two) times daily.     [provider]  omeprazole (PRILOSEC) 20 MG capsule Take 20 mg by mouth daily.  02/06/13   [provider]  phenazopyridine (PYRIDIUM) 100 MG tablet Take 100 mg by mouth. 12/17/19   [provider]  pravastatin (PRAVACHOL) 20 MG tablet Take 20 mg by mouth daily.    Christain Sacramento, MD  pravastatin (PRAVACHOL) 40 MG tablet Take 40 mg by mouth at bedtime. 01/15/20   [provider]  sertraline (ZOLOFT) 50 MG tablet Take 50 mg by mouth daily. 01/20/20   [provider]  warfarin (COUMADIN) 5 MG tablet Take 5 mg daily except on Mondays: take 2.5 mg (1/2 tablet) 05/11/18   Annia Belt, MD    Allergies    Bee venom, Hornet venom, Reglan [metoclopramide], Vitamin k, Baclofen, Benadryl [diphenhydramine hcl], Ciprofloxacin, Crestor [rosuvastatin calcium], Cymbalta [duloxetine hcl], Daypro [oxaprozin], Doxycycline, Levofloxacin, Sulfonamide derivatives, and Zocor [simvastatin]  Review of Systems   Review of Systems  Constitutional: Negative for chills and fever.  HENT: Negative for congestion and rhinorrhea.   Eyes: Negative for redness and visual disturbance.  Respiratory: Negative for shortness of breath and wheezing.   Cardiovascular: Negative for chest pain and palpitations.  Gastrointestinal: Positive for abdominal pain, blood in stool and hematochezia. Negative for nausea and vomiting.  Genitourinary: Negative for dysuria and urgency.  Musculoskeletal: Negative for arthralgias and myalgias.  Skin: Negative for pallor and wound.  Neurological: Negative for dizziness and headaches.    Physical Exam Updated Vital Signs BP 128/74   Pulse 95   Temp 98.1 F (36.7 C) (Oral)   Resp 18   Ht 5\' 5"  (1.651 m)   Wt 73.5 kg   SpO2 98%   BMI 26.96 kg/m   Physical Exam Vitals and nursing note reviewed.  Constitutional:      General: She is not in acute distress.    Appearance: She is well-developed. She is not diaphoretic.  HENT:     Head:  Normocephalic and atraumatic.  Eyes:     Pupils: Pupils are equal, round, and reactive to  light.  Cardiovascular:     Rate and Rhythm: Normal rate and regular rhythm.     Heart sounds: No murmur heard.  No friction rub. No gallop.   Pulmonary:     Effort: Pulmonary effort is normal.     Breath sounds: No wheezing or rales.  Abdominal:     General: There is no distension.     Palpations: Abdomen is soft.     Tenderness: There is no abdominal tenderness.     Comments: Benign abdominal exam  Genitourinary:    Comments: Hard brown stool in the vault.  No gross blood.  No hemorrhoids. Musculoskeletal:        General: No tenderness.     Cervical back: Normal range of motion and neck supple.  Skin:    General: Skin is warm and dry.  Neurological:     Mental Status: She is alert and oriented to person, place, and time.  Psychiatric:        Behavior: Behavior normal.     ED Results / Procedures / Treatments   Labs (all labs ordered are listed, but only abnormal results are displayed) Labs Reviewed  COMPREHENSIVE METABOLIC PANEL - Abnormal; Notable for the following components:      Result Value   Glucose, Bld 125 (*)    Creatinine, Ser 1.27 (*)    GFR calc non Af Amer 42 (*)    GFR calc Af Amer 49 (*)    All other components within normal limits  CBC - Abnormal; Notable for the following components:   nRBC 0.3 (*)    All other components within normal limits  PROTIME-INR - Abnormal; Notable for the following components:   Prothrombin Time 17.5 (*)    INR 1.5 (*)    All other components within normal limits  POC OCCULT BLOOD, ED  TYPE AND SCREEN    EKG None  Radiology No results found.  Procedures Procedures (including critical care time)  Medications Ordered in ED Medications - No data to display  ED Course  I have reviewed the triage vital signs and the nursing notes.  Pertinent labs & imaging results that were available during my care of the patient were  reviewed by me and considered in my medical decision making (see chart for details).    MDM Rules/Calculators/A&P                          74 yo F with a chief complaints of bright red blood per rectum.  This actually occurred yesterday and has not recurred today.  She has a benign abdominal exam here.  No blood on my exam.  Hemoglobin is normal(13.4).  She is on Coumadin and so we will check an INR here.  INR is 1.5.  Point-of-care occult negative for microscopic blood.  Will discharge home GI follow-up.  9:38 PM:  I have discussed the diagnosis/risks/treatment options with the patient and believe the pt to be eligible for discharge home to follow-up with PCP. We also discussed returning to the ED immediately if new or worsening sx occur. We discussed the sx which are most concerning (e.g., sudden worsening pain, fever, inability to tolerate by mouth) that necessitate immediate return. Medications administered to the patient during their visit and any new prescriptions provided to the patient are listed below.  Medications given during this visit Medications - No data to display   The patient appears reasonably screen and/or stabilized for discharge  and I doubt any other medical condition or other [facility] requiring further screening, evaluation, or treatment in the ED at this time prior to discharge.   Final Clinical Impression(s) / ED Diagnoses Final diagnoses:  Rectal bleeding    Rx / DC Orders ED Discharge Orders    None       Deno Etienne, DO 01/21/20 2138

## 2020-01-21 NOTE — Discharge Instructions (Signed)
The card sent to the lab did not show any microscopic blood.  Your hemoglobin was normal.  Please call your gastroenterologist and see when they want to see you in the office.  Please return for worsening bleeding or if you feel like you are going to pass out or do pass out.

## 2020-01-22 LAB — TYPE AND SCREEN
ABO/RH(D): A POS
Antibody Screen: POSITIVE

## 2020-02-06 ENCOUNTER — Other Ambulatory Visit: Payer: Self-pay | Admitting: Gastroenterology

## 2020-02-06 DIAGNOSIS — R1031 Right lower quadrant pain: Secondary | ICD-10-CM

## 2020-02-06 DIAGNOSIS — K921 Melena: Secondary | ICD-10-CM

## 2020-02-06 DIAGNOSIS — R634 Abnormal weight loss: Secondary | ICD-10-CM

## 2020-02-13 ENCOUNTER — Other Ambulatory Visit: Payer: Medicare HMO

## 2020-02-21 ENCOUNTER — Ambulatory Visit
Admission: RE | Admit: 2020-02-21 | Discharge: 2020-02-21 | Disposition: A | Discharge status: Home / Self Care | Payer: Medicare HMO | Source: Ambulatory Visit | Attending: Gastroenterology | Admitting: Gastroenterology

## 2020-02-21 ENCOUNTER — Other Ambulatory Visit: Payer: Self-pay

## 2020-02-21 DIAGNOSIS — K921 Melena: Secondary | ICD-10-CM

## 2020-02-21 DIAGNOSIS — R634 Abnormal weight loss: Secondary | ICD-10-CM

## 2020-02-21 DIAGNOSIS — R1031 Right lower quadrant pain: Secondary | ICD-10-CM

## 2020-03-13 ENCOUNTER — Ambulatory Visit: Payer: Medicare HMO | Attending: Internal Medicine

## 2020-03-13 DIAGNOSIS — Z23 Encounter for immunization: Secondary | ICD-10-CM

## 2020-03-13 NOTE — Progress Notes (Signed)
   Covid-19 Vaccination Clinic  Name:  [name]    MRN: [id number] DOB: 29-Oct-1945  03/13/2020  [name] was observed post Covid-19 immunization for 15 minutes without incident. She was provided with Vaccine Information Sheet and instruction to access the V-Safe system.   [name] was instructed to call 911 with any severe reactions post vaccine: [name] Difficulty breathing  . Swelling of face and throat  . A fast heartbeat  . A bad rash all over body  . Dizziness and weakness   Immunizations Administered    Name Date Dose VIS Date Route   Pfizer COVID-19 Vaccine 03/13/2020 10:53 AM 0.3 mL 09/19/2018 Intramuscular   Manufacturer: [place]   Lot: [id number]   [place]: [id number]

## 2020-04-03 ENCOUNTER — Ambulatory Visit: Payer: Medicare HMO | Attending: Internal Medicine

## 2020-04-03 DIAGNOSIS — Z23 Encounter for immunization: Secondary | ICD-10-CM

## 2020-04-03 NOTE — Progress Notes (Signed)
   Covid-19 Vaccination Clinic  Name:  [name]    MRN: [id number] DOB: 1945/12/02  04/03/2020  [name] was observed post Covid-19 immunization for 30 minutes based on pre-vaccination screening without incident. She was provided with Vaccine Information Sheet and instruction to access the V-Safe system.   [name] was instructed to call 911 with any severe reactions post vaccine: [name] Difficulty breathing  . Swelling of face and throat  . A fast heartbeat  . A bad rash all over body  . Dizziness and weakness   Immunizations Administered    Name Date Dose VIS Date Route   Pfizer COVID-19 Vaccine 04/03/2020 10:40 AM 0.3 mL 09/19/2018 Intramuscular   Manufacturer: [employer], [employer]   Lot: [id number]   [place]: [address]-5

## 2020-04-07 DIAGNOSIS — M2669 Other specified disorders of temporomandibular joint: Secondary | ICD-10-CM | POA: Insufficient documentation

## 2020-04-07 DIAGNOSIS — H6121 Impacted cerumen, right ear: Secondary | ICD-10-CM | POA: Insufficient documentation

## 2020-04-08 ENCOUNTER — Other Ambulatory Visit: Payer: Self-pay | Admitting: Cardiology

## 2020-04-08 NOTE — Telephone Encounter (Signed)
*  STAT* If patient is at the pharmacy, call can be transferred to refill team.   1. Which medications need to be refilled? (please list name of each medication and dose if known)       metoprolol succinate (TOPROL-XL) 25 MG 24 hr tablet      2. Which pharmacy/location (including street and city if local pharmacy) is medication to be sent to? [place], [place], [place] [id number]  3. Do they need a 30 day or 90 day supply? 90 day supply

## 2020-04-09 NOTE — Telephone Encounter (Signed)
Metoprolol refill request completed by providers at [facility].  Pt has been seen at [facility] since 2019.

## 2020-04-10 ENCOUNTER — Ambulatory Visit (INDEPENDENT_AMBULATORY_CARE_PROVIDER_SITE_OTHER): Payer: Medicare HMO

## 2020-04-10 ENCOUNTER — Encounter: Payer: Self-pay | Admitting: Orthopedic Surgery

## 2020-04-10 ENCOUNTER — Ambulatory Visit: Payer: Medicare HMO | Admitting: Orthopedic Surgery

## 2020-04-10 ENCOUNTER — Other Ambulatory Visit: Payer: Self-pay

## 2020-04-10 VITALS — Ht 65.0 in | Wt 162.0 lb

## 2020-04-10 DIAGNOSIS — M25511 Pain in right shoulder: Secondary | ICD-10-CM | POA: Diagnosis not present

## 2020-04-10 DIAGNOSIS — G8929 Other chronic pain: Secondary | ICD-10-CM | POA: Diagnosis not present

## 2020-04-10 DIAGNOSIS — M7541 Impingement syndrome of right shoulder: Secondary | ICD-10-CM | POA: Diagnosis not present

## 2020-04-10 MED ORDER — LIDOCAINE HCL 1 % IJ SOLN
5.0000 mL | INTRAMUSCULAR | Status: AC | PRN
  Administered 2020-04-10: 5 mL

## 2020-04-10 MED ORDER — METHYLPREDNISOLONE ACETATE 40 MG/ML IJ SUSP
40.0000 mg | INTRAMUSCULAR | Status: AC | PRN
  Administered 2020-04-10: 40 mg via INTRA_ARTICULAR

## 2020-04-10 NOTE — Progress Notes (Signed)
Office Visit Note   Patient: [name]           Date of Birth: [birth year]           MRN: [id number] Visit Date: 04/10/2020              Requested by: [name]., PA-C [address],  [place] [address] PCP: [name]., PA-C  Chief Complaint  Patient presents with  . Right Shoulder - Pain      HPI: Patient is a 74 year old woman who complains of right shoulder pain with elevation abduction and internal rotation she states the pain radiates from her shoulder down her biceps.  She states she cannot lift her arm above her chest.  She has been taking care of her mother at home and does a lot of lifting.  Assessment & Plan: Visit Diagnoses:  1. Chronic right shoulder pain   2. Impingement syndrome of right shoulder     Plan: The shoulder was injected she will continue activities of daily living without restrictions.  Follow-Up Instructions: Return in about 4 weeks (around 05/08/2020).   Ortho Exam  Patient is alert, oriented, no adenopathy, well-dressed, normal affect, normal respiratory effort. Examination patient has active abduction flexion to 70 degrees passively she has apprehension and pain at 90 degrees of abduction and flexion.  She also has pain with internal and external rotation of 45 degrees.  Neer and Hawkins and impingement test are painful.  Imaging: XR Shoulder Right  Result Date: 04/10/2020 2 view radiographs of the right shoulder shows decreased subacromial joint space the glenohumeral joint is congruent no subchondral or cystic changes.  No images are attached to the encounter.  Labs: Lab Results  Component Value Date   HGBA1C 6.3 (H) 06/28/2017   HGBA1C 6.0 (H) 04/14/2016   ESRSEDRATE 5 06/28/2017   ESRSEDRATE 11 04/18/2016   ESRSEDRATE 11 11/04/2009   CRP 3.6 06/28/2017   CRP 1.3 (H) 04/18/2016   REPTSTATUS 04/20/2016 FINAL 04/16/2016   GRAMSTAIN  04/16/2016    CYTOSPIN SMEAR WBC PRESENT, PREDOMINANTLY  MONONUCLEAR NO ORGANISMS SEEN    CULT NO GROWTH 3 DAYS 04/16/2016     Lab Results  Component Value Date   ALBUMIN 4.4 01/21/2020   ALBUMIN 4.6 09/19/2018   ALBUMIN 4.2 01/11/2018    Lab Results  Component Value Date   MG 2.5 (H) 01/11/2018   MG 2.2 10/07/2016   MG 2.2 04/15/2016   Lab Results  Component Value Date   VD25OH 41.5 06/28/2017    No results found for: PREALBUMIN CBC EXTENDED Latest Ref Rng & Units 01/21/2020 09/19/2018 02/13/2018  WBC 4.0 - 10.5 K/uL 7.1 7.3 11.6(H)  RBC 3.87 - 5.11 MIL/uL 4.36 4.44 3.68(L)  HGB 12.0 - 15.0 g/dL 13.4 13.3 11.3(L)  HCT 36 - 46 % 41.6 41.3 35.5(L)  PLT 150 - 400 K/uL 220 258 234  NEUTROABS 1 - 7 x10E3/uL - 4.1 -  LYMPHSABS 0 - 3 x10E3/uL - 2.3 -     Body mass index is 26.96 kg/m.  Orders:  Orders Placed This Encounter  Procedures  . XR Shoulder Right   No orders of the defined types were placed in this encounter.    Procedures: Large Joint Inj: R subacromial bursa on 04/10/2020 11:37 AM Indications: diagnostic evaluation and pain Details: 22 G 1.5 in needle, posterior approach  Arthrogram: No  Medications: 5 mL lidocaine 1 %; 40 mg methylPREDNISolone acetate 40 MG/ML Outcome: tolerated well,  no immediate complications Procedure, treatment alternatives, risks and benefits explained, specific risks discussed. Consent was given by the patient. Immediately prior to procedure a time out was called to verify the correct patient, procedure, equipment, support staff and site/side marked as required. Patient was prepped and draped in the usual sterile fashion.      Clinical Data: No additional findings.  ROS:  All other systems negative, except as noted in the HPI. Review of Systems  Objective: Vital Signs: Ht 5\' 5"  (1.651 m)   Wt 162 lb (73.5 kg)   BMI 26.96 kg/m   Specialty Comments:  No specialty comments available.  PMFS History: Patient Active Problem List   Diagnosis Date Noted  . Bilateral primary  osteoarthritis of knee 12/27/2017  . Acute infection of nasal sinus 07/21/2017  . Prediabetes 06/29/2017  . Soft tissue mass 06/14/2017  . Displaced fracture of fifth metatarsal bone of right foot 02/07/2017  . Unilateral primary osteoarthritis, left knee 11/26/2016  . Unilateral primary osteoarthritis, right knee 11/26/2016  . Left leg pain   . Abnormal MRI of head   . Vision changes 04/13/2016  . Orthostatic hypotension 03/10/2016  . Vertigo 03/10/2016  . Depression 11/25/2015  . Essential (primary) hypertension 11/25/2015  . Hyperlipidemia 11/25/2015  . Temporary cerebral vascular dysfunction 11/25/2015  . Primary malignant neoplasm ([place]) 11/25/2015  . Sleep apnea 11/25/2015  . Chronic anticoagulation 08/04/2015  . History of anticoagulant therapy 08/04/2015  . Pain in limb 12/26/2013  . Chest pain 11/27/2013  . GERD - post failed open Nissen 11/23/2012  . Dysphagia, unspecified(787.20) 09/28/2012  . Dysphagia 09/28/2012  . S/P Open Nissen 1998 Dr. Mendel Ryder 08/18/2012  . 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 Lupus anticoagulant positive 06/01/2011  . Chest pain   . Hx-TIA (transient ischemic attack)   . Hypothyroidism   . Breast cancer ([place])   . History of DVT (deep vein thrombosis)   . History of antiphospholipid antibody syndrome    Past Medical History:  Diagnosis Date  . Anxiety    severe panic attacks  . Arthritis   . Aseptic meningitis   . Asthma    as child  . Breast cancer ([place])    right  . Chronic anticoagulation 08/04/2015  . Collapsed lung    hx of, left  . Complication of anesthesia    "hard to put asleep"  . Depression   . Fibromyalgia   . GERD (gastroesophageal reflux disease)   . H/O hiatal hernia   . Headache(784.0)   . Hepatitis 1990   "from eating at restaurant"  . History of antiphospholipid antibody syndrome   . 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   . Hx-TIA (transient ischemic attack)   . Hyperlipidemia   . Hypertension   . Hypothyroidism   . Neuromuscular disorder ([place])   . Personal history of radiation therapy   . Pneumonia    hx of  . Soft tissue mass 06/14/2017   3 cm right post axilla same side as previous breast cancer 06/14/17  . Stroke ([place])   . 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 Alzheimer's disease Mother   . Heart disease Mother   . Hyperlipidemia Mother   . Hypertension Mother   . Cancer Maternal Grandmother  colon  . Heart disease Sister   . Hypertension Sister   . Heart attack Sister   . Heart disease Brother   . Heart attack Brother   . Cancer Daughter   . Hypertension Daughter   . Hypertension Son     Past Surgical History:  Procedure Laterality Date  . ABDOMINAL HYSTERECTOMY    . BREAST LUMPECTOMY Right 2000  . BREAST SURGERY Right    x3  . BUNIONECTOMY Bilateral 30 years ago  . CARDIAC CATHETERIZATION  07/12/1997   NORMAL LEFT VENTRICULAR FUNCTION WITH EF AT LEAST 70-75%  . ESOPHAGEAL MANOMETRY N/A 11/06/2012   Procedure: ESOPHAGEAL MANOMETRY (EM);  Surgeon: Pedro Earls, MD;  Location: [facility];  Service: General;  Laterality: N/A;  . ESOPHAGOGASTRODUODENOSCOPY ENDOSCOPY    . KNEE ARTHROSCOPY Right   . LAPAROSCOPIC NISSEN FUNDOPLICATION N/A 3/55/7322   Procedure: LAPAROSCOPIC TAKEDOWN  PERI-HIATAL HERNIA    REPAIR;  Surgeon: Pedro Earls, MD;  Location: [facility];  Service: General;  Laterality: N/A;  . [place]  . RIGHT OOPHORECTOMY  1980  . THYROIDECTOMY  in 20's  . TONSILLECTOMY  74 years old  . UPPER GI ENDOSCOPY N/A 12/12/2012   Procedure: UPPER GI ENDOSCOPY;  Surgeon: Pedro Earls, MD;  Location: [facility];  Service: General;  Laterality: N/A;   Social History   Occupational History  . Not on file    Tobacco Use  . Smoking status: Never Smoker  . Smokeless tobacco: Never Used  Vaping Use  . Vaping Use: Never used  Substance and Sexual Activity  . Alcohol use: No    Alcohol/week: 0.0 standard drinks  . Drug use: No  . Sexual activity: Not on file

## 2020-04-30 NOTE — Progress Notes (Signed)
Cardiology Office Note   Date:  05/01/2020   ID:  Saga, Balthazar 1946/03/11, MRN [id number]  PCP:  [name]., PA-C  Cardiologist:   Minus Breeding, MD    Chief Complaint  Patient presents with  . Chest Pain      History of Present Illness: [name] is a 74 y.o. female who presents for evaluation of cramping.  I last saw her in 2018.  She has a history of chest pain with a normal heart catheterization in 1998 and a normal stress Myoview in March of 2012 and again in Jan 2019.    She presents for evaluation of leg pain.  She has had discomfort in her left upper thigh.  This has been sporadic.  She is not had any redness or swelling.  He has not had any trauma to this.  She is not sure whether it is like her previous fibromyalgia.  She is mostly presenting because of chest discomfort.  This is really under the left breast and radiates around the left side to the left mid back.  It happens at rest.  It is severe.  There is no associated nausea.  It is sharp.  It lasts for 3 to 6 minutes.  Its been getting worse over 8 months.  It happens spontaneously.  She cannot make it happen.  It goes away spontaneously.  She does not think it is like the previous pain she had back in 2018.  S  Past Medical History:  Diagnosis Date  . Anxiety    severe panic attacks  . Arthritis   . Aseptic meningitis   . Asthma    as child  . Breast cancer ([place])    right  . Chronic anticoagulation 08/04/2015  . Collapsed lung    hx of, left  . Complication of anesthesia    "hard to put asleep"  . Depression   . Fibromyalgia   . GERD (gastroesophageal reflux disease)   . H/O hiatal hernia   . Headache(784.0)   . Hepatitis 1990   "from eating at restaurant"  . History of antiphospholipid antibody syndrome   . 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  . Hx-TIA (transient ischemic attack)   . Hyperlipidemia   . Hypertension   . Hypothyroidism   .  Neuromuscular disorder ([place])   . Personal history of radiation therapy   . Pneumonia    hx of  . Soft tissue mass 06/14/2017   3 cm right post axilla same side as previous breast cancer 06/14/17  . Stroke ([place])   . 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 ABDOMINAL HYSTERECTOMY    . BREAST LUMPECTOMY Right 2000  . BREAST SURGERY Right    x3  . BUNIONECTOMY Bilateral 30 years ago  . CARDIAC CATHETERIZATION  07/12/1997   NORMAL LEFT VENTRICULAR FUNCTION WITH EF AT LEAST 70-75%  . ESOPHAGEAL MANOMETRY N/A 11/06/2012   Procedure: ESOPHAGEAL MANOMETRY (EM);  Surgeon: Pedro Earls, MD;  [facility]: [facility];  Service: General;  Laterality: N/A;  . ESOPHAGOGASTRODUODENOSCOPY ENDOSCOPY    . KNEE ARTHROSCOPY Right   . LAPAROSCOPIC NISSEN FUNDOPLICATION N/A 4/81/8563   Procedure: LAPAROSCOPIC TAKEDOWN  PERI-HIATAL HERNIA    REPAIR;  Surgeon: Pedro Earls, MD;  Location: [facility];  Service: General;  Laterality: N/A;  . LEFT OOPHORECTOMY  1982  . RIGHT OOPHORECTOMY  1980  . THYROIDECTOMY  in 20's  . TONSILLECTOMY  74 years old  . UPPER GI ENDOSCOPY N/A 12/12/2012   Procedure: UPPER GI ENDOSCOPY;  Surgeon: Pedro Earls, MD;  Location: [facility];  Service: General;  Laterality: N/A;     Current Outpatient Medications  Medication Sig Dispense Refill  . ALPRAZolam (XANAX) 1 MG tablet Take 1 mg by mouth 2 (two) times daily.     . Ascorbic Acid (VITAMIN C) 1000 MG tablet Take 1,000 mg by mouth daily.    [name] aspirin 81 MG tablet Take 81 mg by mouth every evening.     [name] CALCIUM CARBONATE PO Take 1 tablet by mouth daily.    . Cyanocobalamin (VITAMIN B 12 PO) Take 1 tablet by mouth daily.     . fluticasone (FLONASE) 50 MCG/ACT nasal spray Place 2 sprays into the nose daily as needed for allergies.     [name] HYDROcodone-acetaminophen (NORCO/VICODIN)  5-325 MG per tablet Take 1 tablet by mouth 2 (two) times daily.     [name] levothyroxine (SYNTHROID, LEVOTHROID) 112 MCG tablet Take 112 mcg by mouth as directed. Take Only on (Sun, Tues, Wed, Thurs, Sat)    . levothyroxine (SYNTHROID, LEVOTHROID) 125 MCG tablet Take 125 mcg by mouth as directed. Take Only [facility] & Fri)    . magnesium oxide (MAG-OX) 400 MG tablet Take 400 mg by mouth daily.    . metoprolol succinate (TOPROL-XL) 25 MG 24 hr tablet Take 12.5 mg by mouth 2 (two) times daily.     [name] omeprazole (PRILOSEC) 20 MG capsule Take 20 mg by mouth daily.     . pravastatin (PRAVACHOL) 40 MG tablet Take 40 mg by mouth at bedtime.    . sertraline (ZOLOFT) 50 MG tablet Take 50 mg by mouth daily.    [name] warfarin (COUMADIN) 5 MG tablet Take 5 mg daily except on Mondays: take 2.5 mg (1/2 tablet) (Patient taking differently: Take 1.5 tablets (7.5 mg) on (Mon & Fri) & Take 1 tablet (5 mg) all other days) 90 tablet 3   No current facility-administered medications for this visit.    Allergies:   Bee venom, Hornet venom, Reglan [metoclopramide], Vitamin k, Baclofen, Benadryl [diphenhydramine hcl], Ciprofloxacin, Crestor [rosuvastatin calcium], Cymbalta [duloxetine hcl], Daypro [oxaprozin], Doxycycline, Levofloxacin, Sulfonamide derivatives, and Zocor [simvastatin]    ROS:  Please see the history of present illness.   Otherwise, review of systems are positive for none.   All other systems are reviewed and negative.    PHYSICAL EXAM: VS:  BP 134/72   Pulse (!) 59   Ht 5\' 5"  (1.651 m)   Wt 168 lb 12.8 oz (76.6 kg)   SpO2 99%   BMI 28.09 kg/m  , BMI Body mass index is 28.09 kg/m. GENERAL:  Well appearing NECK:  No jugular venous distention, waveform within normal limits, carotid upstroke brisk and symmetric, no bruits, no thyromegaly LUNGS:  Clear to auscultation bilaterally CHEST:  Unremarkable HEART:  PMI not displaced or sustained,S1 and S2 within normal limits, no S3, no S4, no clicks, no rubs, no  murmurs ABD:  Flat, positive bowel sounds normal in frequency in pitch, no bruits, no rebound, no guarding, no midline pulsatile mass, no hepatomegaly, no splenomegaly EXT:  2 plus pulses throughout, no edema, no cyanosis no clubbing   EKG:  EKG is  ordered today. The ekg ordered today demonstrates sinus rhythm, rate 59, axis  within normal limits, intervals within normal limits, no acute ST-T wave changes.   Recent Labs: 01/21/2020: ALT 18; BUN 16; Creatinine, Ser 1.27; Hemoglobin 13.4; Platelets 220; Potassium 4.7; Sodium 138    Lipid Panel    Component Value Date/Time   CHOL 204 (H) 04/14/2016 0525   TRIG 231 (H) 04/14/2016 0525   HDL 40 (L) 04/14/2016 0525   CHOLHDL 5.1 04/14/2016 0525   VLDL 46 (H) 04/14/2016 0525   LDLCALC 118 (H) 04/14/2016 0525      Wt Readings from Last 3 Encounters:  05/01/20 168 lb 12.8 oz (76.6 kg)  04/10/20 162 lb (73.5 kg)  01/21/20 162 lb (73.5 kg)      Other studies Reviewed: Additional studies/ records that were reviewed today include: Labs. Review of the above records demonstrates:  Please see elsewhere in the note.     ASSESSMENT AND PLAN:  CHEST PAIN:   Chest pain is somewhat atypical.  However, given her risk factors I will bring her back for a POET (Plain Old Exercise Treadmill)   LEG PAIN: She has normal pulses.  She is already on blood thinners to treat against DVT and I would not suspect this based on absence of findings.  This may be musculoskeletal pain and I will refer her back to her primary provider.    DYSLIPIDEMIA: Her LDL recently was 151.  Her primary provider increased her Pravachol and I will defer to their management.  Current medicines are reviewed at length with the patient today.  The patient does not have concerns regarding medicines.  The following changes have been made:  None  Labs/ tests ordered today include: None  Orders Placed This Encounter  Procedures  . EXERCISE TOLERANCE TEST (ETT)  . EKG 12-Lead       Disposition:   FU with me as needed.     Signed, Minus Breeding, MD  05/01/2020 5:16 PM    [place]

## 2020-05-01 ENCOUNTER — Ambulatory Visit (INDEPENDENT_AMBULATORY_CARE_PROVIDER_SITE_OTHER): Payer: Medicare HMO | Admitting: Cardiology

## 2020-05-01 ENCOUNTER — Encounter: Payer: Self-pay | Admitting: Cardiology

## 2020-05-01 ENCOUNTER — Other Ambulatory Visit: Payer: Self-pay

## 2020-05-01 VITALS — BP 134/72 | HR 59 | Ht 65.0 in | Wt 168.8 lb

## 2020-05-01 DIAGNOSIS — R079 Chest pain, unspecified: Secondary | ICD-10-CM

## 2020-05-01 DIAGNOSIS — M79605 Pain in left leg: Secondary | ICD-10-CM | POA: Diagnosis not present

## 2020-05-01 NOTE — Patient Instructions (Signed)
Medication Instructions:  Your physician recommends that you continue on your current medications as directed. Please refer to the Current Medication list given to you today.  *If you need a refill on your cardiac medications before your next appointment, please call your pharmacy*  Lab Work: NONE ordered at this time of appointment   If you have labs (blood work) drawn today and your tests are completely normal, you will receive your results only by:  [place] (if you have MyChart) OR  A paper copy in the mail If you have any lab test that is abnormal or we need to change your treatment, we will call you to review the results.  Testing/Procedures: Your physician has requested that you have an exercise tolerance test. For further information please visit [email or web address]. Please also follow instruction sheet, as given.  Please schedule for 1-2 weeks   Follow-Up: At [facility], you and your health needs are our priority.  As part of our continuing mission to provide you with exceptional heart care, we have created designated Provider Care Teams.  These Care Teams include your primary Cardiologist (physician) and Advanced Practice Providers (APPs -  Physician Assistants and Nurse Practitioners) who all work together to provide you with the care you need, when you need it.  We recommend signing up for the patient portal called "MyChart".  Sign up information is provided on this After Visit Summary.  MyChart is used to connect with patients for Virtual Visits (Telemedicine).  Patients are able to view lab/test results, encounter notes, upcoming appointments, etc.  Non-urgent messages can be sent to your provider as well.   To learn more about what you can do with MyChart, go to [email or web address].    Your next appointment:   As Needed   The format for your next appointment:   Either in person or virtual   Provider:   Minus Breeding, MD   Other Instructions

## 2020-05-08 ENCOUNTER — Encounter: Payer: Self-pay | Admitting: Orthopedic Surgery

## 2020-05-08 ENCOUNTER — Ambulatory Visit: Payer: Self-pay

## 2020-05-08 ENCOUNTER — Ambulatory Visit (INDEPENDENT_AMBULATORY_CARE_PROVIDER_SITE_OTHER): Payer: Medicare HMO | Admitting: Physician Assistant

## 2020-05-08 VITALS — Ht 65.0 in | Wt 168.0 lb

## 2020-05-08 DIAGNOSIS — M25562 Pain in left knee: Secondary | ICD-10-CM | POA: Diagnosis not present

## 2020-05-08 DIAGNOSIS — M25561 Pain in right knee: Secondary | ICD-10-CM

## 2020-05-08 DIAGNOSIS — M25511 Pain in right shoulder: Secondary | ICD-10-CM

## 2020-05-08 DIAGNOSIS — G8929 Other chronic pain: Secondary | ICD-10-CM

## 2020-05-08 MED ORDER — METHYLPREDNISOLONE ACETATE 40 MG/ML IJ SUSP
40.0000 mg | INTRAMUSCULAR | Status: AC | PRN
  Administered 2020-05-08: 40 mg via INTRA_ARTICULAR

## 2020-05-08 MED ORDER — LIDOCAINE HCL 1 % IJ SOLN
5.0000 mL | INTRAMUSCULAR | Status: AC | PRN
  Administered 2020-05-08: 5 mL

## 2020-05-08 NOTE — Progress Notes (Signed)
Office Visit Note   Patient: [name]           Date of Birth: [birth year]           MRN: [id number] Visit Date: 05/08/2020              Requested by: [name]., PA-C [address]   [address] PCP: [name]., PA-C  Chief Complaint  Patient presents with  . Right Shoulder - Follow-up    S/p cortisone injection 04/10/20  . Left Knee - Pain  . Right Knee - Pain      HPI: The patient is a pleasant 74 year old woman who is here in follow-up for her right shoulder injection which she had 4 weeks ago.  She says the injection did not help her at all.  She has difficulties with forward elevation abduction and internal rotation.  At her previous exam she also had positive Neer's impingement sign.  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  She has had injections in the past and has done quite well.  Assessment & Plan: Visit Diagnoses:  1. Chronic pain of both knees     Plan: I will order an MRI of her right shoulder this will be reviewed by Dr. Sharol Given with her.  I have demonstrated some quadricep strengthening exercises that she can do at home to help with her patellofemoral issues.  We also will go forward with bilateral steroid injections into her knees today  Follow-Up Instructions: No follow-ups on file.   Ortho Exam  Patient is alert, oriented, no adenopathy, well-dressed, normal affect, normal respiratory effort. Right shoulder: No swelling she has difficulty with resisted abduction she can only get her arm abducted to about 70 degrees forward flexion at about 100 degrees she can only go to her beltline with internal rotation behind her back.  Positive impingement findings. Bilateral knees no effusions she does have some arthroscopy scars on the right knee.  No cellulitis she does have grinding with range of motion under the kneecap.   Some mild tenderness over both joint lines  Imaging: No results found. No images are attached to the encounter.  Labs: Lab Results  Component Value Date   HGBA1C 6.3 (H) 06/28/2017   HGBA1C 6.0 (H) 04/14/2016   ESRSEDRATE 5 06/28/2017   ESRSEDRATE 11 04/18/2016   ESRSEDRATE 11 11/04/2009   CRP 3.6 06/28/2017   CRP 1.3 (H) 04/18/2016   REPTSTATUS 04/20/2016 FINAL 04/16/2016   GRAMSTAIN  04/16/2016    CYTOSPIN SMEAR WBC PRESENT, PREDOMINANTLY MONONUCLEAR NO ORGANISMS SEEN    CULT NO GROWTH 3 DAYS 04/16/2016     Lab Results  Component Value Date   ALBUMIN 4.4 01/21/2020   ALBUMIN 4.6 09/19/2018   ALBUMIN 4.2 01/11/2018    Lab Results  Component Value Date   MG 2.5 (H) 01/11/2018   MG 2.2 10/07/2016   MG 2.2 04/15/2016   Lab Results  Component Value Date   VD25OH 41.5 06/28/2017    No results found for: PREALBUMIN CBC EXTENDED Latest Ref Rng & Units 01/21/2020 09/19/2018 02/13/2018  WBC 4.0 - 10.5 K/uL 7.1 7.3 11.6(H)  RBC 3.87 - 5.11 MIL/uL 4.36 4.44 3.68(L)  HGB 12.0 - 15.0 g/dL 13.4 13.3 11.3(L)  HCT 36 - 46 % 41.6 41.3 35.5(L)  PLT 150 - 400 K/uL 220 258 234  NEUTROABS  1 - 7 x10E3/uL - 4.1 -  LYMPHSABS 0 - 3 x10E3/uL - 2.3 -     Body mass index is 27.96 kg/m.  Orders:  Orders Placed This Encounter  Procedures  . XR Knee 1-2 Views Right  . XR Knee 1-2 Views Left   No orders of the defined types were placed in this encounter.    Procedures: Large Joint Inj: bilateral knee on 05/08/2020 1:51 PM Indications: pain and diagnostic evaluation Details: 22 G 1.5 in needle, anteromedial approach  Arthrogram: No  Medications (Right): 5 mL lidocaine 1 %; 40 mg methylPREDNISolone acetate 40 MG/ML Medications (Left): 5 mL lidocaine 1 %; 40 mg methylPREDNISolone acetate 40 MG/ML Outcome: tolerated well, no immediate complications Procedure, treatment alternatives, risks and benefits explained, specific risks discussed. Consent was given by the patient.       Clinical Data: No additional findings.  ROS:  All other systems negative, except as noted in the HPI. Review of Systems  Objective: Vital Signs: Ht 5\' 5"  (1.651 m)   Wt 168 lb (76.2 kg)   BMI 27.96 kg/m   Specialty Comments:  No specialty comments available.  PMFS History: Patient Active Problem List   Diagnosis Date Noted  . Bilateral primary osteoarthritis of knee 12/27/2017  . Acute infection of nasal sinus 07/21/2017  . Prediabetes 06/29/2017  . Soft tissue mass 06/14/2017  . Displaced fracture of fifth metatarsal bone of right foot 02/07/2017  . Unilateral primary osteoarthritis, left knee 11/26/2016  . Unilateral primary osteoarthritis, right knee 11/26/2016  . Left leg pain   . Abnormal MRI of head   . Vision changes 04/13/2016  . Orthostatic hypotension 03/10/2016  . Vertigo 03/10/2016  . Depression 11/25/2015  . Essential (primary) hypertension 11/25/2015  . Hyperlipidemia 11/25/2015  . Temporary cerebral vascular dysfunction 11/25/2015  . Primary malignant neoplasm ([place]) 11/25/2015  . Sleep apnea 11/25/2015  . Chronic anticoagulation 08/04/2015  . History of anticoagulant therapy 08/04/2015  . Pain in limb 12/26/2013  . Chest pain 11/27/2013  . GERD - post failed open Nissen 11/23/2012  . Dysphagia, unspecified(787.20) 09/28/2012  . Dysphagia 09/28/2012  . S/P Open Nissen 1998 Dr. Mendel Ryder 08/18/2012  . 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 Lupus anticoagulant positive 06/01/2011  . Chest pain   . Hx-TIA (transient ischemic attack)   . Hypothyroidism   . Breast cancer ([place])   . History of DVT (deep vein thrombosis)   . History of antiphospholipid antibody syndrome    Past Medical History:  Diagnosis Date  . Anxiety    severe panic attacks  . Arthritis   . Aseptic meningitis   . Asthma    as child  . Breast cancer ([place])    right  . Chronic anticoagulation  08/04/2015  . Collapsed lung    hx of, left  . Complication of anesthesia    "hard to put asleep"  . Depression   . Fibromyalgia   . GERD (gastroesophageal reflux disease)   . H/O hiatal hernia   . Headache(784.0)   . Hepatitis 1990   "from eating at restaurant"  . History of antiphospholipid antibody syndrome   . 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  . Hx-TIA (transient ischemic attack)   . Hyperlipidemia   . Hypertension   . Hypothyroidism   . Neuromuscular disorder ([place])   . Personal history of radiation therapy   . Pneumonia  hx of  . Soft tissue mass 06/14/2017   3 cm right post axilla same side as previous breast cancer 06/14/17  . Stroke ([place])   . 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 Alzheimer's disease Mother   . Heart disease Mother   . Hyperlipidemia Mother   . Hypertension Mother   . Cancer Maternal Grandmother        colon  . Heart disease Sister   . Hypertension Sister   . Heart attack Sister   . Heart disease Brother   . Heart attack Brother   . Cancer Daughter   . Hypertension Daughter   . Hypertension Son     Past Surgical History:  Procedure Laterality Date  . ABDOMINAL HYSTERECTOMY    . BREAST LUMPECTOMY Right 2000  . BREAST SURGERY Right    x3  . BUNIONECTOMY Bilateral 30 years ago  . CARDIAC CATHETERIZATION  07/12/1997   NORMAL LEFT VENTRICULAR FUNCTION WITH EF AT LEAST 70-75%  . ESOPHAGEAL MANOMETRY N/A 11/06/2012   Procedure: ESOPHAGEAL MANOMETRY (EM);  Surgeon: Pedro Earls, MD;  Location: [facility];  Service: General;  Laterality: N/A;  . ESOPHAGOGASTRODUODENOSCOPY ENDOSCOPY    . KNEE ARTHROSCOPY Right   . LAPAROSCOPIC NISSEN FUNDOPLICATION N/A 1/51/7616   Procedure: LAPAROSCOPIC TAKEDOWN  PERI-HIATAL HERNIA    REPAIR;  Surgeon: Pedro Earls, MD;  Location: [facility]  [facility];  Service: General;  Laterality: N/A;  . [place]  . RIGHT OOPHORECTOMY  1980  . THYROIDECTOMY  in 20's  . TONSILLECTOMY  74 years old  . UPPER GI ENDOSCOPY N/A 12/12/2012   Procedure: UPPER GI ENDOSCOPY;  Surgeon: Pedro Earls, MD;  Location: [facility];  Service: General;  Laterality: N/A;   Social History   Occupational History  . Not on file  Tobacco Use  . Smoking status: Never Smoker  . Smokeless tobacco: Never Used  Vaping Use  . Vaping Use: Never used  Substance and Sexual Activity  . Alcohol use: No    Alcohol/week: 0.0 standard drinks  . Drug use: No  . Sexual activity: Not on file

## 2020-05-13 ENCOUNTER — Other Ambulatory Visit (HOSPITAL_COMMUNITY)
Admission: RE | Admit: 2020-05-13 | Discharge: 2020-05-13 | Disposition: A | Discharge status: Home / Self Care | Payer: Medicare HMO | Source: Ambulatory Visit | Attending: Cardiology | Admitting: Cardiology

## 2020-05-13 DIAGNOSIS — Z01812 Encounter for preprocedural laboratory examination: Secondary | ICD-10-CM | POA: Insufficient documentation

## 2020-05-13 DIAGNOSIS — Z20822 Contact with and (suspected) exposure to covid-19: Secondary | ICD-10-CM | POA: Insufficient documentation

## 2020-05-13 LAB — SARS CORONAVIRUS 2 (TAT 6-24 HRS): SARS Coronavirus 2: NEGATIVE

## 2020-05-15 ENCOUNTER — Telehealth (HOSPITAL_COMMUNITY): Payer: Self-pay | Admitting: *Deleted

## 2020-05-15 NOTE — Telephone Encounter (Signed)
Close encounter 

## 2020-05-16 ENCOUNTER — Other Ambulatory Visit: Payer: Self-pay

## 2020-05-16 ENCOUNTER — Ambulatory Visit (HOSPITAL_COMMUNITY)
Admission: RE | Admit: 2020-05-16 | Discharge: 2020-05-16 | Disposition: A | Discharge status: Home / Self Care | Payer: Medicare HMO | Source: Ambulatory Visit | Attending: Cardiology | Admitting: Cardiology

## 2020-05-16 DIAGNOSIS — R079 Chest pain, unspecified: Secondary | ICD-10-CM | POA: Diagnosis not present

## 2020-05-16 LAB — EXERCISE TOLERANCE TEST
Estimated workload: 5.8 METS
Exercise duration (min): 4 min
Exercise duration (sec): 1 s
MPHR: 148 {beats}/min
Peak HR: 157 {beats}/min
Percent HR: 106 %
Rest HR: 103 {beats}/min

## 2020-05-28 ENCOUNTER — Ambulatory Visit
Admission: RE | Admit: 2020-05-28 | Discharge: 2020-05-28 | Disposition: A | Discharge status: Home / Self Care | Payer: Medicare HMO | Source: Ambulatory Visit | Attending: Physician Assistant | Admitting: Physician Assistant

## 2020-05-28 DIAGNOSIS — M25511 Pain in right shoulder: Secondary | ICD-10-CM

## 2020-06-02 ENCOUNTER — Encounter: Payer: Self-pay | Admitting: Orthopedic Surgery

## 2020-06-02 ENCOUNTER — Ambulatory Visit (INDEPENDENT_AMBULATORY_CARE_PROVIDER_SITE_OTHER): Payer: Medicare HMO | Admitting: Orthopedic Surgery

## 2020-06-02 VITALS — Ht 65.0 in | Wt 168.0 lb

## 2020-06-02 DIAGNOSIS — M75111 Incomplete rotator cuff tear or rupture of right shoulder, not specified as traumatic: Secondary | ICD-10-CM

## 2020-06-02 NOTE — Progress Notes (Signed)
Office Visit Note   Patient: [name]           Date of Birth: [birth year]           MRN: [id number] Visit Date: 06/02/2020              Requested by: [name]., PA-C [address]  [place] [address] PCP: [name]., PA-C  Chief Complaint  Patient presents with   Right Shoulder - Follow-up, Pain    MRI review right shoulder      HPI: Patient is a 74 year old woman who presents in follow-up status post MRI scan of her right shoulder.  Patient states she cannot raise her arm over her head cannot reach behind her back.  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   Assessment & Plan: Visit Diagnoses:  1. Nontraumatic incomplete tear of right rotator cuff     Plan: Discussed treatment options including arthroscopic intervention with post arthroscopy therapy.  Risks and benefits were discussed patient states she understands wished to proceed with arthroscopic intervention we will set this up at her convenience.  Follow-Up Instructions: Return if symptoms worsen or fail to improve.   Ortho Exam  Patient is alert, oriented, no adenopathy, well-dressed, normal affect, normal respiratory effort. Examination patient has abduction and flexion of 45 degrees passively she has abduction and flexion to 70 degrees she has internal and external rotation of 45 degrees in all range of motion is painful.  She has pain with Neer and Hawkins impingement test pain with a drop arm test.  Review of the MRI scan tendinosis with partial-thickness tearing of the supraspinatus as well as arthritis of the [facility] joint and subacromial bursitis.  Imaging: No results found. No images are attached to the encounter.  Labs: Lab Results  Component Value Date   HGBA1C 6.3 (H) 06/28/2017   HGBA1C 6.0 (H) 04/14/2016   ESRSEDRATE 5 06/28/2017   ESRSEDRATE 11 04/18/2016   ESRSEDRATE 11 11/04/2009   CRP 3.6 06/28/2017   CRP 1.3 (H)  04/18/2016   REPTSTATUS 04/20/2016 FINAL 04/16/2016   GRAMSTAIN  04/16/2016    CYTOSPIN SMEAR WBC PRESENT, PREDOMINANTLY MONONUCLEAR NO ORGANISMS SEEN    CULT NO GROWTH 3 DAYS 04/16/2016     Lab Results  Component Value Date   ALBUMIN 4.4 01/21/2020   ALBUMIN 4.6 09/19/2018   ALBUMIN 4.2 01/11/2018    Lab Results  Component Value Date   MG 2.5 (H) 01/11/2018   MG 2.2 10/07/2016   MG 2.2 04/15/2016   Lab Results  Component Value Date   VD25OH 41.5 06/28/2017    No results found for: PREALBUMIN CBC EXTENDED Latest Ref Rng & Units 01/21/2020 09/19/2018 02/13/2018  WBC 4.0 - 10.5 K/uL 7.1 7.3 11.6(H)  RBC 3.87 - 5.11 MIL/uL 4.36 4.44 3.68(L)  HGB 12.0 - 15.0 g/dL 13.4 13.3 11.3(L)  HCT 36 - 46 % 41.6 41.3 35.5(L)  PLT 150 - 400 K/uL 220 258 234  NEUTROABS 1.40 - 7.00 x10E3/uL - 4.1 -  LYMPHSABS 0 - 3 x10E3/uL - 2.3 -     Body mass index is 27.96 kg/m.  Orders:  No orders of the defined types were placed in this encounter.  No orders of the defined types were placed in this encounter.    Procedures: No procedures performed  Clinical Data: No additional findings.  ROS:  All other systems negative, except as noted in the HPI.  Review of Systems  Objective: Vital Signs: Ht 5\' 5"  (1.651 m)    Wt 168 lb (76.2 kg)    BMI 27.96 kg/m   Specialty Comments:  No specialty comments available.  PMFS History: Patient Active Problem List   Diagnosis Date Noted   Bilateral primary osteoarthritis of knee 12/27/2017   Acute infection of nasal sinus 07/21/2017   Prediabetes 06/29/2017   Soft tissue mass 06/14/2017   Displaced fracture of fifth metatarsal bone of right foot 02/07/2017   Unilateral primary osteoarthritis, left knee 11/26/2016   Unilateral primary osteoarthritis, right knee 11/26/2016   Left leg pain    Abnormal MRI of head    Vision changes 04/13/2016   Orthostatic hypotension 03/10/2016   Vertigo 03/10/2016   Depression 11/25/2015     Essential (primary) hypertension 11/25/2015   Hyperlipidemia 11/25/2015   Temporary cerebral vascular dysfunction 11/25/2015   Primary malignant neoplasm ([place]) 11/25/2015   Sleep apnea 11/25/2015   Chronic anticoagulation 08/04/2015   History of anticoagulant therapy 08/04/2015   Pain in limb 12/26/2013   Chest pain 11/27/2013   GERD - post failed open Nissen 11/23/2012   Dysphagia, unspecified(787.20) 09/28/2012   Dysphagia 09/28/2012   S/P Open Nissen 1998 Dr. 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   Lupus anticoagulant positive 06/01/2011   Chest pain    Hx-TIA (transient ischemic attack)    Hypothyroidism    Breast cancer ([place])    History of DVT (deep vein thrombosis)    History of antiphospholipid antibody syndrome    Past Medical History:  Diagnosis Date   Anxiety    severe panic attacks   Arthritis    Aseptic meningitis    Asthma    as child   Breast cancer ([place])    right   Chronic anticoagulation 08/04/2015   Collapsed lung    hx of, left   Complication of anesthesia    "hard to put asleep"   Depression    Fibromyalgia    GERD (gastroesophageal reflux disease)    H/O hiatal hernia    Headache(784.0)    Hepatitis 1990   "from eating at restaurant"   History of antiphospholipid antibody syndrome    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   Hx-TIA (transient ischemic attack)    Hyperlipidemia    Hypertension    Hypothyroidism    Neuromuscular disorder ([place])    Personal history of radiation therapy    Pneumonia    hx of   Soft tissue mass 06/14/2017   3 cm right post axilla same side as previous breast cancer 06/14/17   Stroke ([place])    Thrombosis, 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   Left digital artery  October 1998 - new dx antiphospholipid antibody syndrome    Family History  Problem Relation  Age of Onset   Alzheimer's disease Mother    Heart disease Mother    Hyperlipidemia Mother    Hypertension Mother    Cancer Maternal Grandmother        colon   Heart disease Sister    Hypertension Sister    Heart attack Sister    Heart disease Brother    Heart attack Brother    Cancer Daughter    Hypertension Daughter    Hypertension Son     Past Surgical History:  Procedure Laterality Date   ABDOMINAL HYSTERECTOMY     BREAST LUMPECTOMY Right  2000   BREAST SURGERY Right    x3   BUNIONECTOMY Bilateral 30 years ago   CARDIAC CATHETERIZATION  07/12/1997   NORMAL LEFT VENTRICULAR FUNCTION WITH EF AT LEAST 70-75%   ESOPHAGEAL MANOMETRY N/A 11/06/2012   Procedure: ESOPHAGEAL MANOMETRY (EM);  Surgeon: Pedro Earls, MD;  Location: [facility];  Service: General;  Laterality: N/A;   ESOPHAGOGASTRODUODENOSCOPY ENDOSCOPY     KNEE ARTHROSCOPY Right    LAPAROSCOPIC NISSEN FUNDOPLICATION N/A 2/70/3500   Procedure: LAPAROSCOPIC TAKEDOWN  PERI-HIATAL HERNIA    REPAIR;  Surgeon: Pedro Earls, MD;  Location: [facility];  Service: General;  Laterality: N/A;   [place]  in 20's   TONSILLECTOMY  74 years old   UPPER GI ENDOSCOPY N/A 12/12/2012   Procedure: UPPER GI ENDOSCOPY;  Surgeon: Pedro Earls, MD;  Location: [facility];  Service: General;  Laterality: N/A;   Social History   Occupational History   Not on file  Tobacco Use   Smoking status: Never Smoker   Smokeless tobacco: Never Used  Vaping Use   Vaping Use: Never used  Substance and Sexual Activity   Alcohol use: No    Alcohol/week: 0.0 standard drinks   Drug use: No   Sexual activity: Not on file

## 2020-07-09 DIAGNOSIS — Z853 Personal history of malignant neoplasm of breast: Secondary | ICD-10-CM | POA: Insufficient documentation

## 2020-07-30 ENCOUNTER — Other Ambulatory Visit: Payer: Self-pay

## 2020-09-02 ENCOUNTER — Ambulatory Visit (HOSPITAL_BASED_OUTPATIENT_CLINIC_OR_DEPARTMENT_OTHER): Admit: 2020-09-02 | Payer: Medicare HMO | Admitting: Orthopedic Surgery

## 2020-09-02 ENCOUNTER — Encounter (HOSPITAL_BASED_OUTPATIENT_CLINIC_OR_DEPARTMENT_OTHER): Payer: Self-pay

## 2020-09-02 SURGERY — ARTHROSCOPY, SHOULDER
Anesthesia: Choice | Site: Shoulder | Laterality: Right

## 2020-10-24 ENCOUNTER — Emergency Department (HOSPITAL_BASED_OUTPATIENT_CLINIC_OR_DEPARTMENT_OTHER)
Admission: EM | Admit: 2020-10-24 | Discharge: 2020-10-25 | Disposition: A | Discharge status: Home / Self Care | Payer: Medicare HMO | Attending: Emergency Medicine | Admitting: Emergency Medicine

## 2020-10-24 ENCOUNTER — Emergency Department (HOSPITAL_BASED_OUTPATIENT_CLINIC_OR_DEPARTMENT_OTHER): Payer: Medicare HMO

## 2020-10-24 ENCOUNTER — Other Ambulatory Visit: Payer: Self-pay

## 2020-10-24 ENCOUNTER — Encounter (HOSPITAL_BASED_OUTPATIENT_CLINIC_OR_DEPARTMENT_OTHER): Payer: Self-pay | Admitting: *Deleted

## 2020-10-24 DIAGNOSIS — E871 Hypo-osmolality and hyponatremia: Secondary | ICD-10-CM | POA: Insufficient documentation

## 2020-10-24 DIAGNOSIS — R1110 Vomiting, unspecified: Secondary | ICD-10-CM

## 2020-10-24 DIAGNOSIS — Z79899 Other long term (current) drug therapy: Secondary | ICD-10-CM | POA: Insufficient documentation

## 2020-10-24 DIAGNOSIS — J45909 Unspecified asthma, uncomplicated: Secondary | ICD-10-CM | POA: Insufficient documentation

## 2020-10-24 DIAGNOSIS — Z7982 Long term (current) use of aspirin: Secondary | ICD-10-CM | POA: Insufficient documentation

## 2020-10-24 DIAGNOSIS — I10 Essential (primary) hypertension: Secondary | ICD-10-CM | POA: Diagnosis not present

## 2020-10-24 DIAGNOSIS — R197 Diarrhea, unspecified: Secondary | ICD-10-CM | POA: Insufficient documentation

## 2020-10-24 DIAGNOSIS — Z853 Personal history of malignant neoplasm of breast: Secondary | ICD-10-CM | POA: Insufficient documentation

## 2020-10-24 DIAGNOSIS — Z7901 Long term (current) use of anticoagulants: Secondary | ICD-10-CM | POA: Diagnosis not present

## 2020-10-24 DIAGNOSIS — E860 Dehydration: Secondary | ICD-10-CM | POA: Insufficient documentation

## 2020-10-24 DIAGNOSIS — E039 Hypothyroidism, unspecified: Secondary | ICD-10-CM | POA: Diagnosis not present

## 2020-10-24 LAB — CBC WITH DIFFERENTIAL/PLATELET
Abs Immature Granulocytes: 0.03 10*3/uL (ref 0.00–0.07)
Basophils Absolute: 0 10*3/uL (ref 0.0–0.1)
Basophils Relative: 0 %
Eosinophils Absolute: 0.1 10*3/uL (ref 0.0–0.5)
Eosinophils Relative: 1 %
HCT: 40.9 % (ref 36.0–46.0)
Hemoglobin: 13.8 g/dL (ref 12.0–15.0)
Immature Granulocytes: 0 %
Lymphocytes Relative: 9 %
Lymphs Abs: 0.7 10*3/uL (ref 0.7–4.0)
MCH: 30.9 pg (ref 26.0–34.0)
MCHC: 33.7 g/dL (ref 30.0–36.0)
MCV: 91.5 fL (ref 80.0–100.0)
Monocytes Absolute: 0.5 10*3/uL (ref 0.1–1.0)
Monocytes Relative: 6 %
Neutro Abs: 6.3 10*3/uL (ref 1.7–7.7)
Neutrophils Relative %: 84 %
Platelets: 201 10*3/uL (ref 150–400)
RBC: 4.47 MIL/uL (ref 3.87–5.11)
RDW: 13.9 % (ref 11.5–15.5)
WBC: 7.5 10*3/uL (ref 4.0–10.5)
nRBC: 0 % (ref 0.0–0.2)

## 2020-10-24 LAB — URINALYSIS, ROUTINE W REFLEX MICROSCOPIC
Bilirubin Urine: NEGATIVE
Glucose, UA: NEGATIVE mg/dL
Ketones, ur: NEGATIVE mg/dL
Leukocytes,Ua: NEGATIVE
Nitrite: NEGATIVE
Protein, ur: NEGATIVE mg/dL
Specific Gravity, Urine: 1.02 (ref 1.005–1.030)
pH: 5 (ref 5.0–8.0)

## 2020-10-24 LAB — COMPREHENSIVE METABOLIC PANEL
ALT: 32 U/L (ref 0–44)
AST: 45 U/L — ABNORMAL HIGH (ref 15–41)
Albumin: 4.4 g/dL (ref 3.5–5.0)
Alkaline Phosphatase: 76 U/L (ref 38–126)
Anion gap: 11 (ref 5–15)
BUN: 20 mg/dL (ref 8–23)
CO2: 20 mmol/L — ABNORMAL LOW (ref 22–32)
Calcium: 8.5 mg/dL — ABNORMAL LOW (ref 8.9–10.3)
Chloride: 97 mmol/L — ABNORMAL LOW (ref 98–111)
Creatinine, Ser: 1.19 mg/dL — ABNORMAL HIGH (ref 0.44–1.00)
GFR, Estimated: 48 mL/min — ABNORMAL LOW (ref >60)
Glucose, Bld: 108 mg/dL — ABNORMAL HIGH (ref 70–99)
Potassium: 4.1 mmol/L (ref 3.5–5.1)
Sodium: 128 mmol/L — ABNORMAL LOW (ref 135–145)
Total Bilirubin: 0.8 mg/dL (ref 0.3–1.2)
Total Protein: 7.9 g/dL (ref 6.5–8.1)

## 2020-10-24 LAB — URINALYSIS, MICROSCOPIC (REFLEX)
Bacteria, UA: NONE SEEN
WBC, UA: NONE SEEN WBC/hpf (ref 0–5)

## 2020-10-24 LAB — LIPASE, BLOOD: Lipase: 42 U/L (ref 11–51)

## 2020-10-24 MED ORDER — SODIUM CHLORIDE 0.9 % IV BOLUS
1000.0000 mL | Freq: Once | INTRAVENOUS | Status: AC
  Administered 2020-10-24: 1000 mL via INTRAVENOUS

## 2020-10-24 MED ORDER — ONDANSETRON HCL 4 MG/2ML IJ SOLN
4.0000 mg | Freq: Once | INTRAMUSCULAR | Status: AC
  Administered 2020-10-24: 4 mg via INTRAVENOUS
  Filled 2020-10-24: qty 2

## 2020-10-24 MED ORDER — SODIUM CHLORIDE 0.9 % IV SOLN: INTRAVENOUS | Status: DC

## 2020-10-24 MED ORDER — LOPERAMIDE HCL 2 MG PO CAPS
4.0000 mg | ORAL_CAPSULE | Freq: Once | ORAL | Status: AC
  Administered 2020-10-24: 4 mg via ORAL
  Filled 2020-10-24: qty 2

## 2020-10-24 NOTE — ED Triage Notes (Signed)
Diarrhea since yesterday and vomiting today. States she fell this am and hurt her back. She takes a blood thinner.

## 2020-10-24 NOTE — ED Provider Notes (Signed)
[facility] EMERGENCY DEPARTMENT Provider Note   CSN: [id number] Arrival date & time: 10/24/20  2050     History Chief complaint: Vomiting diarrhea  [name] is a 75 y.o. female.  HPI   Patient presents to the ED for evaluation of vomiting and diarrhea.  Patient states symptoms started yesterday.  They have persisted throughout the day.  She has had multiple episodes of diarrhea as well as several episodes of vomiting.  Patient has not been able to keep much down.  She has tried taking over-the-counter medications for diarrhea but it has not helped.  Patient has felt increasingly weak and she had an episode of falling after trying to stand up.  She does have some pain in her back.  She did not hit her head or lose consciousness.  She has not noticed any blood in her stool or emesis.  Patient's having some abdominal cramping but is not having severe pain in her abdomen.  She denies any recent antibiotics.  No known ill contacts.  Past Medical History:  Diagnosis Date  . Anxiety    severe panic attacks  . Arthritis   . Aseptic meningitis   . Asthma    as child  . Breast cancer ([place])    right  . Chronic anticoagulation 08/04/2015  . Collapsed lung    hx of, left  . Complication of anesthesia    "hard to put asleep"  . Depression   . Fibromyalgia   . GERD (gastroesophageal reflux disease)   . H/O hiatal hernia   . Headache(784.0)   . Hepatitis 1990   "from eating at restaurant"  . History of antiphospholipid antibody syndrome   . 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  . Hx-TIA (transient ischemic attack)   . Hyperlipidemia   . Hypertension   . Hypothyroidism   . Neuromuscular disorder ([place])   . Personal history of radiation therapy   . Pneumonia    hx of  . Soft tissue mass 06/14/2017   3 cm right post axilla same side as previous breast cancer 06/14/17  . Stroke ([place])   . 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 Bilateral primary osteoarthritis of knee 12/27/2017  . Acute infection of nasal sinus 07/21/2017  . Prediabetes 06/29/2017  . Soft tissue mass 06/14/2017  . Displaced fracture of fifth metatarsal bone of right foot 02/07/2017  . Unilateral primary osteoarthritis, left knee 11/26/2016  . Unilateral primary osteoarthritis, right knee 11/26/2016  . Left leg pain   . Abnormal MRI of head   . Vision changes 04/13/2016  . Orthostatic hypotension 03/10/2016  . Vertigo 03/10/2016  . Depression 11/25/2015  . Essential (primary) hypertension 11/25/2015  . Hyperlipidemia 11/25/2015  . Temporary cerebral vascular dysfunction 11/25/2015  . Primary malignant neoplasm ([place]) 11/25/2015  . Sleep apnea 11/25/2015  . Chronic anticoagulation 08/04/2015  . History of anticoagulant therapy 08/04/2015  . Pain in limb 12/26/2013  . Chest pain 11/27/2013  . GERD - post failed open Nissen 11/23/2012  . Dysphagia, unspecified(787.20) 09/28/2012  . Dysphagia 09/28/2012  . S/P Open Nissen 1998 Dr. Mendel Ryder 08/18/2012  . 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 Antiphospholipid antibody syndrome ([place]) 06/01/2011  . Lupus anticoagulant positive 06/01/2011  . Chest pain   . Hx-TIA (transient ischemic  attack)   . Hypothyroidism   . Breast cancer ([place])   . History of DVT (deep vein thrombosis)   . History of antiphospholipid antibody syndrome     Past Surgical History:  Procedure Laterality Date  . ABDOMINAL HYSTERECTOMY    . BREAST LUMPECTOMY Right 2000  . BREAST SURGERY Right    x3  . BUNIONECTOMY Bilateral 30 years ago  . CARDIAC CATHETERIZATION  07/12/1997   NORMAL LEFT VENTRICULAR FUNCTION WITH EF AT LEAST 70-75%  . ESOPHAGEAL MANOMETRY N/A 11/06/2012   Procedure: ESOPHAGEAL MANOMETRY (EM);  Surgeon: Pedro Earls, MD;  Location: [facility];   Service: General;  Laterality: N/A;  . ESOPHAGOGASTRODUODENOSCOPY ENDOSCOPY    . KNEE ARTHROSCOPY Right   . LAPAROSCOPIC NISSEN FUNDOPLICATION N/A 08/03/3233   Procedure: LAPAROSCOPIC TAKEDOWN  PERI-HIATAL HERNIA    REPAIR;  Surgeon: Pedro Earls, MD;  Location: [facility] ORS;  Service: General;  Laterality: N/A;  . [place]  . RIGHT OOPHORECTOMY  1980  . THYROIDECTOMY  in 20's  . TONSILLECTOMY  75 years old  . UPPER GI ENDOSCOPY N/A 12/12/2012   Procedure: UPPER GI ENDOSCOPY;  Surgeon: Pedro Earls, MD;  Location: [facility] ORS;  Service: General;  Laterality: N/A;     OB History   No obstetric history on file.     Family History  Problem Relation Age of Onset  . Alzheimer's disease Mother   . Heart disease Mother   . Hyperlipidemia Mother   . Hypertension Mother   . Cancer Maternal Grandmother        colon  . Heart disease Sister   . Hypertension Sister   . Heart attack Sister   . Heart disease Brother   . Heart attack Brother   . Cancer Daughter   . Hypertension Daughter   . Hypertension Son     Social History   Tobacco Use  . Smoking status: Never Smoker  . Smokeless tobacco: Never Used  Vaping Use  . Vaping Use: Never used  Substance Use Topics  . Alcohol use: No    Alcohol/week: 0.0 standard drinks  . Drug use: No    Home Medications Prior to Admission medications   Medication Sig Start Date End Date Taking? Authorizing Provider  ALPRAZolam [name]) 1 MG tablet Take 1 mg by mouth 2 (two) times daily.  12/22/19  Yes [provider]  Ascorbic Acid (VITAMIN C) 1000 MG tablet Take 1,000 mg by mouth daily.   Yes [provider]  aspirin 81 MG tablet Take 81 mg by mouth every evening.   Yes [provider]  CALCIUM CARBONATE PO Take 1 tablet by mouth daily.   Yes [provider]  Cyanocobalamin (VITAMIN B 12 PO) Take 1 tablet by mouth daily.    Yes [provider]  HYDROcodone-acetaminophen (NORCO/VICODIN)  5-325 MG per tablet Take 1 tablet by mouth 2 (two) times daily.   Yes [provider]  levothyroxine (SYNTHROID, LEVOTHROID) 112 MCG tablet Take 112 mcg by mouth as directed. Take Only on (Sun, Tues, Wed, Thurs, Sat)   Yes [provider]  levothyroxine (SYNTHROID, LEVOTHROID) 125 MCG tablet Take 125 mcg by mouth as directed. Take Only [place] Knock & Fri) 06/24/16  Yes [provider]  magnesium oxide (MAG-OX) 400 MG tablet Take 400 mg by mouth daily.   Yes [provider]  metoprolol succinate (TOPROL-XL) 25 MG 24 hr tablet Take 12.5 mg by mouth 2 (two) times daily.   Yes  [provider]  omeprazole (PRILOSEC) 20 MG capsule Take 20 mg by mouth daily.  02/06/13  Yes [provider]  pravastatin (PRAVACHOL) 40 MG tablet Take 40 mg by mouth at bedtime. 01/15/20  Yes [provider]  sertraline (ZOLOFT) 50 MG tablet Take 50 mg by mouth daily. 01/20/20  Yes [provider]  warfarin (COUMADIN) 5 MG tablet Take 5 mg daily except on Mondays: take 2.5 mg (1/2 tablet) Patient taking differently: Take 1.5 tablets (7.5 mg) on (Mon & Fri) & Take 1 tablet (5 mg) all other days 05/11/18  Yes Annia Belt, MD  fluticasone (FLONASE) 50 MCG/ACT nasal spray Place 2 sprays into the nose daily as needed for allergies.  10/16/14   [provider]    Allergies    Bee venom, Hornet venom, Reglan [metoclopramide], Vitamin k, Baclofen, Benadryl [diphenhydramine hcl], Ciprofloxacin, Crestor [rosuvastatin calcium], Cymbalta [duloxetine hcl], Daypro [oxaprozin], Doxycycline, Levofloxacin, Sulfonamide derivatives, and Zocor [simvastatin]  Review of Systems   Review of Systems  All other systems reviewed and are negative.   Physical Exam Updated Vital Signs BP 129/63 (BP Location: Left Arm)   Pulse 80   Temp 98.3 F (36.8 C) (Oral)   Resp 20   Ht 1.664 m (5' 5.5")   Wt 76 kg   SpO2 95%   BMI 27.45 kg/m   Physical Exam Vitals and  nursing note reviewed.  Constitutional:      Appearance: She is well-developed. She is ill-appearing.  HENT:     Head: Normocephalic and atraumatic.     Right Ear: External ear normal.     Left Ear: External ear normal.  Eyes:     General: No scleral icterus.       Right eye: No discharge.        Left eye: No discharge.     Conjunctiva/sclera: Conjunctivae normal.  Neck:     Trachea: No tracheal deviation.  Cardiovascular:     Rate and Rhythm: Normal rate and regular rhythm.  Pulmonary:     Effort: Pulmonary effort is normal. No respiratory distress.     Breath sounds: Normal breath sounds. No stridor. No wheezing or rales.  Abdominal:     General: Bowel sounds are normal. There is no distension.     Palpations: Abdomen is soft.     Tenderness: There is no abdominal tenderness. There is no guarding or rebound.  Musculoskeletal:        General: No tenderness.     Cervical back: Neck supple.     Comments: Tenderness palpation thoracic abdomen lumbar spine  Skin:    General: Skin is warm and dry.     Coloration: Skin is pale.     Findings: No rash.  Neurological:     Mental Status: She is alert.     Cranial Nerves: No cranial nerve deficit (no facial droop, extraocular movements intact, no slurred speech).     Sensory: No sensory deficit.     Motor: No abnormal muscle tone or seizure activity.     Coordination: Coordination normal.     ED Results / Procedures / Treatments   Labs (all labs ordered are listed, but only abnormal results are displayed) Labs Reviewed  COMPREHENSIVE METABOLIC PANEL - Abnormal; Notable for the following components:      Result Value   Sodium 128 (*)    Chloride 97 (*)    CO2 20 (*)    Glucose, Bld 108 (*)    Creatinine,  Ser 1.19 (*)    Calcium 8.5 (*)    AST 45 (*)    GFR, Estimated 48 (*)    All other components within normal limits  URINALYSIS, ROUTINE W REFLEX MICROSCOPIC - Abnormal; Notable for the following components:   Hgb urine  dipstick MODERATE (*)    All other components within normal limits  LIPASE, BLOOD  CBC WITH DIFFERENTIAL/PLATELET  URINALYSIS, MICROSCOPIC (REFLEX)    EKG None  Radiology DG Chest 2 View  Result Date: 10/24/2020 CLINICAL DATA:  Fall, upper and mid back pain EXAM: CHEST - 2 VIEW COMPARISON:  01/14/2018 FINDINGS: Heart and mediastinal contours are within normal limits. No focal opacities or effusions. No acute bony abnormality. Aortic atherosclerosis. IMPRESSION: No active cardiopulmonary disease. Electronically Signed   By: Rolm Baptise M.D.   On: 10/24/2020 22:02   DG Thoracic Spine 2 View  Result Date: 10/24/2020 CLINICAL DATA:  Fall, pain EXAM: THORACIC SPINE 2 VIEWS COMPARISON:  01/04/2018 FINDINGS: Early degenerative spurring. No fracture or focal bone lesion. No malalignment. IMPRESSION: No acute bony abnormality. Electronically Signed   By: Rolm Baptise M.D.   On: 10/24/2020 22:00   DG Lumbar Spine Complete  Result Date: 10/24/2020 CLINICAL DATA:  Fall, back pain EXAM: LUMBAR SPINE - COMPLETE 4+ VIEW COMPARISON:  None. FINDINGS: Degenerative facet disease diffusely, most pronounced in the lower lumbar spine. Disc spaces are maintained. Early anterior degenerative spurring. No fracture or malalignment. SI joints symmetric and unremarkable. IMPRESSION: Degenerative disc and facet disease as above. No acute bony abnormality. Electronically Signed   By: Rolm Baptise M.D.   On: 10/24/2020 21:59    Procedures Procedures   Medications Ordered in ED Medications  sodium chloride 0.9 % bolus 1,000 mL (1,000 mLs Intravenous New Bag/Given 10/24/20 2227)    And  0.9 %  sodium chloride infusion (has no administration in time range)  ondansetron (ZOFRAN) injection 4 mg (has no administration in time range)  sodium chloride 0.9 % bolus 1,000 mL (has no administration in time range)  loperamide (IMODIUM) capsule 4 mg (4 mg Oral Given 10/24/20 2228)  ondansetron (ZOFRAN) injection 4 mg (4 mg  Intravenous Given 10/24/20 2227)    ED Course  I have reviewed the triage vital signs and the nursing notes.  Pertinent labs & imaging results that were available during my care of the patient were reviewed by me and considered in my medical decision making (see chart for details).  Clinical Course as of 10/24/20 2315  Fri Oct 24, 2020  2235 CBC unremarkable. [JK]  2249 Plain films without signs of acute fracture [JK]  4431 Metabolic panel does show hyponatremia as well as slight increase in creatinine [JK]  2254 Patient states she still feeling nauseated but has not vomited since she has been here.  She has received about 400 cc of IV fluids.  We will continue to hydrate and see if she is able to tolerate some fluids later. [JK]    Clinical Course User Index [JK] Dorie Rank, MD   MDM Rules/Calculators/A&P                         Patient presented with vomiting and diarrhea.  She does not have any abdominal tenderness and her white blood cell count is normal.  No known ill contacts.  She did feel weak and had a fall related to her dehydration.  X-rays do not show any signs of any fracture.  Patient's laboratory  test do show signs of dehydration and hyponatremia.  Patient has been receiving IV fluids and antiemetics.  She is noting some improvement but is still feeling somewhat dehydrated.  We will continue with the second liter of fluid.  Will try p.o. challenge after that.  Care turned over to Dr. Tyrone Nine.  Anticipate discharge if patient is able tolerate p.o. Final Clinical Impression(s) / ED Diagnoses Final diagnoses:  Vomiting and diarrhea  Dehydration  Hyponatremia     Dorie Rank, MD 10/24/20 2315

## 2020-10-25 MED ORDER — ONDANSETRON 4 MG PO TBDP: ORAL_TABLET | ORAL | 0 refills | Status: DC

## 2020-10-25 NOTE — Discharge Instructions (Signed)
Take the Zofran for nausea.  You can take Imodium for diarrhea.  Please return for worsening abdominal pain if you develop a fever or inability to eat or drink.  Please follow-up with your family doctor.

## 2020-10-25 NOTE — ED Provider Notes (Signed)
See the patient in signout from Dr. Tomi Bamberger.  Patient 75 year old female with nausea vomiting and diarrhea.  Still symptomatic after first bolus of IV fluids.  Plan for second bolus of fluids.  Oral trial.  On reassessment the patient has finished their bolus of fluids and feels much better.  Will attempt an oral trial.  Tolerating PO without difficulty.    Deno Etienne, DO 10/25/20 [phone]

## 2020-10-25 NOTE — ED Notes (Signed)
Pt was given water with ice.

## 2020-10-25 NOTE — ED Notes (Signed)
Pt drank all the water stated she felt good, the provider was notified.

## 2020-11-18 ENCOUNTER — Emergency Department (HOSPITAL_BASED_OUTPATIENT_CLINIC_OR_DEPARTMENT_OTHER): Payer: Medicare HMO

## 2020-11-18 ENCOUNTER — Other Ambulatory Visit: Payer: Self-pay

## 2020-11-18 ENCOUNTER — Encounter (HOSPITAL_BASED_OUTPATIENT_CLINIC_OR_DEPARTMENT_OTHER): Payer: Self-pay | Admitting: Emergency Medicine

## 2020-11-18 ENCOUNTER — Emergency Department (HOSPITAL_BASED_OUTPATIENT_CLINIC_OR_DEPARTMENT_OTHER)
Admission: EM | Admit: 2020-11-18 | Discharge: 2020-11-19 | Disposition: A | Discharge status: Home / Self Care | Payer: Medicare HMO | Attending: Emergency Medicine | Admitting: Emergency Medicine

## 2020-11-18 DIAGNOSIS — Z7901 Long term (current) use of anticoagulants: Secondary | ICD-10-CM | POA: Diagnosis not present

## 2020-11-18 DIAGNOSIS — Z7982 Long term (current) use of aspirin: Secondary | ICD-10-CM | POA: Diagnosis not present

## 2020-11-18 DIAGNOSIS — J45909 Unspecified asthma, uncomplicated: Secondary | ICD-10-CM | POA: Insufficient documentation

## 2020-11-18 DIAGNOSIS — M25561 Pain in right knee: Secondary | ICD-10-CM | POA: Diagnosis present

## 2020-11-18 DIAGNOSIS — I10 Essential (primary) hypertension: Secondary | ICD-10-CM | POA: Insufficient documentation

## 2020-11-18 DIAGNOSIS — Z79899 Other long term (current) drug therapy: Secondary | ICD-10-CM | POA: Insufficient documentation

## 2020-11-18 DIAGNOSIS — E039 Hypothyroidism, unspecified: Secondary | ICD-10-CM | POA: Diagnosis not present

## 2020-11-18 DIAGNOSIS — Z853 Personal history of malignant neoplasm of breast: Secondary | ICD-10-CM | POA: Insufficient documentation

## 2020-11-18 DIAGNOSIS — M25461 Effusion, right knee: Secondary | ICD-10-CM | POA: Diagnosis not present

## 2020-11-18 MED ORDER — OXYCODONE-ACETAMINOPHEN 5-325 MG PO TABS
1.0000 | ORAL_TABLET | Freq: Once | ORAL | Status: AC
  Administered 2020-11-19: 1 via ORAL
  Filled 2020-11-18: qty 1

## 2020-11-18 NOTE — ED Triage Notes (Signed)
Pt reports injury to left knee two weeks ago and is still having pain, but her right knee just started hurting and swelling this evening; pt reports hx of DVT and having fluid drained from her knees; pt right knee is swollen during triage

## 2020-11-18 NOTE — ED Provider Notes (Signed)
[facility] Provider Note   CSN: [id number] Arrival date & time: 11/18/20  2102     History Chief Complaint  Patient presents with  . Knee Pain    [name] is a 75 y.o. female with past medical history significant for antiphospholipid antibody syndrome, arthritis, DVT, and osteoarthritis who presents the ED with complaints of atraumatic right knee pain.  She states that she actually injured her left knee a couple weeks ago via mechanical injury.  She states that it hurt for a few days, but has since improved.  However, today she felt as though her right knee was "filling up" which led to pain and difficulty ambulating.  She is unsure as to how it began.  Given her history of clotting disorder, concern for DVT.  She takes warfarin, as directed.    She states that she was able to ambulate here to the ED, albeit with significant discomfort.  She denies any true numbness or weakness.  She states that her range of motion is limited due to pain symptoms.  She denies any recent fevers, chills, STI/vaginal discharge, or history of IVDA.  She states that she feels completely fine aside from her knee discomfort and feeling that it is swollen.  She is followed by orthopedist Dr. Sharol Given.  HPI     Past Medical History:  Diagnosis Date  . Anxiety    severe panic attacks  . Arthritis   . Aseptic meningitis   . Asthma    as child  . Breast cancer ([place])    right  . Chronic anticoagulation 08/04/2015  . Collapsed lung    hx of, left  . Complication of anesthesia    "hard to put asleep"  . Depression   . Fibromyalgia   . GERD (gastroesophageal reflux disease)   . H/O hiatal hernia   . Headache(784.0)   . Hepatitis 1990   "from eating at restaurant"  . History of antiphospholipid antibody syndrome   . 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  . Hx-TIA (transient ischemic attack)   . Hyperlipidemia   . Hypertension   .  Hypothyroidism   . Neuromuscular disorder ([place])   . Personal history of radiation therapy   . Pneumonia    hx of  . Soft tissue mass 06/14/2017   3 cm right post axilla same side as previous breast cancer 06/14/17  . Stroke ([place])   . 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 Bilateral primary osteoarthritis of knee 12/27/2017  . Acute infection of nasal sinus 07/21/2017  . Prediabetes 06/29/2017  . Soft tissue mass 06/14/2017  . Displaced fracture of fifth metatarsal bone of right foot 02/07/2017  . Unilateral primary osteoarthritis, left knee 11/26/2016  . Unilateral primary osteoarthritis, right knee 11/26/2016  . Left leg pain   . Abnormal MRI of head   . Vision changes 04/13/2016  . Orthostatic hypotension 03/10/2016  . Vertigo 03/10/2016  . Depression 11/25/2015  . Essential (primary) hypertension 11/25/2015  . Hyperlipidemia 11/25/2015  . Temporary cerebral vascular dysfunction 11/25/2015  . Primary malignant neoplasm ([place]) 11/25/2015  . Sleep apnea 11/25/2015  . Chronic anticoagulation 08/04/2015  . History of anticoagulant therapy 08/04/2015  . Pain in limb 12/26/2013  . Chest pain 11/27/2013  . GERD - post failed  open Nissen 11/23/2012  . Dysphagia, unspecified(787.20) 09/28/2012  . Dysphagia 09/28/2012  . S/P Open Nissen 1998 Dr. Mendel Ryder 08/18/2012  . 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 Antiphospholipid antibody syndrome ([place]) 06/01/2011  . Lupus anticoagulant positive 06/01/2011  . Chest pain   . Hx-TIA (transient ischemic attack)   . Hypothyroidism   . Breast cancer ([place])   . History of DVT (deep vein thrombosis)   . History of antiphospholipid antibody syndrome     Past Surgical History:  Procedure Laterality Date  . ABDOMINAL HYSTERECTOMY    . BREAST LUMPECTOMY Right 2000  . BREAST  SURGERY Right    x3  . BUNIONECTOMY Bilateral 30 years ago  . CARDIAC CATHETERIZATION  07/12/1997   NORMAL LEFT VENTRICULAR FUNCTION WITH EF AT LEAST 70-75%  . ESOPHAGEAL MANOMETRY N/A 11/06/2012   Procedure: ESOPHAGEAL MANOMETRY (EM);  Surgeon: Pedro Earls, MD;  Location: [facility];  Service: General;  Laterality: N/A;  . ESOPHAGOGASTRODUODENOSCOPY ENDOSCOPY    . KNEE ARTHROSCOPY Right   . LAPAROSCOPIC NISSEN FUNDOPLICATION N/A [id number]   Procedure: LAPAROSCOPIC TAKEDOWN  PERI-HIATAL HERNIA    REPAIR;  Surgeon: Pedro Earls, MD;  Location: [facility];  Service: General;  Laterality: N/A;  . [place]  . RIGHT OOPHORECTOMY  1980  . THYROIDECTOMY  in 20's  . TONSILLECTOMY  75 years old  . UPPER GI ENDOSCOPY N/A 12/12/2012   Procedure: UPPER GI ENDOSCOPY;  Surgeon: Pedro Earls, MD;  Location: [facility];  Service: General;  Laterality: N/A;     OB History   No obstetric history on file.     Family History  Problem Relation Age of Onset  . Alzheimer's disease Mother   . Heart disease Mother   . Hyperlipidemia Mother   . Hypertension Mother   . Cancer Maternal Grandmother        colon  . Heart disease Sister   . Hypertension Sister   . Heart attack Sister   . Heart disease Brother   . Heart attack Brother   . Cancer Daughter   . Hypertension Daughter   . Hypertension Son     Social History   Tobacco Use  . Smoking status: Never Smoker  . Smokeless tobacco: Never Used  Vaping Use  . Vaping Use: Never used  Substance Use Topics  . Alcohol use: No    Alcohol/week: 0.0 standard drinks  . Drug use: No    Home Medications Prior to Admission medications   Medication Sig Start Date End Date Taking? Authorizing Provider  ALPRAZolam [name]) 1 MG tablet Take 1 mg by mouth 2 (two) times daily.  12/22/19   [provider]  Ascorbic Acid (VITAMIN C) 1000 MG tablet Take 1,000 mg by mouth daily.    [provider]  aspirin 81 MG tablet  Take 81 mg by mouth every evening.    [provider]  CALCIUM CARBONATE PO Take 1 tablet by mouth daily.    [provider]  Cyanocobalamin (VITAMIN B 12 PO) Take 1 tablet by mouth daily.     [provider]  fluticasone (FLONASE) 50 MCG/ACT nasal spray Place 2 sprays into the nose daily as needed for allergies.  10/16/14   [provider]  HYDROcodone-acetaminophen (NORCO/VICODIN) 5-325 MG per tablet Take 1 tablet by mouth 2 (two) times daily.    [provider]  levothyroxine (SYNTHROID, LEVOTHROID) 112 MCG tablet Take 112 mcg by mouth as directed. Take Only  on (Sun, Tues, Wed, Thurs, Sat)    [provider]  levothyroxine (SYNTHROID, LEVOTHROID) 125 MCG tablet Take 125 mcg by mouth as directed. Take Only [place] & Fri) 06/24/16   [provider]  magnesium oxide (MAG-OX) 400 MG tablet Take 400 mg by mouth daily.    [provider]  metoprolol succinate (TOPROL-XL) 25 MG 24 hr tablet Take 12.5 mg by mouth 2 (two) times daily.    [provider]  omeprazole (PRILOSEC) 20 MG capsule Take 20 mg by mouth daily.  02/06/13   [provider]  ondansetron (ZOFRAN ODT) 4 MG disintegrating tablet 4mg  ODT q4 hours prn nausea/vomit 10/25/20   Deno Etienne, DO  pravastatin (PRAVACHOL) 40 MG tablet Take 40 mg by mouth at bedtime. 01/15/20   [provider]  sertraline (ZOLOFT) 50 MG tablet Take 50 mg by mouth daily. 01/20/20   [provider]  warfarin (COUMADIN) 5 MG tablet Take 5 mg daily except on Mondays: take 2.5 mg (1/2 tablet) Patient taking differently: Take 1.5 tablets (7.5 mg) on (Mon & Fri) & Take 1 tablet (5 mg) all other days 05/11/18   Annia Belt, MD    Allergies    Bee venom, Hornet venom, Reglan [metoclopramide], Vitamin k, Baclofen, Cephalexin, Duloxetine, Benadryl [diphenhydramine hcl], Ciprofloxacin, Crestor [rosuvastatin calcium], Cymbalta [duloxetine hcl], Daypro [oxaprozin],  Doxycycline, Levofloxacin, Sulfonamide derivatives, and Zocor [simvastatin]  Review of Systems   Review of Systems  All other systems reviewed and are negative.   Physical Exam Updated Vital Signs BP 123/84 (BP Location: Left Arm)   Pulse (!) 57   Temp 99.2 F (37.3 C) (Oral)   Resp 18   Ht 5\' 5"  (1.651 m)   Wt 74.8 kg   SpO2 100%   BMI 27.46 kg/m   Physical Exam Vitals and nursing note reviewed. Exam conducted with a chaperone present.  Constitutional:      Appearance: Normal appearance.  HENT:     Head: Normocephalic and atraumatic.  Eyes:     General: No scleral icterus.    Conjunctiva/sclera: Conjunctivae normal.  Cardiovascular:     Rate and Rhythm: Normal rate.     Pulses: Normal pulses.  Pulmonary:     Effort: Pulmonary effort is normal.  Musculoskeletal:        General: Swelling and tenderness present.     Comments: Right knee is mildly swollen relative to contralateral knee.  Seems to predominantly involve suprapatellar bursa.  No significant warmth or overlying erythema.  No overlying induration.  ROM limited due to pain.  Pedal pulses intact and symmetric with contralateral foot.  Sensation is intact throughout.  Skin:    General: Skin is dry.  Neurological:     Mental Status: She is alert and oriented to person, place, and time.     GCS: GCS eye subscore is 4. GCS verbal subscore is 5. GCS motor subscore is 6.  Psychiatric:        Mood and Affect: Mood normal.        Behavior: Behavior normal.        Thought Content: Thought content normal.     ED Results / Procedures / Treatments   Labs (all labs ordered are listed, but only abnormal results are displayed) Labs Reviewed - No data to display  EKG None  Radiology US Venous Img Lower Right (DVT Study)  Result Date: 11/18/2020 CLINICAL DATA:  Right knee pain. EXAM: RIGHT LOWER EXTREMITY VENOUS DOPPLER ULTRASOUND TECHNIQUE: Gray-scale sonography  with compression, as well as color and duplex  ultrasound, were performed to evaluate the deep venous system(s) from the level of the common femoral vein through the popliteal and proximal calf veins. COMPARISON:  None. FINDINGS: VENOUS Normal compressibility of the common femoral, superficial femoral, and popliteal veins, as well as the visualized calf veins. Visualized portions of profunda femoral vein and great saphenous vein unremarkable. No filling defects to suggest DVT on grayscale or color Doppler imaging. Doppler waveforms show normal direction of venous flow, normal respiratory plasticity and response to augmentation. Limited views of the contralateral common femoral vein are unremarkable. OTHER None. Limitations: none IMPRESSION: No evidence of right lower extremity DVT. Electronically Signed   By: Keith Rake M.D.   On: 11/18/2020 22:24   DG Knee Complete 4 Views Right  Result Date: 11/18/2020 CLINICAL DATA:  Right knee pain EXAM: RIGHT KNEE - COMPLETE 4+ VIEW COMPARISON:  None. FINDINGS: Four view radiograph right knee demonstrates normal alignment. No acute fracture or dislocation. There is mild bicompartmental degenerative arthritis with tiny osteophyte formation involving the lateral and patellofemoral compartments. Joint spaces appear preserved. Small right knee effusion is present. There is degenerative enthesopathy involving the insertion of the quadriceps tendon upon the patella. IMPRESSION: No acute fracture or dislocation. Mild bicompartmental degenerative arthritis. Small right knee effusion. Electronically Signed   By: Fidela Salisbury MD   On: 11/18/2020 22:38    Procedures Procedures   Medications Ordered in ED Medications  oxyCODONE-acetaminophen (PERCOCET/ROXICET) 5-325 MG per tablet 1 tablet (1 tablet Oral Given 11/19/20 0000)    ED Course  I have reviewed the triage vital signs and the nursing notes.  Pertinent labs & imaging results that were available during my care of the patient were reviewed by me and  considered in my medical decision making (see chart for details).    MDM Rules/Calculators/A&P                          [name] was evaluated in Emergency Department on 11/19/2020 for the symptoms described in the history of present illness. She was evaluated in the context of the global COVID-19 pandemic, which necessitated consideration that the patient might be at risk for infection with the SARS-CoV-2 virus that causes COVID-19. Institutional protocols and algorithms that pertain to the evaluation of patients at risk for COVID-19 are in a state of rapid change based on information released by regulatory bodies including the CDC and federal and state organizations. These policies and algorithms were followed during the patient's care in the ED.  I personally reviewed patient's medical chart and all notes from triage and staff during today's encounter. I have also ordered and reviewed all labs and imaging that I felt to be medically necessary in the evaluation of this patient's complaints and with consideration of their physical exam. If needed, translation services were available and utilized.   Patient has what appears to look like a suprapatellar bursitis versus joint effusion.  She denies any history otherwise concerning for septic joint.  She is able to extend at 180 degrees and flex to 100 degrees, albeit with discomfort.  There is no significant overlying erythema or warmth.  I have lower suspicion for septic arthritis at this time.  Given her recent history of left knee mechanical injury, perhaps she was favoring that leg which caused increased load on right knee.  Given patient's history of DVT in context of antiphospholipid antibody syndrome, will proceed  with DVT study given atraumatic right knee swelling.  We will also proceed with plain films to evaluate for any obvious or acute bony abnormalities.  Plain films are personally reviewed and demonstrate a small right knee  effusion, but no fracture or dislocation.  There is bicompartmental degenerative arthritis noted.  Venous ultrasound is without evidence of DVT.    Discussed diagnostic tap to evaluate for septic arthritis.  While I have lower suspicion, that would be a reasonable next step given unclear cause for her effusion.  She states that her mother who has Alzheimer's dementia is outside in the parking lot and she would prefer to instead follow-up with her orthopedist, Dr. Sharol Given, tomorrow for ongoing evaluation and management.  We will provide brace here in the ED.  She states that she has a few remaining pain medications at home that she can take as needed.  I discussed very strict ED return precautions.  Patient voices understanding and is agreeable to the plan.  She declines crutches.   Final Clinical Impression(s) / ED Diagnoses Final diagnoses:  Effusion of right knee    Rx / DC Orders ED Discharge Orders    None       Corena Herter, PA-C 11/19/20 0002    Lucrezia Starch, MD 11/19/20 1057

## 2020-11-19 NOTE — Discharge Instructions (Addendum)
You had a small joint effusion seen on x-ray.  While we have lower suspicion for septic arthritis at this time, if you develop fever, chills, worsening pain/redness, please return to the ED as we would likely need to do a diagnostic arthrocentesis (tap).  Since we did not do that tonight, please call the office of Dr. Sharol Given, your orthopedist, today to schedule appointment for close outpatient follow-up this week.  Please take your at home pain medications as needed.  Notify your primary care provider of today's ED encounter.

## 2020-11-20 ENCOUNTER — Ambulatory Visit (INDEPENDENT_AMBULATORY_CARE_PROVIDER_SITE_OTHER): Payer: Medicare HMO | Admitting: Orthopedic Surgery

## 2020-11-20 DIAGNOSIS — M25461 Effusion, right knee: Secondary | ICD-10-CM

## 2020-11-21 ENCOUNTER — Encounter: Payer: Self-pay | Admitting: Orthopedic Surgery

## 2020-11-21 DIAGNOSIS — M25461 Effusion, right knee: Secondary | ICD-10-CM

## 2020-11-21 MED ORDER — METHYLPREDNISOLONE ACETATE 40 MG/ML IJ SUSP
40.0000 mg | INTRAMUSCULAR | Status: AC | PRN
  Administered 2020-11-21: 40 mg via INTRA_ARTICULAR

## 2020-11-21 MED ORDER — LIDOCAINE HCL (PF) 1 % IJ SOLN
5.0000 mL | INTRAMUSCULAR | Status: AC | PRN
  Administered 2020-11-21: 5 mL

## 2020-11-21 NOTE — Progress Notes (Signed)
Office Visit Note   Patient: [name]           Date of Birth: July 10, 1946           MRN: [id number] Visit Date: 11/20/2020              Requested by: [name]., PA-C [address]  [place] [address] PCP: [name]., PA-C  Chief Complaint  Patient presents with  . Right Knee - Follow-up    [facility] 11/18/20 right knee effusion       HPI: Patient is a 75 year old woman who presents with a acute effusion right knee.  Patient states that she went to [facility] on April 26 radiographs were obtained and ultrasound was negative for DVT she is currently using a open patella hinged brace patient has pain with any attempted range of motion of her knee patient states the swelling was acute she denies any trauma.  Patient has no history of gout and no history of tobacco use.  Patient is on Coumadin she states that her last INR was 2.4.  Assessment & Plan: Visit Diagnoses:  1. Effusion, right knee     Plan: Patient will follow up with checking her INR we will follow-up in the office.  This appears to be an acute hemarthrosis most likely from her anticoagulation therapy.  Follow-Up Instructions: Return in about 1 week (around 11/27/2020).   Ortho Exam  Patient is alert, oriented, no adenopathy, well-dressed, normal affect, normal respiratory effort. Examination patient has a effusion of the right knee there is no redness no cellulitis there is pain with range of motion.  Patient's knee was aspirated from the superior lateral portal and this was consistent with a hemarthrosis there is no purulence no clinical signs of infection or gout.  Patient does have a history of lupus antiphospholipid antibody syndrome.  Imaging: No results found. No images are attached to the encounter.  Labs: Lab Results  Component Value Date   HGBA1C 6.3 (H) 06/28/2017   HGBA1C 6.0 (H) 04/14/2016   ESRSEDRATE 5 06/28/2017   ESRSEDRATE 11 04/18/2016    ESRSEDRATE 11 11/04/2009   CRP 3.6 06/28/2017   CRP 1.3 (H) 04/18/2016   REPTSTATUS 04/20/2016 FINAL 04/16/2016   GRAMSTAIN  04/16/2016    CYTOSPIN SMEAR WBC PRESENT, PREDOMINANTLY MONONUCLEAR NO ORGANISMS SEEN    CULT NO GROWTH 3 DAYS 04/16/2016     Lab Results  Component Value Date   ALBUMIN 4.4 10/24/2020   ALBUMIN 4.4 01/21/2020   ALBUMIN 4.6 09/19/2018    Lab Results  Component Value Date   MG 2.5 (H) 01/11/2018   MG 2.2 10/07/2016   MG 2.2 04/15/2016   Lab Results  Component Value Date   VD25OH 41.5 06/28/2017    No results found for: PREALBUMIN CBC EXTENDED Latest Ref Rng & Units 10/24/2020 01/21/2020 09/19/2018  WBC 4.0 - 10.5 K/uL 7.5 7.1 7.3  RBC 3.87 - 5.11 MIL/uL 4.47 4.36 4.44  HGB 12.0 - 15.0 g/dL 13.8 13.4 13.3  HCT 36.0 - 46.0 % 40.9 41.6 41.3  PLT 150 - 400 K/uL 201 220 258  NEUTROABS 1.7 - 7.7 K/uL 6.3 - 4.1  LYMPHSABS 0.7 - 4.0 K/uL 0.7 - 2.3     There is no height or weight on file to calculate BMI.  Orders:  No orders of the defined types were placed in this encounter.  No orders of the defined types were placed in  this encounter.    Procedures: Large Joint Inj: R knee on 11/21/2020 4:14 PM Indications: pain and diagnostic evaluation Details: 22 G 1.5 in needle, superolateral approach  Arthrogram: No  Medications: 5 mL lidocaine (PF) 1 %; 40 mg methylPREDNISolone acetate 40 MG/ML Aspirate: 15 mL bloody Outcome: tolerated well, no immediate complications Procedure, treatment alternatives, risks and benefits explained, specific risks discussed. Consent was given by the patient. Immediately prior to procedure a time out was called to verify the correct patient, procedure, equipment, support staff and site/side marked as required. Patient was prepped and draped in the usual sterile fashion.      Clinical Data: No additional findings.  ROS:  All other systems negative, except as noted in the HPI. Review of Systems  Objective: Vital  Signs: There were no vitals taken for this visit.  Specialty Comments:  No specialty comments available.  PMFS History: Patient Active Problem List   Diagnosis Date Noted  . Bilateral primary osteoarthritis of knee 12/27/2017  . Acute infection of nasal sinus 07/21/2017  . Prediabetes 06/29/2017  . Soft tissue mass 06/14/2017  . Displaced fracture of fifth metatarsal bone of right foot 02/07/2017  . Unilateral primary osteoarthritis, left knee 11/26/2016  . Unilateral primary osteoarthritis, right knee 11/26/2016  . Left leg pain   . Abnormal MRI of head   . Vision changes 04/13/2016  . Orthostatic hypotension 03/10/2016  . Vertigo 03/10/2016  . Depression 11/25/2015  . Essential (primary) hypertension 11/25/2015  . Hyperlipidemia 11/25/2015  . Temporary cerebral vascular dysfunction 11/25/2015  . Primary malignant neoplasm ([place]) 11/25/2015  . Sleep apnea 11/25/2015  . Chronic anticoagulation 08/04/2015  . History of anticoagulant therapy 08/04/2015  . Pain in limb 12/26/2013  . Chest pain 11/27/2013  . GERD - post failed open Nissen 11/23/2012  . Dysphagia, unspecified(787.20) 09/28/2012  . Dysphagia 09/28/2012  . S/P Open Nissen 1998 Dr. Mendel Ryder 08/18/2012  . 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 Antiphospholipid antibody syndrome ([place]) 06/01/2011  . Lupus anticoagulant positive 06/01/2011  . Chest pain   . Hx-TIA (transient ischemic attack)   . Hypothyroidism   . Breast cancer ([place])   . History of DVT (deep vein thrombosis)   . History of antiphospholipid antibody syndrome    Past Medical History:  Diagnosis Date  . Anxiety    severe panic attacks  . Arthritis   . Aseptic meningitis   . Asthma    as child  . Breast cancer ([place])    right  . Chronic anticoagulation 08/04/2015  . Collapsed lung    hx of, left  . Complication of anesthesia    "hard to put asleep"  . Depression   . Fibromyalgia   . GERD (gastroesophageal reflux disease)   .  H/O hiatal hernia   . Headache(784.0)   . Hepatitis 1990   "from eating at restaurant"  . History of antiphospholipid antibody syndrome   . 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  . Hx-TIA (transient ischemic attack)   . Hyperlipidemia   . Hypertension   . Hypothyroidism   . Neuromuscular disorder ([place])   . Personal history of radiation therapy   . Pneumonia    hx of  . Soft tissue mass 06/14/2017   3 cm right post axilla same side as previous breast cancer 06/14/17  . Stroke ([place])   . 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 Alzheimer's disease Mother   . Heart disease Mother   . Hyperlipidemia Mother   . Hypertension Mother   . Cancer Maternal Grandmother        colon  . Heart disease Sister   . Hypertension Sister   . Heart attack Sister   . Heart disease Brother   . Heart attack Brother   . Cancer Daughter   . Hypertension Daughter   . Hypertension Son     Past Surgical History:  Procedure Laterality Date  . ABDOMINAL HYSTERECTOMY    . BREAST LUMPECTOMY Right 2000  . BREAST SURGERY Right    x3  . BUNIONECTOMY Bilateral 30 years ago  . CARDIAC CATHETERIZATION  07/12/1997   NORMAL LEFT VENTRICULAR FUNCTION WITH EF AT LEAST 70-75%  . ESOPHAGEAL MANOMETRY N/A 11/06/2012   Procedure: ESOPHAGEAL MANOMETRY (EM);  Surgeon: Pedro Earls, MD;  [facility]: [facility];  Service: General;  Laterality: N/A;  . ESOPHAGOGASTRODUODENOSCOPY ENDOSCOPY    . KNEE ARTHROSCOPY Right   . LAPAROSCOPIC NISSEN FUNDOPLICATION N/A 02/25/2121   Procedure: LAPAROSCOPIC TAKEDOWN  PERI-HIATAL HERNIA    REPAIR;  Surgeon: Pedro Earls, MD;  Location: [facility];  Service: General;  Laterality: N/A;  . [place]  . RIGHT OOPHORECTOMY  1980  . THYROIDECTOMY  in 20's  . TONSILLECTOMY  75 years old  . UPPER GI ENDOSCOPY  N/A 12/12/2012   Procedure: UPPER GI ENDOSCOPY;  Surgeon: Pedro Earls, MD;  Location: [facility];  Service: General;  Laterality: N/A;   Social History   Occupational History  . Not on file  Tobacco Use  . Smoking status: Never Smoker  . Smokeless tobacco: Never Used  Vaping Use  . Vaping Use: Never used  Substance and Sexual Activity  . Alcohol use: No    Alcohol/week: 0.0 standard drinks  . Drug use: No  . Sexual activity: Not on file

## 2020-11-27 ENCOUNTER — Encounter: Payer: Self-pay | Admitting: Orthopedic Surgery

## 2020-11-27 ENCOUNTER — Ambulatory Visit: Payer: Medicare HMO | Admitting: Physician Assistant

## 2020-11-27 DIAGNOSIS — M25461 Effusion, right knee: Secondary | ICD-10-CM | POA: Diagnosis not present

## 2020-11-27 NOTE — Progress Notes (Signed)
Office Visit Note   Patient: [name]           Date of Birth: 1946-07-10           MRN: [id number] Visit Date: 11/27/2020              Requested by: [name]., PA-C [address]  [place] [address] PCP: [name]., PA-C  Chief Complaint  Patient presents with  . Right Knee - Follow-up      HPI: Patient i follows up today for her right knee.  At her last visit 1 week ago she had a knee effusion which was aspirated by Dr. Sharol Given.  She said a couple days later she began having significant bruising on the inside of her thigh and the back of her knee.  She is on Coumadin and her most recent INR is 2.3.  She is very tender over the areas of bruising.  Assessment & Plan: Visit Diagnoses: No diagnosis found.  Plan: I do not see any signs of infection effusion or bleeding.  She does seem very tender along the hamstring findings consistent with possible hamstring tear.  I explained the natural history of this.  She should do some gentle range of motion.  Unfortunately she cannot use any oral anti-inflammatories.  She is on chronic pain management.  She may alternate ice and heat as needed but not next to the skin.  Follow-up in 2 weeks.  Follow-Up Instructions: No follow-ups on file.   Ortho Exam  Patient is alert, oriented, no adenopathy, well-dressed, normal affect, normal respiratory effort. Examination of her knees she has no cellulitis no erythema maybe only a very minimal effusion.  She has very tender bruising on the medial thigh and on the back of her knee.  She has pain with resisted flexion of her leg over the hamstring.  No significant swelling no signs of infection.  No signs of active bleeding  Imaging: No results found. No images are attached to the encounter.  Labs: Lab Results  Component Value Date   HGBA1C 6.3 (H) 06/28/2017   HGBA1C 6.0 (H) 04/14/2016   ESRSEDRATE 5 06/28/2017   ESRSEDRATE 11 04/18/2016   ESRSEDRATE 11  11/04/2009   CRP 3.6 06/28/2017   CRP 1.3 (H) 04/18/2016   REPTSTATUS 04/20/2016 FINAL 04/16/2016   GRAMSTAIN  04/16/2016    CYTOSPIN SMEAR WBC PRESENT, PREDOMINANTLY MONONUCLEAR NO ORGANISMS SEEN    CULT NO GROWTH 3 DAYS 04/16/2016     Lab Results  Component Value Date   ALBUMIN 4.4 10/24/2020   ALBUMIN 4.4 01/21/2020   ALBUMIN 4.6 09/19/2018    Lab Results  Component Value Date   MG 2.5 (H) 01/11/2018   MG 2.2 10/07/2016   MG 2.2 04/15/2016   Lab Results  Component Value Date   VD25OH 41.5 06/28/2017    No results found for: PREALBUMIN CBC EXTENDED Latest Ref Rng & Units 10/24/2020 01/21/2020 09/19/2018  WBC 4.0 - 10.5 K/uL 7.5 7.1 7.3  RBC 3.87 - 5.11 MIL/uL 4.47 4.36 4.44  HGB 12.0 - 15.0 g/dL 13.8 13.4 13.3  HCT 36.0 - 46.0 % 40.9 41.6 41.3  PLT 150 - 400 K/uL 201 220 258  NEUTROABS 1.7 - 7.7 K/uL 6.3 - 4.1  LYMPHSABS 0.7 - 4.0 K/uL 0.7 - 2.3     There is no height or weight on file to calculate BMI.  Orders:  No orders of the defined types were placed in this  encounter.  No orders of the defined types were placed in this encounter.    Procedures: No procedures performed  Clinical Data: No additional findings.  ROS:  All other systems negative, except as noted in the HPI. Review of Systems  Objective: Vital Signs: There were no vitals taken for this visit.  Specialty Comments:  No specialty comments available.  PMFS History: Patient Active Problem List   Diagnosis Date Noted  . Bilateral primary osteoarthritis of knee 12/27/2017  . Acute infection of nasal sinus 07/21/2017  . Prediabetes 06/29/2017  . Soft tissue mass 06/14/2017  . Displaced fracture of fifth metatarsal bone of right foot 02/07/2017  . Unilateral primary osteoarthritis, left knee 11/26/2016  . Unilateral primary osteoarthritis, right knee 11/26/2016  . Left leg pain   . Abnormal MRI of head   . Vision changes 04/13/2016  . Orthostatic hypotension 03/10/2016  . Vertigo  03/10/2016  . Depression 11/25/2015  . Essential (primary) hypertension 11/25/2015  . Hyperlipidemia 11/25/2015  . Temporary cerebral vascular dysfunction 11/25/2015  . Primary malignant neoplasm ([place]) 11/25/2015  . Sleep apnea 11/25/2015  . Chronic anticoagulation 08/04/2015  . History of anticoagulant therapy 08/04/2015  . Pain in limb 12/26/2013  . Chest pain 11/27/2013  . GERD - post failed open Nissen 11/23/2012  . Dysphagia, unspecified(787.20) 09/28/2012  . Dysphagia 09/28/2012  . S/P Open Nissen 1998 Dr. Mendel Ryder 08/18/2012  . 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 Lupus anticoagulant positive 06/01/2011  . Chest pain   . Hx-TIA (transient ischemic attack)   . Hypothyroidism   . Breast cancer ([place])   . History of DVT (deep vein thrombosis)   . History of antiphospholipid antibody syndrome    Past Medical History:  Diagnosis Date  . Anxiety    severe panic attacks  . Arthritis   . Aseptic meningitis   . Asthma    as child  . Breast cancer ([place])    right  . Chronic anticoagulation 08/04/2015  . Collapsed lung    hx of, left  . Complication of anesthesia    "hard to put asleep"  . Depression   . Fibromyalgia   . GERD (gastroesophageal reflux disease)   . H/O hiatal hernia   . Headache(784.0)   . Hepatitis 1990   "from eating at restaurant"  . History of antiphospholipid antibody syndrome   . 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  . Hx-TIA (transient ischemic attack)   . Hyperlipidemia   . Hypertension   . Hypothyroidism   . Neuromuscular disorder ([place])   . Personal history of radiation therapy   . Pneumonia    hx of  . Soft tissue mass 06/14/2017   3 cm right post axilla same side as previous breast cancer 06/14/17  . Stroke ([place])   . 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 Alzheimer's disease Mother   . Heart disease Mother   . Hyperlipidemia Mother   . Hypertension Mother   . Cancer Maternal Grandmother        colon  . Heart disease Sister   . Hypertension Sister   . Heart attack Sister   . Heart disease Brother   . Heart attack Brother   . Cancer Daughter   . Hypertension Daughter   .  Hypertension Son     Past Surgical History:  Procedure Laterality Date  . ABDOMINAL HYSTERECTOMY    . BREAST LUMPECTOMY Right 2000  . BREAST SURGERY Right    x3  . BUNIONECTOMY Bilateral 30 years ago  . CARDIAC CATHETERIZATION  07/12/1997   NORMAL LEFT VENTRICULAR FUNCTION WITH EF AT LEAST 70-75%  . ESOPHAGEAL MANOMETRY N/A 11/06/2012   Procedure: ESOPHAGEAL MANOMETRY (EM);  Surgeon: Pedro Earls, MD;  [facility]: [facility];  Service: General;  Laterality: N/A;  . ESOPHAGOGASTRODUODENOSCOPY ENDOSCOPY    . KNEE ARTHROSCOPY Right   . LAPAROSCOPIC NISSEN FUNDOPLICATION N/A 07/31/2692   Procedure: LAPAROSCOPIC TAKEDOWN  PERI-HIATAL HERNIA    REPAIR;  Surgeon: Pedro Earls, MD;  Location: [facility];  Service: General;  Laterality: N/A;  . [place]  . RIGHT OOPHORECTOMY  1980  . THYROIDECTOMY  in 20's  . TONSILLECTOMY  75 years old  . UPPER GI ENDOSCOPY N/A 12/12/2012   Procedure: UPPER GI ENDOSCOPY;  Surgeon: Pedro Earls, MD;  [facility];  Service: General;  Laterality: N/A;   Social History   Occupational History  . Not on file  Tobacco Use  . Smoking status: Never Smoker  . Smokeless tobacco: Never Used  Vaping Use  . Vaping Use: Never used  Substance and Sexual Activity  . Alcohol use: No    Alcohol/week: 0.0 standard drinks  . Drug use: No  . Sexual activity: Not on file

## 2020-12-03 ENCOUNTER — Encounter: Payer: Self-pay | Admitting: Family

## 2020-12-03 ENCOUNTER — Ambulatory Visit: Payer: Self-pay

## 2020-12-03 ENCOUNTER — Telehealth: Payer: Self-pay | Admitting: Physician Assistant

## 2020-12-03 ENCOUNTER — Ambulatory Visit: Payer: Medicare HMO | Admitting: Family

## 2020-12-03 DIAGNOSIS — S299XXA Unspecified injury of thorax, initial encounter: Secondary | ICD-10-CM

## 2020-12-03 DIAGNOSIS — M25461 Effusion, right knee: Secondary | ICD-10-CM | POA: Diagnosis not present

## 2020-12-03 MED ORDER — HYDROCODONE-ACETAMINOPHEN 5-325 MG PO TABS: 1.0000 | ORAL_TABLET | Freq: Four times a day (QID) | ORAL | 0 refills | Status: DC | PRN

## 2020-12-03 MED ORDER — LIDOCAINE 5 % EX PTCH: 1.0000 | MEDICATED_PATCH | CUTANEOUS | 0 refills | Status: DC

## 2020-12-03 NOTE — Telephone Encounter (Signed)
Patient called requesting a call back. Patient states pharmacy will not refill pain meds. Please call patient at [phone].

## 2020-12-03 NOTE — Telephone Encounter (Signed)
Patients states she just got OTC Patches since the Rx was so expensive

## 2020-12-12 ENCOUNTER — Ambulatory Visit: Payer: Medicare HMO | Admitting: Physician Assistant

## 2020-12-17 ENCOUNTER — Ambulatory Visit: Payer: Medicare HMO | Admitting: Family

## 2020-12-29 ENCOUNTER — Other Ambulatory Visit: Payer: Self-pay

## 2020-12-29 ENCOUNTER — Ambulatory Visit (INDEPENDENT_AMBULATORY_CARE_PROVIDER_SITE_OTHER): Payer: Medicare HMO | Admitting: Orthopedic Surgery

## 2020-12-29 DIAGNOSIS — M25461 Effusion, right knee: Secondary | ICD-10-CM

## 2020-12-30 NOTE — Progress Notes (Signed)
Office Visit Note   Patient: [name]           Date of Birth: March 26, 1946           MRN: [id number] Visit Date: 12/03/2020              Requested by: [name]., PA-C [address]  [place] [address] PCP: [name]., PA-C  Chief Complaint  Patient presents with   Right Knee - Pain, Edema   Rib Injury    S/p fall       HPI: The patient is a 75 year old woman who presents today for 2 separate issues.  She is complaining of some left flank pain as well as right knee pain after falling about a week ago.  [place] on May 5.  She has had some associated swelling in her right knee but this also did bruise she has pain with flexion. Difficulty with flexion due to swelling.  Pain to left flank and lower ribs. Quite concerned for rib fracture. No chest pain. No difficulty breathing or shortness of breath.   Assessment & Plan: Visit Diagnoses:  1. Effusion, right knee   2. Rib injury     Plan: reassurance provided. No acute fracture. Conservative measures. Hot compress for contusion. Antiinflammatories by mouth. Will follow up as needed.  Follow-Up Instructions: No follow-ups on file.   Right Knee Exam   Tenderness  The patient is experiencing tenderness in the medial joint line and lateral joint line.  Range of Motion  The patient has normal right knee ROM.  Tests  Varus: negative Valgus: negative  Other  Erythema: absent Swelling: mild     Patient is alert, oriented, no adenopathy, well-dressed, normal affect, normal respiratory effort.  Patient declined chest palpation. Some mild edema to RLE. No erythema, ecchymosis. No palpable cords or nodules.  Imaging: No results found. No images are attached to the encounter.  Labs: Lab Results  Component Value Date   HGBA1C 6.3 (H) 06/28/2017   HGBA1C 6.0 (H) 04/14/2016   ESRSEDRATE 5 06/28/2017   ESRSEDRATE 11 04/18/2016   ESRSEDRATE 11 11/04/2009   CRP 3.6 06/28/2017   CRP 1.3  (H) 04/18/2016   REPTSTATUS 04/20/2016 FINAL 04/16/2016   GRAMSTAIN  04/16/2016    CYTOSPIN SMEAR WBC PRESENT, PREDOMINANTLY MONONUCLEAR NO ORGANISMS SEEN    CULT NO GROWTH 3 DAYS 04/16/2016     Lab Results  Component Value Date   ALBUMIN 4.4 10/24/2020   ALBUMIN 4.4 01/21/2020   ALBUMIN 4.6 09/19/2018    Lab Results  Component Value Date   MG 2.5 (H) 01/11/2018   MG 2.2 10/07/2016   MG 2.2 04/15/2016   Lab Results  Component Value Date   VD25OH 41.5 06/28/2017    No results found for: PREALBUMIN CBC EXTENDED Latest Ref Rng & Units 10/24/2020 01/21/2020 09/19/2018  WBC 4.0 - 10.5 K/uL 7.5 7.1 7.3  RBC 3.87 - 5.11 MIL/uL 4.47 4.36 4.44  HGB 12.0 - 15.0 g/dL 13.8 13.4 13.3  HCT 36.0 - 46.0 % 40.9 41.6 41.3  PLT 150 - 400 K/uL 201 220 258  NEUTROABS 1.7 - 7.7 K/uL 6.3 - 4.1  LYMPHSABS 0.7 - 4.0 K/uL 0.7 - 2.3     There is no height or weight on file to calculate BMI.  Orders:  Orders Placed This Encounter  Procedures   XR Knee 1-2 Views Right   XR Ribs Unilateral Left   Meds ordered this encounter  Medications   HYDROcodone-acetaminophen (NORCO/VICODIN) 5-325 MG tablet    Sig: Take 1 tablet by mouth every 6 (six) hours as needed for severe pain.    Dispense:  30 tablet    Refill:  0   lidocaine (LIDODERM) 5 %    Sig: Place 1 patch onto the skin daily. Remove & Discard patch within 12 hours or as directed by MD    Dispense:  30 patch    Refill:  0     Procedures: No procedures performed  Clinical Data: No additional findings.  ROS:  All other systems negative, except as noted in the HPI. Review of Systems  Constitutional:  Negative for chills and fever.  Musculoskeletal:  Positive for arthralgias and myalgias.   Objective: Vital Signs: There were no vitals taken for this visit.  Specialty Comments:  No specialty comments available.  PMFS History: Patient Active Problem List   Diagnosis Date Noted   Bilateral primary osteoarthritis of knee  12/27/2017   Acute infection of nasal sinus 07/21/2017   Prediabetes 06/29/2017   Soft tissue mass 06/14/2017   Displaced fracture of fifth metatarsal bone of right foot 02/07/2017   Unilateral primary osteoarthritis, left knee 11/26/2016   Unilateral primary osteoarthritis, right knee 11/26/2016   Left leg pain    Abnormal MRI of head    Vision changes 04/13/2016   Orthostatic hypotension 03/10/2016   Vertigo 03/10/2016   Depression 11/25/2015   Essential (primary) hypertension 11/25/2015   Hyperlipidemia 11/25/2015   Temporary cerebral vascular dysfunction 11/25/2015   Primary malignant neoplasm ([place]) 11/25/2015   Sleep apnea 11/25/2015   Chronic anticoagulation 08/04/2015   History of anticoagulant therapy 08/04/2015   Pain in limb 12/26/2013   Chest pain 11/27/2013   GERD - post failed open Nissen 11/23/2012   Dysphagia, unspecified(787.20) 09/28/2012   Dysphagia 09/28/2012   S/P Open Nissen 1998 Dr. 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   Lupus anticoagulant positive 06/01/2011   Chest pain    Hx-TIA (transient ischemic attack)    Hypothyroidism    Breast cancer ([place])    History of DVT (deep vein thrombosis)    History of antiphospholipid antibody syndrome    Past Medical History:  Diagnosis Date   Anxiety    severe panic attacks   Arthritis    Aseptic meningitis    Asthma    as child   Breast cancer ([place])    right   Chronic anticoagulation 08/04/2015   Collapsed lung    hx of, left   Complication of anesthesia    "hard to put asleep"   Depression    Fibromyalgia    GERD (gastroesophageal reflux disease)    H/O hiatal hernia    Headache(784.0)    Hepatitis 1990   "from eating at restaurant"   History of antiphospholipid antibody syndrome    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   Hx-TIA (transient ischemic attack)    Hyperlipidemia     Hypertension    Hypothyroidism    Neuromuscular disorder ([place])    Personal history of radiation therapy    Pneumonia    hx of   Soft tissue mass 06/14/2017   3 cm right post axilla same side as previous breast cancer 06/14/17   Stroke [facility])    Thrombosis, 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   Left digital artery  October 1998 - new dx antiphospholipid antibody  syndrome    Family History  Problem Relation Age of Onset   Alzheimer's disease Mother    Heart disease Mother    Hyperlipidemia Mother    Hypertension Mother    Cancer Maternal Grandmother        colon   Heart disease Sister    Hypertension Sister    Heart attack Sister    Heart disease Brother    Heart attack Brother    Cancer Daughter    Hypertension Daughter    Hypertension Son     Past Surgical History:  Procedure Laterality Date   ABDOMINAL HYSTERECTOMY     BREAST LUMPECTOMY Right 2000   BREAST SURGERY Right    x3   BUNIONECTOMY Bilateral 30 years ago   CARDIAC CATHETERIZATION  07/12/1997   NORMAL LEFT VENTRICULAR FUNCTION WITH EF AT LEAST 70-75%   ESOPHAGEAL MANOMETRY N/A 11/06/2012   Procedure: ESOPHAGEAL MANOMETRY (EM);  Surgeon: Pedro Earls, MD;  Location: [facility];  Service: General;  Laterality: N/A;   ESOPHAGOGASTRODUODENOSCOPY ENDOSCOPY     KNEE ARTHROSCOPY Right    LAPAROSCOPIC NISSEN FUNDOPLICATION N/A 8/37/2902   Procedure: LAPAROSCOPIC TAKEDOWN  PERI-HIATAL HERNIA    REPAIR;  Surgeon: Pedro Earls, MD;  [facility];  Service: General;  Laterality: N/A;   [place]  in 20's   TONSILLECTOMY  75 years old   UPPER GI ENDOSCOPY N/A 12/12/2012   Procedure: UPPER GI ENDOSCOPY;  Surgeon: Pedro Earls, MD;  [facility];  Service: General;  Laterality: N/A;   Social History   Occupational History   Not on file  Tobacco Use   Smoking status: Never Smoker   Smokeless tobacco: Never Used  Vaping Use   Vaping Use:  Never used  Substance and Sexual Activity   Alcohol use: No    Alcohol/week: 0.0 standard drinks   Drug use: No   Sexual activity: Not on file

## 2021-01-08 DIAGNOSIS — R141 Gas pain: Secondary | ICD-10-CM | POA: Insufficient documentation

## 2021-01-08 DIAGNOSIS — K449 Diaphragmatic hernia without obstruction or gangrene: Secondary | ICD-10-CM | POA: Insufficient documentation

## 2021-01-08 DIAGNOSIS — R143 Flatulence: Secondary | ICD-10-CM | POA: Insufficient documentation

## 2021-01-13 ENCOUNTER — Encounter: Payer: Self-pay | Admitting: Orthopedic Surgery

## 2021-01-13 NOTE — Progress Notes (Signed)
Office Visit Note   Patient: [name]           Date of Birth: 03/29/1946           MRN: [id number] Visit Date: 12/29/2020              Requested by: [name]., PA-C [address]  [place] [address] PCP: [name]., PA-C  Chief Complaint  Patient presents with   Right Knee - Follow-up      HPI: Patient is a 75 year old woman who presents in follow-up for osteoarthritis of her right knee with recurrent effusions.  She states she has decreased range of motion trying to get in and out of the car continues to have swelling pain lateral and medial as well as posteriorly.  Patient has had a history of patella dislocation.  Patient has had arthroscopy of her knee has had no relief with ice or heat she is currently on Coumadin with a history of a DVT her INR runs between 2 and 3.  Assessment & Plan: Visit Diagnoses:  1. Effusion, right knee     Plan: Recommended Voltaren gel ice and a cane recommended knee-high compression socks discussed the possibility of a total knee arthroplasty.  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  Follow-Up Instructions: Return in about 4 weeks (around 01/26/2021).   Ortho Exam  Patient is alert, oriented, no adenopathy, well-dressed, normal affect, normal respiratory effort. Examination patient has range of motion from 0 to 90 degrees collaterals are cruciates are stable there is a mild effusion she is tender to palpation the patellofemoral joint as well as medial and lateral joint lines.  Collaterals and cruciates are stable.  Imaging: No results found. No images are attached to the encounter.  Labs: Lab Results  Component Value Date   HGBA1C 6.3 (H) 06/28/2017   HGBA1C 6.0 (H) 04/14/2016   ESRSEDRATE 5 06/28/2017   ESRSEDRATE 11 04/18/2016   ESRSEDRATE 11 11/04/2009   CRP 3.6 06/28/2017   CRP 1.3 (H) 04/18/2016   REPTSTATUS  04/20/2016 FINAL 04/16/2016   GRAMSTAIN  04/16/2016    CYTOSPIN SMEAR WBC PRESENT, PREDOMINANTLY MONONUCLEAR NO ORGANISMS SEEN    CULT NO GROWTH 3 DAYS 04/16/2016     Lab Results  Component Value Date   ALBUMIN 4.4 10/24/2020   ALBUMIN 4.4 01/21/2020   ALBUMIN 4.6 09/19/2018    Lab Results  Component Value Date   MG 2.5 (H) 01/11/2018   MG 2.2 10/07/2016   MG 2.2 04/15/2016   Lab Results  Component Value Date   VD25OH 41.5 06/28/2017    No results found for: PREALBUMIN CBC EXTENDED Latest Ref Rng & Units 10/24/2020 01/21/2020 09/19/2018  WBC 4.0 - 10.5 K/uL 7.5 7.1 7.3  RBC 3.87 - 5.11 MIL/uL 4.47 4.36 4.44  HGB 12.0 - 15.0 g/dL 13.8 13.4 13.3  HCT 36.0 - 46.0 % 40.9 41.6 41.3  PLT 150 - 400 K/uL 201 220 258  NEUTROABS 1.7 - 7.7 K/uL 6.3 - 4.1  LYMPHSABS 0.7 - 4.0 K/uL 0.7 - 2.3     There is no height or weight on file to calculate BMI.  Orders:  No orders of the defined types were placed in this encounter.  No orders of the defined types were placed in this encounter.    Procedures: No procedures performed  Clinical Data: No additional findings.  ROS:  All  other systems negative, except as noted in the HPI. Review of Systems  Objective: Vital Signs: There were no vitals taken for this visit.  Specialty Comments:  No specialty comments available.  PMFS History: Patient Active Problem List   Diagnosis Date Noted   Bilateral primary osteoarthritis of knee 12/27/2017   Acute infection of nasal sinus 07/21/2017   Prediabetes 06/29/2017   Soft tissue mass 06/14/2017   Displaced fracture of fifth metatarsal bone of right foot 02/07/2017   Unilateral primary osteoarthritis, left knee 11/26/2016   Unilateral primary osteoarthritis, right knee 11/26/2016   Left leg pain    Abnormal MRI of head    Vision changes 04/13/2016   Orthostatic hypotension 03/10/2016   Vertigo 03/10/2016   Depression 11/25/2015   Essential (primary) hypertension 11/25/2015    Hyperlipidemia 11/25/2015   Temporary cerebral vascular dysfunction 11/25/2015   Primary malignant neoplasm ([place]) 11/25/2015   Sleep apnea 11/25/2015   Chronic anticoagulation 08/04/2015   History of anticoagulant therapy 08/04/2015   Pain in limb 12/26/2013   Chest pain 11/27/2013   GERD - post failed open Nissen 11/23/2012   Dysphagia, unspecified(787.20) 09/28/2012   Dysphagia 09/28/2012   S/P Open Nissen 1998 Dr. 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   Lupus anticoagulant positive 06/01/2011   Chest pain    Hx-TIA (transient ischemic attack)    Hypothyroidism    Breast cancer ([place])    History of DVT (deep vein thrombosis)    History of antiphospholipid antibody syndrome    Past Medical History:  Diagnosis Date   Anxiety    severe panic attacks   Arthritis    Aseptic meningitis    Asthma    as child   Breast cancer ([place])    right   Chronic anticoagulation 08/04/2015   Collapsed lung    hx of, left   Complication of anesthesia    "hard to put asleep"   Depression    Fibromyalgia    GERD (gastroesophageal reflux disease)    H/O hiatal hernia    Headache(784.0)    Hepatitis 1990   "from eating at restaurant"   History of antiphospholipid antibody syndrome    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   Hx-TIA (transient ischemic attack)    Hyperlipidemia    Hypertension    Hypothyroidism    Neuromuscular disorder ([place])    Personal history of radiation therapy    Pneumonia    hx of   Soft tissue mass 06/14/2017   3 cm right post axilla same side as previous breast cancer 06/14/17   Stroke ([place])    Thrombosis, 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   Left digital artery  October 1998 - new dx antiphospholipid antibody syndrome    Family History  Problem Relation Age of Onset   Alzheimer's disease Mother    Heart disease Mother    Hyperlipidemia Mother     Hypertension Mother    Cancer Maternal Grandmother        colon   Heart disease Sister    Hypertension Sister    Heart attack Sister    Heart disease Brother    Heart attack Brother    Cancer Daughter    Hypertension Daughter    Hypertension Son     Past Surgical History:  Procedure Laterality Date   ABDOMINAL HYSTERECTOMY     BREAST LUMPECTOMY Right 2000   BREAST  SURGERY Right    x3   BUNIONECTOMY Bilateral 30 years ago   CARDIAC CATHETERIZATION  07/12/1997   NORMAL LEFT VENTRICULAR FUNCTION WITH EF AT LEAST 70-75%   ESOPHAGEAL MANOMETRY N/A 11/06/2012   Procedure: ESOPHAGEAL MANOMETRY (EM);  Surgeon: Pedro Earls, MD;  Location: [facility];  Service: General;  Laterality: N/A;   ESOPHAGOGASTRODUODENOSCOPY ENDOSCOPY     KNEE ARTHROSCOPY Right    LAPAROSCOPIC NISSEN FUNDOPLICATION N/A 6/94/3700   Procedure: LAPAROSCOPIC TAKEDOWN  PERI-HIATAL HERNIA    REPAIR;  Surgeon: Pedro Earls, MD;  [facility];  Service: General;  Laterality: N/A;   [place]  in 20's   TONSILLECTOMY  75 years old   UPPER GI ENDOSCOPY N/A 12/12/2012   Procedure: UPPER GI ENDOSCOPY;  Surgeon: Pedro Earls, MD;  [facility];  Service: General;  Laterality: N/A;   Social History   Occupational History   Not on file  Tobacco Use   Smoking status: Never   Smokeless tobacco: Never  Vaping Use   Vaping Use: Never used  Substance and Sexual Activity   Alcohol use: No    Alcohol/week: 0.0 standard drinks   Drug use: No   Sexual activity: Not on file

## 2021-01-20 ENCOUNTER — Encounter: Payer: Self-pay | Admitting: Family

## 2021-01-27 ENCOUNTER — Ambulatory Visit: Payer: Medicare HMO | Admitting: Orthopedic Surgery

## 2021-03-25 ENCOUNTER — Emergency Department (HOSPITAL_BASED_OUTPATIENT_CLINIC_OR_DEPARTMENT_OTHER)
Admission: EM | Admit: 2021-03-25 | Discharge: 2021-03-26 | Disposition: A | Discharge status: Home / Self Care | Payer: Medicare HMO | Attending: Emergency Medicine | Admitting: Emergency Medicine

## 2021-03-25 ENCOUNTER — Other Ambulatory Visit: Payer: Self-pay

## 2021-03-25 ENCOUNTER — Emergency Department (HOSPITAL_BASED_OUTPATIENT_CLINIC_OR_DEPARTMENT_OTHER): Payer: Medicare HMO

## 2021-03-25 ENCOUNTER — Encounter (HOSPITAL_BASED_OUTPATIENT_CLINIC_OR_DEPARTMENT_OTHER): Payer: Self-pay | Admitting: *Deleted

## 2021-03-25 DIAGNOSIS — Z7982 Long term (current) use of aspirin: Secondary | ICD-10-CM | POA: Insufficient documentation

## 2021-03-25 DIAGNOSIS — Z79899 Other long term (current) drug therapy: Secondary | ICD-10-CM | POA: Diagnosis not present

## 2021-03-25 DIAGNOSIS — I6521 Occlusion and stenosis of right carotid artery: Secondary | ICD-10-CM

## 2021-03-25 DIAGNOSIS — M25551 Pain in right hip: Secondary | ICD-10-CM | POA: Diagnosis not present

## 2021-03-25 DIAGNOSIS — J45909 Unspecified asthma, uncomplicated: Secondary | ICD-10-CM | POA: Diagnosis not present

## 2021-03-25 DIAGNOSIS — Z7901 Long term (current) use of anticoagulants: Secondary | ICD-10-CM | POA: Insufficient documentation

## 2021-03-25 DIAGNOSIS — Z853 Personal history of malignant neoplasm of breast: Secondary | ICD-10-CM | POA: Insufficient documentation

## 2021-03-25 DIAGNOSIS — E039 Hypothyroidism, unspecified: Secondary | ICD-10-CM | POA: Diagnosis not present

## 2021-03-25 DIAGNOSIS — W19XXXA Unspecified fall, initial encounter: Secondary | ICD-10-CM

## 2021-03-25 DIAGNOSIS — Z85828 Personal history of other malignant neoplasm of skin: Secondary | ICD-10-CM | POA: Insufficient documentation

## 2021-03-25 DIAGNOSIS — R519 Headache, unspecified: Secondary | ICD-10-CM

## 2021-03-25 DIAGNOSIS — M25511 Pain in right shoulder: Secondary | ICD-10-CM

## 2021-03-25 DIAGNOSIS — M542 Cervicalgia: Secondary | ICD-10-CM

## 2021-03-25 DIAGNOSIS — R42 Dizziness and giddiness: Secondary | ICD-10-CM | POA: Diagnosis not present

## 2021-03-25 DIAGNOSIS — W182XXA Fall in (into) shower or empty bathtub, initial encounter: Secondary | ICD-10-CM | POA: Diagnosis not present

## 2021-03-25 DIAGNOSIS — I10 Essential (primary) hypertension: Secondary | ICD-10-CM | POA: Diagnosis not present

## 2021-03-25 LAB — CBC WITH DIFFERENTIAL/PLATELET
Abs Immature Granulocytes: 0.05 10*3/uL (ref 0.00–0.07)
Basophils Absolute: 0.1 10*3/uL (ref 0.0–0.1)
Basophils Relative: 1 %
Eosinophils Absolute: 0.2 10*3/uL (ref 0.0–0.5)
Eosinophils Relative: 3 %
HCT: 40.1 % (ref 36.0–46.0)
Hemoglobin: 13.2 g/dL (ref 12.0–15.0)
Immature Granulocytes: 1 %
Lymphocytes Relative: 31 %
Lymphs Abs: 2.8 10*3/uL (ref 0.7–4.0)
MCH: 29 pg (ref 26.0–34.0)
MCHC: 32.9 g/dL (ref 30.0–36.0)
MCV: 88.1 fL (ref 80.0–100.0)
Monocytes Absolute: 0.6 10*3/uL (ref 0.1–1.0)
Monocytes Relative: 6 %
Neutro Abs: 5.2 10*3/uL (ref 1.7–7.7)
Neutrophils Relative %: 58 %
Platelets: 228 10*3/uL (ref 150–400)
RBC: 4.55 MIL/uL (ref 3.87–5.11)
RDW: 15.1 % (ref 11.5–15.5)
WBC: 8.8 10*3/uL (ref 4.0–10.5)
nRBC: 0 % (ref 0.0–0.2)

## 2021-03-25 NOTE — ED Notes (Signed)
Patient transported to X-ray 

## 2021-03-25 NOTE — ED Notes (Signed)
Patient reports having head, neck, bilateral shoulder, and right hip pain. Patient reports being on coumadin, and having unsteady gait since falling in shower on Sunday. Dr. Gustavus Messing made aware.

## 2021-03-25 NOTE — ED Triage Notes (Signed)
[place] in the shower this past Sunday.  Patient stated that she is unsteady since the fall.

## 2021-03-25 NOTE — ED Provider Notes (Signed)
[name] Provider Note   CSN: [id number] Arrival date & time: 03/25/21  1827     History Chief Complaint  Patient presents with   Walker Kehr Page Monte is a 75 y.o. female.  The history is provided by the patient and medical records. No language interpreter was used.  Fall This is a new problem. The current episode started more than 2 days ago. The problem occurs rarely. The problem has been resolved. Associated symptoms include headaches. Pertinent negatives include no chest pain, no abdominal pain and no shortness of breath. Nothing aggravates the symptoms. Nothing relieves the symptoms. She has tried nothing for the symptoms. The treatment provided no relief.      Past Medical History:  Diagnosis Date   Anxiety    severe panic attacks   Arthritis    Aseptic meningitis    Asthma    as child   Breast cancer ([place])    right   Chronic anticoagulation 08/04/2015   Collapsed lung    hx of, left   Complication of anesthesia    "hard to put asleep"   Depression    Fibromyalgia    GERD (gastroesophageal reflux disease)    H/O hiatal hernia    Headache(784.0)    Hepatitis 1990   "from eating at restaurant"   History of antiphospholipid antibody syndrome    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   Hx-TIA (transient ischemic attack)    Hyperlipidemia    Hypertension    Hypothyroidism    Neuromuscular disorder ([place])    Personal history of radiation therapy    Pneumonia    hx of   Soft tissue mass 06/14/2017   3 cm right post axilla same side as previous breast cancer 06/14/17   Stroke ([place])    Thrombosis, 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   Left digital artery  October 1998 - new dx antiphospholipid antibody syndrome    Patient Active Problem List   Diagnosis Date Noted   Bilateral primary osteoarthritis of knee 12/27/2017   Acute infection of nasal sinus 07/21/2017   Prediabetes 06/29/2017   Soft tissue  mass 06/14/2017   Displaced fracture of fifth metatarsal bone of right foot 02/07/2017   Unilateral primary osteoarthritis, left knee 11/26/2016   Unilateral primary osteoarthritis, right knee 11/26/2016   Left leg pain    Abnormal MRI of head    Vision changes 04/13/2016   Orthostatic hypotension 03/10/2016   Vertigo 03/10/2016   Depression 11/25/2015   Essential (primary) hypertension 11/25/2015   Hyperlipidemia 11/25/2015   Temporary cerebral vascular dysfunction 11/25/2015   Primary malignant neoplasm ([place]) 11/25/2015   Sleep apnea 11/25/2015   Chronic anticoagulation 08/04/2015   History of anticoagulant therapy 08/04/2015   Pain in limb 12/26/2013   Chest pain 11/27/2013   GERD - post failed open Nissen 11/23/2012   Dysphagia, unspecified(787.20) 09/28/2012   Dysphagia 09/28/2012   S/P Open Nissen 1998 Dr. 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   Antiphospholipid antibody syndrome ([place]) 06/01/2011   Lupus anticoagulant positive 06/01/2011   Chest pain    Hx-TIA (transient ischemic attack)    Hypothyroidism    Breast cancer ([place])    History of DVT (deep vein thrombosis)    History of antiphospholipid antibody syndrome     Past Surgical History:  Procedure Laterality Date   ABDOMINAL HYSTERECTOMY     BREAST LUMPECTOMY Right 2000  BREAST SURGERY Right    x3   BUNIONECTOMY Bilateral 30 years ago   CARDIAC CATHETERIZATION  07/12/1997   NORMAL LEFT VENTRICULAR FUNCTION WITH EF AT LEAST 70-75%   ESOPHAGEAL MANOMETRY N/A 11/06/2012   Procedure: ESOPHAGEAL MANOMETRY (EM);  Surgeon: Pedro Earls, MD;  Location: [facility];  Service: General;  Laterality: N/A;   ESOPHAGOGASTRODUODENOSCOPY ENDOSCOPY     KNEE ARTHROSCOPY Right    LAPAROSCOPIC NISSEN FUNDOPLICATION N/A [id number]   Procedure: LAPAROSCOPIC TAKEDOWN  PERI-HIATAL HERNIA    REPAIR;  Surgeon: Pedro Earls, MD;  Location: [facility];  Service: General;  Laterality: N/A;   [place]  in 20's   TONSILLECTOMY  75 years old   UPPER GI ENDOSCOPY N/A 12/12/2012   Procedure: UPPER GI ENDOSCOPY;  Surgeon: Pedro Earls, MD;  Location: [facility];  Service: General;  Laterality: N/A;     OB History     Gravida  3   Para  3   Term      Preterm      AB      Living         SAB      IAB      Ectopic      Multiple      Live Births              Family History  Problem Relation Age of Onset   Alzheimer's disease Mother    Heart disease Mother    Hyperlipidemia Mother    Hypertension Mother    Cancer Maternal Grandmother        colon   Heart disease Sister    Hypertension Sister    Heart attack Sister    Heart disease Brother    Heart attack Brother    Cancer Daughter    Hypertension Daughter    Hypertension Son     Social History   Tobacco Use   Smoking status: Never   Smokeless tobacco: Never  Vaping Use   Vaping Use: Never used  Substance Use Topics   Alcohol use: No    Alcohol/week: 0.0 standard drinks   Drug use: No    Home Medications Prior to Admission medications   Medication Sig Start Date End Date Taking? Authorizing Provider  ALPRAZolam [name]) 1 MG tablet Take 1 mg by mouth 2 (two) times daily.  12/22/19  Yes [provider]  Ascorbic Acid (VITAMIN C) 1000 MG tablet Take 1,000 mg by mouth daily.   Yes [provider]  aspirin 81 MG tablet Take 81 mg by mouth every evening.   Yes [provider]  CALCIUM CARBONATE PO Take 1 tablet by mouth daily.   Yes [provider]  Cyanocobalamin (VITAMIN B 12 PO) Take 1 tablet by mouth daily.    Yes [provider]  HYDROcodone-acetaminophen (NORCO/VICODIN) 5-325 MG tablet Take 1 tablet by mouth every 6 (six) hours as needed for severe pain. 12/03/20  Yes Dondra Prader R, NP  levothyroxine (SYNTHROID, LEVOTHROID) 112 MCG tablet Take 112 mcg by mouth as directed. Take Only on (Sun,  Tues, Wed, Thurs, Sat)   Yes [provider]  magnesium oxide (MAG-OX) 400 MG tablet Take 400 mg by mouth daily.   Yes [provider]  metoprolol succinate (TOPROL-XL) 25 MG 24 hr tablet Take 12.5 mg by mouth 2 (two) times daily.   Yes [provider]  omeprazole ([place])  20 MG capsule Take 20 mg by mouth daily.  02/06/13  Yes [provider]  pravastatin (PRAVACHOL) 40 MG tablet Take 40 mg by mouth at bedtime. 01/15/20  Yes [provider]  sertraline (ZOLOFT) 50 MG tablet Take 50 mg by mouth daily. 01/20/20  Yes [provider]  warfarin (COUMADIN) 5 MG tablet Take 5 mg daily except on Mondays: take 2.5 mg (1/2 tablet) Patient taking differently: Take 1.5 tablets (7.5 mg) on (Mon & Fri) & Take 1 tablet (5 mg) all other days 05/11/18  Yes Annia Belt, MD  fluticasone (FLONASE) 50 MCG/ACT nasal spray Place 2 sprays into the nose daily as needed for allergies.  10/16/14   [provider]  levothyroxine (SYNTHROID, LEVOTHROID) 125 MCG tablet Take 125 mcg by mouth as directed. Take Only (Mon & Fri) 06/24/16   [provider]    Allergies    Bee venom, Hornet venom, Reglan [metoclopramide], Vitamin k, Baclofen, Cephalexin, Duloxetine, Benadryl [diphenhydramine hcl], Ciprofloxacin, Crestor [rosuvastatin calcium], Cymbalta [duloxetine hcl], Daypro [oxaprozin], Doxycycline, Levofloxacin, Sulfonamide derivatives, and Zocor [simvastatin]  Review of Systems   Review of Systems  Constitutional:  Negative for chills, fatigue and fever.  Eyes:  Negative for photophobia and visual disturbance.  Respiratory:  Negative for cough, chest tightness and shortness of breath.   Cardiovascular:  Negative for chest pain.  Gastrointestinal:  Negative for abdominal pain, constipation, diarrhea, nausea and vomiting.  Genitourinary:  Negative for flank pain.  Musculoskeletal:  Negative for back pain and neck pain.  Skin:  Negative for rash  and wound.  Neurological:  Positive for dizziness and headaches. Negative for syncope, weakness, light-headedness and numbness.  Psychiatric/Behavioral:  Negative for agitation and confusion.   All other systems reviewed and are negative.  Physical Exam Updated Vital Signs BP (!) 158/87 (BP Location: Left Arm)   Pulse 66   Temp 98.7 F (37.1 C)   Resp 19   Ht 5' 5.5" (1.664 m)   Wt 74.8 kg   SpO2 99%   BMI 27.04 kg/m   Physical Exam Vitals and nursing note reviewed.  Constitutional:      General: She is not in acute distress.    Appearance: She is well-developed. She is not ill-appearing, toxic-appearing or diaphoretic.  HENT:     Head: Normocephalic and atraumatic.  Eyes:     Conjunctiva/sclera: Conjunctivae normal.  Neck:     Vascular: No carotid bruit.   Cardiovascular:     Rate and Rhythm: Normal rate and regular rhythm.     Heart sounds: No murmur heard. Pulmonary:     Effort: Pulmonary effort is normal.     Breath sounds: Normal breath sounds. No wheezing, rhonchi or rales.  Chest:     Chest wall: No tenderness.  Abdominal:     General: Abdomen is flat.     Palpations: Abdomen is soft.     Tenderness: There is no abdominal tenderness. There is no left CVA tenderness, guarding or rebound.  Musculoskeletal:        General: Tenderness present.     Right shoulder: Tenderness present.     Cervical back: Neck supple. Tenderness present. Muscular tenderness present. No spinous process tenderness.     Right hip: Tenderness present.     Comments: Intact sensation and strength and pulses in extremities.  Tenderness in the right shoulder and right hip.  Skin:    General: Skin is warm and dry.     Capillary Refill: Capillary refill takes  less than 2 seconds.     Findings: No erythema or rash.  Neurological:     Mental Status: She is alert.     Sensory: No sensory deficit.     Motor: No weakness.  Psychiatric:        Mood and Affect: Mood normal.     ED Results /  Procedures / Treatments   Labs (all labs ordered are listed, but only abnormal results are displayed) Labs Reviewed  COMPREHENSIVE METABOLIC PANEL - Abnormal; Notable for the following components:      Result Value   Sodium 134 (*)    Chloride 96 (*)    Glucose, Bld 107 (*)    Creatinine, Ser 1.03 (*)    GFR, Estimated 57 (*)    All other components within normal limits  PROTIME-INR - Abnormal; Notable for the following components:   Prothrombin Time 32.0 (*)    INR 3.1 (*)    All other components within normal limits  CBC WITH DIFFERENTIAL/PLATELET    EKG None  Radiology DG Shoulder Right  Result Date: 03/25/2021 CLINICAL DATA:  Fall EXAM: RIGHT SHOULDER - 2+ VIEW COMPARISON:  None. FINDINGS: No fracture or dislocation is seen. Mild degenerative changes of the acromioclavicular joint. Visualized soft tissues are within normal limits. Visualized right lung is clear. IMPRESSION: Negative. Electronically Signed   By: Julian Hy M.D.   On: 03/25/2021 23:28   DG Hip Unilat W or Wo Pelvis 2-3 Views Right  Result Date: 03/25/2021 CLINICAL DATA:  Fall EXAM: DG HIP (WITH OR WITHOUT PELVIS) 2-3V RIGHT COMPARISON:  None. FINDINGS: No fracture or dislocation is seen. Bilateral hip joint spaces are preserved. Visualized bony pelvis appears intact. Degenerative changes of the lower lumbar spine. IMPRESSION: Negative. Electronically Signed   By: Julian Hy M.D.   On: 03/25/2021 23:28    Procedures Procedures   Medications Ordered in ED Medications  iohexol (OMNIPAQUE) 350 MG/ML injection 100 mL (60 mLs Intravenous Contrast Given 03/26/21 0050)    ED Course  I have reviewed the triage vital signs and the nursing notes.  Pertinent labs & imaging results that were available during my care of the patient were reviewed by me and considered in my medical decision making (see chart for details).    MDM Rules/Calculators/A&P                           [name] is a 75  y.o. female with a past medical history significant for antiphospholipid antibody syndrome on chronic anticoagulation, prior DVT, prior breast cancer, hypothyroidism, vertigo, hyperlipidemia, hypertension, asthma, depression, prior stroke, and fibromyalgia who presents with fall.  According to patient, she was getting in the bathtub 4 days ago when the adhesive mat slipped out causing her to fall.  She reported hitting her right neck and right occiput on the ground as well as injuring her right shoulder and her right hip.  She reports that since the fall, she has had severe right posterior headache and neck pain especially with movement.  She reports no vision changes or speech difficulties but reports she has felt unsteady on her feet.  This feels different than the vertigo she has had in the past.  She reports no unilateral numbness, tingling, or weakness.  She reports pain in her right shoulder and her right hip but otherwise has been able to ambulate.  She denies any loss of bowel or bladder control, chest pain, abdominal  pain, or other back pain.  She reports the pain is moderate.  On exam, lungs are clear and chest is nontender.  Right paraspinal neck is tender to palpation as is the right occiput where she has a small hematoma and bump.  No laceration seen.  Patient has tenderness in the right hip but pelvis felt stable.  Abdomen otherwise nontender.  Chest nontender.  Patient had normal sensation and strength in extremities at her baseline.  Clinically I am concerned that we need to rule out both intracranial hemorrhage as well as dissection given the direct trauma to her neck with the pain going from her neck into her head with some unsteadiness and dizziness reported.  As the symptoms could be posterior circulation related, will get CTA of the head and neck to look for abnormality.  We will also get imaging of the right shoulder and the right hip.  If work-up is reassuring and she is able to safely  ambulate, anticipate discharge.  Care transferred to oncoming team to await results of work-up.   Final Clinical Impression(s) / ED Diagnoses Final diagnoses:  Fall, initial encounter  Acute nonintractable headache, unspecified headache type  Neck pain  Acute pain of right shoulder  Right hip pain  Dizziness     Clinical Impression: 1. Fall, initial encounter   2. Acute nonintractable headache, unspecified headache type   3. Neck pain   4. Acute pain of right shoulder   5. Right hip pain   6. Dizziness     Disposition: Care transferred to oncoming team while awaiting results of work-up.  Anticipate discharge if work-up is reassuring   This note was prepared with assistance of Dragon voice recognition software. Occasional wrong-word or sound-a-like substitutions may have occurred due to the inherent limitations of voice recognition software.     Frederich Montilla, Gwenyth Allegra, MD 03/26/21 [phone]

## 2021-03-26 ENCOUNTER — Encounter (HOSPITAL_BASED_OUTPATIENT_CLINIC_OR_DEPARTMENT_OTHER): Payer: Self-pay | Admitting: Radiology

## 2021-03-26 LAB — COMPREHENSIVE METABOLIC PANEL
ALT: 22 U/L (ref 0–44)
AST: 33 U/L (ref 15–41)
Albumin: 4.7 g/dL (ref 3.5–5.0)
Alkaline Phosphatase: 88 U/L (ref 38–126)
Anion gap: 11 (ref 5–15)
BUN: 22 mg/dL (ref 8–23)
CO2: 27 mmol/L (ref 22–32)
Calcium: 9.9 mg/dL (ref 8.9–10.3)
Chloride: 96 mmol/L — ABNORMAL LOW (ref 98–111)
Creatinine, Ser: 1.03 mg/dL — ABNORMAL HIGH (ref 0.44–1.00)
GFR, Estimated: 57 mL/min — ABNORMAL LOW (ref >60)
Glucose, Bld: 107 mg/dL — ABNORMAL HIGH (ref 70–99)
Potassium: 4.4 mmol/L (ref 3.5–5.1)
Sodium: 134 mmol/L — ABNORMAL LOW (ref 135–145)
Total Bilirubin: 0.8 mg/dL (ref 0.3–1.2)
Total Protein: 8.1 g/dL (ref 6.5–8.1)

## 2021-03-26 LAB — PROTIME-INR
INR: 3.1 — ABNORMAL HIGH (ref 0.8–1.2)
Prothrombin Time: 32 seconds — ABNORMAL HIGH (ref 11.4–15.2)

## 2021-03-26 MED ORDER — IOHEXOL 350 MG/ML SOLN
100.0000 mL | Freq: Once | INTRAVENOUS | Status: AC | PRN
  Administered 2021-03-26: 60 mL via INTRAVENOUS

## 2021-03-26 NOTE — Discharge Instructions (Addendum)
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  This could lead to strokes.  Please follow-up with the vascular surgeon to see if there is any treatment recommended for it.

## 2021-03-26 NOTE — ED Provider Notes (Signed)
Care assumed from Dr. Sherry Ruffing, patient with fall 2 days ago with injury to right hip and right shoulder.  Hip and shoulder x-rays are negative.  She is pending CT angiogram of head and neck.  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  Patient is advised of this finding and she is referred to vascular surgery for outpatient follow-up.  She is advised on applying ice to sore areas, told to use acetaminophen as needed for pain.  Results for orders placed or performed during the hospital encounter of 03/25/21  CBC with Differential  Result Value Ref Range   WBC 8.8 4.0 - 10.5 K/uL   RBC 4.55 3.87 - 5.11 MIL/uL   Hemoglobin 13.2 12.0 - 15.0 g/dL   HCT 40.1 36.0 - 46.0 %   MCV 88.1 80.0 - 100.0 fL   MCH 29.0 26.0 - 34.0 pg   MCHC 32.9 30.0 - 36.0 g/dL   RDW 15.1 11.5 - 15.5 %   Platelets 228 150 - 400 K/uL   nRBC 0.0 0.0 - 0.2 %   Neutrophils Relative % 58 %   Neutro Abs 5.2 1.7 - 7.7 K/uL   Lymphocytes Relative 31 %   Lymphs Abs 2.8 0.7 - 4.0 K/uL   Monocytes Relative 6 %   Monocytes Absolute 0.6 0.1 - 1.0 K/uL   Eosinophils Relative 3 %   Eosinophils Absolute 0.2 0.0 - 0.5 K/uL   Basophils Relative 1 %   Basophils Absolute 0.1 0.0 - 0.1 K/uL   Immature Granulocytes 1 %   Abs Immature Granulocytes 0.05 0.00 - 0.07 K/uL  Comprehensive metabolic panel  Result Value Ref Range   Sodium 134 (L) 135 - 145 mmol/L   Potassium 4.4 3.5 - 5.1 mmol/L   Chloride 96 (L) 98 - 111 mmol/L   CO2 27 22 - 32 mmol/L   Glucose, Bld 107 (H) 70 - 99 mg/dL   BUN 22 8 - 23 mg/dL   Creatinine, Ser 1.03 (H) 0.44 - 1.00 mg/dL   Calcium 9.9 8.9 - 10.3 mg/dL   Total Protein 8.1 6.5 - 8.1 g/dL   Albumin 4.7 3.5 - 5.0 g/dL   AST 33 15 - 41 U/L   ALT 22 0 - 44 U/L   Alkaline Phosphatase 88 38 - 126 U/L   Total Bilirubin PENDING 0.3 - 1.2 mg/dL   GFR, Estimated 57 (L) >60 mL/min   Anion gap 11 5 - 15   Protime-INR  Result Value Ref Range   Prothrombin Time 32.0 (H) 11.4 - 15.2 seconds   INR 3.1 (H) 0.8 - 1.2   CT Angio Head W or Wo Contrast  Result Date: 03/26/2021 CLINICAL DATA:  Dizziness EXAM: CT ANGIOGRAPHY HEAD AND NECK TECHNIQUE: Multidetector CT imaging of the head and neck was performed using the standard protocol during bolus administration of intravenous contrast. Multiplanar CT image reconstructions and MIPs were obtained to evaluate the vascular anatomy. Carotid stenosis measurements (when applicable) are obtained utilizing NASCET criteria, using the distal internal carotid diameter as the denominator. CONTRAST:  48m OMNIPAQUE IOHEXOL 350 MG/ML SOLN COMPARISON:  None. FINDINGS: CT HEAD FINDINGS Brain: There is no mass, hemorrhage or extra-axial collection. The size and configuration of the ventricles and extra-axial CSF spaces are normal. There is no acute or chronic infarction. The brain parenchyma is normal. Skull: The visualized skull base, calvarium and extracranial soft tissues are normal. Sinuses/Orbits: Opacification of the  right maxillary sinus. The orbits are normal. CTA NECK FINDINGS SKELETON: There is no bony spinal canal stenosis. No lytic or blastic lesion. OTHER NECK: Normal pharynx, larynx and major salivary glands. No cervical lymphadenopathy. Unremarkable thyroid gland. UPPER CHEST: No pneumothorax or pleural effusion. No nodules or masses. AORTIC ARCH: There is no calcific atherosclerosis of the aortic arch. There is no aneurysm, dissection or hemodynamically significant stenosis of the visualized portion of the aorta. Conventional 3 vessel aortic branching pattern. The visualized proximal subclavian arteries are widely patent. 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 This is secondary to low density plaque. The distal ICA is patent and of normal caliber. LEFT CAROTID SYSTEM: Normal without  aneurysm, dissection or stenosis. VERTEBRAL ARTERIES: Left dominant configuration. Both origins are clearly patent. There is no dissection, occlusion or flow-limiting stenosis to the skull base (V1-V3 segments). CTA HEAD FINDINGS POSTERIOR CIRCULATION: --Vertebral arteries: Normal V4 segments. --Inferior cerebellar arteries: Normal. --Basilar artery: Normal. --Superior cerebellar arteries: Normal. --Posterior cerebral arteries (PCA): Normal. ANTERIOR CIRCULATION: --Intracranial internal carotid arteries: Normal. --Anterior cerebral arteries (ACA): Normal. Both A1 segments are present. Patent anterior communicating artery (a-comm). --Middle cerebral arteries (MCA): Normal. VENOUS SINUSES: As permitted by contrast timing, patent. ANATOMIC VARIANTS: None Review of the MIP images confirms the above findings. IMPRESSION: 1. No intracranial arterial occlusion or high-grade stenosis. 2. 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 This may be a source of emboli. Electronically Signed   By: Ulyses Jarred M.D.   On: 03/26/2021 01:28   DG Shoulder Right  Result Date: 03/25/2021 CLINICAL DATA:  Fall EXAM: RIGHT SHOULDER - 2+ VIEW COMPARISON:  None. FINDINGS: No fracture or dislocation is seen. Mild degenerative changes of the acromioclavicular joint. Visualized soft tissues are within normal limits. Visualized right lung is clear. IMPRESSION: Negative. Electronically Signed   By: Julian Hy M.D.   On: 03/25/2021 23:28   CT Angio Neck W and/or Wo Contrast  Result Date: 03/26/2021 CLINICAL DATA:  Dizziness EXAM: CT ANGIOGRAPHY HEAD AND NECK TECHNIQUE: Multidetector CT imaging of the head and neck was performed using the standard protocol during bolus administration of intravenous contrast. Multiplanar CT image reconstructions and MIPs were obtained to evaluate the vascular anatomy. Carotid stenosis measurements (when applicable) are obtained  utilizing NASCET criteria, using the distal internal carotid diameter as the denominator. CONTRAST:  24m OMNIPAQUE IOHEXOL 350 MG/ML SOLN COMPARISON:  None. FINDINGS: CT HEAD FINDINGS Brain: There is no mass, hemorrhage or extra-axial collection. The size and configuration of the ventricles and extra-axial CSF spaces are normal. There is no acute or chronic infarction. The brain parenchyma is normal. Skull: The visualized skull base, calvarium and extracranial soft tissues are normal. Sinuses/Orbits: Opacification of the right maxillary sinus. The orbits are normal. CTA NECK FINDINGS SKELETON: There is no bony spinal canal stenosis. No lytic or blastic lesion. OTHER NECK: Normal pharynx, larynx and major salivary glands. No cervical lymphadenopathy. Unremarkable thyroid gland. UPPER CHEST: No pneumothorax or pleural effusion. No nodules or masses. AORTIC ARCH: There is no calcific atherosclerosis of the aortic arch. There is no aneurysm, dissection or hemodynamically significant stenosis of the visualized portion of the aorta. Conventional 3 vessel aortic branching pattern. The visualized proximal subclavian arteries are widely patent. 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 This is secondary to low density plaque. The distal ICA  is patent and of normal caliber. LEFT CAROTID SYSTEM: Normal without aneurysm, dissection or stenosis. VERTEBRAL ARTERIES: Left dominant configuration. Both origins are clearly patent. There is no dissection, occlusion or flow-limiting stenosis to the skull base (V1-V3 segments). CTA HEAD FINDINGS POSTERIOR CIRCULATION: --Vertebral arteries: Normal V4 segments. --Inferior cerebellar arteries: Normal. --Basilar artery: Normal. --Superior cerebellar arteries: Normal. --Posterior cerebral arteries (PCA): Normal. ANTERIOR CIRCULATION: --Intracranial internal carotid arteries: Normal. --Anterior cerebral arteries  (ACA): Normal. Both A1 segments are present. Patent anterior communicating artery (a-comm). --Middle cerebral arteries (MCA): Normal. VENOUS SINUSES: As permitted by contrast timing, patent. ANATOMIC VARIANTS: None Review of the MIP images confirms the above findings. IMPRESSION: 1. No intracranial arterial occlusion or high-grade stenosis. 2. 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 This may be a source of emboli. Electronically Signed   By: Ulyses Jarred M.D.   On: 03/26/2021 01:28   DG Hip Unilat W or Wo Pelvis 2-3 Views Right  Result Date: 03/25/2021 CLINICAL DATA:  Fall EXAM: DG HIP (WITH OR WITHOUT PELVIS) 2-3V RIGHT COMPARISON:  None. FINDINGS: No fracture or dislocation is seen. Bilateral hip joint spaces are preserved. Visualized bony pelvis appears intact. Degenerative changes of the lower lumbar spine. IMPRESSION: Negative. Electronically Signed   By: Julian Hy M.D.   On: [phone] [id number]      Delora Fuel, MD [id number] [phone]

## 2021-04-01 ENCOUNTER — Other Ambulatory Visit: Payer: Self-pay

## 2021-04-01 DIAGNOSIS — I6529 Occlusion and stenosis of unspecified carotid artery: Secondary | ICD-10-CM

## 2021-04-03 ENCOUNTER — Encounter: Payer: Self-pay | Admitting: Vascular Surgery

## 2021-04-03 ENCOUNTER — Ambulatory Visit (INDEPENDENT_AMBULATORY_CARE_PROVIDER_SITE_OTHER): Payer: Medicare HMO | Admitting: Vascular Surgery

## 2021-04-03 ENCOUNTER — Telehealth: Payer: Self-pay | Admitting: Cardiology

## 2021-04-03 ENCOUNTER — Other Ambulatory Visit: Payer: Self-pay

## 2021-04-03 ENCOUNTER — Ambulatory Visit (HOSPITAL_COMMUNITY)
Admission: RE | Admit: 2021-04-03 | Discharge: 2021-04-03 | Disposition: A | Discharge status: Home / Self Care | Payer: Medicare HMO | Source: Ambulatory Visit | Attending: Vascular Surgery | Admitting: Vascular Surgery

## 2021-04-03 VITALS — BP 151/79 | HR 61 | Temp 97.2°F | Resp 20 | Ht 65.5 in | Wt 176.0 lb

## 2021-04-03 DIAGNOSIS — I6529 Occlusion and stenosis of unspecified carotid artery: Secondary | ICD-10-CM | POA: Diagnosis not present

## 2021-04-03 DIAGNOSIS — I6521 Occlusion and stenosis of right carotid artery: Secondary | ICD-10-CM

## 2021-04-03 MED ORDER — ENOXAPARIN SODIUM 80 MG/0.8ML IJ SOSY: 80.0000 mg | PREFILLED_SYRINGE | Freq: Two times a day (BID) | INTRAMUSCULAR | 0 refills | Status: DC

## 2021-04-03 NOTE — Telephone Encounter (Signed)
Pt returned the call and has been scheduled to see Doreene Adas, PA-C, 04/13/2021 and clearance will be addressed at that time.  Will route back to the requesting surgeons' office to make them aware.

## 2021-04-03 NOTE — H&P (View-Only) (Signed)
Office Note     CC:  Asymptomatic carotid stenosis  Requesting Provider:  [name]., PA-C  HPI: [name] is a 75 y.o. ([birth year]) female who presents to clinic at the request of her PCP for incidental finding of right internal carotid artery stenosis.  Few weeks ago, [name] fell in the shower due to the bath mat slipping out from under her.  She was immediately taken to the hospital and underwent work-up for cerebral hemorrhage as she hit her head on anticoagulation, which she takes due to being antiphospholipid antibody positive.  CT angiogram at that time demonstrated incidental finding of right internal carotid artery stenosis.  On exam, and was doing well.  She denied history of TIA, stroke, amaurosis.  She was seen in the vascular surgery outpatient clinic years ago however did not have the stenosis present.  She denies dizziness, unsteady gait.  Surgical history includes total thyroidectomy, Nissen, lumpectomy.  [place] is the primary caregiver for her mother who was in her 49s and has dementia.  The pt is on a statin for cholesterol management.  The pt is on a daily aspirin.   Other AC:  warfarin - Anti Phos antibody The pt is on metoprolol for hypertension.   The pt is not diabetic.   Tobacco hx:  none  Past Medical History:  Diagnosis Date   Anxiety    severe panic attacks   Arthritis    Aseptic meningitis    Asthma    as child   Breast cancer ([place])    right   Chronic anticoagulation 08/04/2015   Collapsed lung    hx of, left   Complication of anesthesia    "hard to put asleep"   Depression    Fibromyalgia    GERD (gastroesophageal reflux disease)    H/O hiatal hernia    Headache(784.0)    Hepatitis 1990   "from eating at restaurant"   History of antiphospholipid antibody syndrome    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   Hx-TIA (transient ischemic attack)    Hyperlipidemia    Hypertension    Hypothyroidism     Neuromuscular disorder ([place])    Personal history of radiation therapy    Pneumonia    hx of   Soft tissue mass 06/14/2017   3 cm right post axilla same side as previous breast cancer 06/14/17   Stroke ([place])    Thrombosis, 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   Left digital artery  October 1998 - new dx antiphospholipid antibody syndrome    Past Surgical History:  Procedure Laterality Date   ABDOMINAL HYSTERECTOMY     BREAST LUMPECTOMY Right 2000   BREAST SURGERY Right    x3   BUNIONECTOMY Bilateral 30 years ago   CARDIAC CATHETERIZATION  07/12/1997   NORMAL LEFT VENTRICULAR FUNCTION WITH EF AT LEAST 70-75%   ESOPHAGEAL MANOMETRY N/A 11/06/2012   Procedure: ESOPHAGEAL MANOMETRY (EM);  Surgeon: Pedro Earls, MD;  Location: [facility];  Service: General;  Laterality: N/A;   ESOPHAGOGASTRODUODENOSCOPY ENDOSCOPY     KNEE ARTHROSCOPY Right    LAPAROSCOPIC NISSEN FUNDOPLICATION N/A 123456   Procedure: LAPAROSCOPIC TAKEDOWN  PERI-HIATAL HERNIA    REPAIR;  Surgeon: Pedro Earls, MD;  Location: [facility];  Service: General;  Laterality: N/A;   [place]  in 20's   TONSILLECTOMY  75 years old   UPPER GI  ENDOSCOPY N/A 12/12/2012   Procedure: UPPER GI ENDOSCOPY;  Surgeon: Pedro Earls, MD;  Location: [facility];  Service: General;  Laterality: N/A;    Social History   Socioeconomic History   Marital status: Single    Spouse name: Not on file   Number of children: 2   Years of education: 11   Highest education level: Not on file  Occupational History   Not on file  Tobacco Use   Smoking status: Never   Smokeless tobacco: Never  Vaping Use   Vaping Use: Never used  Substance and Sexual Activity   Alcohol use: No    Alcohol/week: 0.0 standard drinks   Drug use: No   Sexual activity: Never  Other Topics Concern   Not on file  Social History Narrative   Lives at home with her mother.  She is her mother's primary  caregiver due to dementia.   Right-handed.   2-4 cups caffeine daily.   Social Determinants of Health   Financial Resource Strain: Not on file  Food Insecurity: Not on file  Transportation Needs: Not on file  Physical Activity: Not on file  Stress: Not on file  Social Connections: Not on file  Intimate Partner Violence: Not on file    Family History  Problem Relation Age of Onset   Alzheimer's disease Mother    Heart disease Mother    Hyperlipidemia Mother    Hypertension Mother    Cancer Maternal Grandmother        colon   Heart disease Sister    Hypertension Sister    Heart attack Sister    Heart disease Brother    Heart attack Brother    Cancer Daughter    Hypertension Daughter    Hypertension Son     Current Outpatient Medications  Medication Sig Dispense Refill   ALPRAZolam (XANAX) 1 MG tablet Take 1 mg by mouth 2 (two) times daily.      Ascorbic Acid (VITAMIN C) 1000 MG tablet Take 1,000 mg by mouth daily.     aspirin 81 MG tablet Take 81 mg by mouth every evening.     CALCIUM CARBONATE PO Take 1 tablet by mouth daily.     Cyanocobalamin (VITAMIN B 12 PO) Take 1 tablet by mouth daily.      fluticasone (FLONASE) 50 MCG/ACT nasal spray Place 2 sprays into the nose daily as needed for allergies.      HYDROcodone-acetaminophen (NORCO/VICODIN) 5-325 MG tablet Take 1 tablet by mouth every 6 (six) hours as needed for severe pain. 30 tablet 0   levothyroxine (SYNTHROID, LEVOTHROID) 112 MCG tablet Take 112 mcg by mouth as directed. Take Only on (Sun, Tues, Wed, Thurs, Sat)     levothyroxine (SYNTHROID, LEVOTHROID) 125 MCG tablet Take 125 mcg by mouth as directed. Take Only (Mon & Fri)     magnesium oxide (MAG-OX) 400 MG tablet Take 400 mg by mouth daily.     metoprolol succinate (TOPROL-XL) 25 MG 24 hr tablet Take 12.5 mg by mouth 2 (two) times daily.     omeprazole (PRILOSEC) 20 MG capsule Take 20 mg by mouth daily.      pravastatin (PRAVACHOL) 40 MG tablet Take 40 mg by  mouth at bedtime.     sertraline (ZOLOFT) 50 MG tablet Take 50 mg by mouth daily.     warfarin (COUMADIN) 5 MG tablet Take 5 mg daily except on Mondays: take 2.5 mg (1/2 tablet) (Patient taking differently: Take 1.5 tablets (7.5  mg) on (Mon & Fri) & Take 1 tablet (5 mg) all other days) 90 tablet 3   No current facility-administered medications for this visit.    Allergies  Allergen Reactions   Bee Venom Anaphylaxis   Hornet Venom Anaphylaxis    Anaphylaxis Shock SHOCK   Reglan [Metoclopramide] Shortness Of Breath and Other (See Comments)    Tongue got numb; didn't feel good   Vitamin K Anaphylaxis and Rash    "kills me" iv   Baclofen     Dizziness   Cephalexin Other (See Comments)   Contrast Media [Iodinated Diagnostic Agents]     Other reaction(s): Other (See Comments)   Duloxetine Other (See Comments)    Does not sleep and cramps   Hydrocodone-Acetaminophen Other (See Comments)   Benadryl [Diphenhydramine Hcl] Rash   Ciprofloxacin Rash   Crestor [Rosuvastatin Calcium] Other (See Comments)    Muscle Aches   Cymbalta [Duloxetine Hcl] Swelling    Swelling of tongue and thrush   Daypro [Oxaprozin] Rash    Rash   Doxycycline Other (See Comments)    Tongue burning, thrush   Levofloxacin Rash   Sulfonamide Derivatives Rash   Zocor [Simvastatin] Other (See Comments)    Muscle Aches     REVIEW OF SYSTEMS:   '[X]'$  denotes positive finding, '[ ]'$  denotes negative finding Cardiac  Comments:  Chest pain or chest pressure: X  On exertion  Shortness of breath upon exertion: X   Short of breath when lying flat:    Irregular heart rhythm:        Vascular    Pain in calf, thigh, or hip brought on by ambulation:    Pain in feet at night that wakes you up from your sleep:     Blood clot in your veins:    Leg swelling:         Pulmonary    Oxygen at home:    Productive cough:     Wheezing:         Neurologic    Sudden weakness in arms or legs:     Sudden numbness in arms or  legs:     Sudden onset of difficulty speaking or slurred speech:    Temporary loss of vision in one eye:     Problems with dizziness:         Gastrointestinal    Blood in stool:     Vomited blood:         Genitourinary    Burning when urinating:     Blood in urine:        Psychiatric    Major depression:         Hematologic    Bleeding problems:    Problems with blood clotting too easily:        Skin    Rashes or ulcers:        Constitutional    Fever or chills:      PHYSICAL EXAMINATION:  Vitals:   04/03/21 1215  BP: (!) 151/79  Pulse: 61  Resp: 20  Temp: (!) 97.2 F (36.2 C)  SpO2: 98%  Weight: 176 lb (79.8 kg)  Height: 5' 5.5" (1.664 m)    General:  WDWN in NAD; vital signs documented above Gait: Not observed HENT: WNL, normocephalic Pulmonary: normal non-labored breathing , without Rales, rhonchi,  wheezing Cardiac: regular HR, without  Murmurs  Abdomen: soft, NT, no masses Skin: without rashes Vascular Exam/Pulses:  Right Left  Radial 2+ (normal)  2+ (normal)  Ulnar 2+ (normal) 2+ (normal)  Femoral 2+ (normal) 2+ (normal)  Popliteal 2+ (normal) 2+ (normal)  DP 2+ (normal) 2+ (normal)  PT 2+ (normal) 2+ (normal)   Extremities: without ischemic changes, without Gangrene , without cellulitis; without open wounds;  Musculoskeletal: no muscle wasting or atrophy  Neurologic: A&O X 3;  No focal weakness or paresthesias are detected Psychiatric:  The pt has Normal affect.   Non-Invasive Vascular Imaging:        ASSESSMENT/PLAN:: 75 y.o. female presenting with asymptomatic critical stenosis of her right internal carotid artery.  The fall she took several weeks ago appears to be from slipping on the mat, not vertebrobasilar insufficiency. Patient would benefit from carotid revascularization to reduce her stroke risk as she has a greater than 3-year life expectancy.    Prior to carotid revascularization, and would benefit from cardiology  restratification as she is unable to complete 4 METS at home, and states with strenuous exercise she has stable angina.  In evaluating her imaging, I feel the best operation for [place] is carotid endarterectomy.  We discussed both CEA and TCAR.  I discussed with the patient the risks, benefits, and alternatives to carotid endarterectomy.   The patient is not a great a candidate for carotid artery stenting.  The patient is aware that the risks of carotid endarterectomy include but are not limited to: bleeding, infection, stroke, myocardial infarction, death, cranial nerve injuries both temporary and permanent, neck hematoma, possible airway compromise, labile blood pressure post-operatively, cerebral hyperperfusion syndrome, and possible need for additional interventions in the future.  The patient is aware of the risks and agrees to proceed forward with the procedure.   Patient will need to be bridged with Lovenox prior to her carotid endarterectomy I will discuss triple therapy versus single antiplatelet therapy and anticoagulation postoperatively with my partners    [facility] [phone]

## 2021-04-03 NOTE — Telephone Encounter (Signed)
Left a detailed message for pt to call back regarding surgical clearance and the need for an appointment.  If pt calls back, needs to be scheduled to see Dr. Percival Spanish or APP.

## 2021-04-03 NOTE — Progress Notes (Signed)
Office Note     CC:  Asymptomatic carotid stenosis  Requesting Provider:  [name]., PA-C  HPI: [name] is a 75 y.o. (1946-02-03) female who presents to clinic at the request of her PCP for incidental finding of right internal carotid artery stenosis.  Few weeks ago, [name] fell in the shower due to the bath mat slipping out from under her.  She was immediately taken to the hospital and underwent work-up for cerebral hemorrhage as she hit her head on anticoagulation, which she takes due to being antiphospholipid antibody positive.  CT angiogram at that time demonstrated incidental finding of right internal carotid artery stenosis.  On exam, and was doing well.  She denied history of TIA, stroke, amaurosis.  She was seen in the vascular surgery outpatient clinic years ago however did not have the stenosis present.  She denies dizziness, unsteady gait.  Surgical history includes total thyroidectomy, Nissen, lumpectomy.  [name] is the primary caregiver for her mother who was in her 67s and has dementia.  The pt is on a statin for cholesterol management.  The pt is on a daily aspirin.   Other AC:  warfarin - Anti Phos antibody The pt is on metoprolol for hypertension.   The pt is not diabetic.   Tobacco hx:  none  Past Medical History:  Diagnosis Date   Anxiety    severe panic attacks   Arthritis    Aseptic meningitis    Asthma    as child   Breast cancer ([place])    right   Chronic anticoagulation 08/04/2015   Collapsed lung    hx of, left   Complication of anesthesia    "hard to put asleep"   Depression    Fibromyalgia    GERD (gastroesophageal reflux disease)    H/O hiatal hernia    Headache(784.0)    Hepatitis 1990   "from eating at restaurant"   History of antiphospholipid antibody syndrome    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   Hx-TIA (transient ischemic attack)    Hyperlipidemia    Hypertension    Hypothyroidism     Neuromuscular disorder ([place])    Personal history of radiation therapy    Pneumonia    hx of   Soft tissue mass 06/14/2017   3 cm right post axilla same side as previous breast cancer 06/14/17   Stroke ([place])    Thrombosis, 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   Left digital artery  October 1998 - new dx antiphospholipid antibody syndrome    Past Surgical History:  Procedure Laterality Date   ABDOMINAL HYSTERECTOMY     BREAST LUMPECTOMY Right 2000   BREAST SURGERY Right    x3   BUNIONECTOMY Bilateral 30 years ago   CARDIAC CATHETERIZATION  07/12/1997   NORMAL LEFT VENTRICULAR FUNCTION WITH EF AT LEAST 70-75%   ESOPHAGEAL MANOMETRY N/A 11/06/2012   Procedure: ESOPHAGEAL MANOMETRY (EM);  Surgeon: Pedro Earls, MD;  Location: [facility];  Service: General;  Laterality: N/A;   ESOPHAGOGASTRODUODENOSCOPY ENDOSCOPY     KNEE ARTHROSCOPY Right    LAPAROSCOPIC NISSEN FUNDOPLICATION N/A 123456   Procedure: LAPAROSCOPIC TAKEDOWN  PERI-HIATAL HERNIA    REPAIR;  Surgeon: Pedro Earls, MD;  [facility];  Service: General;  Laterality: N/A;   [place]  in 20's   TONSILLECTOMY  75 years old   UPPER GI  ENDOSCOPY N/A 12/12/2012   Procedure: UPPER GI ENDOSCOPY;  Surgeon: Pedro Earls, MD;  [facility];  Service: General;  Laterality: N/A;    Social History   Socioeconomic History   Marital status: Single    Spouse name: Not on file   Number of children: 2   Years of education: 11   Highest education level: Not on file  Occupational History   Not on file  Tobacco Use   Smoking status: Never   Smokeless tobacco: Never  Vaping Use   Vaping Use: Never used  Substance and Sexual Activity   Alcohol use: No    Alcohol/week: 0.0 standard drinks   Drug use: No   Sexual activity: Never  Other Topics Concern   Not on file  Social History Narrative   Lives at home with her mother.  She is her mother's primary  caregiver due to dementia.   Right-handed.   2-4 cups caffeine daily.   Social Determinants of Health   Financial Resource Strain: Not on file  Food Insecurity: Not on file  Transportation Needs: Not on file  Physical Activity: Not on file  Stress: Not on file  Social Connections: Not on file  Intimate Partner Violence: Not on file    Family History  Problem Relation Age of Onset   Alzheimer's disease Mother    Heart disease Mother    Hyperlipidemia Mother    Hypertension Mother    Cancer Maternal Grandmother        colon   Heart disease Sister    Hypertension Sister    Heart attack Sister    Heart disease Brother    Heart attack Brother    Cancer Daughter    Hypertension Daughter    Hypertension Son     Current Outpatient Medications  Medication Sig Dispense Refill   ALPRAZolam (XANAX) 1 MG tablet Take 1 mg by mouth 2 (two) times daily.      Ascorbic Acid (VITAMIN C) 1000 MG tablet Take 1,000 mg by mouth daily.     aspirin 81 MG tablet Take 81 mg by mouth every evening.     CALCIUM CARBONATE PO Take 1 tablet by mouth daily.     Cyanocobalamin (VITAMIN B 12 PO) Take 1 tablet by mouth daily.      fluticasone (FLONASE) 50 MCG/ACT nasal spray Place 2 sprays into the nose daily as needed for allergies.      HYDROcodone-acetaminophen (NORCO/VICODIN) 5-325 MG tablet Take 1 tablet by mouth every 6 (six) hours as needed for severe pain. 30 tablet 0   levothyroxine (SYNTHROID, LEVOTHROID) 112 MCG tablet Take 112 mcg by mouth as directed. Take Only on (Sun, Tues, Wed, Thurs, Sat)     levothyroxine (SYNTHROID, LEVOTHROID) 125 MCG tablet Take 125 mcg by mouth as directed. Take Only (Mon & Fri)     magnesium oxide (MAG-OX) 400 MG tablet Take 400 mg by mouth daily.     metoprolol succinate (TOPROL-XL) 25 MG 24 hr tablet Take 12.5 mg by mouth 2 (two) times daily.     omeprazole (PRILOSEC) 20 MG capsule Take 20 mg by mouth daily.      pravastatin (PRAVACHOL) 40 MG tablet Take 40 mg by  mouth at bedtime.     sertraline (ZOLOFT) 50 MG tablet Take 50 mg by mouth daily.     warfarin (COUMADIN) 5 MG tablet Take 5 mg daily except on Mondays: take 2.5 mg (1/2 tablet) (Patient taking differently: Take 1.5 tablets (7.5  mg) on (Mon & Fri) & Take 1 tablet (5 mg) all other days) 90 tablet 3   No current facility-administered medications for this visit.    Allergies  Allergen Reactions   Bee Venom Anaphylaxis   Hornet Venom Anaphylaxis    Anaphylaxis Shock SHOCK   Reglan [Metoclopramide] Shortness Of Breath and Other (See Comments)    Tongue got numb; didn't feel good   Vitamin K Anaphylaxis and Rash    "kills me" iv   Baclofen     Dizziness   Cephalexin Other (See Comments)   Contrast Media [Iodinated Diagnostic Agents]     Other reaction(s): Other (See Comments)   Duloxetine Other (See Comments)    Does not sleep and cramps   Hydrocodone-Acetaminophen Other (See Comments)   Benadryl [Diphenhydramine Hcl] Rash   Ciprofloxacin Rash   Crestor [Rosuvastatin Calcium] Other (See Comments)    Muscle Aches   Cymbalta [Duloxetine Hcl] Swelling    Swelling of tongue and thrush   Daypro [Oxaprozin] Rash    Rash   Doxycycline Other (See Comments)    Tongue burning, thrush   Levofloxacin Rash   Sulfonamide Derivatives Rash   Zocor [Simvastatin] Other (See Comments)    Muscle Aches     REVIEW OF SYSTEMS:   '[X]'$  denotes positive finding, '[ ]'$  denotes negative finding Cardiac  Comments:  Chest pain or chest pressure: X  On exertion  Shortness of breath upon exertion: X   Short of breath when lying flat:    Irregular heart rhythm:        Vascular    Pain in calf, thigh, or hip brought on by ambulation:    Pain in feet at night that wakes you up from your sleep:     Blood clot in your veins:    Leg swelling:         Pulmonary    Oxygen at home:    Productive cough:     Wheezing:         Neurologic    Sudden weakness in arms or legs:     Sudden numbness in arms or  legs:     Sudden onset of difficulty speaking or slurred speech:    Temporary loss of vision in one eye:     Problems with dizziness:         Gastrointestinal    Blood in stool:     Vomited blood:         Genitourinary    Burning when urinating:     Blood in urine:        Psychiatric    Major depression:         Hematologic    Bleeding problems:    Problems with blood clotting too easily:        Skin    Rashes or ulcers:        Constitutional    Fever or chills:      PHYSICAL EXAMINATION:  Vitals:   04/03/21 1215  BP: (!) 151/79  Pulse: 61  Resp: 20  Temp: (!) 97.2 F (36.2 C)  SpO2: 98%  Weight: 176 lb (79.8 kg)  Height: 5' 5.5" (1.664 m)    General:  WDWN in NAD; vital signs documented above Gait: Not observed HENT: WNL, normocephalic Pulmonary: normal non-labored breathing , without Rales, rhonchi,  wheezing Cardiac: regular HR, without  Murmurs  Abdomen: soft, NT, no masses Skin: without rashes Vascular Exam/Pulses:  Right Left  Radial 2+ (normal)  2+ (normal)  Ulnar 2+ (normal) 2+ (normal)  Femoral 2+ (normal) 2+ (normal)  Popliteal 2+ (normal) 2+ (normal)  DP 2+ (normal) 2+ (normal)  PT 2+ (normal) 2+ (normal)   Extremities: without ischemic changes, without Gangrene , without cellulitis; without open wounds;  Musculoskeletal: no muscle wasting or atrophy  Neurologic: A&O X 3;  No focal weakness or paresthesias are detected Psychiatric:  The pt has Normal affect.   Non-Invasive Vascular Imaging:        ASSESSMENT/PLAN:: 75 y.o. female presenting with asymptomatic critical stenosis of her right internal carotid artery.  The fall she took several weeks ago appears to be from slipping on the mat, not vertebrobasilar insufficiency. Patient would benefit from carotid revascularization to reduce her stroke risk as she has a greater than 3-year life expectancy.    Prior to carotid revascularization, and would benefit from cardiology  restratification as she is unable to complete 4 METS at home, and states with strenuous exercise she has stable angina.  In evaluating her imaging, I feel the best operation for [place] is carotid endarterectomy.  We discussed both CEA and TCAR.  I discussed with the patient the risks, benefits, and alternatives to carotid endarterectomy.   The patient is not a great a candidate for carotid artery stenting.  The patient is aware that the risks of carotid endarterectomy include but are not limited to: bleeding, infection, stroke, myocardial infarction, death, cranial nerve injuries both temporary and permanent, neck hematoma, possible airway compromise, labile blood pressure post-operatively, cerebral hyperperfusion syndrome, and possible need for additional interventions in the future.  The patient is aware of the risks and agrees to proceed forward with the procedure.   Patient will need to be bridged with Lovenox prior to her carotid endarterectomy I will discuss triple therapy versus single antiplatelet therapy and anticoagulation postoperatively with my partners    [facility] [phone]

## 2021-04-03 NOTE — Telephone Encounter (Signed)
   [facility] Pre-operative Risk Assessment    Request for surgical clearance:  What type of surgery is being performed? Carotid Endarterectomy of Right Side   When is this surgery scheduled?  04/23/21  What type of clearance is required (medical clearance vs. Pharmacy clearance to hold med vs. Both)?  Both   Are there any medications that need to be held prior to surgery and how long? -Their office is requesting a Lovenox bridge  -Coumadin--they would like to know what we are recommending Practice name and name of physician performing surgery?  [facility]  Dr. Virl Cagey  What is your office phone number? [phone]    7.   What is your office fax number? [phone]  8.   Anesthesia type (None, local, MAC, general) ?  [place] 04/03/2021, 1:04 PM  ________________________________

## 2021-04-03 NOTE — Telephone Encounter (Signed)
   Name: [name]  DOB: 03-23-1946  MRN: [id number]  Primary Cardiologist: Minus Breeding, MD  Chart reviewed as part of pre-operative protocol coverage. Because of [name]'s past medical history and time since last visit, she will require a follow-up visit in order to better assess preoperative cardiovascular risk.  Pre-op covering staff: - Please schedule appointment and call patient to inform them. If patient already had an upcoming appointment within acceptable timeframe, please add "pre-op clearance" to the appointment notes so provider is aware. - Please contact requesting surgeon's office via preferred method (i.e, phone, fax) to inform them of need for appointment prior to surgery.  If applicable, this message will also be routed to pharmacy pool and/or primary cardiologist for input on holding anticoagulant/antiplatelet agent as requested below so that this information is available to the clearing provider at time of patient's appointment.   [place], [place]  04/03/2021, 4:15 PM

## 2021-04-09 NOTE — Progress Notes (Addendum)
Cardiology Office Note:    Date:  04/13/2021   ID:  [name], [name] 07-20-1946, MRN [id number]  PCP:  [name]., PA-C  Cardiologist:  Minus Breeding, MD   Referring MD: [name]., PA-C   Chief Complaint  Patient presents with   Pre-op Exam    History of Present Illness:    [name] is a 75 y.o. female with a hx of normal coronaries by heart cath in 1998 with nonischemic stress tests in 2012 and Jan 2019. She last saw Dr. Percival Spanish in 04/2020 and complained of chest pain. Follow up ETT was negative for ischemia. HLD has been managed by PCP. She is on ASA, BB, and statin.   She is here today for preop clearance for right carotid endarterectomy on 04/23/21. She is unable to complete 4.0 METS due to dyspnea. No SOB at rest, but is unable to complete house chores and is finding it difficult caring for her mother who has dementia. No angina, no lower extremity swelling, syncope, dizziness, or orthopnea.    Past Medical History:  Diagnosis Date   Anxiety    severe panic attacks   Arthritis    Aseptic meningitis    Asthma    as child   Breast cancer ([place])    right   Chronic anticoagulation 08/04/2015   Collapsed lung    hx of, left   Complication of anesthesia    "hard to put asleep"   Depression    Fibromyalgia    GERD (gastroesophageal reflux disease)    H/O hiatal hernia    Headache(784.0)    Hepatitis 1990   "from eating at restaurant"   History of antiphospholipid antibody syndrome    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   Hx-TIA (transient ischemic attack)    Hyperlipidemia    Hypertension    Hypothyroidism    Neuromuscular disorder ([place])    Personal history of radiation therapy    Pneumonia    hx of   Soft tissue mass 06/14/2017   3 cm right post axilla same side as previous breast cancer 06/14/17   Stroke ([place])    Thrombosis, upper extremity artery ([place]) 04/17/2012   Left digital artery  October 1998 - new  dx antiphospholipid antibody syndrome    Past Surgical History:  Procedure Laterality Date   ABDOMINAL HYSTERECTOMY     BREAST LUMPECTOMY Right 2000   BREAST SURGERY Right    x3   BUNIONECTOMY Bilateral 30 years ago   CARDIAC CATHETERIZATION  07/12/1997   NORMAL LEFT VENTRICULAR FUNCTION WITH EF AT LEAST 70-75%   ESOPHAGEAL MANOMETRY N/A 11/06/2012   Procedure: ESOPHAGEAL MANOMETRY (EM);  Surgeon: Pedro Earls, MD;  Location: [facility];  Service: General;  Laterality: N/A;   ESOPHAGOGASTRODUODENOSCOPY ENDOSCOPY     KNEE ARTHROSCOPY Right    LAPAROSCOPIC NISSEN FUNDOPLICATION N/A [id number]   Procedure: LAPAROSCOPIC TAKEDOWN  PERI-HIATAL HERNIA    REPAIR;  Surgeon: Pedro Earls, MD;  Location: [facility];  Service: General;  Laterality: N/A;   [place]  in 20's   TONSILLECTOMY  75 years old   UPPER GI ENDOSCOPY N/A 12/12/2012   Procedure: UPPER GI ENDOSCOPY;  Surgeon: Pedro Earls, MD;  Location: [facility];  Service: General;  Laterality: N/A;    Current Medications: Current Meds  Medication Sig   ALPRAZolam (XANAX) 1 MG tablet Take  1 mg by mouth 2 (two) times daily.    Ascorbic Acid (VITAMIN C) 1000 MG tablet Take 1,000 mg by mouth daily.   aspirin 81 MG tablet Take 81 mg by mouth every evening.   CALCIUM CARBONATE PO Take 1 tablet by mouth daily.   Cyanocobalamin (VITAMIN B 12 PO) Take 1 tablet by mouth daily.    [START ON 04/21/2021] enoxaparin (LOVENOX) 80 MG/0.8ML injection Inject 0.8 mLs (80 mg total) into the skin every 12 (twelve) hours for 2 days.   fluticasone (FLONASE) 50 MCG/ACT nasal spray Place 2 sprays into the nose daily as needed for allergies.    HYDROcodone-acetaminophen (NORCO/VICODIN) 5-325 MG tablet Take 1 tablet by mouth every 6 (six) hours as needed for severe pain.   levothyroxine (SYNTHROID, LEVOTHROID) 112 MCG tablet Take 112 mcg by mouth as directed. Take Only on (Sun, Tues, Wed, Thurs, Sat)    levothyroxine (SYNTHROID, LEVOTHROID) 125 MCG tablet Take 125 mcg by mouth as directed. Take Only (Mon & Fri)   magnesium oxide (MAG-OX) 400 MG tablet Take 400 mg by mouth daily.   metoprolol succinate (TOPROL-XL) 25 MG 24 hr tablet Take 12.5 mg by mouth 2 (two) times daily.   omeprazole (PRILOSEC) 20 MG capsule Take 20 mg by mouth daily.    pravastatin (PRAVACHOL) 40 MG tablet Take 40 mg by mouth at bedtime.   sertraline (ZOLOFT) 50 MG tablet Take 50 mg by mouth daily.   warfarin (COUMADIN) 5 MG tablet Take 5 mg daily except on Mondays: take 2.5 mg (1/2 tablet) (Patient taking differently: Take 1.5 tablets (7.5 mg) on ( Mon. Fri. & Sun) & Take 1 tablet (5 mg) on Tues. Thurs. & Sat.)     Allergies:   Bee venom, Hornet venom, Reglan [metoclopramide], Vitamin k, Baclofen, Cephalexin, Contrast media [iodinated diagnostic agents], Duloxetine, Hydrocodone-acetaminophen, Benadryl [diphenhydramine hcl], Ciprofloxacin, Crestor [rosuvastatin calcium], Cymbalta [duloxetine hcl], Daypro [oxaprozin], Doxycycline, Levofloxacin, Sulfonamide derivatives, and Zocor [simvastatin]   Social History   Socioeconomic History   Marital status: Single    Spouse name: Not on file   Number of children: 2   Years of education: 11   Highest education level: Not on file  Occupational History   Not on file  Tobacco Use   Smoking status: Never   Smokeless tobacco: Never  Vaping Use   Vaping Use: Never used  Substance and Sexual Activity   Alcohol use: No    Alcohol/week: 0.0 standard drinks   Drug use: No   Sexual activity: Never  Other Topics Concern   Not on file  Social History Narrative   Lives at home with her mother.  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   Right-handed.   2-4 cups caffeine daily.   Social Determinants of Health   Financial Resource Strain: Not on file  Food Insecurity: Not on file  Transportation Needs: Not on file  Physical Activity: Not on file  Stress: Not  on file  Social Connections: Not on file     Family History: The patient's family history includes Alzheimer's disease in her mother; Cancer in her daughter and maternal grandmother; Heart attack in her brother and sister; Heart disease in her brother, mother, and sister; Hyperlipidemia in her mother; Hypertension in her daughter, mother, sister, and son.  ROS:   Please see the history of present illness.     All other systems reviewed and are negative.  EKGs/Labs/Other Studies Reviewed:    The following studies were reviewed  today:  ETT 2021 There was no ST segment deviation noted during stress. Blood pressure demonstrated a normal response to exercise.   Exercise time: 4 min 1 second Workload: 5.8 METS, reduced exercise capacity Test stopped due to: fatigue, SOB BP response: normal HR response: normal Rhythm: SR with PVCs (appear interpolated), SR with stress, SR with rare PVC in recovery.    No ischemia on stress ECG.  EKG:  EKG is  ordered today.  The ekg ordered today demonstrates sinus bradycardia with HR 56  Recent Labs: 03/25/2021: ALT 22; BUN 22; Creatinine, Ser 1.03; Hemoglobin 13.2; Platelets 228; Potassium 4.4; Sodium 134  Recent Lipid Panel    Component Value Date/Time   CHOL 204 (H) 04/14/2016 0525   TRIG 231 (H) 04/14/2016 0525   HDL 40 (L) 04/14/2016 0525   CHOLHDL 5.1 04/14/2016 0525   VLDL 46 (H) 04/14/2016 0525   LDLCALC 118 (H) 04/14/2016 0525    Physical Exam:    VS:  BP 90/75 (BP Location: Left Arm)   Pulse (!) 56   Ht '5\' 5"'$  (1.651 m)   Wt 177 lb 6.4 oz (80.5 kg)   SpO2 98%   BMI 29.52 kg/m     Wt Readings from Last 3 Encounters:  04/13/21 177 lb 6.4 oz (80.5 kg)  04/03/21 176 lb (79.8 kg)  03/25/21 165 lb (74.8 kg)     GEN:  Well nourished, well developed in no acute distress HEENT: Normal NECK: No JVD; No carotid bruits LYMPHATICS: No lymphadenopathy CARDIAC: RRR, no murmurs, rubs, gallops RESPIRATORY:  Clear to auscultation  without rales, wheezing or rhonchi  ABDOMEN: Soft, non-tender, non-distended MUSCULOSKELETAL:  No edema; No deformity  SKIN: Warm and dry NEUROLOGIC:  Alert and oriented x 3 PSYCHIATRIC:  Normal affect   ASSESSMENT:    1. Preoperative clearance   2. Lupus anticoagulant positive   3. Antiphospholipid antibody syndrome (HCC)   4. Acute deep vein thrombosis (DVT) of left upper extremity, unspecified vein (HCC)   5. Hyperlipidemia, unspecified hyperlipidemia type   6. Stenosis of carotid artery, unspecified laterality    PLAN:    In order of problems listed above:  Hx of DVT Chronic anticoagulation Antiphospholipid antibody syndrome - OAC managed by PCP - will be bridged with lovenox per PCP   Hyperlipidemia with LDL goal < 70 - managed by PCP - continue statin - on 40 mg pravachol - given carotid artery disease, recommend LDL below 70   Carotid artery stenosis Preoperative Risk Evaluation Reassuring nuclear stress test in 2019 and ETT in 2021. She has had an increase in dyspnea on exertion - may be related to carotid artery stenosis, but I will obtain an echocardiogram. If that is normal, then cleared for surgery.    ADDENDUM: Echo performed and showed normal LVEF, no WMA, mild DD, and no significant valvular disease. May proceed to surgery without further testing.    Follow up with Hochrein in 1 year.     Medication Adjustments/Labs and Tests Ordered: Current medicines are reviewed at length with the patient today.  Concerns regarding medicines are outlined above.  No orders of the defined types were placed in this encounter.  No orders of the defined types were placed in this encounter.   Signed, Ledora Bottcher, [place]  04/13/2021 11:44 AM    [facility]

## 2021-04-13 ENCOUNTER — Other Ambulatory Visit: Payer: Self-pay

## 2021-04-13 ENCOUNTER — Encounter: Payer: Self-pay | Admitting: Physician Assistant

## 2021-04-13 ENCOUNTER — Ambulatory Visit: Payer: Medicare HMO | Admitting: Physician Assistant

## 2021-04-13 VITALS — BP 90/75 | HR 56 | Ht 65.0 in | Wt 177.4 lb

## 2021-04-13 DIAGNOSIS — R760 Raised antibody titer: Secondary | ICD-10-CM

## 2021-04-13 DIAGNOSIS — D6861 Antiphospholipid syndrome: Secondary | ICD-10-CM

## 2021-04-13 DIAGNOSIS — I82622 Acute embolism and thrombosis of deep veins of left upper extremity: Secondary | ICD-10-CM | POA: Diagnosis not present

## 2021-04-13 DIAGNOSIS — E785 Hyperlipidemia, unspecified: Secondary | ICD-10-CM

## 2021-04-13 DIAGNOSIS — I6529 Occlusion and stenosis of unspecified carotid artery: Secondary | ICD-10-CM

## 2021-04-13 DIAGNOSIS — Z01818 Encounter for other preprocedural examination: Secondary | ICD-10-CM

## 2021-04-13 NOTE — Patient Instructions (Addendum)
Medication Instructions:  No changes *If you need a refill on your cardiac medications before your next appointment, please call your pharmacy*   Lab Work: No labs  If you have labs (blood work) drawn today and your tests are completely normal, you will receive your results only by: [place] (if you have MyChart) OR A paper copy in the mail If you have any lab test that is abnormal or we need to change your treatment, we will call you to review the results.   Testing/Procedures: Pharmacist, hospital, [phone] Draw [place]  Your physician has requested that you have an echocardiogram. Echocardiography is a painless test that uses sound waves to create images of your heart. It provides your doctor with information about the size and shape of your heart and how well your heart's chambers and valves are working. This procedure takes approximately one hour. There are no restrictions for this procedure.    Follow-Up: At [facility], you and your health needs are our priority.  As part of our continuing mission to provide you with exceptional heart care, we have created designated Provider Care Teams.  These Care Teams include your primary Cardiologist (physician) and Advanced Practice Providers (APPs -  Physician Assistants and Nurse Practitioners) who all work together to provide you with the care you need, when you need it.  We recommend signing up for the patient portal called "MyChart".  Sign up information is provided on this After Visit Summary.  MyChart is used to connect with patients for Virtual Visits (Telemedicine).  Patients are able to view lab/test results, encounter notes, upcoming appointments, etc.  Non-urgent messages can be sent to your provider as well.   To learn more about what you can do with MyChart, go to [email or web address].    Your next appointment:   1 year(s)  The format for your next appointment:   In Person  Provider:   Minus Breeding, MD

## 2021-04-15 ENCOUNTER — Other Ambulatory Visit: Payer: Self-pay

## 2021-04-15 ENCOUNTER — Ambulatory Visit (INDEPENDENT_AMBULATORY_CARE_PROVIDER_SITE_OTHER): Payer: Medicare HMO

## 2021-04-15 DIAGNOSIS — I6521 Occlusion and stenosis of right carotid artery: Secondary | ICD-10-CM

## 2021-04-15 DIAGNOSIS — Z01810 Encounter for preprocedural cardiovascular examination: Secondary | ICD-10-CM

## 2021-04-15 DIAGNOSIS — I82622 Acute embolism and thrombosis of deep veins of left upper extremity: Secondary | ICD-10-CM

## 2021-04-15 DIAGNOSIS — R760 Raised antibody titer: Secondary | ICD-10-CM | POA: Diagnosis not present

## 2021-04-15 DIAGNOSIS — D6861 Antiphospholipid syndrome: Secondary | ICD-10-CM | POA: Diagnosis not present

## 2021-04-15 DIAGNOSIS — E785 Hyperlipidemia, unspecified: Secondary | ICD-10-CM

## 2021-04-15 DIAGNOSIS — Z01818 Encounter for other preprocedural examination: Secondary | ICD-10-CM

## 2021-04-15 DIAGNOSIS — I6529 Occlusion and stenosis of unspecified carotid artery: Secondary | ICD-10-CM

## 2021-04-15 LAB — ECHOCARDIOGRAM COMPLETE
Area-P 1/2: 3.03 cm2
MV M vel: 4.33 m/s
MV Peak grad: 75 mmHg
P 1/2 time: 965 msec

## 2021-04-17 NOTE — Pre-Procedure Instructions (Signed)
Surgical Instructions    Your procedure is scheduled on Thursday, April 23, 2021 at 7:30 AM.  Report to [facility] Main Entrance "A" at 5:30 A.M., then check in with the Admitting office.  Call this number if you have problems the morning of surgery:  [phone]   If you have any questions prior to your surgery date call [phone]: Open Monday-Friday 8am-4pm    Remember:  Do not eat or drink after midnight the night before your surgery    Take these medicines the morning of surgery with A SIP OF WATER:  ALPRAZolam (XANAX) levothyroxine (SYNTHROID, LEVOTHROID) metoprolol tartrate (LOPRESSOR) omeprazole (PRILOSEC) sertraline (ZOLOFT)   IF NEEDED: fluticasone (FLONASE) HYDROcodone-acetaminophen (NORCO/VICODIN)   YOU WILL NEED TO HOLD YOUR WARFARIN FOR 3 DAYS BEFORE YOUR PROCEDURE. TAKE YOUR LAST DOSE ON 04/19/21. THEN START LOVENOX INJECTIONS TWICE DAILY ON 04/21/21.   As of today, STOP taking any Aleve, Naproxen, Ibuprofen, Motrin, Advil, Goody's, BC's, all herbal medications, fish oil, and all vitamins.                     Do NOT Smoke (Tobacco/Vaping) or drink Alcohol 24 hours prior to your procedure.  If you use a CPAP at night, you may bring all equipment for your overnight stay.   Contacts, glasses, piercing's, hearing aid's, dentures or partials may not be worn into surgery, please bring cases for these belongings.    For patients admitted to the hospital, discharge time will be determined by your treatment team.   Patients discharged the day of surgery will not be allowed to drive home, and someone needs to stay with them for 24 hours.  ONLY 1 SUPPORT PERSON MAY BE PRESENT WHILE YOU ARE IN SURGERY. IF YOU ARE TO BE ADMITTED ONCE YOU ARE IN YOUR ROOM YOU WILL BE ALLOWED TWO (2) VISITORS.  Minor children may have two parents present. Special consideration for safety and communication needs will be reviewed on a case by case basis.   Special instructions:   [facility]- Preparing For Surgery  Before surgery, you can play an important role. Because skin is not sterile, your skin needs to be as free of germs as possible. You can reduce the number of germs on your skin by washing with CHG (chlorahexidine gluconate) Soap before surgery.  CHG is an antiseptic cleaner which kills germs and bonds with the skin to continue killing germs even after washing.    Oral Hygiene is also important to reduce your risk of infection.  Remember - BRUSH YOUR TEETH THE MORNING OF SURGERY WITH YOUR REGULAR TOOTHPASTE  Please do not use if you have an allergy to CHG or antibacterial soaps. If your skin becomes reddened/irritated stop using the CHG.  Do not shave (including legs and underarms) for at least 48 hours prior to first CHG shower. It is OK to shave your face.  Please follow these instructions carefully.   Shower the NIGHT BEFORE SURGERY and the MORNING OF SURGERY  If you chose to wash your hair, wash your hair first as usual with your normal shampoo.  After you shampoo, rinse your hair and body thoroughly to remove the shampoo.  Use CHG Soap as you would any other liquid soap. You can apply CHG directly to the skin and wash gently with a scrungie or a clean washcloth.   Apply the CHG Soap to your body ONLY FROM THE NECK DOWN.  Do not use on open wounds or open sores. Avoid  contact with your eyes, ears, mouth and genitals (private parts). Wash Face and genitals (private parts)  with your normal soap.   Wash thoroughly, paying special attention to the area where your surgery will be performed.  Thoroughly rinse your body with warm water from the neck down.  DO NOT shower/wash with your normal soap after using and rinsing off the CHG Soap.  Pat yourself dry with a CLEAN TOWEL.  Wear CLEAN PAJAMAS to bed the night before surgery  Place CLEAN SHEETS on your bed the night before your surgery  DO NOT SLEEP WITH PETS.   Day of Surgery: Shower with CHG  soap. Do not wear jewelry, make up, nail polish, gel polish, artificial nails, or any other type of covering on natural nails including finger and toenails. If patients have artificial nails, gel coating, etc. that need to be removed by a nail salon please have this removed prior to surgery. Surgery may need to be canceled/delayed if the surgeon/ anesthesia feels like the patient is unable to be adequately monitored. Do not wear lotions, powders, perfumes, or deodorant. Do not shave 48 hours prior to surgery. Do not bring valuables to the hospital. [facility] is not responsible for any belongings or valuables. Wear Clean/Comfortable clothing the morning of surgery Remember to brush your teeth WITH YOUR REGULAR TOOTHPASTE.   Please read over the following fact sheets that you were given.

## 2021-04-20 ENCOUNTER — Other Ambulatory Visit: Payer: Self-pay

## 2021-04-20 ENCOUNTER — Encounter (HOSPITAL_COMMUNITY): Payer: Self-pay

## 2021-04-20 ENCOUNTER — Telehealth: Payer: Self-pay

## 2021-04-20 ENCOUNTER — Encounter (HOSPITAL_COMMUNITY)
Admission: RE | Admit: 2021-04-20 | Discharge: 2021-04-20 | Disposition: A | Discharge status: Home / Self Care | Payer: Medicare HMO | Source: Ambulatory Visit | Attending: Vascular Surgery | Admitting: Vascular Surgery

## 2021-04-20 DIAGNOSIS — Z20822 Contact with and (suspected) exposure to covid-19: Secondary | ICD-10-CM | POA: Insufficient documentation

## 2021-04-20 DIAGNOSIS — Z01812 Encounter for preprocedural laboratory examination: Secondary | ICD-10-CM | POA: Insufficient documentation

## 2021-04-20 LAB — URINALYSIS, ROUTINE W REFLEX MICROSCOPIC
Bacteria, UA: NONE SEEN
Bilirubin Urine: NEGATIVE
Glucose, UA: NEGATIVE mg/dL
Ketones, ur: NEGATIVE mg/dL
Nitrite: NEGATIVE
Protein, ur: NEGATIVE mg/dL
Specific Gravity, Urine: 1.018 (ref 1.005–1.030)
pH: 5 (ref 5.0–8.0)

## 2021-04-20 LAB — CBC
HCT: 39.2 % (ref 36.0–46.0)
Hemoglobin: 12.5 g/dL (ref 12.0–15.0)
MCH: 29.3 pg (ref 26.0–34.0)
MCHC: 31.9 g/dL (ref 30.0–36.0)
MCV: 91.8 fL (ref 80.0–100.0)
Platelets: 259 10*3/uL (ref 150–400)
RBC: 4.27 MIL/uL (ref 3.87–5.11)
RDW: 15.3 % (ref 11.5–15.5)
WBC: 7.6 10*3/uL (ref 4.0–10.5)
nRBC: 0 % (ref 0.0–0.2)

## 2021-04-20 LAB — COMPREHENSIVE METABOLIC PANEL
ALT: 23 U/L (ref 0–44)
AST: 34 U/L (ref 15–41)
Albumin: 4.3 g/dL (ref 3.5–5.0)
Alkaline Phosphatase: 80 U/L (ref 38–126)
Anion gap: 10 (ref 5–15)
BUN: 22 mg/dL (ref 8–23)
CO2: 27 mmol/L (ref 22–32)
Calcium: 9.2 mg/dL (ref 8.9–10.3)
Chloride: 100 mmol/L (ref 98–111)
Creatinine, Ser: 1.17 mg/dL — ABNORMAL HIGH (ref 0.44–1.00)
GFR, Estimated: 49 mL/min — ABNORMAL LOW (ref >60)
Glucose, Bld: 123 mg/dL — ABNORMAL HIGH (ref 70–99)
Potassium: 3.8 mmol/L (ref 3.5–5.1)
Sodium: 137 mmol/L (ref 135–145)
Total Bilirubin: 0.8 mg/dL (ref 0.3–1.2)
Total Protein: 7.8 g/dL (ref 6.5–8.1)

## 2021-04-20 LAB — SURGICAL PCR SCREEN
MRSA, PCR: NEGATIVE
Staphylococcus aureus: NEGATIVE

## 2021-04-20 LAB — PROTIME-INR
INR: 1.8 — ABNORMAL HIGH (ref 0.8–1.2)
Prothrombin Time: 20.7 seconds — ABNORMAL HIGH (ref 11.4–15.2)

## 2021-04-20 LAB — SARS CORONAVIRUS 2 (TAT 6-24 HRS): SARS Coronavirus 2: NEGATIVE

## 2021-04-20 LAB — APTT: aPTT: 32 seconds (ref 24–36)

## 2021-04-20 NOTE — Pre-Procedure Instructions (Signed)
Surgical Instructions    Your procedure is scheduled on Thursday, April 23, 2021 at 7:30 AM.  Report to [facility] Main Entrance "A" at 5:30 A.M., then check in with the Admitting office.  Call this number if you have problems the morning of surgery:  [phone]   If you have any questions prior to your surgery date call [phone]: Open Monday-Friday 8am-4pm    Remember:  Do not eat or drink after midnight the night before your surgery    Take these medicines the morning of surgery with A SIP OF WATER: ALPRAZolam (XANAX) Levothyroxine (SYNTHROID, LEVOTHROID) Metoprolol tartrate (LOPRESSOR) Omeprazole (PRILOSEC) Sertraline (ZOLOFT)   IF NEEDED: fluticasone (FLONASE) HYDROcodone-acetaminophen (NORCO/VICODIN)   YOU WILL NEED TO HOLD YOUR WARFARIN FOR 3 DAYS BEFORE YOUR PROCEDURE. TAKE YOUR LAST DOSE ON 04/19/21. THEN START LOVENOX INJECTIONS TWICE DAILY ON 04/21/21.   As of today, STOP taking any Aleve, Naproxen, Ibuprofen, Motrin, Advil, Goody's, BC's, all herbal medications, fish oil, and all vitamins.                     Do NOT Smoke (Tobacco/Vaping) or drink Alcohol 24 hours prior to your procedure.  If you use a CPAP at night, you may bring all equipment for your overnight stay.   Contacts, glasses, piercing's, hearing aid's, dentures or partials may not be worn into surgery, please bring cases for these belongings.    For patients admitted to the hospital, discharge time will be determined by your treatment team.   Patients discharged the day of surgery will not be allowed to drive home, and someone needs to stay with them for 24 hours.  ONLY 1 SUPPORT PERSON MAY BE PRESENT WHILE YOU ARE IN SURGERY. IF YOU ARE TO BE ADMITTED ONCE YOU ARE IN YOUR ROOM YOU WILL BE ALLOWED TWO (2) VISITORS.  Minor children may have two parents present. Special consideration for safety and communication needs will be reviewed on a case by case basis.   Special instructions:   [facility]- Preparing For Surgery  Before surgery, you can play an important role. Because skin is not sterile, your skin needs to be as free of germs as possible. You can reduce the number of germs on your skin by washing with CHG (chlorahexidine gluconate) Soap before surgery.  CHG is an antiseptic cleaner which kills germs and bonds with the skin to continue killing germs even after washing.    Oral Hygiene is also important to reduce your risk of infection.  Remember - BRUSH YOUR TEETH THE MORNING OF SURGERY WITH YOUR REGULAR TOOTHPASTE  Please do not use if you have an allergy to CHG or antibacterial soaps. If your skin becomes reddened/irritated stop using the CHG.  Do not shave (including legs and underarms) for at least 48 hours prior to first CHG shower. It is OK to shave your face.  Please follow these instructions carefully.   Shower the NIGHT BEFORE SURGERY and the MORNING OF SURGERY  If you chose to wash your hair, wash your hair first as usual with your normal shampoo.  After you shampoo, rinse your hair and body thoroughly to remove the shampoo.  Use CHG Soap as you would any other liquid soap. You can apply CHG directly to the skin and wash gently with a scrungie or a clean washcloth.   Apply the CHG Soap to your body ONLY FROM THE NECK DOWN.  Do not use on open wounds or open sores. Avoid contact  with your eyes, ears, mouth and genitals (private parts). Wash Face and genitals (private parts)  with your normal soap.   Wash thoroughly, paying special attention to the area where your surgery will be performed.  Thoroughly rinse your body with warm water from the neck down.  DO NOT shower/wash with your normal soap after using and rinsing off the CHG Soap.  Pat yourself dry with a CLEAN TOWEL.  Wear CLEAN PAJAMAS to bed the night before surgery  Place CLEAN SHEETS on your bed the night before your surgery  DO NOT SLEEP WITH PETS.   Day of Surgery: Shower with CHG  soap. Do not wear jewelry, make up, nail polish  Do not wear lotions, powders, perfumes, or deodorant. Do not shave 48 hours prior to surgery. Do not bring valuables to the hospital. [facility] is not responsible for any belongings or valuables. Wear Clean/Comfortable clothing the morning of surgery Remember to brush your teeth WITH YOUR REGULAR TOOTHPASTE.   Please read over the following fact sheets that you were given.

## 2021-04-20 NOTE — Progress Notes (Signed)
PT INR collected at PAT appointment.  Patient on Coumadin - Lovenox bridge.  Last dose of Coumadin was on 04/19/21.  PT INR collected too soon. Need to repeat on DOS.  Per blood bank, patient has positive antibodies on Type & Screen  Need to redraw T&S on DOS.

## 2021-04-20 NOTE — Progress Notes (Signed)
PCP - [name] Cardiologist - Dr. Percival Spanish Patient received cardiac clearance on 04/13/21 - see note from Fabian Sharp, [place]  Chest x-ray - n/a EKG - 04/13/21 Stress Test - 05/16/20 ECHO - 04/15/21  SA - yes, does not wear CPAP  Blood Thinner Instructions: YOU WILL NEED TO HOLD YOUR WARFARIN FOR 3 DAYS BEFORE YOUR PROCEDURE. TAKE YOUR LAST DOSE ON 04/19/21. THEN START LOVENOX INJECTIONS TWICE DAILY ON 04/21/21.    COVID TEST- 04/20/21 (surgery admit)   Anesthesia review: yes, heart history, received cardiac clearance on 04/13/21  Patient denies shortness of breath, fever, cough and chest pain at PAT appointment   All instructions explained to the patient, with a verbal understanding of the material. Patient agrees to go over the instructions while at home for a better understanding. Patient also instructed to self quarantine after being tested for COVID-19. The opportunity to ask questions was provided.

## 2021-04-20 NOTE — Telephone Encounter (Signed)
Blood bank calls today to report that patient has antibodies. Advised that 2 units PRBC were fine for case.

## 2021-04-21 ENCOUNTER — Encounter (HOSPITAL_COMMUNITY): Payer: Self-pay

## 2021-04-21 NOTE — Anesthesia Preprocedure Evaluation (Addendum)
Anesthesia Evaluation  Patient identified by MRN, date of birth, ID band Patient awake    Reviewed: Allergy & Precautions, NPO status , Patient's Chart, lab work & pertinent test results  Airway Mallampati: II  TM Distance: >3 FB Neck ROM: Full    Dental  (+) Dental Advisory Given, Partial Lower, Partial Upper   Pulmonary asthma , sleep apnea ,    breath sounds clear to auscultation       Cardiovascular hypertension, + Peripheral Vascular Disease   Rhythm:Regular Rate:Normal  Echo: 1. Left ventricular ejection fraction, by estimation, is 60 to 65%. The  left ventricle has normal function. The left ventricle has no regional  wall motion abnormalities. Left ventricular diastolic parameters are  consistent with Grade I diastolic  dysfunction (impaired relaxation). The average left ventricular global  longitudinal strain is -22.2 %. The global longitudinal strain is normal.  2. Right ventricular systolic function is normal. The right ventricular  size is normal. There is normal pulmonary artery systolic pressure.  3. The mitral valve is normal in structure. Trivial mitral valve  regurgitation. No evidence of mitral stenosis.  4. The aortic valve is tricuspid. There is mild calcification of the  aortic valve. There is mild thickening of the aortic valve. Aortic valve  regurgitation is mild. No aortic stenosis is present. Aortic regurgitation  PHT measures 965 msec.  5. The inferior vena cava is normal in size with greater than 50%  respiratory variability, suggesting right atrial pressure of 3 mmHg.    Neuro/Psych  Headaches, PSYCHIATRIC DISORDERS Anxiety Depression  Neuromuscular disease CVA    GI/Hepatic hiatal hernia, GERD  ,(+) Hepatitis -  Endo/Other  Hypothyroidism   Renal/GU negative Renal ROS     Musculoskeletal  (+) Arthritis , Fibromyalgia -  Abdominal Normal abdominal exam  (+)   Peds  Hematology    Anesthesia Other Findings   Reproductive/Obstetrics                           Anesthesia Physical Anesthesia Plan  ASA: 3  Anesthesia Plan: General   Post-op Pain Management:    Induction: Intravenous  PONV Risk Score and Plan: 4 or greater and Ondansetron, Dexamethasone and Treatment may vary due to age or medical condition  Airway Management Planned: Oral ETT  Additional Equipment: Arterial line  Intra-op Plan:   Post-operative Plan: Extubation in OR  Informed Consent: I have reviewed the patients History and Physical, chart, labs and discussed the procedure including the risks, benefits and alternatives for the proposed anesthesia with the patient or authorized representative who has indicated his/her understanding and acceptance.     Dental advisory given  Plan Discussed with: CRNA  Anesthesia Plan Comments: (See PAT note written 04/21/2021 by Myra Gianotti, PA-C. Reported history of awareness under anesthesia > 35 years ago, RUE restrictions due to right breast cancer/LN dissection. )      Anesthesia Quick Evaluation

## 2021-04-21 NOTE — Progress Notes (Signed)
Anesthesia Chart Review:  Case: [id number] Date/Time: 04/23/21 0715   Procedure: RIGHT CAROTID ENDARTERECTOMY (Right)   Anesthesia type: General   Pre-op diagnosis: SYMPTOMATIC CAROTID ARTERY STENOSIS   Location: [facility] ROOM 12 / [facility]   Surgeons: Waynetta Sandy, MD       DISCUSSION: Patient is a 75 year old female scheduled for the above procedure.  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 Says she can be "hard to put asleep" for anesthesia--she said she awareness during her hysterectomy > 35 years.   - ED visit 03/25/21-03/26/21 following a fall in the shower on 03/22/21 and had since felt unsteady/dizzy.   CT head with CTA head/neck included in work-up to evaluate for ICH and carotid dissection. Results showed no acute injury or dissection but did how high grade RICA stenosis. She was discharged with out-patient vascular surgery follow-up. She was evaluated by Orlie Pollen, MD on 04/03/21 and right CEA recommended. She was unable to complete 4 METS and had history of chest pain with strenuous activity, so she was referred for a preoperative cardiology evaluation. Lovenox bridging while warfarin on hold also recommended. Patient told me she was instructed to take last preoperative Lovenox on 04/21/21 PM.  She had preoperative cardiology evaluation by Fabian Sharp, [place] on 04/13/21. Normal ETT in 04/2020. She reported an increase in DOE. Echo  ordered. Following results, she wrote, "Echo performed and showed normal LVEF, no WMA, mild DD, and no significant valvular disease. May proceed to surgery without further testing." One year follow-up recommended.  SHE HAS A RUE RESTRICTION DUE TO LYMPH NODE DISSECTION FOR BREAST CANCER ~ 1999.  04/20/21 presurgical COVID-19 test negative. Anesthesia team to evaluate on the day of surgery.   VS: BP (!) 151/78   Pulse 81   Temp 37 C (Oral)   Resp 19   Ht 5\' 5"  (1.651 m)   Wt 78.3 kg   SpO2 97%   BMI 28.72 kg/m    PROVIDERS: [name]., PA-C is PCP  Minus Breeding, MD is cardiologist   LABS: Labs reviewed: Repeat PT/INR since INR 1.8 (last warfarin 04/19/21).  (all labs ordered are listed, but only abnormal results are displayed)  Labs Reviewed  COMPREHENSIVE METABOLIC PANEL - Abnormal; Notable for the following components:      Result Value   Glucose, Bld 123 (*)    Creatinine, Ser 1.17 (*)    GFR, Estimated 49 (*)    All other components within normal limits  PROTIME-INR - Abnormal; Notable for the following components:   Prothrombin Time 20.7 (*)    INR 1.8 (*)    All other components within normal limits  URINALYSIS, ROUTINE W REFLEX MICROSCOPIC - Abnormal; Notable for the following components:   APPearance HAZY (*)    Hgb urine dipstick MODERATE (*)    Leukocytes,Ua TRACE (*)    All other components within normal limits  SURGICAL PCR SCREEN  SARS CORONAVIRUS 2 (TAT 6-24 HRS)  CBC  APTT  TYPE AND SCREEN     IMAGES: CT head & CTA head/neck 03/26/21: IMPRESSION: 1. No intracranial arterial occlusion or  high-grade stenosis. 2. 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 This may be a source of emboli. - There is no acute or chronic infarction.    EKG: 04/13/21: SB at 56 bpm. Cannot rule out anterior infarct (age undetermined).   CV: Echo 04/15/21: IMPRESSIONS   1. Left ventricular  ejection fraction, by estimation, is 60 to 65%. The  left ventricle has normal function. The left ventricle has no regional  wall motion abnormalities. Left ventricular diastolic parameters are  consistent with Grade I diastolic  dysfunction (impaired relaxation). The average left ventricular global  longitudinal strain is -22.2 %. The global longitudinal strain is normal.   2. Right ventricular systolic function is normal. The right ventricular  size is normal. There is normal pulmonary artery systolic pressure.   3. The mitral valve is normal in structure. Trivial mitral valve  regurgitation. No evidence of mitral stenosis.   4. The aortic valve is tricuspid. There is mild calcification of the  aortic valve. There is mild thickening of the aortic valve. Aortic valve  regurgitation is mild. No aortic stenosis is present. Aortic regurgitation  PHT measures 965 msec.   5. The inferior vena cava is normal in size with greater than 50%  respiratory variability, suggesting right atrial pressure of 3 mmHg.  - Comparison(s): The estimated ejection fraction was in the range of 50% to  55%.    US Carotid 04/03/21: Summary:  Right Carotid: Velocities in the right ICA are consistent with a 80-99%                 stenosis.  Left Carotid: Velocities in the left ICA are consistent with a 1-39%  stenosis.  Vertebrals:  Bilateral vertebral arteries demonstrate antegrade flow.  Subclavians: Normal flow hemodynamics were seen in bilateral subclavian               arteries.    ETT 05/16/20: There was no ST segment deviation noted during stress. Blood pressure demonstrated a normal response to exercise.   Exercise time: 4 min 1 second Workload: 5.8 METS, reduced exercise capacity Test stopped due to: fatigue, SOB BP response: normal HR response: normal Rhythm: SR with PVCs (appear interpolated), SR with stress, SR with rare PVC in recovery.    No ischemia on stress ECG.  Past Medical History:   Diagnosis Date   Anxiety    severe panic attacks   Arthritis    Aseptic meningitis    Asthma    as child   Breast cancer ([facility])    right   Chronic anticoagulation 08/04/2015   Collapsed lung    hx of, left   Complication of anesthesia    "hard to put asleep"   Depression    Fibromyalgia    GERD (gastroesophageal reflux disease)    H/O hiatal hernia    Headache(784.0)    Hepatitis 1990   "from eating at restaurant"   History of antiphospholipid antibody syndrome    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   Hx-TIA (transient ischemic attack)    Hyperlipidemia    Hypertension    Hypothyroidism    Neuromuscular disorder ([facility])    Personal history of radiation therapy    Pneumonia    hx of   Soft tissue mass 06/14/2017   3 cm right post axilla same side as previous breast cancer 06/14/17   Stroke ([facility])    Thrombosis, upper  extremity artery ([facility]) 04/17/2012   Left digital artery  October 1998 - new dx antiphospholipid antibody syndrome    Past Surgical History:  Procedure Laterality Date   ABDOMINAL HYSTERECTOMY     BREAST LUMPECTOMY Right 2000   BREAST SURGERY Right    x3   BUNIONECTOMY Bilateral 30 years ago   CARDIAC CATHETERIZATION  07/12/1997   NORMAL LEFT VENTRICULAR FUNCTION WITH EF AT LEAST 70-75%   ESOPHAGEAL MANOMETRY N/A 11/06/2012   Procedure: ESOPHAGEAL MANOMETRY (EM);  Surgeon: Pedro Earls, MD;  Location: [facility];  Service: General;  Laterality: N/A;   ESOPHAGOGASTRODUODENOSCOPY ENDOSCOPY     KNEE ARTHROSCOPY Right    LAPAROSCOPIC NISSEN FUNDOPLICATION N/A 7/53/0051   Procedure: LAPAROSCOPIC TAKEDOWN  PERI-HIATAL HERNIA    REPAIR;  Surgeon: Pedro Earls, MD;  Location: [facility];  Service: General;  Laterality: N/A;   [place]  in 20's   TONSILLECTOMY  75 years old   UPPER GI ENDOSCOPY N/A 12/12/2012   Procedure: UPPER GI ENDOSCOPY;  Surgeon: Pedro Earls, MD;   Location: [facility];  Service: General;  Laterality: N/A;    MEDICATIONS:  ALPRAZolam (XANAX) 1 MG tablet   Ascorbic Acid (VITAMIN C) 1000 MG tablet   aspirin 81 MG tablet   Calcium Carb-Cholecalciferol (CALCIUM-VITAMIN D) 600-400 MG-UNIT TABS   Cyanocobalamin (VITAMIN B 12) 500 MCG TABS   enoxaparin (LOVENOX) 80 MG/0.8ML injection   fluticasone (FLONASE) 50 MCG/ACT nasal spray   HYDROcodone-acetaminophen (NORCO/VICODIN) 5-325 MG tablet   levothyroxine (SYNTHROID, LEVOTHROID) 112 MCG tablet   levothyroxine (SYNTHROID, LEVOTHROID) 125 MCG tablet   magnesium oxide (MAG-OX) 400 MG tablet   metoprolol tartrate (LOPRESSOR) 25 MG tablet   Multiple Vitamin (MULTIVITAMIN WITH MINERALS) TABS tablet   omeprazole (PRILOSEC) 20 MG capsule   pravastatin (PRAVACHOL) 40 MG tablet   sertraline (ZOLOFT) 100 MG tablet   warfarin (COUMADIN) 5 MG tablet   No current facility-administered medications for this encounter.    Myra Gianotti, PA-C Surgical Short Stay/Anesthesiology [facility] Phone [phone] [facility] Phone [phone] 04/21/2021 5:28 PM

## 2021-04-23 ENCOUNTER — Other Ambulatory Visit: Payer: Self-pay

## 2021-04-23 ENCOUNTER — Inpatient Hospital Stay (HOSPITAL_COMMUNITY): Payer: Medicare HMO | Admitting: Vascular Surgery

## 2021-04-23 ENCOUNTER — Encounter (HOSPITAL_COMMUNITY): Payer: Self-pay | Admitting: Vascular Surgery

## 2021-04-23 ENCOUNTER — Inpatient Hospital Stay (HOSPITAL_COMMUNITY)
Admission: RE | Admit: 2021-04-23 | Discharge: 2021-04-24 | DRG: 038 | Disposition: A | Discharge status: Home / Self Care | Payer: Medicare HMO | Attending: Vascular Surgery | Admitting: Vascular Surgery

## 2021-04-23 ENCOUNTER — Encounter (HOSPITAL_COMMUNITY)
Admission: RE | Disposition: A | Discharge status: Home / Self Care | Payer: Self-pay | Source: Home / Self Care | Attending: Vascular Surgery

## 2021-04-23 ENCOUNTER — Inpatient Hospital Stay (HOSPITAL_COMMUNITY): Payer: Medicare HMO | Admitting: Critical Care Medicine

## 2021-04-23 DIAGNOSIS — D6861 Antiphospholipid syndrome: Secondary | ICD-10-CM | POA: Diagnosis present

## 2021-04-23 DIAGNOSIS — Z8661 Personal history of infections of the central nervous system: Secondary | ICD-10-CM | POA: Diagnosis not present

## 2021-04-23 DIAGNOSIS — Z8249 Family history of ischemic heart disease and other diseases of the circulatory system: Secondary | ICD-10-CM

## 2021-04-23 DIAGNOSIS — Z8673 Personal history of transient ischemic attack (TIA), and cerebral infarction without residual deficits: Secondary | ICD-10-CM | POA: Diagnosis not present

## 2021-04-23 DIAGNOSIS — Z83438 Family history of other disorder of lipoprotein metabolism and other lipidemia: Secondary | ICD-10-CM | POA: Diagnosis not present

## 2021-04-23 DIAGNOSIS — M797 Fibromyalgia: Secondary | ICD-10-CM | POA: Diagnosis present

## 2021-04-23 DIAGNOSIS — Z885 Allergy status to narcotic agent status: Secondary | ICD-10-CM

## 2021-04-23 DIAGNOSIS — Y93E1 Activity, personal bathing and showering: Secondary | ICD-10-CM

## 2021-04-23 DIAGNOSIS — Z91030 Bee allergy status: Secondary | ICD-10-CM | POA: Diagnosis not present

## 2021-04-23 DIAGNOSIS — F410 Panic disorder [episodic paroxysmal anxiety] without agoraphobia: Secondary | ICD-10-CM | POA: Diagnosis present

## 2021-04-23 DIAGNOSIS — Z20822 Contact with and (suspected) exposure to covid-19: Secondary | ICD-10-CM | POA: Diagnosis present

## 2021-04-23 DIAGNOSIS — Z886 Allergy status to analgesic agent status: Secondary | ICD-10-CM | POA: Diagnosis not present

## 2021-04-23 DIAGNOSIS — Z86718 Personal history of other venous thrombosis and embolism: Secondary | ICD-10-CM | POA: Diagnosis not present

## 2021-04-23 DIAGNOSIS — Z820 Family history of epilepsy and other diseases of the nervous system: Secondary | ICD-10-CM

## 2021-04-23 DIAGNOSIS — J45909 Unspecified asthma, uncomplicated: Secondary | ICD-10-CM | POA: Diagnosis present

## 2021-04-23 DIAGNOSIS — Z91041 Radiographic dye allergy status: Secondary | ICD-10-CM

## 2021-04-23 DIAGNOSIS — Z853 Personal history of malignant neoplasm of breast: Secondary | ICD-10-CM

## 2021-04-23 DIAGNOSIS — Z888 Allergy status to other drugs, medicaments and biological substances status: Secondary | ICD-10-CM

## 2021-04-23 DIAGNOSIS — I6521 Occlusion and stenosis of right carotid artery: Secondary | ICD-10-CM | POA: Diagnosis present

## 2021-04-23 DIAGNOSIS — Z882 Allergy status to sulfonamides status: Secondary | ICD-10-CM | POA: Diagnosis not present

## 2021-04-23 DIAGNOSIS — E785 Hyperlipidemia, unspecified: Secondary | ICD-10-CM | POA: Diagnosis present

## 2021-04-23 DIAGNOSIS — Z881 Allergy status to other antibiotic agents status: Secondary | ICD-10-CM | POA: Diagnosis not present

## 2021-04-23 DIAGNOSIS — I10 Essential (primary) hypertension: Secondary | ICD-10-CM | POA: Diagnosis present

## 2021-04-23 DIAGNOSIS — Z7901 Long term (current) use of anticoagulants: Secondary | ICD-10-CM

## 2021-04-23 DIAGNOSIS — Z7982 Long term (current) use of aspirin: Secondary | ICD-10-CM

## 2021-04-23 DIAGNOSIS — K219 Gastro-esophageal reflux disease without esophagitis: Secondary | ICD-10-CM | POA: Diagnosis present

## 2021-04-23 DIAGNOSIS — Z923 Personal history of irradiation: Secondary | ICD-10-CM

## 2021-04-23 DIAGNOSIS — Z79890 Hormone replacement therapy (postmenopausal): Secondary | ICD-10-CM

## 2021-04-23 DIAGNOSIS — I739 Peripheral vascular disease, unspecified: Secondary | ICD-10-CM | POA: Diagnosis present

## 2021-04-23 DIAGNOSIS — W182XXA Fall in (into) shower or empty bathtub, initial encounter: Secondary | ICD-10-CM | POA: Diagnosis present

## 2021-04-23 DIAGNOSIS — Z79899 Other long term (current) drug therapy: Secondary | ICD-10-CM

## 2021-04-23 DIAGNOSIS — E890 Postprocedural hypothyroidism: Secondary | ICD-10-CM | POA: Diagnosis present

## 2021-04-23 HISTORY — PX: ENDARTERECTOMY: SHX5162

## 2021-04-23 HISTORY — PX: PATCH ANGIOPLASTY: SHX6230

## 2021-04-23 LAB — PROTIME-INR
INR: 1.1 (ref 0.8–1.2)
Prothrombin Time: 14.6 seconds (ref 11.4–15.2)

## 2021-04-23 LAB — CBC
HCT: 33.1 % — ABNORMAL LOW (ref 36.0–46.0)
Hemoglobin: 10.8 g/dL — ABNORMAL LOW (ref 12.0–15.0)
MCH: 29.8 pg (ref 26.0–34.0)
MCHC: 32.6 g/dL (ref 30.0–36.0)
MCV: 91.2 fL (ref 80.0–100.0)
Platelets: 191 10*3/uL (ref 150–400)
RBC: 3.63 MIL/uL — ABNORMAL LOW (ref 3.87–5.11)
RDW: 15.2 % (ref 11.5–15.5)
WBC: 8.5 10*3/uL (ref 4.0–10.5)
nRBC: 0 % (ref 0.0–0.2)

## 2021-04-23 LAB — CREATININE, SERUM
Creatinine, Ser: 1.02 mg/dL — ABNORMAL HIGH (ref 0.44–1.00)
GFR, Estimated: 58 mL/min — ABNORMAL LOW (ref >60)

## 2021-04-23 LAB — TYPE AND SCREEN
ABO/RH(D): A POS
Antibody Screen: POSITIVE

## 2021-04-23 SURGERY — ENDARTERECTOMY, CAROTID
Anesthesia: General | Site: Neck | Laterality: Right

## 2021-04-23 MED ORDER — PHENYLEPHRINE HCL-NACL 20-0.9 MG/250ML-% IV SOLN
INTRAVENOUS | Status: DC | PRN
  Administered 2021-04-23: 25 ug/min via INTRAVENOUS

## 2021-04-23 MED ORDER — HEPARIN SODIUM (PORCINE) 1000 UNIT/ML IJ SOLN
INTRAMUSCULAR | Status: DC | PRN
  Administered 2021-04-23: 2000 [IU] via INTRAVENOUS
  Administered 2021-04-23: 8000 [IU] via INTRAVENOUS

## 2021-04-23 MED ORDER — ROCURONIUM BROMIDE 10 MG/ML (PF) SYRINGE
PREFILLED_SYRINGE | INTRAVENOUS | Status: AC
  Filled 2021-04-23: qty 10

## 2021-04-23 MED ORDER — SODIUM CHLORIDE 0.9 % IV SOLN: INTRAVENOUS | Status: DC

## 2021-04-23 MED ORDER — FENTANYL CITRATE (PF) 100 MCG/2ML IJ SOLN
INTRAMUSCULAR | Status: AC
  Filled 2021-04-23: qty 2

## 2021-04-23 MED ORDER — HEPARIN SODIUM (PORCINE) 1000 UNIT/ML IJ SOLN
INTRAMUSCULAR | Status: AC
  Filled 2021-04-23: qty 1

## 2021-04-23 MED ORDER — PROPOFOL 10 MG/ML IV BOLUS
INTRAVENOUS | Status: DC | PRN
  Administered 2021-04-23: 100 mg via INTRAVENOUS
  Administered 2021-04-23: 10 mg via INTRAVENOUS
  Administered 2021-04-23: 20 mg via INTRAVENOUS

## 2021-04-23 MED ORDER — OXYCODONE HCL 5 MG/5ML PO SOLN: 5.0000 mg | Freq: Once | ORAL | Status: DC | PRN

## 2021-04-23 MED ORDER — GUAIFENESIN-DM 100-10 MG/5ML PO SYRP: 15.0000 mL | ORAL_SOLUTION | ORAL | Status: DC | PRN

## 2021-04-23 MED ORDER — DEXAMETHASONE SODIUM PHOSPHATE 10 MG/ML IJ SOLN
INTRAMUSCULAR | Status: AC
  Filled 2021-04-23: qty 1

## 2021-04-23 MED ORDER — 0.9 % SODIUM CHLORIDE (POUR BTL) OPTIME
TOPICAL | Status: DC | PRN
  Administered 2021-04-23: 2000 mL

## 2021-04-23 MED ORDER — METOPROLOL TARTRATE 12.5 MG HALF TABLET
12.5000 mg | ORAL_TABLET | Freq: Two times a day (BID) | ORAL | Status: DC
  Administered 2021-04-23 – 2021-04-24 (×2): 12.5 mg via ORAL
  Filled 2021-04-23 (×2): qty 1

## 2021-04-23 MED ORDER — ASPIRIN EC 81 MG PO TBEC
81.0000 mg | DELAYED_RELEASE_TABLET | Freq: Every evening | ORAL | Status: DC
  Administered 2021-04-23: 81 mg via ORAL
  Filled 2021-04-23: qty 1

## 2021-04-23 MED ORDER — CHLORHEXIDINE GLUCONATE 0.12 % MT SOLN
OROMUCOSAL | Status: AC
  Administered 2021-04-23: 15 mL via OROMUCOSAL
  Filled 2021-04-23: qty 15

## 2021-04-23 MED ORDER — LIDOCAINE HCL (PF) 2 % IJ SOLN
INTRAMUSCULAR | Status: AC
  Filled 2021-04-23: qty 5

## 2021-04-23 MED ORDER — LACTATED RINGERS IV SOLN: INTRAVENOUS | Status: DC | PRN

## 2021-04-23 MED ORDER — ACETAMINOPHEN 325 MG PO TABS
325.0000 mg | ORAL_TABLET | ORAL | Status: DC | PRN
  Administered 2021-04-23: 650 mg via ORAL
  Filled 2021-04-23: qty 2

## 2021-04-23 MED ORDER — ESMOLOL HCL 100 MG/10ML IV SOLN
INTRAVENOUS | Status: AC
  Filled 2021-04-23: qty 10

## 2021-04-23 MED ORDER — GLYCOPYRROLATE PF 0.2 MG/ML IJ SOSY
PREFILLED_SYRINGE | INTRAMUSCULAR | Status: DC | PRN
  Administered 2021-04-23 (×2): .1 mg via INTRAVENOUS

## 2021-04-23 MED ORDER — ONDANSETRON HCL 4 MG/2ML IJ SOLN
INTRAMUSCULAR | Status: AC
  Filled 2021-04-23: qty 2

## 2021-04-23 MED ORDER — HEPARIN 6000 UNIT IRRIGATION SOLUTION
Status: DC | PRN
  Administered 2021-04-23: 1

## 2021-04-23 MED ORDER — PRAVASTATIN SODIUM 40 MG PO TABS
40.0000 mg | ORAL_TABLET | Freq: Every day | ORAL | Status: DC
  Administered 2021-04-23: 40 mg via ORAL
  Filled 2021-04-23: qty 1

## 2021-04-23 MED ORDER — PROTAMINE SULFATE 10 MG/ML IV SOLN
INTRAVENOUS | Status: DC | PRN
  Administered 2021-04-23: 50 mg via INTRAVENOUS

## 2021-04-23 MED ORDER — HEPARIN 6000 UNIT IRRIGATION SOLUTION
Status: AC
  Filled 2021-04-23: qty 500

## 2021-04-23 MED ORDER — ONDANSETRON HCL 4 MG/2ML IJ SOLN
INTRAMUSCULAR | Status: DC | PRN
  Administered 2021-04-23: 4 mg via INTRAVENOUS

## 2021-04-23 MED ORDER — TRAMADOL HCL 50 MG PO TABS
50.0000 mg | ORAL_TABLET | Freq: Four times a day (QID) | ORAL | Status: DC | PRN
Start: 2021-04-23 — End: 2021-04-24

## 2021-04-23 MED ORDER — LIDOCAINE HCL (PF) 1 % IJ SOLN
INTRAMUSCULAR | Status: AC
  Filled 2021-04-23: qty 30

## 2021-04-23 MED ORDER — VANCOMYCIN HCL IN DEXTROSE 1-5 GM/200ML-% IV SOLN
1000.0000 mg | Freq: Two times a day (BID) | INTRAVENOUS | Status: AC
  Administered 2021-04-23 – 2021-04-24 (×2): 1000 mg via INTRAVENOUS
  Filled 2021-04-23 (×2): qty 200

## 2021-04-23 MED ORDER — PANTOPRAZOLE SODIUM 40 MG PO TBEC
40.0000 mg | DELAYED_RELEASE_TABLET | Freq: Every day | ORAL | Status: DC
  Administered 2021-04-24: 40 mg via ORAL
  Filled 2021-04-23: qty 1

## 2021-04-23 MED ORDER — SODIUM CHLORIDE 0.9 % IV SOLN: 500.0000 mL | Freq: Once | INTRAVENOUS | Status: DC | PRN

## 2021-04-23 MED ORDER — ALUM & MAG HYDROXIDE-SIMETH 200-200-20 MG/5ML PO SUSP: 15.0000 mL | ORAL | Status: DC | PRN

## 2021-04-23 MED ORDER — ROCURONIUM BROMIDE 10 MG/ML (PF) SYRINGE
PREFILLED_SYRINGE | INTRAVENOUS | Status: DC | PRN
  Administered 2021-04-23: 50 mg via INTRAVENOUS

## 2021-04-23 MED ORDER — AMISULPRIDE (ANTIEMETIC) 5 MG/2ML IV SOLN: 10.0000 mg | Freq: Once | INTRAVENOUS | Status: DC | PRN

## 2021-04-23 MED ORDER — KETOROLAC TROMETHAMINE 30 MG/ML IJ SOLN
15.0000 mg | Freq: Four times a day (QID) | INTRAMUSCULAR | Status: DC | PRN
  Administered 2021-04-23 – 2021-04-24 (×2): 15 mg via INTRAVENOUS
  Filled 2021-04-23 (×2): qty 1

## 2021-04-23 MED ORDER — SUGAMMADEX SODIUM 200 MG/2ML IV SOLN
INTRAVENOUS | Status: DC | PRN
  Administered 2021-04-23: 200 mg via INTRAVENOUS

## 2021-04-23 MED ORDER — CHLORHEXIDINE GLUCONATE 0.12 % MT SOLN: 15.0000 mL | Freq: Once | OROMUCOSAL | Status: AC

## 2021-04-23 MED ORDER — SUCCINYLCHOLINE CHLORIDE 200 MG/10ML IV SOSY
PREFILLED_SYRINGE | INTRAVENOUS | Status: AC
  Filled 2021-04-23: qty 10

## 2021-04-23 MED ORDER — PHENYLEPHRINE 40 MCG/ML (10ML) SYRINGE FOR IV PUSH (FOR BLOOD PRESSURE SUPPORT)
PREFILLED_SYRINGE | INTRAVENOUS | Status: AC
  Filled 2021-04-23: qty 10

## 2021-04-23 MED ORDER — ACETAMINOPHEN 160 MG/5ML PO SOLN: 325.0000 mg | ORAL | Status: DC | PRN

## 2021-04-23 MED ORDER — ONDANSETRON HCL 4 MG/2ML IJ SOLN: 4.0000 mg | Freq: Four times a day (QID) | INTRAMUSCULAR | Status: DC | PRN

## 2021-04-23 MED ORDER — ORAL CARE MOUTH RINSE: 15.0000 mL | Freq: Once | OROMUCOSAL | Status: AC

## 2021-04-23 MED ORDER — LABETALOL HCL 5 MG/ML IV SOLN: 10.0000 mg | INTRAVENOUS | Status: DC | PRN

## 2021-04-23 MED ORDER — PROPOFOL 10 MG/ML IV BOLUS
INTRAVENOUS | Status: AC
  Filled 2021-04-23: qty 20

## 2021-04-23 MED ORDER — MAGNESIUM SULFATE 2 GM/50ML IV SOLN
2.0000 g | Freq: Every day | INTRAVENOUS | Status: DC | PRN
Start: 2021-04-23 — End: 2021-04-24

## 2021-04-23 MED ORDER — ACETAMINOPHEN 325 MG PO TABS: 325.0000 mg | ORAL_TABLET | ORAL | Status: DC | PRN

## 2021-04-23 MED ORDER — HEMOSTATIC AGENTS (NO CHARGE) OPTIME
TOPICAL | Status: DC | PRN
  Administered 2021-04-23: 1 via TOPICAL

## 2021-04-23 MED ORDER — CHLORHEXIDINE GLUCONATE CLOTH 2 % EX PADS: 6.0000 | MEDICATED_PAD | Freq: Once | CUTANEOUS | Status: DC

## 2021-04-23 MED ORDER — DEXAMETHASONE SODIUM PHOSPHATE 10 MG/ML IJ SOLN
INTRAMUSCULAR | Status: DC | PRN
  Administered 2021-04-23: 4 mg via INTRAVENOUS

## 2021-04-23 MED ORDER — POTASSIUM CHLORIDE CRYS ER 20 MEQ PO TBCR: 20.0000 meq | EXTENDED_RELEASE_TABLET | Freq: Every day | ORAL | Status: DC | PRN

## 2021-04-23 MED ORDER — HYDRALAZINE HCL 20 MG/ML IJ SOLN: 5.0000 mg | INTRAMUSCULAR | Status: DC | PRN

## 2021-04-23 MED ORDER — VANCOMYCIN HCL IN DEXTROSE 1-5 GM/200ML-% IV SOLN
1000.0000 mg | INTRAVENOUS | Status: AC
  Administered 2021-04-23: 1000 mg via INTRAVENOUS
  Filled 2021-04-23: qty 200

## 2021-04-23 MED ORDER — BISACODYL 5 MG PO TBEC: 5.0000 mg | DELAYED_RELEASE_TABLET | Freq: Every day | ORAL | Status: DC | PRN

## 2021-04-23 MED ORDER — SERTRALINE HCL 100 MG PO TABS
100.0000 mg | ORAL_TABLET | Freq: Every day | ORAL | Status: DC
  Administered 2021-04-24: 100 mg via ORAL
  Filled 2021-04-23: qty 1

## 2021-04-23 MED ORDER — GLYCOPYRROLATE PF 0.2 MG/ML IJ SOSY
PREFILLED_SYRINGE | INTRAMUSCULAR | Status: AC
  Filled 2021-04-23: qty 1

## 2021-04-23 MED ORDER — LACTATED RINGERS IV SOLN: INTRAVENOUS | Status: DC

## 2021-04-23 MED ORDER — LEVOTHYROXINE SODIUM 112 MCG PO TABS: 112.0000 ug | ORAL_TABLET | ORAL | Status: DC

## 2021-04-23 MED ORDER — LEVOTHYROXINE SODIUM 25 MCG PO TABS
125.0000 ug | ORAL_TABLET | ORAL | Status: DC
  Administered 2021-04-24: 125 ug via ORAL
  Filled 2021-04-23: qty 1

## 2021-04-23 MED ORDER — MIDAZOLAM HCL 2 MG/2ML IJ SOLN
INTRAMUSCULAR | Status: AC
  Filled 2021-04-23: qty 2

## 2021-04-23 MED ORDER — LIDOCAINE 2% (20 MG/ML) 5 ML SYRINGE
INTRAMUSCULAR | Status: DC | PRN
  Administered 2021-04-23: 60 mg via INTRAVENOUS

## 2021-04-23 MED ORDER — FENTANYL CITRATE (PF) 250 MCG/5ML IJ SOLN
INTRAMUSCULAR | Status: AC
  Filled 2021-04-23: qty 5

## 2021-04-23 MED ORDER — PHENOL 1.4 % MT LIQD: 1.0000 | OROMUCOSAL | Status: DC | PRN

## 2021-04-23 MED ORDER — DOCUSATE SODIUM 100 MG PO CAPS: 100.0000 mg | ORAL_CAPSULE | Freq: Every day | ORAL | Status: DC

## 2021-04-23 MED ORDER — HYDROMORPHONE HCL 1 MG/ML IJ SOLN: 0.5000 mg | INTRAMUSCULAR | Status: DC | PRN

## 2021-04-23 MED ORDER — ENOXAPARIN SODIUM 80 MG/0.8ML IJ SOSY
80.0000 mg | PREFILLED_SYRINGE | Freq: Two times a day (BID) | INTRAMUSCULAR | Status: DC
  Administered 2021-04-23: 80 mg via SUBCUTANEOUS
  Filled 2021-04-23: qty 0.8

## 2021-04-23 MED ORDER — FENTANYL CITRATE (PF) 250 MCG/5ML IJ SOLN
INTRAMUSCULAR | Status: DC | PRN
  Administered 2021-04-23: 50 ug via INTRAVENOUS

## 2021-04-23 MED ORDER — METOPROLOL TARTRATE 5 MG/5ML IV SOLN: 2.0000 mg | INTRAVENOUS | Status: DC | PRN

## 2021-04-23 MED ORDER — FENTANYL CITRATE (PF) 100 MCG/2ML IJ SOLN
25.0000 ug | INTRAMUSCULAR | Status: DC | PRN
  Administered 2021-04-23: 25 ug via INTRAVENOUS

## 2021-04-23 MED ORDER — SENNOSIDES-DOCUSATE SODIUM 8.6-50 MG PO TABS: 1.0000 | ORAL_TABLET | Freq: Every evening | ORAL | Status: DC | PRN

## 2021-04-23 MED ORDER — PHENYLEPHRINE 40 MCG/ML (10ML) SYRINGE FOR IV PUSH (FOR BLOOD PRESSURE SUPPORT)
PREFILLED_SYRINGE | INTRAVENOUS | Status: AC
  Filled 2021-04-23: qty 30

## 2021-04-23 MED ORDER — ACETAMINOPHEN 325 MG PO TABS
325.0000 mg | ORAL_TABLET | Freq: Once | ORAL | Status: DC
  Filled 2021-04-23: qty 1

## 2021-04-23 MED ORDER — SODIUM CHLORIDE 0.9 % IV SOLN
0.0125 ug/kg/min | INTRAVENOUS | Status: AC
  Administered 2021-04-23: .2 ug/kg/min via INTRAVENOUS
  Filled 2021-04-23: qty 2000

## 2021-04-23 MED ORDER — OXYCODONE HCL 5 MG PO TABS: 5.0000 mg | ORAL_TABLET | Freq: Once | ORAL | Status: DC | PRN

## 2021-04-23 MED ORDER — PROTAMINE SULFATE 10 MG/ML IV SOLN
INTRAVENOUS | Status: AC
  Filled 2021-04-23: qty 5

## 2021-04-23 MED ORDER — ACETAMINOPHEN 10 MG/ML IV SOLN: 1000.0000 mg | Freq: Once | INTRAVENOUS | Status: DC | PRN

## 2021-04-23 MED ORDER — ACETAMINOPHEN 650 MG RE SUPP: 325.0000 mg | RECTAL | Status: DC | PRN

## 2021-04-23 SURGICAL SUPPLY — 62 items
ADH SKN CLS APL DERMABOND .7 (GAUZE/BANDAGES/DRESSINGS) ×1
ADPR TBG 2 MALE LL ART (MISCELLANEOUS)
AGENT HMST KT MTR STRL THRMB (HEMOSTASIS) ×1
BAG COUNTER SPONGE SURGICOUNT (BAG) ×2 IMPLANT
BAG SPNG CNTER NS LX DISP (BAG) ×1
CANISTER SUCT 3000ML PPV (MISCELLANEOUS) ×2 IMPLANT
CATH ROBINSON RED A/P 18FR (CATHETERS) ×2 IMPLANT
CLIP LIGATING EXTRA MED SLVR (CLIP) ×2 IMPLANT
CLIP VESOCCLUDE SM WIDE 24/CT (CLIP) ×2 IMPLANT
DERMABOND ADVANCED (GAUZE/BANDAGES/DRESSINGS) ×1
DERMABOND ADVANCED .7 DNX12 (GAUZE/BANDAGES/DRESSINGS) ×1 IMPLANT
DRAIN CHANNEL 15F RND FF W/TCR (WOUND CARE) IMPLANT
ELECT REM PT RETURN 9FT ADLT (ELECTROSURGICAL) ×2
ELECTRODE REM PT RTRN 9FT ADLT (ELECTROSURGICAL) ×1 IMPLANT
EVACUATOR SILICONE 100CC (DRAIN) IMPLANT
GAUZE 4X4 16PLY ~~LOC~~+RFID DBL (SPONGE) ×1 IMPLANT
GLOVE SRG 8 PF TXTR STRL LF DI (GLOVE) IMPLANT
GLOVE SURG ENC MOIS LTX SZ7.5 (GLOVE) ×3 IMPLANT
GLOVE SURG POLYISO LF SZ6.5 (GLOVE) ×1 IMPLANT
GLOVE SURG UNDER POLY LF SZ6.5 (GLOVE) ×1 IMPLANT
GLOVE SURG UNDER POLY LF SZ7 (GLOVE) ×1 IMPLANT
GLOVE SURG UNDER POLY LF SZ8 (GLOVE) ×2
GOWN STRL REUS W/ TWL LRG LVL3 (GOWN DISPOSABLE) ×2 IMPLANT
GOWN STRL REUS W/ TWL XL LVL3 (GOWN DISPOSABLE) ×1 IMPLANT
GOWN STRL REUS W/TWL 2XL LVL3 (GOWN DISPOSABLE) ×1 IMPLANT
GOWN STRL REUS W/TWL LRG LVL3 (GOWN DISPOSABLE) ×6
GOWN STRL REUS W/TWL XL LVL3 (GOWN DISPOSABLE) ×4
HEMOSTAT SNOW SURGICEL 2X4 (HEMOSTASIS) IMPLANT
INSERT FOGARTY SM (MISCELLANEOUS) IMPLANT
IV ADAPTER SYR DOUBLE MALE LL (MISCELLANEOUS) IMPLANT
KIT BASIN OR (CUSTOM PROCEDURE TRAY) ×2 IMPLANT
KIT SHUNT ARGYLE CAROTID ART 6 (VASCULAR PRODUCTS) ×2 IMPLANT
KIT TURNOVER KIT B (KITS) ×2 IMPLANT
NDL HYPO 25GX1X1/2 BEV (NEEDLE) IMPLANT
NDL SPNL 20GX3.5 QUINCKE YW (NEEDLE) IMPLANT
NEEDLE HYPO 25GX1X1/2 BEV (NEEDLE) IMPLANT
NEEDLE SPNL 20GX3.5 QUINCKE YW (NEEDLE) IMPLANT
NS IRRIG 1000ML POUR BTL (IV SOLUTION) ×5 IMPLANT
PACK CAROTID (CUSTOM PROCEDURE TRAY) ×2 IMPLANT
PAD ARMBOARD 7.5X6 YLW CONV (MISCELLANEOUS) ×4 IMPLANT
PATCH VASC XENOSURE 1CMX6CM (Vascular Products) ×2 IMPLANT
PATCH VASC XENOSURE 1X6 (Vascular Products) IMPLANT
POSITIONER HEAD DONUT 9IN (MISCELLANEOUS) ×2 IMPLANT
SPONGE T-LAP 18X18 ~~LOC~~+RFID (SPONGE) ×1 IMPLANT
STOPCOCK 4 WAY LG BORE MALE ST (IV SETS) IMPLANT
SURGIFLO W/THROMBIN 8M KIT (HEMOSTASIS) ×1 IMPLANT
SUT ETHILON 3 0 PS 1 (SUTURE) IMPLANT
SUT MNCRL AB 4-0 PS2 18 (SUTURE) ×2 IMPLANT
SUT PROLENE 6 0 BV (SUTURE) ×3 IMPLANT
SUT PROLENE 7 0 BV1 MDA (SUTURE) ×1 IMPLANT
SUT SILK 2 0 (SUTURE) ×2
SUT SILK 2-0 18XBRD TIE 12 (SUTURE) IMPLANT
SUT SILK 3 0 (SUTURE) ×2
SUT SILK 3-0 18XBRD TIE 12 (SUTURE) IMPLANT
SUT SILK 4 0 (SUTURE) ×2
SUT SILK 4-0 18XBRD TIE 12 (SUTURE) IMPLANT
SUT VIC AB 3-0 SH 27 (SUTURE) ×4
SUT VIC AB 3-0 SH 27X BRD (SUTURE) ×1 IMPLANT
SYR CONTROL 10ML LL (SYRINGE) IMPLANT
TOWEL GREEN STERILE (TOWEL DISPOSABLE) ×2 IMPLANT
TUBING ART PRESS 48 MALE/FEM (TUBING) IMPLANT
WATER STERILE IRR 1000ML POUR (IV SOLUTION) ×2 IMPLANT

## 2021-04-23 NOTE — Discharge Instructions (Signed)
   [facility]  Discharge Instructions   Carotid Surgery  Please refer to the following instructions for your post-procedure care. Your surgeon or physician assistant will discuss any changes with you.  Activity  You are encouraged to walk as much as you can. You can slowly return to normal activities but must avoid strenuous activity and heavy lifting until your doctor tell you it's okay. Avoid activities such as vacuuming or swinging a golf club. You can drive after one week if you are comfortable and you are no longer taking prescription pain medications. It is normal to feel tired for serval weeks after your surgery. It is also normal to have difficulty with sleep habits, eating, and bowel movements after surgery. These will go away with time.  Bathing/Showering  Shower daily after you go home. Do not soak in a bathtub, hot tub, or swim until the incision heals completely.  Incision Care  Shower every day. Clean your incision with mild soap and water. Pat the area dry with a clean towel. You do not need a bandage unless otherwise instructed. Do not apply any ointments or creams to your incision. You may have skin glue on your incision. Do not peel it off. It will come off on its own in about one week. Your incision may feel thickened and raised for several weeks after your surgery. This is normal and the skin will soften over time.   For Men Only: It's okay to shave around the incision but do not shave the incision itself for 2 weeks. It is common to have numbness under your chin that could last for several months.  Diet  Resume your normal diet. There are no special food restrictions following this procedure. A low fat/low cholesterol diet is recommended for all patients with vascular disease. In order to heal from your surgery, it is CRITICAL to get adequate nutrition. Your body requires vitamins, minerals, and protein. Vegetables are the best source of  vitamins and minerals. Vegetables also provide the perfect balance of protein. Processed food has little nutritional value, so try to avoid this.  Medications  Resume taking all of your medications unless your doctor or physician assistant tells you not to. If your incision is causing pain, you may take over-the- counter pain relievers such as acetaminophen (Tylenol). If you were prescribed a stronger pain medication, please be aware these medications can cause nausea and constipation. Prevent nausea by taking the medication with a snack or meal. Avoid constipation by drinking plenty of fluids and eating foods with a high amount of fiber, such as fruits, vegetables, and grains.   Do not take Tylenol if you are taking prescription pain medications.  Follow Up  Our office will schedule a follow up appointment 2-3 weeks following discharge.  Please call us immediately for any of the following conditions  . Increased pain, redness, drainage (pus) from your incision site. . Fever of 101 degrees or higher. . If you should develop stroke (slurred speech, difficulty swallowing, weakness on one side of your body, loss of vision) you should call 911 and go to the nearest emergency room. .  Reduce your risk of vascular disease:  . Stop smoking. If you would like help call [facility] at 1-800-QUIT-NOW (1-800-784-8669) or [place] at 336-586-4000. . Manage your cholesterol . Maintain a desired weight . Control your diabetes . Keep your blood pressure down .  If you have any questions, please call the office at [phone]. 

## 2021-04-23 NOTE — Progress Notes (Signed)
Approx 1300 Pt began complaining of pain in her right eye and ear.  Vision, pupil, and motor control of eye all WNL.  No other neuro changes noted.  MD made aware and came to assess Pt.  Will follow orders for increased pain control and monitor closely.

## 2021-04-23 NOTE — Anesthesia Procedure Notes (Addendum)
Arterial Line Insertion Start/End9/29/2022 7:15 AM, 04/23/2021 7:25 AM Performed by: Josephine Igo, CRNA, CRNA  Patient location: OR. Preanesthetic checklist: patient identified, IV checked, site marked, risks and benefits discussed, surgical consent, monitors and equipment checked, pre-op evaluation, timeout performed and anesthesia consent Lidocaine 1% used for infiltration Left, radial was placed Catheter size: 20 G Hand hygiene performed  and maximum sterile barriers used  Allen's test indicative of satisfactory collateral circulation Attempts: 2 Procedure performed without using ultrasound guided technique. Following insertion, Biopatch and dressing applied. Post procedure assessment: normal  Post procedure complications: local hematoma.

## 2021-04-23 NOTE — Anesthesia Postprocedure Evaluation (Signed)
Anesthesia Post Note  Patient: [name]  Procedure(s) Performed: RIGHT CAROTID ENDARTERECTOMY (Right: Neck) PATCH ANGIOPLASTY (Right: Neck)     Patient location during evaluation: PACU Anesthesia Type: General Level of consciousness: awake and alert Pain management: pain level controlled Vital Signs Assessment: post-procedure vital signs reviewed and stable Respiratory status: spontaneous breathing, nonlabored ventilation, respiratory function stable and patient connected to nasal cannula oxygen Cardiovascular status: blood pressure returned to baseline and stable Postop Assessment: no apparent nausea or vomiting Anesthetic complications: no   No notable events documented.  Last Vitals:  Vitals:   04/23/21 1500 04/23/21 1700  BP: 106/65 (!) 100/57  Pulse: (!) 57 60  Resp: 15 20  Temp:    SpO2: (!) 89% 98%    Last Pain:  Vitals:   04/23/21 1246  TempSrc: Oral  PainSc:                  Effie Berkshire

## 2021-04-23 NOTE — Interval H&P Note (Signed)
History and Physical Interval Note:  04/23/2021 7:29 AM  [name]  has presented today for surgery, with the diagnosis of SYMPTOMATIC CAROTID ARTERY STENOSIS.  The various methods of treatment have been discussed with the patient and family. After consideration of risks, benefits and other options for treatment, the patient has consented to  Procedure(s): RIGHT CAROTID ENDARTERECTOMY (Right) as a surgical intervention.  The patient's history has been reviewed, patient examined, no change in status, stable for surgery.  I have reviewed the patient's chart and labs.  Questions were answered to the patient's satisfaction.     Servando Snare

## 2021-04-23 NOTE — Op Note (Signed)
    Patient name: [name] MRN: [id number] DOB: May 24, 1946 Sex: female  04/23/2021 Pre-operative Diagnosis: Asymptomatic right ICA stenosis Post-operative diagnosis:  Same Surgeon:  Erlene Quan C. Donzetta Matters, MD Assistant: Melene Muller, MD Procedure Performed: Right carotid endarterectomy with bovine pericardial patch angioplasty with intraoperative carotid ultrasound  Indications: 75 year old female with asymptomatic right ICA stenosis identified on CTA confirmed with duplex.  She is indicated for carotid endarterectomy.  Assistant was necessary to facilitate exposure and expedite the case.  Findings: There was a focal 1 cm high-grade stenosis that was mixed soft and calcified plaque just after the bifurcation.  There was an ulceration in the common carotid artery as well.  We did have to place a tacking suture distally.  At completion there were low resistance waveforms by Doppler and ultrasound demonstrated patency proximally and distally with no areas of irregularity.   Procedure:  The patient was identified in the holding area and taken to the operating where she is placed upon the operative when general anesthesia was induced.  She was gently prepped and draped in the right neck and chest in usual fashion, antibiotics were minister timeout was called.  Incision was made along the anterior border the sternocleidomastoid.  We dissected down through the platysma down to the level of the sternocleidomastoid this was retracted laterally.  The IJ was identified.  Facial vein was divided between ties.  Common carotid was identified vagus nerve was identified and protected.  Patient was fully heparinized.  Umbilical tape was placed around the common carotid.  We did not find the hypoglossal nerve which was actually low riding.  Vesseloops placed around the proximal external carotid artery.  We dissected higher to the internal carotid artery just at the bend and placed Vesseloops around this.  We  confirmed ACT.  We prepared a 10 [place] shunt and bovine pericardial patch.  We then clamped the ICA followed by the common carotid and external carotid arteries in that order.  We opened the vessel longitudinally.  A shunt was placed distally allowed to backbleed and placed proximally.  We confirmed flow with Doppler.  We proceeded with endarterectomy.  We did have to place a tacking suture distally as well as a few tacking sutures where the artery was ulcerated in the common carotid artery.  Ultimately we had smooth tapering proximally and distally.  A bovine pericardial patch was fashioned and sewn in place with 6-0 Prolene suture.  Prior completion without flushing all directions.  We remove the shunt we had good backbleeding.  We then thoroughly irrigated with heparinized saline.  We completed the patch angioplasty.  We then released the clamp on the external carotid artery followed by the common and after several cardiac cycles we released the internal carotid artery clamp.  Doppler demonstrated low resistance waveforms distally.  We confirmed with ultrasound there were no irregularities within the carotid bulb or into the proximal ICA patch proximally distally was patent.  50 mg of protamine was administered.  We obtain hemostasis and irrigated.  We then closed in layers the platysma with Vicryl the skin with Monocryl.  Dermabond was placed at the skin level.  She was awakened from anesthesia she was noted be neurologically intact.  She was transferred to the recovery area in stable condition.  All counts were correct at completion.  EBL: 100 cc   Nicolena Schurman C. Donzetta Matters, MD [facility] Office: [phone] Pager: [phone]

## 2021-04-23 NOTE — Anesthesia Procedure Notes (Signed)
Procedure Name: Intubation Date/Time: 04/23/2021 7:43 AM Performed by: Wilburn Cornelia, CRNA Pre-anesthesia Checklist: Patient identified, Emergency Drugs available, Suction available, Timeout performed and Patient being monitored Patient Re-evaluated:Patient Re-evaluated prior to induction Oxygen Delivery Method: Circle system utilized Preoxygenation: Pre-oxygenation with 100% oxygen Induction Type: IV induction Ventilation: Mask ventilation without difficulty Laryngoscope Size: Mac and 3 Grade View: Grade I Tube type: Oral Tube size: 7.0 mm Number of attempts: 1 Airway Equipment and Method: Stylet Placement Confirmation: ETT inserted through vocal cords under direct vision, positive ETCO2, CO2 detector and breath sounds checked- equal and bilateral Secured at: 21 cm Tube secured with: Tape Dental Injury: Teeth and Oropharynx as per pre-operative assessment

## 2021-04-23 NOTE — Transfer of Care (Signed)
Immediate Anesthesia Transfer of Care Note  Patient: [name]  Procedure(s) Performed: RIGHT CAROTID ENDARTERECTOMY (Right: Neck) PATCH ANGIOPLASTY (Right: Neck)  Patient Location: PACU  Anesthesia Type:General  Level of Consciousness: awake, alert  and oriented  Airway & Oxygen Therapy: Patient Spontanous Breathing and Patient connected to nasal cannula oxygen  Post-op Assessment: Report given to RN, Post -op Vital signs reviewed and stable, Patient moving all extremities X 4 and Patient able to stick tongue midline  Post vital signs: Reviewed and stable  Last Vitals:  Vitals Value Taken Time  BP 116/63 04/23/21 0943  Temp    Pulse 63 04/23/21 0943  Resp 16 04/23/21 0943  SpO2 99 % 04/23/21 0943  Vitals shown include unvalidated device data. A line 139/51 Last Pain:  Vitals:   04/23/21 0630  TempSrc:   PainSc: 0-No pain         Complications: No notable events documented.

## 2021-04-23 NOTE — Progress Notes (Signed)
  Daily Progress Note  S/p: Right carotid endarterectomy  Subjective: Patient seen and examined at bedside postop.  Initially doing well, most recently however appreciated pain around her right eye and at her right ear.  No headache, no amaurosis, denies motor or sensory deficit  Objective: Vitals:   04/23/21 1246 04/23/21 1300  BP: 127/64 (!) 141/67  Pulse: (!) 58 60  Resp: 18 13  Temp: 97.8 F (36.6 C)   SpO2: 100% 100%    Physical Examination Patient with normal motor and sensory exam, cranial nerves intact Incision site dry, no hematoma, soft Pain in the soft tissue around the eye.  ASSESSMENT/PLAN:   Patient is postop day 0 status post right carotid endarterectomy.  She tolerated the case well. Pain in the soft tissue surrounding the right eye, describes deep inner ear pain.  No headache Patient symptoms not consistent with cerebral hyperperfusion. Will give multimodal analgesic medication in an effort to improve symptoms. Major concern would be new onset headache, change in mental status, sensorimotor exam, as these occur with cerebral hypoperfusion syndrome.  Please call immediately if these arise. Please keep blood pressure below 715 systolic   Cassandria Santee MD MS [facility] [phone] 04/23/2021  2:48 PM

## 2021-04-24 ENCOUNTER — Encounter (HOSPITAL_COMMUNITY): Payer: Self-pay | Admitting: Vascular Surgery

## 2021-04-24 DIAGNOSIS — Z9889 Other specified postprocedural states: Secondary | ICD-10-CM

## 2021-04-24 LAB — BASIC METABOLIC PANEL
Anion gap: 7 (ref 5–15)
BUN: 18 mg/dL (ref 8–23)
CO2: 25 mmol/L (ref 22–32)
Calcium: 8.3 mg/dL — ABNORMAL LOW (ref 8.9–10.3)
Chloride: 104 mmol/L (ref 98–111)
Creatinine, Ser: 1 mg/dL (ref 0.44–1.00)
GFR, Estimated: 59 mL/min — ABNORMAL LOW (ref >60)
Glucose, Bld: 118 mg/dL — ABNORMAL HIGH (ref 70–99)
Potassium: 4 mmol/L (ref 3.5–5.1)
Sodium: 136 mmol/L (ref 135–145)

## 2021-04-24 LAB — BPAM RBC
Blood Product Expiration Date: 202210262359
Blood Product Expiration Date: 202210262359
Unit Type and Rh: 6200
Unit Type and Rh: 6200

## 2021-04-24 LAB — CBC
HCT: 32.3 % — ABNORMAL LOW (ref 36.0–46.0)
Hemoglobin: 10.5 g/dL — ABNORMAL LOW (ref 12.0–15.0)
MCH: 30.1 pg (ref 26.0–34.0)
MCHC: 32.5 g/dL (ref 30.0–36.0)
MCV: 92.6 fL (ref 80.0–100.0)
Platelets: 189 10*3/uL (ref 150–400)
RBC: 3.49 MIL/uL — ABNORMAL LOW (ref 3.87–5.11)
RDW: 15.5 % (ref 11.5–15.5)
WBC: 9.6 10*3/uL (ref 4.0–10.5)
nRBC: 0 % (ref 0.0–0.2)

## 2021-04-24 LAB — LIPID PANEL
Cholesterol: 167 mg/dL (ref 0–200)
HDL: 38 mg/dL — ABNORMAL LOW (ref >40)
LDL Cholesterol: 103 mg/dL — ABNORMAL HIGH (ref 0–99)
Total CHOL/HDL Ratio: 4.4 RATIO
Triglycerides: 129 mg/dL (ref <150)
VLDL: 26 mg/dL (ref 0–40)

## 2021-04-24 LAB — TYPE AND SCREEN
ABO/RH(D): A POS
Antibody Screen: POSITIVE
Unit division: 0
Unit division: 0

## 2021-04-24 MED ORDER — TRAMADOL HCL 50 MG PO TABS: 50.0000 mg | ORAL_TABLET | Freq: Four times a day (QID) | ORAL | 0 refills | Status: DC | PRN

## 2021-04-24 MED ORDER — ATORVASTATIN CALCIUM 40 MG PO TABS: 40.0000 mg | ORAL_TABLET | Freq: Every day | ORAL | 4 refills | Status: DC

## 2021-04-24 MED ORDER — ATORVASTATIN CALCIUM 40 MG PO TABS: 40.0000 mg | ORAL_TABLET | Freq: Every day | ORAL | Status: DC

## 2021-04-24 NOTE — Discharge Summary (Signed)
Discharge Summary     [name] 10/06/1945 75 y.o. female  [id number]  Admission Date: 04/23/2021  Discharge Date: 04/24/21  Physician: Dr. Virl Cagey  Admission Diagnosis: Carotid stenosis, symptomatic w/o infarct, right [I65.21]  Discharge Day services:    See progress note 04/24/21  Hospital Course:  The patient was admitted to the hospital and taken to the operating room on 04/23/2021 and underwent right carotid endarterectomy due to asymptomatic high-grade stenosis.  The pt tolerated the procedure well and was transported to the PACU in good condition.   By POD 1, the pt neuro status remained at baseline.  She will be prescribed 1 to 2 days of narcotic pain medication for continued postoperative pain control.  She will follow-up in office in about 2 to 3 weeks for incision check.  She will be discharged home in stable condition.   Recent Labs    04/24/21 0331  NA 136  K 4.0  CL 104  CO2 25  GLUCOSE 118*  BUN 18  CALCIUM 8.3*   Recent Labs    04/23/21 1152 04/24/21 0331  WBC 8.5 9.6  HGB 10.8* 10.5*  HCT 33.1* 32.3*  PLT 191 189   Recent Labs    04/23/21 0601  INR 1.1       Discharge Diagnosis:  Carotid stenosis, symptomatic w/o infarct, right [I65.21]  Secondary Diagnosis: Patient Active Problem List   Diagnosis Date Noted   Carotid stenosis, symptomatic w/o infarct, right 04/23/2021   Bilateral primary osteoarthritis of knee 12/27/2017   Acute infection of nasal sinus 07/21/2017   Prediabetes 06/29/2017   Soft tissue mass 06/14/2017   Displaced fracture of fifth metatarsal bone of right foot 02/07/2017   Unilateral primary osteoarthritis, left knee 11/26/2016   Unilateral primary osteoarthritis, right knee 11/26/2016   Left leg pain    Abnormal MRI of head    Vision changes 04/13/2016   Orthostatic hypotension 03/10/2016   Vertigo 03/10/2016   Depression 11/25/2015   Essential (primary) hypertension 11/25/2015    Hyperlipidemia 11/25/2015   Temporary cerebral vascular dysfunction 11/25/2015   Primary malignant neoplasm ([place]) 11/25/2015   Sleep apnea 11/25/2015   Chronic anticoagulation 08/04/2015   History of anticoagulant therapy 08/04/2015   Pain in limb 12/26/2013   Chest pain 11/27/2013   GERD - post failed open Nissen 11/23/2012   Dysphagia, unspecified(787.20) 09/28/2012   Dysphagia 09/28/2012   S/P Open Nissen 1998 Dr. 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   Lupus anticoagulant positive 06/01/2011   Chest pain    Hx-TIA (transient ischemic attack)    Hypothyroidism    Breast cancer ([place])    History of DVT (deep vein thrombosis)    History of antiphospholipid antibody syndrome    Past Medical History:  Diagnosis Date   Anxiety    severe panic attacks   Arthritis    Aseptic meningitis    Asthma    as child   Breast cancer ([place])    right   Chronic anticoagulation 08/04/2015   Collapsed lung    hx of, left; following left thoracentesis for post-operative pleural effusion > 20 years ago   Complication of anesthesia    "hard to put asleep"; awareness under anesthesia > 35 years ago for hysterectomy   Depression    Fibromyalgia    GERD (gastroesophageal reflux disease)    H/O hiatal hernia    Headache(784.0)    Hepatitis 1990   "from  eating at restaurant"   History of antiphospholipid antibody syndrome    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   Hx-TIA (transient ischemic attack)    Hyperlipidemia    Hypertension    Hypothyroidism    Neuromuscular disorder ([place])    Personal history of radiation therapy    Pneumonia    hx of   Soft tissue mass 06/14/2017   3 cm right post axilla same side as previous breast cancer 06/14/17   Stroke ([place])    Thrombosis, upper extremity artery ([place]) 04/17/2012   Left digital artery  October 1998 - new dx antiphospholipid antibody  syndrome    Allergies as of 04/24/2021       Reactions   Bee Venom Anaphylaxis   Hornet Venom Anaphylaxis   Reglan [metoclopramide] Shortness Of Breath, Other (See Comments)   Tongue got numb; didn't feel good   Vitamin K Anaphylaxis, Rash   "kills me" iv   Baclofen    Dizziness   Cephalexin Other (See Comments)   Contrast Media [iodinated Diagnostic Agents] Other (See Comments)   Hydrocodone-acetaminophen Other (See Comments)   Benadryl [diphenhydramine Hcl] Rash   Ciprofloxacin Rash   Crestor [rosuvastatin Calcium] Other (See Comments)   Muscle Aches   Cymbalta [duloxetine Hcl] Swelling   Swelling of tongue and thrush. Does not sleep and cramps   Daypro [oxaprozin] Rash   Doxycycline Other (See Comments)   Tongue burning, thrush   Levofloxacin Rash   Sulfonamide Derivatives Rash   Zocor [simvastatin] Other (See Comments)   Muscle Aches        Medication List     STOP taking these medications    HYDROcodone-acetaminophen 5-325 MG tablet Commonly known as: NORCO/VICODIN   pravastatin 40 MG tablet Commonly known as: PRAVACHOL       TAKE these medications    ALPRAZolam 1 MG tablet Commonly known as: XANAX Take 1 mg by mouth 2 (two) times daily.   aspirin 81 MG tablet Take 81 mg by mouth every evening.   atorvastatin 40 MG tablet Commonly known as: LIPITOR Take 1 tablet (40 mg total) by mouth daily.   Calcium-Vitamin D 600-400 MG-UNIT Tabs Take 1 tablet by mouth daily.   enoxaparin 80 MG/0.8ML injection Commonly known as: LOVENOX Inject 0.8 mLs (80 mg total) into the skin every 12 (twelve) hours for 2 days.   fluticasone 50 MCG/ACT nasal spray Commonly known as: FLONASE Place 2 sprays into the nose daily as needed for allergies.   levothyroxine 112 MCG tablet Commonly known as: SYNTHROID Take 112 mcg by mouth See admin instructions. Take 112 mcg by mouth daily on Sun, Tues, Wed, Thurs, Sat   levothyroxine 125 MCG tablet Commonly known as:  SYNTHROID Take 125 mcg by mouth See admin instructions. Take 125 mcg by mouth on Monday and Friday   magnesium oxide 400 MG tablet Commonly known as: MAG-OX Take 400 mg by mouth daily.   metoprolol tartrate 25 MG tablet Commonly known as: LOPRESSOR Take 12.5 mg by mouth 2 (two) times daily.   multivitamin with minerals Tabs tablet Take 1 tablet by mouth daily.   omeprazole 20 MG capsule Commonly known as: PRILOSEC Take 20 mg by mouth daily.   sertraline 100 MG tablet Commonly known as: ZOLOFT Take 100 mg by mouth daily.   traMADol 50 MG tablet Commonly known as: ULTRAM Take 1 tablet (50 mg total) by mouth every 6 (six) hours as needed for moderate pain.  Vitamin B 12 500 MCG Tabs Take 500 mcg by mouth daily.   vitamin C 1000 MG tablet Take 1,000 mg by mouth daily.   warfarin 5 MG tablet Commonly known as: COUMADIN Take as directed. If you are unsure how to take this medication, talk to your nurse or doctor. Original instructions: Take 5 mg daily except on Mondays: take 2.5 mg (1/2 tablet) What changed: additional instructions         Discharge Instructions:   [facility] Discharge Instructions Carotid Endarterectomy (CEA)  Please refer to the following instructions for your post-procedure care. Your surgeon or physician assistant will discuss any changes with you.  Activity  You are encouraged to walk as much as you can. You can slowly return to normal activities but must avoid strenuous activity and heavy lifting until your doctor tell you it's OK. Avoid activities such as vacuuming or swinging a golf club. You can drive after one week if you are comfortable and you are no longer taking prescription pain medications. It is normal to feel tired for serval weeks after your surgery. It is also normal to have difficulty with sleep habits, eating, and bowel movements after surgery. These will go away with  time.  Bathing/Showering  You may shower after you come home. Do not soak in a bathtub, hot tub, or swim until the incision heals completely.  Incision Care  Shower every day. Clean your incision with mild soap and water. Pat the area dry with a clean towel. You do not need a bandage unless otherwise instructed. Do not apply any ointments or creams to your incision. You may have skin glue on your incision. Do not peel it off. It will come off on its own in about one week. Your incision may feel thickened and raised for several weeks after your surgery. This is normal and the skin will soften over time. For Men Only: It's OK to shave around the incision but do not shave the incision itself for 2 weeks. It is common to have numbness under your chin that could last for several months.  Diet  Resume your normal diet. There are no special food restrictions following this procedure. A low fat/low cholesterol diet is recommended for all patients with vascular disease. In order to heal from your surgery, it is CRITICAL to get adequate nutrition. Your body requires vitamins, minerals, and protein. Vegetables are the best source of vitamins and minerals. Vegetables also provide the perfect balance of protein. Processed food has little nutritional value, so try to avoid this.  Medications  Resume taking all of your medications unless your doctor or physician assistant tells you not to.  If your incision is causing pain, you may take over-the- counter pain relievers such as acetaminophen (Tylenol). If you were prescribed a stronger pain medication, please be aware these medications can cause nausea and constipation.  Prevent nausea by taking the medication with a snack or meal. Avoid constipation by drinking plenty of fluids and eating foods with a high amount of fiber, such as fruits, vegetables, and grains. Do not take Tylenol if you are taking prescription pain medications.  Follow Up  Our office will  schedule a follow up appointment 2-3 weeks following discharge.  Please call us immediately for any of the following conditions  Increased pain, redness, drainage (pus) from your incision site. Fever of 101 degrees or higher. If you should develop stroke (slurred speech, difficulty swallowing, weakness on one side of  your body, loss of vision) you should call 911 and go to the nearest emergency room.  Reduce your risk of vascular disease:  Stop smoking. If you would like help call [facility] at 1-800-QUIT-NOW 640-225-1989) or [place] at (563)019-0404. Manage your cholesterol Maintain a desired weight Control your diabetes Keep your blood pressure down  If you have any questions, please call the office at [phone].   Disposition: home  Patient's condition: is Good  Follow up: 1. Dr. Virl Cagey in 2 weeks.   Dagoberto Ligas, PA-C [facility] [phone]   --- For [facility] use ---   Modified Rankin score at D/C (0-6): 0  IV medication needed for:  1. Hypertension: No 2. Hypotension: No  Post-op Complications: No  1. Post-op CVA or TIA: No  If yes: Event classification (right eye, left eye, right cortical, left cortical, verterobasilar, other):   If yes: Timing of event (intra-op, <6 hrs post-op, >=6 hrs post-op, unknown):   2. CN injury: No  If yes: CN  injuried   3. Myocardial infarction: No  If yes: Dx by (EKG or clinical, Troponin):   4.  CHF: No  5.  Dysrhythmia (new): No  6. Wound infection: No  7. Reperfusion symptoms: No  8. Return to OR: No  If yes: return to OR for (bleeding, neurologic, other CEA incision, other):   Discharge medications: Statin use:  Yes ASA use:  Yes   Beta blocker use:  No ACE-Inhibitor use:  No  ARB use:  No CCB use: No P2Y12 Antagonist use: No, [ ]  Plavix, [ ]  Plasugrel, [ ]  Ticlopinine, [ ]  Ticagrelor, [ ]  Other, [ ]  No for medical reason, [ ]  Non-compliant, [ ]   Not-indicated Anti-coagulant use:  No, [ ]  Warfarin, [ ]  Rivaroxaban, [ ]  Dabigatran,

## 2021-04-24 NOTE — Progress Notes (Signed)
Pt ambulated x 350 feet pt tolerated well/

## 2021-04-24 NOTE — Progress Notes (Addendum)
PHARMACIST LIPID MONITORING   [name] is a 75 y.o. female admitted on 04/23/2021 with carotid artery stenosis.  Pharmacy has been consulted to optimize lipid-lowering therapy with the indication of secondary prevention for clinical ASCVD.  Recent Labs:  Lipid Panel (last 6 months):   Lab Results  Component Value Date   CHOL 167 04/24/2021   TRIG 129 04/24/2021   HDL 38 (L) 04/24/2021   CHOLHDL 4.4 04/24/2021   VLDL 26 04/24/2021   LDLCALC 103 (H) 04/24/2021    Hepatic function panel (last 6 months):   Lab Results  Component Value Date   AST 34 04/20/2021   ALT 23 04/20/2021   ALKPHOS 80 04/20/2021   BILITOT 0.8 04/20/2021    SCr (since admission):   Serum creatinine: 1 mg/dL 04/24/21 0331 Estimated creatinine clearance: 51.7 mL/min  Current therapy and lipid therapy tolerance Current lipid-lowering therapy: pravastatin 40mg   Previous lipid-lowering therapies (if applicable): simvastatin and rosuvastatin Documented or reported allergies or intolerances to lipid-lowering therapies (if applicable): simvastatin (muscle aches) , rosuvastatin (muscle aches)  Assessment:   Patient's lipid levels remain elevated despite current pravastatin therapy. Patient would benefit from high-intensity statin based on risk factors. Patient has had reported muscle aches from rosuvastatin and simvastatin in the past. Discussed pros/cons of changing to another statin therapy to optimize and further decrease LDL levels. Patient is agreeable to changing to atorvastatin.   Patient agrees with changes to lipid-lowering therapy  Plan:    1.Statin intensity (high intensity recommended for all patients regardless of the LDL):  Add or increase statin to high intensity.  - Patient agreeable to changing to atorvastatin 40mg  once daily  2.Add ezetimibe (if any one of the following):   Not indicated at this time.  3.Refer to lipid clinic:   No  4.Follow-up with:  Primary care provider -  [name]., PA-C  5.Follow-up labs after discharge:  Changes in lipid therapy were made. Check a lipid panel in 8-12 weeks then annually.      Thank you for allowing pharmacy to be a part of this patient's care.  Ardyth Harps, PharmD Clinical Pharmacist

## 2021-04-24 NOTE — Progress Notes (Addendum)
Progress Note    04/24/2021 7:42 AM 1 Day Post-Op  Subjective:  moving extremities well.  Some R lower lip weakness, sore throat.   Vitals:   04/24/21 0200 04/24/21 0300  BP: 111/60 105/60  Pulse:  (!) 57  Resp: 16 15  Temp:  98 F (36.7 C)  SpO2:  96%   Physical Exam: Cardiac:  RRR Lungs:  non labored on RA Incisions:  R neck incision c/d/I without hematoma Extremities:  moving all extremities well Abdomen:  soft Neurologic: CN grossly intact; R lower lip weakness  CBC    Component Value Date/Time   WBC 9.6 04/24/2021 0331   RBC 3.49 (L) 04/24/2021 0331   HGB 10.5 (L) 04/24/2021 0331   HGB 13.3 09/19/2018 1203   HGB 13.0 02/12/2015 1122   HCT 32.3 (L) 04/24/2021 0331   HCT 41.3 09/19/2018 1203   HCT 38.8 02/12/2015 1122   PLT 189 04/24/2021 0331   PLT 258 09/19/2018 1203   MCV 92.6 04/24/2021 0331   MCV 93 09/19/2018 1203   MCV 92.8 02/12/2015 1122   MCH 30.1 04/24/2021 0331   MCHC 32.5 04/24/2021 0331   RDW 15.5 04/24/2021 0331   RDW 13.4 09/19/2018 1203   RDW 14.0 02/12/2015 1122   LYMPHSABS 2.8 03/25/2021 2336   LYMPHSABS 2.3 09/19/2018 1203   LYMPHSABS 2.9 02/12/2015 1122   MONOABS 0.6 03/25/2021 2336   MONOABS 0.6 02/12/2015 1122   EOSABS 0.2 03/25/2021 2336   EOSABS 0.2 09/19/2018 1203   BASOSABS 0.1 03/25/2021 2336   BASOSABS 0.1 09/19/2018 1203   BASOSABS 0.1 02/12/2015 1122    BMET    Component Value Date/Time   NA 136 04/24/2021 0331   NA 140 09/19/2018 1203   NA 140 04/09/2014 1513   K 4.0 04/24/2021 0331   K 3.9 04/09/2014 1513   CL 104 04/24/2021 0331   CL 102 10/19/2012 1301   CO2 25 04/24/2021 0331   CO2 24 04/09/2014 1513   GLUCOSE 118 (H) 04/24/2021 0331   GLUCOSE 131 04/09/2014 1513   GLUCOSE 160 (H) 10/19/2012 1301   BUN 18 04/24/2021 0331   BUN 24 09/19/2018 1203   BUN 16.8 04/09/2014 1513   CREATININE 1.00 04/24/2021 0331   CREATININE 1.4 (H) 04/09/2014 1513   CALCIUM 8.3 (L) 04/24/2021 0331   CALCIUM 8.7  04/09/2014 1513   GFRNONAA 59 (L) 04/24/2021 0331   GFRAA 49 (L) 01/21/2020 1748    INR    Component Value Date/Time   INR 1.1 04/23/2021 0601   INR 2.50 03/13/2015 1116     Intake/Output Summary (Last 24 hours) at 04/24/2021 0742 Last data filed at 04/24/2021 0600 Gross per 24 hour  Intake 1445 ml  Output 2000 ml  Net -555 ml     Assessment/Plan:  75 y.o. female is s/p R CEA for asymptomatic stenosis 1 Day Post-Op   Neuro exam remains at baseline [facility] for discharge home this morning Follow up in office in 2-3 weeks for incision check  VASCULAR STAFF ADDENDUM: I have independently interviewed and examined the patient. I agree with the above.  No CN deficits, marginal mandibular nerve palsy from retraction, will resolved with time.  Able to smile Eye pain resolved, Ear pain less than previous Sensory/motor intact.   Home today    Cassandria Santee, MD [facility] Phone Number: [phone] 04/24/2021 10:49 AM     Dagoberto Ligas, PA-C [facility] [phone] 04/24/2021 7:42  AM    

## 2021-04-24 NOTE — Progress Notes (Signed)
Discharge instructions (including medications) discussed with and copy provided to patient/caregiver 

## 2021-04-24 NOTE — Plan of Care (Signed)
  Problem: Education: Goal: Knowledge of General Education information will improve Description: Including pain rating scale, medication(s)/side effects and non-pharmacologic comfort measures Outcome: Adequate for Discharge   

## 2021-04-25 LAB — POCT ACTIVATED CLOTTING TIME
Activated Clotting Time: 242 seconds
Activated Clotting Time: 237 seconds

## 2021-05-01 ENCOUNTER — Telehealth: Payer: Self-pay

## 2021-05-01 NOTE — Telephone Encounter (Signed)
Patient calls today to report elevated BP in the 140s and a heart rate around 100-100. Says due to lupus, medications don't always work as expected for her and she was just switched from pravastatin to atorvastatin. Pravastatin did not bother her, but she thinks the atorvastatin is causing HR and BP issues. She denies any other problems. Discussed with PA, advised patient she could stop the atorvastatin to see if that helped and scheduled a post op follow up appointment.

## 2021-05-04 ENCOUNTER — Other Ambulatory Visit: Payer: Self-pay | Admitting: Family Medicine

## 2021-05-04 DIAGNOSIS — Z1231 Encounter for screening mammogram for malignant neoplasm of breast: Secondary | ICD-10-CM

## 2021-05-05 ENCOUNTER — Other Ambulatory Visit: Payer: Self-pay | Admitting: Family Medicine

## 2021-05-05 DIAGNOSIS — N6310 Unspecified lump in the right breast, unspecified quadrant: Secondary | ICD-10-CM

## 2021-05-13 ENCOUNTER — Ambulatory Visit (INDEPENDENT_AMBULATORY_CARE_PROVIDER_SITE_OTHER): Payer: Medicare HMO | Admitting: Physician Assistant

## 2021-05-13 ENCOUNTER — Encounter: Payer: Self-pay | Admitting: Physician Assistant

## 2021-05-13 ENCOUNTER — Other Ambulatory Visit: Payer: Self-pay

## 2021-05-13 VITALS — BP 124/76 | HR 67 | Temp 98.2°F | Ht 65.0 in | Wt 186.0 lb

## 2021-05-13 DIAGNOSIS — I6521 Occlusion and stenosis of right carotid artery: Secondary | ICD-10-CM

## 2021-05-13 NOTE — Progress Notes (Signed)
POST OPERATIVE OFFICE NOTE    CC:  F/u for surgery  HPI:  This is a 75 y.o. female who is s/p right carotid endarterectomy by Dr. Donzetta Matters on 04/23/21 for asymptomatic carotid stenosis. She tolerated the surgery well, remained neurologically intact and was discharged post operative day 1.   She reports she has been doing well. She is still having some tenderness along incision and some numbness under her chin. She denies amaurosis fugax or other visual changes, slurred speech, facial drooping, unilateral upper or lower extremity weakness or numbness. She says she has slowly been increasing her activity. She stayed at her sons for 2 weeks after surgery and then has since returned home. 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 She is on Warfarin, Aspirin and Lipitor   Allergies  Allergen Reactions   Bee Venom Anaphylaxis   Hornet Venom Anaphylaxis   Reglan [Metoclopramide] Shortness Of Breath and Other (See Comments)    Tongue got numb; didn't feel good   Vitamin K Anaphylaxis and Rash    "kills me" iv   Baclofen     Dizziness   Cephalexin Other (See Comments)   Contrast Media [Iodinated Diagnostic Agents] Other (See Comments)   Hydrocodone-Acetaminophen Other (See Comments)   Benadryl [Diphenhydramine Hcl] Rash   Ciprofloxacin Rash   Crestor [Rosuvastatin Calcium] Other (See Comments)    Muscle Aches   Cymbalta [Duloxetine Hcl] Swelling    Swelling of tongue and thrush. Does not sleep and cramps   Daypro [Oxaprozin] Rash   Doxycycline Other (See Comments)    Tongue burning, thrush   Levofloxacin Rash   Sulfonamide Derivatives Rash   Zocor [Simvastatin] Other (See Comments)    Muscle Aches    Current Outpatient Medications  Medication Sig Dispense Refill   ALPRAZolam (XANAX) 1 MG tablet Take 1 mg by mouth 2 (two) times daily.      Ascorbic Acid (VITAMIN C) 1000 MG tablet Take 1,000 mg by mouth daily.     aspirin 81 MG tablet Take 81 mg by mouth every evening.      Calcium Carb-Cholecalciferol (CALCIUM-VITAMIN D) 600-400 MG-UNIT TABS Take 1 tablet by mouth daily.     Cyanocobalamin (VITAMIN B 12) 500 MCG TABS Take 500 mcg by mouth daily.     fluticasone (FLONASE) 50 MCG/ACT nasal spray Place 2 sprays into the nose daily as needed for allergies.      levothyroxine (SYNTHROID, LEVOTHROID) 112 MCG tablet Take 112 mcg by mouth See admin instructions. Take 112 mcg by mouth daily on Sun, Tues, Wed, Thurs, Sat     levothyroxine (SYNTHROID, LEVOTHROID) 125 MCG tablet Take 125 mcg by mouth See admin instructions. Take 125 mcg by mouth on Monday and Friday     magnesium oxide (MAG-OX) 400 MG tablet Take 400 mg by mouth daily.     metoprolol tartrate (LOPRESSOR) 25 MG tablet Take 12.5 mg by mouth 2 (two) times daily.     Multiple Vitamin (MULTIVITAMIN WITH MINERALS) TABS tablet Take 1 tablet by mouth daily.     omeprazole (PRILOSEC) 20 MG capsule Take 20 mg by mouth daily.      oxyCODONE-acetaminophen (PERCOCET/ROXICET) 5-325 MG tablet Take 1 tablet by mouth every 4 (four) hours as needed for severe pain.     pravastatin (PRAVACHOL) 40 MG tablet Take 40 mg by mouth daily.     sertraline (ZOLOFT) 100 MG tablet Take 100 mg by mouth daily.     warfarin (COUMADIN) 5 MG  tablet Take 5 mg daily except on Mondays: take 2.5 mg (1/2 tablet) (Patient taking differently: Take 1.5 tablets (7.5 mg) on ( Mon. Fri. & Sun) & Take 1 tablet (5 mg) on Tues. Thurs. & Sat.) 90 tablet 3   atorvastatin (LIPITOR) 40 MG tablet Take 1 tablet (40 mg total) by mouth daily. (Patient not taking: Reported on 05/13/2021) 90 tablet 4   enoxaparin (LOVENOX) 80 MG/0.8ML injection Inject 0.8 mLs (80 mg total) into the skin every 12 (twelve) hours for 2 days. 3.2 mL 0   traMADol (ULTRAM) 50 MG tablet Take 1 tablet (50 mg total) by mouth every 6 (six) hours as needed for moderate pain. (Patient not taking: Reported on 05/13/2021) 20 tablet 0   No current facility-administered medications for this visit.      ROS:  See HPI  Physical Exam:  Vitals:   05/13/21 1507  BP: 124/76  Pulse: 67  Temp: 98.2 F (36.8 C)  TempSrc: Skin  SpO2: 96%  Weight: 186 lb (84.4 kg)  Height: 5\' 5"  (1.651 m)    General: well appearing,  well nourished, in no distress Cardiac: regular rate and rhythm Lungs: clear to auscultation bilaterally Incision:  right neck incision is healing nicely. She still has Dermabond in place. No swelling. No hematoma Extremities:  moving all extremities without deficit. 2+ radial and pedal pulses bilaterally Neuro: CN intact. Speech coherent. Smile symmetric. Tongue midline. Sensation is equal and intact bilaterally   Assessment/Plan:  This is a 75 y.o. female who is s/p right carotid endarterectomy by Dr. Donzetta Matters on 04/23/21 for asymptomatic carotid stenosis. She is doing well post op. Her incision is well appearing and healing nicely. She is not having any new neurological symptoms. Reassured her about her tenderness and numbness along incision that this is common and will likely improve with time.  - continue Coumadin, Aspirin, Statin - she will follow up earlier if she has new or concerning symptoms. Reviewed TIA/Stroke symptoms with her and she understands should these occur she needs to call 911 or go to ER - She will follow up in 9 months with carotid duplex   Karoline Caldwell, PA-C [facility] [phone]  Clinic MD:  [facility]

## 2021-05-15 ENCOUNTER — Other Ambulatory Visit: Payer: Self-pay

## 2021-05-15 DIAGNOSIS — I6521 Occlusion and stenosis of right carotid artery: Secondary | ICD-10-CM

## 2021-05-29 ENCOUNTER — Ambulatory Visit
Admission: RE | Admit: 2021-05-29 | Discharge: 2021-05-29 | Disposition: A | Discharge status: Home / Self Care | Payer: Medicare HMO | Source: Ambulatory Visit | Attending: Family Medicine | Admitting: Family Medicine

## 2021-05-29 ENCOUNTER — Other Ambulatory Visit: Payer: Self-pay

## 2021-05-29 DIAGNOSIS — N6310 Unspecified lump in the right breast, unspecified quadrant: Secondary | ICD-10-CM

## 2021-06-30 ENCOUNTER — Telehealth: Payer: Self-pay

## 2021-06-30 NOTE — Telephone Encounter (Signed)
Pt called with c/o increased swelling above incision s/p R CEA on 04/23/21. She noticed she has been off balance the last two days or so. She says she gets "a little more choked up some". She denies any one sided weakness, no breathing difficulties. She has been given an appt tomorrow morning and has been advised on the s/s of stroke and that if she experiences any of them to call 911 or report to ED immediately. Pt will call us back if anything changes/worsens before her appt. No further questions/concerns at this time.

## 2021-07-01 ENCOUNTER — Ambulatory Visit: Payer: Medicare HMO | Admitting: Physician Assistant

## 2021-07-01 ENCOUNTER — Other Ambulatory Visit: Payer: Self-pay

## 2021-07-01 VITALS — BP 122/65 | HR 57 | Temp 97.6°F | Resp 20 | Ht 65.0 in | Wt 178.8 lb

## 2021-07-01 DIAGNOSIS — Z860100 Personal history of colon polyps, unspecified: Secondary | ICD-10-CM | POA: Insufficient documentation

## 2021-07-01 DIAGNOSIS — R300 Dysuria: Secondary | ICD-10-CM | POA: Insufficient documentation

## 2021-07-01 DIAGNOSIS — R760 Raised antibody titer: Secondary | ICD-10-CM | POA: Insufficient documentation

## 2021-07-01 DIAGNOSIS — B001 Herpesviral vesicular dermatitis: Secondary | ICD-10-CM | POA: Insufficient documentation

## 2021-07-01 DIAGNOSIS — R252 Cramp and spasm: Secondary | ICD-10-CM | POA: Insufficient documentation

## 2021-07-01 DIAGNOSIS — Z91038 Other insect allergy status: Secondary | ICD-10-CM | POA: Insufficient documentation

## 2021-07-01 DIAGNOSIS — I6521 Occlusion and stenosis of right carotid artery: Secondary | ICD-10-CM

## 2021-07-01 DIAGNOSIS — R933 Abnormal findings on diagnostic imaging of other parts of digestive tract: Secondary | ICD-10-CM | POA: Insufficient documentation

## 2021-07-01 DIAGNOSIS — R7989 Other specified abnormal findings of blood chemistry: Secondary | ICD-10-CM | POA: Insufficient documentation

## 2021-07-01 DIAGNOSIS — R0781 Pleurodynia: Secondary | ICD-10-CM | POA: Insufficient documentation

## 2021-07-01 DIAGNOSIS — R1310 Dysphagia, unspecified: Secondary | ICD-10-CM | POA: Diagnosis not present

## 2021-07-01 DIAGNOSIS — K921 Melena: Secondary | ICD-10-CM | POA: Insufficient documentation

## 2021-07-01 DIAGNOSIS — Z0000 Encounter for general adult medical examination without abnormal findings: Secondary | ICD-10-CM | POA: Insufficient documentation

## 2021-07-01 DIAGNOSIS — R1031 Right lower quadrant pain: Secondary | ICD-10-CM | POA: Insufficient documentation

## 2021-07-01 DIAGNOSIS — Z800 Family history of malignant neoplasm of digestive organs: Secondary | ICD-10-CM | POA: Insufficient documentation

## 2021-07-01 DIAGNOSIS — G2581 Restless legs syndrome: Secondary | ICD-10-CM | POA: Insufficient documentation

## 2021-07-01 DIAGNOSIS — W57XXXA Bitten or stung by nonvenomous insect and other nonvenomous arthropods, initial encounter: Secondary | ICD-10-CM | POA: Insufficient documentation

## 2021-07-01 DIAGNOSIS — F419 Anxiety disorder, unspecified: Secondary | ICD-10-CM | POA: Insufficient documentation

## 2021-07-01 DIAGNOSIS — Z23 Encounter for immunization: Secondary | ICD-10-CM | POA: Insufficient documentation

## 2021-07-01 DIAGNOSIS — R634 Abnormal weight loss: Secondary | ICD-10-CM | POA: Insufficient documentation

## 2021-07-01 DIAGNOSIS — N390 Urinary tract infection, site not specified: Secondary | ICD-10-CM | POA: Insufficient documentation

## 2021-07-01 DIAGNOSIS — M940 Chondrocostal junction syndrome [Tietze]: Secondary | ICD-10-CM | POA: Insufficient documentation

## 2021-07-01 DIAGNOSIS — L0390 Cellulitis, unspecified: Secondary | ICD-10-CM | POA: Insufficient documentation

## 2021-07-01 NOTE — Progress Notes (Signed)
POST OPERATIVE OFFICE NOTE    CC:  F/u for surgery  HPI:  This is a 75 y.o. female who is s/p right CEA on 04/23/21 by Dr. Donzetta Matters.  She was asymptomatic post op after right CEA.  She denied symptoms of swallowing difficulties, neurologic deficits, or hoarseness.  She states she was diagnosed with the Flu and URI about 2-3 weeks ago.  She had new symptoms of right ant/lateral neck tenderness to palpation right sided, hoarseness and swallowing difficulties 2 days ago.  She has a history dysphagia that has required esophageal stretching > 5 years ago.  She was concerned because her symptoms have been on the right side of her neck for tenderness and that's the surgery side.    Allergies  Allergen Reactions   Bee Venom Anaphylaxis   Hornet Venom Anaphylaxis   Reglan [Metoclopramide] Shortness Of Breath and Other (See Comments)    Tongue got numb; didn't feel good   Vitamin K Anaphylaxis and Rash    "kills me" iv   Baclofen     Dizziness   Cephalexin Other (See Comments)   Contrast Media [Iodinated Diagnostic Agents] Other (See Comments)   Hydrocodone-Acetaminophen Other (See Comments)   Benadryl [Diphenhydramine Hcl] Rash   Ciprofloxacin Rash   Crestor [Rosuvastatin Calcium] Other (See Comments)    Muscle Aches   Cymbalta [Duloxetine Hcl] Swelling    Swelling of tongue and thrush. Does not sleep and cramps   Daypro [Oxaprozin] Rash   Doxycycline Other (See Comments)    Tongue burning, thrush   Levofloxacin Rash   Sulfonamide Derivatives Rash   Zocor [Simvastatin] Other (See Comments)    Muscle Aches    Current Outpatient Medications  Medication Sig Dispense Refill   ALPRAZolam (XANAX) 1 MG tablet Take 1 mg by mouth 2 (two) times daily.      Ascorbic Acid (VITAMIN C) 1000 MG tablet Take 1,000 mg by mouth daily.     aspirin 81 MG tablet Take 81 mg by mouth every evening.     Calcium Carb-Cholecalciferol (CALCIUM-VITAMIN D) 600-400 MG-UNIT TABS Take 1 tablet by mouth daily.      Cyanocobalamin (VITAMIN B 12) 500 MCG TABS Take 500 mcg by mouth daily.     fluticasone (FLONASE) 50 MCG/ACT nasal spray Place 2 sprays into the nose daily as needed for allergies.      levothyroxine (SYNTHROID, LEVOTHROID) 112 MCG tablet Take 112 mcg by mouth See admin instructions. Take 112 mcg by mouth daily on Sun, Tues, Wed, Thurs, Sat     levothyroxine (SYNTHROID, LEVOTHROID) 125 MCG tablet Take 125 mcg by mouth See admin instructions. Take 125 mcg by mouth on Monday and Friday     magnesium oxide (MAG-OX) 400 MG tablet Take 400 mg by mouth daily.     metoprolol tartrate (LOPRESSOR) 25 MG tablet Take 12.5 mg by mouth 2 (two) times daily.     Multiple Vitamin (MULTIVITAMIN WITH MINERALS) TABS tablet Take 1 tablet by mouth daily.     omeprazole (PRILOSEC) 20 MG capsule Take 20 mg by mouth daily.      oxyCODONE-acetaminophen (PERCOCET/ROXICET) 5-325 MG tablet Take 1 tablet by mouth every 4 (four) hours as needed for severe pain.     pravastatin (PRAVACHOL) 40 MG tablet Take 40 mg by mouth daily.     sertraline (ZOLOFT) 100 MG tablet Take 100 mg by mouth daily.     traMADol (ULTRAM) 50 MG tablet Take 1 tablet (50 mg total) by mouth every 6 (six)  hours as needed for moderate pain. 20 tablet 0   warfarin (COUMADIN) 5 MG tablet Take 5 mg daily except on Mondays: take 2.5 mg (1/2 tablet) (Patient taking differently: Take 1.5 tablets (7.5 mg) on ( Mon. Fri. & Sun) & Take 1 tablet (5 mg) on Tues. Thurs. & Sat.) 90 tablet 3   atorvastatin (LIPITOR) 40 MG tablet Take 1 tablet (40 mg total) by mouth daily. (Patient not taking: Reported on 07/01/2021) 90 tablet 4   No current facility-administered medications for this visit.     ROS:  See HPI  Physical Exam:  Vitals:   07/01/21 1036  BP: 122/65  Pulse: (!) 57  Resp: 20  Temp: 97.6 F (36.4 C)  SpO2: 98%     Incision:  well healed without hematoma, erythema or apparent edema.  She is tender to palpation right anterior lateral neck. Lungs: Her  speech is clear with hoarseness Breath sounds clear Heart RRR Extremities:  palpable radial pulses equal B  Abdomen:  soft NTTP   Assessment/Plan:  This is a 75 y.o. female who is s/p:S/P right CEA by Dr. Donzetta Matters 04/23/21. Her post op course has been uneventful.  Her symptoms may be related to her recent illness and remote dysphasia due to esophageal dysmotility.    She will f/u with her primary care.  If her symptoms persist and she feels ill she should go to the ER.  -We have scheduled a f/u in 2 weeks for carotid duplex.  Dr. Donzetta Matters did see the patient in conjunction today and agrees with the plan.     Roxy Horseman PA-C [facility] [phone]   Clinic MD:  Donzetta Matters

## 2021-07-15 ENCOUNTER — Ambulatory Visit (HOSPITAL_COMMUNITY)
Admission: RE | Admit: 2021-07-15 | Discharge: 2021-07-15 | Disposition: A | Discharge status: Home / Self Care | Payer: Medicare HMO | Source: Ambulatory Visit | Attending: Vascular Surgery | Admitting: Vascular Surgery

## 2021-07-15 ENCOUNTER — Other Ambulatory Visit: Payer: Self-pay

## 2021-07-15 ENCOUNTER — Ambulatory Visit: Payer: Medicare HMO | Admitting: Physician Assistant

## 2021-07-15 ENCOUNTER — Encounter: Payer: Self-pay | Admitting: Physician Assistant

## 2021-07-15 ENCOUNTER — Telehealth: Payer: Self-pay

## 2021-07-15 VITALS — BP 135/72 | HR 67 | Temp 97.7°F | Resp 20 | Ht 65.0 in | Wt 177.8 lb

## 2021-07-15 DIAGNOSIS — I6521 Occlusion and stenosis of right carotid artery: Secondary | ICD-10-CM | POA: Insufficient documentation

## 2021-07-15 DIAGNOSIS — I6529 Occlusion and stenosis of unspecified carotid artery: Secondary | ICD-10-CM

## 2021-07-15 NOTE — Telephone Encounter (Signed)
° °  Pre-operative Risk Assessment    Patient Name: [name]  DOB: 19-Jul-1946 MRN: [id number]      Request for Surgical Clearance    Procedure:   colonoscopy  Date of Surgery:  Clearance 10/15/21                                 Surgeon:  DR Watt Climes Surgeon's Group or Practice Name:  [facility] Phone number:  [phone] Fax number:  [phone]   Type of Clearance Requested:   - Pharmacy:  Hold Warfarin (Coumadin)     Type of Anesthesia:   PROPOFOL   Additional requests/questions:

## 2021-07-15 NOTE — Progress Notes (Signed)
POST OPERATIVE OFFICE NOTE    CC:  F/u for surgery  HPI:  This is a 75 y.o. female who is s/p right CEA on 04/23/2021 for asymptomatic carotid artery stenosis by Dr. Donzetta Matters and had an unremarkable post op course.    She was seen on 07/01/2021 and stated she had been diagnosed with the flu and URI 2-3 weeks prior.  She had started having right anterior lateral neck tenderness to palpation, hoarseness and swallowing difficulties.  She was concerned and came in to be seen.  It was felt her sx may be related to her recent illness or her remote dysphasia due to esophageal dysmotility.  She was also scheduled for carotid duplex given the pain was on the operative side and comes in today for that visit.   Pt returns today for follow up.  Pt states that she continues to have trouble swallowing and some hoarseness but states that this is exactly her symptoms that she has when she needs an esophageal dilation.  She states that they will not do her dilation until March and she feels she cannot wait that long.   She denies any amaurosis fugax, speech issues except hoarseness or unilateral weakness, numbness or paralysis.    She states she has been having some pain in her legs.  She describes it as a sharp pain on her inner thighs and some cramping in the calf.  She states it does not happen every day and it is not related to anything specific.  She states she used to get cramps at night but started a new chewable med and these have much improved.  She did have a venous duplex in 2015 that revealed mild deep reflux on the right.  She is on  coumadin and has been mostly therapeutic with this.  She denies any chest pain or shortness of breath.  She does not get claudication sx.  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  She states she is going to have to have breast surgery as she has staples that are surfacing from her surgery 20 years ago.   Allergies  Allergen Reactions   Bee  Venom Anaphylaxis   Hornet Venom Anaphylaxis   Reglan [Metoclopramide] Shortness Of Breath and Other (See Comments)    Tongue got numb; didn't feel good   Vitamin K Anaphylaxis and Rash    "kills me" iv   Baclofen     Dizziness   Cephalexin Other (See Comments)   Contrast Media [Iodinated Diagnostic Agents] Other (See Comments)   Hydrocodone-Acetaminophen Other (See Comments)   Benadryl [Diphenhydramine Hcl] Rash   Ciprofloxacin Rash   Crestor [Rosuvastatin Calcium] Other (See Comments)    Muscle Aches   Cymbalta [Duloxetine Hcl] Swelling    Swelling of tongue and thrush. Does not sleep and cramps   Daypro [Oxaprozin] Rash   Doxycycline Other (See Comments)    Tongue burning, thrush   Levofloxacin Rash   Sulfonamide Derivatives Rash   Zocor [Simvastatin] Other (See Comments)    Muscle Aches    Current Outpatient Medications  Medication Sig Dispense Refill   ALPRAZolam (XANAX) 1 MG tablet Take 1 mg by mouth 2 (two) times daily.      Ascorbic Acid (VITAMIN C) 1000 MG tablet Take 1,000 mg by mouth daily.     aspirin 81 MG tablet Take 81 mg by mouth every evening.     atorvastatin (LIPITOR) 40 MG tablet Take 1 tablet (  40 mg total) by mouth daily. (Patient not taking: Reported on 07/01/2021) 90 tablet 4   Calcium Carb-Cholecalciferol (CALCIUM-VITAMIN D) 600-400 MG-UNIT TABS Take 1 tablet by mouth daily.     Cyanocobalamin (VITAMIN B 12) 500 MCG TABS Take 500 mcg by mouth daily.     fluticasone (FLONASE) 50 MCG/ACT nasal spray Place 2 sprays into the nose daily as needed for allergies.      levothyroxine (SYNTHROID, LEVOTHROID) 112 MCG tablet Take 112 mcg by mouth See admin instructions. Take 112 mcg by mouth daily on Sun, Tues, Wed, Thurs, Sat     levothyroxine (SYNTHROID, LEVOTHROID) 125 MCG tablet Take 125 mcg by mouth See admin instructions. Take 125 mcg by mouth on Monday and Friday     magnesium oxide (MAG-OX) 400 MG tablet Take 400 mg by mouth daily.     metoprolol tartrate  (LOPRESSOR) 25 MG tablet Take 12.5 mg by mouth 2 (two) times daily.     Multiple Vitamin (MULTIVITAMIN WITH MINERALS) TABS tablet Take 1 tablet by mouth daily.     omeprazole (PRILOSEC) 20 MG capsule Take 20 mg by mouth daily.      oxyCODONE-acetaminophen (PERCOCET/ROXICET) 5-325 MG tablet Take 1 tablet by mouth every 4 (four) hours as needed for severe pain.     pravastatin (PRAVACHOL) 40 MG tablet Take 40 mg by mouth daily.     sertraline (ZOLOFT) 100 MG tablet Take 100 mg by mouth daily.     traMADol (ULTRAM) 50 MG tablet Take 1 tablet (50 mg total) by mouth every 6 (six) hours as needed for moderate pain. 20 tablet 0   warfarin (COUMADIN) 5 MG tablet Take 5 mg daily except on Mondays: take 2.5 mg (1/2 tablet) (Patient taking differently: Take 1.5 tablets (7.5 mg) on ( Mon. Fri. & Sun) & Take 1 tablet (5 mg) on Tues. Thurs. & Sat.) 90 tablet 3   No current facility-administered medications for this visit.     ROS:  See HPI  Physical Exam:  Today's Vitals   07/15/21 1252  BP: 135/72  Pulse: 67  Resp: 20  Temp: 97.7 F (36.5 C)  TempSrc: Temporal  SpO2: 98%  Weight: 177 lb 12.8 oz (80.6 kg)  Height: 5\' 5"  (1.651 m)  PainSc: 5   PainLoc: Leg   Body mass index is 29.59 kg/m.   Incision:  well healed Neuro: in tact; moving all extremities equally Extremities:   easily palpable DP pulses bilaterally; bilateral calves and thighs are soft  Carotid duplex 07/15/2021: Right:  1-39% Left:  1-39% Vertebrals:  Bilateral vertebral arteries demonstrate antegrade flow.  Subclavians: Normal flow hemodynamics were seen in bilateral subclavian arteries.     Assessment/Plan:  This is a 75 y.o. female who is s/p: right CEA on 04/23/2021 for asymptomatic carotid artery stenosis by Dr. Donzetta Matters and had an unremarkable post op course.    Carotid stenosis -pt's carotid duplex today reveals 1-39% bilateral ICA stenosis and denies stroke sx.   -her dysphagia and hoarseness she contributes to  her need for an esophageal dilatation.  -given she had a duplex today, will have her return in September for repeat study -continue asa/statin -she knows to call sooner if she has any issues before then.   Bilateral leg pain -pt on chronic coumadin and legs are not swollen and are soft and her pain is not everyday therefore I have very low suspicion for DVT.  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  Discussed with  her that if she continues to have issues, we can repeat her venous duplex.  She also has palpable pedal pulses.     Leontine Locket, [facility] [phone]   Clinic MD:  Donzetta Matters

## 2021-07-16 ENCOUNTER — Other Ambulatory Visit: Payer: Self-pay

## 2021-07-16 DIAGNOSIS — I6529 Occlusion and stenosis of unspecified carotid artery: Secondary | ICD-10-CM

## 2021-07-16 NOTE — Telephone Encounter (Signed)
Notified Dr. Perley Jain office, [facility], that coumadin is not managed by [facility] and that they will need to reach out to patient's provider at [facility].

## 2021-07-16 NOTE — Telephone Encounter (Signed)
Patient with diagnosis of antiphospholipid syndrome on warfarin for anticoagulation.    Procedure: colonoscopy Date of procedure: 10/15/21  CrCl 51 (with adjusted body weight) Platelet count 215  Per office protocol, patient can hold warfarin for 5 days prior to procedure.   Patient will need bridging with Lovenox (enoxaparin) around procedure.  Patient has INR followed by [facility].  Please reach out to them for coordination of bridging.

## 2021-08-11 ENCOUNTER — Telehealth: Payer: Self-pay | Admitting: Gastroenterology

## 2021-08-11 NOTE — Telephone Encounter (Signed)
Good afternoon Dr. Ardis Hughs,  This patient has requested a transfer of care to you.  You saw her sister (who is now deceased) and she was very impressed with you.  She is extremely dissatisfied with the treatment she has received from [facility].  She is having problems with her throat (feels it needs to be stretched) and wants to begin her treatment here.  Please let me know if you approve of her transfer of care.  Her records from [facility] are in [place] for your review.  Thank you.

## 2021-08-12 ENCOUNTER — Encounter: Payer: Self-pay | Admitting: Gastroenterology

## 2021-08-12 NOTE — Telephone Encounter (Signed)
Good morning Dr. Ardis Hughs,  I actually did print out the records, so I will send them to you interoffice.  Thank you.

## 2021-08-18 ENCOUNTER — Ambulatory Visit: Payer: Self-pay | Admitting: Surgery

## 2021-08-18 DIAGNOSIS — N6311 Unspecified lump in the right breast, upper outer quadrant: Secondary | ICD-10-CM

## 2021-09-07 ENCOUNTER — Other Ambulatory Visit: Payer: Self-pay

## 2021-09-07 ENCOUNTER — Encounter (HOSPITAL_BASED_OUTPATIENT_CLINIC_OR_DEPARTMENT_OTHER): Payer: Self-pay | Admitting: Surgery

## 2021-09-14 ENCOUNTER — Other Ambulatory Visit (HOSPITAL_BASED_OUTPATIENT_CLINIC_OR_DEPARTMENT_OTHER): Payer: Medicare HMO

## 2021-09-14 ENCOUNTER — Encounter (HOSPITAL_BASED_OUTPATIENT_CLINIC_OR_DEPARTMENT_OTHER)
Admission: RE | Admit: 2021-09-14 | Discharge: 2021-09-14 | Disposition: A | Discharge status: Home / Self Care | Payer: Medicare HMO | Source: Ambulatory Visit | Attending: Surgery | Admitting: Surgery

## 2021-09-14 DIAGNOSIS — N6311 Unspecified lump in the right breast, upper outer quadrant: Secondary | ICD-10-CM | POA: Diagnosis present

## 2021-09-14 DIAGNOSIS — Z01818 Encounter for other preprocedural examination: Secondary | ICD-10-CM | POA: Diagnosis present

## 2021-09-14 DIAGNOSIS — I10 Essential (primary) hypertension: Secondary | ICD-10-CM | POA: Diagnosis not present

## 2021-09-14 DIAGNOSIS — E039 Hypothyroidism, unspecified: Secondary | ICD-10-CM | POA: Diagnosis not present

## 2021-09-14 DIAGNOSIS — F418 Other specified anxiety disorders: Secondary | ICD-10-CM | POA: Diagnosis not present

## 2021-09-14 DIAGNOSIS — Z923 Personal history of irradiation: Secondary | ICD-10-CM | POA: Diagnosis not present

## 2021-09-14 DIAGNOSIS — Z86718 Personal history of other venous thrombosis and embolism: Secondary | ICD-10-CM | POA: Diagnosis not present

## 2021-09-14 DIAGNOSIS — Z7901 Long term (current) use of anticoagulants: Secondary | ICD-10-CM | POA: Diagnosis not present

## 2021-09-14 DIAGNOSIS — M329 Systemic lupus erythematosus, unspecified: Secondary | ICD-10-CM | POA: Diagnosis not present

## 2021-09-14 LAB — CBC WITH DIFFERENTIAL/PLATELET
Abs Immature Granulocytes: 0.03 K/uL (ref 0.00–0.07)
Basophils Absolute: 0.1 K/uL (ref 0.0–0.1)
Basophils Relative: 1 %
Eosinophils Absolute: 0.3 K/uL (ref 0.0–0.5)
Eosinophils Relative: 4 %
HCT: 37.2 % (ref 36.0–46.0)
Hemoglobin: 12 g/dL (ref 12.0–15.0)
Immature Granulocytes: 0 %
Lymphocytes Relative: 32 %
Lymphs Abs: 2.3 K/uL (ref 0.7–4.0)
MCH: 29.2 pg (ref 26.0–34.0)
MCHC: 32.3 g/dL (ref 30.0–36.0)
MCV: 90.5 fL (ref 80.0–100.0)
Monocytes Absolute: 0.6 K/uL (ref 0.1–1.0)
Monocytes Relative: 9 %
Neutro Abs: 3.9 K/uL (ref 1.7–7.7)
Neutrophils Relative %: 54 %
Platelets: 211 K/uL (ref 150–400)
RBC: 4.11 MIL/uL (ref 3.87–5.11)
RDW: 14.5 % (ref 11.5–15.5)
WBC: 7.3 K/uL (ref 4.0–10.5)
nRBC: 0 % (ref 0.0–0.2)

## 2021-09-14 LAB — COMPREHENSIVE METABOLIC PANEL WITH GFR
ALT: 20 U/L (ref 0–44)
AST: 25 U/L (ref 15–41)
Albumin: 3.8 g/dL (ref 3.5–5.0)
Alkaline Phosphatase: 73 U/L (ref 38–126)
Anion gap: 8 (ref 5–15)
BUN: 24 mg/dL — ABNORMAL HIGH (ref 8–23)
CO2: 25 mmol/L (ref 22–32)
Calcium: 8.7 mg/dL — ABNORMAL LOW (ref 8.9–10.3)
Chloride: 102 mmol/L (ref 98–111)
Creatinine, Ser: 1.26 mg/dL — ABNORMAL HIGH (ref 0.44–1.00)
GFR, Estimated: 45 mL/min — ABNORMAL LOW
Glucose, Bld: 109 mg/dL — ABNORMAL HIGH (ref 70–99)
Potassium: 4.7 mmol/L (ref 3.5–5.1)
Sodium: 135 mmol/L (ref 135–145)
Total Bilirubin: 0.7 mg/dL (ref 0.3–1.2)
Total Protein: 6.9 g/dL (ref 6.5–8.1)

## 2021-09-14 NOTE — Progress Notes (Signed)
° ° ° ° °  Enhanced Recovery after Surgery for Orthopedics Enhanced Recovery after Surgery is a protocol used to improve the stress on your body and your recovery after surgery.  Patient Instructions  The night before surgery:  No food after midnight. ONLY clear liquids after midnight  The day of surgery (if you do NOT have diabetes):  Drink ONE (1) Pre-Surgery Clear Ensure as directed.   This drink was given to you during your hospital  pre-op appointment visit. The pre-op nurse will instruct you on the time to drink the  Pre-Surgery Ensure depending on your surgery time. Finish the drink at the designated time by the pre-op nurse.  Nothing else to drink after completing the  Pre-Surgery Clear Ensure.  The day of surgery (if you have diabetes): Drink ONE (1) Gatorade 2 (G2) as directed. This drink was given to you during your hospital  pre-op appointment visit.  The pre-op nurse will instruct you on the time to drink the   Gatorade 2 (G2) depending on your surgery time. Color of the Gatorade may vary. Red is not allowed. Nothing else to drink after completing the  Gatorade 2 (G2).         If you have questions, please contact your surgeons office.  Patient received surgery soap with instructions. Patient verbalizes understanding.

## 2021-09-15 ENCOUNTER — Other Ambulatory Visit: Payer: Self-pay

## 2021-09-15 ENCOUNTER — Ambulatory Visit (HOSPITAL_BASED_OUTPATIENT_CLINIC_OR_DEPARTMENT_OTHER): Payer: Medicare HMO | Admitting: Certified Registered"

## 2021-09-15 ENCOUNTER — Encounter (HOSPITAL_BASED_OUTPATIENT_CLINIC_OR_DEPARTMENT_OTHER): Payer: Self-pay | Admitting: Surgery

## 2021-09-15 ENCOUNTER — Ambulatory Visit (HOSPITAL_BASED_OUTPATIENT_CLINIC_OR_DEPARTMENT_OTHER)
Admission: RE | Admit: 2021-09-15 | Discharge: 2021-09-15 | Disposition: A | Discharge status: Home / Self Care | Payer: Medicare HMO | Attending: Surgery | Admitting: Surgery

## 2021-09-15 ENCOUNTER — Encounter (HOSPITAL_BASED_OUTPATIENT_CLINIC_OR_DEPARTMENT_OTHER)
Admission: RE | Disposition: A | Discharge status: Home / Self Care | Payer: Self-pay | Source: Home / Self Care | Attending: Surgery

## 2021-09-15 DIAGNOSIS — E039 Hypothyroidism, unspecified: Secondary | ICD-10-CM | POA: Insufficient documentation

## 2021-09-15 DIAGNOSIS — N6310 Unspecified lump in the right breast, unspecified quadrant: Secondary | ICD-10-CM | POA: Diagnosis not present

## 2021-09-15 DIAGNOSIS — I10 Essential (primary) hypertension: Secondary | ICD-10-CM | POA: Insufficient documentation

## 2021-09-15 DIAGNOSIS — Z86718 Personal history of other venous thrombosis and embolism: Secondary | ICD-10-CM | POA: Insufficient documentation

## 2021-09-15 DIAGNOSIS — I739 Peripheral vascular disease, unspecified: Secondary | ICD-10-CM | POA: Diagnosis not present

## 2021-09-15 DIAGNOSIS — Z923 Personal history of irradiation: Secondary | ICD-10-CM | POA: Insufficient documentation

## 2021-09-15 DIAGNOSIS — N6311 Unspecified lump in the right breast, upper outer quadrant: Secondary | ICD-10-CM | POA: Insufficient documentation

## 2021-09-15 DIAGNOSIS — F418 Other specified anxiety disorders: Secondary | ICD-10-CM | POA: Insufficient documentation

## 2021-09-15 DIAGNOSIS — Z7901 Long term (current) use of anticoagulants: Secondary | ICD-10-CM | POA: Insufficient documentation

## 2021-09-15 DIAGNOSIS — M329 Systemic lupus erythematosus, unspecified: Secondary | ICD-10-CM | POA: Insufficient documentation

## 2021-09-15 HISTORY — PX: BREAST LUMPECTOMY: SHX2

## 2021-09-15 LAB — PROTIME-INR
INR: 1 (ref 0.8–1.2)
Prothrombin Time: 13.7 seconds (ref 11.4–15.2)

## 2021-09-15 SURGERY — BREAST LUMPECTOMY
Anesthesia: General | Site: Breast | Laterality: Right

## 2021-09-15 MED ORDER — ONDANSETRON HCL 4 MG/2ML IJ SOLN
INTRAMUSCULAR | Status: AC
  Filled 2021-09-15: qty 2

## 2021-09-15 MED ORDER — CHLORHEXIDINE GLUCONATE CLOTH 2 % EX PADS: 6.0000 | MEDICATED_PAD | Freq: Once | CUTANEOUS | Status: DC

## 2021-09-15 MED ORDER — PROMETHAZINE HCL 25 MG/ML IJ SOLN: 6.2500 mg | INTRAMUSCULAR | Status: DC | PRN

## 2021-09-15 MED ORDER — OXYCODONE HCL 5 MG/5ML PO SOLN: 5.0000 mg | Freq: Once | ORAL | Status: DC | PRN

## 2021-09-15 MED ORDER — SODIUM CHLORIDE 0.9 % IV SOLN
INTRAVENOUS | Status: AC
  Filled 2021-09-15: qty 10

## 2021-09-15 MED ORDER — MIDAZOLAM HCL 2 MG/2ML IJ SOLN
INTRAMUSCULAR | Status: AC
  Filled 2021-09-15: qty 2

## 2021-09-15 MED ORDER — BUPIVACAINE-EPINEPHRINE (PF) 0.25% -1:200000 IJ SOLN
INTRAMUSCULAR | Status: AC
  Filled 2021-09-15: qty 30

## 2021-09-15 MED ORDER — DEXAMETHASONE SODIUM PHOSPHATE 10 MG/ML IJ SOLN
INTRAMUSCULAR | Status: AC
  Filled 2021-09-15: qty 1

## 2021-09-15 MED ORDER — MIDAZOLAM HCL 2 MG/2ML IJ SOLN
INTRAMUSCULAR | Status: DC | PRN
Start: 2021-09-15 — End: 2021-09-15
  Administered 2021-09-15: 2 mg via INTRAVENOUS

## 2021-09-15 MED ORDER — CLINDAMYCIN PHOSPHATE 900 MG/50ML IV SOLN
INTRAVENOUS | Status: DC | PRN
  Administered 2021-09-15: 900 mg via INTRAVENOUS

## 2021-09-15 MED ORDER — DEXAMETHASONE SODIUM PHOSPHATE 10 MG/ML IJ SOLN
INTRAMUSCULAR | Status: DC | PRN
  Administered 2021-09-15: 10 mg via INTRAVENOUS

## 2021-09-15 MED ORDER — VANCOMYCIN HCL 500 MG IV SOLR
INTRAVENOUS | Status: AC
  Filled 2021-09-15: qty 10

## 2021-09-15 MED ORDER — BUPIVACAINE-EPINEPHRINE 0.25% -1:200000 IJ SOLN
INTRAMUSCULAR | Status: DC | PRN
  Administered 2021-09-15: 20 mL

## 2021-09-15 MED ORDER — CLINDAMYCIN PHOSPHATE 900 MG/50ML IV SOLN: 900.0000 mg | INTRAVENOUS | Status: DC

## 2021-09-15 MED ORDER — EPHEDRINE SULFATE (PRESSORS) 50 MG/ML IJ SOLN
INTRAMUSCULAR | Status: DC | PRN
  Administered 2021-09-15: 15 mg via INTRAVENOUS

## 2021-09-15 MED ORDER — LACTATED RINGERS IV SOLN: INTRAVENOUS | Status: DC

## 2021-09-15 MED ORDER — SODIUM CHLORIDE 0.9 % IV SOLN
INTRAVENOUS | Status: DC | PRN
  Administered 2021-09-15: 100 mL

## 2021-09-15 MED ORDER — LACTATED RINGERS IV SOLN: INTRAVENOUS | Status: DC | PRN

## 2021-09-15 MED ORDER — OXYCODONE HCL 5 MG PO TABS: 5.0000 mg | ORAL_TABLET | Freq: Once | ORAL | Status: DC | PRN

## 2021-09-15 MED ORDER — PROPOFOL 10 MG/ML IV BOLUS
INTRAVENOUS | Status: DC | PRN
  Administered 2021-09-15: 200 mg via INTRAVENOUS

## 2021-09-15 MED ORDER — AMISULPRIDE (ANTIEMETIC) 5 MG/2ML IV SOLN: 10.0000 mg | Freq: Once | INTRAVENOUS | Status: DC | PRN

## 2021-09-15 MED ORDER — LIDOCAINE HCL (CARDIAC) PF 100 MG/5ML IV SOSY
PREFILLED_SYRINGE | INTRAVENOUS | Status: DC | PRN
  Administered 2021-09-15: 60 mg via INTRAVENOUS

## 2021-09-15 MED ORDER — ONDANSETRON HCL 4 MG/2ML IJ SOLN
INTRAMUSCULAR | Status: DC | PRN
  Administered 2021-09-15: 4 mg via INTRAVENOUS

## 2021-09-15 MED ORDER — HYDROMORPHONE HCL 1 MG/ML IJ SOLN: 0.2500 mg | INTRAMUSCULAR | Status: DC | PRN

## 2021-09-15 MED ORDER — HYDROCODONE-ACETAMINOPHEN 5-325 MG PO TABS: 1.0000 | ORAL_TABLET | Freq: Four times a day (QID) | ORAL | 0 refills | Status: AC | PRN

## 2021-09-15 MED ORDER — FENTANYL CITRATE (PF) 100 MCG/2ML IJ SOLN
INTRAMUSCULAR | Status: DC | PRN
  Administered 2021-09-15: 100 ug via INTRAVENOUS

## 2021-09-15 MED ORDER — PROPOFOL 10 MG/ML IV BOLUS
INTRAVENOUS | Status: AC
Start: 2021-09-15 — End: ?
  Filled 2021-09-15: qty 20

## 2021-09-15 MED ORDER — FENTANYL CITRATE (PF) 100 MCG/2ML IJ SOLN
INTRAMUSCULAR | Status: AC
  Filled 2021-09-15: qty 2

## 2021-09-15 MED ORDER — LIDOCAINE 2% (20 MG/ML) 5 ML SYRINGE
INTRAMUSCULAR | Status: AC
  Filled 2021-09-15: qty 5

## 2021-09-15 MED ORDER — PROPOFOL 10 MG/ML IV BOLUS
INTRAVENOUS | Status: AC
  Filled 2021-09-15: qty 20

## 2021-09-15 MED ORDER — CLINDAMYCIN PHOSPHATE 900 MG/50ML IV SOLN
INTRAVENOUS | Status: AC
  Filled 2021-09-15: qty 50

## 2021-09-15 SURGICAL SUPPLY — 50 items
ADH SKN CLS APL DERMABOND .7 (GAUZE/BANDAGES/DRESSINGS) ×1
APL PRP STRL LF DISP 70% ISPRP (MISCELLANEOUS) ×1
APPLIER CLIP 9.375 MED OPEN (MISCELLANEOUS)
APR CLP MED 9.3 20 MLT OPN (MISCELLANEOUS)
BINDER BREAST LRG (GAUZE/BANDAGES/DRESSINGS) IMPLANT
BINDER BREAST MEDIUM (GAUZE/BANDAGES/DRESSINGS) IMPLANT
BINDER BREAST XLRG (GAUZE/BANDAGES/DRESSINGS) ×1 IMPLANT
BINDER BREAST XXLRG (GAUZE/BANDAGES/DRESSINGS) IMPLANT
BLADE SURG 15 STRL LF DISP TIS (BLADE) ×2 IMPLANT
BLADE SURG 15 STRL SS (BLADE) ×2
CANISTER SUCT 1200ML W/VALVE (MISCELLANEOUS) ×3 IMPLANT
CHLORAPREP W/TINT 26 (MISCELLANEOUS) ×3 IMPLANT
CLIP APPLIE 9.375 MED OPEN (MISCELLANEOUS) IMPLANT
COVER BACK TABLE 60X90IN (DRAPES) ×3 IMPLANT
COVER MAYO STAND STRL (DRAPES) ×3 IMPLANT
DERMABOND ADVANCED (GAUZE/BANDAGES/DRESSINGS) ×1
DERMABOND ADVANCED .7 DNX12 (GAUZE/BANDAGES/DRESSINGS) ×2 IMPLANT
DRAPE LAPAROSCOPIC ABDOMINAL (DRAPES) IMPLANT
DRAPE LAPAROTOMY 100X72 PEDS (DRAPES) ×3 IMPLANT
DRAPE UTILITY XL STRL (DRAPES) ×3 IMPLANT
ELECT COATED BLADE 2.86 ST (ELECTRODE) ×3 IMPLANT
ELECT REM PT RETURN 9FT ADLT (ELECTROSURGICAL) ×2
ELECTRODE REM PT RTRN 9FT ADLT (ELECTROSURGICAL) ×2 IMPLANT
GLOVE SRG 8 PF TXTR STRL LF DI (GLOVE) ×2 IMPLANT
GLOVE SURG LTX SZ8 (GLOVE) ×3 IMPLANT
GLOVE SURG UNDER POLY LF SZ8 (GLOVE) ×2
GOWN STRL REUS W/ TWL LRG LVL3 (GOWN DISPOSABLE) ×4 IMPLANT
GOWN STRL REUS W/ TWL XL LVL3 (GOWN DISPOSABLE) ×2 IMPLANT
GOWN STRL REUS W/TWL LRG LVL3 (GOWN DISPOSABLE) ×4
GOWN STRL REUS W/TWL XL LVL3 (GOWN DISPOSABLE) ×2
HEMOSTAT ARISTA ABSORB 3G PWDR (HEMOSTASIS) ×1 IMPLANT
KIT MARKER MARGIN INK (KITS) ×1 IMPLANT
NDL HYPO 25X1 1.5 SAFETY (NEEDLE) ×2 IMPLANT
NEEDLE HYPO 25X1 1.5 SAFETY (NEEDLE) ×2 IMPLANT
NS IRRIG 1000ML POUR BTL (IV SOLUTION) ×3 IMPLANT
PACK BASIN DAY SURGERY FS (CUSTOM PROCEDURE TRAY) ×3 IMPLANT
PENCIL SMOKE EVACUATOR (MISCELLANEOUS) ×3 IMPLANT
SLEEVE SCD COMPRESS KNEE MED (STOCKING) ×3 IMPLANT
SPIKE FLUID TRANSFER (MISCELLANEOUS) ×2 IMPLANT
SPONGE T-LAP 4X18 ~~LOC~~+RFID (SPONGE) ×3 IMPLANT
STAPLER VISISTAT 35W (STAPLE) IMPLANT
SUT MON AB 4-0 PC3 18 (SUTURE) ×3 IMPLANT
SUT SILK 2 0 SH (SUTURE) ×1 IMPLANT
SUT VICRYL 3-0 CR8 SH (SUTURE) ×3 IMPLANT
SYR BULB EAR ULCER 2OZ BL STRL (SYRINGE) ×1 IMPLANT
SYR CONTROL 10ML LL (SYRINGE) ×3 IMPLANT
TOWEL GREEN STERILE FF (TOWEL DISPOSABLE) ×6 IMPLANT
TRAY FAXITRON CT DISP (TRAY / TRAY PROCEDURE) IMPLANT
TUBE CONNECTING 20X1/4 (TUBING) ×3 IMPLANT
YANKAUER SUCT BULB TIP NO VENT (SUCTIONS) ×3 IMPLANT

## 2021-09-15 NOTE — Anesthesia Procedure Notes (Signed)
Procedure Name: LMA Insertion Date/Time: 09/15/2021 11:45 AM Performed by: Verita Lamb, CRNA Pre-anesthesia Checklist: Patient identified, Emergency Drugs available, Suction available and Patient being monitored Patient Re-evaluated:Patient Re-evaluated prior to induction Oxygen Delivery Method: Circle system utilized Preoxygenation: Pre-oxygenation with 100% oxygen Induction Type: IV induction Ventilation: Mask ventilation without difficulty LMA: LMA inserted LMA Size: 4.0 Number of attempts: 1 Airway Equipment and Method: Bite block Placement Confirmation: positive ETCO2, CO2 detector and breath sounds checked- equal and bilateral Tube secured with: Tape Dental Injury: Teeth and Oropharynx as per pre-operative assessment

## 2021-09-15 NOTE — Transfer of Care (Signed)
Immediate Anesthesia Transfer of Care Note  Patient: [name]  Procedure(s) Performed: RIGHT BREAST LUMPECTOMY (Right: Breast)  Patient Location: PACU  Anesthesia Type:General  Level of Consciousness: awake, alert  and patient cooperative  Airway & Oxygen Therapy: Patient Spontanous Breathing and Patient connected to face mask oxygen  Post-op Assessment: Report given to RN and Post -op Vital signs reviewed and stable  Post vital signs: Reviewed and stable  Last Vitals:  Vitals Value Taken Time  BP 111/71 09/15/21 1217  Temp    Pulse 69 09/15/21 1219  Resp 13 09/15/21 1219  SpO2 98 % 09/15/21 1219  Vitals shown include unvalidated device data.  Last Pain:  Vitals:   09/15/21 1000  TempSrc: Oral  PainSc: 7          Complications: No notable events documented.

## 2021-09-15 NOTE — H&P (Signed)
History of Present Illness: [name] is a 76 y.o. female who is seen today as an office consultation at the request of Dr. Deatra Ina for evaluation of New breast .   Patient presents for evaluation of painful right breast mass. She has a history of right breast lumpectomy and radiation therapy over 20 years ago. She received 6 weeks of postoperative radiation therapy. She had a painful mass in her right breast for a number of years. On recent mammogram this area showed significant dystrophic calcifications. It is gotten quite painful and made it difficult for her to sleep at night. She is here today to discuss it. Its about 3 cm in maximal diameter. There is been no redness or drainage. No biopsy was done since no suspicious lesions are noted in either breast.  Review of Systems: A complete review of systems was obtained from the patient. I have reviewed this information and discussed as appropriate with the patient. See HPI as well for other ROS.    Medical History: Past Medical History:  Diagnosis Date   Anxiety   Arthritis   DVT (deep venous thrombosis) (CMS-HCC)   GERD (gastroesophageal reflux disease)   History of cancer   Sleep apnea   Thyroid disease   There is no problem list on file for this patient.  Past Surgical History:  Procedure Laterality Date   coranted atery 04/23/2021    Allergies  Allergen Reactions   Hornet Venom Anaphylaxis and Other (See Comments)  Anaphylaxis Shock SHOCK Anaphylaxis Shock   Metoclopramide Other (See Comments) and Shortness Of Breath  Tongue got numb; didn't feel good   Phytonadione (Vitamin K1) Anaphylaxis, Other (See Comments) and Rash  "kills me" iv   Venom-Honey Bee Anaphylaxis   Baclofen Other (See Comments)  Dizziness   Cephalexin Other (See Comments)   Hydrocodone-Acetaminophen Other (See Comments)   Iodinated Contrast Media Other (See Comments)   Ciprofloxacin Rash   Diphenhydramine Hcl Rash  Rash   Doxycycline  Other (See Comments) and Rash  Tongue burning, thrush   Duloxetine Other (See Comments) and Swelling  Does not sleep and cramps Swelling of tongue and thrush. Does not sleep and cramps Swelling of tongue and thrush. Does not sleep and cramps   Oxaprozin Rash and Other (See Comments)  Rash   Rosuvastatin Other (See Comments) and Rash  Muscle aches Muscle Aches Muscle aches Muscle Aches Muscle Aches   Sulfa (Sulfonamide Antibiotics) Other (See Comments) and Rash   Current Outpatient Medications on File Prior to Visit  Medication Sig Dispense Refill   fluticasone propionate (FLONASE ALLERGY RELIEF) 50 mcg/actuation nasal spray 1 spray in each nostril   levothyroxine (SYNTHROID) 125 MCG tablet 1 tablet   sertraline (ZOLOFT) 100 MG tablet Take 1 tablet by mouth once daily   aspirin 81 MG chewable tablet Take by mouth   meclizine (ANTIVERT) 25 mg tablet 1 tablet   metoprolol tartrate (LOPRESSOR) 25 MG tablet Take 12.5 mg by mouth 2 (two) times daily   omeprazole (PRILOSEC) 20 MG DR capsule Take 20 mg by mouth once daily   oxyCODONE-acetaminophen (PERCOCET) 5-325 mg tablet Take by mouth   pravastatin (PRAVACHOL) 40 MG tablet 1 tablet   pseudoephedrine (SUDAFED) 30 mg tablet 120mg  tablet   rOPINIRole (REQUIP) 1 MG tablet 1 tablet   warfarin (COUMADIN) 5 MG tablet as directed   No current facility-administered medications on file prior to visit.   Family History  Problem Relation Age of Onset   Coronary Artery Disease (Blocked  arteries around heart) Mother   Hyperlipidemia (Elevated cholesterol) Mother   High blood pressure (Hypertension) Mother   Skin cancer Mother   Colon cancer Sister   Coronary Artery Disease (Blocked arteries around heart) Sister   Hyperlipidemia (Elevated cholesterol) Sister   High blood pressure (Hypertension) Sister   Coronary Artery Disease (Blocked arteries around heart) Brother   Hyperlipidemia (Elevated cholesterol) Brother   High blood pressure  (Hypertension) Brother    Social History   Tobacco Use  Smoking Status Never  Smokeless Tobacco Never    Social History   Socioeconomic History   Marital status: Unknown  Tobacco Use   Smoking status: Never   Smokeless tobacco: Never  Substance and Sexual Activity   Alcohol use: Never   Drug use: Never   Objective:   Vitals:  08/18/21 0930  BP: 120/80  Pulse: 70  Temp: 36.7 C (98 F)  SpO2: 97%  Weight: 79.7 kg (175 lb 12.8 oz)  Height: 165.1 cm (5\' 5" )   Body mass index is 29.25 kg/m.  Physical Exam Constitutional:  Appearance: Normal appearance.  Eyes:  Pupils: Pupils are equal, round, and reactive to light.  Cardiovascular:  Rate and Rhythm: Normal rate.  Pulmonary:  Effort: Pulmonary effort is normal.  Breath sounds: No stridor.  Chest:  Breasts: Left: Normal.   Musculoskeletal:  Cervical back: Normal range of motion.  Lymphadenopathy:  Upper Body:  Right upper body: No supraclavicular or axillary adenopathy.  Left upper body: No supraclavicular or axillary adenopathy.  Neurological:  General: No focal deficit present.  Mental Status: She is alert and oriented to person, place, and time.  Psychiatric:  Mood and Affect: Mood normal.  Behavior: Behavior normal.     Labs, Imaging and Diagnostic Testing: Mammogram shows a right breast upper outer quadrant with significant dystrophic jagged calcifications involving previous lumpectomy bed measuring about 4 cm in maximal diameter.  Assessment and Plan:   Diagnoses and all orders for this visit:  Mass of upper outer quadrant of right breast    Patient desires excision of the painful right breast mass. On mammogram it is a large area of dystrophic calcification secondary to trauma and radiation therapy. It is quite painful and hard therefore she desires excision for pain relief. Discussed that this may not be 100% cure of her pain but certainly will help I think. Risks and benefits of surgery  discussed. Complications of surgery discussed. 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 Alternatives to surgery have been discussed with the patient. Risks of surgery include bleeding, Infection, Seroma formation, death, and the need for further surgery. The patient understands and wishes to proceed.  No follow-ups on file.  Kennieth Francois, MD

## 2021-09-15 NOTE — Anesthesia Preprocedure Evaluation (Signed)
Anesthesia Evaluation  Patient identified by MRN, date of birth, ID band Patient awake    Reviewed: Allergy & Precautions, NPO status , Patient's Chart, lab work & pertinent test results  Airway Mallampati: II  TM Distance: >3 FB Neck ROM: Full    Dental  (+) Dental Advisory Given, Partial Lower, Partial Upper   Pulmonary asthma , sleep apnea ,    breath sounds clear to auscultation       Cardiovascular hypertension, Pt. on medications + Peripheral Vascular Disease   Rhythm:Regular Rate:Normal  Echo: 1. Left ventricular ejection fraction, by estimation, is 60 to 65%. The  left ventricle has normal function. The left ventricle has no regional  wall motion abnormalities. Left ventricular diastolic parameters are  consistent with Grade I diastolic  dysfunction (impaired relaxation). The average left ventricular global  longitudinal strain is -22.2 %. The global longitudinal strain is normal.  2. Right ventricular systolic function is normal. The right ventricular  size is normal. There is normal pulmonary artery systolic pressure.  3. The mitral valve is normal in structure. Trivial mitral valve  regurgitation. No evidence of mitral stenosis.  4. The aortic valve is tricuspid. There is mild calcification of the  aortic valve. There is mild thickening of the aortic valve. Aortic valve  regurgitation is mild. No aortic stenosis is present. Aortic regurgitation  PHT measures 965 msec.  5. The inferior vena cava is normal in size with greater than 50%  respiratory variability, suggesting right atrial pressure of 3 mmHg.    Neuro/Psych  Headaches, PSYCHIATRIC DISORDERS Anxiety Depression  Neuromuscular disease CVA    GI/Hepatic hiatal hernia, GERD  ,(+) Hepatitis -  Endo/Other  Hypothyroidism   Renal/GU negative Renal ROS     Musculoskeletal  (+) Arthritis , Osteoarthritis,  Fibromyalgia -  Abdominal (+) + obese,    Peds  Hematology   Anesthesia Other Findings   Reproductive/Obstetrics                             Anesthesia Physical  Anesthesia Plan  ASA: 3  Anesthesia Plan: General   Post-op Pain Management:    Induction: Intravenous  PONV Risk Score and Plan: 3 and Ondansetron, Dexamethasone, Treatment may vary due to age or medical condition and Midazolam  Airway Management Planned: LMA  Additional Equipment:   Intra-op Plan:   Post-operative Plan: Extubation in OR  Informed Consent: I have reviewed the patients History and Physical, chart, labs and discussed the procedure including the risks, benefits and alternatives for the proposed anesthesia with the patient or authorized representative who has indicated his/her understanding and acceptance.     Dental advisory given  Plan Discussed with: CRNA  Anesthesia Plan Comments: (See PAT note written 04/21/2021 by Myra Gianotti, PA-C. Reported history of awareness under anesthesia > 35 years ago, RUE restrictions due to right breast cancer/LN dissection. )        Anesthesia Quick Evaluation

## 2021-09-15 NOTE — Discharge Instructions (Addendum)
[place] Office Phone Number [phone]  BREAST BIOPSY/ PARTIAL MASTECTOMY: POST OP INSTRUCTIONS  Always review your discharge instruction sheet given to you by the facility where your surgery was performed.  IF YOU HAVE DISABILITY OR FAMILY LEAVE FORMS, YOU MUST BRING THEM TO THE OFFICE FOR PROCESSING.  DO NOT GIVE THEM TO YOUR DOCTOR.  A prescription for pain medication may be given to you upon discharge.  Take your pain medication as prescribed, if needed.  If narcotic pain medicine is not needed, then you may take acetaminophen (Tylenol) or ibuprofen (Advil) as needed. Take your usually prescribed medications unless otherwise directed If you need a refill on your pain medication, please contact your pharmacy.  They will contact our office to request authorization.  Prescriptions will not be filled after 5pm or on week-ends. You should eat very light the first 24 hours after surgery, such as soup, crackers, pudding, etc.  Resume your normal diet the day after surgery. Most patients will experience some swelling and bruising in the breast.  Ice packs and a good support bra will help.  Swelling and bruising can take several days to resolve.  It is common to experience some constipation if taking pain medication after surgery.  Increasing fluid intake and taking a stool softener will usually help or prevent this problem from occurring.  A mild laxative (Milk of Magnesia or Miralax) should be taken according to package directions if there are no bowel movements after 48 hours. Unless discharge instructions indicate otherwise, you may remove your bandages 24-48 hours after surgery, and you may shower at that time.  You may have steri-strips (small skin tapes) in place directly over the incision.  These strips should be left on the skin for 7-10 days.  If your surgeon used skin glue on the incision, you may shower in 24 hours.  The glue will flake off over the next 2-3 weeks.  Any  sutures or staples will be removed at the office during your follow-up visit. ACTIVITIES:  You may resume regular daily activities (gradually increasing) beginning the next day.  Wearing a good support bra or sports bra minimizes pain and swelling.  You may have sexual intercourse when it is comfortable. You may drive when you no longer are taking prescription pain medication, you can comfortably wear a seatbelt, and you can safely maneuver your car and apply brakes. RETURN TO WORK:  ______________________________________________________________________________________ Dennis Bast should see your doctor in the office for a follow-up appointment approximately two weeks after your surgery.  Your doctors nurse will typically make your follow-up appointment when she calls you with your pathology report.  Expect your pathology report 2-3 business days after your surgery.  You may call to check if you do not hear from [name] after three days. OTHER INSTRUCTIONS: _______________________________________________________________________________________________ _____________________________________________________________________________________________________________________________________ _____________________________________________________________________________________________________________________________________ _____________________________________________________________________________________________________________________________________  WHEN TO CALL YOUR DOCTOR: Fever over 101.0 Nausea and/or vomiting. Extreme swelling or bruising. Continued bleeding from incision. Increased pain, redness, or drainage from the incision.  The clinic staff is available to answer your questions during regular business hours.  Please dont hesitate to call and ask to speak to one of the nurses for clinical concerns.  If you have a medical emergency, go to the nearest emergency room or call 911.  A surgeon from [facility] is always on call at the hospital.  For further questions, please visit [email or web address]       Post Anesthesia Home Care Instructions  Activity: Get plenty of rest for  the remainder of the day. A responsible individual must stay with you for 24 hours following the procedure.  For the next 24 hours, DO NOT: -Drive a car -Paediatric nurse -Drink alcoholic beverages -Take any medication unless instructed by your physician -Make any legal decisions or sign important papers.  Meals: Start with liquid foods such as gelatin or soup. Progress to regular foods as tolerated. Avoid greasy, spicy, heavy foods. If nausea and/or vomiting occur, drink only clear liquids until the nausea and/or vomiting subsides. Call your physician if vomiting continues.  Special Instructions/Symptoms: Your throat may feel dry or sore from the anesthesia or the breathing tube placed in your throat during surgery. If this causes discomfort, gargle with warm salt water. The discomfort should disappear within 24 hours.  If you had a scopolamine patch placed behind your ear for the management of post- operative nausea and/or vomiting:  1. The medication in the patch is effective for 72 hours, after which it should be removed.  Wrap patch in a tissue and discard in the trash. Wash hands thoroughly with soap and water. 2. You may remove the patch earlier than 72 hours if you experience unpleasant side effects which may include dry mouth, dizziness or visual disturbances. 3. Avoid touching the patch. Wash your hands with soap and water after contact with the patch.        RESTART COUMADIN ON Thursday AT REGULAR DOSE

## 2021-09-15 NOTE — Op Note (Signed)
Preoperative diagnosis: Right breast mass  Postoperative diagnosis: Same  Procedure: Right breast lumpectomy  Surgeon: Erroll Luna, MD  Anesthesia: LMA with 0.25% Marcaine plain  EBL: Minimal  Specimen: Right breast tissue which appeared to be an area of calcifications, old surgical clips and fat necrosis  Drains: None  IV fluids: Per anesthesia record  Indications for procedure: The patient is a 76 year old female with a very painful right breast mass.  She had treatment for breast cancer over 20 years ago with radiation therapy, lumpectomy and chemotherapy.  She developed an area of fat necrosis.  On exam is a painful hard knot at her incision and the images showed dystrophic calcifications and findings consistent with fat necrosis.  There are some clips around it as well.  She desired excision due to increasing pain.  Risks and benefits of surgery discussed.  Risk of bleeding, infection, recurrence of pain or symptoms, cosmetic deformity, wound complications, drainage, and the need further treatments and/or procedures discussed.  Risk of bleeding, infection, death, DVT, pulmonary event, and other complications related to her underlying medical problems and use of anticoagulation discussed.  She agreed to proceed.  Description of procedure: The patient was met in the holding area and questions were answered.  Right breast was marked as correct site.  The procedure was reviewed.  She was then taken back to the operating.  She is placed upon upon the OR table.  After induction of general anesthesia, right breast was prepped and draped in a sterile fashion and timeout performed.  Proper patient, site and procedure verified.  The mass was easily visualized in the right upper breast.  After timeout was done local anesthetic was infiltrated around the mass.  An elliptical incision to take some of the scar down skin over the mass was done.  The mass was excised in its entirety.  This was cut back  to nice healthy looking fat tissue.  Of note there were multiple small surgical clips which mark the lumpectomy cavity look like.  This was extremely hard and resembled fat necrosis and microcalcifications.  The cavity is irrigated.  Local anesthetic was infiltrated.  Arista powder was placed.  The skin was mobilized to facilitate closure.  The deeper layers were closed with 3-0 Vicryl.  4 Monocryl was used to close the skin in a subcuticular fashion.  Dermabond was applied.  All counts found to be correct.  Breast binder placed.  The patient was awoke extubated taken to recovery in satisfactory condition.

## 2021-09-15 NOTE — Interval H&P Note (Signed)
History and Physical Interval Note:  09/15/2021 11:16 AM  [name]  has presented today for surgery, with the diagnosis of RIGHT BREAST MASS.  The various methods of treatment have been discussed with the patient and family. After consideration of risks, benefits and other options for treatment, the patient has consented to  Procedure(s): RIGHT BREAST LUMPECTOMY (Right) as a surgical intervention.  The patient's history has been reviewed, patient examined, no change in status, stable for surgery.  I have reviewed the patient's chart and labs.  Questions were answered to the patient's satisfaction.     Linton Hall

## 2021-09-15 NOTE — Anesthesia Postprocedure Evaluation (Signed)
Anesthesia Post Note  Patient: [name]  Procedure(s) Performed: RIGHT BREAST LUMPECTOMY (Right: Breast)     Patient location during evaluation: PACU Anesthesia Type: General Level of consciousness: awake and alert Pain management: pain level controlled Vital Signs Assessment: post-procedure vital signs reviewed and stable Respiratory status: spontaneous breathing, nonlabored ventilation and respiratory function stable Cardiovascular status: blood pressure returned to baseline and stable Postop Assessment: no apparent nausea or vomiting Anesthetic complications: no   No notable events documented.  Last Vitals:  Vitals:   09/15/21 1300 09/15/21 1314  BP: 126/77 114/67  Pulse: 66 66  Resp: 16 16  Temp:  36.6 C  SpO2: 97% 97%    Last Pain:  Vitals:   09/15/21 1314  TempSrc: Oral  PainSc: 0-No pain                 Lynda Rainwater

## 2021-09-16 ENCOUNTER — Encounter (HOSPITAL_BASED_OUTPATIENT_CLINIC_OR_DEPARTMENT_OTHER): Payer: Self-pay | Admitting: Surgery

## 2021-09-16 ENCOUNTER — Ambulatory Visit: Payer: Medicare HMO | Admitting: Gastroenterology

## 2021-09-21 LAB — SURGICAL PATHOLOGY

## 2021-10-14 ENCOUNTER — Encounter: Payer: Self-pay | Admitting: Gastroenterology

## 2021-10-14 ENCOUNTER — Ambulatory Visit: Payer: Medicare HMO | Admitting: Gastroenterology

## 2021-10-14 DIAGNOSIS — Z9229 Personal history of other drug therapy: Secondary | ICD-10-CM | POA: Diagnosis not present

## 2021-10-14 DIAGNOSIS — R1310 Dysphagia, unspecified: Secondary | ICD-10-CM | POA: Diagnosis not present

## 2021-10-14 DIAGNOSIS — Z800 Family history of malignant neoplasm of digestive organs: Secondary | ICD-10-CM

## 2021-10-14 NOTE — Progress Notes (Signed)
? ?HPI: ?This is a very pleasant 76 year old woman who was referred to me by [name]., PA-C  to evaluate history of dysphagia, history of Barrett's,.   ? ?I am meeting her for the first time today.  Her sister died of colon cancer.  She was diagnosed with colon cancer around age 89 or so.  The patient's last colonoscopy was almost 5 years ago, see below.  She has mild chronic constipation for which she takes fiber supplements and stool softeners.  These seem to help.  Her weight is up 15 pounds in the last year or so ? ?For a "very long time" she periodically gets choked on pills, food at times.  Never liquids.  She says she has had Barrett's esophagus and Dr. Iona Beard or used to examine her esophagus every single year by EGD.  He would also dilate her esophagus at those times.  I think her most recent EGD was 2018, see below.  I think it is important to notice that no Barrett's was seen at the time of that exam. ? ?She has antiphospholipid antibody syndrome and has had multiple blood clots.  She tells me her Coumadin cannot be held for any longer than 3 days. ? ?Her previous hematologist retired, she does not have a new one. ? ?Old Data Reviewed: ?Colonoscopy, Dr. Watt Climes, July 2018 indication family history of colon cancer, personal history of colon polyps.  Finding single subcentimeter polyp.  Also left-sided diverticulosis.  Pathology showed this was a tubular adenoma. ? ?EGD, Dr. Watt Climes, July 2018 indication "dysphagia, follow-up Barrett's esophagus."  Findings normal larynx normal duodenum, history of Nissen fundoplication evidence noted, the esophagus was empirically dilated up to 16 mm with a Savary dilator.  No Barrett's was noted, no strictures were noted. ? ?CT scan abdomen pelvis with IV and oral contrast July 2021 ordered by Dr. Watt Climes ndication "blood in stools, weight loss, history of breast cancer."  Findings no acute findings to explain her symptoms.  Also "postoperative changes about the  stomach and spleen with lobular splenic contours, unchanged from previous imaging." ? ?She takes Coumadin for antiphospholipid antibody syndrome, h/o dvt ? ?It looks like she has had esophageal manometry many years ago.  I do not have those results.  I think it was a work-up for her Nissen fundoplication which she underwent in 2014 with Dr. Hassell Done. ? ? ? ?Review of systems: ?Pertinent positive and negative review of systems were noted in the above HPI section. All other review negative. ? ? ?Past Medical History:  ?Diagnosis Date  ? Anxiety   ? severe panic attacks  ? Arthritis   ? Aseptic meningitis   ? Asthma   ? as child  ? Breast cancer ([place])   ? right  ? Chronic anticoagulation 08/04/2015  ? Collapsed lung   ? hx of, left; following left thoracentesis for post-operative pleural effusion > 20 years ago  ? Complication of anesthesia   ? "hard to put asleep"; awareness under anesthesia > 35 years ago for hysterectomy  ? Depression   ? Fibromyalgia   ? GERD (gastroesophageal reflux disease)   ? H/O hiatal hernia   ? Headache(784.0)   ? Hepatitis 1990  ? "from eating at restaurant"  ? History of antiphospholipid antibody syndrome   ? History of DVT (deep vein thrombosis)   ? in all fingers  ? Hx-TIA (transient ischemic attack)   ? Hyperlipidemia   ? Hypertension   ? Hypothyroidism   ? Neuromuscular disorder (  [place])   ? Personal history of radiation therapy   ? Pneumonia   ? hx of  ? Soft tissue mass 06/14/2017  ? 3 cm right post axilla same side as previous breast cancer 06/14/17  ? Stroke [facility])   ? 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 Left digital artery  October 1998 - new dx antiphospholipid antibody syndrome  ? ? ?Past Surgical History:  ?Procedure Laterality Date  ? ABDOMINAL HYSTERECTOMY    ? BREAST LUMPECTOMY Right 2000  ? BREAST LUMPECTOMY Right 09/15/2021  ? Procedure: RIGHT BREAST LUMPECTOMY;  Surgeon: Erroll Luna, MD;  Location: [facility];  Service: General;  Laterality:  Right;  ? BREAST SURGERY Right   ? x3  ? BUNIONECTOMY Bilateral 30 years ago  ? CARDIAC CATHETERIZATION  07/12/1997  ? NORMAL LEFT VENTRICULAR FUNCTION WITH EF AT LEAST 70-75%  ? ENDARTERECTOMY Right 04/23/2021  ? Procedure: RIGHT CAROTID ENDARTERECTOMY;  Surgeon: Waynetta Sandy, MD;  Location: [facility];  Service: Vascular;  Laterality: Right;  ? ESOPHAGEAL MANOMETRY N/A 11/06/2012  ? Procedure: ESOPHAGEAL MANOMETRY (EM);  Surgeon: Pedro Earls, MD;  Location: [facility];  Service: General;  Laterality: N/A;  ? ESOPHAGOGASTRODUODENOSCOPY ENDOSCOPY    ? KNEE ARTHROSCOPY Right   ? LAPAROSCOPIC NISSEN FUNDOPLICATION N/A 8/92/1194  ? Procedure: LAPAROSCOPIC TAKEDOWN  PERI-HIATAL HERNIA    REPAIR;  Surgeon: Pedro Earls, MD;  Location: [facility];  Service: General;  Laterality: N/A;  ? LEFT OOPHORECTOMY  1982  ? PATCH ANGIOPLASTY Right 04/23/2021  ? Procedure: PATCH ANGIOPLASTY;  Surgeon: Waynetta Sandy, MD;  Location: [facility];  Service: Vascular;  Laterality: Right;  ? [place]  ? THYROIDECTOMY  in 20's  ? TONSILLECTOMY  76 years old  ? UPPER GI ENDOSCOPY N/A 12/12/2012  ? Procedure: UPPER GI ENDOSCOPY;  Surgeon: Pedro Earls, MD;  Location: [facility];  Service: General;  Laterality: N/A;  ? ? ?Current Outpatient Medications  ?Medication Instructions  ? ALPRAZolam (XANAX) 1 mg, Oral, 2 times daily  ? aspirin 81 mg, Oral, Every evening  ? Calcium Carb-Cholecalciferol (CALCIUM-VITAMIN D) 600-400 MG-UNIT TABS 1 tablet, Oral, Daily  ? fluticasone (FLONASE) 50 MCG/ACT nasal spray 2 sprays, Nasal, Daily PRN  ? HYDROcodone-acetaminophen (NORCO/VICODIN) 5-325 MG tablet 1 tablet, Oral, Every 6 hours PRN  ? levothyroxine (SYNTHROID) 125 mcg, Oral, See admin instructions, Take 125 mcg by mouth on Monday and Friday  ? levothyroxine (SYNTHROID) 112 mcg, Oral, See admin instructions, Take 112 mcg by mouth daily on Sun, Tues, Wed, Thurs, Sat  ? magnesium oxide (MAG-OX) 400 mg, Oral, Daily  ?  metoprolol tartrate (LOPRESSOR) 12.5 mg, Oral, 2 times daily  ? omeprazole (PRILOSEC) 20 mg, Oral, Daily  ? pravastatin (PRAVACHOL) 80 mg, Oral, Daily  ? sertraline (ZOLOFT) 100 mg, Oral, Daily  ? Vitamin B 12 500 mcg, Oral, Daily  ? vitamin C 1,000 mg, Oral, Daily  ? warfarin (COUMADIN) 5 MG tablet Take 5 mg daily except on Mondays: take 2.5 mg (1/2 tablet)  ? ? ?Allergies as of 10/14/2021 - Review Complete 10/14/2021  ?Allergen Reaction Noted  ? Bee venom Anaphylaxis 02/23/2011  ? Hornet venom Anaphylaxis 05/04/2011  ? Reglan [metoclopramide] Shortness Of Breath and Other (See Comments) 04/17/2013  ? Vitamin k Anaphylaxis and Rash 10/01/2010  ? Baclofen  01/21/2020  ? Cephalexin Other (See Comments) 02/06/2020  ? Contrast media [iodinated contrast media] Other (See Comments) 09/22/2015  ? Benadryl [diphenhydramine hcl] Rash 07/22/2011  ?  Ciprofloxacin Rash 02/17/2016  ? Crestor [rosuvastatin calcium] Other (See Comments) 07/22/2011  ? Cymbalta [duloxetine hcl] Swelling 08/01/2017  ? Daypro [oxaprozin] Rash 07/22/2011  ? Doxycycline Other (See Comments) 12/06/2012  ? Levofloxacin Rash 07/22/2011  ? Sulfonamide derivatives Rash 10/01/2010  ? Zocor [simvastatin] Other (See Comments) 07/22/2011  ? ? ?Family History  ?Problem Relation Age of Onset  ? Alzheimer's disease Mother   ? Heart disease Mother   ? Hyperlipidemia Mother   ? Hypertension Mother   ? Colon cancer Sister   ? Heart disease Sister   ? Hypertension Sister   ? Heart attack Sister   ? Heart disease Brother   ? Heart attack Brother   ? Cancer Maternal Grandmother   ?     colon  ? Cancer Daughter   ? Hypertension Daughter   ? Hypertension Son   ? ? ?Social History  ? ?Socioeconomic History  ? Marital status: Single  ?  Spouse name: Not on file  ? Number of children: 2  ? Years of education: 82  ? Highest education level: Not on file  ?Occupational History  ? Not on file  ?Tobacco Use  ? Smoking status: Never  ? Smokeless tobacco: Never  ?Vaping Use  ?  Vaping Use: Never used  ?Substance and Sexual Activity  ? Alcohol use: No  ?  Alcohol/week: 0.0 standard drinks  ? Drug use: No  ? Sexual activity: Never  ?Other Topics Concern  ? Not on file  ?Social History [place]

## 2021-10-14 NOTE — Patient Instructions (Signed)
If you are age 75 or older, your body mass index should be between 23-30. Your Body mass index is 30.82 kg/m?[name] If this is out of the aforementioned range listed, please consider follow up with your Primary Care Provider. ?________________________________________________________ ? ?The [facility] providers would like to encourage you to use [facility] to communicate with providers for non-urgent requests or questions.  Due to long hold times on the telephone, sending your provider a message by [facility] may be a faster and more efficient way to get a response.  Please allow 48 business hours for a response.  Please remember that this is for non-urgent requests.  ?_______________________________________________________ ? ?We have referred you to Hematology for advice on coumadin management.  Once you have been seen by Hematology, we will schedule you for endoscopy and colonoscopy. ? ?Thank you for entrusting me with your care and choosing [facility]. ? ?Dr Ardis Hughs ? ?

## 2021-10-16 ENCOUNTER — Telehealth: Payer: Self-pay | Admitting: Hematology and Oncology

## 2021-10-16 NOTE — Telephone Encounter (Signed)
Scheduled appt per 03/24 referral. Pt is aware of appt date and time. Pt is aware to arrive 15 mins prior to appt time and to bring and updated insurance card. Pt is aware of appt location.   ?

## 2021-10-21 ENCOUNTER — Encounter: Payer: Self-pay | Admitting: Family

## 2021-10-21 ENCOUNTER — Ambulatory Visit: Payer: Medicare HMO | Admitting: Family

## 2021-10-21 ENCOUNTER — Ambulatory Visit (INDEPENDENT_AMBULATORY_CARE_PROVIDER_SITE_OTHER): Payer: Medicare HMO

## 2021-10-21 ENCOUNTER — Other Ambulatory Visit: Payer: Self-pay

## 2021-10-21 DIAGNOSIS — M79645 Pain in left finger(s): Secondary | ICD-10-CM

## 2021-10-21 MED ORDER — LIDOCAINE HCL 1 % IJ SOLN
0.5000 mL | INTRAMUSCULAR | Status: AC | PRN
  Administered 2021-10-21: .5 mL

## 2021-10-21 MED ORDER — METHYLPREDNISOLONE ACETATE 40 MG/ML IJ SUSP
20.0000 mg | INTRAMUSCULAR | Status: AC | PRN
  Administered 2021-10-21: 20 mg

## 2021-10-21 NOTE — Progress Notes (Signed)
? ?Office Visit Note ?  ?Patient: [name]           ?Date of Birth: 12-15-1945           ?MRN: [id number] ?Visit Date: 10/21/2021 ?             ?Requested by: [name]., PA-C ?[id number] ?[place],  [place] [id number] ?PCP: [name]., PA-C ? ?Chief Complaint  ?Patient presents with  ? Left Thumb - Pain  ? ? ? ? ?HPI: ?The patient is a 76 year old woman who presents today complaining of left thumb pain.  Pain at the base of her thumb some stiffness and locking she states she has not yet had to physically straighten it with the other hand.  She is tenderness and a palpable knot at the base of her thumb she states she is unable to grab and use the thumb to hold things.  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 ?States this is new for her ? ?Assessment & Plan: ?Visit Diagnoses:  ?1. Pain of left thumb   ? ? ?Plan: Depo-Medrol injection.  Patient tolerated well.  Discussed possibility of repeat injection if this does not improve otherwise possibility of A1 pulley release. ? ?Follow-Up Instructions: No follow-ups on file.  ? ?Left Hand Exam  ? ?Tenderness  ?The patient is experiencing tenderness in the palmar area.  ? ?Range of Motion  ?The patient has normal left wrist ROM. ? ?Muscle Strength  ?The patient has normal left wrist strength. ? ?Tests  ?Finkelstein's test: negative ? ?Other  ?Erythema: absent ?Sensation: normal ? ?Comments:  Palpable nodule to A1 pulley ? ? ? ? ?Patient is alert, oriented, no adenopathy, well-dressed, normal affect, normal respiratory effort. ? ? ?Imaging: ?No results found. ?No images are attached to the encounter. ? ?Labs: ?Lab Results  ?Component Value Date  ? HGBA1C 6.3 (H) 06/28/2017  ? HGBA1C 6.0 (H) 04/14/2016  ? ESRSEDRATE 5 06/28/2017  ? ESRSEDRATE 11 04/18/2016  ? ESRSEDRATE 11 11/04/2009  ? CRP 3.6  06/28/2017  ? CRP 1.3 (H) 04/18/2016  ? REPTSTATUS 04/20/2016 FINAL 04/16/2016  ? GRAMSTAIN  04/16/2016  ?  CYTOSPIN SMEAR ?WBC PRESENT, PREDOMINANTLY MONONUCLEAR ?NO ORGANISMS SEEN ?  ? CULT NO GROWTH 3 DAYS 04/16/2016  ? ? ? ?Lab Results  ?Component Value Date  ? ALBUMIN 3.8 09/14/2021  ? ALBUMIN 4.3 04/20/2021  ? ALBUMIN 4.7 03/25/2021  ? ? ?Lab Results  ?Component Value Date  ? MG 2.5 (H) 01/11/2018  ? MG 2.2 10/07/2016  ? MG 2.2 04/15/2016  ? ?Lab Results  ?Component Value Date  ? VD25OH 41.5 06/28/2017  ? ? ?No results found for: PREALBUMIN ? ?  Latest Ref Rng & Units 09/14/2021  ?  9:45 AM 04/24/2021  ?  3:31 AM 04/23/2021  ? 11:52 AM  ?CBC EXTENDED  ?WBC 4.0 - 10.5 K/uL 7.3   9.6   8.5    ?RBC 3.87 - 5.11 MIL/uL 4.11   3.49   3.63    ?Hemoglobin 12.0 - 15.0 g/dL 12.0   10.5   10.8    ?HCT 36.0 - 46.0 % 37.2   32.3   33.1    ?Platelets 150 - 400 K/uL 211   189   191    ?NEUT# 1.7 - 7.7 K/uL 3.9      ?  Lymph# 0.7 - 4.0 K/uL 2.3      ? ? ? ?There is no height or weight on file to calculate BMI. ? ?Orders:  ?Orders Placed This Encounter  ?Procedures  ? XR Finger Thumb Left  ? ?No orders of the defined types were placed in this encounter. ? ? ? Procedures: ?Hand/UE Inj: L thumb A1 for trigger finger on 10/21/2021 10:16 AM ?Indications: diagnostic and therapeutic ?Details: 22 G needle ?Medications: 0.5 mL lidocaine 1 %; 20 mg methylPREDNISolone acetate 40 MG/ML ?Outcome: tolerated well, no immediate complications ?Procedure, treatment alternatives, risks and benefits explained, specific risks discussed. Consent was given by the patient. Immediately prior to procedure a time out was called to verify the correct patient, procedure, equipment, support staff and site/side marked as required. Patient was prepped and draped in the usual sterile fashion.  ? ? ? ?Clinical Data: ?No additional findings. ? ?ROS: ? ?All other systems negative, except as noted in the HPI. ?Review of Systems ? ?Objective: ?Vital Signs: There were  no vitals taken for this visit. ? ?Specialty Comments:  ?No specialty comments available. ? ?PMFS History: ?Patient Active Problem List  ? Diagnosis Date Noted  ? Creatinine elevation 07/01/2021  ? Acute urinary tract infection 07/01/2021  ? Allergic to bugs 07/01/2021  ? Antiphospholipid antibody positive 07/01/2021  ? Anxiety 07/01/2021  ? Bug bite with infection 07/01/2021  ? Cellulitis 07/01/2021  ? Cold sore 07/01/2021  ? Costal chondritis 07/01/2021  ? Cramps of lower extremity 07/01/2021  ? Difficult or painful urination 07/01/2021  ? Encounter for general adult medical examination without abnormal findings 07/01/2021  ? Family history of malignant neoplasm of gastrointestinal tract 07/01/2021  ? Hematochezia 07/01/2021  ? Abnormal findings on diagnostic imaging of other parts of digestive tract 07/01/2021  ? Need for vaccination 07/01/2021  ? Pain in rib 07/01/2021  ? Personal history of colonic polyps 07/01/2021  ? Restless leg 07/01/2021  ? Right lower quadrant pain 07/01/2021  ? Tick bite 07/01/2021  ? Weight loss 07/01/2021  ? Carotid stenosis, symptomatic w/o infarct, right 04/23/2021  ? Diaphragmatic hernia 01/08/2021  ? Flatulence, eructation and gas pain 01/08/2021  ? History of breast cancer 07/09/2020  ? 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 Impacted cerumen of right ear 04/07/2020  ? Lump in lower outer quadrant of right breast 02/08/2019  ? Diarrhea 06/29/2018  ? Nausea and vomiting 06/29/2018  ? Elevated blood sugar 06/20/2018  ? Recurrent major depressive disorder, in partial remission ([place]) 06/20/2018  ? Bilateral primary osteoarthritis of knee 12/27/2017  ? Acute infection of nasal sinus 07/21/2017  ? Prediabetes 06/29/2017  ? Soft tissue mass 06/14/2017  ? Displaced fracture of fifth metatarsal bone of right foot 02/07/2017  ? Unilateral primary osteoarthritis, left knee 11/26/2016  ? Unilateral primary osteoarthritis, right knee 11/26/2016  ? Left leg pain   ? Abnormal MRI of head    ? Vision changes 04/13/2016  ? Orthostatic hypotension 03/10/2016  ? Vertigo 03/10/2016  ? Depression 11/25/2015  ? Essential (primary) hypertension 11/25/2015  ? Hyperlipidemia 11/25/2015  ? Temporary cerebral vascular dysfunction 11/25/2015  ? Primary malignant neoplasm ([place]) 11/25/2015  ? Sleep apnea 11/25/2015  ? Chronic anticoagulation 08/04/2015  ? History of anticoagulant therapy 08/04/2015  ? Pain in limb 12/26/2013  ? Chest pain 11/27/2013  ? GERD - post failed open Nissen 11/23/2012  ? Dysphagia, unspecified(787.20) 09/28/2012  ? Dysphagia 09/28/2012  ? S/P Open Nissen 1998 Dr. Mendel Ryder 08/18/2012  ? Thrombosis, upper extremity artery (  [place]) 04/17/2012  ? Panic disorder (episodic paroxysmal anxiety) 12/07/2011  ? Antiphospholipid antibody syndrome ([place]) 06/01/2011  ? Lupus anticoagulant positive 06/01/2011  ? Chest pain   ? Hx-TIA (transient ischemic attack)   ? Hypothyroidism   ? Breast cancer ([place])   ? History of DVT (deep vein thrombosis)   ? History of antiphospholipid antibody syndrome   ? ?Past Medical History:  ?Diagnosis Date  ? Anxiety   ? severe panic attacks  ? Arthritis   ? Aseptic meningitis   ? Asthma   ? as child  ? Breast cancer ([place])   ? right  ? Chronic anticoagulation 08/04/2015  ? Collapsed lung   ? hx of, left; following left thoracentesis for post-operative pleural effusion > 20 years ago  ? Complication of anesthesia   ? "hard to put asleep"; awareness under anesthesia > 35 years ago for hysterectomy  ? Depression   ? Fibromyalgia   ? GERD (gastroesophageal reflux disease)   ? H/O hiatal hernia   ? Headache(784.0)   ? Hepatitis 1990  ? "from eating at restaurant"  ? History of antiphospholipid antibody syndrome   ? History of DVT (deep vein thrombosis)   ? in all fingers  ? Hx-TIA (transient ischemic attack)   ? Hyperlipidemia   ? Hypertension   ? Hypothyroidism   ? Neuromuscular disorder ([place])   ? Personal history of radiation therapy   ? Pneumonia   ? hx of  ? Soft tissue mass  06/14/2017  ? 3 cm right post axilla same side as previous breast cancer 06/14/17  ? Stroke [facility])   ? 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 Left digital artery  October 1998 - new dx antiphospho

## 2021-10-23 ENCOUNTER — Emergency Department (HOSPITAL_BASED_OUTPATIENT_CLINIC_OR_DEPARTMENT_OTHER)
Admission: EM | Admit: 2021-10-23 | Discharge: 2021-10-23 | Disposition: A | Discharge status: Home / Self Care | Payer: Medicare HMO | Attending: Emergency Medicine | Admitting: Emergency Medicine

## 2021-10-23 ENCOUNTER — Encounter (HOSPITAL_BASED_OUTPATIENT_CLINIC_OR_DEPARTMENT_OTHER): Payer: Self-pay | Admitting: Emergency Medicine

## 2021-10-23 ENCOUNTER — Emergency Department (HOSPITAL_BASED_OUTPATIENT_CLINIC_OR_DEPARTMENT_OTHER): Payer: Medicare HMO

## 2021-10-23 ENCOUNTER — Other Ambulatory Visit: Payer: Self-pay

## 2021-10-23 DIAGNOSIS — M79671 Pain in right foot: Secondary | ICD-10-CM | POA: Diagnosis not present

## 2021-10-23 DIAGNOSIS — S0083XA Contusion of other part of head, initial encounter: Secondary | ICD-10-CM | POA: Diagnosis not present

## 2021-10-23 DIAGNOSIS — S0990XA Unspecified injury of head, initial encounter: Secondary | ICD-10-CM

## 2021-10-23 DIAGNOSIS — Z7982 Long term (current) use of aspirin: Secondary | ICD-10-CM | POA: Diagnosis not present

## 2021-10-23 DIAGNOSIS — Z7901 Long term (current) use of anticoagulants: Secondary | ICD-10-CM | POA: Diagnosis not present

## 2021-10-23 DIAGNOSIS — W01198A Fall on same level from slipping, tripping and stumbling with subsequent striking against other object, initial encounter: Secondary | ICD-10-CM | POA: Insufficient documentation

## 2021-10-23 NOTE — ED Triage Notes (Addendum)
Last night she was bring up trash can and fell hit her head , she is on coumadin ,  her sis drove her today denies loc , hurt her  rt knee and rt foot, pt arrives with 89 year mother who is also pt  ?

## 2021-10-23 NOTE — ED Provider Notes (Signed)
?[facility] ?Provider Note ? ? ?CSN: [id number] ?Arrival date & time: 10/23/21  1310 ? ?  ? ?History ? ?Chief Complaint  ?Patient presents with  ? Fall  ? ? ?[name] is a 76 y.o. female on Coumadin presenting to the ED with a head injury and right toe pain after accidentally stepping the wrong way on a trash can yesterday.  She felt dazed after she struck her head.  She reports he has a mild headache overnight.  She is on Coumadin with no dose changes recently.  She is also having some pain in her right little toe, but is able to walk on it. ? ?HPI ? ?  ? ?Home Medications ?Prior to Admission medications   ?Medication Sig Start Date End Date Taking? Authorizing Provider  ?ALPRAZolam (XANAX) 1 MG tablet Take 1 mg by mouth 2 (two) times daily.  12/22/19   [provider]  ?Ascorbic Acid (VITAMIN C) 1000 MG tablet Take 1,000 mg by mouth daily.    [provider]  ?aspirin 81 MG tablet Take 81 mg by mouth every evening.    [provider]  ?Calcium Carb-Cholecalciferol (CALCIUM-VITAMIN D) 600-400 MG-UNIT TABS Take 1 tablet by mouth daily.    [provider]  ?Cyanocobalamin (VITAMIN B 12) 500 MCG TABS Take 500 mcg by mouth daily.    [provider]  ?fluticasone (FLONASE) 50 MCG/ACT nasal spray Place 2 sprays into the nose daily as needed for allergies.  10/16/14   [provider]  ?HYDROcodone-acetaminophen (NORCO/VICODIN) 5-325 MG tablet Take 1 tablet by mouth every 6 (six) hours as needed for moderate pain. 09/15/21   Cornett, Marcello Moores, MD  ?levothyroxine (SYNTHROID, LEVOTHROID) 112 MCG tablet Take 112 mcg by mouth See admin instructions. Take 112 mcg by mouth daily on Sun, Tues, Wed, Thurs, Sat    [provider]  ?levothyroxine (SYNTHROID, LEVOTHROID) 125 MCG tablet Take 125 mcg by mouth See admin instructions. Take 125 mcg by mouth on Monday and Friday 06/24/16   [provider]  ?magnesium oxide (MAG-OX) 400  MG tablet Take 400 mg by mouth daily.    [provider]  ?metoprolol tartrate (LOPRESSOR) 25 MG tablet Take 12.5 mg by mouth 2 (two) times daily.    [provider]  ?omeprazole (PRILOSEC) 20 MG capsule Take 20 mg by mouth daily.  02/06/13   [provider]  ?pravastatin (PRAVACHOL) 80 MG tablet Take 80 mg by mouth daily.    [provider]  ?sertraline (ZOLOFT) 100 MG tablet Take 100 mg by mouth daily. 01/20/20   [provider]  ?warfarin (COUMADIN) 5 MG tablet Take 5 mg daily except on Mondays: take 2.5 mg (1/2 tablet) 05/11/18   Annia Belt, MD  ?   ? ?Allergies    ?Bee venom, Hornet venom, Reglan [metoclopramide], Vitamin k, Baclofen, Cephalexin, Contrast media [iodinated contrast media], Benadryl [diphenhydramine hcl], Ciprofloxacin, Crestor [rosuvastatin calcium], Cymbalta [duloxetine hcl], Daypro [oxaprozin], Doxycycline, Levofloxacin, Sulfonamide derivatives, and Zocor [simvastatin]   ? ?Review of Systems   ?Review of Systems ? ?Physical Exam ?Updated Vital Signs ?BP 140/79   Pulse 64   Temp 98 ?F (36.7 ?C) (Oral)   Resp 14   Ht '5\' 5"'$  (1.651 m)   Wt 84 kg   SpO2 97%   BMI 30.82 kg/m?  ?Physical Exam ?Constitutional:   ?   General: She is not in acute distress. ?HENT:  ?   Head: Normocephalic.  ?  Comments: Mild frontal forehead hematoma ?Eyes:  ?   Conjunctiva/sclera: Conjunctivae normal.  ?   Pupils: Pupils are equal, round, and reactive to light.  ?Cardiovascular:  ?   Rate and Rhythm: Normal rate.  ?   Pulses: Normal pulses.  ?Pulmonary:  ?   Effort: Pulmonary effort is normal. No respiratory distress.  ?Musculoskeletal:  ?   Comments: Mild tenderness of the right distal fifth toe, without tenderness at the base of the fifth toe, no midfoot tenderness, patient able to bear weight ?[place] ankle criteria negative  ?Skin: ?   General: Skin is warm and dry.  ?Neurological:  ?   General: No focal deficit present.  ?   Mental Status: She is alert.  Mental status is at baseline.  ?Psychiatric:     ?   Mood and Affect: Mood normal.     ?   Behavior: Behavior normal.  ? ? ?ED Results / Procedures / Treatments   ?Labs ?(all labs ordered are listed, but only abnormal results are displayed) ?Labs Reviewed - No data to display ? ?EKG ?None ? ?Radiology ?CT Head Wo Contrast ? ?Result Date: 10/23/2021 ?CLINICAL DATA:  Provided history: Head trauma, minor. Additional history provided: Fall (hitting head). Forehead hematoma. EXAM: CT HEAD WITHOUT CONTRAST TECHNIQUE: Contiguous axial images were obtained from the base of the skull through the vertex without intravenous contrast. RADIATION DOSE REDUCTION: This exam was performed according to the departmental dose-optimization program which includes automated exposure control, adjustment of the mA and/or kV according to patient size and/or use of iterative reconstruction technique. COMPARISON:  CT angiogram head 03/26/2021. FINDINGS: Brain: Mild generalized parenchymal atrophy. Mild patchy and ill-defined hypoattenuation within the cerebral white matter, nonspecific but compatible with chronic small vessel ischemic disease. There is no acute intracranial hemorrhage. No demarcated cortical infarct. No extra-axial fluid collection. No evidence of an intracranial mass. No midline shift. Vascular: No hyperdense vessel. Atherosclerotic calcifications. Skull: Normal. Negative for fracture or focal lesion. Sinuses/Orbits: Visualized orbits show no acute finding. Frothy secretions within the left sphenoid sinus. IMPRESSION: No evidence of acute intracranial abnormality. Mild chronic small vessel ischemic changes within the cerebral white matter. Mild generalized parenchymal atrophy. Left sphenoid sinusitis. Electronically Signed   By: Kellie Simmering D.O.   On: 10/23/2021 14:39  ? ?DG Foot Complete Right ? ?Result Date: 10/23/2021 ?CLINICAL DATA:  Fifth metatarsal pain after a fall. EXAM: RIGHT FOOT COMPLETE - 3+ VIEW COMPARISON:  Foot  radiographs from 02/07/2017 at [facility] FINDINGS: Osteopenia. Healed fracture of the distal fifth metatarsal. Degenerative changes of the midfoot. Small Achilles and calcaneal spurs. Hammertoe deformities. IMPRESSION: No acute osseous abnormality. Sequelae of remote fracture involving the distal fifth metatarsal. Electronically Signed   By: Abigail Miyamoto M.D.   On: 10/23/2021 15:02   ? ?Procedures ?Procedures  ? ? ?Medications Ordered in ED ?Medications - No data to display ? ?ED Course/ Medical Decision Making/ A&P ?Clinical Course as of 10/23/21 1818  ?Fri Oct 23, 2021  ?1447 IMPRESSION: ?No evidence of acute intracranial abnormality. ?  ?Mild chronic small vessel ischemic changes within the cerebral white ?matter. ?  ?Mild generalized parenchymal atrophy. ?  ?Left sphenoid sinusitis. [MT]  ?  ?Clinical Course User Index ?[MT] Wyvonnia Dusky, MD  ? ?                        ?Medical Decision Making ?Amount and/or Complexity of Data Reviewed ?Radiology: ordered. ? ? ?Isolated  injuries after mechanical fall.  CT scan of the head and x-ray of the foot ordered and personally reviewed and interpreted, showing no acute traumatic injuries. ? ?No other traumatic injuries noted on my exam.  She is mentating normally.  She is able to ambulate.  Okay for discharge ? ? ? ? ? ? ? ?Final Clinical Impression(s) / ED Diagnoses ?Final diagnoses:  ?Injury of head, initial encounter  ?Traumatic hematoma of forehead, initial encounter  ?Right foot pain  ? ? ?Rx / DC Orders ?ED Discharge Orders   ? ? None  ? ?  ? ? ?  ?Wyvonnia Dusky, MD ?10/23/21 1818 ? ?

## 2021-10-28 NOTE — Progress Notes (Signed)
[facility] ?CONSULT NOTE ? ?Patient Care Team: ?Amie Critchley as PCP - General (Family Medicine) ?Minus Breeding, MD as PCP - Cardiology (Cardiology) ? ?CHIEF COMPLAINTS/PURPOSE OF CONSULTATION: History of antiphospholipid antibody syndrome ? ?HISTORY OF PRESENTING ILLNESS:  ?[name] 76 y.o. female is here because of recent diagnosis of History of lupus anticoagulant. She presents to the clinic today for consult and labs.  Patient has seen Dr. Ardis Hughs who wanted the patient to be seen by hematology so that we can manage her INR.  She needs a colonoscopy and needs to come off anticoagulation for that.  Her INR levels have been fluctuating significantly because she has missed few doses as well as dietary fluctuations. ?If she comes off Coumadin she immediately started to have symptoms and therefore for procedures like colonoscopy, she may only need to be off Coumadin for 3 days instead of the usual 5 days. ? ?I reviewed her records extensively and collaborated the history with the patient. ?  ? ? ?MEDICAL HISTORY:  ?Past Medical History:  ?Diagnosis Date  ? Anxiety   ? severe panic attacks  ? Arthritis   ? Aseptic meningitis   ? Asthma   ? as child  ? Breast cancer ([place])   ? right  ? Chronic anticoagulation 08/04/2015  ? Collapsed lung   ? hx of, left; following left thoracentesis for post-operative pleural effusion > 20 years ago  ? Complication of anesthesia   ? "hard to put asleep"; awareness under anesthesia > 35 years ago for hysterectomy  ? Depression   ? Fibromyalgia   ? GERD (gastroesophageal reflux disease)   ? H/O hiatal hernia   ? Headache(784.0)   ? Hepatitis 1990  ? "from eating at restaurant"  ? History of antiphospholipid antibody syndrome   ? History of DVT (deep vein thrombosis)   ? in all fingers  ? Hx-TIA (transient ischemic attack)   ? Hyperlipidemia   ? Hypertension   ? Hypothyroidism   ? Neuromuscular disorder ([place])   ? Personal history of radiation therapy    ? Pneumonia   ? hx of  ? Soft tissue mass 06/14/2017  ? 3 cm right post axilla same side as previous breast cancer 06/14/17  ? Stroke [facility])   ? 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 Left digital artery  October 1998 - new dx antiphospholipid antibody syndrome  ? ? ?SURGICAL HISTORY: ?Past Surgical History:  ?Procedure Laterality Date  ? ABDOMINAL HYSTERECTOMY    ? BREAST LUMPECTOMY Right 2000  ? BREAST LUMPECTOMY Right 09/15/2021  ? Procedure: RIGHT BREAST LUMPECTOMY;  Surgeon: Erroll Luna, MD;  Location: [facility];  Service: General;  Laterality: Right;  ? BREAST SURGERY Right   ? x3  ? BUNIONECTOMY Bilateral 30 years ago  ? CARDIAC CATHETERIZATION  07/12/1997  ? NORMAL LEFT VENTRICULAR FUNCTION WITH EF AT LEAST 70-75%  ? ENDARTERECTOMY Right 04/23/2021  ? Procedure: RIGHT CAROTID ENDARTERECTOMY;  Surgeon: Waynetta Sandy, MD;  Location: [facility];  Service: Vascular;  Laterality: Right;  ? ESOPHAGEAL MANOMETRY N/A 11/06/2012  ? Procedure: ESOPHAGEAL MANOMETRY (EM);  Surgeon: Pedro Earls, MD;  Location: [facility];  Service: General;  Laterality: N/A;  ? ESOPHAGOGASTRODUODENOSCOPY ENDOSCOPY    ? KNEE ARTHROSCOPY Right   ? LAPAROSCOPIC NISSEN FUNDOPLICATION N/A 6/54/6503  ? Procedure: LAPAROSCOPIC TAKEDOWN  PERI-HIATAL HERNIA    REPAIR;  Surgeon: Pedro Earls, MD;  Location: [facility];  Service: General;  Laterality:  N/A;  ? LEFT OOPHORECTOMY  1982  ? PATCH ANGIOPLASTY Right 04/23/2021  ? Procedure: PATCH ANGIOPLASTY;  Surgeon: Waynetta Sandy, MD;  Location: [facility];  Service: Vascular;  Laterality: Right;  ? [place]  ? THYROIDECTOMY  in 20's  ? TONSILLECTOMY  76 years old  ? UPPER GI ENDOSCOPY N/A 12/12/2012  ? Procedure: UPPER GI ENDOSCOPY;  Surgeon: Pedro Earls, MD;  Location: [facility];  Service: General;  Laterality: N/A;  ? ? ?SOCIAL HISTORY: ?Social History  ? ?Socioeconomic History  ? Marital status: Single  ?  Spouse name: Not on  file  ? Number of children: 2  ? Years of education: 44  ? Highest education level: Not on file  ?Occupational History  ? Not on file  ?Tobacco Use  ? Smoking status: Never  ? Smokeless tobacco: Never  ?Vaping Use  ? Vaping Use: Never used  ?Substance and Sexual Activity  ? Alcohol use: No  ?  Alcohol/week: 0.0 standard drinks  ? Drug use: No  ? Sexual activity: Never  ?Other Topics Concern  ? Not on file  ?Social History Narrative  ? Lives at home with her mother.  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  ? Right-handed.  ? 2-4 cups caffeine daily.  ? ?Social Determinants of Health  ? ?Financial Resource Strain: Not on file  ?Food Insecurity: Not on file  ?Transportation Needs: Not on file  ?Physical Activity: Not on file  ?Stress: Not on file  ?Social Connections: Not on file  ?Intimate Partner Violence: Not on file  ? ? ?FAMILY HISTORY: ?Family History  ?Problem Relation Age of Onset  ? Alzheimer's disease Mother   ? Heart disease Mother   ? Hyperlipidemia Mother   ? Hypertension Mother   ? Colon cancer Sister   ? Heart disease Sister   ? Hypertension Sister   ? Heart attack Sister   ? Heart disease Brother   ? Heart attack Brother   ? Cancer Maternal Grandmother   ?     colon  ? Cancer Daughter   ? Hypertension Daughter   ? Hypertension Son   ? ? ?ALLERGIES:  is allergic to bee venom, hornet venom, reglan [metoclopramide], vitamin k, baclofen, cephalexin, contrast media [iodinated contrast media], benadryl [diphenhydramine hcl], ciprofloxacin, crestor [rosuvastatin calcium], cymbalta [duloxetine hcl], daypro [oxaprozin], doxycycline, levofloxacin, sulfonamide derivatives, and zocor [simvastatin]. ? ?MEDICATIONS:  ?Current Outpatient Medications  ?Medication Sig Dispense Refill  ? ALPRAZolam (XANAX) 1 MG tablet Take 1 mg by mouth 2 (two) times daily.     ? Ascorbic Acid (VITAMIN C) 1000 MG tablet Take 1,000 mg by mouth daily.    ? aspirin 81 MG tablet Take 81 mg by mouth every evening.    ? Calcium  Carb-Cholecalciferol (CALCIUM-VITAMIN D) 600-400 MG-UNIT TABS Take 1 tablet by mouth daily.    ? Cyanocobalamin (VITAMIN B 12) 500 MCG TABS Take 500 mcg by mouth daily.    ? fluticasone (FLONASE) 50 MCG/ACT nasal spray Place 2 sprays into the nose daily as needed for allergies.     ? HYDROcodone-acetaminophen (NORCO/VICODIN) 5-325 MG tablet Take 1 tablet by mouth every 6 (six) hours as needed for moderate pain. 15 tablet 0  ? levothyroxine (SYNTHROID, LEVOTHROID) 112 MCG tablet Take 112 mcg by mouth See admin instructions. Take 112 mcg by mouth daily on Sun, Tues, Wed, Thurs, Sat    ? levothyroxine (SYNTHROID, LEVOTHROID) 125 MCG tablet Take 125 mcg by mouth  See admin instructions. Take 125 mcg by mouth on Monday and Friday    ? magnesium oxide (MAG-OX) 400 MG tablet Take 400 mg by mouth daily.    ? metoprolol tartrate (LOPRESSOR) 25 MG tablet Take 12.5 mg by mouth 2 (two) times daily.    ? omeprazole (PRILOSEC) 20 MG capsule Take 20 mg by mouth daily.     ? pravastatin (PRAVACHOL) 80 MG tablet Take 80 mg by mouth daily.    ? sertraline (ZOLOFT) 100 MG tablet Take 100 mg by mouth daily.    ? warfarin (COUMADIN) 5 MG tablet Take 5 mg daily except on Mondays: take 2.5 mg (1/2 tablet) 90 tablet 3  ? ?No current facility-administered medications for this visit.  ? ? ?REVIEW OF SYSTEMS:   ?Constitutional: Denies fevers, chills or abnormal night sweats ?All other systems were reviewed with the patient and are negative. ? ?PHYSICAL EXAMINATION: ?ECOG PERFORMANCE STATUS: 1 - Symptomatic but completely ambulatory ? ?Vitals:  ? 10/29/21 1316  ?BP: 133/66  ?Pulse: 81  ?Resp: 18  ?Temp: 97.8 ?F (36.6 ?C)  ?SpO2: 98%  ? ?Filed Weights  ? 10/29/21 1316  ?Weight: 184 lb 3.2 oz (83.6 kg)  ? ?  ? ?LABORATORY DATA:  ?I have reviewed the data as listed ?Lab Results  ?Component Value Date  ? WBC 7.3 09/14/2021  ? HGB 12.0 09/14/2021  ? HCT 37.2 09/14/2021  ? MCV 90.5 09/14/2021  ? PLT 211 09/14/2021  ? ?Lab Results  ?Component Value  Date  ? NA 135 09/14/2021  ? K 4.7 09/14/2021  ? CL 102 09/14/2021  ? CO2 25 09/14/2021  ? ? ?RADIOGRAPHIC STUDIES: ?I have personally reviewed the radiological reports and agreed with the findings in the

## 2021-10-29 ENCOUNTER — Inpatient Hospital Stay: Payer: Medicare HMO | Attending: Hematology and Oncology | Admitting: Hematology and Oncology

## 2021-10-29 ENCOUNTER — Other Ambulatory Visit: Payer: Self-pay

## 2021-10-29 ENCOUNTER — Inpatient Hospital Stay: Payer: Medicare HMO

## 2021-10-29 DIAGNOSIS — Z809 Family history of malignant neoplasm, unspecified: Secondary | ICD-10-CM | POA: Insufficient documentation

## 2021-10-29 DIAGNOSIS — Z800 Family history of malignant neoplasm of digestive organs: Secondary | ICD-10-CM | POA: Diagnosis not present

## 2021-10-29 DIAGNOSIS — Z862 Personal history of diseases of the blood and blood-forming organs and certain disorders involving the immune mechanism: Secondary | ICD-10-CM

## 2021-10-29 LAB — PROTIME-INR
INR: 1.9 — ABNORMAL HIGH (ref 0.8–1.2)
Prothrombin Time: 21.7 seconds — ABNORMAL HIGH (ref 11.4–15.2)

## 2021-10-29 NOTE — Assessment & Plan Note (Signed)
History of antiphospholipid antibody syndrome with prior history of DVT ?Counseling: I discussed with the patient the mechanism of hypercoagulability related to antiphospholipid antibodies. ?Recommendation: Anticoagulation for life with Coumadin. ?No additional work-up is necessary. ?Return to clinic on an as-needed basis ?

## 2021-10-30 ENCOUNTER — Telehealth: Payer: Self-pay | Admitting: Hematology and Oncology

## 2021-10-30 NOTE — Telephone Encounter (Signed)
Scheduled appointment per 4/6 los. Patient is aware. ?

## 2021-11-02 ENCOUNTER — Telehealth: Payer: Self-pay

## 2021-11-02 NOTE — Telephone Encounter (Signed)
-----   Message from Gardenia Phlegm, NP sent at 10/30/2021  4:00 PM EDT ----- ?Please fax results to [facility] as they are managing her INR and Coumadin. ?----- Message ----- ?From: Interface, Lab In [place] ?Sent: 10/29/2021   2:17 PM EDT ?To: Nicholas Lose, MD ? ? ?

## 2021-11-02 NOTE — Telephone Encounter (Signed)
Per Dr Chryl Heck, pt to take 7.'5mg'$  daily and recheck INR in 1 week.   ? ?Called and spoke with pt, informed her that INR was 1.9 and we need to increase medication to 7.'5mg'$  daily.  Pt will return for labs on 4/17.  Pt verbalized new dose and appt time back to this nurse.  ?

## 2021-11-04 ENCOUNTER — Other Ambulatory Visit: Payer: Self-pay

## 2021-11-04 DIAGNOSIS — R1310 Dysphagia, unspecified: Secondary | ICD-10-CM

## 2021-11-04 DIAGNOSIS — Z800 Family history of malignant neoplasm of digestive organs: Secondary | ICD-10-CM

## 2021-11-04 MED ORDER — PLENVU 140 G PO SOLR: 1.0000 | ORAL | 0 refills | Status: DC

## 2021-11-09 ENCOUNTER — Other Ambulatory Visit: Payer: Self-pay

## 2021-11-09 ENCOUNTER — Inpatient Hospital Stay: Payer: Medicare HMO

## 2021-11-09 ENCOUNTER — Encounter: Payer: Self-pay | Admitting: Hematology and Oncology

## 2021-11-09 DIAGNOSIS — Z862 Personal history of diseases of the blood and blood-forming organs and certain disorders involving the immune mechanism: Secondary | ICD-10-CM | POA: Diagnosis not present

## 2021-11-09 LAB — PROTIME-INR
INR: 2.9 — ABNORMAL HIGH (ref 0.8–1.2)
Prothrombin Time: 30.4 seconds — ABNORMAL HIGH (ref 11.4–15.2)

## 2021-11-10 ENCOUNTER — Telehealth: Payer: Self-pay | Admitting: Hematology and Oncology

## 2021-11-10 NOTE — Telephone Encounter (Signed)
Per 4/18 in basket called and spoke to pt about appointment  pt confirmed appointment  ?

## 2021-11-11 ENCOUNTER — Telehealth: Payer: Self-pay

## 2021-11-11 NOTE — Telephone Encounter (Signed)
Patient advised that she has been given clearance to hold Coumadin 3 days prior to colonoscopy scheduled for 11-17-21.  Patient advised to take last dose of Coumadin on 11-13-21, and she will be advised when to restart Coumadin by Dr Ardis Hughs after the procedure.  Patient agreed to plan and verbalized understanding.  No further questions.  ? ?

## 2021-11-11 NOTE — Progress Notes (Signed)
? ?Patient Care Team: ?Amie Critchley as PCP - General (Family Medicine) ?Minus Breeding, MD as PCP - Cardiology (Cardiology) ? ?DIAGNOSIS:  ?Encounter Diagnosis  ?Name Primary?  ? Antiphospholipid antibody syndrome (H[facility])   ? ?CHIEF COMPLIANT: History of antiphospholipid antibody syndrome on c ? ?INTERVAL HISTORY: [name] is a 76 y.o. female is here because of recent diagnosis of History of lupus anticoagulant. She presents to the clinic today for labs and follow-up. She state the colonoscopy went great. She state that she had some thrush on tongue.  She is extremely sad because her mother recently passed away.  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  She underwent colonoscopy which was okay. ? ? ?ALLERGIES:  is allergic to bee venom, hornet venom, reglan [metoclopramide], vitamin k, baclofen, cephalexin, contrast media [iodinated contrast media], benadryl [diphenhydramine hcl], ciprofloxacin, crestor [rosuvastatin calcium], cymbalta [duloxetine hcl], daypro [oxaprozin], doxycycline, levofloxacin, sulfonamide derivatives, and zocor [simvastatin]. ? ?MEDICATIONS:  ?Current Outpatient Medications  ?Medication Sig Dispense Refill  ? ALPRAZolam (XANAX) 1 MG tablet Take 1 mg by mouth 2 (two) times daily.     ? Ascorbic Acid (VITAMIN C) 1000 MG tablet Take 1,000 mg by mouth daily.    ? aspirin 81 MG tablet Take 81 mg by mouth every evening.    ? Calcium Carb-Cholecalciferol (CALCIUM-VITAMIN D) 600-400 MG-UNIT TABS Take 1 tablet by mouth daily.    ? clindamycin (CLEOCIN) 300 MG capsule Take 300 mg by mouth 2 (two) times daily.    ? Cyanocobalamin (VITAMIN B 12) 500 MCG TABS Take 500 mcg by mouth daily.    ? fluticasone (FLONASE) 50 MCG/ACT nasal spray Place 2 sprays into the nose daily as needed for allergies.     ? HYDROcodone-acetaminophen (NORCO/VICODIN) 5-325 MG tablet Take 1 tablet by mouth every 6 (six) hours as needed for moderate pain. 15 tablet 0  ? levothyroxine (SYNTHROID,  LEVOTHROID) 112 MCG tablet Take 112 mcg by mouth See admin instructions. Take 112 mcg by mouth daily on Sun, Tues, Wed, Thurs, Sat    ? levothyroxine (SYNTHROID, LEVOTHROID) 125 MCG tablet Take 125 mcg by mouth See admin instructions. Take 125 mcg by mouth on Monday and Friday    ? magnesium oxide (MAG-OX) 400 MG tablet Take 400 mg by mouth daily.    ? metoprolol tartrate (LOPRESSOR) 25 MG tablet Take 12.5 mg by mouth 2 (two) times daily.    ? omeprazole (PRILOSEC) 20 MG capsule Take 20 mg by mouth daily.     ? pravastatin (PRAVACHOL) 80 MG tablet Take 80 mg by mouth daily.    ? sertraline (ZOLOFT) 100 MG tablet Take 100 mg by mouth daily.    ? warfarin (COUMADIN) 5 MG tablet Take 5 mg daily except on Mondays: take 2.5 mg (1/2 tablet) 90 tablet 3  ? ?No current facility-administered medications for this visit.  ? ? ?PHYSICAL EXAMINATION: ?ECOG PERFORMANCE STATUS: 1 - Symptomatic but completely ambulatory ? ?Vitals:  ? 11/25/21 1542  ?BP: 131/75  ?Pulse: 81  ?Resp: 19  ?Temp: 97.8 ?F (36.6 ?C)  ?SpO2: 97%  ? ?Filed Weights  ? 11/25/21 1542  ?Weight: 190 lb 4.8 oz (86.3 kg)  ? ?  ? ?LABORATORY DATA:  ?I have reviewed the data as listed ? ?  Latest Ref Rng & Units 09/14/2021  ?  9:45 AM 04/24/2021  ?  3:31 AM 04/23/2021  ? 11:52 AM  ?CMP  ?Glucose 70 - 99 mg/dL 109  118     ?BUN 8 - 23 mg/dL 24   18     ?Creatinine 0.44 - 1.00 mg/dL 1.26   1.00   1.02    ?Sodium 135 - 145 mmol/L 135   136     ?Potassium 3.5 - 5.1 mmol/L 4.7   4.0     ?Chloride 98 - 111 mmol/L 102   104     ?CO2 22 - 32 mmol/L 25   25     ?Calcium 8.9 - 10.3 mg/dL 8.7   8.3     ?Total Protein 6.5 - 8.1 g/dL 6.9      ?Total Bilirubin 0.3 - 1.2 mg/dL 0.7      ?Alkaline Phos 38 - 126 U/L 73      ?AST 15 - 41 U/L 25      ?ALT 0 - 44 U/L 20      ? ? ?Lab Results  ?Component Value Date  ? WBC 7.3 09/14/2021  ? HGB 12.0 09/14/2021  ? HCT 37.2 09/14/2021  ? MCV 90.5 09/14/2021  ? PLT 211 09/14/2021  ? NEUTROABS 3.9 09/14/2021  ? ? ?ASSESSMENT & PLAN:   ?Antiphospholipid antibody syndrome ([place]) ?History of antiphospholipid antibody syndrome with prior history of thromboembolic disease involving her fingers (1996) ?Counseling: I discussed with the patient the mechanism of hypercoagulability related to antiphospholipid antibodies. ?Recommendation: Anticoagulation for life with Coumadin. ?  ?Patient would like [name] to manage her Coumadin because it has been fluctuating quite significantly.  Part of the problem is that she has missed a few doses intermittently and her diet also changes. ?Currently she is taking 7.5 mg 5 days a week and 5 mg 2 days a week. ? ?Lab review: ?11/09/2021: INR 2.9 ?11/25/2021: INR: 2.6 ?Goal INR: 2.5-3.5 ?  ?Continue with the current dosage ?  ?Prior history of breast cancer: 23 years ago and in remission ?Return to clinic for labs for INR checks and I will see her back in 1 year. ? ? ? ?No orders of the defined types were placed in this encounter. ? ?The patient has a good understanding of the overall plan. she agrees with it. she will call with any problems that may develop before the next visit here. ?Total time spent: 30 mins including face to face time and time spent for planning, charting and co-ordination of care ? ? Harriette Ohara, MD ?11/25/21 ? ? ? I Gardiner Coins am scribing for Dr. Lindi Adie ? ?I have reviewed the above documentation for accuracy and completeness, and I agree with the above. ?  ?

## 2021-11-12 ENCOUNTER — Telehealth: Payer: Self-pay | Admitting: Gastroenterology

## 2021-11-12 ENCOUNTER — Other Ambulatory Visit: Payer: Self-pay

## 2021-11-12 MED ORDER — PLENVU 140 G PO SOLR: 1.0000 | ORAL | 0 refills | Status: DC

## 2021-11-12 NOTE — Telephone Encounter (Signed)
Patient called, states Plenvu was not received by pharmacy. Patient has scheduled procedures 11/17/21. Please advise. ?

## 2021-11-12 NOTE — Telephone Encounter (Signed)
Returned call to patient regarding Plenvu being sent to pharmacy.  Patient stated that her sister went to the wrong pharmacy.  Once her sister realized she was at the wrong pharmacy she went to the correct pharmacy, and the cost of Plenvu was too expensive.   ? ?I explained to patient that we would change prep to Miralax.  New instructions sent to patient in MyChart per her request.  Patient agreed to plan and verbalized understanding.  No further questions.  ?

## 2021-11-16 ENCOUNTER — Telehealth: Payer: Self-pay | Admitting: *Deleted

## 2021-11-16 ENCOUNTER — Telehealth: Payer: Self-pay | Admitting: Gastroenterology

## 2021-11-16 NOTE — Telephone Encounter (Signed)
Called the patient back as she was wanting to make sure she could still have her colonoscopy tomorrow? She had a lumpectomy in February and is still having a little drainage from the incision. Her surgeon is aware of this and not concerned. No fever, redness or other signs of infection. Spoke with Dr. Loletha Carrow and he said she should be fine to go ahead with the procedure.  ?

## 2021-11-16 NOTE — Telephone Encounter (Signed)
Inbound call from patient stating that she is scheduled to have a procedure with Dr. Ardis Hughs tomorrow at 1:30. Patient stated that she had a lumpectomy back in February and has a opening in her breast that still drains. Patient is seeking advice if she is still okay to come in for her procedure. Please advise.   ?

## 2021-11-17 ENCOUNTER — Ambulatory Visit (AMBULATORY_SURGERY_CENTER): Payer: Medicare HMO | Admitting: Gastroenterology

## 2021-11-17 ENCOUNTER — Encounter: Payer: Self-pay | Admitting: Gastroenterology

## 2021-11-17 VITALS — BP 112/61 | HR 58 | Temp 98.9°F | Resp 20 | Ht 65.0 in | Wt 185.0 lb

## 2021-11-17 DIAGNOSIS — R1310 Dysphagia, unspecified: Secondary | ICD-10-CM

## 2021-11-17 DIAGNOSIS — K2970 Gastritis, unspecified, without bleeding: Secondary | ICD-10-CM | POA: Diagnosis not present

## 2021-11-17 DIAGNOSIS — Z1211 Encounter for screening for malignant neoplasm of colon: Secondary | ICD-10-CM | POA: Diagnosis not present

## 2021-11-17 DIAGNOSIS — K2950 Unspecified chronic gastritis without bleeding: Secondary | ICD-10-CM | POA: Diagnosis not present

## 2021-11-17 DIAGNOSIS — Z800 Family history of malignant neoplasm of digestive organs: Secondary | ICD-10-CM | POA: Diagnosis not present

## 2021-11-17 MED ORDER — SODIUM CHLORIDE 0.9 % IV SOLN: 500.0000 mL | INTRAVENOUS | Status: DC

## 2021-11-17 NOTE — Op Note (Signed)
[facility] ?Patient Name: [name] ?Procedure Date: 11/17/2021 12:11 PM ?MRN: [id number] ?Endoscopist: Milus Banister , MD ?Age: 76 ?Referring MD:  ?Date of Birth: 12/28/1945 ?Gender: Female ?Account #: [id number] ?Procedure:                Upper GI endoscopy ?Indications:              Dysphagia ?Medicines:                Monitored Anesthesia Care ?Procedure:                Pre-Anesthesia Assessment: ?                          - Prior to the procedure, a History and Physical  ?                          was performed, and patient medications and  ?                          allergies were reviewed. The patient's tolerance of  ?                          previous anesthesia was also reviewed. The risks  ?                          and benefits of the procedure and the sedation  ?                          options and risks were discussed with the patient.  ?                          All questions were answered, and informed consent  ?                          was obtained. Prior Anticoagulants: The patient has  ?                          taken Coumadin (warfarin), last dose was 3 days  ?                          prior to procedure. ASA Grade Assessment: III - A  ?                          patient with severe systemic disease. After  ?                          reviewing the risks and benefits, the patient was  ?                          deemed in satisfactory condition to undergo the  ?                          procedure. ?  After obtaining informed consent, the endoscope was  ?                          passed under direct vision. Throughout the  ?                          procedure, the patient's blood pressure, pulse, and  ?                          oxygen saturations were monitored continuously. The  ?                          Endoscope was introduced through the mouth, and  ?                          advanced to the second part of duodenum. The upper  ?                           GI endoscopy was accomplished without difficulty.  ?                          The patient tolerated the procedure well. ?Scope In: ?Scope Out: ?Findings:                 Evidence of previous fundoplication noted. No  ?                          stricture or stenosis. ?                          Mild inflammation characterized by erythema and  ?                          friability was found in the gastric antrum.  ?                          Biopsies were taken with a cold forceps for  ?                          histology. jar 1 ?                          The mucosa in the proximal stomach was  ?                          erythematous, somewhat nodular, friable. This was  ?                          patchy. I sampled the cardia, fundus with forceps.  ?                          jar 2 ?                          The exam was otherwise without abnormality. ?Complications:  No immediate complications. Estimated blood loss:  ?                          None. ?Estimated Blood Loss:     Estimated blood loss: none. ?Impression:               - Evidence of previous fundoplication noted. No  ?                          stricture or stenosis. ?                          - Mild inflammation characterized by erythema and  ?                          friability was found in the gastric antrum.  ?                          Biopsies were taken with a cold forceps for  ?                          histology. jar 1 ?                          - The mucosa in the proximal stomach was  ?                          erythematous, somewhat nodular, friable. This was  ?                          patchy. I sampled the cardia, fundus with forceps.  ?                          jar 2 ?                          - The examination was otherwise normal. ?Recommendation:           - Patient has a contact number available for  ?                          emergencies. The signs and symptoms of potential  ?                          delayed complications were  discussed with the  ?                          patient. Return to normal activities tomorrow.  ?                          Written discharge instructions were provided to the  ?                          patient. ?                          -  Resume previous diet. ?                          - Continue present medications. You can resume your  ?                          coumadin today. ?                          - Await pathology results. ?Milus Banister, MD ?11/17/2021 1:59:44 PM ?This report has been signed electronically. ?

## 2021-11-17 NOTE — Progress Notes (Signed)
HPI: ?This is a woman with FH of CRC (sister) and dysphagia ? ? ? ? ? ?ROS: complete GI ROS as described in HPI, all other review negative. ? ?Constitutional:  No unintentional weight loss ? ? ?Past Medical History:  ?Diagnosis Date  ? Anxiety   ? severe panic attacks  ? Arthritis   ? Aseptic meningitis   ? Asthma   ? as child  ? Breast cancer ([place])   ? right  ? Chronic anticoagulation 08/04/2015  ? Collapsed lung   ? hx of, left; following left thoracentesis for post-operative pleural effusion > 20 years ago  ? Complication of anesthesia   ? "hard to put asleep"; awareness under anesthesia > 35 years ago for hysterectomy  ? Depression   ? Fibromyalgia   ? GERD (gastroesophageal reflux disease)   ? H/O hiatal hernia   ? Headache(784.0)   ? Hepatitis 1990  ? "from eating at restaurant"  ? History of antiphospholipid antibody syndrome   ? History of DVT (deep vein thrombosis)   ? in all fingers  ? Hx-TIA (transient ischemic attack)   ? Hyperlipidemia   ? Hypertension   ? Hypothyroidism   ? Neuromuscular disorder ([place])   ? Personal history of radiation therapy   ? Pneumonia   ? hx of  ? Soft tissue mass 06/14/2017  ? 3 cm right post axilla same side as previous breast cancer 06/14/17  ? Stroke [facility])   ? 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 Left digital artery  October 1998 - new dx antiphospholipid antibody syndrome  ? ? ?Past Surgical History:  ?Procedure Laterality Date  ? ABDOMINAL HYSTERECTOMY    ? BREAST LUMPECTOMY Right 2000  ? BREAST LUMPECTOMY Right 09/15/2021  ? Procedure: RIGHT BREAST LUMPECTOMY;  Surgeon: Erroll Luna, MD;  Location: [facility];  Service: General;  Laterality: Right;  ? BREAST SURGERY Right   ? x3  ? BUNIONECTOMY Bilateral 30 years ago  ? CARDIAC CATHETERIZATION  07/12/1997  ? NORMAL LEFT VENTRICULAR FUNCTION WITH EF AT LEAST 70-75%  ? ENDARTERECTOMY Right 04/23/2021  ? Procedure: RIGHT CAROTID ENDARTERECTOMY;  Surgeon: Waynetta Sandy, MD;   Location: [place];  Service: Vascular;  Laterality: Right;  ? ESOPHAGEAL MANOMETRY N/A 11/06/2012  ? Procedure: ESOPHAGEAL MANOMETRY (EM);  Surgeon: Pedro Earls, MD;  Location: [facility];  Service: General;  Laterality: N/A;  ? ESOPHAGOGASTRODUODENOSCOPY ENDOSCOPY    ? KNEE ARTHROSCOPY Right   ? LAPAROSCOPIC NISSEN FUNDOPLICATION N/A 1/76/1607  ? Procedure: LAPAROSCOPIC TAKEDOWN  PERI-HIATAL HERNIA    REPAIR;  Surgeon: Pedro Earls, MD;  Location: [facility];  Service: General;  Laterality: N/A;  ? LEFT OOPHORECTOMY  1982  ? PATCH ANGIOPLASTY Right 04/23/2021  ? Procedure: PATCH ANGIOPLASTY;  Surgeon: Waynetta Sandy, MD;  Location: [facility];  Service: Vascular;  Laterality: Right;  ? [place]  ? THYROIDECTOMY  in 20's  ? TONSILLECTOMY  76 years old  ? UPPER GI ENDOSCOPY N/A 12/12/2012  ? Procedure: UPPER GI ENDOSCOPY;  Surgeon: Pedro Earls, MD;  Location: [facility];  Service: General;  Laterality: N/A;  ? ? ?Current Outpatient Medications  ?Medication Sig Dispense Refill  ? ALPRAZolam (XANAX) 1 MG tablet Take 1 mg by mouth 2 (two) times daily.     ? aspirin 81 MG tablet Take 81 mg by mouth every evening.    ? clindamycin (CLEOCIN) 300 MG capsule Take 300 mg by mouth 2 (two) times daily.    ?  levothyroxine (SYNTHROID, LEVOTHROID) 112 MCG tablet Take 112 mcg by mouth See admin instructions. Take 112 mcg by mouth daily on Sun, Tues, Wed, Thurs, Sat    ? levothyroxine (SYNTHROID, LEVOTHROID) 125 MCG tablet Take 125 mcg by mouth See admin instructions. Take 125 mcg by mouth on Monday and Friday    ? metoprolol tartrate (LOPRESSOR) 25 MG tablet Take 12.5 mg by mouth 2 (two) times daily.    ? omeprazole (PRILOSEC) 20 MG capsule Take 20 mg by mouth daily.     ? pravastatin (PRAVACHOL) 80 MG tablet Take 80 mg by mouth daily.    ? sertraline (ZOLOFT) 100 MG tablet Take 100 mg by mouth daily.    ? Ascorbic Acid (VITAMIN C) 1000 MG tablet Take 1,000 mg by mouth daily.    ? Calcium  Carb-Cholecalciferol (CALCIUM-VITAMIN D) 600-400 MG-UNIT TABS Take 1 tablet by mouth daily.    ? Cyanocobalamin (VITAMIN B 12) 500 MCG TABS Take 500 mcg by mouth daily.    ? fluticasone (FLONASE) 50 MCG/ACT nasal spray Place 2 sprays into the nose daily as needed for allergies.     ? HYDROcodone-acetaminophen (NORCO/VICODIN) 5-325 MG tablet Take 1 tablet by mouth every 6 (six) hours as needed for moderate pain. 15 tablet 0  ? magnesium oxide (MAG-OX) 400 MG tablet Take 400 mg by mouth daily.    ? warfarin (COUMADIN) 5 MG tablet Take 5 mg daily except on Mondays: take 2.5 mg (1/2 tablet) 90 tablet 3  ? ?Current Facility-Administered Medications  ?Medication Dose Route Frequency Provider Last Rate Last Admin  ? 0.9 %  sodium chloride infusion  500 mL Intravenous Continuous Milus Banister, MD      ? ? ?Allergies as of 11/17/2021 - Review Complete 11/17/2021  ?Allergen Reaction Noted  ? Bee venom Anaphylaxis 02/23/2011  ? Hornet venom Anaphylaxis 05/04/2011  ? Reglan [metoclopramide] Shortness Of Breath and Other (See Comments) 04/17/2013  ? Vitamin k Anaphylaxis and Rash 10/01/2010  ? Baclofen  01/21/2020  ? Cephalexin Other (See Comments) 02/06/2020  ? Contrast media [iodinated contrast media] Other (See Comments) 09/22/2015  ? Benadryl [diphenhydramine hcl] Rash 07/22/2011  ? Ciprofloxacin Rash 02/17/2016  ? Crestor [rosuvastatin calcium] Other (See Comments) 07/22/2011  ? Cymbalta [duloxetine hcl] Swelling 08/01/2017  ? Daypro [oxaprozin] Rash 07/22/2011  ? Doxycycline Other (See Comments) 12/06/2012  ? Levofloxacin Rash 07/22/2011  ? Sulfonamide derivatives Rash 10/01/2010  ? Zocor [simvastatin] Other (See Comments) 07/22/2011  ? ? ?Family History  ?Problem Relation Age of Onset  ? Alzheimer's disease Mother   ? Heart disease Mother   ? Hyperlipidemia Mother   ? Hypertension Mother   ? Colon cancer Sister   ? Heart disease Sister   ? Hypertension Sister   ? Heart attack Sister   ? Colon polyps Brother   ? Heart  disease Brother   ? Heart attack Brother   ? Colon cancer Maternal Grandmother   ? Cancer Maternal Grandmother   ?     colon  ? Cancer Daughter   ? Hypertension Daughter   ? Hypertension Son   ? ? ?Social History  ? ?Socioeconomic History  ? Marital status: Single  ?  Spouse name: Not on file  ? Number of children: 2  ? Years of education: 49  ? Highest education level: Not on file  ?Occupational History  ? Not on file  ?Tobacco Use  ? Smoking status: Never  ? Smokeless tobacco: Never  ?Vaping Use  ?  Vaping Use: Never used  ?Substance and Sexual Activity  ? Alcohol use: No  ?  Alcohol/week: 0.0 standard drinks  ? Drug use: No  ? Sexual activity: Never  ?Other Topics Concern  ? Not on file  ?Social History Narrative  ? Lives at home with her mother.  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  ? Right-handed.  ? 2-4 cups caffeine daily.  ? ?Social Determinants of Health  ? ?Financial Resource Strain: Not on file  ?Food Insecurity: Not on file  ?Transportation Needs: Not on file  ?Physical Activity: Not on file  ?Stress: Not on file  ?Social Connections: Not on file  ?Intimate Partner Violence: Not on file  ? ? ? ?Physical Exam: ?BP 137/84   Pulse 68   Temp 98.9 ?F (37.2 ?C) (Temporal)   Ht '5\' 5"'$  (1.651 m)   Wt 185 lb (83.9 kg)   SpO2 97%   BMI 30.79 kg/m?  ?Constitutional: generally well-appearing ?Psychiatric: alert and oriented x3 ?Lungs: CTA bilaterally ?Heart: no MCR ? ?Assessment and plan: ?76 y.o. female with FH CRC, dysphagia ? ?Colonoscopy and egd today ?She's held coumadin for 3 days ? ?Care is appropriate for the ambulatory setting. ? ?Owens Loffler, MD ?[facility] ?11/17/2021, 1:07 PM ? ? ? ?

## 2021-11-17 NOTE — Patient Instructions (Signed)
Biopsies taken from stomach- await pathology ? ?You may resume your Coumadin today; continue other medications as well ? ?No polyps- next colonoscopy 5 years ? ? ? ?YOU HAD AN ENDOSCOPIC PROCEDURE TODAY AT [facility]:   Refer to the procedure report that was given to you for any specific questions about what was found during the examination.  If the procedure report does not answer your questions, please call your gastroenterologist to clarify.  If you requested that your care partner not be given the details of your procedure findings, then the procedure report has been included in a sealed envelope for you to review at your convenience later. ? ?YOU SHOULD EXPECT: Some feelings of bloating in the abdomen. Passage of more gas than usual.  Walking can help get rid of the air that was put into your GI tract during the procedure and reduce the bloating. If you had a lower endoscopy (such as a colonoscopy or flexible sigmoidoscopy) you may notice spotting of blood in your stool or on the toilet paper. If you underwent a bowel prep for your procedure, you may not have a normal bowel movement for a few days. ? ?Please Note:  You might notice some irritation and congestion in your nose or some drainage.  This is from the oxygen used during your procedure.  There is no need for concern and it should clear up in a day or so. ? ?SYMPTOMS TO REPORT IMMEDIATELY: ? ?Following lower endoscopy (colonoscopy or flexible sigmoidoscopy): ? Excessive amounts of blood in the stool ? Significant tenderness or worsening of abdominal pains ? Swelling of the abdomen that is new, acute ? Fever of 100?F or higher ? ?Following upper endoscopy (EGD) ? Vomiting of blood or coffee ground material ? New chest pain or pain under the shoulder blades ? Painful or persistently difficult swallowing ? New shortness of breath ? Fever of 100?F or higher ? Black, tarry-looking stools ? ?For urgent or emergent issues, a gastroenterologist  can be reached at any hour by calling [phone]. ?Do not use MyChart messaging for urgent concerns.  ? ? ?DIET:  We do recommend a small meal at first, but then you may proceed to your regular diet.  Drink plenty of fluids but you should avoid alcoholic beverages for 24 hours. ? ?ACTIVITY:  You should plan to take it easy for the rest of today and you should NOT DRIVE or use heavy machinery until tomorrow (because of the sedation medicines used during the test).   ? ?FOLLOW UP: ?Our staff will call the number listed on your records 48-72 hours following your procedure to check on you and address any questions or concerns that you may have regarding the information given to you following your procedure. If we do not reach you, we will leave a message.  We will attempt to reach you two times.  During this call, we will ask if you have developed any symptoms of COVID 19. If you develop any symptoms (ie: fever, flu-like symptoms, shortness of breath, cough etc.) before then, please call [phone].  If you test positive for Covid 19 in the 2 weeks post procedure, please call and report this information to [name].   ? ?If any biopsies were taken you will be contacted by phone or by letter within the next 1-3 weeks.  Please call us at [phone] if you have not heard about the biopsies in 3 weeks.  ? ? ?SIGNATURES/CONFIDENTIALITY: ?You and/or your care  partner have signed paperwork which will be entered into your electronic medical record.  These signatures attest to the fact that that the information above on your After Visit Summary has been reviewed and is understood.  Full responsibility of the confidentiality of this discharge information lies with you and/or your care-partner. ? ?

## 2021-11-17 NOTE — Op Note (Signed)
[facility] ?Patient Name: [name] ?Procedure Date: 11/17/2021 12:11 PM ?MRN: [id number] ?Endoscopist: Milus Banister , MD ?Age: 76 ?Referring MD:  ?Date of Birth: 12-30-1945 ?Gender: Female ?Account #: [id number] ?Procedure:                Colonoscopy ?Indications:              Screening in patient at increased risk: Family  ?                          history of 1st-degree relative with colorectal  ?                          cancer (sister) ?Medicines:                Monitored Anesthesia Care ?Procedure:                Pre-Anesthesia Assessment: ?                          - Prior to the procedure, a History and Physical  ?                          was performed, and patient medications and  ?                          allergies were reviewed. The patient's tolerance of  ?                          previous anesthesia was also reviewed. The risks  ?                          and benefits of the procedure and the sedation  ?                          options and risks were discussed with the patient.  ?                          All questions were answered, and informed consent  ?                          was obtained. Prior Anticoagulants: The patient has  ?                          taken Coumadin (warfarin), last dose was 3 days  ?                          prior to procedure. ASA Grade Assessment: III - A  ?                          patient with severe systemic disease. After  ?                          reviewing the risks and benefits, the patient was  ?  deemed in satisfactory condition to undergo the  ?                          procedure. ?                          After obtaining informed consent, the colonoscope  ?                          was passed under direct vision. Throughout the  ?                          procedure, the patient's blood pressure, pulse, and  ?                          oxygen saturations were monitored continuously. The  ?                          CF  [id number] #[id number] was introduced through the anus  ?                          and advanced to the the cecum, identified by  ?                          appendiceal orifice and ileocecal valve. The  ?                          colonoscopy was performed without difficulty. The  ?                          patient tolerated the procedure well. The quality  ?                          of the bowel preparation was good. The ileocecal  ?                          valve, appendiceal orifice, and rectum were  ?                          photographed. ?Scope In: 1:27:00 PM ?Scope Out: 1:40:31 PM ?Scope Withdrawal Time: 0 hours 9 minutes 42 seconds  ?Total Procedure Duration: 0 hours 13 minutes 31 seconds  ?Findings:                 The entire examined colon appeared normal on direct  ?                          and retroflexion views. ?Complications:            No immediate complications. Estimated blood loss:  ?                          None. ?Estimated Blood Loss:     Estimated blood loss: none. ?Impression:               - The entire examined colon is normal on direct and  ?  retroflexion views. ?                          - No polyps or cancers. ?Recommendation:           - EGD now. ?                          - Recall colonoscopy in 5 years. ?Milus Banister, MD ?11/17/2021 1:42:50 PM ?This report has been signed electronically. ?

## 2021-11-17 NOTE — Progress Notes (Signed)
Called to room to assist during endoscopic procedure.  Patient ID and intended procedure confirmed with present staff. Received instructions for my participation in the procedure from the performing physician.  

## 2021-11-17 NOTE — Progress Notes (Signed)
Pt in recovery with monitors in place, VSS. Report given to receiving RN. Bite guard was placed with pt awake to ensure comfort. No dental or soft tissue damage noted. 

## 2021-11-17 NOTE — Progress Notes (Signed)
Pt's states no medical or surgical changes since previsit or office visit. 

## 2021-11-19 ENCOUNTER — Telehealth: Payer: Self-pay

## 2021-11-19 NOTE — Telephone Encounter (Signed)
?  Follow up Call- ? ? ?  11/17/2021  ? 12:53 PM  ?Call back number  ?Post procedure Call Back phone  # [phone]  ?Permission to leave phone message Yes  ?  ? ?Patient questions: ? ?Do you have a fever, pain , or abdominal swelling? No. ?Pain Score  0 * ? ?Have you tolerated food without any problems? Yes.   ? ?Have you been able to return to your normal activities? Yes.   ? ?Do you have any questions about your discharge instructions: ?Diet   No. ?Medications  No. ?Follow up visit  No. ? ?Do you have questions or concerns about your Care? No. ? ?Actions: ?* If pain score is 4 or above: ?No action needed, pain <4. ? ? ?

## 2021-11-24 ENCOUNTER — Encounter: Payer: Self-pay | Admitting: Gastroenterology

## 2021-11-25 ENCOUNTER — Inpatient Hospital Stay: Payer: Medicare HMO | Attending: Hematology and Oncology | Admitting: Hematology and Oncology

## 2021-11-25 ENCOUNTER — Other Ambulatory Visit: Payer: Self-pay

## 2021-11-25 ENCOUNTER — Inpatient Hospital Stay: Payer: Medicare HMO

## 2021-11-25 DIAGNOSIS — Z79899 Other long term (current) drug therapy: Secondary | ICD-10-CM | POA: Insufficient documentation

## 2021-11-25 DIAGNOSIS — Z86718 Personal history of other venous thrombosis and embolism: Secondary | ICD-10-CM | POA: Insufficient documentation

## 2021-11-25 DIAGNOSIS — Z862 Personal history of diseases of the blood and blood-forming organs and certain disorders involving the immune mechanism: Secondary | ICD-10-CM | POA: Diagnosis present

## 2021-11-25 DIAGNOSIS — D6861 Antiphospholipid syndrome: Secondary | ICD-10-CM

## 2021-11-25 LAB — PROTIME-INR
INR: 2.6 — ABNORMAL HIGH (ref 0.8–1.2)
Prothrombin Time: 27.6 seconds — ABNORMAL HIGH (ref 11.4–15.2)

## 2021-11-25 NOTE — Assessment & Plan Note (Signed)
History of antiphospholipid antibody syndrome with prior history of thromboembolic disease involving her fingers (1996) ?Counseling: I discussed with the patient the mechanism of hypercoagulability related to antiphospholipid antibodies. ?Recommendation: Anticoagulation for life with Coumadin. ?? ?Patient would like [name] to manage her Coumadin because it has been fluctuating quite significantly.  Part of the problem is that she has missed a few doses intermittently and her diet also changes. ?Currently she is taking 7.5 mg 5 days a week and 5 mg 2 days a week. ? ?Lab review: ?11/09/2021: INR 2.9 ? ?? ?We will decide on the frequency of blood test based upon the labs that we get today. ?Goal INR: 2.5-3.5 ?Colonoscopy:.  Dr. Ardis Hughs plans to do colonoscopy. ?Because of her prior history of rapid decline in the INR once she stops the medication, we felt that it would be safe to do the colonoscopy after holding Coumadin for 3 days.  She will resume Coumadin day after colonoscopy. ?? ?? ?Prior history of breast cancer: 23 years ago and in remission ?Return to clinic for labs for INR checks and I will see her back in 1 year. ?

## 2021-11-26 ENCOUNTER — Inpatient Hospital Stay: Payer: Medicare HMO

## 2021-11-26 ENCOUNTER — Telehealth: Payer: Self-pay | Admitting: Hematology and Oncology

## 2021-11-26 ENCOUNTER — Other Ambulatory Visit: Payer: Medicare HMO

## 2021-11-26 NOTE — Telephone Encounter (Signed)
Per 5/3 in basket called and spoke to pt about adding monthly labs  pr confirmed appointments  ?

## 2021-12-28 ENCOUNTER — Other Ambulatory Visit: Payer: Medicare HMO

## 2021-12-28 ENCOUNTER — Inpatient Hospital Stay: Payer: Medicare HMO | Attending: Hematology and Oncology

## 2021-12-28 ENCOUNTER — Other Ambulatory Visit: Payer: Self-pay

## 2021-12-28 DIAGNOSIS — D6861 Antiphospholipid syndrome: Secondary | ICD-10-CM | POA: Diagnosis present

## 2021-12-28 DIAGNOSIS — Z86718 Personal history of other venous thrombosis and embolism: Secondary | ICD-10-CM | POA: Insufficient documentation

## 2021-12-28 DIAGNOSIS — Z862 Personal history of diseases of the blood and blood-forming organs and certain disorders involving the immune mechanism: Secondary | ICD-10-CM

## 2021-12-28 LAB — PROTIME-INR
INR: 2.2 — ABNORMAL HIGH (ref 0.8–1.2)
Prothrombin Time: 24.6 seconds — ABNORMAL HIGH (ref 11.4–15.2)

## 2021-12-29 ENCOUNTER — Other Ambulatory Visit: Payer: Self-pay | Admitting: *Deleted

## 2021-12-29 ENCOUNTER — Telehealth: Payer: Self-pay | Admitting: *Deleted

## 2021-12-29 DIAGNOSIS — D6861 Antiphospholipid syndrome: Secondary | ICD-10-CM

## 2021-12-29 DIAGNOSIS — I742 Embolism and thrombosis of arteries of the upper extremities: Secondary | ICD-10-CM

## 2021-12-29 MED ORDER — WARFARIN SODIUM 5 MG PO TABS: ORAL_TABLET | ORAL | 3 refills | Status: AC

## 2021-12-29 NOTE — Telephone Encounter (Signed)
Received VM from pt to review recent INR.  Pt INR 2.2.  RN reviewed with NP and verbal orders received for pt to increase Warfarin 7.5 mg 7 days a week.  RN attempt x1 to contact pt, no answer.  LVM with above information and informed pt to contact the office with any additional questions.

## 2021-12-30 ENCOUNTER — Telehealth: Payer: Self-pay

## 2021-12-30 NOTE — Telephone Encounter (Signed)
Pt called and LVM stating she has been awaiting a call regarding her warfarin for 3 days. Attempted to return pt's call X2 and previously documented, Heinz Knuckles, RN attempted to call pt X1 and LVM with dose instructions. Pt did not answer once again, LVM for pt to return call. Also tried to call pt's primary contact, Danny Moorefield to give him instructions. LVM for Danny to call us back or have his mother call us back for instructions on med increase.

## 2021-12-30 NOTE — Telephone Encounter (Signed)
Pt's son called back and was given instructions; he verbalized understanding with readback and states he will call the pt to make her aware. This nurse will also send a mychart message to pt to insure she receives proper instructions.

## 2022-01-20 ENCOUNTER — Telehealth: Payer: Self-pay | Admitting: Hematology and Oncology

## 2022-01-20 NOTE — Telephone Encounter (Signed)
Called pt to cancel per 6/27 afterhours message, left msg with pt to call back to cancel upcoming appt, no details left with message to cancel

## 2022-01-27 ENCOUNTER — Encounter (HOSPITAL_BASED_OUTPATIENT_CLINIC_OR_DEPARTMENT_OTHER): Payer: Medicare HMO | Attending: Internal Medicine | Admitting: Physician Assistant

## 2022-01-27 ENCOUNTER — Inpatient Hospital Stay: Payer: Medicare HMO | Attending: Hematology and Oncology

## 2022-01-27 ENCOUNTER — Other Ambulatory Visit: Payer: Medicare HMO

## 2022-01-27 DIAGNOSIS — D6861 Antiphospholipid syndrome: Secondary | ICD-10-CM | POA: Insufficient documentation

## 2022-01-27 DIAGNOSIS — G629 Polyneuropathy, unspecified: Secondary | ICD-10-CM | POA: Insufficient documentation

## 2022-01-27 DIAGNOSIS — L98492 Non-pressure chronic ulcer of skin of other sites with fat layer exposed: Secondary | ICD-10-CM | POA: Insufficient documentation

## 2022-01-27 DIAGNOSIS — I2510 Atherosclerotic heart disease of native coronary artery without angina pectoris: Secondary | ICD-10-CM | POA: Diagnosis not present

## 2022-01-27 NOTE — Progress Notes (Signed)
[name], [name] ([id number]) Visit Report for 01/27/2022 Abuse Risk Screen Details Patient Name: Date of Service: [name], [name] 01/27/2022 8:00 A M Medical Record Number: [id number] Patient Account Number: [id number] Date of Birth/Sex: Treating RN: 09-01-1945 (76 y.o. [name], [name] Primary Care Armina Galloway: [name] Other Clinician: Referring Marget Outten: Treating Phyillis Dascoli/Extender: [name] in Treatment: 0 Abuse Risk Screen Items Answer ABUSE RISK SCREEN: Has anyone close to you tried to hurt or harm you recentlyo No Do you feel uncomfortable with anyone in your familyo No Has anyone forced you do things that you didnt want to doo No Electronic Signature(s) Signed: 01/27/2022 4:25:42 PM By: Rhae Hammock RN Entered By: Rhae Hammock on 01/27/2022 08:32:27 -------------------------------------------------------------------------------- Activities of Daily Living Details Patient Name: Date of Service: [name], [name] 01/27/2022 8:00 A M Medical Record Number: [id number] Patient Account Number: [id number] Date of Birth/Sex: Treating RN: 06-29-1946 (76 y.o. [name], [name] Primary Care Dariona Postma: [name] Other Clinician: Referring Sebasthian Stailey: Treating Jarret Torre/Extender: Renae Fickle Weeks in Treatment: 0 Activities of Daily Living Items Answer Activities of Daily Living (Please select one for each item) Drive Automobile Completely Able T Medications ake Completely Able Use T elephone Completely Able Care for Appearance Completely Able Use T oilet Completely Able Bath / Shower Completely Able Dress Self Completely Able Feed Self Completely Able Walk Completely Able Get In / Out Bed Completely Able Housework Completely Able Prepare Meals Completely [place] for Self Completely Able Electronic Signature(s) Signed: 01/27/2022 4:25:42 PM By: Rhae Hammock RN Entered  By: Rhae Hammock on 01/27/2022 08:32:50 -------------------------------------------------------------------------------- Education Screening Details Patient Name: Date of Service: [name] 01/27/2022 8:00 A M Medical Record Number: [id number] Patient Account Number: [id number] Date of Birth/Sex: Treating RN: Jul 16, 1946 (76 y.o. [name], [name] Primary Care Dianne Bady: [name] Other Clinician: Referring Laylani Pudwill: Treating Jeffery Gammell/Extender: [name] in Treatment: 0 Primary Learner Assessed: Patient Learning Preferences/Education Level/Primary Language Learning Preference: Explanation, Demonstration, Communication Board, Printed Material Highest Education Level: High School Preferred Language: English Cognitive Barrier Language Barrier: No Translator Needed: No Memory Deficit: No Emotional Barrier: No Cultural/Religious Beliefs Affecting Medical Care: No Physical Barrier Impaired Vision: No Impaired Hearing: No Decreased Hand dexterity: No Knowledge/Comprehension Knowledge Level: High Comprehension Level: High Ability to understand written instructions: High Ability to understand verbal instructions: High Motivation Anxiety Level: Calm Cooperation: Cooperative Education Importance: Denies Need Interest in Health Problems: Asks Questions Perception: Coherent Willingness to Engage in Self-Management High Activities: Readiness to Engage in Self-Management High Activities: Electronic Signature(s) Signed: 01/27/2022 4:25:42 PM By: Rhae Hammock RN Entered By: Rhae Hammock on 01/27/2022 08:33:10 -------------------------------------------------------------------------------- Fall Risk Assessment Details Patient Name: Date of Service: [name]. 01/27/2022 8:00 A M Medical Record Number: [id number] Patient Account Number: [id number] Date of Birth/Sex: Treating RN: 03-21-1946 (75 y.o. [name],  [name] Primary Care Tanae Petrosky: [name] Other Clinician: Referring Dhanvin Szeto: Treating Leighann Amadon/Extender: [name] in Treatment: 0 Fall Risk Assessment Items Have you had 2 or more falls in the last 12 monthso 0 Yes Have you had any fall that resulted in injury in the last 12 monthso 0 Yes FALLS RISK SCREEN History of falling - immediate or within 3 months 25 Yes Secondary diagnosis (Do you have 2 or more medical diagnoseso) 0 No Ambulatory aid None/bed rest/wheelchair/nurse 0 No Crutches/cane/walker 0 No Furniture 0 No Intravenous therapy Access/Saline/Heparin Lock 0 No Gait/Transferring Normal/ bed rest/ wheelchair 0 No Weak (  short steps with or without shuffle, stooped but able to lift head while walking, may seek 0 No support from furniture) Impaired (short steps with shuffle, may have difficulty arising from chair, head down, impaired 0 No balance) Mental Status Oriented to own ability 0 No Electronic Signature(s) Signed: 01/27/2022 4:25:42 PM By: Rhae Hammock RN Entered By: Rhae Hammock on 01/27/2022 08:33:28 -------------------------------------------------------------------------------- Foot Assessment Details Patient Name: Date of Service: [name]. 01/27/2022 8:00 A M Medical Record Number: [id number] Patient Account Number: [id number] Date of Birth/Sex: Treating RN: 08/04/1945 (76 y.o. [name], [name] Primary Care Cassia Fein: [name] Other Clinician: Referring Keaton Beichner: Treating Shatonya Passon/Extender: Renae Fickle Weeks in Treatment: 0 Foot Assessment Items Site Locations + = Sensation present, - = Sensation absent, C = Callus, U = Ulcer R = Redness, W = Warmth, M = Maceration, PU = Pre-ulcerative lesion F = Fissure, S = Swelling, D = Dryness Assessment Right: Left: Other Deformity: No No Prior Foot Ulcer: No No Prior Amputation: No No Charcot Joint: No No Ambulatory  Status: Gait: Notes N/A no LE wounds Electronic Signature(s) Signed: 01/27/2022 4:25:42 PM By: Rhae Hammock RN Entered By: Rhae Hammock on 01/27/2022 08:33:42 -------------------------------------------------------------------------------- Nutrition Risk Screening Details Patient Name: Date of Service: [name] 01/27/2022 8:00 A M Medical Record Number: [id number] Patient Account Number: [id number] Date of Birth/Sex: Treating RN: [birth year] (76 y.o. [name], [name] Primary Care Deryn Massengale: [name] Other Clinician: Referring Harlene Petralia: Treating Quientin Jent/Extender: Renae Fickle Weeks in Treatment: 0 Height (in): 64 Weight (lbs): 180 Body Mass Index (BMI): 30.9 Nutrition Risk Screening Items Score Screening NUTRITION RISK SCREEN: I have an illness or condition that made me change the kind and/or amount of food I eat 0 No I eat fewer than two meals per day 0 No I eat few fruits and vegetables, or milk products 0 No I have three or more drinks of beer, liquor or wine almost every day 0 No I have tooth or mouth problems that make it hard for me to eat 0 No I don't always have enough money to buy the food I need 0 No I eat alone most of the time 0 No I take three or more different prescribed or over-the-counter drugs a day 0 No Without wanting to, I have lost or gained 10 pounds in the last six months 0 No I am not always physically able to shop, cook and/or feed myself 0 No Nutrition Protocols Good Risk Protocol 0 No interventions needed Moderate Risk Protocol High Risk Proctocol Risk Level: Good Risk Score: 0 Electronic Signature(s) Signed: 01/27/2022 4:25:42 PM By: Rhae Hammock RN Entered By: Rhae Hammock on 01/27/2022 08:33:33

## 2022-01-27 NOTE — Progress Notes (Signed)
[name], [name] ([id number]) Visit Report for 01/27/2022 Allergy List Details Patient Name: Date of Service: [name] 01/27/2022 8:00 A M Medical Record Number: [id number] Patient Account Number: [id number] Date of Birth/Sex: Treating [name]: [birth year] (76 y.o. [name], [name] Primary Care [name]: [name] Other Clinician: Referring [name]: Treating [name]/Extender: [name]: 0 Allergies Active Allergies bee pollen Levaquin vitamin k Type: Medication Allergy Notes Electronic Signature(s) Signed: 01/27/2022 4:25:42 PM By: [name] Entered By: [name] on 01/27/2022 08:43:21 -------------------------------------------------------------------------------- Arrival Information Details Patient Name: Date of Service: [name]. 01/27/2022 8:00 A M Medical Record Number: [id number] Patient Account Number: [id number] Date of Birth/Sex: Treating [name]: [birth year] (76 y.o. [name], [name] Primary Care [name]: [name] Other Clinician: Referring [name]: Treating [name]/Extender: [name] in [name]: 0 Visit Information Patient Arrived: Ambulatory Arrival Time: 08:16 Accompanied By: self Transfer Assistance: None Patient Identification Verified: Yes Secondary Verification Process Completed: Yes Patient Requires Transmission-Based Precautions: No Patient Has Alerts: Yes Patient Alerts: Patient on Blood Thinner Electronic Signature(s) Signed: 01/27/2022 4:25:42 PM By: [name] Entered By: [name] on 01/27/2022 08:16:36 -------------------------------------------------------------------------------- Clinic Level of Care Assessment Details Patient Name: Date of Service: [name] 01/27/2022 8:00 A M Medical Record Number: [id number] Patient Account Number: [id number] Date of Birth/Sex: Treating [name]: [birth year] (76 y.o. [name], [name] Primary Care [name]: [name] Other Clinician: Referring [name]: Treating [name]/Extender: [name]: 0 Clinic Level of Care Assessment Items TOOL 4 Quantity Score X- 1 0 Use when only an EandM is performed on FOLLOW-UP visit ASSESSMENTS - Nursing Assessment / Reassessment X- 1 10 Reassessment of Co-morbidities (includes updates in patient status) X- 1 5 Reassessment of Adherence to [name] Plan ASSESSMENTS - Wound and Skin A ssessment / Reassessment X - Simple Wound Assessment / Reassessment - one wound 1 5 '[]'$  - 0 Complex Wound Assessment / Reassessment - multiple wounds '[]'$  - 0 Dermatologic / Skin Assessment (not related to wound area) ASSESSMENTS - Focused Assessment '[]'$  - 0 Circumferential Edema Measurements - multi extremities '[]'$  - 0 Nutritional Assessment / Counseling / Intervention '[]'$  - 0 Lower Extremity Assessment (monofilament, tuning fork, pulses) '[]'$  - 0 Peripheral Arterial Disease Assessment (using hand held doppler) ASSESSMENTS - Ostomy and/or Continence Assessment and Care '[]'$  - 0 Incontinence Assessment and Management '[]'$  - 0 Ostomy Care Assessment and Management (repouching, etc.) PROCESS - Coordination of Care X - Simple Patient / Family Education for ongoing care 1 15 '[]'$  - 0 Complex (extensive) Patient / Family Education for ongoing care X- 1 10 Staff obtains Programmer, systems, Records, T Results / Process Orders est '[]'$  - 0 Staff telephones HHA, Nursing Homes / Clarify orders / etc '[]'$  - 0 Routine Transfer to another Facility (non-emergent condition) '[]'$  - 0 Routine Hospital Admission (non-emergent condition) X- 1 15 New Admissions / Biomedical engineer / Ordering NPWT Apligraf, etc. , '[]'$  - 0 Emergency Hospital Admission (emergent condition) X- 1 10 Simple Discharge Coordination '[]'$  - 0 Complex (extensive) Discharge Coordination PROCESS - Special Needs '[]'$  - 0 Pediatric / Minor  Patient Management '[]'$  - 0 Isolation Patient Management '[]'$  - 0 Hearing / Language / Visual special needs '[]'$  - 0 Assessment of Community assistance (transportation, D/C planning, etc.) '[]'$  - 0 Additional assistance / Altered mentation '[]'$  - 0 Support Surface(s) Assessment (bed, cushion, seat, etc.) INTERVENTIONS - Wound Cleansing / Measurement X- 1 5 Simple Wound Cleansing - one wound '[]'$  -  0 Complex Wound Cleansing - multiple wounds X- 1 5 Wound Imaging (photographs - any number of wounds) '[]'$  - 0 Wound Tracing (instead of photographs) X- 1 5 Simple Wound Measurement - one wound '[]'$  - 0 Complex Wound Measurement - multiple wounds INTERVENTIONS - Wound Dressings X - Small Wound Dressing one or multiple wounds 1 10 '[]'$  - 0 Medium Wound Dressing one or multiple wounds '[]'$  - 0 Large Wound Dressing one or multiple wounds X- 1 5 Application of Medications - topical '[]'$  - 0 Application of Medications - injection INTERVENTIONS - Miscellaneous '[]'$  - 0 External ear exam '[]'$  - 0 Specimen Collection (cultures, biopsies, blood, body fluids, etc.) '[]'$  - 0 Specimen(s) / Culture(s) sent or taken to Lab for analysis '[]'$  - 0 Patient Transfer (multiple staff / [name] Lift / Similar devices) '[]'$  - 0 Simple Staple / Suture removal (25 or less) '[]'$  - 0 Complex Staple / Suture removal (26 or more) '[]'$  - 0 Hypo / Hyperglycemic Management (close monitor of Blood Glucose) '[]'$  - 0 Ankle / Brachial Index (ABI) - do not check if billed separately X- 1 5 Vital Signs Has the patient been seen at the hospital within the last three years: Yes Total Score: 105 Level Of Care: New/Established - Level 3 Electronic Signature(s) Signed: 01/27/2022 4:25:42 PM By: [name] Entered By: [name] on 01/27/2022 09:20:41 -------------------------------------------------------------------------------- Encounter Discharge Information Details Patient Name: Date of Service: [name]. 01/27/2022  8:00 A M Medical Record Number: [id number] Patient Account Number: [id number] Date of Birth/Sex: Treating [name]: 06/22/1946 (76 y.o. [name], [name] Primary Care [name]: [name] Other Clinician: Referring Claretha Townshend: Treating Stephen Turnbaugh/Extender: [name]: 0 Encounter Discharge Information Items Discharge Condition: Stable Ambulatory Status: Ambulatory Discharge Destination: Home Transportation: Private Auto Accompanied By: self Schedule Follow-up Appointment: Yes Clinical Summary of Care: Patient Declined Electronic Signature(s) Signed: 01/27/2022 4:25:42 PM By: [name] Entered By: [name] on 01/27/2022 09:21:32 -------------------------------------------------------------------------------- Lower Extremity Assessment Details Patient Name: Date of Service: [name]. 01/27/2022 8:00 A M Medical Record Number: [id number] Patient Account Number: [id number] Date of Birth/Sex: Treating [name]: [birth year] (76 y.o. [name], [name] Primary Care Raahi Korber: [name] Other Clinician: Referring Alyxandria Wentz: Treating Alila Sotero/Extender: Regenia Skeeter, Cyril Mourning Weeks in [name]: 0 Electronic Signature(s) Signed: 01/27/2022 4:25:42 PM By: [name] Entered By: [name] on 01/27/2022 08:33:47 -------------------------------------------------------------------------------- [place] Details Patient Name: Date of Service: [name] 01/27/2022 8:00 A M Medical Record Number: [id number] Patient Account Number: [id number] Date of Birth/Sex: Treating [name]: February 09, 1946 (76 y.o. [name], [name] Primary Care Calleigh Lafontant: [name] Other Clinician: Referring Karessa Onorato: Treating Micky Sheller/Extender: [name]: 0 Active Inactive Orientation to the Wound Care Program Nursing Diagnoses: Knowledge deficit related to  the wound healing center program Goals: Patient/caregiver will verbalize understanding of the [facility] Program Date Initiated: 01/27/2022 Target Resolution Date: 02/25/2022 Goal Status: Active Interventions: Provide education on orientation to the wound center Notes: Wound/Skin Impairment Nursing Diagnoses: Impaired tissue integrity Knowledge deficit related to ulceration/compromised skin integrity Goals: Patient will have a decrease in wound volume by X% from date: (specify in notes) Date Initiated: 01/27/2022 Target Resolution Date: 02/26/2022 Goal Status: Active Patient/caregiver will verbalize understanding of skin care regimen Date Initiated: 01/27/2022 Target Resolution Date: 02/26/2022 Goal Status: Active Ulcer/skin breakdown will have a volume reduction of 30% by week 4 Date Initiated: 01/27/2022 Target Resolution Date: 02/25/2022 Goal Status: Active Interventions:  Assess patient/caregiver ability to obtain necessary supplies Assess patient/caregiver ability to perform ulcer/skin care regimen upon admission and as needed Assess ulceration(s) every visit Notes: Electronic Signature(s) Signed: 01/27/2022 4:25:42 PM By: [name] Entered By: [name] on 01/27/2022 08:55:53 -------------------------------------------------------------------------------- Pain Assessment Details Patient Name: Date of Service: [name]. 01/27/2022 8:00 A M Medical Record Number: [id number] Patient Account Number: [id number] Date of Birth/Sex: Treating [name]: 07/31/1945 (76 y.o. [name], [name] Primary Care Suleman Gunning: [name] Other Clinician: Referring Shanya Ferriss: Treating Sherrel Ploch/Extender: Renae Fickle Weeks in [name]: 0 Active Problems Location of Pain Severity and Description of Pain Patient Has Paino Yes Site Locations Pain Location: Pain in Ulcers With Dressing Change: Yes Duration of the Pain. Constant / Intermittento  Constant Rate the pain. Current Pain Level: 7 Worst Pain Level: 10 Least Pain Level: 0 Tolerable Pain Level: 7 Character of Pain Describe the Pain: Aching Pain Management and Medication Current Pain Management: Medication: No Cold Application: No Rest: No Massage: No Activity: No T.E.N.S.: No Heat Application: No Leg drop or elevation: No Is the Current Pain Management Adequate: Adequate How does your wound impact your activities of daily livingo Sleep: No Bathing: No Appetite: No Relationship With Others: No Bladder Continence: No Emotions: No Bowel Continence: No Work: No Toileting: No Drive: No Dressing: No Hobbies: No Electronic Signature(s) Signed: 01/27/2022 4:25:42 PM By: [name] Entered By: [name] on 01/27/2022 08:34:10 -------------------------------------------------------------------------------- Patient/Caregiver Education Details Patient Name: Date of Service: [name] 7/5/2023andnbsp8:00 [place] Record Number: [id number] Patient Account Number: [id number] Date of Birth/Gender: Treating [name]: [birth year] (76 y.o. [name], [name] Primary Care Physician: [name] Other Clinician: Referring Physician: Treating Physician/Extender: [name]: 0 Education Assessment Education Provided To: Patient Education Topics Provided [place]: o Methods: Explain/Verbal Responses: Reinforcements needed, State content correctly Electronic Signature(s) Signed: 01/27/2022 4:25:42 PM By: [name] Entered By: [name] on 01/27/2022 08:56:02 -------------------------------------------------------------------------------- Wound Assessment Details Patient Name: Date of Service: [name]. 01/27/2022 8:00 A M Medical Record Number: [id number] Patient Account Number: [id number] Date of Birth/Sex: Treating [name]: 11/08/1945 (76 y.o. 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 Etiology: Date Acquired: 08/26/2021 Wound Open Weeks Of [name]: 0 Status: Clustered Wound: No Comorbid Asthma, Deep Vein Thrombosis, Hypertension, Peripheral Arterial History: Disease, Hepatitis B, Osteoarthritis, Neuropathy, Received Radiation Photos Wound Measurements Length: (cm) 0.3 Width: (cm) 0.3 Depth: (cm) 1 Area: (cm) 0.071 Volume: (cm) 0.071 % Reduction in Area: 0% % Reduction in Volume: 0% Epithelialization: None Tunneling: No Undermining: Yes Starting Position (o'clock): 12 Ending Position (o'clock): 12 Maximum Distance: (cm) 5 Wound Description Classification: Full Thickness With Exposed Support Structures Wound Margin: Distinct, outline attached Exudate Amount: Large Exudate Type: Serosanguineous Exudate Color: red, brown Foul Odor After Cleansing: No Slough/Fibrino Yes Wound Bed Granulation Amount: Large (67-100%) Exposed Structure Granulation Quality: Red, Pink Fascia Exposed: No Necrotic Amount: Small (1-33%) Fat Layer (Subcutaneous Tissue) Exposed: Yes Necrotic Quality: Adherent Slough Tendon Exposed: No Muscle Exposed: No Joint Exposed: No Bone Exposed: No [name] Notes Wound #1 (Breast) Wound Laterality: Right Cleanser Soap and Water Discharge Instruction: May shower and wash wound with dial antibacterial soap and water prior to dressing change. Wound Cleanser Discharge Instruction: Cleanse  the wound with wound cleanser prior to applying a clean dressing using gauze sponges, not tissue or cotton balls. Peri-Wound Care Topical Primary Dressing Hydrofera Blue Classic Foam Rope Dressing, 9x6  (mm/in) Discharge Instruction: Moisten with saline prior to packing Secondary Dressing Woven Gauze Sponge, Non-Sterile 4x4 in Discharge Instruction: Apply over primary dressing as directed. Zetuvit Plus Silicone Border Dressing 4x4 (in/in) Discharge Instruction: Apply silicone border over primary dressing as directed. Secured With Compression Wrap Compression Stockings Environmental education officer) Signed: 01/27/2022 4:25:42 PM By: [name] Entered By: [name] on 01/27/2022 08:46:47 -------------------------------------------------------------------------------- Vitals Details Patient Name: Date of Service: [name]. 01/27/2022 8:00 A M Medical Record Number: [id number] Patient Account Number: [id number] Date of Birth/Sex: Treating [name]: [birth year] (76 y.o. [name], [name] Primary Care Marcayla Budge: [name] Other Clinician: Referring Elhadji Pecore: Treating Claire Dolores/Extender: [name]: 0 Vital Signs Time Taken: 08:16 Temperature (F): 98.7 Height (in): 64 Pulse (bpm): 71 Source: Stated Respiratory Rate (breaths/min): 17 Weight (lbs): 180 Blood Pressure (mmHg): 138/86 Source: Stated Reference Range: 80 - 120 mg / dl Body Mass Index (BMI): 30.9 Electronic Signature(s) Signed: 01/27/2022 4:25:42 PM By: [name] Entered By: [name] on 01/27/2022 09:22:49

## 2022-01-29 ENCOUNTER — Telehealth: Payer: Self-pay | Admitting: Hematology and Oncology

## 2022-01-29 NOTE — Telephone Encounter (Signed)
Cancelled per 7/7 pt request, pt does not want to r/s, pt said they are getting treatment elsewhere

## 2022-02-03 ENCOUNTER — Encounter (HOSPITAL_BASED_OUTPATIENT_CLINIC_OR_DEPARTMENT_OTHER): Payer: Medicare HMO | Admitting: Physician Assistant

## 2022-02-03 DIAGNOSIS — L98492 Non-pressure chronic ulcer of skin of other sites with fat layer exposed: Secondary | ICD-10-CM | POA: Diagnosis not present

## 2022-02-03 NOTE — Progress Notes (Addendum)
[name], [name] ([id number]) Visit Report for 02/03/2022 Chief Complaint Document Details Patient Name: Date of Service: [name], [name] 02/03/2022 10:45 A M Medical Record Number: [id number] Patient Account Number: [id number] Date of Birth/Sex: Treating RN: 1945/11/26 (76 y.o. [name], [name] Primary Care Provider: [name] Other Clinician: Referring Provider: Treating Provider/Extender: [name] in Treatment: 1 Information Obtained from: Patient Chief Complaint Right breast ulcer Electronic Signature(s) Signed: 02/03/2022 8:23:52 AM By: Worthy Keeler PA-C Entered By: Worthy Keeler on 02/03/2022 08:23:52 -------------------------------------------------------------------------------- HPI Details Patient Name: Date of Service: [name], [name] 02/03/2022 10:45 A M Medical Record Number: [id number] Patient Account Number: [id number] Date of Birth/Sex: Treating RN: November 21, 1945 (76 y.o. [name], [name] Primary Care Provider: [name] Other Clinician: Referring Provider: Treating Provider/Extender: [name] in Treatment: 1 History of Present Illness HPI Description: 01-27-2022 upon evaluation today patient presents for initial evaluation here in our clinic concerning issues she has been having with her right breast. She had an area of calcium buildup where she had actual staples that were being pushed out that were noted from her previous breast surgery which was roughly 23 years ago when she had cancer. At that point she had also undergone radiation therapy. 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 Nonetheless after her most recent surgery in February to remove the staples and the calcium deposit this has started to build back up due to the damage to the breast tissue. She was seen initially by Dr. Brantley Stage. Eventually he referred her to Dr. Rebekah Chesterfield to see  about a mastectomy to take care of the issue. With that being said this was something that was good to be reserved just for worst-case scenario in fact they were a little bit concerned about her reaction considering her blood thinners and the fact that she may not do as well and surgery is what they would like to see with all the other medical problems that she has. For that reason they referred her to [name] in order to see what we can do from a healing standpoint. 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 02-03-2022 upon evaluation today patient appears to be doing okay in regard to her wound on the breast. Fortunately I do not see any signs of worsening. Unfortunately I do believe that she is still having some discomfort also there was some misunderstanding about how to do the dressing which I think will make it a little bit easier she should be putting it in before waiting it and then after its in and situated she can wet this. I think that is going to be a lot easier for her than what she has been trying to do. Electronic Signature(s) Signed: 02/03/2022 8:43:49 AM By: Worthy Keeler PA-C Entered By: Worthy Keeler on 02/03/2022 08:43:49 -------------------------------------------------------------------------------- Physical Exam Details Patient Name: Date of Service: [name], [name] 02/03/2022 10:45 A M Medical Record Number: [id number] Patient Account Number: [id number] Date of Birth/Sex: Treating RN: [birth year] (76 y.o. [name], [name] Primary Care Provider: [name] Other Clinician: Referring Provider: Treating Provider/Extender: Renae Fickle Weeks in  Treatment: 1  Constitutional Well-nourished and well-hydrated in no acute distress. Respiratory normal breathing without difficulty. Psychiatric this patient is able to make decisions and demonstrates good insight into disease process. Alert and Oriented x 3. pleasant and cooperative. Notes Upon inspection patient's wound bed appears to be doing okay she still tells me there is a lot of odor and she is concerned about infection. T that end I [place] o go ahead and see about getting her a PCR culture in order to see what is going on especially in light of the fact that she has multiple drug allergies I think being very specific about what we are seeing is good to be a good way to go here. Electronic Signature(s) Signed: 02/03/2022 8:44:10 AM By: Worthy Keeler PA-C Entered By: Worthy Keeler on 02/03/2022 08:44:10 -------------------------------------------------------------------------------- Physician Orders Details Patient Name: Date of Service: [name], [name] 02/03/2022 10:45 A M Medical Record Number: [id number] Patient Account Number: [id number] Date of Birth/Sex: Treating RN: 12-28-1945 (76 y.o. [name], Tammi Klippel Primary Care Provider: [name] Other Clinician: Referring Provider: Treating Provider/Extender: [name] in Treatment: 1 Verbal / Phone Orders: No Diagnosis Coding ICD-10 Coding Code Description T81.31XA Disruption of external operation (surgical) wound, not elsewhere classified, initial encounter L98.492 Non-pressure chronic ulcer of skin of other sites with fat layer exposed E83.59 Other disorders of calcium metabolism L59.8 Other specified disorders of the skin and subcutaneous tissue related to radiation D68.61 Antiphospholipid syndrome I10 Essential (primary) hypertension I25.10 Atherosclerotic heart disease of native coronary artery without angina pectoris Follow-up Appointments ppointment in 1 week. - Wednesday Jeri Cos and [place] Room # 9 1000 02/10/2022 Return A Other: - Will call you once results of the culture returns. Bathing/ Shower/ Hygiene May shower with protection but do not get wound dressing(s) wet. Wound Treatment Wound #1 - Breast Wound Laterality: Right Cleanser: Soap and Water 1 x Per GYI/94 Days Discharge Instructions: May shower and wash wound with dial antibacterial soap and water prior to dressing change. Cleanser: Wound Cleanser (Generic) 1 x Per Day/15 Days Discharge Instructions: Cleanse the wound with wound cleanser prior to applying a clean dressing using gauze sponges, not tissue or cotton balls. Prim Dressing: Hydrofera Blue Classic Foam Rope Dressing, 9x6 (mm/in) (Generic) 1 x Per Day/15 Days ary Discharge Instructions: Moisten with saline prior to packing Secondary Dressing: Woven Gauze Sponge, Non-Sterile 4x4 in (Generic) 1 x Per Day/15 Days Discharge Instructions: Apply over primary dressing as directed. Secondary Dressing: Zetuvit Plus Silicone Border Dressing 4x4 (in/in) (Generic) 1 x Per Day/15 Days Discharge Instructions: Apply silicone border over primary dressing as directed. Laboratory naerobe culture (MICRO) - culture of right breast. Bacteria identified in Unspecified specimen by A LOINC Code: 854-6 Convenience Name: Anaerobic culture Electronic Signature(s) Signed: 02/03/2022 4:33:23 PM By: Worthy Keeler PA-C Signed: 02/03/2022 5:36:22 PM By: Deon Pilling RN, BSN Entered By: Deon Pilling on 02/03/2022 08:39:39 Prescription 02/03/2022 -------------------------------------------------------------------------------- Alveda Reasons PA Patient Name: Provider: May 14, 1946 [id number] Date of Birth: NPI#Wanda Plump [id number] Sex: DEA #: [phone] Phone #: License #: [place] Patient Address: [place] [address] [place], [place] [address] [place], [place]  [phone] Allergies bee pollen; Levaquin; vitamin k Provider's Orders naerobe culture - culture of right breast. Bacteria identified in Unspecified specimen by A LOINC Code: 443-1 Convenience Name: Anaerobic culture Hand Signature: Date(s): Electronic Signature(s) Signed: 02/03/2022 4:33:23 PM By: Worthy Keeler PA-C  Signed: 02/03/2022 5:36:22 PM By: Deon Pilling RN, BSN Entered By: Deon Pilling on 02/03/2022 08:39:39 -------------------------------------------------------------------------------- Problem List Details Patient Name: Date of Service: [name] 02/03/2022 10:45 A M Medical Record Number: [id number] Patient Account Number: [id number] Date of Birth/Sex: Treating RN: June 18, 1946 (76 y.o. 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.59 Other disorders of calcium metabolism 01/27/2022 No Yes L59.8 Other specified disorders of the skin and subcutaneous tissue related to 01/27/2022 No Yes radiation D68.61 Antiphospholipid syndrome 01/27/2022 No Yes I10 Essential (primary) hypertension 01/27/2022 No Yes I25.10 Atherosclerotic heart disease of native coronary artery without angina pectoris 01/27/2022 No Yes Inactive Problems Resolved Problems Electronic Signature(s) Signed: 02/03/2022 8:23:45 AM By: Worthy Keeler PA-C Entered By: Worthy Keeler on 02/03/2022 08:23:45 -------------------------------------------------------------------------------- Progress Note Details Patient Name: Date of Service: [name] 02/03/2022  10:45 A M Medical Record Number: [id number] Patient Account Number: [id number] Date of Birth/Sex: Treating RN: [birth year] (76 y.o. [name], [name] Primary Care Provider: [name] Other Clinician: Referring Provider: Treating Provider/Extender: [name] in Treatment: 1 Subjective Chief Complaint Information obtained from Patient Right breast ulcer History of Present Illness (HPI) 01-27-2022 upon evaluation today patient presents for initial evaluation here in our clinic concerning issues she has been having with her right breast. She had an area of calcium buildup where she had actual staples that were being pushed out that were noted from her previous breast surgery which was roughly 23 years ago when she had cancer. At that point she had also undergone radiation therapy. 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 Nonetheless after her most recent surgery in February to remove the staples and the calcium deposit this has started to build back up due to the damage to the breast tissue. She was seen initially by Dr. Brantley Stage. Eventually he referred her to Dr. Rebekah Chesterfield to see about a mastectomy to take care of the issue. With that being said this was something that was good to be reserved just for worst-case scenario in fact they were a little bit concerned about her reaction considering her blood thinners and the fact that she may not do as well and surgery is what they would like to see with all the other medical problems that she has. For that reason they referred her to [name] in order to see what we can do from a healing standpoint. 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 02-03-2022 upon evaluation today patient appears to be doing okay in regard to her wound on the breast. Fortunately I do not see any signs of worsening. Unfortunately I do believe that she is still having some discomfort also there was some misunderstanding about how to do the dressing which I think will make it a little bit easier she should be putting it in before waiting  it and then after its in and situated she can wet this. I think that is going to be a lot easier for her than what she has been trying to do. Objective Constitutional Well-nourished and well-hydrated in no acute distress. Vitals Time Taken: 8:14 AM, Height: 64 in, Weight: 180 lbs, BMI: 30.9, Temperature: 98 F, Pulse: 66 bpm, Respiratory Rate: 16 breaths/min, Blood Pressure: 121/82 mmHg. Respiratory normal breathing without difficulty. Psychiatric this patient is able to make decisions and demonstrates good insight into disease process. Alert and Oriented x 3. pleasant and cooperative. General Notes: Upon inspection patient's wound bed appears to be doing okay she still tells me there is a lot of odor and she is concerned about infection. T o that end I Georgina Peer go ahead and see about getting her a PCR culture in order to see what is going on especially in light of the fact that she has multiple drug allergies I think being very specific about what we are seeing is good to be a good way to go here. Integumentary (Hair, Skin) Wound #1 status is Open. Original cause of wound was Surgical Injury. The date acquired was: 08/26/2021. The wound has been in treatment 1 weeks. The wound is located on the Right Breast. The wound measures 0.7cm length x 0.7cm width x 1.5cm depth; 0.385cm^2 area and 0.577cm^3 volume. There is Fat Layer (Subcutaneous Tissue) exposed. There is no tunneling noted, however, there is undermining starting at 6:00 and ending at 9:00 with a maximum  distance of 3.4cm. There is a large amount of serosanguineous drainage noted. Foul odor after cleansing was noted. The wound margin is distinct with the outline attached to the wound base. There is large (67-100%) red, pink granulation within the wound bed. There is a small (1-33%) amount of necrotic tissue within the wound bed including Adherent Slough. 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 - Wednesday Jeri Cos and [place] Room # 9 1000 02/10/2022 Other: - Will call you once results of the culture returns. Bathing/ Shower/ Hygiene: May shower with protection but do not get wound dressing(s) wet. Laboratory ordered were: Anaerobic culture-PCR culture - culture of right breast. WOUND #1: - Breast Wound Laterality: Right Cleanser: Soap and Water 1 x Per Day/15 Days Discharge Instructions: May shower and wash wound with dial antibacterial soap and water prior to dressing change. Cleanser: Wound Cleanser (Generic) 1 x Per Day/15 Days Discharge Instructions: Cleanse the wound with wound cleanser prior to applying a clean dressing using gauze sponges, not tissue or cotton balls. Prim Dressing: Hydrofera Blue Classic Foam Rope Dressing, 9x6 (mm/in) (Generic) 1 x Per Day/15 Days ary Discharge Instructions: Moisten with saline prior to packing Secondary Dressing: Woven Gauze Sponge, Non-Sterile 4x4 in (Generic) 1 x Per Day/15 Days Discharge Instructions: Apply over primary dressing as directed. Secondary Dressing:  Zetuvit Plus Silicone Border Dressing 4x4 (in/in) (Generic) 1 x Per Day/15 Days Discharge Instructions: Apply silicone border over primary dressing as directed. 1. I am good recommend that we go ahead and continue with the recommendation for wound care measures as before and this includes the use of the Hydrofera Blue rope which I  think is doing quite well. 2. Also, recommend that we have the patient continue with the Zetuvit bordered foam dressing to cover. 3. She should change this every other day which I think is probably can be the best plan at this point. We will see patient back for reevaluation in 1 week here in the clinic. If anything worsens or changes patient will contact our office for additional recommendations. Electronic Signature(s) Signed: 02/03/2022 8:44:52 AM By: Worthy Keeler PA-C Entered By: Worthy Keeler on 02/03/2022 08:44:51 -------------------------------------------------------------------------------- SuperBill Details Patient Name: Date of Service: [name] 02/03/2022 Medical Record Number: [id number] Patient Account Number: [id number] Date of Birth/Sex: Treating RN: Sep 27, 1945 (76 y.o. [name], Tammi Klippel Primary Care Provider: [name] Other Clinician: Referring Provider: Treating Provider/Extender: [name] in Treatment: 1 Diagnosis Coding ICD-10 Codes Code Description T81.31XA Disruption of external operation (surgical) wound, not elsewhere classified, initial encounter L98.492 Non-pressure chronic ulcer of skin of other sites with fat layer exposed E83.59 Other disorders of calcium metabolism L59.8 Other specified disorders of the skin and subcutaneous tissue related to radiation D68.61 Antiphospholipid syndrome I10 Essential (primary) hypertension I25.10 Atherosclerotic heart disease of native coronary artery without angina pectoris Facility Procedures CPT4 Code: [id number] Description: 99213 - WOUND CARE  VISIT-LEV 3 EST PT Modifier: Quantity: 1 Physician Procedures Electronic Signature(s) Signed: 02/03/2022 8:45:04 AM By: Worthy Keeler PA-C Entered By: Worthy Keeler on 02/03/2022 08:45:04

## 2022-02-03 NOTE — Progress Notes (Signed)
[name], [name] ([id number]) Visit Report for 02/03/2022 Allergy List Details Patient Name: Date of Service: [name], [name] 02/03/2022 10:45 A M Medical Record Number: [id number] Patient Account Number: [id number] Date of Birth/Sex: Treating RN: 1945-08-09 (76 y.o. [name], Tammi Klippel Primary Care Adrick Kestler: [name] Other Clinician: Referring Francys Bolin: Treating Jameriah Trotti/Extender: Regenia Skeeter, Cyril Mourning Weeks in Treatment: 1 Allergies Active Allergies bee pollen Levaquin phytonadione (vitamin K1) Reaction: anaphylaxis baclofen Reaction: dizziness cephalexin Iodinated Contrast Media Benadryl Reaction: rash Cipro Reaction: rash Crestor Reaction: muscle aches Cymbalta Reaction: thrush Daypro Reaction: rash doxycycline Reaction: thrush levofloxacin Reaction: rash Sulfa (Sulfonamide Antibiotics) Reaction: rash Zocor Reaction: muscle aches Allergy Notes Electronic Signature(s) Signed: 02/03/2022 5:36:22 PM By: Deon Pilling RN, BSN Entered By: Deon Pilling on 02/03/2022 10:04:20 -------------------------------------------------------------------------------- Arrival Information Details Patient Name: Date of Service: [name] 02/03/2022 10:45 A M Medical Record Number: [id number] Patient Account Number: [id number] Date of Birth/Sex: Treating RN: Dec 16, 1945 (76 y.o. [name], Meta.Reding Primary Care Salvatrice Morandi: [name] Other Clinician: Referring Shaterria Sager: Treating Chi Woodham/Extender: [name] in Treatment: 1 Visit Information Patient Arrived: Ambulatory Arrival Time: 08:12 Accompanied By: self Transfer Assistance: None Patient Identification Verified: Yes Secondary Verification Process Completed: Yes Patient Requires Transmission-Based Precautions: No Patient Has Alerts: Yes Patient Alerts: Patient on Blood Thinner History Since Last Visit Added or deleted any medications: No Any new allergies or adverse  reactions: No Had a fall or experienced change in activities of daily living that may affect risk of falls: No Signs or symptoms of abuse/neglect since last visito No Hospitalized since last visit: No Implantable device outside of the clinic excluding cellular tissue based products placed in the center since last visit: No Has Dressing in Place as Prescribed: Yes Electronic Signature(s) Signed: 02/03/2022 5:36:22 PM By: Deon Pilling RN, BSN Entered By: Deon Pilling on 02/03/2022 08:13:10 -------------------------------------------------------------------------------- Clinic Level of Care Assessment Details Patient Name: Date of Service: AMARE, KONTOS 02/03/2022 10:45 A M Medical Record Number: [id number] Patient Account Number: [id number] Date of Birth/Sex: Treating RN: [birth year] (76 y.o. [name], Tammi Klippel Primary Care Hazelene Doten: [name] Other Clinician: Referring Natascha Edmonds: Treating Rayyan Orsborn/Extender: [name] in Treatment: 1 Clinic Level of Care Assessment Items TOOL 4 Quantity Score X- 1 0 Use when only an EandM is performed on FOLLOW-UP visit ASSESSMENTS - Nursing Assessment / Reassessment X- 1 10 Reassessment of Co-morbidities (includes updates in patient status) X- 1 5 Reassessment of Adherence to Treatment Plan ASSESSMENTS - Wound and Skin A ssessment / Reassessment X - Simple Wound Assessment / Reassessment - one wound 1 5 '[]'$  - 0 Complex Wound Assessment / Reassessment - multiple wounds X- 1 10 Dermatologic / Skin Assessment (not related to wound area) ASSESSMENTS - Focused Assessment '[]'$  - 0 Circumferential Edema Measurements - multi extremities '[]'$  - 0 Nutritional Assessment / Counseling / Intervention '[]'$  - 0 Lower Extremity Assessment (monofilament, tuning fork, pulses) '[]'$  - 0 Peripheral Arterial Disease Assessment (using hand held doppler) ASSESSMENTS - Ostomy and/or Continence Assessment and Care '[]'$  - 0 Incontinence  Assessment and Management '[]'$  - 0 Ostomy Care Assessment and Management (repouching, etc.) PROCESS - Coordination of Care X - Simple Patient / Family Education for ongoing care 1 15 '[]'$  - 0 Complex (extensive) Patient / Family Education for ongoing care X- 1 10 Staff obtains Consents, Records, T Results / Process Orders est '[]'$  - 0 Staff telephones HHA, Nursing Homes / Clarify orders / etc '[]'$  - 0 Routine Transfer to another  Facility (non-emergent condition) '[]'$  - 0 Routine Hospital Admission (non-emergent condition) '[]'$  - 0 New Admissions / Biomedical engineer / Ordering NPWT Apligraf, etc. , '[]'$  - 0 Emergency Hospital Admission (emergent condition) X- 1 10 Simple Discharge Coordination '[]'$  - 0 Complex (extensive) Discharge Coordination PROCESS - Special Needs '[]'$  - 0 Pediatric / Minor Patient Management '[]'$  - 0 Isolation Patient Management '[]'$  - 0 Hearing / Language / Visual special needs '[]'$  - 0 Assessment of Community assistance (transportation, D/C planning, etc.) '[]'$  - 0 Additional assistance / Altered mentation '[]'$  - 0 Support Surface(s) Assessment (bed, cushion, seat, etc.) INTERVENTIONS - Wound Cleansing / Measurement X - Simple Wound Cleansing - one wound 1 5 '[]'$  - 0 Complex Wound Cleansing - multiple wounds X- 1 5 Wound Imaging (photographs - any number of wounds) '[]'$  - 0 Wound Tracing (instead of photographs) X- 1 5 Simple Wound Measurement - one wound '[]'$  - 0 Complex Wound Measurement - multiple wounds INTERVENTIONS - Wound Dressings X - Small Wound Dressing one or multiple wounds 1 10 '[]'$  - 0 Medium Wound Dressing one or multiple wounds '[]'$  - 0 Large Wound Dressing one or multiple wounds '[]'$  - 0 Application of Medications - topical '[]'$  - 0 Application of Medications - injection INTERVENTIONS - Miscellaneous '[]'$  - 0 External ear exam X- 1 5 Specimen Collection (cultures, biopsies, blood, body fluids, etc.) '[]'$  - 0 Specimen(s) / Culture(s) sent or taken to  Lab for analysis '[]'$  - 0 Patient Transfer (multiple staff / Civil Service fast streamer / Similar devices) '[]'$  - 0 Simple Staple / Suture removal (25 or less) '[]'$  - 0 Complex Staple / Suture removal (26 or more) '[]'$  - 0 Hypo / Hyperglycemic Management (close monitor of Blood Glucose) '[]'$  - 0 Ankle / Brachial Index (ABI) - do not check if billed separately X- 1 5 Vital Signs Has the patient been seen at the hospital within the last three years: Yes Total Score: 100 Level Of Care: New/Established - Level 3 Electronic Signature(s) Signed: 02/03/2022 5:36:22 PM By: Deon Pilling RN, BSN Entered By: Deon Pilling on 02/03/2022 08:40:06 -------------------------------------------------------------------------------- Encounter Discharge Information Details Patient Name: Date of Service: [name] 02/03/2022 10:45 A M Medical Record Number: [id number] Patient Account Number: [id number] Date of Birth/Sex: Treating RN: [birth year] (76 y.o. [name] Primary Care Kabeer Hoagland: [name] Other Clinician: Referring Leone Mobley: Treating Kenedie Dirocco/Extender: [name] in Treatment: 1 Encounter Discharge Information Items Discharge Condition: Stable Ambulatory Status: Ambulatory Discharge Destination: Home Transportation: Private Auto Accompanied By: self Schedule Follow-up Appointment: Yes Clinical Summary of Care: Electronic Signature(s) Signed: 02/03/2022 5:36:22 PM By: Deon Pilling RN, BSN Entered By: Deon Pilling on 02/03/2022 08:40:34 -------------------------------------------------------------------------------- Lower Extremity Assessment Details Patient Name: Date of Service: [name], [name] 02/03/2022 10:45 A M Medical Record Number: [id number] Patient Account Number: [id number] Date of Birth/Sex: Treating RN: 08/10/1945 (76 y.o. [name] Primary Care Nicolai Labonte: [name] Other Clinician: Referring Raveena Hebdon: Treating Toshi Ishii/Extender:  [name] in Treatment: 1 Electronic Signature(s) Signed: 02/03/2022 5:36:22 PM By: Deon Pilling RN, BSN Entered By: Deon Pilling on 02/03/2022 08:14:16 -------------------------------------------------------------------------------- [place] Details Patient Name: Date of Service: [name], [name] 02/03/2022 10:45 A M Medical Record Number: [id number] Patient Account Number: [id number] Date of Birth/Sex: Treating RN: 1945/08/09 (76 y.o. [name] Primary Care Jakob Kimberlin: [name] Other Clinician: Referring Sabryn Preslar: Treating Holiday Mcmenamin/Extender: Renae Fickle Weeks in Treatment: 1 Active Inactive Wound/Skin Impairment Nursing Diagnoses: Impaired tissue integrity  Knowledge deficit related to ulceration/compromised skin integrity Goals: Patient will have a decrease in wound volume by X% from date: (specify in notes) Date Initiated: 01/27/2022 Target Resolution Date: 02/26/2022 Goal Status: Active Patient/caregiver will verbalize understanding of skin care regimen Date Initiated: 01/27/2022 Target Resolution Date: 02/26/2022 Goal Status: Active Ulcer/skin breakdown will have a volume reduction of 30% by week 4 Date Initiated: 01/27/2022 Target Resolution Date: 02/25/2022 Goal Status: Active Interventions: Assess patient/caregiver ability to obtain necessary supplies Assess patient/caregiver ability to perform ulcer/skin care regimen upon admission and as needed Assess ulceration(s) every visit Notes: Electronic Signature(s) Signed: 02/03/2022 5:36:22 PM By: Deon Pilling RN, BSN Entered By: Deon Pilling on 02/03/2022 08:30:31 -------------------------------------------------------------------------------- Pain Assessment Details Patient Name: Date of Service: [name] 02/03/2022 10:45 A M Medical Record Number: [id number] Patient Account Number: [id number] Date of Birth/Sex: Treating  RN: April 22, 1946 (76 y.o. [name] Primary Care Arsema Tusing: [name] Other Clinician: Referring Michail Boyte: Treating Devita Nies/Extender: [name] in Treatment: 1 Active Problems Location of Pain Severity and Description of Pain Patient Has Paino Yes Site Locations Pain Location: Pain in Ulcers Duration of the Pain. Constant / Intermittento Intermittent Rate the pain. Current Pain Level: 5 Worst Pain Level: 9 Character of Pain Describe the Pain: Heavy, Sharp Pain Management and Medication Current Pain Management: Medication: Yes Cold Application: No Rest: No Massage: No Activity: No T.E.N.S.: No Heat Application: No Leg drop or elevation: No Is the Current Pain Management Adequate: Adequate How does your wound impact your activities of daily livingo Sleep: No Bathing: No Appetite: No Relationship With Others: No Bladder Continence: No Emotions: No Bowel Continence: No Work: No Toileting: No Drive: No Dressing: No Hobbies: No Notes pain comes and goes per patient. Electronic Signature(s) Signed: 02/03/2022 5:36:22 PM By: Deon Pilling RN, BSN Entered By: Deon Pilling on 02/03/2022 08:15:12 -------------------------------------------------------------------------------- Patient/Caregiver Education Details Patient Name: Date of Service: [name] 7/12/2023andnbsp10:45 [place] Record Number: [id number] Patient Account Number: [id number] Date of Birth/Gender: Treating RN: 31-Jul-1945 (76 y.o. [name] Primary Care Physician: [name] Other Clinician: Referring Physician: Treating Physician/Extender: [name] in Treatment: 1 Education Assessment Education Provided To: Patient Education Topics Provided Wound/Skin Impairment: Handouts: Skin Care Do's and Dont's Methods: Explain/Verbal Responses: Reinforcements needed Electronic Signature(s) Signed: 02/03/2022  5:36:22 PM By: Deon Pilling RN, BSN Entered By: Deon Pilling on 02/03/2022 08:30:42 -------------------------------------------------------------------------------- Wound Assessment Details Patient Name: Date of Service: [name] 02/03/2022 10:45 A M Medical Record Number: [id number] Patient Account Number: [id number] Date of Birth/Sex: Treating RN: [birth year] (75 y.o. 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 Etiology: Date Acquired: 08/26/2021 Wound Open Weeks Of Treatment: 1 Status: Clustered Wound: No Comorbid Asthma, Deep Vein Thrombosis, Hypertension, Peripheral Arterial History: Disease, Hepatitis B, Osteoarthritis, Neuropathy, Received Radiation Photos Wound Measurements Length: (cm) 0.7 Width: (cm) 0.7 Depth: (cm) 1.5 Area: (cm) 0.385 Volume: (cm) 0.577 % Reduction in Area: -442.3% % Reduction in Volume: -712.7% Epithelialization: None Tunneling: No Undermining: Yes Starting Position (o'clock): 6 Ending Position (o'clock): 9 Maximum Distance: (cm) 3.4 Wound Description Classification: Full Thickness With Exposed Support Structures Wound Margin: Distinct, outline attached Exudate Amount: Large Exudate Type: Serosanguineous Exudate Color: red, brown Foul Odor After Cleansing: Yes Due to Product Use: No Slough/Fibrino  Yes Wound Bed Granulation Amount: Large (67-100%) Exposed Structure Granulation Quality: Red, Pink Fascia Exposed: No Necrotic Amount: Small (1-33%) Fat Layer (Subcutaneous Tissue) Exposed: Yes Necrotic Quality: Adherent Slough Tendon Exposed: No Muscle Exposed: No Joint Exposed: No Bone Exposed: No Treatment Notes Wound #1 (Breast) Wound  Laterality: Right Cleanser Soap and Water Discharge Instruction: May shower and wash wound with dial antibacterial soap and water prior to dressing change. Wound Cleanser Discharge Instruction: Cleanse the wound with wound cleanser prior to applying a clean dressing using gauze sponges, not tissue or cotton balls. Peri-Wound Care Topical Primary Dressing Hydrofera Blue Classic Foam Rope Dressing, 9x6 (mm/in) Discharge Instruction: Moisten with saline prior to packing Secondary Dressing Woven Gauze Sponge, Non-Sterile 4x4 in Discharge Instruction: Apply over primary dressing as directed. Zetuvit Plus Silicone Border Dressing 4x4 (in/in) Discharge Instruction: Apply silicone border over primary dressing as directed. Secured With Compression Wrap Compression Stockings Environmental education officer) Signed: 02/03/2022 5:36:22 PM By: Deon Pilling RN, BSN Entered By: Deon Pilling on 02/03/2022 08:21:06 -------------------------------------------------------------------------------- Vitals Details Patient Name: Date of Service: [name] 02/03/2022 10:45 A M Medical Record Number: [id number] Patient Account Number: [id number] Date of Birth/Sex: Treating RN: [birth year] (76 y.o. [name], Tammi Klippel Primary Care Mayara Paulson: [name] Other Clinician: Referring Monnica Saltsman: Treating Stephonie Wilcoxen/Extender: [name] in Treatment: 1 Vital Signs Time Taken: 08:14 Temperature (F): 98 Height (in): 64 Pulse (bpm): 66 Weight (lbs): 180 Respiratory Rate (breaths/min): 16 Body Mass Index (BMI): 30.9 Blood Pressure (mmHg): 121/82 Reference Range: 80 - 120 mg / dl Electronic Signature(s) Signed: 02/03/2022 5:36:22 PM By: Deon Pilling RN, BSN Entered By: Deon Pilling on 02/03/2022 08:14:39

## 2022-02-10 ENCOUNTER — Encounter (HOSPITAL_BASED_OUTPATIENT_CLINIC_OR_DEPARTMENT_OTHER): Payer: Medicare HMO | Admitting: Physician Assistant

## 2022-02-10 DIAGNOSIS — L98492 Non-pressure chronic ulcer of skin of other sites with fat layer exposed: Secondary | ICD-10-CM | POA: Diagnosis not present

## 2022-02-10 NOTE — Progress Notes (Signed)
[name], [name] ([id number]) Visit Report for 02/10/2022 Chief Complaint Document Details Patient Name: Date of Service: [name] 02/10/2022 10:00 A M Medical Record Number: [id number] Patient Account Number: [id number] Date of Birth/Sex: Treating RN: 04-Mar-1946 (76 y.o. [name], [name] Primary Care Provider: [name] Other Clinician: Referring Provider: Treating Provider/Extender: [name] in Treatment: 2 Information Obtained from: Patient Chief Complaint Right breast ulcer Electronic Signature(s) Signed: 02/10/2022 10:53:30 AM By: Worthy Keeler PA-C Entered By: Worthy Keeler on 02/10/2022 10:53:30 -------------------------------------------------------------------------------- HPI Details Patient Name: Date of Service: [name]. 02/10/2022 10:00 A M Medical Record Number: [id number] Patient Account Number: [id number] Date of Birth/Sex: Treating RN: Feb 10, 1946 (76 y.o. [name], [name] Primary Care Provider: [name] Other Clinician: Referring Provider: Treating Provider/Extender: [name] in Treatment: 2 History of Present Illness HPI Description: 01-27-2022 upon evaluation today patient presents for initial evaluation here in our clinic concerning issues she has been having with her right breast. She had an area of calcium buildup where she had actual staples that were being pushed out that were noted from her previous breast surgery which was roughly 23 years ago when she had cancer. At that point she had also undergone radiation therapy. 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 Nonetheless after her most recent surgery in February to remove the staples and the calcium deposit this has started to build back up due to the damage to the breast tissue. She was seen initially by Dr. Brantley Stage. Eventually he referred her to Dr. Rebekah Chesterfield to see  about a mastectomy to take care of the issue. With that being said this was something that was good to be reserved just for worst-case scenario in fact they were a little bit concerned about her reaction considering her blood thinners and the fact that she may not do as well and surgery is what they would like to see with all the other medical problems that she has. For that reason they referred her to [name] in order to see what we can do from a healing standpoint. 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 02-03-2022 upon evaluation today patient appears to be doing okay in regard to her wound on the breast. Fortunately I do not see any signs of worsening. Unfortunately I do believe that she is still having some discomfort also there was some misunderstanding about how to do the dressing which I think will make it a little bit easier she should be putting it in before waiting it and then after its in and situated she can wet this. I think that is going to be a lot easier for her than what she has been trying to do. 02-10-2022 upon evaluation today patient appears to be doing about the same in regard to her wound. She has been tolerating the dressing changes without complication. We are actually starting her on linezolid today actually had to apply to get this approved by her insurance I am hopeful that this will be affordable for her now it was going to cost her way too much before. With that being said I do believe that she  is headed in the right direction which is great news. No fevers, chills, nausea, vomiting, or diarrhea. Electronic Signature(s) Signed: 02/10/2022 11:28:13 AM By: Worthy Keeler  PA-C Entered By: Worthy Keeler on 02/10/2022 11:28:13 -------------------------------------------------------------------------------- Physical Exam Details Patient Name: Date of Service: [name] 02/10/2022 10:00 A M Medical Record Number: [id number] Patient Account Number: [id number] Date of Birth/Sex: Treating RN: [birth year] (76 y.o. [name] Primary Care Provider: [name] Other Clinician: Referring Provider: Treating Provider/Extender: Renae Fickle Weeks in Treatment: 2 Constitutional Well-nourished and well-hydrated in no acute distress. Respiratory normal breathing without difficulty. Psychiatric this patient is able to make decisions and demonstrates good insight into disease process. Alert and Oriented x 3. pleasant and cooperative. Notes Upon inspection patient's wound bed actually still showed signs of some drainage that she states has been somewhat purulent I do believe the Hydrofera Blue rope is doing decently well here. With that being said I think that the patient overall is really doing quite well. I am pleased with what we are seeing at this point. We have gotten the approval for the linezolid which I think is going to be beneficial for her as well. Electronic Signature(s) Signed: 02/10/2022 11:28:56 AM By: Worthy Keeler PA-C Entered By: Worthy Keeler on 02/10/2022 11:28:56 -------------------------------------------------------------------------------- Physician Orders Details Patient Name: Date of Service: [name] 02/10/2022 10:00 A M Medical Record Number: [id number] Patient Account Number: [id number] Date of Birth/Sex: Treating RN: [birth year] (76 y.o. [name], [name] Primary Care Provider: [name] Other Clinician: Referring Provider: Treating Provider/Extender: [name] in Treatment: 2 Verbal / Phone Orders: No Diagnosis Coding Follow-up  Appointments ppointment in 1 week. - 02/17/22 @ 1000 w/ Margarita Grizzle (Dr. Dellia Nims covering) and Allayne Butcher [employer] Covering) Room # 9 Return A Bathing/ Shower/ Hygiene May shower with protection but do not get wound dressing(s) wet. Wound Treatment Wound #1 - Breast Wound Laterality: Right Cleanser: Soap and Water 1 x Per PPJ/09 Days Discharge Instructions: May shower and wash wound with dial antibacterial soap and water prior to dressing change. Cleanser: Wound Cleanser (Generic) 1 x Per Day/15 Days Discharge Instructions: Cleanse the wound with wound cleanser prior to applying a clean dressing using gauze sponges, not tissue or cotton balls. Prim Dressing: Hydrofera Blue Classic Foam Rope Dressing, 9x6 (mm/in) (Generic) 1 x Per Day/15 Days ary Discharge Instructions: Moisten with saline prior to packing Secondary Dressing: Woven Gauze Sponge, Non-Sterile 4x4 in (Generic) 1 x Per Day/15 Days Discharge Instructions: Apply over primary dressing as directed. Secondary Dressing: Zetuvit Plus Silicone Border Dressing 4x4 (in/in) (Generic) 1 x Per Day/15 Days Discharge Instructions: Apply silicone border over primary dressing as directed. Electronic Signature(s) Signed: 02/10/2022 3:34:17 PM By: Rhae Hammock RN Signed: 02/10/2022 3:46:42 PM By: Worthy Keeler PA-C Entered By: Rhae Hammock on 02/10/2022 10:47:18 -------------------------------------------------------------------------------- Problem List Details Patient Name: Date of Service: [name]. 02/10/2022 10:00 A M Medical Record Number: [id number] Patient Account Number: [id number] Date of Birth/Sex: Treating RN: 01-17-1946 (76 y.o. [name], [name] Primary Care Provider: [name] Other Clinician: Referring Provider: Treating Provider/Extender: [name] in Treatment: 2 Active Problems ICD-10 Encounter Code Description Active Date MDM Diagnosis T81.31XA Disruption of external  operation (surgical) wound, not elsewhere classified, 01/27/2022 No Yes initial encounter L98.492 Non-pressure chronic ulcer of skin of other sites with fat layer exposed 01/27/2022 No Yes E83.59 Other disorders of calcium metabolism 01/27/2022 No Yes L59.8 Other specified disorders  of the skin and subcutaneous tissue related to 01/27/2022 No Yes radiation D68.61 Antiphospholipid syndrome 01/27/2022 No Yes I10 Essential (primary) hypertension 01/27/2022 No Yes I25.10 Atherosclerotic heart disease of native coronary artery without angina pectoris 01/27/2022 No Yes Inactive Problems Resolved Problems Electronic Signature(s) Signed: 02/10/2022 10:53:25 AM By: Worthy Keeler PA-C Entered By: Worthy Keeler on 02/10/2022 10:53:25 -------------------------------------------------------------------------------- Progress Note Details Patient Name: Date of Service: [name]. 02/10/2022 10:00 A M Medical Record Number: [id number] Patient Account Number: [id number] Date of Birth/Sex: Treating RN: 20-Jun-1946 (76 y.o. [name], [name] Primary Care Provider: [name] Other Clinician: Referring Provider: Treating Provider/Extender: [name] in Treatment: 2 Subjective Chief Complaint Information obtained from Patient Right breast ulcer History of Present Illness (HPI) 01-27-2022 upon evaluation today patient presents for initial evaluation here in our clinic concerning issues she has been having with her right breast. She had an area of calcium buildup where she had actual staples that were being pushed out that were noted from her previous breast surgery which was roughly 23 years ago when she had cancer. At that point she had also undergone radiation therapy. 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 Nonetheless after her most recent surgery in February to remove the staples and the calcium deposit this has started to build  back up due to the damage to the breast tissue. She was seen initially by Dr. Brantley Stage. Eventually he referred her to Dr. Rebekah Chesterfield to see about a mastectomy to take care of the issue. With that being said this was something that was good to be reserved just for worst-case scenario in fact they were a little bit concerned about her reaction considering her blood thinners and the fact that she may not do as well and surgery is what they would like to see with all the other medical problems that she has. For that reason they referred her to [name] in order to see what we can do from a healing standpoint. 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 02-03-2022 upon evaluation today patient appears to be doing okay in regard to her wound on the breast. Fortunately I do not see any signs of worsening. Unfortunately I do believe that she is still having some discomfort also there was some misunderstanding about how to do the dressing which I think will make it a little bit easier she should be putting it in before waiting it and then after its in and situated she can wet this. I think that is going to be a lot easier for her than what she has been trying to do. 02-10-2022 upon evaluation today patient appears to be doing about the same in regard to her wound. She has been tolerating the dressing changes without complication. We are actually starting her on linezolid today actually had to apply to get this approved by her insurance I am hopeful that this will be affordable for her now it was going to cost her way too much before. With that being said I do believe that she is headed in the right direction which is  great news. No fevers, chills, nausea, vomiting, or diarrhea. Objective  Constitutional Well-nourished and well-hydrated in no acute distress. Vitals Time Taken: 10:35 AM, Height: 64 in, Weight: 180 lbs, BMI: 30.9, Temperature: 98.4 F, Pulse: 64 bpm, Respiratory Rate: 16 breaths/min, Blood Pressure: 112/66 mmHg. Respiratory normal breathing without difficulty. Psychiatric this patient is able to make decisions and demonstrates good insight into disease process. Alert and Oriented x 3. pleasant and cooperative. General Notes: Upon inspection patient's wound bed actually still showed signs of some drainage that she states has been somewhat purulent I do believe the Hydrofera Blue rope is doing decently well here. With that being said I think that the patient overall is really doing quite well. I am pleased with what we are seeing at this point. We have gotten the approval for the linezolid which I think is going to be beneficial for her as well. Integumentary (Hair, Skin) Wound #1 status is Open. Original cause of wound was Surgical Injury. The date acquired was: 08/26/2021. The wound has been in treatment 2 weeks. The wound is located on the Right Breast. The wound measures 0.7cm length x 0.6cm width x 1.4cm depth; 0.33cm^2 area and 0.462cm^3 volume. There is Fat Layer (Subcutaneous Tissue) exposed. There is no undermining noted, however, there is tunneling at 6:00 with a maximum distance of 3.4cm. There is a large amount of serosanguineous drainage noted. Foul odor after cleansing was noted. The wound margin is distinct with the outline attached to the wound base. There is large (67-100%) red, pink granulation within the wound bed. There is a small (1-33%) amount of necrotic tissue within the wound bed including Adherent Slough. 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 - 02/17/22 @ 1000 w/ Margarita Grizzle (Dr. Dellia Nims covering) and Allayne Butcher [facility] Covering) Room # 9 Bathing/ Shower/ Hygiene: May shower with protection but do not get wound dressing(s) wet. WOUND #1: - Breast Wound Laterality: Right Cleanser: Soap and Water 1 x Per [place]/07 Days Discharge Instructions: May shower and wash wound with dial antibacterial soap and water prior to dressing change. Cleanser: Wound Cleanser (Generic) 1 x Per Day/15 Days Discharge Instructions: Cleanse the wound with wound cleanser prior to applying a clean dressing using gauze sponges, not tissue or cotton balls. Prim Dressing: Hydrofera Blue Classic Foam Rope Dressing, 9x6 (mm/in) (Generic) 1 x Per Day/15 Days ary Discharge Instructions: Moisten with saline prior to packing Secondary Dressing: Woven Gauze Sponge, Non-Sterile 4x4 in (Generic) 1 x Per Day/15 Days Discharge Instructions: Apply over primary dressing as directed. Secondary Dressing: Zetuvit Plus Silicone Border Dressing 4x4 (in/in) (Generic) 1 x Per Day/15 Days Discharge Instructions: Apply silicone border over primary dressing as directed. 1. I am going to suggest that we going continue with the wound care measures as before and the patient is in agreement with plan. This includes the use of the Hydrofera Blue rope for the time being which I think is doing well. 2. I am also can recommend that we look into the wound VAC as a possibility for her. I do think this could be beneficial and the hope would be it would allow her to actually get this to heal in much more effectively and quickly. 3. I am  also can recommend that  we have the patient continue with the for now bordered foam dressing to cover which is doing decently well. We will see patient back for reevaluation in 1 week here in the clinic. If anything worsens or changes patient will contact our office for additional recommendations. Electronic Signature(s) Signed: 02/10/2022 11:29:33 AM By: Worthy Keeler PA-C Entered By: Worthy Keeler on 02/10/2022 11:29:33 -------------------------------------------------------------------------------- SuperBill Details Patient Name: Date of Service: [name] 02/10/2022 Medical Record Number: [id number] Patient Account Number: [id number] Date of Birth/Sex: Treating RN: 07-10-1946 (76 y.o. [name], [name] Primary Care Provider: [name] Other Clinician: Referring Provider: Treating Provider/Extender: [name] in Treatment: 2 Diagnosis Coding ICD-10 Codes Code Description T81.31XA Disruption of external operation (surgical) wound, not elsewhere classified, initial encounter L98.492 Non-pressure chronic ulcer of skin of other sites with fat layer exposed E83.59 Other disorders of calcium metabolism L59.8 Other specified disorders of the skin and subcutaneous tissue related to radiation D68.61 Antiphospholipid syndrome I10 Essential (primary) hypertension I25.10 Atherosclerotic heart disease of native coronary artery without angina pectoris Facility Procedures CPT4 Code: [id number] Description: 99213 - WOUND CARE VISIT-LEV 3 EST PT Modifier: Quantity: 1 Physician Procedures : CPT4 Code Description Modifier [id number] - WC PHYS LEVEL 4 - EST PT ICD-10 Diagnosis Description T81.31XA Disruption of external operation (surgical) wound, not elsewhere classified, initial encounter L98.492 Non-pressure chronic ulcer of skin of  other sites with fat layer exposed E83.59 Other disorders of calcium metabolism L59.8 Other specified disorders of the skin and subcutaneous  tissue related to radiation Quantity: 1 Electronic Signature(s) Signed: 02/10/2022 11:29:45 AM By: Worthy Keeler PA-C Entered By: Worthy Keeler on 02/10/2022 11:29:45

## 2022-02-10 NOTE — Progress Notes (Signed)
KHALEA, VENTURA ([id number]) Visit Report for 02/10/2022 Arrival Information Details Patient Name: Date of Service: [name], [name] 02/10/2022 10:00 A M Medical Record Number: [id number] Patient Account Number: [id number] Date of Birth/Sex: Treating RN: 12/16/1945 (76 y.o. [name], [name] Primary Care Kwabena Strutz: [name] Other Clinician: Referring Maricsa Sammons: Treating Maevis Mumby/Extender: [name] in Treatment: 2 Visit Information History Since Last Visit Added or deleted any medications: No Patient Arrived: Ambulatory Any new allergies or adverse reactions: No Arrival Time: 10:25 Had a fall or experienced change in No Accompanied By: self activities of daily living that may affect Transfer Assistance: None risk of falls: Secondary Verification Process Completed: Yes Signs or symptoms of abuse/neglect since last visito No Patient Requires Transmission-Based Precautions: No Hospitalized since last visit: No Patient Has Alerts: Yes Implantable device outside of the clinic excluding No Patient Alerts: Patient on Blood Thinner cellular tissue based products placed in the center since last visit: Pain Present Now: Yes Electronic Signature(s) Signed: 02/10/2022 3:44:56 PM By: Erenest Blank Entered By: Erenest Blank on 02/10/2022 10:30:51 -------------------------------------------------------------------------------- Clinic Level of Care Assessment Details Patient Name: Date of Service: JAANA, BRODT 02/10/2022 10:00 A M Medical Record Number: [id number] Patient Account Number: [id number] Date of Birth/Sex: Treating RN: Jun 30, 1946 (76 y.o. [name], [name] Primary Care Wynton Hufstetler: [name] Other Clinician: Referring Bassy Fetterly: Treating Quincey Nored/Extender: [name] in Treatment: 2 Clinic Level of Care Assessment Items TOOL 4 Quantity Score X- 1 0 Use when only an EandM is performed on FOLLOW-UP  visit ASSESSMENTS - Nursing Assessment / Reassessment X- 1 10 Reassessment of Co-morbidities (includes updates in patient status) X- 1 5 Reassessment of Adherence to Treatment Plan ASSESSMENTS - Wound and Skin A ssessment / Reassessment X - Simple Wound Assessment / Reassessment - one wound 1 5 '[]'$  - 0 Complex Wound Assessment / Reassessment - multiple wounds '[]'$  - 0 Dermatologic / Skin Assessment (not related to wound area) ASSESSMENTS - Focused Assessment '[]'$  - 0 Circumferential Edema Measurements - multi extremities '[]'$  - 0 Nutritional Assessment / Counseling / Intervention '[]'$  - 0 Lower Extremity Assessment (monofilament, tuning fork, pulses) '[]'$  - 0 Peripheral Arterial Disease Assessment (using hand held doppler) ASSESSMENTS - Ostomy and/or Continence Assessment and Care '[]'$  - 0 Incontinence Assessment and Management '[]'$  - 0 Ostomy Care Assessment and Management (repouching, etc.) PROCESS - Coordination of Care X - Simple Patient / Family Education for ongoing care 1 15 '[]'$  - 0 Complex (extensive) Patient / Family Education for ongoing care X- 1 10 Staff obtains Programmer, systems, Records, T Results / Process Orders est '[]'$  - 0 Staff telephones HHA, Nursing Homes / Clarify orders / etc '[]'$  - 0 Routine Transfer to another Facility (non-emergent condition) '[]'$  - 0 Routine Hospital Admission (non-emergent condition) '[]'$  - 0 New Admissions / Biomedical engineer / Ordering NPWT Apligraf, etc. , '[]'$  - 0 Emergency Hospital Admission (emergent condition) X- 1 10 Simple Discharge Coordination '[]'$  - 0 Complex (extensive) Discharge Coordination PROCESS - Special Needs '[]'$  - 0 Pediatric / Minor Patient Management '[]'$  - 0 Isolation Patient Management '[]'$  - 0 Hearing / Language / Visual special needs '[]'$  - 0 Assessment of Community assistance (transportation, D/C planning, etc.) '[]'$  - 0 Additional assistance / Altered mentation '[]'$  - 0 Support Surface(s) Assessment (bed, cushion, seat,  etc.) INTERVENTIONS - Wound Cleansing / Measurement X - Simple Wound Cleansing - one wound 1 5 '[]'$  - 0 Complex Wound Cleansing - multiple wounds X- 1 5 Wound Imaging (  photographs - any number of wounds) '[]'$  - 0 Wound Tracing (instead of photographs) X- 1 5 Simple Wound Measurement - one wound '[]'$  - 0 Complex Wound Measurement - multiple wounds INTERVENTIONS - Wound Dressings X - Small Wound Dressing one or multiple wounds 1 10 '[]'$  - 0 Medium Wound Dressing one or multiple wounds '[]'$  - 0 Large Wound Dressing one or multiple wounds X- 1 5 Application of Medications - topical '[]'$  - 0 Application of Medications - injection INTERVENTIONS - Miscellaneous '[]'$  - 0 External ear exam '[]'$  - 0 Specimen Collection (cultures, biopsies, blood, body fluids, etc.) '[]'$  - 0 Specimen(s) / Culture(s) sent or taken to Lab for analysis '[]'$  - 0 Patient Transfer (multiple staff / Civil Service fast streamer / Similar devices) '[]'$  - 0 Simple Staple / Suture removal (25 or less) '[]'$  - 0 Complex Staple / Suture removal (26 or more) '[]'$  - 0 Hypo / Hyperglycemic Management (close monitor of Blood Glucose) '[]'$  - 0 Ankle / Brachial Index (ABI) - do not check if billed separately X- 1 5 Vital Signs Has the patient been seen at the hospital within the last three years: Yes Total Score: 90 Level Of Care: New/Established - Level 3 Electronic Signature(s) Signed: 02/10/2022 3:34:17 PM By: Rhae Hammock RN Entered By: Rhae Hammock on 02/10/2022 11:08:23 -------------------------------------------------------------------------------- Encounter Discharge Information Details Patient Name: Date of Service: [name]. 02/10/2022 10:00 A M Medical Record Number: [id number] Patient Account Number: [id number] Date of Birth/Sex: Treating RN: 1946-05-22 (76 y.o. [name], [name] Primary Care Dana Debo: [name] Other Clinician: Referring Breigh Annett: Treating Dontarious Schaum/Extender: [name]  in Treatment: 2 Encounter Discharge Information Items Discharge Condition: Stable Ambulatory Status: Ambulatory Discharge Destination: Home Transportation: Private Auto Accompanied By: self Schedule Follow-up Appointment: Yes Clinical Summary of Care: Patient Declined Electronic Signature(s) Signed: 02/10/2022 3:34:17 PM By: Rhae Hammock RN Entered By: Rhae Hammock on 02/10/2022 11:08:57 -------------------------------------------------------------------------------- Lower Extremity Assessment Details Patient Name: Date of Service: [name]. 02/10/2022 10:00 A M Medical Record Number: [id number] Patient Account Number: [id number] Date of Birth/Sex: Treating RN: Mar 14, 1946 (76 y.o. [name], [name] Primary Care Elic Vencill: [name] Other Clinician: Referring Elbert Spickler: Treating Oneisha Ammons/Extender: Renae Fickle Weeks in Treatment: 2 Electronic Signature(s) Signed: 02/10/2022 3:34:17 PM By: Rhae Hammock RN Signed: 02/10/2022 3:44:56 PM By: Erenest Blank Entered By: Erenest Blank on 02/10/2022 10:32:07 -------------------------------------------------------------------------------- Multi-Disciplinary Care Plan Details Patient Name: Date of Service: [name]. 02/10/2022 10:00 A M Medical Record Number: [id number] Patient Account Number: [id number] Date of Birth/Sex: Treating RN: 10-Dec-1945 (76 y.o. [name], [name] Primary Care Kealy Lewter: [name] Other Clinician: Referring Lathaniel Legate: Treating Kam Kushnir/Extender: [name] in Treatment: 2 Active Inactive Wound/Skin Impairment Nursing Diagnoses: Impaired tissue integrity Knowledge deficit related to ulceration/compromised skin integrity Goals: Patient will have a decrease in wound volume by X% from date: (specify in notes) Date Initiated: 01/27/2022 Target Resolution Date: 02/26/2022 Goal Status: Active Patient/caregiver will verbalize  understanding of skin care regimen Date Initiated: 01/27/2022 Target Resolution Date: 02/26/2022 Goal Status: Active Ulcer/skin breakdown will have a volume reduction of 30% by week 4 Date Initiated: 01/27/2022 Target Resolution Date: 02/25/2022 Goal Status: Active Interventions: Assess patient/caregiver ability to obtain necessary supplies Assess patient/caregiver ability to perform ulcer/skin care regimen upon admission and as needed Assess ulceration(s) every visit Notes: Electronic Signature(s) Signed: 02/10/2022 3:34:17 PM By: Rhae Hammock RN Entered By: Rhae Hammock on 02/10/2022 10:47:24 -------------------------------------------------------------------------------- Pain Assessment Details Patient Name: Date of Service: [name] Harp  P. 02/10/2022 10:00 A M Medical Record Number: [id number] Patient Account Number: [id number] Date of Birth/Sex: Treating RN: [birth year] (76 y.o. [name], [name] Primary Care Kadynce Bonds: [name] Other Clinician: Referring Sirus Labrie: Treating Payton Moder/Extender: Renae Fickle Weeks in Treatment: 2 Active Problems Location of Pain Severity and Description of Pain Patient Has Paino Yes Site Locations Pain Location: Pain Location: Pain in Ulcers Rate the pain. Current Pain Level: 5 Pain Management and Medication Current Pain Management: Electronic Signature(s) Signed: 02/10/2022 3:34:17 PM By: Rhae Hammock RN Signed: 02/10/2022 3:44:56 PM By: Erenest Blank Entered By: Erenest Blank on 02/10/2022 10:31:22 -------------------------------------------------------------------------------- Patient/Caregiver Education Details Patient Name: Date of Service: [name], [name] P. 7/19/2023andnbsp10:00 [place] Record Number: [id number] Patient Account Number: [id number] Date of Birth/Gender: Treating RN: 1945/08/03 (76 y.o. [name], [name] Primary Care Physician: [name] Other  Clinician: Referring Physician: Treating Physician/Extender: [name] in Treatment: 2 Education Assessment Education Provided To: Patient Education Topics Provided Wound/Skin Impairment: Methods: Explain/Verbal Responses: Reinforcements needed, State content correctly Electronic Signature(s) Signed: 02/10/2022 3:34:17 PM By: Rhae Hammock RN Entered By: Rhae Hammock on 02/10/2022 10:47:35 -------------------------------------------------------------------------------- Wound Assessment Details Patient Name: Date of Service: [name]. 02/10/2022 10:00 A M Medical Record Number: [id number] Patient Account Number: [id number] Date of Birth/Sex: Treating RN: 09/02/1945 (76 y.o. 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 Etiology: Date Acquired: 08/26/2021 Wound Open Weeks Of Treatment: 2 Status: Clustered Wound: No Comorbid Asthma, Deep Vein Thrombosis, Hypertension, Peripheral Arterial History: Disease, Hepatitis B, Osteoarthritis, Neuropathy, Received Radiation Photos Wound Measurements Length: (cm) 0.7 Width: (cm) 0.6 Depth: (cm) 1.4 Area: (cm) 0.33 Volume: (cm) 0.462 % Reduction in Area: -364.8% % Reduction in Volume: -550.7% Epithelialization: None Tunneling: Yes Position (o'clock): 6 Maximum Distance: (cm) 3.4 Undermining: No Wound Description Classification: Full Thickness With Exposed Support Structures Wound Margin: Distinct, outline attached Exudate Amount: Large Exudate Type: Serosanguineous Exudate Color: red, brown Foul Odor After Cleansing: Yes Due to Product Use: No Slough/Fibrino Yes Wound Bed Granulation Amount:  Large (67-100%) Exposed Structure Granulation Quality: Red, Pink Fascia Exposed: No Necrotic Amount: Small (1-33%) Fat Layer (Subcutaneous Tissue) Exposed: Yes Necrotic Quality: Adherent Slough Tendon Exposed: No Muscle Exposed: No Joint Exposed: No Bone Exposed: No Treatment Notes Wound #1 (Breast) Wound Laterality: Right Cleanser Soap and Water Discharge Instruction: May shower and wash wound with dial antibacterial soap and water prior to dressing change. Wound Cleanser Discharge Instruction: Cleanse the wound with wound cleanser prior to applying a clean dressing using gauze sponges, not tissue or cotton balls. Peri-Wound Care Topical Primary Dressing Hydrofera Blue Classic Foam Rope Dressing, 9x6 (mm/in) Discharge Instruction: Moisten with saline prior to packing Secondary Dressing Woven Gauze Sponge, Non-Sterile 4x4 in Discharge Instruction: Apply over primary dressing as directed. Zetuvit Plus Silicone Border Dressing 4x4 (in/in) Discharge Instruction: Apply silicone border over primary dressing as directed. Secured With Compression Wrap Compression Stockings Environmental education officer) Signed: 02/10/2022 3:34:17 PM By: Rhae Hammock RN Signed: 02/10/2022 3:44:56 PM By: Erenest Blank Entered By: Erenest Blank on 02/10/2022 10:48:53 -------------------------------------------------------------------------------- Vitals Details Patient Name: Date of Service: [name]. 02/10/2022 10:00 A M Medical Record Number: [id number] Patient Account Number: [id number] Date of Birth/Sex: Treating RN: January 31, 1946 (76 y.o. [name], [name] Primary Care Monte Zinni: [name] Other Clinician: Referring  Ivi Griffith: Treating Viola Placeres/Extender: Renae Fickle Weeks in Treatment: 2 Vital Signs Time Taken: 10:35 Temperature (F): 98.4 Height (in): 64 Pulse (bpm): 64 Weight (lbs): 180 Respiratory Rate (breaths/min): 16 Body Mass Index  (BMI): 30.9 Blood Pressure (mmHg): 112/66 Reference Range: 80 - 120 mg / dl Electronic Signature(s) Signed: 02/10/2022 3:44:56 PM By: Erenest Blank Entered By: Erenest Blank on 02/10/2022 10:36:09

## 2022-02-16 ENCOUNTER — Encounter (HOSPITAL_BASED_OUTPATIENT_CLINIC_OR_DEPARTMENT_OTHER): Payer: Self-pay | Admitting: Obstetrics and Gynecology

## 2022-02-16 ENCOUNTER — Emergency Department (HOSPITAL_BASED_OUTPATIENT_CLINIC_OR_DEPARTMENT_OTHER)
Admission: EM | Admit: 2022-02-16 | Discharge: 2022-02-16 | Disposition: A | Discharge status: Home / Self Care | Payer: Medicare HMO | Attending: Emergency Medicine | Admitting: Emergency Medicine

## 2022-02-16 ENCOUNTER — Emergency Department (HOSPITAL_BASED_OUTPATIENT_CLINIC_OR_DEPARTMENT_OTHER): Payer: Medicare HMO

## 2022-02-16 ENCOUNTER — Other Ambulatory Visit: Payer: Self-pay

## 2022-02-16 DIAGNOSIS — Z7901 Long term (current) use of anticoagulants: Secondary | ICD-10-CM | POA: Insufficient documentation

## 2022-02-16 DIAGNOSIS — R0602 Shortness of breath: Secondary | ICD-10-CM | POA: Diagnosis present

## 2022-02-16 DIAGNOSIS — E039 Hypothyroidism, unspecified: Secondary | ICD-10-CM | POA: Diagnosis not present

## 2022-02-16 DIAGNOSIS — Z7982 Long term (current) use of aspirin: Secondary | ICD-10-CM | POA: Diagnosis not present

## 2022-02-16 DIAGNOSIS — Z853 Personal history of malignant neoplasm of breast: Secondary | ICD-10-CM | POA: Diagnosis not present

## 2022-02-16 DIAGNOSIS — I10 Essential (primary) hypertension: Secondary | ICD-10-CM | POA: Diagnosis not present

## 2022-02-16 DIAGNOSIS — J029 Acute pharyngitis, unspecified: Secondary | ICD-10-CM

## 2022-02-16 DIAGNOSIS — D72829 Elevated white blood cell count, unspecified: Secondary | ICD-10-CM | POA: Insufficient documentation

## 2022-02-16 DIAGNOSIS — J45909 Unspecified asthma, uncomplicated: Secondary | ICD-10-CM | POA: Insufficient documentation

## 2022-02-16 LAB — CBC WITH DIFFERENTIAL/PLATELET
Abs Immature Granulocytes: 0.07 10*3/uL (ref 0.00–0.07)
Basophils Absolute: 0 10*3/uL (ref 0.0–0.1)
Basophils Relative: 0 %
Eosinophils Absolute: 0 10*3/uL (ref 0.0–0.5)
Eosinophils Relative: 0 %
HCT: 35.5 % — ABNORMAL LOW (ref 36.0–46.0)
Hemoglobin: 11.5 g/dL — ABNORMAL LOW (ref 12.0–15.0)
Immature Granulocytes: 1 %
Lymphocytes Relative: 13 %
Lymphs Abs: 1.7 10*3/uL (ref 0.7–4.0)
MCH: 29.5 pg (ref 26.0–34.0)
MCHC: 32.4 g/dL (ref 30.0–36.0)
MCV: 91 fL (ref 80.0–100.0)
Monocytes Absolute: 0.2 10*3/uL (ref 0.1–1.0)
Monocytes Relative: 2 %
Neutro Abs: 11.1 10*3/uL — ABNORMAL HIGH (ref 1.7–7.7)
Neutrophils Relative %: 84 %
Platelets: 268 10*3/uL (ref 150–400)
RBC: 3.9 MIL/uL (ref 3.87–5.11)
RDW: 14.5 % (ref 11.5–15.5)
WBC: 13.1 10*3/uL — ABNORMAL HIGH (ref 4.0–10.5)
nRBC: 0 % (ref 0.0–0.2)

## 2022-02-16 LAB — BASIC METABOLIC PANEL
Anion gap: 11 (ref 5–15)
BUN: 22 mg/dL (ref 8–23)
CO2: 23 mmol/L (ref 22–32)
Calcium: 8.5 mg/dL — ABNORMAL LOW (ref 8.9–10.3)
Chloride: 99 mmol/L (ref 98–111)
Creatinine, Ser: 1.61 mg/dL — ABNORMAL HIGH (ref 0.44–1.00)
GFR, Estimated: 33 mL/min — ABNORMAL LOW (ref >60)
Glucose, Bld: 213 mg/dL — ABNORMAL HIGH (ref 70–99)
Potassium: 4.1 mmol/L (ref 3.5–5.1)
Sodium: 133 mmol/L — ABNORMAL LOW (ref 135–145)

## 2022-02-16 LAB — GROUP A STREP BY PCR: Group A Strep by PCR: NOT DETECTED

## 2022-02-16 MED ORDER — IOHEXOL 300 MG/ML  SOLN
100.0000 mL | Freq: Once | INTRAMUSCULAR | Status: AC | PRN
  Administered 2022-02-16: 50 mL via INTRAVENOUS

## 2022-02-16 MED ORDER — EPINEPHRINE 0.3 MG/0.3ML IJ SOAJ: 0.3000 mg | INTRAMUSCULAR | 0 refills | Status: AC | PRN

## 2022-02-16 MED ORDER — BENZOCAINE-MENTHOL 6-10 MG MT LOZG: 1.0000 | LOZENGE | OROMUCOSAL | 0 refills | Status: DC | PRN

## 2022-02-16 MED ORDER — SODIUM CHLORIDE 0.9 % IV BOLUS
1000.0000 mL | Freq: Once | INTRAVENOUS | Status: AC
Start: 2022-02-16 — End: 2022-02-16
  Administered 2022-02-16: 1000 mL via INTRAVENOUS

## 2022-02-16 MED ORDER — CLINDAMYCIN HCL 300 MG PO CAPS: 300.0000 mg | ORAL_CAPSULE | Freq: Four times a day (QID) | ORAL | 0 refills | Status: AC

## 2022-02-16 MED ORDER — LIDOCAINE VISCOUS HCL 2 % MT SOLN: 15.0000 mL | OROMUCOSAL | 0 refills | Status: DC | PRN

## 2022-02-16 MED ORDER — LIDOCAINE VISCOUS HCL 2 % MT SOLN
15.0000 mL | Freq: Once | OROMUCOSAL | Status: AC
  Administered 2022-02-16: 15 mL via OROMUCOSAL
  Filled 2022-02-16: qty 15

## 2022-02-16 NOTE — Discharge Instructions (Addendum)
Today you were seen in the emergency department for your sore throat.  You were observed for possible allergic reaction but did not appear to have one.  You had a CT scan of your neck which showed ---.   Please follow-up with wound clinic or tomorrow to discuss your antibiotic regimen.  If you are unable to follow-up with them please go to the pharmacy and pick up the clindamycin and take this instead of the linezolid.

## 2022-02-16 NOTE — ED Notes (Signed)
Pt refused Covid test to be done.

## 2022-02-16 NOTE — ED Notes (Signed)
Pt was able to drink Sprite and eat cheese/crackers without complaint.

## 2022-02-16 NOTE — ED Triage Notes (Signed)
Per EMS:  Patient reports to the ER for allergic reaction to vancomycin. Patient went to her PCP and was given epi, decadron '8mg'$  IV, and 35m of NS. Patient reports she previously felt she was having trouble breathing but she is not anymore.

## 2022-02-16 NOTE — ED Provider Notes (Signed)
[facility] EMERGENCY DEPT Provider Note   CSN: [id number] Arrival date & time: 02/16/22  1321     History  Chief Complaint  Patient presents with   Allergic Reaction         [name] is a 76 y.o. female.  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  Patient states that she was put on linezolid (not vancomycin) on 02/10/2022.  Says that she is taking this for a wound on her right chest that has been chronic.  Says that this morning she woke up and was having difficulty swallowing as well as changes in her voice.  She was able to take the medication and then went to urgent care.  They were concerned about possible anaphylactic reaction so she was given 10 mg of IV Decadron and fluids along with 0.3 mg of intramuscular epinephrine at 1pm.  States that her swallowing and voice changes have modestly improved since then but still persisted.  Says that she is feeling mildly short of breath but does not have a rash or any nausea or vomiting or diarrhea.  No dizziness or fainting. Says that her throat hurts as well. Denies any fevers.    Allergic Reaction Presenting symptoms: no rash     Past Medical History:  Diagnosis Date   Anxiety    severe panic attacks   Arthritis    Aseptic meningitis    Asthma    as child   Breast cancer ([place])    right   Chronic anticoagulation 08/04/2015   Collapsed lung    hx of, left; following left thoracentesis for post-operative pleural effusion > 20 years ago   Complication of anesthesia    "hard to put asleep"; awareness under anesthesia > 35 years ago for hysterectomy   Depression    Fibromyalgia    GERD  (gastroesophageal reflux disease)    H/O hiatal hernia    Headache(784.0)    Hepatitis 1990   "from eating at restaurant"   History of antiphospholipid antibody syndrome    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   Hx-TIA (transient ischemic attack)    Hyperlipidemia    Hypertension    Hypothyroidism    Neuromuscular disorder ([place])    Personal history of radiation therapy    Pneumonia    hx of   Soft tissue mass 06/14/2017   3 cm right post axilla same side as previous breast cancer 06/14/17   Stroke [facility])    Thrombosis, 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 Authorizing Provider  ALPRAZolam [name]) 1 MG tablet Take 1 mg by mouth 2 (two) times daily.  12/22/19   [provider]  Ascorbic Acid (VITAMIN C) 1000 MG tablet Take 1,000 mg by mouth daily.    [provider]  aspirin 81 MG tablet Take 81 mg by mouth every evening.    [provider]  Calcium Carb-Cholecalciferol (CALCIUM-VITAMIN D) 600-400 MG-UNIT TABS Take 1 tablet by mouth daily.  [provider]  clindamycin (CLEOCIN) 300 MG capsule Take 1 capsule (300 mg total) by mouth 4 (four) times daily for 10 days. 02/16/22 02/26/22  Fransico Meadow, MD  Cyanocobalamin (VITAMIN B 12) 500 MCG TABS Take 500 mcg by mouth daily.    [provider]  fluticasone (FLONASE) 50 MCG/ACT nasal spray Place 2 sprays into the nose daily as needed for allergies.  10/16/14   [provider]  HYDROcodone-acetaminophen (NORCO/VICODIN) 5-325 MG tablet Take 1 tablet by mouth every 6 (six) hours as needed for moderate pain. 09/15/21   Cornett, Marcello Moores, MD  levothyroxine (SYNTHROID, LEVOTHROID) 112 MCG tablet Take 112 mcg by mouth See admin instructions. Take 112 mcg by mouth daily on Sun, Tues, Wed,  Thurs, Sat    [provider]  levothyroxine (SYNTHROID, LEVOTHROID) 125 MCG tablet Take 125 mcg by mouth See admin instructions. Take 125 mcg by mouth on Monday and Friday 06/24/16   [provider]  magnesium oxide (MAG-OX) 400 MG tablet Take 400 mg by mouth daily.    [provider]  metoprolol tartrate (LOPRESSOR) 25 MG tablet Take 12.5 mg by mouth 2 (two) times daily.    [provider]  omeprazole (PRILOSEC) 20 MG capsule Take 20 mg by mouth daily.  02/06/13   [provider]  pravastatin (PRAVACHOL) 80 MG tablet Take 80 mg by mouth daily.    [provider]  sertraline (ZOLOFT) 100 MG tablet Take 100 mg by mouth daily. 01/20/20   [provider]  warfarin (COUMADIN) 5 MG tablet Take 5 mg daily except on Mondays: take 2.5 mg (1/2 tablet) 12/29/21   Nicholas Lose, MD      Allergies    Bee venom, Hornet venom, Reglan [metoclopramide], Vitamin k, Baclofen, Cephalexin, Benadryl [diphenhydramine hcl], Ciprofloxacin, Crestor [rosuvastatin calcium], Cymbalta [duloxetine hcl], Daypro [oxaprozin], Doxycycline, Levofloxacin, Sulfonamide derivatives, and Zocor [simvastatin]    Review of Systems   Review of Systems  Constitutional:  Negative for chills and fever.  Musculoskeletal:  Negative for neck pain and neck stiffness.  Skin:  Positive for wound. Negative for rash.    Physical Exam Updated Vital Signs BP (!) 105/45   Pulse 70   Temp 98.1 F (36.7 C)   Resp 15   SpO2 98%  Physical Exam Vitals and nursing note reviewed.  Constitutional:      General: She is not in acute distress.    Appearance: She is well-developed. She is not ill-appearing.  HENT:     Head: Normocephalic and atraumatic.     Right Ear: External ear normal.     Left Ear: External ear normal.     Nose: Nose normal.     Mouth/Throat:     Mouth: Mucous membranes are moist.     Pharynx: Oropharyngeal exudate and posterior oropharyngeal erythema present.      Comments: Status post tonsillectomy Eyes:     Extraocular Movements: Extraocular movements intact.     Conjunctiva/sclera: Conjunctivae normal.     Pupils: Pupils are equal, round, and reactive to light.  Cardiovascular:     Rate and Rhythm: Normal rate and regular rhythm.     Pulses: Normal pulses.     Heart sounds: Normal heart sounds. No murmur heard. Pulmonary:     Effort: Pulmonary effort is normal. No respiratory distress.     Breath sounds: Normal breath sounds. No stridor. No wheezing or rales.  Abdominal:     General: Abdomen is flat. There is no distension.  Palpations: Abdomen is soft. There is no mass.     Tenderness: There is no abdominal tenderness. There is no guarding.  Musculoskeletal:        General: No swelling.     Cervical back: Normal range of motion and neck supple. No rigidity.  Lymphadenopathy:     Cervical: No cervical adenopathy.  Skin:    General: Skin is warm and dry.     Capillary Refill: Capillary refill takes less than 2 seconds.     Comments: Drain in right chest wall.  Minimal erythema.  No drainage noted.  Neurological:     General: No focal deficit present.     Mental Status: She is alert and oriented to person, place, and time. Mental status is at baseline.  Psychiatric:        Mood and Affect: Mood normal.     ED Results / Procedures / Treatments   Labs (all labs ordered are listed, but only abnormal results are displayed) Labs Reviewed  BASIC METABOLIC PANEL - Abnormal; Notable for the following components:      Result Value   Sodium 133 (*)    Glucose, Bld 213 (*)    Creatinine, Ser 1.61 (*)    Calcium 8.5 (*)    GFR, Estimated 33 (*)    All other components within normal limits  CBC WITH DIFFERENTIAL/PLATELET - Abnormal; Notable for the following components:   WBC 13.1 (*)    Hemoglobin 11.5 (*)    HCT 35.5 (*)    Neutro Abs 11.1 (*)    All other components within normal limits  GROUP A STREP BY PCR  RESP PANEL BY RT-PCR  (FLU A&B, COVID) [id number]    EKG None  Radiology No results found.  Procedures Procedures    Medications Ordered in ED Medications  lidocaine (XYLOCAINE) 2 % viscous mouth solution 15 mL (15 mLs Mouth/Throat Given 02/16/22 1508)    ED Course/ Medical Decision Making/ A&P Clinical Course as of 02/16/22 1548  Tue Feb 16, 2022  1528 Spoke with pharmacy about possible antibiotic options with the concern for possible linezolid reaction and other abx reactions. They recommend clindamycin for remainder of her 14 day course.  [RP]  9833 Signed out to Dr. Pearline Cables.  Awaiting CT.  Labs been sent which showed mild leukocytosis with BMP and RVP pending.  Patient states that she feels much better after the lidocaine solution. 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  Unclear if this was actually an allergic reaction as patient had no other system involvement aside from sore throat, voice changes, and difficulty swallowing.  Does not have stridor or wheezing on exam after she had received epinephrine and steroids.  Does appear to have erythema and exudates which is likely more consistent with pharyngitis than allergic reaction. Decadron will likely help with this over the next day. With her subjective difficulty swallowing we will obtain CT scan of the neck to evaluate for deeper space  infection though feel this is unlikely.  We will also monitor for short period of time to ensure that she does not develop signs of allergic reaction such as rash, GI symptoms, or hypotension.   We will have the patient follow-up with her wound care clinic tomorrow regarding antibiotics.  Feel that it is highly unlikely that the patient had an anaphylactic reaction to linezolid as she has taken this medication a full course before and has already been on this medication for several days before any symptoms occurring.  Patient reportedly had an adverse reaction to IV contrast which was localized to her breast years ago.  Has obtained multiple contrasted CT scan since then without allergic reaction so feel it is safe to do so today.  Amount and/or Complexity of Data Reviewed Labs: ordered. Radiology: ordered.  Risk Prescription drug management.            Final Clinical Impression(s) / ED Diagnoses Final diagnoses:  Pharyngitis, unspecified etiology    Rx / DC Orders ED Discharge Orders          Ordered    clindamycin (CLEOCIN) 300 MG capsule  4 times daily        02/16/22 1540              Fransico Meadow, MD 02/16/22 1549

## 2022-02-16 NOTE — ED Provider Notes (Signed)
  Provider Note MRN:  [id number]  Arrival date & time: 02/16/22    ED Course and Medical Decision Making  Assumed care from [place] at shift change.  See note from prior team for complete details, in brief:  76 yo female to ED from UC with concern for allergic rxn to abx She was on zyvox for chest wall cellulitis a/w recent surgery Had throat discomfort and went to UC, concern for anaphylaxis and was given epi/steroids Pt did not endorse rash, nausea or vomiting Low suspicion for true anaphylactic reaction  Plan per prior physician   Pt re-assessed, feeling much better Tolerating PO w/o difficulty No stridor, no drooling or trismus On ambient air Favor viral syndrome / pharyngitis Strep neg, CT soft tissue neck reviewed She is not septic Abx adjusted by prior team Advised f/u with pcp Given allergy f/u and epi pen rx  The patient improved significantly and was discharged in stable condition. Detailed discussions were had with the patient regarding current findings, and need for close f/u with PCP or on call doctor. The patient has been instructed to return immediately if the symptoms worsen in any way for re-evaluation. Patient verbalized understanding and is in agreement with current care plan. All questions answered prior to discharge.    Procedures  Final Clinical Impressions(s) / ED Diagnoses     ICD-10-CM   1. Pharyngitis, unspecified etiology  J02.9     2. Sore throat  J02.9       ED Discharge Orders          Ordered    clindamycin (CLEOCIN) 300 MG capsule  4 times daily        02/16/22 1540    lidocaine (XYLOCAINE) 2 % solution  As needed        02/16/22 1823    benzocaine-menthol (CHLORAEPTIC) 6-10 MG lozenge  As needed        02/16/22 1823              Discharge Instructions      Today you were seen in the emergency department for your sore throat.  You were observed for possible allergic reaction but did not appear to have one.  You had a CT  scan of your neck which showed ---.   Please follow-up with wound clinic or tomorrow to discuss your antibiotic regimen.  If you are unable to follow-up with them please go to the pharmacy and pick up the clindamycin and take this instead of the linezolid.       Jeanell Sparrow, DO 02/16/22 1826

## 2022-02-17 ENCOUNTER — Encounter (HOSPITAL_BASED_OUTPATIENT_CLINIC_OR_DEPARTMENT_OTHER): Payer: Medicare HMO | Admitting: Internal Medicine

## 2022-02-17 DIAGNOSIS — L98492 Non-pressure chronic ulcer of skin of other sites with fat layer exposed: Secondary | ICD-10-CM | POA: Diagnosis not present

## 2022-02-17 NOTE — Progress Notes (Signed)
NYANA, HAREN ([id number]) Visit Report for 02/17/2022 HPI Details Patient Name: Date of Service: [name], [name] 02/17/2022 10:00 A M Medical Record Number: [id number] Patient Account Number: [id number] Date of Birth/Sex: Treating RN: [birth year] (76 y.o. [name], [name] Primary Care Provider: [name] Other Clinician: Referring Provider: Treating Provider/Extender: Barkley Bruns in Treatment: 3 History of Present Illness HPI Description: 01-27-2022 upon evaluation today patient presents for initial evaluation here in our clinic concerning issues she has been having with her right breast. She had an area of calcium buildup where she had actual staples that were being pushed out that were noted from her previous breast surgery which was roughly 23 years ago when she had cancer. At that point she had also undergone radiation therapy. 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 Nonetheless after her most recent surgery in February to remove the staples and the calcium deposit this has started to build back up due to the damage to the breast tissue. She was seen initially by Dr. Brantley Stage. Eventually he referred her to Dr. Rebekah Chesterfield to see about a mastectomy to take care of the issue. With that being said this was something that was good to be reserved just for worst-case scenario in fact they were a little bit concerned about her reaction considering her blood thinners and the fact that she may not do as well and surgery is what they would like to see with all the other medical problems that she has. For that reason they referred her to [name] in order to see what we can do from a healing standpoint. 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 02-03-2022 upon evaluation today patient appears to be doing okay in regard to her wound on the breast. Fortunately I do not see any signs of worsening. Unfortunately I do believe that she is still having some discomfort also there was some misunderstanding about how to do the dressing which I think will make it a little bit easier she should be putting it in before waiting it and then after its in and situated she can wet this. I think that is going to be a lot easier for her than what she has been trying to do. 02-10-2022 upon evaluation today patient appears to be doing about the same in regard to her wound. She has been tolerating the dressing changes without complication. We are actually starting her on linezolid today actually had to apply to get this approved by her insurance I am hopeful that this will be affordable for her now it was going to cost her way too much before. With that being said I do believe that she is headed in the right direction which is great news. No fevers, chills, nausea, vomiting, or diarrhea. 7/26; right breast apparently secondary to surgery earlier this year for protruding staples and underlying traumatic calcifications. She has a nonhealing wound with some depth and tunneling. She has been using Hydrofera Blue with some improvement in measurements today. Nevertheless she has a wound VAC and we went ahead and applied this. I am not certain whether she is going to be eligible for home health but her  staff are certainly going to work on it [employer]) Signed: 02/17/2022 4:38:07 PM By: Linton Ham MD Entered By: Linton Ham on 02/17/2022 11:16:44 -------------------------------------------------------------------------------- Physical Exam Details Patient Name: Date of Service: [name] 02/17/2022 10:00 A M Medical  Record Number: [id number] Patient Account Number: [id number] Date of Birth/Sex: Treating RN: 09-24-1945 (76 y.o. [name], [name] Primary Care Provider: [name] Other Clinician: Referring Provider: Treating Provider/Extender: Barkley Bruns in Treatment: 3 Constitutional Sitting or standing Blood Pressure is within target range for patient.. Pulse regular and within target range for patient.[name] Respirations regular, non-labored and within target range.. Temperature is normal and within the target range for the patient.[name] Appears in no distress. Notes Wound exam; right breast divot with a wound in the lower part of this. There is depth but no palpable calcification with the probe. Also a very significant tunnel at 7:00 at 2.8 cm although this was apparently 3.4 cm last week there is no evidence of infection no crepitus no purulence Electronic Signature(s) Signed: 02/17/2022 4:38:07 PM By: Linton Ham MD Entered By: Linton Ham on 02/17/2022 11:17:37 -------------------------------------------------------------------------------- Physician Orders Details Patient Name: Date of Service: [name]. 02/17/2022 10:00 A M Medical Record Number: [id number] Patient Account Number: [id number] Date of Birth/Sex: Treating RN: 11/23/1945 (76 y.o. [name] Primary Care Provider: [name] Other Clinician: Referring Provider: Treating Provider/Extender: Barkley Bruns in Treatment: 3 Verbal / Phone Orders: No Diagnosis Coding ICD-10 Coding Code Description T81.31XA Disruption of external operation (surgical) wound, not elsewhere classified, initial encounter L98.492 Non-pressure chronic ulcer of skin of other sites with fat layer exposed E83.59 Other disorders of calcium metabolism L59.8 Other specified disorders of the skin and subcutaneous tissue related to radiation D68.61 Antiphospholipid syndrome I10  Essential (primary) hypertension I25.10 Atherosclerotic heart disease of native coronary artery without angina pectoris Follow-up Appointments ppointment in 1 week. Margarita Grizzle and Allayne Butcher Room # 9 Wednesday Return A Nurse Visit: - Friday and Monday Bathing/ Shower/ Hygiene May shower with protection but do not get wound dressing(s) wet. Negative Presssure Wound Therapy Wound #1 Right Breast Wound Vac to wound continuously at 151m/hg pressure Sterile Gauze Packing Home Health Wound #1 Right Breast Admit to Home Health for wound care. May utilize formulary equivalent dressing for wound treatment orders unless otherwise specified. New wound care orders this week; continue Home Health for wound care. May utilize formulary equivalent dressing for wound treatment orders unless otherwise specified. Dressing changes to be completed by [facility] Monday / Wednesday / Friday [facility] Wound #1 - Breast Wound Laterality: Right Cleanser: Soap and Water 1 x Per DGUY/40Days Discharge Instructions: May shower and wash wound with dial antibacterial soap and water prior to dressing change. Cleanser: Wound Cleanser (Generic) 1 x Per Day/15 Days Discharge Instructions: Cleanse the wound with wound cleanser prior to applying a clean dressing using gauze sponges, not tissue or cotton balls. Peri-Wound Care: Skin Prep 1 x Per DHKV/42Days Discharge Instructions: Use skin prep as directed Peri-Wound Care: wound vac 1 x Per DVZD/63Days Electronic Signature(s) Signed: 02/17/2022 4:38:07 PM By: RLinton HamMD Signed: 02/17/2022 4:47:33 PM By: PSharyn CreamerRN, BSN Entered By: PSharyn Creameron 02/17/2022 14:19:11 -------------------------------------------------------------------------------- Problem List Details Patient Name: Date of Service: HChrystine [name] 02/17/2022 10:00 A M Medical Record Number: [id number] Account  Number: [id number] of Birth/Sex: Treating RN: 1January 13, 1947(76y.o. F) BHollie Salk  [name] Primary Care Provider: [name] Other Clinician: Referring Provider: Treating Provider/Extender: Barkley Bruns in Treatment: 3 Active Problems ICD-10 Encounter Code Description Active Date MDM Diagnosis T81.31XA Disruption of external operation (surgical) wound, not elsewhere classified, 01/27/2022 No Yes initial encounter L98.492 Non-pressure chronic ulcer of skin of other sites with fat layer exposed 01/27/2022 No Yes E83.59 Other disorders of calcium metabolism 01/27/2022 No Yes L59.8 Other specified disorders of the skin and subcutaneous tissue related to 01/27/2022 No Yes radiation D68.61 Antiphospholipid syndrome 01/27/2022 No Yes I10 Essential (primary) hypertension 01/27/2022 No Yes I25.10 Atherosclerotic heart disease of native coronary artery without angina pectoris 01/27/2022 No Yes Inactive Problems Resolved Problems Electronic Signature(s) Signed: 02/17/2022 4:38:07 PM By: Linton Ham MD Entered By: Linton Ham on 02/17/2022 11:15:45 -------------------------------------------------------------------------------- Progress Note Details Patient Name: Date of Service: [name] 02/17/2022 10:00 A M Medical Record Number: [id number] Patient Account Number: [id number] Date of Birth/Sex: Treating RN: 14-Jul-1946 (76 y.o. [name], [name] Primary Care Provider: Other Clinician: [name] Referring Provider: Treating Provider/Extender: Barkley Bruns in Treatment: 3 Subjective History of Present Illness (HPI) 01-27-2022 upon evaluation today patient presents for initial evaluation here in our clinic concerning issues she has been having with her right breast. She had an area of calcium buildup where she had actual staples that were being pushed out that were noted from her previous breast surgery which was roughly 23  years ago when she had cancer. At that point she had also undergone radiation therapy. 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 Nonetheless after her most recent surgery in February to remove the staples and the calcium deposit this has started to build back up due to the damage to the breast tissue. She was seen initially by Dr. Brantley Stage. Eventually he referred her to Dr. Rebekah Chesterfield to see about a mastectomy to take care of the issue. With that being said this was something that was good to be reserved just for worst-case scenario in fact they were a little bit concerned about her reaction considering her blood thinners and the fact that she may not do as well and surgery is what they would like to see with all the other medical problems that she has. For that reason they referred her to [name] in order to see what we can do from a healing standpoint. 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 02-03-2022 upon evaluation today patient appears to be doing okay in regard to her wound on the breast. Fortunately I do not see any signs of worsening. Unfortunately I do believe that she is still having some discomfort also there was some misunderstanding about how to do the dressing which I think will make it a little bit easier she should be putting it in before waiting it and then after its in and situated she can wet this. I think that is going to be a lot easier for her than what she has been trying to do. 02-10-2022 upon evaluation today patient appears to be doing about the same in regard to her wound. She has been tolerating the dressing changes without complication.  We are actually starting her  on linezolid today actually had to apply to get this approved by her insurance I am hopeful that this will be affordable for her now it was going to cost her way too much before. With that being said I do believe that she is headed in the right direction which is great news. No fevers, chills, nausea, vomiting, or diarrhea. 7/26; right breast apparently secondary to surgery earlier this year for protruding staples and underlying traumatic calcifications. She has a nonhealing wound with some depth and tunneling. She has been using Hydrofera Blue with some improvement in measurements today. Nevertheless she has a wound VAC and we went ahead and applied this. I am not certain whether she is going to be eligible for home health but her staff are certainly going to work on it Objective Constitutional Sitting or standing Blood Pressure is within target range for patient.. Pulse regular and within target range for patient.[name] Respirations regular, non-labored and within target range.. Temperature is normal and within the target range for the patient.[name] Appears in no distress. Vitals Time Taken: 10:10 AM, Height: 64 in, Weight: 180 lbs, BMI: 30.9, Temperature: 98.2 F, Pulse: 69 bpm, Respiratory Rate: 18 breaths/min, Blood Pressure: 131/75 mmHg. General Notes: Wound exam; right breast divot with a wound in the lower part of this. There is depth but no palpable calcification with the probe. Also a very significant tunnel at 7:00 at 2.8 cm although this was apparently 3.4 cm last week there is no evidence of infection no crepitus no purulence Integumentary (Hair, Skin) Wound #1 status is Open. Original cause of wound was Surgical Injury. The date acquired was: 08/26/2021. The wound has been in treatment 3 weeks. The wound is located on the Right Breast. The wound measures 0.7cm length x 0.9cm width x 0.8cm depth; 0.495cm^2 area and 0.396cm^3 volume. There is Fat Layer (Subcutaneous  Tissue) exposed. There is no undermining noted, however, there is tunneling at 9:00 with a maximum distance of 2.8cm. There is a large amount of serosanguineous drainage noted. Foul odor after cleansing was noted. The wound margin is distinct with the outline attached to the wound base. There is large (67-100%) red, pink granulation within the wound bed. There is a small (1-33%) amount of necrotic tissue within the wound bed including Adherent Slough. 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 Margarita Grizzle and Allayne Butcher Room # 9 Wednesday Nurse Visit: - Friday and Monday Bathing/ Shower/ Hygiene: May shower with protection but do not get wound dressing(s) wet. Negative Presssure Wound Therapy: Wound #1 Right Breast: Wound Vac to wound continuously at 194m/hg pressure Sterile Gauze Packing Home Health: Wound #1 Right Breast: Admit to Home Health for wound care. May utilize formulary equivalent dressing for wound treatment orders unless otherwise specified. New wound care orders this week; continue Home Health for wound care. May utilize formulary equivalent dressing for wound treatment orders unless otherwise specified. Dressing changes to be completed by [facility] Monday / Wednesday / Friday [facility]: - Breast Wound Laterality: Right Cleanser: Soap and Water 1 x Per DOZH/08Days Discharge Instructions: May shower  and wash wound with dial antibacterial soap and  water prior to dressing change. Cleanser: Wound Cleanser (Generic) 1 x Per Day/15 Days Discharge Instructions: Cleanse the wound with wound cleanser prior to applying a clean dressing using gauze sponges, not tissue or cotton balls. Peri-Wound Care: Skin Prep 1 x Per KZS/01 Days Discharge Instructions: Use skin prep as directed Peri-Wound Care: wound vac 1 x Per Day/15 Days 1. We went ahead and applied the wound VAC #2 at this point I am uncertain whether she is going to be eligible for home health but we will certainly look into it otherwise she is probably going to have to come here twice a week to have this replaced Electronic Signature(s) Signed: 02/17/2022 4:38:07 PM By: Linton Ham MD Entered By: Linton Ham on 02/17/2022 11:18:56 -------------------------------------------------------------------------------- SuperBill Details Patient Name: Date of Service: [name] 02/17/2022 Medical Record Number: [id number] Patient Account Number: [id number] Date of Birth/Sex: Treating RN: 14-Jun-1946 (76 y.o. [name], [name] Primary Care Provider: [name] Other Clinician: Referring Provider: Treating Provider/Extender: Barkley Bruns in Treatment: 3 Diagnosis Coding ICD-10 Codes Code Description T81.31XA Disruption of external operation (surgical) wound, not elsewhere classified, initial encounter L98.492 Non-pressure chronic ulcer of skin of other sites with fat layer exposed E83.59 Other disorders of calcium metabolism L59.8 Other specified disorders of the skin and subcutaneous tissue related to radiation D68.61 Antiphospholipid syndrome I10 Essential (primary) hypertension I25.10 Atherosclerotic heart disease of native coronary artery without angina pectoris Facility Procedures CPT4 Code: [id number] Description: 06237 - WOUND VAC-50 SQ CM OR LESS Modifier: Quantity: 1 Physician Procedures : CPT4 Code Description Modifier [id number]  [id number] - WC PHYS LEVEL 3 - EST PT 1 ICD-10 Diagnosis Description L98.492 Non-pressure chronic ulcer of skin of other sites with fat layer exposed T81.31XA Disruption of external operation (surgical) wound, not  elsewhere classified, initial encounter Quantity: Electronic Signature(s) Signed: 02/17/2022 4:38:07 PM By: Linton Ham MD Entered By: Linton Ham on 02/17/2022 11:19:14

## 2022-02-19 ENCOUNTER — Encounter (HOSPITAL_BASED_OUTPATIENT_CLINIC_OR_DEPARTMENT_OTHER): Payer: Medicare HMO | Admitting: Internal Medicine

## 2022-02-19 DIAGNOSIS — L98492 Non-pressure chronic ulcer of skin of other sites with fat layer exposed: Secondary | ICD-10-CM | POA: Diagnosis not present

## 2022-02-19 NOTE — Progress Notes (Signed)
TYFFANY, WALDROP ([id number]) Visit Report for 02/19/2022 Arrival Information Details Patient Name: Date of Service: [name], [name] 02/19/2022 10:15 A M Medical Record Number: [id number] Patient Account Number: [id number] Date of Birth/Sex: Treating RN: 1945-07-27 (76 y.o. F) Primary Care Kanyah Matsushima: [name] Other Clinician: Referring Troi Florendo: Treating Maclin Guerrette/Extender: Sabino Dick in Treatment: 3 Visit Information History Since Last Visit Added or deleted any medications: No Patient Arrived: Ambulatory Any new allergies or adverse reactions: No Arrival Time: 10:44 Had a fall or experienced change in No Accompanied By: self activities of daily living that may affect Transfer Assistance: None risk of falls: Patient Identification Verified: Yes Signs or symptoms of abuse/neglect since last visito No Secondary Verification Process Completed: Yes Hospitalized since last visit: No Patient Requires Transmission-Based Precautions: No Implantable device outside of the clinic excluding No Patient Has Alerts: Yes cellular tissue based products placed in the center Patient Alerts: Patient on Blood Thinner since last visit: Has Dressing in Place as Prescribed: Yes Pain Present Now: Yes Electronic Signature(s) Signed: 02/19/2022 12:50:54 PM By: Erenest Blank Entered By: Erenest Blank on 02/19/2022 10:46:50 -------------------------------------------------------------------------------- Encounter Discharge Information Details Patient Name: Date of Service: [name] 02/19/2022 10:15 A M Medical Record Number: [id number] Patient Account Number: [id number] Date of Birth/Sex: Treating RN: 13-Jun-1946 (76 y.o. F) Primary Care Marion Seese: [name] Other Clinician: Referring Nehan Flaum: Treating Hoover Grewe/Extender: Sabino Dick in Treatment: 3 Encounter Discharge Information Items Discharge Condition:  Stable Ambulatory Status: Ambulatory Discharge Destination: Home Transportation: Private Auto Accompanied By: self Schedule Follow-up Appointment: Yes Clinical Summary of Care: Electronic Signature(s) Signed: 02/19/2022 12:50:54 PM By: Erenest Blank Entered By: Erenest Blank on 02/19/2022 12:48:31 -------------------------------------------------------------------------------- Negative Pressure Wound Therapy Maintenance (NPWT) Details Patient Name: Date of Service: [name], [name] 02/19/2022 10:15 A M Medical Record Number: [id number] Patient Account Number: [id number] Date of Birth/Sex: Treating RN: 12-31-1945 (76 y.o. F) Primary Care Betzayda Braxton: [name] Other Clinician: Referring Kory Rains: Treating Momo Braun/Extender: Truddie Hidden Weeks in Treatment: 3 NPWT Maintenance Performed for: Wound #1 Right Breast Performed By: Erenest Blank, Type: VAC System Coverage Size (sq cm): 0.63 Pressure Type: Constant Pressure Setting: 125 mmHG Drain Type: None Primary Contact: Other : Sponge/Dressing Type: Gauze Date Initiated: 02/17/2022 Dressing Removed: No Quantity of Sponges/Gauze Removed: 1 gauze, black foam Canister Changed: No Dressing Reapplied: Yes Quantity of Sponges/Gauze Inserted: 1 gauze, black foam Respones T Treatment: o tolerated well Days On NPWT : 3 Electronic Signature(s) Signed: 02/19/2022 12:50:54 PM By: Erenest Blank Entered By: Erenest Blank on 02/19/2022 12:47:06 -------------------------------------------------------------------------------- Patient/Caregiver Education Details Patient Name: Date of Service: [name] 7/28/2023andnbsp10:15 [place] Record Number: [id number] Patient Account Number: [id number] Date of Birth/Gender: Treating RN: [birth year] (76 y.o. F) Primary Care Physician: [name] Other Clinician: Erenest Blank Referring Physician: Treating Physician/Extender: Sabino Dick in Treatment: 3 Education Assessment Education Provided To: Patient Education Topics Provided Electronic Signature(s) Signed: 02/19/2022 12:50:54 PM By: Erenest Blank Entered By: Erenest Blank on 02/19/2022 12:48:17 -------------------------------------------------------------------------------- Wound Assessment Details Patient Name: Date of Service: [name], [name] 02/19/2022 10:15 A M Medical Record Number: [id number] Patient Account Number: [id number] Date of Birth/Sex: Treating RN: Jul 16, 1946 (76 y.o. F) Primary Care Avigayil Ton: [name] Other Clinician: Referring Kaydon Creedon: Treating Ilo Beamon/Extender: Truddie Hidden Weeks in Treatment: 3 Wound Status Wound Number: 1 Primary Etiology: Open Surgical Wound Wound Location: Right Breast Secondary Etiology: Calciphylaxis Wounding Event: Surgical Injury Wound Status: Open Date Acquired: 08/26/2021 Weeks Of Treatment: 3 Clustered  Wound: No Wound Measurements Length: (cm) 0.7 Width: (cm) 0.9 Depth: (cm) 0.8 Area: (cm) 0.495 Volume: (cm) 0.396 % Reduction in Area: -597.2% % Reduction in Volume: -457.7% Wound Description Classification: Full Thickness With Exposed Support Structure Exudate Amount: Large Exudate Type: Serosanguineous Exudate Color: red, brown s Treatment Notes Wound #1 (Breast) Wound Laterality: Right Cleanser Soap and Water Discharge Instruction: May shower and wash wound with dial antibacterial soap and water prior to dressing change. Wound Cleanser Discharge Instruction: Cleanse the wound with wound cleanser prior to applying a clean dressing using gauze sponges, not tissue or cotton balls. Peri-Wound Care Skin Prep Discharge Instruction: Use skin prep as directed wound vac Topical Primary Dressing Secondary Dressing Secured With Compression Wrap Compression Stockings Add-Ons Electronic Signature(s) Signed: 02/19/2022 12:50:54 PM By: Erenest Blank Entered By: Erenest Blank on 02/19/2022 10:49:37 -------------------------------------------------------------------------------- Vitals Details Patient Name: Date of Service: [name]. 02/19/2022 10:15 A M Medical Record Number: [id number] Patient Account Number: [id number] Date of Birth/Sex: Treating RN: 01-Nov-1945 (76 y.o. F) Primary Care Jeovanny Cuadros: Other Clinician: [name] Referring Timothy Trudell: Treating Ilyanna Baillargeon/Extender: Sabino Dick in Treatment: 3 Vital Signs Time Taken: 10:48 Temperature (F): 98.2 Height (in): 64 Pulse (bpm): 60 Weight (lbs): 180 Respiratory Rate (breaths/min): 18 Body Mass Index (BMI): 30.9 Blood Pressure (mmHg): 138/84 Reference Range: 80 - 120 mg / dl Electronic Signature(s) Signed: 02/19/2022 12:50:54 PM By: Erenest Blank Entered By: Erenest Blank on 02/19/2022 10:49:21

## 2022-02-19 NOTE — Progress Notes (Signed)
IDALIE, CANTO ([id number]) Visit Report for 02/19/2022 SuperBill Details Patient Name: Date of Service: [name], [name] 02/19/2022 Medical Record Number: [id number] Patient Account Number: [id number] Date of Birth/Sex: Treating RN: Aug 02, 1945 (76 y.o. 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

## 2022-02-22 ENCOUNTER — Encounter (HOSPITAL_BASED_OUTPATIENT_CLINIC_OR_DEPARTMENT_OTHER): Payer: Medicare HMO | Admitting: Internal Medicine

## 2022-02-22 DIAGNOSIS — L98492 Non-pressure chronic ulcer of skin of other sites with fat layer exposed: Secondary | ICD-10-CM | POA: Diagnosis not present

## 2022-02-22 NOTE — Progress Notes (Signed)
[name], [name] ([id number]) Visit Report for 02/22/2022 Arrival Information Details Patient Name: Date of Service: [name], [name] 02/22/2022 3:30 PM Medical Record Number: [id number] Patient Account Number: [id number] Date of Birth/Sex: Treating RN: [birth year] (76 y.o. Sue Lush Primary Care Aber[place] Hafen: [name] Other Clinician: Referring Lester Platas: Treating Virlee Stroschein/Extender: Sabino Dick in Treatment: 3 Visit Information History Since Last Visit Added or deleted any medications: No Patient Arrived: Ambulatory Any new allergies or adverse reactions: No Arrival Time: 15:04 Had a fall or experienced change in No Accompanied By: self activities of daily living that may affect Transfer Assistance: None risk of falls: Patient Identification Verified: Yes Signs or symptoms of abuse/neglect since last visito No Secondary Verification Process Completed: Yes Hospitalized since last visit: No Patient Requires Transmission-Based Precautions: No Implantable device outside of the clinic excluding No Patient Has Alerts: Yes cellular tissue based products placed in the center Patient Alerts: Patient on Blood Thinner since last visit: Has Dressing in Place as Prescribed: Yes Pain Present Now: Yes Electronic Signature(s) Signed: 02/22/2022 4:28:59 PM By: Erenest Blank Entered By: Erenest Blank on 02/22/2022 15:05:05 -------------------------------------------------------------------------------- Encounter Discharge Information Details Patient Name: Date of Service: [name] 02/22/2022 3:30 PM Medical Record Number: [id number] Patient Account Number: [id number] Date of Birth/Sex: Treating RN: 24-Apr-1946 (76 y.o. Sue Lush Primary Care Nyeemah Jennette: [name] Other Clinician: Referring Gulianna Hornsby: Treating Derius Ghosh/Extender: Sabino Dick in Treatment: 3 Encounter Discharge Information Items Discharge  Condition: Stable Ambulatory Status: Ambulatory Discharge Destination: Home Transportation: Private Auto Accompanied By: self Schedule Follow-up Appointment: Yes Clinical Summary of Care: Electronic Signature(s) Signed: 02/22/2022 4:28:59 PM By: Erenest Blank Entered By: Erenest Blank on 02/22/2022 16:28:03 -------------------------------------------------------------------------------- Negative Pressure Wound Therapy Maintenance (NPWT) Details Patient Name: Date of Service: [name], [name] 02/22/2022 3:30 PM Medical Record Number: [id number] Patient Account Number: [id number] Date of Birth/Sex: Treating RN: March 11, 1946 (76 y.o. Sue Lush Primary Care Afnan Emberton: [name] Other Clinician: Referring Keevin Panebianco: Treating Helana Macbride/Extender: Truddie Hidden Weeks in Treatment: 3 NPWT Maintenance Performed for: Wound #1 Right Breast Performed By: Erenest Blank, Type: VAC System Coverage Size (sq cm): 0.63 Pressure Type: Constant Pressure Setting: 125 mmHG Drain Type: None Primary Contact: Non-Adherent Sponge/Dressing Type: Gauze Date Initiated: 02/17/2022 Dressing Removed: No Quantity of Sponges/Gauze Removed: 1 gauze, black foam Canister Changed: No Dressing Reapplied: Yes Quantity of Sponges/Gauze Inserted: 1 gauze, black foam Respones T Treatment: o tolerated well Days On NPWT : 6 Electronic Signature(s) Signed: 02/22/2022 4:28:59 PM By: Erenest Blank Entered By: Erenest Blank on 02/22/2022 16:26:16 -------------------------------------------------------------------------------- Patient/Caregiver Education Details Patient Name: Date of Service: [name] 7/31/2023andnbsp3:30 PM Medical Record Number: [id number] Patient Account Number: [id number] Date of Birth/Gender: Treating RN: 1945-09-17 (76 y.o. Sue Lush Primary Care Physician: [name] Other Clinician: Erenest Blank Referring Physician: Treating  Physician/Extender: Sabino Dick in Treatment: 3 Education Assessment Education Provided To: Patient Education Topics Provided Electronic Signature(s) Signed: 02/22/2022 4:28:59 PM By: Erenest Blank Entered By: Erenest Blank on 02/22/2022 16:26:44 -------------------------------------------------------------------------------- Wound Assessment Details Patient Name: Date of Service: [name] 02/22/2022 3:30 PM Medical Record Number: [id number] Patient Account Number: [id number] Date of Birth/Sex: Treating RN: 1946-04-27 (76 y.o. Sue Lush Primary Care Daviyon Widmayer: [name] Other Clinician: Referring Talibah Colasurdo: Treating Emmalyn Hinson/Extender: Truddie Hidden Weeks in Treatment: 3 Wound Status Wound Number: 1 Primary Etiology: Open Surgical Wound Wound Location: Right Breast Secondary Etiology: Calciphylaxis Wounding Event: Surgical Injury Wound Status: Open Date Acquired: 08/26/2021 [place]  Of Treatment: 3 Clustered Wound: No Wound Measurements Length: (cm) 0.7 Width: (cm) 0.9 Depth: (cm) 0.8 Area: (cm) 0.495 Volume: (cm) 0.396 % Reduction in Area: -597.2% % Reduction in Volume: -457.7% Wound Description Classification: Full Thickness With Exposed Support Structure Exudate Amount: Large Exudate Type: Serosanguineous Exudate Color: red, brown s Treatment Notes Wound #1 (Breast) Wound Laterality: Right Cleanser Soap and Water Discharge Instruction: May shower and wash wound with dial antibacterial soap and water prior to dressing change. Wound Cleanser Discharge Instruction: Cleanse the wound with wound cleanser prior to applying a clean dressing using gauze sponges, not tissue or cotton balls. Peri-Wound Care Skin Prep Discharge Instruction: Use skin prep as directed wound vac Topical Primary Dressing Secondary Dressing Secured With Compression Wrap Compression Stockings Add-Ons Electronic  Signature(s) Signed: 02/22/2022 4:16:51 PM By: Lorrin Jackson Signed: 02/22/2022 4:28:59 PM By: Erenest Blank Entered By: Erenest Blank on 02/22/2022 15:07:58 -------------------------------------------------------------------------------- [place] Details Patient Name: Date of Service: [name]. 02/22/2022 3:30 PM Medical Record Number: [id number] Patient Account Number: [id number] Date of Birth/Sex: Treating RN: 1946-02-27 (76 y.o. Sue Lush Primary Care Dominique Ressel: [name] Other Clinician: Referring Sony Schlarb: Treating Ranveer Wahlstrom/Extender: Truddie Hidden Weeks in Treatment: 3 Vital Signs Time Taken: 15:05 Temperature (F): 98.9 Height (in): 64 Pulse (bpm): 71 Weight (lbs): 180 Respiratory Rate (breaths/min): 18 Body Mass Index (BMI): 30.9 Blood Pressure (mmHg): 124/79 Reference Range: 80 - 120 mg / dl Electronic Signature(s) Signed: 02/22/2022 4:28:59 PM By: Erenest Blank Entered By: Erenest Blank on 02/22/2022 15:07:44

## 2022-02-23 NOTE — Progress Notes (Signed)
AKIVA, JOSEY ([id number]) Visit Report for 02/22/2022 SuperBill Details Patient Name: Date of Service: [name], [name] 02/22/2022 Medical Record Number: [id number] Patient Account Number: [id number] Date of Birth/Sex: Treating RN: 1946-06-23 (76 y.o. 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

## 2022-02-24 ENCOUNTER — Encounter (HOSPITAL_BASED_OUTPATIENT_CLINIC_OR_DEPARTMENT_OTHER): Payer: Medicare HMO | Attending: Physician Assistant | Admitting: Physician Assistant

## 2022-02-24 DIAGNOSIS — X58XXXA Exposure to other specified factors, initial encounter: Secondary | ICD-10-CM | POA: Insufficient documentation

## 2022-02-24 DIAGNOSIS — L98492 Non-pressure chronic ulcer of skin of other sites with fat layer exposed: Secondary | ICD-10-CM | POA: Insufficient documentation

## 2022-02-24 DIAGNOSIS — D6861 Antiphospholipid syndrome: Secondary | ICD-10-CM | POA: Diagnosis not present

## 2022-02-24 DIAGNOSIS — L598 Other specified disorders of the skin and subcutaneous tissue related to radiation: Secondary | ICD-10-CM | POA: Insufficient documentation

## 2022-02-24 DIAGNOSIS — T8131XA Disruption of external operation (surgical) wound, not elsewhere classified, initial encounter: Secondary | ICD-10-CM | POA: Insufficient documentation

## 2022-02-24 DIAGNOSIS — I2510 Atherosclerotic heart disease of native coronary artery without angina pectoris: Secondary | ICD-10-CM | POA: Diagnosis not present

## 2022-02-24 DIAGNOSIS — I10 Essential (primary) hypertension: Secondary | ICD-10-CM | POA: Diagnosis not present

## 2022-02-24 NOTE — Progress Notes (Addendum)
[name], [name] ([id number]) Visit Report for 02/24/2022 Chief Complaint Document Details Patient Name: Date of Service: [name] 02/24/2022 3:15 PM Medical Record Number: [id number] Patient Account Number: [id number] Date of Birth/Sex: Treating RN: 04-Mar-1946 (76 y.o. [name], [name] Primary Care Provider: [name] Other Clinician: Referring Provider: Treating Provider/Extender: [name] in Treatment: 4 Information Obtained from: Patient Chief Complaint Right breast ulcer Electronic Signature(s) Signed: 02/24/2022 3:33:52 PM By: Worthy Keeler PA-C Entered By: Worthy Keeler on 02/24/2022 15:33:52 -------------------------------------------------------------------------------- HPI Details Patient Name: Date of Service: [name], [name] 02/24/2022 3:15 PM Medical Record Number: [id number] Patient Account Number: [id number] Date of Birth/Sex: Treating RN: May 29, 1946 (76 y.o. [name], [name] Primary Care Provider: [name] Other Clinician: Referring Provider: Treating Provider/Extender: [name] in Treatment: 4 History of Present Illness HPI Description: 01-27-2022 upon evaluation today patient presents for initial evaluation here in our clinic concerning issues she has been having with her right breast. She had an area of calcium buildup where she had actual staples that were being pushed out that were noted from her previous breast surgery which was roughly 23 years ago when she had cancer. At that point she had also undergone radiation therapy. 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 Nonetheless after her most recent surgery in February to remove the staples and the calcium deposit this has started to build back up due to the damage to the breast tissue. She was seen initially by Dr. Brantley Stage. Eventually he referred her to Dr. Rebekah Chesterfield to see about a  mastectomy to take care of the issue. With that being said this was something that was good to be reserved just for worst-case scenario in fact they were a little bit concerned about her reaction considering her blood thinners and the fact that she may not do as well and surgery is what they would like to see with all the other medical problems that she has. For that reason they referred her to [name] in order to see what we can do from a healing standpoint. 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 02-03-2022 upon evaluation today patient appears to be doing okay in regard to her wound on the breast. Fortunately I do not see any signs of worsening. Unfortunately I do believe that she is still having some discomfort also there was some misunderstanding about how to do the dressing which I think will make it a little bit easier she should be putting it in before waiting it and then after its in and situated she can wet this. I think that is going to be a lot easier for her than what she has been trying to do. 02-10-2022 upon evaluation today patient appears to be doing about the same in regard to her wound. She has been tolerating the dressing changes without complication. We are actually starting her on linezolid today actually had to apply to get this approved by her insurance I am hopeful that this will be affordable for her now it was going to cost her way too much before. With that being said I do believe that she is headed  in the right direction which is great news. No fevers, chills, nausea, vomiting, or diarrhea. 7/26; right breast apparently secondary to surgery earlier this year for protruding  staples and underlying traumatic calcifications. She has a nonhealing wound with some depth and tunneling. She has been using Hydrofera Blue with some improvement in measurements today. Nevertheless she has a wound VAC and we went ahead and applied this. I am not certain whether she is going to be eligible for home health but her staff are certainly going to work on it 02-24-2022 upon evaluation today patient appears to be doing excellent she is using the gauze VAC at this point and it seems to be doing a great job for her. Fortunately there does not appear to be any signs of active infection at this time which is excellent. No fever or chills noted Electronic Signature(s) Signed: 02/24/2022 3:59:28 PM By: Worthy Keeler PA-C Entered By: Worthy Keeler on 02/24/2022 15:59:28 -------------------------------------------------------------------------------- Physical Exam Details Patient Name: Date of Service: [name], [name] 02/24/2022 3:15 PM Medical Record Number: [id number] Patient Account Number: [id number] Date of Birth/Sex: Treating RN: 1945-08-19 (76 y.o. [name] Primary Care Provider: [name] Other Clinician: Referring Provider: Treating Provider/Extender: Renae Fickle Weeks in Treatment: 4 Constitutional Well-nourished and well-hydrated in no acute distress. Respiratory normal breathing without difficulty. Psychiatric this patient is able to make decisions and demonstrates good insight into disease process. Alert and Oriented x 3. pleasant and cooperative. Notes Upon inspection patient's wound bed showed signs of excellent granulation and epithelization at this point. Fortunately I am extremely pleased with where we stand and I think we are on the right track. I do not see any evidence of infection locally or systemically which is great news. Electronic Signature(s) Signed: 02/24/2022 3:59:43 PM By: Worthy Keeler PA-C Entered By: Worthy Keeler on 02/24/2022 15:59:43 -------------------------------------------------------------------------------- Physician Orders Details Patient Name: Date of Service: [name], [name] 02/24/2022 3:15 PM Medical Record Number: [id number] Patient Account Number: [id number] Date of Birth/Sex: Treating RN: [birth year] (76 y.o. Harlow Ohms Primary Care Provider: [name] Other Clinician: Referring Provider: Treating Provider/Extender: [name] in Treatment: 4 Verbal / Phone Orders: No Diagnosis Coding ICD-10 Coding Code Description T81.31XA Disruption of external operation (surgical) wound, not elsewhere classified, initial encounter L98.492 Non-pressure chronic ulcer of skin of other sites with fat layer exposed E83.59 Other disorders of calcium metabolism L59.8 Other specified disorders of the skin and subcutaneous tissue related to radiation D68.61 Antiphospholipid syndrome I10 Essential (primary) hypertension I25.10 Atherosclerotic heart disease of native coronary artery without angina pectoris Follow-up Appointments ppointment in 2 weeks. Jeri Cos, [place] and [place], Wednesday - 8/16 at 1:00 PM Return A Negative Presssure Wound Therapy Wound Vac to wound continuously at 144m/hg pressure - twice a week dressing changes. Sterile Gauze Packing Home Health New wound care orders this week; continue Home Health for wound care. May utilize formulary equivalent dressing for wound treatment orders unless otherwise specified. - Home Health to change wound vac dressings twice a week- Mondays and Thursdays. Patient to come to wound center on Wednesdays- a wet to dry dressing will be applied and the following day (Thursday) home health will go back out to apply wound vac. Other Home Health Orders/Instructions: - [facility] Wound Treatment Wound #1 - Breast Wound Laterality: Right Prim Dressing: Plain packing strip 1/4 (in) ary Discharge  Instructions: Lightly pack as instructed Prim Dressing: Dakin's Solution 0.25%, 16 (  oz) ary Discharge Instructions: Moisten gauze with Dakin's solution Secondary Dressing: ALLEVYN Gentle Border, 3x3 (in/in) Discharge Instructions: Apply over primary dressing as directed. Secondary Dressing: Woven Gauze Sponge, Non-Sterile 4x4 in Discharge Instructions: Apply over primary dressing as directed. Electronic Signature(s) Signed: 02/24/2022 5:04:53 PM By: Worthy Keeler PA-C Signed: 02/24/2022 5:11:01 PM By: Sabas Sous By: Adline Peals on 02/24/2022 16:06:31 -------------------------------------------------------------------------------- Problem List Details Patient Name: Date of Service: [name] 02/24/2022 3:15 PM Medical Record Number: [id number] Patient Account Number: [id number] Date of Birth/Sex: Treating RN: Jul 11, 1946 (76 y.o. 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.59 Other disorders of calcium metabolism 01/27/2022 No Yes L59.8 Other specified disorders of the skin and subcutaneous tissue related to 01/27/2022 No Yes radiation D68.61 Antiphospholipid syndrome 01/27/2022 No Yes I10 Essential (primary) hypertension 01/27/2022 No Yes I25.10 Atherosclerotic heart disease of native coronary artery without angina pectoris 01/27/2022 No Yes Inactive Problems Resolved Problems Electronic Signature(s) Signed: 02/24/2022 3:33:41 PM By: Worthy Keeler PA-C Entered By: Worthy Keeler on 02/24/2022  15:33:41 -------------------------------------------------------------------------------- Progress Note Details Patient Name: Date of Service: [name] 02/24/2022 3:15 PM Medical Record Number: [id number] Patient Account Number: [id number] Date of Birth/Sex: Treating RN: 12-01-1945 (76 y.o. [name], [name] Primary Care Provider: [name] Other Clinician: Referring Provider: Treating Provider/Extender: [name] in Treatment: 4 Subjective Chief Complaint Information obtained from Patient Right breast ulcer History of Present Illness (HPI) 01-27-2022 upon evaluation today patient presents for initial evaluation here in our clinic concerning issues she has been having with her right breast. She had an area of calcium buildup where she had actual staples that were being pushed out that were noted from her previous breast surgery which was roughly 23 years ago when she had cancer. At that point she had also undergone radiation therapy. 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 Nonetheless after her most recent surgery in February to remove the staples and the calcium deposit this has started to build back up due to the damage to the breast tissue. She was seen initially by Dr. Brantley Stage. Eventually he referred her to Dr. Rebekah Chesterfield to see about a mastectomy to take care of the issue. With that being said this was something that was good to be reserved just for worst-case scenario in fact they were a little bit concerned about her reaction considering her blood thinners and the fact that she may not do as well and surgery is what they would like to see with all the other medical problems that she has. For that reason they referred her to [name] in order to see what we can do from a healing standpoint. 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 02-03-2022 upon evaluation today patient appears to be doing okay in regard to her wound on the breast. Fortunately I  do not see any signs of worsening. Unfortunately I do believe that she is still having some discomfort also there was some misunderstanding about how to do the dressing which I think will make it a little bit easier she should be putting it in before waiting it and then after its in and situated she can wet this. I think that is going to be a lot easier for her than what she has been trying to do. 02-10-2022 upon evaluation today patient appears to be doing about the same in regard to her wound. She has been tolerating the dressing changes without complication. We are actually starting her on linezolid today actually had to apply to get this approved by her insurance I am hopeful that this will be affordable for her now it was going to cost her way too much before. With that being said I do believe that she is headed in the right direction which is great news. No fevers, chills, nausea, vomiting, or diarrhea. 7/26; right breast apparently secondary to surgery earlier this year for protruding staples and underlying traumatic calcifications. She has a nonhealing wound with some depth and tunneling. She has been using Hydrofera Blue with some improvement in measurements today. Nevertheless she has a wound VAC and we went ahead and applied this. I am not certain whether she is going to be eligible for home health but her staff are certainly going to work on it 02-24-2022 upon evaluation today patient appears to be doing excellent she is using the gauze VAC at this point and it seems to be doing a great job for her. Fortunately there does not appear to be any  signs of active infection at this time which is excellent. No fever or chills noted Objective Constitutional Well-nourished and well-hydrated in no acute distress. Vitals Time Taken: 3:38 PM, Height: 64 in, Weight: 180 lbs, BMI: 30.9, Temperature: 98.6 F, Pulse: 89 bpm, Respiratory Rate: 18 breaths/min, Blood Pressure: 144/73 mmHg. Respiratory normal breathing without difficulty. Psychiatric this patient is able to make decisions and demonstrates good insight into disease process. Alert and Oriented x 3. pleasant and cooperative. General Notes: Upon inspection patient's wound bed showed signs of excellent granulation and epithelization at this point. Fortunately I am extremely pleased with where we stand and I think we are on the right track. I do not see any evidence of infection locally or systemically which is great news. Integumentary (Hair, Skin) Wound #1 status is Open. Original cause of wound was Surgical Injury. The date acquired was: 08/26/2021. The wound has been in treatment 4 weeks. The wound is located on the Right Breast. The wound measures 0.4cm length x 0.7cm width x 1cm depth; 0.22cm^2 area and 0.22cm^3 volume. There is Fat Layer (Subcutaneous Tissue) exposed. There is no undermining noted, however, there is tunneling at 9:00 with a maximum distance of 2.7cm. There is a large amount of serosanguineous drainage noted. The wound margin is distinct with the outline attached to the wound base. There is large (67-100%) red granulation within the wound bed. There is no necrotic tissue within the wound bed. 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 -  Jeri Cos, [place] and [place], Wednesday - 8/16 at 1:00 PM Negative Presssure Wound Therapy: Wound Vac to wound continuously at 175m/hg pressure - twice a week dressing changes. Sterile Gauze Packing Home Health: New wound care orders this week; continue Home Health for wound care. May utilize formulary equivalent dressing for wound treatment orders unless otherwise specified. - Home Health to change wound vac dressings twice a week- Mondays and Thursdays. Patient to come to wound center on Wednesdays- a wet to dry dressing will be applied and the following day (Thursday) home health will go back out to apply wound vac. Other Home Health Orders/Instructions: - [facility] 1. I am going to recommend that we going continue with the gauze back. For today we will use a little bit of plain packing strip with Dakin's moistened on top of this and pack until tomorrow when she gets the wound VAC put back on. 2. We are using a gauze wound VAC and I think that is doing a great job hopefully will be able to get this area healed that is the main and primary goal obviously with the use of the wound VAC already were seeing signs of improvement which is great news. We will see patient back for reevaluation in 2 weeks here in the clinic. If anything worsens or changes patient will contact our office for additional recommendations. Electronic Signature(s) Signed: 02/24/2022 4:00:13 PM By: SWorthy KeelerPA-C Entered By: SWorthy Keeleron 02/24/2022 16:00:13 -------------------------------------------------------------------------------- SuperBill Details Patient Name: Date of Service: HCHARISSE, WENDELL8/08/2021 Medical Record Number: [id number] Account Number: [id number] of Birth/Sex: Treating RN: 1June 27, 1947(76y.o. FTonita [name]  [name] Primary Care Provider: KBing MatterOther Clinician: Referring Provider: Treating Provider/Extender: SOdella Aquasin Treatment: 4 Diagnosis Coding ICD-10 Codes Code Description T81.31XA Disruption of external operation (surgical) wound, not elsewhere classified, initial encounter L98.492 Non-pressure chronic ulcer of skin of other sites with fat layer exposed E83.59 Other disorders of calcium metabolism L59.8 Other specified disorders of the skin and subcutaneous tissue related to radiation D68.61 Antiphospholipid syndrome I10 Essential (primary) hypertension I25.10 Atherosclerotic heart disease of native coronary artery without angina pectoris Facility Procedures CPT4 Code: [id number]: 99213 - WOUND CARE VISIT-LEV 3 EST PT Modifier: Quantity: 1 Physician Procedures : CPT4 Code Description Modifier [id number]- WC PHYS LEVEL 4 - EST PT ICD-10 Diagnosis Description T81.31XA Disruption of external operation (surgical) wound, not elsewhere classified, initial encounter L98.492 Non-pressure chronic ulcer of skin of  other sites with fat layer exposed E83.59 Other disorders of calcium metabolism L59.8 Other specified disorders of the skin and subcutaneous tissue related to radiation Quantity: 1 Electronic Signature(s) Signed: 02/24/2022 5:04:53 PM By: SWorthy KeelerPA-C Signed: 02/24/2022 5:11:01 PM By: HAdline PealsPrevious Signature: 02/24/2022 4:01:51 PM Version By: SWorthy KeelerPA-C Entered By: HAdline Pealson 02/24/2022 16:46:09

## 2022-02-24 NOTE — Progress Notes (Signed)
[name] ([id number]) Visit Report for 02/24/2022 Arrival Information Details Patient Name: Date of Service: [name], [name] 02/24/2022 3:15 PM Medical Record Number: [id number] Patient Account Number: [id number] Date of Birth/Sex: Treating RN: February 09, 1946 (76 y.o. Harlow Ohms Primary Care Epiphany Seltzer: [name] Other Clinician: Referring Srihari Shellhammer: Treating Ingvald Theisen/Extender: [name] in Treatment: 4 Visit Information History Since Last Visit Added or deleted any medications: No Patient Arrived: Ambulatory Any new allergies or adverse reactions: No Arrival Time: 15:34 Had a fall or experienced change in No Accompanied By: self activities of daily living that may affect Transfer Assistance: None risk of falls: Patient Identification Verified: Yes Signs or symptoms of abuse/neglect since last visito No Secondary Verification Process Completed: Yes Hospitalized since last visit: No Patient Requires Transmission-Based Precautions: No Implantable device outside of the clinic excluding No Patient Has Alerts: Yes cellular tissue based products placed in the center Patient Alerts: Patient on Blood Thinner since last visit: Has Dressing in Place as Prescribed: Yes Pain Present Now: Yes Electronic Signature(s) Signed: 02/24/2022 5:11:01 PM By: Adline Peals Entered By: Adline Peals on 02/24/2022 15:36:34 -------------------------------------------------------------------------------- Clinic Level of Care Assessment Details Patient Name: Date of Service: [name] 02/24/2022 3:15 PM Medical Record Number: [id number] Patient Account Number: [id number] Date of Birth/Sex: Treating RN: 07-Oct-1945 (76 y.o. Harlow Ohms Primary Care Janey Petron: [name] Other Clinician: Referring Shanira Tine: Treating Sailor Haughn/Extender: [name] in Treatment: 4 Clinic Level of Care Assessment  Items TOOL 4 Quantity Score X- 1 0 Use when only an EandM is performed on FOLLOW-UP visit ASSESSMENTS - Nursing Assessment / Reassessment X- 1 10 Reassessment of Co-morbidities (includes updates in patient status) X- 1 5 Reassessment of Adherence to Treatment Plan ASSESSMENTS - Wound and Skin A ssessment / Reassessment X - Simple Wound Assessment / Reassessment - one wound 1 5 '[]'$  - 0 Complex Wound Assessment / Reassessment - multiple wounds '[]'$  - 0 Dermatologic / Skin Assessment (not related to wound area) ASSESSMENTS - Focused Assessment '[]'$  - 0 Circumferential Edema Measurements - multi extremities '[]'$  - 0 Nutritional Assessment / Counseling / Intervention '[]'$  - 0 Lower Extremity Assessment (monofilament, tuning fork, pulses) '[]'$  - 0 Peripheral Arterial Disease Assessment (using hand held doppler) ASSESSMENTS - Ostomy and/or Continence Assessment and Care '[]'$  - 0 Incontinence Assessment and Management '[]'$  - 0 Ostomy Care Assessment and Management (repouching, etc.) PROCESS - Coordination of Care X - Simple Patient / Family Education for ongoing care 1 15 '[]'$  - 0 Complex (extensive) Patient / Family Education for ongoing care X- 1 10 Staff obtains Consents, Records, T Results / Process Orders est '[]'$  - 0 Staff telephones HHA, Nursing Homes / Clarify orders / etc '[]'$  - 0 Routine Transfer to another Facility (non-emergent condition) '[]'$  - 0 Routine Hospital Admission (non-emergent condition) '[]'$  - 0 New Admissions / Biomedical engineer / Ordering NPWT Apligraf, etc. , '[]'$  - 0 Emergency Hospital Admission (emergent condition) X- 1 10 Simple Discharge Coordination '[]'$  - 0 Complex (extensive) Discharge Coordination PROCESS - Special Needs '[]'$  - 0 Pediatric / Minor Patient Management '[]'$  - 0 Isolation Patient Management '[]'$  - 0 Hearing / Language / Visual special needs '[]'$  - 0 Assessment of Community assistance (transportation, D/C planning, etc.) '[]'$  - 0 Additional  assistance / Altered mentation '[]'$  - 0 Support Surface(s) Assessment (bed, cushion, seat, etc.) INTERVENTIONS - Wound Cleansing / Measurement X - Simple Wound Cleansing - one wound 1 5 '[]'$  - 0 Complex Wound  Cleansing - multiple wounds X- 1 5 Wound Imaging (photographs - any number of wounds) '[]'$  - 0 Wound Tracing (instead of photographs) X- 1 5 Simple Wound Measurement - one wound '[]'$  - 0 Complex Wound Measurement - multiple wounds INTERVENTIONS - Wound Dressings X - Small Wound Dressing one or multiple wounds 1 10 '[]'$  - 0 Medium Wound Dressing one or multiple wounds '[]'$  - 0 Large Wound Dressing one or multiple wounds '[]'$  - 0 Application of Medications - topical '[]'$  - 0 Application of Medications - injection INTERVENTIONS - Miscellaneous '[]'$  - 0 External ear exam '[]'$  - 0 Specimen Collection (cultures, biopsies, blood, body fluids, etc.) '[]'$  - 0 Specimen(s) / Culture(s) sent or taken to Lab for analysis '[]'$  - 0 Patient Transfer (multiple staff / Civil Service fast streamer / Similar devices) '[]'$  - 0 Simple Staple / Suture removal (25 or less) '[]'$  - 0 Complex Staple / Suture removal (26 or more) '[]'$  - 0 Hypo / Hyperglycemic Management (close monitor of Blood Glucose) '[]'$  - 0 Ankle / Brachial Index (ABI) - do not check if billed separately X- 1 5 Vital Signs Has the patient been seen at the hospital within the last three years: Yes Total Score: 85 Level Of Care: New/Established - Level 3 Electronic Signature(s) Signed: 02/24/2022 5:11:01 PM By: Adline Peals Entered By: Adline Peals on 02/24/2022 16:46:03 -------------------------------------------------------------------------------- Encounter Discharge Information Details Patient Name: Date of Service: [name] 02/24/2022 3:15 PM Medical Record Number: [id number] Patient Account Number: [id number] Date of Birth/Sex: Treating RN: 11-30-1945 (76 y.o. Harlow Ohms Primary Care Karlis Cregg: [name] Other  Clinician: Referring Claretta Kendra: Treating Nicholos Aloisi/Extender: [name] in Treatment: 4 Encounter Discharge Information Items Discharge Condition: Stable Ambulatory Status: Ambulatory Discharge Destination: Home Transportation: Private Auto Accompanied By: self Schedule Follow-up Appointment: Yes Clinical Summary of Care: Patient Declined Electronic Signature(s) Signed: 02/24/2022 5:11:01 PM By: Sabas Sous By: Adline Peals on 02/24/2022 16:46:32 -------------------------------------------------------------------------------- Lower Extremity Assessment Details Patient Name: Date of Service: GLORENE, LEITZKE 02/24/2022 3:15 PM Medical Record Number: [id number] Patient Account Number: [id number] Date of Birth/Sex: Treating RN: [birth year] (76 y.o. Harlow Ohms Primary Care Bassel Gaskill: [name] Other Clinician: Referring Callie Facey: Treating Artem Bunte/Extender: Renae Fickle Weeks in Treatment: 4 Electronic Signature(s) Signed: 02/24/2022 5:11:01 PM By: Sabas Sous By: Adline Peals on 02/24/2022 15:45:34 -------------------------------------------------------------------------------- [place] Details Patient Name: Date of Service: MIKE, BERNTSEN 02/24/2022 3:15 PM Medical Record Number: [id number] Patient Account Number: [id number] Date of Birth/Sex: Treating RN: [birth year] (76 y.o. Harlow Ohms Primary Care Krissia Schreier: [name] Other Clinician: Referring Alantra Popoca: Treating Yaden Seith/Extender: [name] in Treatment: 4 Active Inactive Wound/Skin Impairment Nursing Diagnoses: Impaired tissue integrity Knowledge deficit related to ulceration/compromised skin integrity Goals: Patient will have a decrease in wound volume by X% from date: (specify in notes) Date Initiated: 01/27/2022 Target Resolution Date: 02/26/2022 Goal  Status: Active Patient/caregiver will verbalize understanding of skin care regimen Date Initiated: 01/27/2022 Target Resolution Date: 02/26/2022 Goal Status: Active Ulcer/skin breakdown will have a volume reduction of 30% by week 4 Date Initiated: 01/27/2022 Target Resolution Date: 02/25/2022 Goal Status: Active Interventions: Assess patient/caregiver ability to obtain necessary supplies Assess patient/caregiver ability to perform ulcer/skin care regimen upon admission and as needed Assess ulceration(s) every visit Notes: Electronic Signature(s) Signed: 02/24/2022 5:11:01 PM By: Adline Peals Entered By: Adline Peals on 02/24/2022 15:48:19 -------------------------------------------------------------------------------- Pain Assessment Details Patient Name: Date of Service: [name] 02/24/2022 3:15 PM Medical  Record Number: [id number] Patient Account Number: [id number] Date of Birth/Sex: Treating RN: 02-05-1946 (76 y.o. Harlow Ohms Primary Care Lakeisa Heninger: [name] Other Clinician: Referring Verda Mehta: Treating Riyah Bardon/Extender: [name] in Treatment: 4 Active Problems Location of Pain Severity and Description of Pain Patient Has Paino Yes Site Locations Pain Location: Pain Location: Pain in Ulcers Duration of the Pain. Constant / Intermittento Intermittent Rate the pain. Current Pain Level: 6 Pain Management and Medication Current Pain Management: Electronic Signature(s) Signed: 02/24/2022 5:11:01 PM By: Adline Peals Entered By: Adline Peals on 02/24/2022 15:45:21 -------------------------------------------------------------------------------- Patient/Caregiver Education Details Patient Name: Date of Service: [name] 8/2/2023andnbsp3:15 PM Medical Record Number: [id number] Patient Account Number: [id number] Date of Birth/Gender: Treating RN: 06/19/1946 (76 y.o. Harlow Ohms Primary Care Physician: [name] Other Clinician: Referring Physician: Treating Physician/Extender: [name] in Treatment: 4 Education Assessment Education Provided To: Patient Education Topics Provided Wound/Skin Impairment: Methods: Explain/Verbal Responses: Reinforcements needed, State content correctly Electronic Signature(s) Signed: 02/24/2022 5:11:01 PM By: Adline Peals Entered By: Adline Peals on 02/24/2022 15:48:34 -------------------------------------------------------------------------------- Wound Assessment Details Patient Name: Date of Service: BRINDLE, LEYBA 02/24/2022 3:15 PM Medical Record Number: [id number] Patient Account Number: [id number] Date of Birth/Sex: Treating RN: [birth year] (76 y.o. 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 Event: Surgical Injury Etiology: Date Acquired: 08/26/2021 Wound Open Weeks Of Treatment: 4 Status: Clustered Wound: No Comorbid Asthma, Deep Vein Thrombosis, Hypertension, Peripheral Arterial History: Disease, Hepatitis B, Osteoarthritis, Neuropathy, Received Radiation Photos Wound Measurements Length: (cm) 0.4 Width: (cm) 0.7 Depth: (cm) 1 Area: (cm) 0.22 Volume: (cm) 0.22 % Reduction in Area: -209.9% % Reduction in Volume: -209.9% Epithelialization: Small (1-33%) Tunneling: Yes Position (o'clock): 9 Maximum Distance: (cm) 2.7 Undermining: No Wound Description Classification: Full Thickness With Exposed Support Structures Wound Margin: Distinct, outline attached Exudate Amount: Large Exudate Type: Serosanguineous Exudate Color: red, brown Foul Odor After Cleansing: No Slough/Fibrino  No Wound Bed Granulation Amount: Large (67-100%) Exposed Structure Granulation Quality: Red Fascia Exposed: No Necrotic Amount: None Present (0%) Fat Layer (Subcutaneous Tissue) Exposed: Yes Tendon Exposed: No Muscle Exposed: No Joint Exposed: No Bone Exposed: No Treatment Notes Wound #1 (Breast) Wound Laterality: Right Cleanser Peri-Wound Care Topical Primary Dressing Plain packing strip 1/4 (in) Discharge Instruction: Lightly pack as instructed Dakin's Solution 0.25%, 16 (oz) Discharge Instruction: Moisten gauze with Dakin's solution Secondary Dressing ALLEVYN Gentle Border, 3x3 (in/in) Discharge Instruction: Apply over primary dressing as directed. Woven Gauze Sponge, Non-Sterile 4x4 in Discharge Instruction: Apply over primary dressing as directed. Secured With Compression Wrap Compression Stockings Environmental education officer) Signed: 02/24/2022 5:05:50 PM By: Lorrin Jackson Signed: 02/24/2022 5:11:01 PM By: Sabas Sous By: Lorrin Jackson on 02/24/2022 15:50:33 -------------------------------------------------------------------------------- [place] Details Patient Name: Date of Service: [name] 02/24/2022 3:15 PM Medical Record Number: [id number] Patient Account Number: [id number] Date of Birth/Sex: Treating RN: Apr 08, 1946 (76 y.o. Harlow Ohms Primary Care Luisa Louk: [name] Other Clinician: Referring Celeste Candelas: Treating Shamirah Ivan/Extender: [name] in Treatment: 4 Vital Signs Time Taken: 15:38 Temperature (F): 98.6 Height (in): 64 Pulse (bpm): 89 Weight (lbs): 180 Respiratory Rate (breaths/min): 18 Body Mass Index (BMI): 30.9 Blood Pressure (mmHg): 144/73 Reference Range: 80 - 120 mg / dl Electronic Signature(s) Signed: 02/24/2022 5:11:01 PM By: Sabas Sous  By: Adline Peals on 02/24/2022 15:38:34

## 2022-02-26 ENCOUNTER — Inpatient Hospital Stay: Payer: Medicare HMO

## 2022-02-26 ENCOUNTER — Other Ambulatory Visit: Payer: Medicare HMO

## 2022-03-01 NOTE — Progress Notes (Signed)
[name] ([id number]) Visit Report for 02/17/2022 Arrival Information Details Patient Name: Date of Service: [name], [name] 02/17/2022 10:00 A M Medical Record Number: [id number] Patient Account Number: [id number] Date of Birth/Sex: Treating RN: 03/13/1946 (76 y.o. [name] Primary Care Iveliz Garay: [name] Other Clinician: Referring Maille Halliwell: Treating Hildegarde Dunaway/Extender: Barkley Bruns in Treatment: 3 Visit Information History Since Last Visit Added or deleted any medications: Yes Patient Arrived: Ambulatory Any new allergies or adverse reactions: Yes Arrival Time: 10:07 Had a fall or experienced change in No Accompanied By: self activities of daily living that may affect Transfer Assistance: None risk of falls: Patient Identification Verified: Yes Signs or symptoms of abuse/neglect since last visito No Secondary Verification Process Completed: Yes Hospitalized since last visit: Yes Patient Requires Transmission-Based Precautions: No Implantable device outside of the clinic excluding No Patient Has Alerts: Yes cellular tissue based products placed in the center Patient Alerts: Patient on Blood Thinner since last visit: Has Dressing in Place as Prescribed: Yes Pain Present Now: Yes Electronic Signature(s) Signed: 02/17/2022 4:00:27 PM By: Erenest Blank Entered By: Erenest Blank on 02/17/2022 10:13:11 -------------------------------------------------------------------------------- Lower Extremity Assessment Details Patient Name: Date of Service: [name], [name] 02/17/2022 10:00 A M Medical Record Number: [id number] Patient Account Number: [id number] Date of Birth/Sex: Treating RN: August 06, 1945 (76 y.o. [name] Primary Care Shariya Gaster: [name] Other Clinician: Referring Moksh Loomer: Treating Dearis Danis/Extender: Barkley Bruns in Treatment: 3 Electronic Signature(s) Signed: 02/17/2022  4:47:33 PM By: Sharyn Creamer RN, BSN Entered By: Sharyn Creamer on 02/17/2022 10:20:20 -------------------------------------------------------------------------------- Multi Wound Chart Details Patient Name: Date of Service: [name] 02/17/2022 10:00 A M Medical Record Number: [id number] Patient Account Number: [id number] Date of Birth/Sex: Treating RN: January 31, 1946 (76 y.o. [name] Primary Care Mohanad Carsten: [name] Other Clinician: Referring Jamea Robicheaux: Treating Asa Fath/Extender: Barkley Bruns in Treatment: 3 Vital Signs Height(in): 34 Pulse(bpm): 75 Weight(lbs): 180 Blood Pressure(mmHg): 131/75 Body Mass Index(BMI): 30.9 Temperature(F): 98.2 Respiratory Rate(breaths/min): 18 Photos: [N/A:N/A] Right Breast N/A N/A Wound Location: Surgical Injury N/A N/A Wounding Event: Open Surgical Wound N/A N/A Primary Etiology: Calciphylaxis N/A N/A Secondary Etiology: Asthma, Deep Vein Thrombosis, N/A N/A Comorbid History: Hypertension, Peripheral Arterial Disease, Hepatitis B, Osteoarthritis, Neuropathy, Received Radiation 08/26/2021 N/A N/A Date Acquired: 3 N/A N/A Weeks of Treatment: Open N/A N/A Wound Status: No N/A N/A Wound Recurrence: 0.7x0.9x0.8 N/A N/A Measurements L x W x D (cm) 0.495 N/A N/A A (cm) : rea 0.396 N/A N/A Volume (cm) : -597.20% N/A N/A % Reduction in A rea: -457.70% N/A N/A % Reduction in Volume: 9 Position 1 (o'clock): 2.8 Maximum Distance 1 (cm): Yes N/A N/A Tunneling: Full Thickness With Exposed Support N/A N/A Classification: Structures Large N/A N/A Exudate Amount: Serosanguineous N/A N/A Exudate Type: red, brown N/A N/A Exudate Color: Yes N/A N/A Foul Odor A Cleansing: fter No N/A N/A Odor Anticipated Due to Product Use: Distinct, outline attached N/A N/A Wound Margin: Large (67-100%) N/A N/A Granulation A mount: Red, Pink N/A N/A Granulation Quality: Small (1-33%) N/A  N/A Necrotic Amount: Fat Layer (Subcutaneous Tissue): Yes N/A N/A Exposed Structures: Fascia: No Tendon: No Muscle: No Joint: No Bone: No None N/A N/A Epithelialization: Negative Pressure Wound Therapy N/A N/A Procedures Performed: Application (NPWT) Treatment Notes Electronic Signature(s) Signed: 02/17/2022 4:38:07 PM By: Linton Ham MD Signed: 03/01/2022 4:45:01 PM By: Rhae Hammock RN Entered By: Linton Ham on 02/17/2022 11:15:50 -------------------------------------------------------------------------------- Multi-Disciplinary Care Plan Details Patient Name: Date of Service: [name]  P. 02/17/2022 10:00 A M Medical Record Number: [id number] Patient Account Number: [id number] Date of Birth/Sex: Treating RN: 12-09-1945 (76 y.o. [name] Primary Care Jeannette Maddy: [name] Other Clinician: Referring Sela Falk: Treating Savanna Dooley/Extender: Barkley Bruns in Treatment: 3 Active Inactive Wound/Skin Impairment Nursing Diagnoses: Impaired tissue integrity Knowledge deficit related to ulceration/compromised skin integrity Goals: Patient will have a decrease in wound volume by X% from date: (specify in notes) Date Initiated: 01/27/2022 Target Resolution Date: 02/26/2022 Goal Status: Active Patient/caregiver will verbalize understanding of skin care regimen Date Initiated: 01/27/2022 Target Resolution Date: 02/26/2022 Goal Status: Active Ulcer/skin breakdown will have a volume reduction of 30% by week 4 Date Initiated: 01/27/2022 Target Resolution Date: 02/25/2022 Goal Status: Active Interventions: Assess patient/caregiver ability to obtain necessary supplies Assess patient/caregiver ability to perform ulcer/skin care regimen upon admission and as needed Assess ulceration(s) every visit Notes: Electronic Signature(s) Signed: 02/17/2022 4:47:33 PM By: Sharyn Creamer RN, BSN Entered By: Sharyn Creamer on 02/17/2022  10:21:07 -------------------------------------------------------------------------------- Negative Pressure Wound Therapy Application (NPWT) Details Patient Name: Date of Service: [name] 02/17/2022 10:00 A M Medical Record Number: [id number] Patient Account Number: [id number] Date of Birth/Sex: Treating RN: 1946/03/25 (76 y.o. [name] Primary Care Marguerite Barba: [name] Other Clinician: Referring Brynna Dobos: Treating Kaetlin Bullen/Extender: Barkley Bruns in Treatment: 3 NPWT Application Performed for: Wound #1 Right Breast Performed By: Sharyn Creamer, RN Type: VAC System Coverage Size (sq cm): 0.63 Pressure Type: Constant Pressure Setting: 125 mmHG Drain Type: None Primary Contact: Other Quantity of Sponges/Gauze Inserted: gauze packing Sponge/Dressing Type: Gauze Date Initiated: 02/17/2022 Response to Treatment: pt tolerated well Post Procedure Diagnosis Same as Pre-procedure Electronic Signature(s) Signed: 02/17/2022 4:47:33 PM By: Sharyn Creamer RN, BSN Entered By: Sharyn Creamer on 02/17/2022 11:01:38 -------------------------------------------------------------------------------- Pain Assessment Details Patient Name: Date of Service: [name] 02/17/2022 10:00 A M Medical Record Number: [id number] Patient Account Number: [id number] Date of Birth/Sex: Treating RN: [birth year] (76 y.o. [name] Primary Care Antwaine Boomhower: [name] Other Clinician: Referring Terre Hanneman: Treating Duilio Heritage/Extender: Barkley Bruns in Treatment: 3 Active Problems Location of Pain Severity and Description of Pain Patient Has Paino Yes Site Locations Rate the pain. Current Pain Level: 7 Pain Management and Medication Current Pain Management: Electronic Signature(s) Signed: 02/17/2022 4:00:27 PM By: Erenest Blank Signed: 03/01/2022 4:45:01 PM By: Rhae Hammock RN Entered By: Erenest Blank on  02/17/2022 10:13:02 -------------------------------------------------------------------------------- Patient/Caregiver Education Details Patient Name: Date of Service: Tompkins, Mckell P. 7/26/2023andnbsp10:00 A M Medical Record Number: [id number] Patient Account Number: [id number] Date of Birth/Gender: Treating RN: [birth year] (76 y.o. [name] Primary Care Physician: [name] Other Clinician: Referring Physician: Treating Physician/Extender: Barkley Bruns in Treatment: 3 Education Assessment Education Provided To: Patient Education Topics Provided Wound/Skin Impairment: Methods: Explain/Verbal Responses: State content correctly Electronic Signature(s) Signed: 02/17/2022 4:47:33 PM By: Sharyn Creamer RN, BSN Entered By: Sharyn Creamer on 02/17/2022 10:23:15 -------------------------------------------------------------------------------- Wound Assessment Details Patient Name: Date of Service: [name] 02/17/2022 10:00 A M Medical Record Number: [id number] Patient Account Number: [id number] Date of Birth/Sex: Treating RN: 1946/06/26 (76 y.o. [name] Primary Care Charrisse Masley: [name] Other Clinician: Referring Tauriel Scronce: Treating Naveen Lorusso/Extender: Barkley Bruns in Treatment: 3 Wound Status Wound Number: 1 Primary Open Surgical Wound Etiology: Wound Location: Right Breast Secondary Calciphylaxis Wounding Event: Surgical Injury Etiology: Date Acquired: 08/26/2021 Wound Open Weeks Of Treatment: 3 Status: Clustered Wound: No Comorbid Asthma, Deep Vein Thrombosis, Hypertension, Peripheral Arterial History: Disease, Hepatitis  B, Osteoarthritis, Neuropathy, Received Radiation Photos Wound Measurements Length: (cm) 0.7 Width: (cm) 0.9 Depth: (cm) 0.8 Area: (cm) 0.495 Volume: (cm) 0.396 % Reduction in Area: -597.2% % Reduction in Volume: -457.7% Epithelialization: None Tunneling:  Yes Position (o'clock): 9 Maximum Distance: (cm) 2.8 Undermining: No Wound Description Classification: Full Thickness With Exposed Support Structures Wound Margin: Distinct, outline attached Exudate Amount: Large Exudate Type: Serosanguineous Exudate Color: red, brown Foul Odor After Cleansing: Yes Due to Product Use: No Slough/Fibrino Yes Wound Bed Granulation Amount: Large (67-100%) Exposed Structure Granulation Quality: Red, Pink Fascia Exposed: No Necrotic Amount: Small (1-33%) Fat Layer (Subcutaneous Tissue) Exposed: Yes Necrotic Quality: Adherent Slough Tendon Exposed: No Muscle Exposed: No Joint Exposed: No Bone Exposed: No Electronic Signature(s) Signed: 02/17/2022 4:47:33 PM By: Sharyn Creamer RN, BSN Entered By: Sharyn Creamer on 02/17/2022 10:19:45 -------------------------------------------------------------------------------- [place] Details Patient Name: Date of Service: [name]. 02/17/2022 10:00 A M Medical Record Number: [id number] Patient Account Number: [id number] Date of Birth/Sex: Treating RN: April 22, 1946 (76 y.o. [name] Primary Care Thao Bauza: [name] Other Clinician: Referring Dajaun Goldring: Treating Layal Javid/Extender: Barkley Bruns in Treatment: 3 Vital Signs Time Taken: 10:10 Temperature (F): 98.2 Height (in): 64 Pulse (bpm): 69 Weight (lbs): 180 Respiratory Rate (breaths/min): 18 Body Mass Index (BMI): 30.9 Blood Pressure (mmHg): 131/75 Reference Range: 80 - 120 mg / dl Electronic Signature(s) Signed: 02/17/2022 4:00:27 PM By: Erenest Blank Entered By: Erenest Blank on 02/17/2022 10:11:36

## 2022-03-10 ENCOUNTER — Encounter (HOSPITAL_BASED_OUTPATIENT_CLINIC_OR_DEPARTMENT_OTHER): Payer: Medicare HMO | Admitting: Physician Assistant

## 2022-03-10 DIAGNOSIS — T8131XA Disruption of external operation (surgical) wound, not elsewhere classified, initial encounter: Secondary | ICD-10-CM | POA: Diagnosis not present

## 2022-03-10 NOTE — Progress Notes (Addendum)
MODELL, FENDRICK ([id number]) Visit Report for 03/10/2022 Chief Complaint Document Details Patient Name: Date of Service: [name], [name] 03/10/2022 1:00 PM Medical Record Number: [id number] Patient Account Number: [id number] Date of Birth/Sex: Treating RN: 15-Mar-1946 (76 y.o. [name], [name] Primary Care Provider: [name] Other Clinician: Referring Provider: Treating Provider/Extender: [name] in Treatment: 6 Information Obtained from: Patient Chief Complaint Right breast ulcer Electronic Signature(s) Signed: 03/10/2022 1:45:39 PM By: Worthy Keeler PA-C Entered By: Worthy Keeler on 03/10/2022 13:45:39 -------------------------------------------------------------------------------- HPI Details Patient Name: Date of Service: [name] 03/10/2022 1:00 PM Medical Record Number: [id number] Patient Account Number: [id number] Date of Birth/Sex: Treating RN: 11/10/1945 (76 y.o. [name], [name] Primary Care Provider: [name] Other Clinician: Referring Provider: Treating Provider/Extender: [name] in Treatment: 6 History of Present Illness HPI Description: 01-27-2022 upon evaluation today patient presents for initial evaluation here in our clinic concerning issues she has been having with her right breast. She had an area of calcium buildup where she had actual staples that were being pushed out that were noted from her previous breast surgery which was roughly 23 years ago when she had cancer. At that point she had also undergone radiation therapy. 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 Nonetheless after her most recent surgery in February to remove the staples and the calcium deposit this has started to build back up due to the damage to the breast tissue. She was seen initially by Dr. Brantley [name]. Eventually he referred her to Dr. Rebekah [name] to see about a  mastectomy to take care of the issue. With that being said this was something that was good to be reserved just for worst-case scenario in fact they were a little bit concerned about her reaction considering her blood thinners and the fact that she may not do as well and surgery is what they would like to see with all the other medical problems that she has. For that reason they referred her to [name] in order to see what we can do from a healing standpoint. 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 02-03-2022 upon evaluation today patient appears to be doing okay in regard to her wound on the breast. Fortunately I do not see any signs of worsening. Unfortunately I do believe that she is still having some discomfort also there was some misunderstanding about how to do the dressing which I think will make it a little bit easier she should be putting it in before waiting it and then after its in and situated she can wet this. I think that is going to be a lot easier for her than what she has been trying to do. 02-10-2022 upon evaluation today patient appears to be doing about the same in regard to her wound. She has been tolerating the dressing changes without complication. We are actually starting her on linezolid today actually had to apply to get this approved by her insurance I am hopeful that this will be affordable for her now it was going to cost her way too much before. With that being said I do believe that she is headed  in the right direction which is great news. No fevers, chills, nausea, vomiting, or diarrhea. 7/26; right breast apparently secondary to surgery earlier this year for protruding  staples and underlying traumatic calcifications. She has a nonhealing wound with some depth and tunneling. She has been using Hydrofera Blue with some improvement in measurements today. Nevertheless she has a wound VAC and we went ahead and applied this. I am not certain whether she is going to be eligible for home health but her staff are certainly going to work on it 02-24-2022 upon evaluation today patient appears to be doing excellent she is using the gauze VAC at this point and it seems to be doing a great job for her. Fortunately there does not appear to be any signs of active infection at this time which is excellent. No fever or chills noted 03-10-2022 upon evaluation today patient appears to be doing well with regard to her wound although the wound was definitely over packed compared to what it needs to be. I think they were probably got to switch to [facility] Blue rope which I think would be much easier to put him than the current black foam which is what home health is actually using we will try to do a gauze VAC but to be honest there is not actually doing that. Fortunately there does not appear to be any signs of active infection locally or systemically at this time which is great news. I think the biggest issue I see is simply that we need to make sure and have the patient have the Hydrofera Blue rope which is again I think a much better way to go with this and avoid altogether the use of the black foam inside the wound and put a piece on the outside that is appropriate and fine. Electronic Signature(s) Signed: 03/10/2022 2:27:54 PM By: Worthy Keeler PA-C Entered By: Worthy Keeler on 03/10/2022 14:27:54 -------------------------------------------------------------------------------- Physical Exam Details Patient Name: Date of Service: [name], [name] 03/10/2022 1:00 PM Medical Record Number: [id number] Patient Account Number: [id number] Date of Birth/Sex: Treating RN: 10-04-1945  (76 y.o. [name] Primary Care Provider: [name] Other Clinician: Referring Provider: Treating Provider/Extender: [name] in Treatment: 6 Constitutional Well-nourished and well-hydrated in no acute distress. Respiratory normal breathing without difficulty. Psychiatric this patient is able to make decisions and demonstrates good insight into disease process. Alert and Oriented x 3. pleasant and cooperative. Notes Upon inspection patient's wound bed actually showed signs of good granulation and epithelization at this point. Fortunately I do not see any signs of active infection locally which is great news and overall even systemically I feel like were doing quite well here. I do not think that there is anything major that needs to change other than the fact that a switch to [facility] Blue rope as far as what goes into the wound bed with the wound VAC in order to allow this to heal much more effectively and quickly. Electronic Signature(s) Signed: 03/10/2022 2:28:37 PM By: Worthy Keeler PA-C Entered By: Worthy Keeler on 03/10/2022 14:28:37 -------------------------------------------------------------------------------- Physician Orders Details Patient Name: Date of Service: VELEDA, MUN 03/10/2022 1:00 PM Medical Record Number: [id number] Patient Account Number: [id number] Date of Birth/Sex: Treating RN: 07/18/1946 (76 y.o. [name], [name] Primary Care Provider: [name] Other Clinician: Referring Provider: Treating Provider/Extender: [name] in Treatment: 6 Verbal / Phone Orders: No Diagnosis Coding ICD-10 Coding Code Description T81.31XA Disruption of  external operation (surgical) wound, not elsewhere classified, initial encounter L98.492 Non-pressure chronic ulcer of skin of other sites with fat layer exposed E83.59 Other disorders of calcium metabolism L59.8 Other specified  disorders of the skin and subcutaneous tissue related to radiation D68.61 Antiphospholipid syndrome I10 Essential (primary) hypertension I25.10 Atherosclerotic heart disease of native coronary artery without angina pectoris Follow-up Appointments ppointment in 1 week. - 03/17/22 @ 1345 w/ Jeri Cos and Allayne Butcher Rm # 9 Return A Negative Presssure Wound Therapy Wound Vac to wound continuously at 15m/hg pressure - twice a week dressing changes. Home Health New wound care orders this week; continue Home Health for wound care. May utilize formulary equivalent dressing for wound treatment orders unless otherwise specified. - **********Home Health to change wound vac dressings twice a week- Mondays and Thursdays. Patient to come to wound center on Wednesdays- a wet to dry dressing will be applied and the following day (Thursday) home health will go back out to apply wound vac. ***Change gauze packing to hydraferablue rope packing*** Other Home Health Orders/Instructions: - [facility] home [facility] Wound Treatment Wound #1 - Breast Wound Laterality: Right Cleanser: Wound Cleanser (Home Health) (Generic) 3 x Per Week/15 Days Discharge Instructions: Cleanse the wound with wound cleanser prior to applying a clean dressing using gauze sponges, not tissue or cotton balls. Prim Dressing: Hydrofera Blue Classic Foam Rope Dressing, 9x6 (mm/in) (Home Health) 3 x Per Week/15 Days ary Discharge Instructions: Pack gently into wound bed, leaving tail(wider part) out of wound. Moisten with saline after packing in. Then apply wound vac over top of that. Secondary Dressing: ALLEVYN Gentle Border, 3x3 (in/in) 3 x Per Week/15 Days Discharge Instructions: In clinic Apply over primary dressing as directed. Secondary Dressing: Woven Gauze Sponge, Non-Sterile 4x4 in 3 x Per Week/15 Days Discharge Instructions: In clinic Apply over primary dressing as directed. Electronic Signature(s) Signed: 03/10/2022 4:35:53 PM By:  SWorthy KeelerPA-C Signed: 03/16/2022 3:41:21 PM By: BRhae HammockRN Entered By: BRhae Hammockon 03/10/2022 14:14:02 -------------------------------------------------------------------------------- Problem List Details Patient Name: Date of Service: HChrystine [name] 03/10/2022 1:00 PM Medical Record Number: [id number] Account Number: [id number] of Birth/Sex: Treating RN: 112/16/1947(76y.o. FTonita [name] [name] Primary Care Provider: KBing MatterOther Clinician: Referring Provider: Treating Provider/Extender: SOdella Aquasin Treatment: 6 Active Problems ICD-10 Encounter Code Description Active Date MDM Diagnosis T81.31XA Disruption of external operation (surgical) wound, not elsewhere classified, 01/27/2022 No Yes initial encounter L98.492 Non-pressure chronic ulcer of skin of other sites with fat layer exposed 01/27/2022 No Yes E83.59 Other disorders of calcium metabolism 01/27/2022 No Yes L59.8 Other specified disorders of the skin and subcutaneous tissue related to 01/27/2022 No Yes radiation D68.61 Antiphospholipid syndrome 01/27/2022 No Yes I10 Essential (primary) hypertension 01/27/2022 No Yes I25.10 Atherosclerotic heart disease of native coronary artery without angina pectoris 01/27/2022 No Yes Inactive Problems Resolved Problems Electronic Signature(s) Signed: 03/10/2022 1:45:30 PM By: SWorthy KeelerPA-C Entered By: SWorthy Keeleron 03/10/2022 13:45:30 -------------------------------------------------------------------------------- Progress Note Details Patient Name: Date of Service: HChrystine [name] 03/10/2022 1:00 PM Medical Record Number: [id number] Account Number: [id number] of Birth/Sex: Treating RN: 1Sep 30, 1947(76y.o. FTonita [name] [name] Primary Care Provider: KBing MatterOther Clinician: Referring Provider: Treating Provider/Extender: SOdella Aquasin Treatment:  6 Subjective Chief Complaint Information obtained from Patient Right breast ulcer History of Present Illness (HPI) 01-27-2022 upon evaluation today patient presents for initial evaluation here in our clinic concerning issues she has  been having with her right breast. She had an area of calcium buildup where she had actual staples that were being pushed out that were noted from her previous breast surgery which was roughly 23 years ago when she had cancer. At that point she had also undergone radiation therapy. 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 Nonetheless after her most recent surgery in February to remove the staples and the calcium deposit this has started to build back up due to the damage to the breast tissue. She was seen initially by Dr. Brantley [name]. Eventually he referred her to Dr. Rebekah [name] to see about a mastectomy to take care of the issue. With that being said this was something that was good to be reserved just for worst-case scenario in fact they were a little bit concerned about her reaction considering her blood thinners and the fact that she may not do as well and surgery is what they would like to see with all the other medical problems that she has. For that reason they referred her to [name] in order to see what we can do from a healing standpoint. 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 02-03-2022 upon evaluation today patient appears to be doing okay in regard to her wound on the breast. Fortunately I do not see any signs of worsening. Unfortunately I do believe that she is still having some discomfort also there was some  misunderstanding about how to do the dressing which I think will make it a little bit easier she should be putting it in before waiting it and then after its in and situated she can wet this. I think that is going to be a lot easier for her than what she has been trying to do. 02-10-2022 upon evaluation today patient appears to be doing about the same in regard to her wound. She has been tolerating the dressing changes without complication. We are actually starting her on linezolid today actually had to apply to get this approved by her insurance I am hopeful that this will be affordable for her now it was going to cost her way too much before. With that being said I do believe that she is headed in the right direction which is great news. No fevers, chills, nausea, vomiting, or diarrhea. 7/26; right breast apparently secondary to surgery earlier this year for protruding staples and underlying traumatic calcifications. She has a nonhealing wound with some depth and tunneling. She has been using Hydrofera Blue with some improvement in measurements today. Nevertheless she has a wound VAC and we went ahead and applied this. I am not certain whether she is going to be eligible for home health but her staff are certainly going to work on it 02-24-2022 upon evaluation today patient appears to be doing excellent she is using the gauze VAC at this point and it seems to be doing a great job for her. Fortunately there does not appear to be any signs of active infection at this time which is excellent. No fever or chills noted 03-10-2022 upon evaluation today patient appears to be doing well with regard to her wound although the wound was definitely over packed  compared to what it needs to be. I think they were probably got to switch to [facility] Blue rope which I think would be much easier to put him than the current black foam which is what home health is actually using we will try to do a gauze VAC but to be  honest there is not actually doing that. Fortunately there does not appear to be any signs of active infection locally or systemically at this time which is great news. I think the biggest issue I see is simply that we need to make sure and have the patient have the Hydrofera Blue rope which is again I think a much better way to go with this and avoid altogether the use of the black foam inside the wound and put a piece on the outside that is appropriate and fine. Objective Constitutional Well-nourished and well-hydrated in no acute distress. Vitals Time Taken: 1:26 PM, Height: 64 in, Weight: 180 lbs, BMI: 30.9, Temperature: 98.7 F, Pulse: 72 bpm, Respiratory Rate: 18 breaths/min, Blood Pressure: 125/84 mmHg. Respiratory normal breathing without difficulty. Psychiatric this patient is able to make decisions and demonstrates good insight into disease process. Alert and Oriented x 3. pleasant and cooperative. General Notes: Upon inspection patient's wound bed actually showed signs of good granulation and epithelization at this point. Fortunately I do not see any signs of active infection locally which is great news and overall even systemically I feel like were doing quite well here. I do not think that there is anything major that needs to change other than the fact that a switch to [facility] Blue rope as far as what goes into the wound bed with the wound VAC in order to allow this to heal much more effectively and quickly. Integumentary (Hair, Skin) Wound #1 status is Open. Original cause of wound was Surgical Injury. The date acquired was: 08/26/2021. The wound has been in treatment 6 [name]. The wound is located on the Right Breast. The wound measures 0.4cm length x 0.7cm width x 0.7cm depth; 0.22cm^2 area and 0.154cm^3 volume. There is Fat Layer (Subcutaneous Tissue) exposed. There is no undermining noted, however, there is tunneling at 10:00 with a maximum distance of 1.8cm. There is a  large amount of serosanguineous drainage noted. The wound margin is distinct with the outline attached to the wound base. There is large (67-100%) red granulation within the wound bed. There is no necrotic tissue within the wound bed. 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 - 03/17/22 @ 1345 w/ Jeri Cos and [place] Rm # 9 Negative Presssure Wound Therapy: Wound Vac to wound continuously at 16m/hg pressure - twice a week dressing changes. Home Health: New wound care orders this week; continue Home Health for wound care. May utilize formulary equivalent dressing for wound treatment orders unless otherwise specified. - **********Home Health to change wound vac dressings twice a week- Mondays and Thursdays. Patient to come to wound center on Wednesdays- a wet to dry dressing will be applied and the following day (Thursday) home health will go back out to apply wound vac. ***Change gauze packing to hydraferablue rope packing*** Other Home Health Orders/Instructions: -  [facility] home [facility] WOUND #1: - Breast Wound Laterality: Right Cleanser: Wound Cleanser (Home Health) (Generic) 3 x Per Week/15 Days Discharge Instructions: Cleanse the wound with wound cleanser prior to applying a clean dressing using gauze sponges, not tissue or cotton balls. Prim Dressing: Hydrofera Blue Classic Foam Rope Dressing, 9x6 (mm/in) (Home Health) 3 x Per Week/15 Days ary Discharge Instructions: Pack gently into wound bed, leaving tail(wider  part) out of wound. Moisten with saline after packing in. Then apply wound vac over top of that. Secondary Dressing: ALLEVYN Gentle Border, 3x3 (in/in) 3 x Per Week/15 Days Discharge Instructions: In clinic Apply over primary dressing as directed. Secondary Dressing: Woven Gauze Sponge, Non-Sterile 4x4 in 3 x Per Week/15 Days Discharge Instructions: In clinic Apply over primary dressing as directed. 1. I would recommend currently that we continue with the wound VAC as I feel like this is still the best way to go. 2. I am good recommend that we use Hydrofera Blue rope however to pack into the wound I cut this in half and then tapered it down to a quarter in the lower portion. This will allow for some growth in the base of the wound while still absorbing and suctioning out any fluid that is collecting in the base of the wound. The patient should do well with this. We will see patient back for reevaluation in 1 week here in the clinic. If anything worsens or changes patient will contact our office for additional recommendations. Electronic Signature(s) Signed: 03/10/2022 2:29:31 PM By: Worthy Keeler PA-C Entered By: Worthy Keeler on 03/10/2022 14:29:31 -------------------------------------------------------------------------------- SuperBill Details Patient Name: Date of Service: [name] 03/10/2022 Medical Record Number: [id number] Patient Account Number: [id number] Date of Birth/Sex: Treating RN: July 23, 1946 (76 y.o. [name], [name] Primary Care Provider: [name] Other Clinician: Referring Provider: Treating Provider/Extender: [name] in Treatment: 6 Diagnosis Coding ICD-10 Codes Code Description T81.31XA Disruption of external operation (surgical) wound, not elsewhere classified, initial encounter L98.492 Non-pressure chronic ulcer of skin of other sites with fat layer exposed E83.59 Other disorders of calcium metabolism L59.8  Other specified disorders of the skin and subcutaneous tissue related to radiation D68.61 Antiphospholipid syndrome I10 Essential (primary) hypertension I25.10 Atherosclerotic heart disease of native coronary artery without angina pectoris Facility Procedures CPT4 Code: [id number] Description: 99213 - WOUND CARE VISIT-LEV 3 EST PT Modifier: Quantity: 1 Physician Procedures : CPT4 Code Description Modifier [id number] - WC PHYS LEVEL 4 - EST PT ICD-10 Diagnosis Description T81.31XA Disruption of external operation (surgical) wound, not elsewhere classified, initial encounter L98.492 Non-pressure chronic ulcer of skin of  other sites with fat layer exposed E83.59 Other disorders of calcium metabolism L59.8 Other specified disorders of the skin and subcutaneous tissue related to radiation Quantity: 1 Electronic Signature(s) Signed: 03/10/2022 4:35:53 PM By: Worthy Keeler PA-C Signed: 03/16/2022 3:41:21 PM By: Rhae Hammock RN Previous Signature: 03/10/2022 2:29:48 PM Version By: Worthy Keeler PA-C Entered By: Rhae Hammock on 03/10/2022 14:30:07

## 2022-03-16 NOTE — Progress Notes (Signed)
[name], [name] ([id number]) Visit Report for 03/10/2022 Arrival Information Details Patient Name: Date of Service: [name], [name] 03/10/2022 1:00 PM Medical Record Number: [id number] Patient Account Number: [id number] Date of Birth/Sex: Treating RN: November 01, 1945 (76 y.o. [name], [name] Primary Care Enma Maeda: [name] Other Clinician: Referring Morganna Styles: Treating Eudell Mcphee/Extender: [name] in Treatment: 6 Visit Information History Since Last Visit Added or deleted any medications: No Patient Arrived: Ambulatory Any new allergies or adverse reactions: No Arrival Time: 13:22 Had a fall or experienced change in No Accompanied By: self activities of daily living that may affect Transfer Assistance: None risk of falls: Patient Identification Verified: Yes Signs or symptoms of abuse/neglect since last visito No Secondary Verification Process Completed: Yes Hospitalized since last visit: No Patient Requires Transmission-Based Precautions: No Implantable device outside of the clinic excluding No Patient Has Alerts: Yes cellular tissue based products placed in the center Patient Alerts: Patient on Blood Thinner since last visit: Has Dressing in Place as Prescribed: Yes Pain Present Now: No Electronic Signature(s) Signed: 03/10/2022 4:57:18 PM By: Erenest Blank Entered By: Erenest Blank on 03/10/2022 13:25:12 -------------------------------------------------------------------------------- Clinic Level of Care Assessment Details Patient Name: Date of Service: [name] 03/10/2022 1:00 PM Medical Record Number: [id number] Patient Account Number: [id number] Date of Birth/Sex: Treating RN: September 01, 1945 (76 y.o. [name], [name] Primary Care Koa Zoeller: [name] Other Clinician: Referring Nolberto Cheuvront: Treating Lakiah Dhingra/Extender: [name] in Treatment: 6 Clinic Level of Care Assessment Items TOOL 4  Quantity Score X- 1 0 Use when only an EandM is performed on FOLLOW-UP visit ASSESSMENTS - Nursing Assessment / Reassessment X- 1 10 Reassessment of Co-morbidities (includes updates in patient status) X- 1 5 Reassessment of Adherence to Treatment Plan ASSESSMENTS - Wound and Skin A ssessment / Reassessment X - Simple Wound Assessment / Reassessment - one wound 1 5 '[]'$  - 0 Complex Wound Assessment / Reassessment - multiple wounds '[]'$  - 0 Dermatologic / Skin Assessment (not related to wound area) ASSESSMENTS - Focused Assessment '[]'$  - 0 Circumferential Edema Measurements - multi extremities '[]'$  - 0 Nutritional Assessment / Counseling / Intervention '[]'$  - 0 Lower Extremity Assessment (monofilament, tuning fork, pulses) '[]'$  - 0 Peripheral Arterial Disease Assessment (using hand held doppler) ASSESSMENTS - Ostomy and/or Continence Assessment and Care '[]'$  - 0 Incontinence Assessment and Management '[]'$  - 0 Ostomy Care Assessment and Management (repouching, etc.) PROCESS - Coordination of Care X - Simple Patient / Family Education for ongoing care 1 15 '[]'$  - 0 Complex (extensive) Patient / Family Education for ongoing care X- 1 10 Staff obtains Programmer, systems, Records, T Results / Process Orders est X- 1 10 Staff telephones HHA, Nursing Homes / Clarify orders / etc '[]'$  - 0 Routine Transfer to another Facility (non-emergent condition) '[]'$  - 0 Routine Hospital Admission (non-emergent condition) '[]'$  - 0 New Admissions / Biomedical engineer / Ordering NPWT Apligraf, etc. , '[]'$  - 0 Emergency Hospital Admission (emergent condition) X- 1 10 Simple Discharge Coordination '[]'$  - 0 Complex (extensive) Discharge Coordination PROCESS - Special Needs '[]'$  - 0 Pediatric / Minor Patient Management '[]'$  - 0 Isolation Patient Management '[]'$  - 0 Hearing / Language / Visual special needs '[]'$  - 0 Assessment of Community assistance (transportation, D/C planning, etc.) '[]'$  - 0 Additional assistance / Altered  mentation '[]'$  - 0 Support Surface(s) Assessment (bed, cushion, seat, etc.) INTERVENTIONS - Wound Cleansing / Measurement X - Simple Wound Cleansing - one wound 1 5 '[]'$  - 0 Complex Wound  Cleansing - multiple wounds X- 1 5 Wound Imaging (photographs - any number of wounds) '[]'$  - 0 Wound Tracing (instead of photographs) X- 1 5 Simple Wound Measurement - one wound '[]'$  - 0 Complex Wound Measurement - multiple wounds INTERVENTIONS - Wound Dressings X - Small Wound Dressing one or multiple wounds 1 10 '[]'$  - 0 Medium Wound Dressing one or multiple wounds '[]'$  - 0 Large Wound Dressing one or multiple wounds X- 1 5 Application of Medications - topical '[]'$  - 0 Application of Medications - injection INTERVENTIONS - Miscellaneous '[]'$  - 0 External ear exam '[]'$  - 0 Specimen Collection (cultures, biopsies, blood, body fluids, etc.) '[]'$  - 0 Specimen(s) / Culture(s) sent or taken to Lab for analysis '[]'$  - 0 Patient Transfer (multiple staff / Civil Service fast streamer / Similar devices) '[]'$  - 0 Simple Staple / Suture removal (25 or less) '[]'$  - 0 Complex Staple / Suture removal (26 or more) '[]'$  - 0 Hypo / Hyperglycemic Management (close monitor of Blood Glucose) '[]'$  - 0 Ankle / Brachial Index (ABI) - do not check if billed separately X- 1 5 Vital Signs Has the patient been seen at the hospital within the last three years: Yes Total Score: 100 Level Of Care: New/Established - Level 3 Electronic Signature(s) Signed: 03/16/2022 3:41:21 PM By: Rhae Hammock RN Entered By: Rhae Hammock on 03/10/2022 14:29:54 -------------------------------------------------------------------------------- Encounter Discharge Information Details Patient Name: Date of Service: [name]. 03/10/2022 1:00 PM Medical Record Number: [id number] Patient Account Number: [id number] Date of Birth/Sex: Treating RN: 1945-08-05 (76 y.o. [name], [name] Primary Care Tyyne Cliett: [name] Other Clinician: Referring  Imogene Gravelle: Treating Marbella Markgraf/Extender: [name] in Treatment: 6 Encounter Discharge Information Items Discharge Condition: Stable Ambulatory Status: Ambulatory Discharge Destination: Home Transportation: Private Auto Accompanied By: self Schedule Follow-up Appointment: Yes Clinical Summary of Care: Patient Declined Electronic Signature(s) Signed: 03/16/2022 3:41:21 PM By: Rhae Hammock RN Entered By: Rhae Hammock on 03/10/2022 14:50:38 -------------------------------------------------------------------------------- Lower Extremity Assessment Details Patient Name: Date of Service: [name] 03/10/2022 1:00 PM Medical Record Number: [id number] Patient Account Number: [id number] Date of Birth/Sex: Treating RN: 11-21-1945 (76 y.o. [name], [name] Primary Care Vickey Ewbank: [name] Other Clinician: Referring Katianna Mcclenney: Treating Orel Hord/Extender: Renae Fickle Weeks in Treatment: 6 Electronic Signature(s) Signed: 03/10/2022 4:57:18 PM By: Erenest Blank Signed: 03/16/2022 3:41:21 PM By: Rhae Hammock RN Entered By: Erenest Blank on 03/10/2022 13:30:57 -------------------------------------------------------------------------------- Multi-Disciplinary Care Plan Details Patient Name: Date of Service: [name], [name] 03/10/2022 1:00 PM Medical Record Number: [id number] Patient Account Number: [id number] Date of Birth/Sex: Treating RN: [birth year] (76 y.o. [name], [name] Primary Care Octavious Zidek: [name] Other Clinician: Referring Nat Lowenthal: Treating Christen Bedoya/Extender: [name] in Treatment: 6 Active Inactive Wound/Skin Impairment Nursing Diagnoses: Impaired tissue integrity Knowledge deficit related to ulceration/compromised skin integrity Goals: Patient will have a decrease in wound volume by X% from date: (specify in notes) Date Initiated: 01/27/2022 Target  Resolution Date: 03/27/2022 Goal Status: Active Patient/caregiver will verbalize understanding of skin care regimen Date Initiated: 01/27/2022 Target Resolution Date: 03/27/2022 Goal Status: Active Ulcer/skin breakdown will have a volume reduction of 30% by week 4 Date Initiated: 01/27/2022 Target Resolution Date: 03/27/2022 Goal Status: Active Interventions: Assess patient/caregiver ability to obtain necessary supplies Assess patient/caregiver ability to perform ulcer/skin care regimen upon admission and as needed Assess ulceration(s) every visit Notes: Electronic Signature(s) Signed: 03/16/2022 3:41:21 PM By: Rhae Hammock RN Entered By: Rhae Hammock on 03/10/2022 13:39:10 -------------------------------------------------------------------------------- Pain Assessment Details Patient  Name: Date of Service: [name], [name] 03/10/2022 1:00 PM Medical Record Number: [id number] Patient Account Number: [id number] Date of Birth/Sex: Treating RN: [birth year] (76 y.o. [name], [name] Primary Care Dayvin Aber: [name] Other Clinician: Referring Neville Walston: Treating Lonzo Saulter/Extender: Renae Fickle Weeks in Treatment: 6 Active Problems Location of Pain Severity and Description of Pain Patient Has Paino No Site Locations Pain Management and Medication Current Pain Management: Electronic Signature(s) Signed: 03/10/2022 4:57:18 PM By: Erenest Blank Signed: 03/16/2022 3:41:21 PM By: Rhae Hammock RN Entered By: Erenest Blank on 03/10/2022 13:30:49 -------------------------------------------------------------------------------- Patient/Caregiver Education Details Patient Name: Date of Service: [name] 8/16/2023andnbsp1:00 PM Medical Record Number: [id number] Patient Account Number: [id number] Date of Birth/Gender: Treating RN: Sep 24, 1945 (76 y.o. [name], [name] Primary Care Physician: [name] Other Clinician: Referring  Physician: Treating Physician/Extender: [name] in Treatment: 6 Education Assessment Education Provided To: Patient Education Topics Provided Wound/Skin Impairment: Methods: Explain/Verbal Responses: Reinforcements needed, State content correctly Electronic Signature(s) Signed: 03/16/2022 3:41:21 PM By: Rhae Hammock RN Entered By: Rhae Hammock on 03/10/2022 13:39:44 -------------------------------------------------------------------------------- Wound Assessment Details Patient Name: Date of Service: [name]. 03/10/2022 1:00 PM Medical Record Number: [id number] Patient Account Number: [id number] Date of Birth/Sex: Treating RN: [birth year] (76 y.o. 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 Etiology: Date Acquired: 08/26/2021 Wound Open Weeks Of Treatment: 6 Status: Clustered Wound: No Comorbid Asthma, Deep Vein Thrombosis, Hypertension, Peripheral Arterial History: Disease, Hepatitis B, Osteoarthritis, Neuropathy, Received Radiation Photos Wound Measurements Length: (cm) 0.4 Width: (cm) 0.7 Depth: (cm) 0.7 Area: (cm) 0.22 Volume: (cm) 0.154 % Reduction in Area: -209.9% % Reduction in Volume: -116.9% Epithelialization: Small (1-33%) Tunneling: Yes Position (o'clock): 10 Maximum Distance: (cm) 1.8 Undermining: No Wound Description Classification: Full Thickness With Exposed Support Structures Wound Margin: Distinct, outline attached Exudate Amount: Large Exudate Type: Serosanguineous Exudate Color: red, brown Foul Odor After Cleansing: No Slough/Fibrino No Wound Bed Granulation Amount: Large (67-100%) Exposed  Structure Granulation Quality: Red Fascia Exposed: No Necrotic Amount: None Present (0%) Fat Layer (Subcutaneous Tissue) Exposed: Yes Tendon Exposed: No Muscle Exposed: No Joint Exposed: No Bone Exposed: No Treatment Notes Wound #1 (Breast) Wound Laterality: Right Cleanser Wound Cleanser Discharge Instruction: Cleanse the wound with wound cleanser prior to applying a clean dressing using gauze sponges, not tissue or cotton balls. Peri-Wound Care Topical Primary Dressing Hydrofera Blue Classic Foam Rope Dressing, 9x6 (mm/in) Discharge Instruction: Pack gently into wound bed, leaving tail(wider part) out of wound. Moisten with saline after packing in. Then apply wound vac over top of that. Secondary Dressing ALLEVYN Gentle Border, 3x3 (in/in) Discharge Instruction: In clinic Apply over primary dressing as directed. Woven Gauze Sponge, Non-Sterile 4x4 in Discharge Instruction: In clinic Apply over primary dressing as directed. Secured With Compression Wrap Compression Stockings Environmental education officer) Signed: 03/10/2022 4:57:18 PM By: Erenest Blank Signed: 03/16/2022 3:41:21 PM By: Rhae Hammock RN Entered By: Erenest Blank on 03/10/2022 13:41:16 -------------------------------------------------------------------------------- Vitals Details Patient Name: Date of Service: [name]. 03/10/2022 1:00 PM Medical Record Number: [id number] Patient Account Number: [id number] Date of Birth/Sex: Treating RN: March 28, 1946 (76 y.o. [name] Primary Care Bentley Haralson: [name] Other Clinician: Referring Nana Hoselton: Treating Gracie Gupta/Extender: Renae Fickle Weeks in Treatment: 6 Vital Signs Time Taken: 13:26 Temperature (F): 98.7 Height (  in): 64 Pulse (bpm): 72 Weight (lbs): 180 Respiratory Rate (breaths/min): 18 Body Mass Index (BMI): 30.9 Blood Pressure (mmHg): 125/84 Reference Range: 80 - 120 mg / dl Electronic  Signature(s) Signed: 03/10/2022 4:57:18 PM By: Erenest Blank Entered By: Erenest Blank on 03/10/2022 13:30:23

## 2022-03-17 ENCOUNTER — Encounter (HOSPITAL_BASED_OUTPATIENT_CLINIC_OR_DEPARTMENT_OTHER): Payer: Medicare HMO | Admitting: Physician Assistant

## 2022-03-17 DIAGNOSIS — T8131XA Disruption of external operation (surgical) wound, not elsewhere classified, initial encounter: Secondary | ICD-10-CM | POA: Diagnosis not present

## 2022-03-18 NOTE — Progress Notes (Signed)
[name], [name] ([id number]) Visit Report for 03/17/2022 Chief Complaint Document Details Patient Name: Date of Service: [name], [name] 03/17/2022 1:45 PM Medical Record Number: [id number] Patient Account Number: [id number] Date of Birth/Sex: Treating RN: 01/15/1946 (76 y.o. [name], [name] Primary Care Provider: [name] Other Clinician: Referring Provider: Treating Provider/Extender: [name] in Treatment: 7 Information Obtained from: Patient Chief Complaint Right breast ulcer Electronic Signature(s) Signed: 03/17/2022 2:26:16 PM By: Worthy Keeler PA-C Entered By: Worthy Keeler on 03/17/2022 14:26:16 -------------------------------------------------------------------------------- HPI Details Patient Name: Date of Service: [name], [name] 03/17/2022 1:45 PM Medical Record Number: [id number] Patient Account Number: [id number] Date of Birth/Sex: Treating RN: [birth year] (76 y.o. [name], [name] Primary Care Provider: [name] Other Clinician: Referring Provider: Treating Provider/Extender: [name] in Treatment: 7 History of Present Illness HPI Description: 01-27-2022 upon evaluation today patient presents for initial evaluation here in our clinic concerning issues she has been having with her right breast. She had an area of calcium buildup where she had actual staples that were being pushed out that were noted from her previous breast surgery which was roughly 23 years ago when she had cancer. At that point she had also undergone radiation therapy. 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 Nonetheless after her most recent surgery in February to remove the staples and the calcium deposit this has started to build back up due to the damage to the breast tissue. She was seen initially by Dr. Brantley Stage. Eventually he referred her to Dr. Rebekah Chesterfield to see about a  mastectomy to take care of the issue. With that being said this was something that was good to be reserved just for worst-case scenario in fact they were a little bit concerned about her reaction considering her blood thinners and the fact that she may not do as well and surgery is what they would like to see with all the other medical problems that she has. For that reason they referred her to [name] in order to see what we can do from a healing standpoint. 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 02-03-2022 upon evaluation today patient appears to be doing okay in regard to her wound on the breast. Fortunately I do not see any signs of worsening. Unfortunately I do believe that she is still having some discomfort also there was some misunderstanding about how to do the dressing which I think will make it a little bit easier she should be putting it in before waiting it and then after its in and situated she can wet this. I think that is going to be a lot easier for her than what she has been trying to do. 02-10-2022 upon evaluation today patient appears to be doing about the same in regard to her wound. She has been tolerating the dressing changes without complication. We are actually starting her on linezolid today actually had to apply to get this approved by her insurance I am hopeful that this will be affordable for her now it was going to cost her way too much before. With that being said I do believe that she is headed  in the right direction which is great news. No fevers, chills, nausea, vomiting, or diarrhea. 7/26; right breast apparently secondary to surgery earlier this year for protruding  staples and underlying traumatic calcifications. She has a nonhealing wound with some depth and tunneling. She has been using Hydrofera Blue with some improvement in measurements today. Nevertheless she has a wound VAC and we went ahead and applied this. I am not certain whether she is going to be eligible for home health but her staff are certainly going to work on it 02-24-2022 upon evaluation today patient appears to be doing excellent she is using the gauze VAC at this point and it seems to be doing a great job for her. Fortunately there does not appear to be any signs of active infection at this time which is excellent. No fever or chills noted 03-10-2022 upon evaluation today patient appears to be doing well with regard to her wound although the wound was definitely over packed compared to what it needs to be. I think they were probably got to switch to [facility] Blue rope which I think would be much easier to put him than the current black foam which is what home health is actually using we will try to do a gauze VAC but to be honest there is not actually doing that. Fortunately there does not appear to be any signs of active infection locally or systemically at this time which is great news. I think the biggest issue I see is simply that we need to make sure and have the patient have the Hydrofera Blue rope which is again I think a much better way to go with this and avoid altogether the use of the black foam inside the wound and put a piece on the outside that is appropriate and fine. 03-17-2022 upon evaluation today patient appears to be doing well currently in regard to her wound. The wound VAC does seem to be helping the [facility] seems to be much better and very pleased in this regard. I do not see any signs of active infection locally or systemically at this time which is great news. No fevers, chills, nausea, vomiting, or diarrhea. Electronic Signature(s) Signed: 03/17/2022 2:36:26 PM  By: Worthy Keeler PA-C Entered By: Worthy Keeler on 03/17/2022 14:36:25 -------------------------------------------------------------------------------- Physical Exam Details Patient Name: Date of Service: [name], [name] 03/17/2022 1:45 PM Medical Record Number: [id number] Patient Account Number: [id number] Date of Birth/Sex: Treating RN: [birth year] (76 y.o. [name] Primary Care Provider: [name] Other Clinician: Referring Provider: Treating Provider/Extender: Renae Fickle Weeks in Treatment: 7 Constitutional Well-nourished and well-hydrated in no acute distress. Respiratory normal breathing without difficulty. Psychiatric this patient is able to make decisions and demonstrates good insight into disease process. Alert and Oriented x 3. pleasant and cooperative. Notes Upon inspection patient's wound bed actually showed signs of good granulation epithelization at this point. Fortunately I see no evidence of infection locally or systemically which is great news and overall I am very pleased with where things stand currently. Electronic Signature(s) Signed: 03/17/2022 2:36:48 PM By: Worthy Keeler PA-C Entered By: Worthy Keeler on 03/17/2022 14:36:48 -------------------------------------------------------------------------------- Physician Orders Details Patient Name: Date of Service: [name], [name] 03/17/2022 1:45 PM Medical Record Number: [id number] Patient Account Number: [id number] Date of Birth/Sex: Treating RN: [birth year] (76 y.o. [name], [name] Primary Care Provider: [name] Other Clinician: Referring Provider: Treating Provider/Extender: [name] in Treatment: 7 Verbal /  Phone Orders: No Diagnosis Coding Follow-up Appointments ppointment in 1 week. - 03/24/22 @ 1430 w/ Jeri Cos and Allayne Butcher Rm # 9 Return A Negative Presssure Wound Therapy Wound Vac to wound continuously at  170m/hg pressure - twice a week dressing changes. Apply the black foam over the hydraferablue. Home Health New wound care orders this week; continue Home Health for wound care. May utilize formulary equivalent dressing for wound treatment orders unless otherwise specified. - **********Home Health to change wound vac dressings twice a week- Mondays and Thursdays. Patient to come to wound center on Wednesdays- a wet to dry dressing will be applied and the following day (Thursday) home health will go back out to apply wound vac. ***Change gauze packing to hydraferablue rope packing*** Other Home Health Orders/Instructions: - [facility] home [facility] Wound Treatment Wound #1 - Breast Wound Laterality: Right Cleanser: Wound Cleanser (Home Health) (Generic) 3 x Per Week/15 Days Discharge Instructions: Cleanse the wound with wound cleanser prior to applying a clean dressing using gauze sponges, not tissue or cotton balls. Prim Dressing: Hydrofera Blue Classic Foam Rope Dressing, 9x6 (mm/in) (Home Health) 3 x Per Week/15 Days ary Discharge Instructions: Pack gently into wound bed, leaving tail(wider part) out of wound. Moisten with saline after packing in. Then apply wound vac over top of that. Secondary Dressing: ALLEVYN Gentle Border, 3x3 (in/in) 3 x Per Week/15 Days Discharge Instructions: In clinic Apply over primary dressing as directed. Secondary Dressing: Woven Gauze Sponge, Non-Sterile 4x4 in 3 x Per Week/15 Days Discharge Instructions: In clinic Apply over primary dressing as directed. Electronic Signature(s) Signed: 03/17/2022 4:23:52 PM By: SWorthy KeelerPA-C Signed: 03/18/2022 3:58:21 PM By: BRhae HammockRN Entered By: BRhae Hammockon 03/17/2022 14:38:06 -------------------------------------------------------------------------------- Problem List Details Patient Name: Date of Service: [name], AXE8/23/2023 1:45 PM Medical Record Number: [id number] Account Number:  [id number] of Birth/Sex: Treating RN: 1Jan 18, 1947(76y.o. FTonita Phoenix [name] Primary Care Provider: KBing MatterOther Clinician: Referring Provider: Treating Provider/Extender: SOdella Aquasin Treatment: 7 Active Problems ICD-10 Encounter Code Description Active Date MDM Diagnosis T81.31XA Disruption of external operation (surgical) wound, not elsewhere classified, 01/27/2022 No Yes initial encounter L98.492 Non-pressure chronic ulcer of skin of other sites with fat layer exposed 01/27/2022 No Yes E83.59 Other disorders of calcium metabolism 01/27/2022 No Yes L59.8 Other specified disorders of the skin and subcutaneous tissue related to 01/27/2022 No Yes radiation D68.61 Antiphospholipid syndrome 01/27/2022 No Yes I10 Essential (primary) hypertension 01/27/2022 No Yes I25.10 Atherosclerotic heart disease of native coronary artery without angina pectoris 01/27/2022 No Yes Inactive Problems Resolved Problems Electronic Signature(s) Signed: 03/17/2022 2:26:10 PM By: SWorthy KeelerPA-C Entered By: SWorthy Keeleron 03/17/2022 14:26:10 -------------------------------------------------------------------------------- Progress Note Details Patient Name: Date of Service: HSHWANDA, SOLTIS8/23/2023 1:45 PM Medical Record Number: [id number] Account Number: [id number] of Birth/Sex: Treating RN: 106/09/1945(76y.o. FTonita Phoenix [name] Primary Care Provider: KBing MatterOther Clinician: Referring Provider: Treating Provider/Extender: SOdella Aquasin Treatment: 7 Subjective Chief Complaint Information obtained from Patient Right breast ulcer History of Present Illness (HPI) 01-27-2022 upon evaluation today patient presents for initial evaluation here in our clinic concerning issues she has been having with her right breast. She had an area of calcium buildup where she had actual staples that were being pushed out that  were noted from her previous breast surgery which was roughly 23 years ago when she had cancer. At that point she had also  undergone radiation therapy. 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 Nonetheless after her most recent surgery in February to remove the staples and the calcium deposit this has started to build back up due to the damage to the breast tissue. She was seen initially by Dr. Brantley Stage. Eventually he referred her to Dr. Rebekah Chesterfield to see about a mastectomy to take care of the issue. With that being said this was something that was good to be reserved just for worst-case scenario in fact they were a little bit concerned about her reaction considering her blood thinners and the fact that she may not do as well and surgery is what they would like to see with all the other medical problems that she has. For that reason they referred her to [name] in order to see what we can do from a healing standpoint. 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 02-03-2022 upon evaluation today patient appears to be doing okay in regard to her wound on the breast. Fortunately I do not see any signs of worsening. Unfortunately I do believe that she is still having some discomfort also there was some misunderstanding about how to do the dressing which I think will make it a little bit easier she should be putting it in before waiting it and then after its in and situated she can wet this. I think that is going to be a lot easier for her than what she has been trying to do. 02-10-2022 upon evaluation today patient appears to be doing about the same in regard to her wound. She  has been tolerating the dressing changes without complication. We are actually starting her on linezolid today actually had to apply to get this approved by her insurance I am hopeful that this will be affordable for her now it was going to cost her way too much before. With that being said I do believe that she is headed in the right direction which is great news. No fevers, chills, nausea, vomiting, or diarrhea. 7/26; right breast apparently secondary to surgery earlier this year for protruding staples and underlying traumatic calcifications. She has a nonhealing wound with some depth and tunneling. She has been using Hydrofera Blue with some improvement in measurements today. Nevertheless she has a wound VAC and we went ahead and applied this. I am not certain whether she is going to be eligible for home health but her staff are certainly going to work on it 02-24-2022 upon evaluation today patient appears to be doing excellent she is using the gauze VAC at this point and it seems to be doing a great job for her. Fortunately there does not appear to be any signs of active infection at this time which is excellent. No fever or chills noted 03-10-2022 upon evaluation today patient appears to be doing well with regard to her wound although the wound was definitely over packed compared to what it needs to be. I think they were probably got to switch to [facility] Blue rope which I think would be much easier to put him than the current black foam which is what home health is actually using we will try to do  a gauze VAC but to be honest there is not actually doing that. Fortunately there does not appear to be any signs of active infection locally or systemically at this time which is great news. I think the biggest issue I see is simply that we need to make sure and have the patient have the Hydrofera Blue rope which is again I think a much better way to go with this and avoid altogether the use of the black  foam inside the wound and put a piece on the outside that is appropriate and fine. 03-17-2022 upon evaluation today patient appears to be doing well currently in regard to her wound. The wound VAC does seem to be helping the [facility] seems to be much better and very pleased in this regard. I do not see any signs of active infection locally or systemically at this time which is great news. No fevers, chills, nausea, vomiting, or diarrhea. Objective Constitutional Well-nourished and well-hydrated in no acute distress. Vitals Time Taken: 2:05 PM, Height: 64 in, Weight: 180 lbs, BMI: 30.9, Temperature: 98.7 F, Pulse: 66 bpm, Respiratory Rate: 17 breaths/min, Blood Pressure: 157/84 mmHg. Respiratory normal breathing without difficulty. Psychiatric this patient is able to make decisions and demonstrates good insight into disease process. Alert and Oriented x 3. pleasant and cooperative. General Notes: Upon inspection patient's wound bed actually showed signs of good granulation epithelization at this point. Fortunately I see no evidence of infection locally or systemically which is great news and overall I am very pleased with where things stand currently. Integumentary (Hair, Skin) Wound #1 status is Open. Original cause of wound was Surgical Injury. The date acquired was: 08/26/2021. The wound has been in treatment 7 weeks. The wound is located on the Right Breast. The wound measures 0.4cm length x 0.4cm width x 0.7cm depth; 0.126cm^2 area and 0.088cm^3 volume. There is Fat Layer (Subcutaneous Tissue) exposed. There is no undermining noted, however, there is tunneling at 7:00 with a maximum distance of 3.4cm. There is a large amount of serosanguineous drainage noted. The wound margin is distinct with the outline attached to the wound base. There is large (67-100%) red granulation within the wound bed. There is no necrotic tissue within the wound bed. 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 - 03/17/22 @ 1430 w/ Jeri Cos and [place] Rm # 9 Negative Presssure Wound Therapy: Wound Vac to wound continuously at 173m/hg pressure - twice a week dressing changes. Apply the black foam over the hydraferablue. Home Health: New wound care orders this week; continue Home Health for wound care. May utilize formulary equivalent dressing for wound treatment orders unless otherwise specified. - **********Home Health to change wound vac dressings twice a week- Mondays and Thursdays. Patient to come to wound center on Wednesdays- a wet to dry dressing will be applied and the following day (Thursday) home health will go back out to apply wound vac. ***Change gauze packing to hydraferablue rope packing*** Other Home Health Orders/Instructions: - [facility] home health WOUND #1: - Breast Wound Laterality: Right Cleanser: Wound Cleanser (Home Health) (Generic) 3 x Per Week/15 Days Discharge Instructions: Cleanse the wound with wound  cleanser prior to applying a clean dressing using gauze sponges, not tissue or cotton balls. Prim Dressing: Hydrofera Blue Classic Foam Rope Dressing, 9x6 (mm/in) (Home Health) 3 x Per Week/15 Days ary Discharge Instructions: Pack gently into wound bed, leaving tail(wider part) out of wound. Moisten with saline after packing in. Then apply wound vac over top of that. Secondary Dressing: ALLEVYN Gentle Border, 3x3 (in/in) 3 x Per Week/15 Days Discharge Instructions: In clinic  Apply over primary dressing as directed. Secondary Dressing: Woven Gauze Sponge, Non-Sterile 4x4 in 3 x Per Week/15 Days Discharge Instructions: In clinic Apply over primary dressing as directed. 1. I would recommend that we continue with the wound vacs using the [facility] which I think is doing quite well. 2. Also can recommend that we have the patient continue to monitor for any signs of worsening or infection. Right now I really think that the wound VAC is still appropriate a lot of the area internally has closed down much tighter I think this is doing really really well. We will see patient back for reevaluation in 1 week here in the clinic. If anything worsens or changes patient will contact our office for additional recommendations. Electronic Signature(s) Signed: 03/17/2022 2:37:17 PM By: Worthy Keeler PA-C Entered By: Worthy Keeler on 03/17/2022 14:37:17 -------------------------------------------------------------------------------- SuperBill Details Patient Name: Date of Service: [name], [name] 03/17/2022 Medical Record Number: [id number] Patient Account Number: [id number] Date of Birth/Sex: Treating RN: 1945-10-23 (76 y.o. [name], [name] Primary Care Provider: [name] Other Clinician: Referring Provider: Treating Provider/Extender: [name] in Treatment: 7 Diagnosis Coding ICD-10 Codes Code Description T81.31XA Disruption of external operation (surgical) wound, not elsewhere classified, initial encounter L98.492 Non-pressure chronic ulcer of skin of other sites with fat layer exposed E83.59 Other disorders of calcium metabolism L59.8 Other specified disorders of the skin and subcutaneous tissue related to radiation D68.61 Antiphospholipid syndrome I10 Essential (primary) hypertension I25.10 Atherosclerotic heart disease of native coronary artery without angina pectoris Facility Procedures CPT4 Code:  [id number] Description: 99213 - WOUND CARE VISIT-LEV 3 EST PT Modifier: Quantity: 1 Physician Procedures : CPT4 Code Description Modifier [id number] - WC PHYS LEVEL 4 - EST PT ICD-10 Diagnosis Description T81.31XA Disruption of external operation (surgical) wound, not elsewhere classified, initial encounter L98.492 Non-pressure chronic ulcer of skin of  other sites with fat layer exposed E83.59 Other disorders of calcium metabolism L59.8 Other specified disorders of the skin and subcutaneous tissue related to radiation Quantity: 1 Electronic Signature(s) Signed: 03/17/2022 4:23:52 PM By: Worthy Keeler PA-C Signed: 03/18/2022 3:58:21 PM By: Rhae Hammock RN Previous Signature: 03/17/2022 2:37:27 PM Version By: Worthy Keeler PA-C Entered By: Rhae Hammock on 03/17/2022 14:53:33

## 2022-03-18 NOTE — Progress Notes (Signed)
[name], [name] ([id number]) Visit Report for 03/17/2022 Arrival Information Details Patient Name: Date of Service: [name], [name] 03/17/2022 1:45 PM Medical Record Number: [id number] Patient Account Number: [id number] Date of Birth/Sex: Treating RN: 13-Sep-1945 (76 y.o. [name], [name] Primary Care Lochlan Grygiel: [name] Other Clinician: Referring Benjy Kana: Treating Mariena Meares/Extender: [name] in Treatment: 7 Visit Information History Since Last Visit Added or deleted any medications: No Patient Arrived: Ambulatory Any new allergies or adverse reactions: No Arrival Time: 14:04 Had a fall or experienced change in No Accompanied By: self activities of daily living that may affect Transfer Assistance: None risk of falls: Patient Identification Verified: Yes Signs or symptoms of abuse/neglect since last visito No Secondary Verification Process Completed: Yes Hospitalized since last visit: No Patient Requires Transmission-Based Precautions: No Implantable device outside of the clinic excluding No Patient Has Alerts: Yes cellular tissue based products placed in the center Patient Alerts: Patient on Blood Thinner since last visit: Has Dressing in Place as Prescribed: Yes Pain Present Now: No Electronic Signature(s) Signed: 03/18/2022 3:58:21 PM By: Rhae Hammock RN Entered By: Rhae Hammock on 03/17/2022 14:05:19 -------------------------------------------------------------------------------- Clinic Level of Care Assessment Details Patient Name: Date of Service: [name], [name] 03/17/2022 1:45 PM Medical Record Number: [id number] Patient Account Number: [id number] Date of Birth/Sex: Treating RN: 04/18/1946 (76 y.o. [name], [name] Primary Care Arynn Armand: [name] Other Clinician: Referring Baldomero Mirarchi: Treating Nevaen Tredway/Extender: [name] in Treatment: 7 Clinic Level of Care Assessment  Items TOOL 4 Quantity Score X- 1 0 Use when only an EandM is performed on FOLLOW-UP visit ASSESSMENTS - Nursing Assessment / Reassessment X- 1 10 Reassessment of Co-morbidities (includes updates in patient status) X- 1 5 Reassessment of Adherence to Treatment Plan ASSESSMENTS - Wound and Skin A ssessment / Reassessment X - Simple Wound Assessment / Reassessment - one wound 1 5 '[]'$  - 0 Complex Wound Assessment / Reassessment - multiple wounds '[]'$  - 0 Dermatologic / Skin Assessment (not related to wound area) ASSESSMENTS - Focused Assessment '[]'$  - 0 Circumferential Edema Measurements - multi extremities '[]'$  - 0 Nutritional Assessment / Counseling / Intervention '[]'$  - 0 Lower Extremity Assessment (monofilament, tuning fork, pulses) '[]'$  - 0 Peripheral Arterial Disease Assessment (using hand held doppler) ASSESSMENTS - Ostomy and/or Continence Assessment and Care '[]'$  - 0 Incontinence Assessment and Management '[]'$  - 0 Ostomy Care Assessment and Management (repouching, etc.) PROCESS - Coordination of Care X - Simple Patient / Family Education for ongoing care 1 15 '[]'$  - 0 Complex (extensive) Patient / Family Education for ongoing care X- 1 10 Staff obtains Programmer, systems, Records, T Results / Process Orders est X- 1 10 Staff telephones HHA, Nursing Homes / Clarify orders / etc '[]'$  - 0 Routine Transfer to another Facility (non-emergent condition) '[]'$  - 0 Routine Hospital Admission (non-emergent condition) '[]'$  - 0 New Admissions / Biomedical engineer / Ordering NPWT Apligraf, etc. , '[]'$  - 0 Emergency Hospital Admission (emergent condition) X- 1 10 Simple Discharge Coordination '[]'$  - 0 Complex (extensive) Discharge Coordination PROCESS - Special Needs '[]'$  - 0 Pediatric / Minor Patient Management '[]'$  - 0 Isolation Patient Management '[]'$  - 0 Hearing / Language / Visual special needs '[]'$  - 0 Assessment of Community assistance (transportation, D/C planning, etc.) '[]'$  - 0 Additional  assistance / Altered mentation '[]'$  - 0 Support Surface(s) Assessment (bed, cushion, seat, etc.) INTERVENTIONS - Wound Cleansing / Measurement X - Simple Wound Cleansing - one wound 1 5 '[]'$  - 0 Complex  Wound Cleansing - multiple wounds X- 1 5 Wound Imaging (photographs - any number of wounds) '[]'$  - 0 Wound Tracing (instead of photographs) X- 1 5 Simple Wound Measurement - one wound '[]'$  - 0 Complex Wound Measurement - multiple wounds INTERVENTIONS - Wound Dressings X - Small Wound Dressing one or multiple wounds 1 10 '[]'$  - 0 Medium Wound Dressing one or multiple wounds '[]'$  - 0 Large Wound Dressing one or multiple wounds X- 1 5 Application of Medications - topical '[]'$  - 0 Application of Medications - injection INTERVENTIONS - Miscellaneous '[]'$  - 0 External ear exam '[]'$  - 0 Specimen Collection (cultures, biopsies, blood, body fluids, etc.) '[]'$  - 0 Specimen(s) / Culture(s) sent or taken to Lab for analysis '[]'$  - 0 Patient Transfer (multiple staff / Civil Service fast streamer / Similar devices) '[]'$  - 0 Simple Staple / Suture removal (25 or less) '[]'$  - 0 Complex Staple / Suture removal (26 or more) '[]'$  - 0 Hypo / Hyperglycemic Management (close monitor of Blood Glucose) '[]'$  - 0 Ankle / Brachial Index (ABI) - do not check if billed separately X- 1 5 Vital Signs Has the patient been seen at the hospital within the last three years: Yes Total Score: 100 Level Of Care: New/Established - Level 3 Electronic Signature(s) Signed: 03/18/2022 3:58:21 PM By: Rhae Hammock RN Entered By: Rhae Hammock on 03/17/2022 14:53:01 -------------------------------------------------------------------------------- Encounter Discharge Information Details Patient Name: Date of Service: [name] 03/17/2022 1:45 PM Medical Record Number: [id number] Patient Account Number: [id number] Date of Birth/Sex: Treating RN: February 21, 1946 (76 y.o. [name], [name] Primary Care Sophiana Milanese: [name] Other  Clinician: Referring Oluwaseun Cremer: Treating Dedria Endres/Extender: [name] in Treatment: 7 Encounter Discharge Information Items Discharge Condition: Stable Ambulatory Status: Ambulatory Discharge Destination: Home Transportation: Private Auto Accompanied By: self Schedule Follow-up Appointment: Yes Clinical Summary of Care: Patient Declined Electronic Signature(s) Signed: 03/18/2022 3:58:21 PM By: Rhae Hammock RN Entered By: Rhae Hammock on 03/17/2022 14:54:12 -------------------------------------------------------------------------------- Lower Extremity Assessment Details Patient Name: Date of Service: [name], [name] 03/17/2022 1:45 PM Medical Record Number: [id number] Patient Account Number: [id number] Date of Birth/Sex: Treating RN: [birth year] (76 y.o. [name], [name] Primary Care Lacrisha Bielicki: [name] Other Clinician: Referring Collin Hendley: Treating Naol Ontiveros/Extender: [name] in Treatment: 7 Electronic Signature(s) Signed: 03/18/2022 3:58:21 PM By: Rhae Hammock RN Entered By: Rhae Hammock on 03/17/2022 14:06:06 -------------------------------------------------------------------------------- Multi-Disciplinary Care Plan Details Patient Name: Date of Service: [name], [name] 03/17/2022 1:45 PM Medical Record Number: [id number] Patient Account Number: [id number] Date of Birth/Sex: Treating RN: 1945/09/06 (76 y.o. [name], [name] Primary Care Galo Sayed: [name] Other Clinician: Referring Soul Hackman: Treating Tayten Heber/Extender: [name] in Treatment: 7 Active Inactive Wound/Skin Impairment Nursing Diagnoses: Impaired tissue integrity Knowledge deficit related to ulceration/compromised skin integrity Goals: Patient will have a decrease in wound volume by X% from date: (specify in notes) Date Initiated: 01/27/2022 Target Resolution Date:  03/27/2022 Goal Status: Active Patient/caregiver will verbalize understanding of skin care regimen Date Initiated: 01/27/2022 Target Resolution Date: 03/27/2022 Goal Status: Active Ulcer/skin breakdown will have a volume reduction of 30% by week 4 Date Initiated: 01/27/2022 Target Resolution Date: 03/27/2022 Goal Status: Active Interventions: Assess patient/caregiver ability to obtain necessary supplies Assess patient/caregiver ability to perform ulcer/skin care regimen upon admission and as needed Assess ulceration(s) every visit Notes: Electronic Signature(s) Signed: 03/18/2022 3:58:21 PM By: Rhae Hammock RN Entered By: Rhae Hammock on 03/17/2022 14:17:08 -------------------------------------------------------------------------------- Pain Assessment Details Patient Name: Date of Service: [name] Harp  P. 03/17/2022 1:45 PM Medical Record Number: [id number] Patient Account Number: [id number] Date of Birth/Sex: Treating RN: [birth year] (76 y.o. [name], [name] Primary Care Shavon Zenz: [name] Other Clinician: Referring Adya Wirz: Treating Monti Villers/Extender: [name] in Treatment: 7 Active Problems Location of Pain Severity and Description of Pain Patient Has Paino Yes Site Locations Pain Location: Pain Location: Generalized Pain, Pain in Ulcers With Dressing Change: Yes Duration of the Pain. Constant / Intermittento Intermittent Rate the pain. Current Pain Level: 6 Worst Pain Level: 10 Least Pain Level: 0 Tolerable Pain Level: 6 Character of Pain Describe the Pain: Aching Pain Management and Medication Current Pain Management: Medication: No Cold Application: No Rest: No Massage: No Activity: No T.E.N.S.: No Heat Application: No Leg drop or elevation: No Is the Current Pain Management Adequate: Adequate How does your wound impact your activities of daily livingo Sleep: No Bathing: No Appetite: No Relationship With  Others: No Bladder Continence: No Emotions: No Bowel Continence: No Work: No Toileting: No Drive: No Dressing: No Hobbies: No Electronic Signature(s) Signed: 03/18/2022 3:58:21 PM By: Rhae Hammock RN Entered By: Rhae Hammock on 03/17/2022 14:06:51 -------------------------------------------------------------------------------- Patient/Caregiver Education Details Patient Name: Date of Service: [name] 8/23/2023andnbsp1:45 PM Medical Record Number: [id number] Patient Account Number: [id number] Date of Birth/Gender: Treating RN: 1946-01-08 (76 y.o. [name] Primary Care Physician: [name] Other Clinician: Referring Physician: Treating Physician/Extender: [name] in Treatment: 7 Education Assessment Education Provided To: Patient Education Topics Provided Wound/Skin Impairment: Methods: Explain/Verbal Responses: State content correctly Electronic Signature(s) Signed: 03/18/2022 3:58:21 PM By: Rhae Hammock RN Entered By: Rhae Hammock on 03/17/2022 14:17:20 -------------------------------------------------------------------------------- Wound Assessment Details Patient Name: Date of Service: [name], [name] 03/17/2022 1:45 PM Medical Record Number: [id number] Patient Account Number: [id number] Date of Birth/Sex: Treating RN: 1946-05-05 (76 y.o. [name], [name] Primary Care Sachi Boulay: [name] Other Clinician: Referring Saraphina Lauderbaugh: Treating Ernan Runkles/Extender: Renae Fickle Weeks in Treatment: 7 Wound Status Wound Number: 1 Primary Open Surgical Wound Etiology: Wound Location: Right Breast Secondary Calciphylaxis Wounding Event: Surgical Injury Etiology: Date Acquired: 08/26/2021 Wound Open Weeks Of Treatment: 7 Status: Clustered Wound: No Comorbid Asthma, Deep Vein Thrombosis, Hypertension, Peripheral Arterial History: Disease, Hepatitis B, Osteoarthritis,  Neuropathy, Received Radiation Photos Wound Measurements Length: (cm) 0.4 Width: (cm) 0.4 Depth: (cm) 0.7 Area: (cm) 0.126 Volume: (cm) 0.088 % Reduction in Area: -77.5% % Reduction in Volume: -23.9% Epithelialization: Small (1-33%) Tunneling: Yes Position (o'clock): 7 Maximum Distance: (cm) 3.4 Undermining: No Wound Description Classification: Full Thickness With Exposed Support Structures Wound Margin: Distinct, outline attached Exudate Amount: Large Exudate Type: Serosanguineous Exudate Color: red, brown Foul Odor After Cleansing: No Slough/Fibrino No Wound Bed Granulation Amount: Large (67-100%) Exposed Structure Granulation Quality: Red Fascia Exposed: No Necrotic Amount: None Present (0%) Fat Layer (Subcutaneous Tissue) Exposed: Yes Tendon Exposed: No Muscle Exposed: No Joint Exposed: No Bone Exposed: No Treatment Notes Wound #1 (Breast) Wound Laterality: Right Cleanser Wound Cleanser Discharge Instruction: Cleanse the wound with wound cleanser prior to applying a clean dressing using gauze sponges, not tissue or cotton balls. Peri-Wound Care Topical Primary Dressing Hydrofera Blue Classic Foam Rope Dressing, 9x6 (mm/in) Discharge Instruction: Pack gently into wound bed, leaving tail(wider part) out of wound. Moisten with saline after packing in. Then apply wound vac over top of that. Secondary Dressing ALLEVYN Gentle Border, 3x3 (in/in) Discharge Instruction: In clinic Apply over primary dressing as directed. Woven Gauze Sponge, Non-Sterile 4x4 in Discharge Instruction: In clinic Apply  over primary dressing as directed. Secured With Compression Wrap Compression Stockings Environmental education officer) Signed: 03/18/2022 10:35:58 AM By: Erenest Blank Signed: 03/18/2022 3:58:21 PM By: Rhae Hammock RN Entered By: Erenest Blank on 03/17/2022 14:14:26 -------------------------------------------------------------------------------- Vitals  Details Patient Name: Date of Service: [name], [name] 03/17/2022 1:45 PM Medical Record Number: [id number] Patient Account Number: [id number] Date of Birth/Sex: Treating RN: 1946/01/19 (76 y.o. [name], [name] Primary Care Caliber Landess: [name] Other Clinician: Referring Sehaj Kolden: Treating Tamela Elsayed/Extender: [name] in Treatment: 7 Vital Signs Time Taken: 14:05 Temperature (F): 98.7 Height (in): 64 Pulse (bpm): 66 Weight (lbs): 180 Respiratory Rate (breaths/min): 17 Body Mass Index (BMI): 30.9 Blood Pressure (mmHg): 157/84 Reference Range: 80 - 120 mg / dl Electronic Signature(s) Signed: 03/18/2022 3:58:21 PM By: Rhae Hammock RN Entered By: Rhae Hammock on 03/17/2022 14:05:57

## 2022-03-24 ENCOUNTER — Encounter (HOSPITAL_BASED_OUTPATIENT_CLINIC_OR_DEPARTMENT_OTHER): Payer: Medicare HMO | Admitting: Physician Assistant

## 2022-03-24 DIAGNOSIS — T8131XA Disruption of external operation (surgical) wound, not elsewhere classified, initial encounter: Secondary | ICD-10-CM | POA: Diagnosis not present

## 2022-03-24 NOTE — Progress Notes (Addendum)
ALEXES, MENCHACA ([id number]) Visit Report for 03/24/2022 Chief Complaint Document Details Patient Name: Date of Service: [name], [name] 03/24/2022 2:30 PM Medical Record Number: [id number] Patient Account Number: [id number] Date of Birth/Sex: Treating RN: 14-Sep-1945 (76 y.o. F) Primary Care Provider: [name] Other Clinician: Referring Provider: Treating Provider/Extender: [name] in Treatment: 8 Information Obtained from: Patient Chief Complaint Right breast ulcer Electronic Signature(s) Signed: 03/24/2022 2:32:40 PM By: Worthy Keeler PA-C Entered By: Worthy Keeler on 03/24/2022 14:32:40 -------------------------------------------------------------------------------- Debridement Details Patient Name: Date of Service: [name], [name] 03/24/2022 2:30 PM Medical Record Number: [id number] Patient Account Number: [id number] Date of Birth/Sex: Treating RN: 17-Apr-1946 (76 y.o. [name], [name] Primary Care Provider: [name] Other Clinician: Referring Provider: Treating Provider/Extender: [name] in Treatment: 8 Debridement Performed for Assessment: Wound #1 Right Breast Performed By: Physician Worthy Keeler, PA Debridement Type: Debridement Level of Consciousness (Pre-procedure): Awake and Alert Pre-procedure Verification/Time Out Yes - 14:45 Taken: Start Time: 14:45 Pain Control: Lidocaine T Area Debrided (L x W): otal 0.4 (cm) x 0.4 (cm) = 0.16 (cm) Tissue and other material debrided: Viable, Non-Viable, Slough, Subcutaneous, Slough Level: Skin/Subcutaneous Tissue Debridement Description: Excisional Instrument: Curette Bleeding: Minimum Hemostasis Achieved: Pressure End Time: 14:45 Procedural Pain: 0 Post Procedural Pain: 0 Response to Treatment: Procedure was tolerated well Level of Consciousness (Post- Awake and Alert procedure): Post Debridement Measurements of Total  Wound Length: (cm) 0.4 Width: (cm) 0.4 Depth: (cm) 0.5 Volume: (cm) 0.063 Character of Wound/Ulcer Post Debridement: Improved Post Procedure Diagnosis Same as Pre-procedure Electronic Signature(s) Signed: 03/24/2022 6:03:16 PM By: Worthy Keeler PA-C Signed: 03/26/2022 1:10:23 PM By: Rhae Hammock RN Entered By: Rhae Hammock on 03/24/2022 14:48:36 -------------------------------------------------------------------------------- HPI Details Patient Name: Date of Service: [name] 03/24/2022 2:30 PM Medical Record Number: [id number] Patient Account Number: [id number] Date of Birth/Sex: Treating RN: 1946/06/17 (76 y.o. F) Primary Care Provider: [name] Other Clinician: Referring Provider: Treating Provider/Extender: [name] in Treatment: 8 History of Present Illness HPI Description: 01-27-2022 upon evaluation today patient presents for initial evaluation here in our clinic concerning issues she has been having with her right breast. She had an area of calcium buildup where she had actual staples that were being pushed out that were noted from her previous breast surgery which was roughly 23 years ago when she had cancer. At that point she had also undergone radiation therapy. 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 Nonetheless after her most recent surgery in February to remove the staples and the calcium deposit this has started to build back up due to the damage to the breast tissue. She was seen initially by Dr. Brantley Stage. Eventually he referred her to Dr. Rebekah Chesterfield to see about a mastectomy to take care of the issue. With that being said this was something that was good to be reserved just for worst-case scenario in fact they were a little bit concerned about her reaction considering her blood thinners and the fact that she may not do as well and surgery is what they would like to see with all  the other medical problems that she has. For that reason they referred her to [name] in order to see what we can do from a healing standpoint. 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 02-03-2022 upon evaluation today patient appears to be doing okay in regard to her wound on the breast. Fortunately I do not see any signs of worsening. Unfortunately I do believe that she is still having some discomfort also there was some misunderstanding about how to do the dressing which I think will make it a little bit easier she should be putting it in before waiting it and then after its in and situated she can wet this. I think that is going to be a lot easier for her than what she has been trying to do. 02-10-2022 upon evaluation today patient appears to be doing about the same in regard to her wound. She has been tolerating the dressing changes without complication. We are actually starting her on linezolid today actually had to apply to get this approved by her insurance I am hopeful that this will be affordable for her now it was going to cost her way too much before. With that being said I do believe that she is headed in the right direction which is great news. No fevers, chills, nausea, vomiting, or diarrhea. 7/26; right breast apparently secondary to surgery earlier this year for protruding staples and underlying traumatic calcifications. She has a nonhealing wound with some depth and tunneling. She has been using Hydrofera Blue with some improvement in measurements today. Nevertheless she has a wound VAC and we went ahead and applied this. I am not certain whether she is going to be eligible for home  health but her staff are certainly going to work on it 02-24-2022 upon evaluation today patient appears to be doing excellent she is using the gauze VAC at this point and it seems to be doing a great job for her. Fortunately there does not appear to be any signs of active infection at this time which is excellent. No fever or chills noted 03-10-2022 upon evaluation today patient appears to be doing well with regard to her wound although the wound was definitely over packed compared to what it needs to be. I think they were probably got to switch to [facility] Blue rope which I think would be much easier to put him than the current black foam which is what home health is actually using we will try to do a gauze VAC but to be honest there is not actually doing that. Fortunately there does not appear to be any signs of active infection locally or systemically at this time which is great news. I think the biggest issue I see is simply that we need to make sure and have the patient have the Hydrofera Blue rope which is again I think a much better way to go with this and avoid altogether the use of the black foam inside the wound and put a piece on the outside that is appropriate and fine. 03-17-2022 upon evaluation today patient appears to be doing well currently in regard to her wound. The wound VAC does seem to be helping the [facility] seems to be much better and very pleased in this regard. I do not see any signs of active infection locally or systemically at this time which is great news. No fevers, chills, nausea, vomiting, or diarrhea. 03-24-2022 upon evaluation today patient appears to be doing well with regard to her wound. I am seeing signs  of improvement which is great news. Fortunately she is not doing any worse unfortunately they did not have the supplies and her wound VAC on until yesterday but otherwise she is doing excellent. Electronic Signature(s) Signed: 03/24/2022 2:53:57 PM By: Worthy Keeler PA-C Entered By: Worthy Keeler on 03/24/2022 14:53:57 -------------------------------------------------------------------------------- Physical Exam Details Patient Name: Date of Service: [name], [name] 03/24/2022 2:30 PM Medical Record Number: [id number] Patient Account Number: [id number] Date of Birth/Sex: Treating RN: [birth year] (76 y.o. F) Primary Care Provider: [name] Other Clinician: Referring Provider: Treating Provider/Extender: Renae Fickle Weeks in Treatment: 8 Constitutional Well-nourished and well-hydrated in no acute distress. Respiratory normal breathing without difficulty. Psychiatric this patient is able to make decisions and demonstrates good insight into disease process. Alert and Oriented x 3. pleasant and cooperative. Notes She isUpon inspection patient's wound bed actually showed signs of good granulation and epithelization at this point. Fortunately making good progress in my opinion I did perform debridement to clearway some of the necrotic debris internally and she tolerated that without complication postdebridement wound bed is much better. Electronic Signature(s) Signed: 03/24/2022 2:54:15 PM By: Worthy Keeler PA-C Entered By: Worthy Keeler on 03/24/2022 14:54:15 -------------------------------------------------------------------------------- Physician Orders Details Patient Name: Date of Service: [name], [name] 03/24/2022 2:30 PM Medical Record Number: [id number] Patient Account Number: [id number] Date of Birth/Sex: Treating RN: 19-Jan-1946 (76 y.o. 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 - WEdnesday w/ Jeri Cos and Allayne Butcher Rm # 9 Return A Negative Presssure Wound Therapy Wound Vac to wound continuously at 154m/hg pressure - twice a week dressing changes. Apply the black foam over the hydraferablue. Home Health New wound care orders this week; continue Home Health for wound care. May utilize formulary equivalent dressing for wound treatment orders unless otherwise specified. - **********Home Health to change wound vac dressings twice a week- Mondays and Thursdays. Patient to come to wound center on Wednesdays- a wet to dry dressing will be applied and the following day (Thursday) home health will go back out to apply wound vac. ***Change gauze packing to hydraferablue rope packing*** Other Home Health Orders/Instructions: - [facility] home [facility] Wound Treatment Wound #1 - Breast Wound Laterality: Right Cleanser: Wound Cleanser (Home Health) (Generic) 3 x Per Week/15 Days Discharge Instructions: Cleanse the wound with wound cleanser prior to applying a clean dressing using gauze sponges, not tissue or cotton balls. Prim Dressing: Hydrofera Blue Classic Foam Rope Dressing, 9x6 (mm/in) (Home Health) 3 x Per Week/15 Days ary Discharge Instructions: Pack gently into wound bed, leaving tail(wider part) out of wound. Moisten with saline after packing in. Then apply wound vac over top of that. Secondary Dressing: ALLEVYN Gentle Border, 3x3 (in/in) 3 x Per Week/15 Days Discharge Instructions: In clinic Apply over primary dressing as directed. Secondary Dressing:  Woven Gauze Sponge, Non-Sterile 4x4 in 3 x Per Week/15 Days Discharge Instructions: In clinic Apply over primary dressing as directed. Electronic  Signature(s) Signed: 03/24/2022 6:03:16 PM By: Worthy Keeler PA-C Signed: 03/26/2022 1:10:23 PM By: Rhae Hammock RN Entered By: Rhae Hammock on 03/24/2022 14:49:12 -------------------------------------------------------------------------------- Problem List Details Patient Name: Date of Service: [name], [name] 03/24/2022 2:30 PM Medical Record Number: [id number] Patient Account Number: [id number] Date of Birth/Sex: Treating RN: [birth year] (76 y.o. F) Primary Care Provider: [name] Other Clinician: Referring Provider: Treating Provider/Extender: [name] in Treatment: 8 Active Problems ICD-10 Encounter Code Description Active Date MDM Diagnosis T81.31XA Disruption of external operation (surgical) wound, not elsewhere classified, 01/27/2022 No Yes initial encounter L98.492 Non-pressure chronic ulcer of skin of other sites with fat layer exposed 01/27/2022 No Yes E83.59 Other disorders of calcium metabolism 01/27/2022 No Yes L59.8 Other specified disorders of the skin and subcutaneous tissue related to 01/27/2022 No Yes radiation D68.61 Antiphospholipid syndrome 01/27/2022 No Yes I10 Essential (primary) hypertension 01/27/2022 No Yes I25.10 Atherosclerotic heart disease of native coronary artery without angina pectoris 01/27/2022 No Yes Inactive Problems Resolved Problems Electronic Signature(s) Signed: 03/24/2022 2:32:12 PM By: Worthy Keeler PA-C Entered By: Worthy Keeler on 03/24/2022 14:32:11 -------------------------------------------------------------------------------- Progress Note Details Patient Name: Date of Service: [name], [name] 03/24/2022 2:30 PM Medical Record Number: [id number] Patient Account Number: [id number] Date of Birth/Sex: Treating RN: 01-10-1946 (76 y.o. F) Primary  Care Provider: [name] Other Clinician: Referring Provider: Treating Provider/Extender: [name] in Treatment: 8 Subjective Chief Complaint Information obtained from Patient Right breast ulcer History of Present Illness (HPI) 01-27-2022 upon evaluation today patient presents for initial evaluation here in our clinic concerning issues she has been having with her right breast. She had an area of calcium buildup where she had actual staples that were being pushed out that were noted from her previous breast surgery which was roughly 23 years ago when she had cancer. At that point she had also undergone radiation therapy. 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 Nonetheless after her most recent surgery in February to remove the staples and the calcium deposit this has started to build back up due to the damage to the breast tissue. She was seen initially by Dr. Brantley Stage. Eventually he referred her to Dr. Rebekah Chesterfield to see about a mastectomy to take care of the issue. With that being said this was something that was good to be reserved just for worst-case scenario in fact they were a little bit concerned about her reaction considering her blood thinners and the fact that she may not do as well and surgery is what they would like to see with all the other medical problems that she has. For that reason they referred her to [name] in order to see what we can do from a healing standpoint. 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 02-03-2022 upon evaluation today patient appears to be doing okay in regard to her wound on the breast.  Fortunately I do not see any signs of worsening. Unfortunately I do believe that she is still having some discomfort also there was some misunderstanding about how to do the dressing which I think will make it a little bit easier she should be putting it  in before waiting it and then after its in and situated she can wet this. I think that is going to be a lot easier for her than what she has been trying to do. 02-10-2022 upon evaluation today patient appears to be doing about the same in regard to her wound. She has been tolerating the dressing changes without complication. We are actually starting her on linezolid today actually had to apply to get this approved by her insurance I am hopeful that this will be affordable for her now it was going to cost her way too much before. With that being said I do believe that she is headed in the right direction which is great news. No fevers, chills, nausea, vomiting, or diarrhea. 7/26; right breast apparently secondary to surgery earlier this year for protruding staples and underlying traumatic calcifications. She has a nonhealing wound with some depth and tunneling. She has been using Hydrofera Blue with some improvement in measurements today. Nevertheless she has a wound VAC and we went ahead and applied this. I am not certain whether she is going to be eligible for home health but her staff are certainly going to work on it 02-24-2022 upon evaluation today patient appears to be doing excellent she is using the gauze VAC at this point and it seems to be doing a great job for her. Fortunately there does not appear to be any signs of active infection at this time which is excellent. No fever or chills noted 03-10-2022 upon evaluation today patient appears to be doing well with regard to her wound although the wound was definitely over packed compared to what it needs to be. I think they were probably got to switch to [facility] Blue rope which I think would be  much easier to put him than the current black foam which is what home health is actually using we will try to do a gauze VAC but to be honest there is not actually doing that. Fortunately there does not appear to be any signs of active infection locally or systemically at this time which is great news. I think the biggest issue I see is simply that we need to make sure and have the patient have the Hydrofera Blue rope which is again I think a much better way to go with this and avoid altogether the use of the black foam inside the wound and put a piece on the outside that is appropriate and fine. 03-17-2022 upon evaluation today patient appears to be doing well currently in regard to her wound. The wound VAC does seem to be helping the [facility] seems to be much better and very pleased in this regard. I do not see any signs of active infection locally or systemically at this time which is great news. No fevers, chills, nausea, vomiting, or diarrhea. 03-24-2022 upon evaluation today patient appears to be doing well with regard to her wound. I am seeing signs of improvement which is great news. Fortunately she is not doing any worse unfortunately they did not have the supplies and her wound VAC on until yesterday but otherwise she is doing excellent. Objective Constitutional Well-nourished and well-hydrated in no acute distress. Vitals Time Taken: 2:17 PM, Height: 64 in, Weight: 180 lbs, BMI: 30.9, Temperature: 98.2 F, Pulse: 97 bpm, Respiratory Rate: 20 breaths/min, Blood Pressure: 120/79 mmHg. Respiratory normal breathing without difficulty. Psychiatric this patient is able to make decisions and demonstrates good insight into disease process. Alert and Oriented x 3. pleasant  and cooperative. General Notes: She isUpon inspection patient's wound bed actually showed signs of good granulation and epithelization at this point. Fortunately making good progress in my opinion I did perform  debridement to clearway some of the necrotic debris internally and she tolerated that without complication postdebridement wound bed is much better. Integumentary (Hair, Skin) Wound #1 status is Open. Original cause of wound was Surgical Injury. The date acquired was: 08/26/2021. The wound has been in treatment 8 weeks. The wound is located on the Right Breast. The wound measures 0.4cm length x 0.4cm width x 0.5cm depth; 0.126cm^2 area and 0.063cm^3 volume. There is Fat Layer (Subcutaneous Tissue) exposed. There is no undermining noted, however, there is tunneling at 9:00 with a maximum distance of 2.9cm. There is a large amount of serosanguineous drainage noted. Foul odor after cleansing was noted. Foul odor after cleansing was noted due to product use. The wound margin is distinct with the outline attached to the wound base. There is large (67-100%) red granulation within the wound bed. There is a small (1-33%) amount of necrotic tissue within the wound bed including Adherent Slough. 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 There was a Excisional Skin/Subcutaneous Tissue Debridement with a total area of 0.16 sq cm performed by Worthy Keeler, PA. With the following instrument(s): Curette to remove Viable and Non-Viable tissue/material. Material removed includes Subcutaneous Tissue and Slough and after achieving pain control using Lidocaine. No specimens were taken. A  time out was conducted at 14:45, prior to the start of the procedure. A Minimum amount of bleeding was controlled with Pressure. The procedure was tolerated well with a pain level of 0 throughout and a pain level of 0 following the procedure. Post Debridement Measurements: 0.4cm length x 0.4cm width x 0.5cm depth; 0.063cm^3 volume. Character of Wound/Ulcer Post Debridement is improved. Post procedure Diagnosis Wound #1: Same as Pre-Procedure Plan Follow-up Appointments: Return Appointment in 1 week. - WEdnesday w/ Jeri Cos and [place] Rm # 9 Negative Presssure Wound Therapy: Wound Vac to wound continuously at 135m/hg pressure - twice a week dressing changes. Apply the black foam over the hydraferablue. Home Health: New wound care orders this week; continue Home Health for wound care. May utilize formulary equivalent dressing for wound treatment orders unless otherwise specified. - **********Home Health to change wound vac dressings twice a week- Mondays and Thursdays. Patient to come to wound center on Wednesdays- a wet to dry dressing will be applied and the following day (Thursday) home health will go back out to apply wound vac. ***Change gauze packing to hydraferablue rope packing*** Other Home Health Orders/Instructions: - [facility] home [facility] WOUND #1: - Breast Wound Laterality: Right Cleanser: Wound Cleanser (Home Health) (Generic) 3 x Per Week/15 Days Discharge Instructions: Cleanse the wound with wound cleanser prior to applying a clean dressing using gauze sponges, not tissue or cotton balls. Prim Dressing: Hydrofera Blue Classic Foam Rope Dressing, 9x6 (mm/in) (Home Health) 3 x Per Week/15 Days ary Discharge Instructions: Pack gently into wound bed, leaving tail(wider part) out of wound. Moisten with saline after packing in.  Then apply wound vac over top of that. Secondary Dressing: ALLEVYN Gentle Border, 3x3 (in/in) 3 x Per Week/15 Days Discharge Instructions: In clinic Apply  over primary dressing as directed. Secondary Dressing: Woven Gauze Sponge, Non-Sterile 4x4 in 3 x Per Week/15 Days Discharge Instructions: In clinic Apply over primary dressing as directed. 1. I would recommend that we go ahead and continue with the wound care measures as before and the patient is in agreement with plan. This includes the use of the Hydrofera Blue rope which I think is doing quite well. 2. We will continue with the wound VAC to cover following. With 3 months of them suggest the patient should continue to monitor for any signs of infection. Obviously if anything changes she should contact the office and let me know. We will see patient back for reevaluation in 1 week here in the clinic. If anything worsens or changes patient will contact our office for additional recommendations. Electronic Signature(s) Signed: 03/24/2022 2:54:41 PM By: Worthy Keeler PA-C Entered By: Worthy Keeler on 03/24/2022 14:54:41 -------------------------------------------------------------------------------- SuperBill Details Patient Name: Date of Service: [name], [name] 03/24/2022 Medical Record Number: [id number] Patient Account Number: [id number] Date of Birth/Sex: Treating RN: Dec 14, 1945 (76 y.o. 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 related to radiation D68.61 Antiphospholipid syndrome I10 Essential (primary) hypertension I25.10 Atherosclerotic heart disease of native coronary artery without angina pectoris Facility  Procedures CPT4 Code: 15379432 Description: 76147 - DEB SUBQ TISSUE 20 SQ CM/< ICD-10 Diagnosis Description L98.492 Non-pressure chronic ulcer of skin of other sites with fat layer exposed Modifier: Quantity: 1 Physician Procedures : CPT4 Code Description Modifier [id number] - WC PHYS SUBQ TISS 20 SQ CM ICD-10 Diagnosis Description L98.492 Non-pressure chronic ulcer of skin of other sites with fat layer exposed Quantity: 1 Electronic Signature(s) Signed: 03/24/2022 2:54:55 PM By: Worthy Keeler PA-C Entered By: Worthy Keeler on 03/24/2022 14:54:55

## 2022-03-26 NOTE — Progress Notes (Signed)
[name], [name] ([id number]) Visit Report for 03/24/2022 Arrival Information Details Patient Name: Date of Service: [name], [name] 03/24/2022 2:30 PM Medical Record Number: [id number] Patient Account Number: [id number] Date of Birth/Sex: Treating RN: 1946-04-03 (76 y.o. [name], Tammi Klippel Primary Care Easter Kennebrew: [name] Other Clinician: Referring Carleah Yablonski: Treating Majesti Gambrell/Extender: [name] in Treatment: 8 Visit Information History Since Last Visit Added or deleted any medications: No Patient Arrived: Ambulatory Any new allergies or adverse reactions: No Arrival Time: 14:17 Had a fall or experienced change in No Accompanied By: self activities of daily living that may affect Transfer Assistance: None risk of falls: Patient Identification Verified: Yes Signs or symptoms of abuse/neglect since last visito No Secondary Verification Process Completed: Yes Hospitalized since last visit: No Patient Requires Transmission-Based Precautions: No Implantable device outside of the clinic excluding No Patient Has Alerts: Yes cellular tissue based products placed in the center Patient Alerts: Patient on Blood Thinner since last visit: Has Dressing in Place as Prescribed: Yes Pain Present Now: No Electronic Signature(s) Signed: 03/24/2022 4:57:16 PM By: Deon Pilling RN, BSN Entered By: Deon Pilling on 03/24/2022 14:17:24 -------------------------------------------------------------------------------- Encounter Discharge Information Details Patient Name: Date of Service: [name] 03/24/2022 2:30 PM Medical Record Number: [id number] Patient Account Number: [id number] Date of Birth/Sex: Treating RN: [birth year] (76 y.o. [name], [name] Primary Care Ileen Kahre: [name] Other Clinician: Referring Ilissa Rosner: Treating Jayleen Afonso/Extender: [name] in Treatment: 8 Encounter Discharge Information Items Post  Procedure Vitals Discharge Condition: Stable Temperature (F): 98.7 Ambulatory Status: Ambulatory Pulse (bpm): 74 Discharge Destination: Home Respiratory Rate (breaths/min): 17 Transportation: Private Auto Blood Pressure (mmHg): 134/74 Accompanied By: self Schedule Follow-up Appointment: Yes Clinical Summary of Care: Patient Declined Electronic Signature(s) Signed: 03/26/2022 1:10:23 PM By: Rhae Hammock RN Entered By: Rhae Hammock on 03/24/2022 14:58:22 -------------------------------------------------------------------------------- Lower Extremity Assessment Details Patient Name: Date of Service: [name] 03/24/2022 2:30 PM Medical Record Number: [id number] Patient Account Number: [id number] Date of Birth/Sex: Treating RN: 12-29-1945 (76 y.o. [name] Primary Care Mailey Landstrom: [name] Other Clinician: Referring Kashawn Dirr: Treating Tristy Udovich/Extender: [name] in Treatment: 8 Electronic Signature(s) Signed: 03/24/2022 4:57:16 PM By: Deon Pilling RN, BSN Entered By: Deon Pilling on 03/24/2022 14:17:50 -------------------------------------------------------------------------------- Multi-Disciplinary Care Plan Details Patient Name: Date of Service: JISELLE, SHEU 03/24/2022 2:30 PM Medical Record Number: [id number] Patient Account Number: [id number] Date of Birth/Sex: Treating RN: [birth year] (76 y.o. [name], [name] Primary Care Dreydon Cardenas: [name] Other Clinician: Referring Mana Haberl: Treating Dorine Duffey/Extender: [name] in Treatment: 8 Active Inactive Wound/Skin Impairment Nursing Diagnoses: Impaired tissue integrity Knowledge deficit related to ulceration/compromised skin integrity Goals: Patient will have a decrease in wound volume by X% from date: (specify in notes) Date Initiated: 01/27/2022 Target Resolution Date: 03/27/2022 Goal Status: Active Patient/caregiver  will verbalize understanding of skin care regimen Date Initiated: 01/27/2022 Target Resolution Date: 03/27/2022 Goal Status: Active Ulcer/skin breakdown will have a volume reduction of 30% by week 4 Date Initiated: 01/27/2022 Target Resolution Date: 03/27/2022 Goal Status: Active Interventions: Assess patient/caregiver ability to obtain necessary supplies Assess patient/caregiver ability to perform ulcer/skin care regimen upon admission and as needed Assess ulceration(s) every visit Notes: Electronic Signature(s) Signed: 03/26/2022 1:10:23 PM By: Rhae Hammock RN Entered By: Rhae Hammock on 03/24/2022 14:49:19 -------------------------------------------------------------------------------- Pain Assessment Details Patient Name: Date of Service: [name] 03/24/2022 2:30 PM Medical Record Number: [id number] Patient Account Number: [id number] Date of Birth/Sex: Treating RN: Apr 11, 1946 (75 y.o.  [name] Primary Care Imari Reen: [name] Other Clinician: Referring Melo Stauber: Treating Kenetra Hildenbrand/Extender: [name] in Treatment: 8 Active Problems Location of Pain Severity and Description of Pain Patient Has Paino Yes Site Locations Pain Location: Pain in Ulcers Rate the pain. Current Pain Level: 7 Pain Management and Medication Current Pain Management: Medication: No Cold Application: No Rest: No Massage: No Activity: No T.E.N.S.: No Heat Application: No Leg drop or elevation: No Is the Current Pain Management Adequate: Adequate How does your wound impact your activities of daily livingo Sleep: No Bathing: No Appetite: No Relationship With Others: No Bladder Continence: No Emotions: No Bowel Continence: No Work: No Toileting: No Drive: No Dressing: No Hobbies: No Engineer, maintenance) Signed: 03/24/2022 4:57:16 PM By: Deon Pilling RN, BSN Entered By: Deon Pilling on 03/24/2022  14:17:46 -------------------------------------------------------------------------------- Patient/Caregiver Education Details Patient Name: Date of Service: [name] 8/30/2023andnbsp2:30 PM Medical Record Number: [id number] Patient Account Number: [id number] Date of Birth/Gender: Treating RN: 1945/08/19 (76 y.o. [name], [name] Primary Care Physician: [name] Other Clinician: Referring Physician: Treating Physician/Extender: [name] in Treatment: 8 Education Assessment Education Provided To: Patient Education Topics Provided Wound/Skin Impairment: Methods: Explain/Verbal Responses: Reinforcements needed, State content correctly Electronic Signature(s) Signed: 03/26/2022 1:10:23 PM By: Rhae Hammock RN Entered By: Rhae Hammock on 03/24/2022 14:32:25 -------------------------------------------------------------------------------- Wound Assessment Details Patient Name: Date of Service: [name] 03/24/2022 2:30 PM Medical Record Number: [id number] Patient Account Number: [id number] Date of Birth/Sex: Treating RN: [birth year] (76 y.o. [name], Tammi Klippel Primary Care Mary-Anne Polizzi: [name] Other Clinician: Referring Zemirah Krasinski: Treating Alnita Aybar/Extender: Renae Fickle Weeks in Treatment: 8 Wound Status Wound Number: 1 Primary Open Surgical Wound Etiology: Wound Location: Right Breast Secondary Calciphylaxis Wounding Event: Surgical Injury Etiology: Date Acquired: 08/26/2021 Wound Open Weeks Of Treatment: 8 Status: Clustered Wound: No Comorbid Asthma, Deep Vein Thrombosis, Hypertension, Peripheral Arterial History: Disease, Hepatitis B, Osteoarthritis, Neuropathy, Received Radiation Photos Wound Measurements Length: (cm) 0.4 Width: (cm) 0.4 Depth: (cm) 0.5 Area: (cm) 0.126 Volume: (cm) 0.063 % Reduction in Area: -77.5% % Reduction in Volume: 11.3% Epithelialization: Small  (1-33%) Tunneling: Yes Position (o'clock): 9 Maximum Distance: (cm) 2.9 Undermining: No Wound Description Classification: Full Thickness With Exposed Support Structures Wound Margin: Distinct, outline attached Exudate Amount: Large Exudate Type: Serosanguineous Exudate Color: red, brown Foul Odor After Cleansing: Yes Due to Product Use: Yes Slough/Fibrino Yes Wound Bed Granulation Amount: Large (67-100%) Exposed Structure Granulation Quality: Red Fascia Exposed: No Necrotic Amount: Small (1-33%) Fat Layer (Subcutaneous Tissue) Exposed: Yes Necrotic Quality: Adherent Slough Tendon Exposed: No Muscle Exposed: No Joint Exposed: No Bone Exposed: No Treatment Notes Wound #1 (Breast) Wound Laterality: Right Cleanser Wound Cleanser Discharge Instruction: Cleanse the wound with wound cleanser prior to applying a clean dressing using gauze sponges, not tissue or cotton balls. Peri-Wound Care Topical Primary Dressing Hydrofera Blue Classic Foam Rope Dressing, 9x6 (mm/in) Discharge Instruction: Pack gently into wound bed, leaving tail(wider part) out of wound. Moisten with saline after packing in. Then apply wound vac over top of that. Secondary Dressing ALLEVYN Gentle Border, 3x3 (in/in) Discharge Instruction: In clinic Apply over primary dressing as directed. Woven Gauze Sponge, Non-Sterile 4x4 in Discharge Instruction: In clinic Apply over primary dressing as directed. Secured With Compression Wrap Compression Stockings Environmental education officer) Signed: 03/24/2022 4:57:16 PM By: Deon Pilling RN, BSN Entered By: Deon Pilling on 03/24/2022 14:23:10 -------------------------------------------------------------------------------- Vitals Details Patient Name: Date of Service: [name]. 03/24/2022 2:30 PM  Medical Record Number: [id number] Patient Account Number: [id number] Date of Birth/Sex: Treating RN: Sep 10, 1945 (76 y.o. [name], Tammi Klippel Primary Care  Jeyla Bulger: [name] Other Clinician: Referring Paiton Fosco: Treating Shuan Statzer/Extender: [name] in Treatment: 8 Vital Signs Time Taken: 14:17 Temperature (F): 98.2 Height (in): 64 Pulse (bpm): 97 Weight (lbs): 180 Respiratory Rate (breaths/min): 20 Body Mass Index (BMI): 30.9 Blood Pressure (mmHg): 120/79 Reference Range: 80 - 120 mg / dl Electronic Signature(s) Signed: 03/24/2022 4:57:16 PM By: Deon Pilling RN, BSN Entered By: Deon Pilling on 03/24/2022 14:17:37

## 2022-03-30 ENCOUNTER — Other Ambulatory Visit: Payer: Medicare HMO

## 2022-03-30 ENCOUNTER — Inpatient Hospital Stay: Payer: Medicare HMO

## 2022-03-31 ENCOUNTER — Encounter (HOSPITAL_BASED_OUTPATIENT_CLINIC_OR_DEPARTMENT_OTHER): Payer: Medicare HMO | Attending: Physician Assistant | Admitting: Physician Assistant

## 2022-03-31 DIAGNOSIS — I10 Essential (primary) hypertension: Secondary | ICD-10-CM | POA: Diagnosis not present

## 2022-03-31 DIAGNOSIS — Z7901 Long term (current) use of anticoagulants: Secondary | ICD-10-CM | POA: Insufficient documentation

## 2022-03-31 DIAGNOSIS — T8131XA Disruption of external operation (surgical) wound, not elsewhere classified, initial encounter: Secondary | ICD-10-CM | POA: Insufficient documentation

## 2022-03-31 DIAGNOSIS — I2510 Atherosclerotic heart disease of native coronary artery without angina pectoris: Secondary | ICD-10-CM | POA: Insufficient documentation

## 2022-03-31 DIAGNOSIS — Z853 Personal history of malignant neoplasm of breast: Secondary | ICD-10-CM | POA: Insufficient documentation

## 2022-03-31 DIAGNOSIS — D6861 Antiphospholipid syndrome: Secondary | ICD-10-CM | POA: Diagnosis not present

## 2022-03-31 DIAGNOSIS — Z923 Personal history of irradiation: Secondary | ICD-10-CM | POA: Diagnosis not present

## 2022-03-31 DIAGNOSIS — L598 Other specified disorders of the skin and subcutaneous tissue related to radiation: Secondary | ICD-10-CM | POA: Insufficient documentation

## 2022-03-31 DIAGNOSIS — L98492 Non-pressure chronic ulcer of skin of other sites with fat layer exposed: Secondary | ICD-10-CM | POA: Insufficient documentation

## 2022-03-31 DIAGNOSIS — X58XXXA Exposure to other specified factors, initial encounter: Secondary | ICD-10-CM | POA: Insufficient documentation

## 2022-03-31 NOTE — Progress Notes (Addendum)
BIDDIE, SEBEK ([id number]) [id number] Page 1 of 7 Visit Report for 03/31/2022 Chief Complaint Document Details Patient Name: Date of Service: [name], [name] 03/31/2022 1:45 PM Medical Record Number: [id number] Patient Account Number: [id number] Date of Birth/Sex: Treating RN: [birth year] (76 y.o. F) Primary Care Provider: [name] Other Clinician: Referring Provider: Treating Provider/Extender: [name] in Treatment: 9 Information Obtained from: Patient Chief Complaint Right breast ulcer Electronic Signature(s) Signed: 03/31/2022 2:12:17 PM By: Worthy Keeler PA-C Previous Signature: 03/31/2022 2:11:57 PM Version By: Worthy Keeler PA-C Entered By: Worthy Keeler on 03/31/2022 14:12:17 -------------------------------------------------------------------------------- HPI Details Patient Name: Date of Service: [name] 03/31/2022 1:45 PM Medical Record Number: [id number] Patient Account Number: [id number] Date of Birth/Sex: Treating RN: Feb 13, 1946 (76 y.o. F) Primary Care Provider: [name] Other Clinician: Referring Provider: Treating Provider/Extender: [name] in Treatment: 9 History of Present Illness HPI Description: 01-27-2022 upon evaluation today patient presents for initial evaluation here in our clinic concerning issues she has been having with her right breast. She had an area of calcium buildup where she had actual staples that were being pushed out that were noted from her previous breast surgery which was roughly 23 years ago when she had cancer. At that point she had also undergone radiation therapy. 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 Nonetheless after her most recent surgery in February to remove the staples and the calcium deposit this has started to build back up due to the damage to the breast tissue. She was seen  initially by Dr. Brantley Stage. Eventually he referred her to Dr. Rebekah Chesterfield to see about a mastectomy to take care of the issue. With that being said this was something that was good to be reserved just for worst-case scenario in fact they were a little bit concerned about her reaction considering her blood thinners and the fact that she may not do as well and surgery is what they would like to see with all the other medical problems that she has. For that reason they referred her to [name] in order to see what we can do from a healing standpoint. 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 02-03-2022 upon evaluation today patient appears to be doing okay in regard to her wound on the breast. Fortunately I do not see any signs of worsening. Unfortunately I do believe that she is still having some discomfort also there was some misunderstanding about how to do the dressing which I think will make it a little bit easier she should be putting it in before waiting it and then after its in and situated she can wet this. I think that is going to be a lot easier for her than what she has been trying to do. 02-10-2022 upon evaluation today patient appears to be doing about the same in regard to her wound. She has been tolerating the dressing changes without complication. We are actually starting her on linezolid today actually had to apply to get this approved by her insurance I am hopeful that this will be affordable for her now it was going to cost her way too much  before. With that being said I do believe that she is headed in the right direction which is great news. No fevers, chills, nausea, vomiting, or  diarrhea. 7/26; right breast apparently secondary to surgery earlier this year for protruding staples and underlying traumatic calcifications. She has a nonhealing wound with some depth and tunneling. She has been using Hydrofera Blue with some improvement in measurements today. Nevertheless she has a wound VAC and we went ahead and applied this. I am not certain whether she is going to be eligible for home health but her staff are certainly going to work on it 02-24-2022 upon evaluation today patient appears to be doing excellent she is using the gauze VAC at this point and it seems to be doing a great job for her. Fortunately there does not appear to be any signs of active infection at this time which is excellent. No fever or chills noted [name], [name] ([id number]) [id number] Page 2 of 7 03-10-2022 upon evaluation today patient appears to be doing well with regard to her wound although the wound was definitely over packed compared to what it needs to be. I think they were probably got to switch to [facility] Blue rope which I think would be much easier to put him than the current black foam which is what home health is actually using we will try to do a gauze VAC but to be honest there is not actually doing that. Fortunately there does not appear to be any signs of active infection locally or systemically at this time which is great news. I think the biggest issue I see is simply that we need to make sure and have the patient have the Hydrofera Blue rope which is again I think a much better way to go with this and avoid altogether the use of the black foam inside the wound and put a piece on the outside that is appropriate and fine. 03-17-2022 upon evaluation today patient appears to be doing well currently in regard to her wound. The wound VAC does seem to be helping the [facility] seems to be much better and very pleased in this regard. I do not see any signs of  active infection locally or systemically at this time which is great news. No fevers, chills, nausea, vomiting, or diarrhea. 03-24-2022 upon evaluation today patient appears to be doing well with regard to her wound. I am seeing signs of improvement which is great news. Fortunately she is not doing any worse unfortunately they did not have the supplies and her wound VAC on until yesterday but otherwise she is doing excellent. 03-31-2022 upon evaluation today patient appears to be doing well currently in regard to her wound. I do feel like the wound VAC is doing well we may be getting close to the point of not utilizing the wound VAC any longer the patient states that she would be happy to get to that point. Fortunately I do not see any evidence of active infection locally or systemically which is great news. Electronic Signature(s) Signed: 03/31/2022 5:48:30 PM By: Worthy Keeler PA-C Entered By: Worthy Keeler on 03/31/2022 17:48:29 -------------------------------------------------------------------------------- Physical Exam Details Patient Name: Date of Service: [name], [name] 03/31/2022 1:45 PM Medical Record Number: [id number] Patient Account Number: [id number] Date of Birth/Sex: Treating RN: [birth year] (76 y.o. F) Primary Care Provider: [name] Other Clinician: Referring Provider: Treating Provider/Extender: Renae Fickle Weeks in Treatment: 9 Constitutional Well-nourished and well-hydrated in no acute distress. Respiratory  normal breathing without difficulty. Psychiatric this patient is able to make decisions and demonstrates good insight into disease process. Alert and Oriented x 3. pleasant and cooperative. Notes Patient's wound bed actually showed signs of excellent granulation and epithelization at this point. In fact it was very clean no sharp debridement was necessary and overall I think were very close to switching away from the wound  VAC. Electronic Signature(s) Signed: 03/31/2022 5:48:58 PM By: Worthy Keeler PA-C Entered By: Worthy Keeler on 03/31/2022 17:48:58 -------------------------------------------------------------------------------- Physician Orders Details Patient Name: Date of Service: [name], [name] 03/31/2022 1:45 PM Medical Record Number: [id number] Patient Account Number: [id number] Date of Birth/Sex: Treating RN: [birth year] (76 y.o. [name], [name] Primary Care Provider: [name] Other Clinician: Referring Provider: Treating Provider/Extender: [name] in Treatment: 9 Verbal / Phone Orders: No Diagnosis Coding [name], [name] ([id number]) [id number] Page 3 of 7 ICD-10 Coding Code Description T81.31XA Disruption of external operation (surgical) wound, not elsewhere classified, initial encounter L98.492 Non-pressure chronic ulcer of skin of other sites with fat layer exposed E83.59 Other disorders of calcium metabolism L59.8 Other specified disorders of the skin and subcutaneous tissue related to radiation D68.61 Antiphospholipid syndrome I10 Essential (primary) hypertension I25.10 Atherosclerotic heart disease of native coronary artery without angina pectoris Follow-up Appointments ppointment in 1 week. - WEdnesday w/ Jeri Cos and Allayne Butcher Rm # 9 Return A Negative Presssure Wound Therapy Wound Vac to wound continuously at 148m/hg pressure - twice a week dressing changes. Apply the black foam over the hydraferablue. Home Health New wound care orders this week; continue Home Health for wound care. May utilize formulary equivalent dressing for wound treatment orders unless otherwise specified. - **********Home Health to change wound vac dressings twice a week- Mondays and Thursdays. Patient to come to wound center on Wednesdays- a wet to dry dressing will be applied and the following day (Thursday) home health will go back  out to apply wound vac. ***Change gauze packing to hydraferablue rope packing*** Other Home Health Orders/Instructions: - [facility] Wound Treatment Wound #1 - Breast Wound Laterality: Right Cleanser: Wound Cleanser (Home Health) (Generic) 3 x Per Week/15 Days Discharge Instructions: Cleanse the wound with wound cleanser prior to applying a clean dressing using gauze sponges, not tissue or cotton balls. Prim Dressing: Hydrofera Blue Classic Foam Rope Dressing, 9x6 (mm/in) (Home Health) 3 x Per Week/15 Days ary Discharge Instructions: Pack gently into wound bed, leaving tail(wider part) out of wound. Moisten with saline after packing in. Then apply wound vac over top of that. Secondary Dressing: ALLEVYN Gentle Border, 3x3 (in/in) 3 x Per Week/15 Days Discharge Instructions: In clinic Apply over primary dressing as directed. Secondary Dressing: Woven Gauze Sponge, Non-Sterile 4x4 in 3 x Per Week/15 Days Discharge Instructions: In clinic Apply over primary dressing as directed. Electronic Signature(s) Signed: 03/31/2022 7:10:08 PM By: SWorthy KeelerPA-C Signed: 07/28/2022 5:37:22 PM By: BRhae HammockRN Entered By: BRhae Hammockon 03/31/2022 14:23:27 -------------------------------------------------------------------------------- Problem List Details Patient Name: Date of Service: [name], MARSEE9/12/2021 1:45 PM Medical Record Number: [id number] Account Number: [id number] of Birth/Sex: Treating RN: 105-Oct-1947(76y.o. 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 01/27/2022 No Yes E83.59 Other disorders of calcium metabolism 01/27/2022 No Yes L59.8 Other specified disorders of the skin and subcutaneous tissue related to 01/27/2022 No Yes radiation D68.61 Antiphospholipid syndrome 01/27/2022 No Yes I10 Essential (primary) hypertension 01/27/2022 No Yes I25.10 Atherosclerotic heart disease of native coronary artery without angina pectoris 01/27/2022 No Yes Inactive Problems Resolved Problems Electronic Signature(s) Signed: 03/31/2022 2:11:48 PM By: Worthy Keeler PA-C Entered By: Worthy Keeler on 03/31/2022 14:11:48 -------------------------------------------------------------------------------- Progress Note Details Patient Name: Date of Service: [name] 03/31/2022 1:45 PM Medical Record Number: [id number] Patient Account Number: [id number] Date of Birth/Sex: Treating RN: 10/21/1945 (76 y.o. F) Primary Care Provider: [name] Other Clinician: Referring Provider: Treating Provider/Extender: [name] in Treatment: 9 Subjective Chief Complaint Information obtained from Patient Right breast ulcer History of Present Illness (HPI) 01-27-2022 upon evaluation today patient presents for initial evaluation here in our clinic concerning issues she has been having with her right breast. She had an area of calcium buildup where she had actual staples that were being pushed out that were noted from her previous breast surgery which was roughly 23 years ago when she had cancer. At that point she had also undergone radiation therapy. 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 Nonetheless after her most recent surgery in February to remove the staples and the calcium deposit this has started to build back up due to the damage to the breast tissue. She was seen initially by Dr. Brantley Stage. Eventually he referred  her to Dr. Rebekah Chesterfield to see about a mastectomy to take care of the issue. With that being said this was something that was good to be reserved just for worst-case scenario in fact they were a little bit concerned about her reaction considering her blood thinners and the fact that she may not do as well and surgery is what they would like to see with all the other medical problems that she has. For that reason they referred her to [name] in order to see what we can do from a healing standpoint. 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 02-03-2022 upon evaluation today patient appears to be doing okay in regard to her wound on the breast. Fortunately I do not see any signs of worsening. Unfortunately I do believe that she is still having some discomfort also there was some misunderstanding about how to do the dressing which I think will make it a little bit easier she should be putting it in before waiting it and then after its in and situated she can wet this. I think that is going to be a lot easier for her than what she has been trying to do. 02-10-2022 upon evaluation today patient appears to be doing about the same in regard to her wound. She has been tolerating the dressing changes without complication. We are actually starting her on linezolid today actually had to apply to get this approved by her insurance I am hopeful that this will be affordable [name], [name] ([id number]) [id number] Page 5 of 7 for her now it was going to cost  her way too much before. With that being said I do believe that she is headed in the right direction which is great news. No fevers,  chills, nausea, vomiting, or diarrhea. 7/26; right breast apparently secondary to surgery earlier this year for protruding staples and underlying traumatic calcifications. She has a nonhealing wound with some depth and tunneling. She has been using Hydrofera Blue with some improvement in measurements today. Nevertheless she has a wound VAC and we went ahead and applied this. I am not certain whether she is going to be eligible for home health but her staff are certainly going to work on it 02-24-2022 upon evaluation today patient appears to be doing excellent she is using the gauze VAC at this point and it seems to be doing a great job for her. Fortunately there does not appear to be any signs of active infection at this time which is excellent. No fever or chills noted 03-10-2022 upon evaluation today patient appears to be doing well with regard to her wound although the wound was definitely over packed compared to what it needs to be. I think they were probably got to switch to [facility] Blue rope which I think would be much easier to put him than the current black foam which is what home health is actually using we will try to do a gauze VAC but to be honest there is not actually doing that. Fortunately there does not appear to be any signs of active infection locally or systemically at this time which is great news. I think the biggest issue I see is simply that we need to make sure and have the patient have the Hydrofera Blue rope which is again I think a much better way to go with this and avoid altogether the use of the black foam inside the wound and put a piece on the outside that is appropriate and fine. 03-17-2022 upon evaluation today patient appears to be doing well currently in regard to her wound. The wound VAC does seem to be helping the [facility] seems to be much better and very pleased in this regard. I do not see any signs of active infection locally or systemically at this time  which is great news. No fevers, chills, nausea, vomiting, or diarrhea. 03-24-2022 upon evaluation today patient appears to be doing well with regard to her wound. I am seeing signs of improvement which is great news. Fortunately she is not doing any worse unfortunately they did not have the supplies and her wound VAC on until yesterday but otherwise she is doing excellent. 03-31-2022 upon evaluation today patient appears to be doing well currently in regard to her wound. I do feel like the wound VAC is doing well we may be getting close to the point of not utilizing the wound VAC any longer the patient states that she would be happy to get to that point. Fortunately I do not see any evidence of active infection locally or systemically which is great news. Objective Constitutional Well-nourished and well-hydrated in no acute distress. Vitals Time Taken: 2:03 PM, Height: 64 in, Weight: 180 lbs, BMI: 30.9, Temperature: 98.1 F. Respiratory normal breathing without difficulty. Psychiatric this patient is able to make decisions and demonstrates good insight into disease process. Alert and Oriented x 3. pleasant and cooperative. General Notes: Patient's wound bed actually showed signs of excellent granulation and epithelization at this point. In fact it was very clean no sharp debridement was necessary and overall I think were  very close to switching away from the wound VAC. Integumentary (Hair, Skin) Wound #1 status is Open. Original cause of wound was Surgical Injury. The date acquired was: 08/26/2021. The wound has been in treatment 9 weeks. The wound is located on the Right Breast. The wound measures 0.3cm length x 0.5cm width x 1cm depth; 0.118cm^2 area and 0.118cm^3 volume. There is Fat Layer (Subcutaneous Tissue) exposed. There is no undermining noted, however, there is tunneling at 7:00 with a maximum distance of 3.4cm. There is a large amount of serosanguineous drainage noted. Foul odor after  cleansing was noted. Foul odor after cleansing was noted due to product use. The wound margin is distinct with the outline attached to the wound base. There is large (67-100%) red granulation within the wound bed. There is a small (1-33%) amount of necrotic tissue within the wound bed including Adherent Slough. 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 - WEdnesday w/ Jeri Cos and [place] Rm # 9 Negative Presssure Wound Therapy: [name], [name] ([id number]) [id number] Page 6 of 7 Wound Vac to wound continuously at 171m/hg pressure - twice a week dressing changes. Apply the black foam over the hydraferablue. Home Health: New wound care orders this week; continue Home Health for wound care. May utilize formulary equivalent dressing for wound treatment orders unless otherwise specified. - **********Home Health to change wound vac dressings twice a week- Mondays and Thursdays. Patient to come to wound center on Wednesdays- a wet to dry dressing will be applied and the following day (Thursday) home health will go back out to apply wound vac. ***Change gauze packing to hydraferablue rope packing*** Other Home Health Orders/Instructions: - [facility] home [facility] WOUND #1: - Breast Wound Laterality: Right Cleanser: Wound Cleanser (Home Health) (Generic) 3 x Per Week/15 Days Discharge Instructions: Cleanse the wound with wound cleanser prior to applying a clean dressing using gauze sponges, not tissue  or cotton balls. Prim Dressing: Hydrofera Blue Classic Foam Rope Dressing, 9x6 (mm/in) (Home Health) 3 x Per Week/15 Days ary Discharge Instructions: Pack gently into wound bed, leaving tail(wider part) out of wound. Moisten with saline after packing in. Then apply wound vac over top of that. Secondary Dressing: ALLEVYN Gentle Border, 3x3 (in/in) 3 x Per Week/15 Days Discharge Instructions: In clinic Apply over primary dressing as directed. Secondary Dressing: Woven Gauze Sponge, Non-Sterile 4x4 in 3 x Per Week/15 Days Discharge Instructions: In clinic Apply over primary dressing as directed. 1. I am going to recommend that we go ahead for the time being and continue with the wound VAC for at least 1 more week. Again depending on how things are progressing we may switch out this for another treatment option. 2. I am also can recommend that we have the patient continue to monitor for any signs of worsening or infection. Obviously if anything changes she knows to contact the office and let me know but my hope right now is that this will continue to show signs of good improvement as we proceed. We will see patient back for reevaluation in 1 week here in the clinic. If anything worsens or changes patient will contact our office for additional  recommendations. Electronic Signature(s) Signed: 03/31/2022 5:49:36 PM By: Worthy Keeler PA-C Entered By: Worthy Keeler on 03/31/2022 17:49:36 -------------------------------------------------------------------------------- SuperBill Details Patient Name: Date of Service: [name] 03/31/2022 Medical Record Number: [id number] Patient Account Number: [id number] Date of Birth/Sex: Treating RN: Jan 24, 1946 (76 y.o. [name], [name] Primary Care Provider: [name] Other Clinician: Referring Provider: Treating Provider/Extender: [name] in Treatment: 9 Diagnosis Coding ICD-10 Codes Code Description T81.31XA  Disruption of external operation (surgical) wound, not elsewhere classified, initial encounter L98.492 Non-pressure chronic ulcer of skin of other sites with fat layer exposed E83.59 Other disorders of calcium metabolism L59.8 Other specified disorders of the skin and subcutaneous tissue related to radiation D68.61 Antiphospholipid syndrome I10 Essential (primary) hypertension I25.10 Atherosclerotic heart disease of native coronary artery without angina pectoris Facility Procedures : CPT4 Code: [id number] Description: 99213 - WOUND CARE VISIT-LEV 3 EST PT Modifier: Quantity: 1 Physician Procedures : CPT4 Code Description Modifier [id number] - WC PHYS LEVEL 4 - EST PT ICD-10 Diagnosis Description T81.31XA Disruption of external operation (surgical) wound, not elsewhere classified, initial encounter L98.492 Non-pressure chronic ulcer of skin of  other sites with fat layer exposed E83.59 Other disorders of calcium metabolism L59.8 Other specified disorders of the skin and subcutaneous tissue related to radiation [name], SHILLER [name] ([id number]) [id number] Page 7 of Quantity: 1 7 Electronic Signature(s) Signed: 03/31/2022 5:50:19 PM By: Worthy Keeler PA-C Entered By: Worthy Keeler on 03/31/2022 17:50:19

## 2022-04-07 ENCOUNTER — Encounter (HOSPITAL_BASED_OUTPATIENT_CLINIC_OR_DEPARTMENT_OTHER): Payer: Medicare HMO | Admitting: Physician Assistant

## 2022-04-07 DIAGNOSIS — L98492 Non-pressure chronic ulcer of skin of other sites with fat layer exposed: Secondary | ICD-10-CM | POA: Diagnosis not present

## 2022-04-07 NOTE — Progress Notes (Addendum)
WAVA, KILDOW ([id number]) Visit Report for 04/07/2022 Chief Complaint Document Details Patient Name: Date of Service: [name], [name] 04/07/2022 11:30 A M Medical Record Number: [id number] Patient Account Number: [id number] Date of Birth/Sex: Treating RN: 1946/06/10 (76 y.o. F) Primary Care Provider: [name] Other Clinician: Referring Provider: Treating Provider/Extender: [name] in Treatment: 10 Information Obtained from: Patient Chief Complaint Right breast ulcer Electronic Signature(s) Signed: 04/07/2022 11:35:25 AM By: Worthy Keeler PA-C Entered By: Worthy Keeler on 04/07/2022 11:35:25 -------------------------------------------------------------------------------- HPI Details Patient Name: Date of Service: [name], [name] 04/07/2022 11:30 A M Medical Record Number: [id number] Patient Account Number: [id number] Date of Birth/Sex: Treating RN: [birth year] (76 y.o. F) Primary Care Provider: [name] Other Clinician: Referring Provider: Treating Provider/Extender: [name] in Treatment: 10 History of Present Illness HPI Description: 01-27-2022 upon evaluation today patient presents for initial evaluation here in our clinic concerning issues she has been having with her right breast. She had an area of calcium buildup where she had actual staples that were being pushed out that were noted from her previous breast surgery which was roughly 23 years ago when she had cancer. At that point she had also undergone radiation therapy. 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 Nonetheless after her most recent surgery in February to remove the staples and the calcium deposit this has started to build back up due to the damage to the breast tissue. She was seen initially by Dr. Brantley Stage. Eventually he referred her to Dr. Rebekah Chesterfield to see about a mastectomy to take care of  the issue. With that being said this was something that was good to be reserved just for worst-case scenario in fact they were a little bit concerned about her reaction considering her blood thinners and the fact that she may not do as well and surgery is what they would like to see with all the other medical problems that she has. For that reason they referred her to [name] in order to see what we can do from a healing standpoint. 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 02-03-2022 upon evaluation today patient appears to be doing okay in regard to her wound on the breast. Fortunately I do not see any signs of worsening. Unfortunately I do believe that she is still having some discomfort also there was some misunderstanding about how to do the dressing which I think will make it a little bit easier she should be putting it in before waiting it and then after its in and situated she can wet this. I think that is going to be a lot easier for her than what she has been trying to do. 02-10-2022 upon evaluation today patient appears to be doing about the same in regard to her wound. She has been tolerating the dressing changes without complication. We are actually starting her on linezolid today actually had to apply to get this approved by her insurance I am hopeful that this will be affordable for her now it was going to cost her way too much before. With that being said I do believe that she is headed in the  right direction which is great news. No fevers, chills, nausea, vomiting, or diarrhea. 7/26; right breast apparently secondary to surgery earlier this year for protruding staples and underlying  traumatic calcifications. She has a nonhealing wound with some depth and tunneling. She has been using Hydrofera Blue with some improvement in measurements today. Nevertheless she has a wound VAC and we went ahead and applied this. I am not certain whether she is going to be eligible for home health but her staff are certainly going to work on it 02-24-2022 upon evaluation today patient appears to be doing excellent she is using the gauze VAC at this point and it seems to be doing a great job for her. Fortunately there does not appear to be any signs of active infection at this time which is excellent. No fever or chills noted 03-10-2022 upon evaluation today patient appears to be doing well with regard to her wound although the wound was definitely over packed compared to what it needs to be. I think they were probably got to switch to [facility] Blue rope which I think would be much easier to put him than the current black foam which is what home health is actually using we will try to do a gauze VAC but to be honest there is not actually doing that. Fortunately there does not appear to be any signs of active infection locally or systemically at this time which is great news. I think the biggest issue I see is simply that we need to make sure and have the patient have the Hydrofera Blue rope which is again I think a much better way to go with this and avoid altogether the use of the black foam inside the wound and put a piece on the outside that is appropriate and fine. 03-17-2022 upon evaluation today patient appears to be doing well currently in regard to her wound. The wound VAC does seem to be helping the [facility] seems to be much better and very pleased in this regard. I do not see any signs of active infection locally or systemically at this time which is great news. No fevers, chills, nausea, vomiting, or diarrhea. 03-24-2022 upon evaluation today patient appears to be doing well with regard  to her wound. I am seeing signs of improvement which is great news. Fortunately she is not doing any worse unfortunately they did not have the supplies and her wound VAC on until yesterday but otherwise she is doing excellent. 03-31-2022 upon evaluation today patient appears to be doing well currently in regard to her wound. I do feel like the wound VAC is doing well we may be getting close to the point of not utilizing the wound VAC any longer the patient states that she would be happy to get to that point. Fortunately I do not see any evidence of active infection locally or systemically which is great news. 04-07-2022 upon evaluation today patient appears to be doing well currently in regard to the wound on her right breast area. Fortunately there does not appear to be any signs of infection at this time which is great news and overall I am extremely pleased with where we stand today. No fevers, chills, nausea, vomiting, or diarrhea. Electronic Signature(s) Signed: 04/07/2022 1:52:02 PM By: Worthy Keeler PA-C Entered By: Worthy Keeler on 04/07/2022 13:52:02 -------------------------------------------------------------------------------- Physical Exam Details Patient Name: Date of Service: TAQUITA, DEMBY 04/07/2022 11:30 A M Medical Record Number: [id number] Patient Account Number: [id number] Date of Birth/Sex:  Treating RN: [birth year] (76 y.o. F) Primary Care Provider: [name] Other Clinician: Referring Provider: Treating Provider/Extender: Renae Fickle Weeks in Treatment: 77 Constitutional Well-nourished and well-hydrated in no acute distress. Respiratory normal breathing without difficulty. Psychiatric this patient is able to make decisions and demonstrates good insight into disease process. Alert and Oriented x 3. pleasant and cooperative. Notes Upon inspection patient's wound bed showed signs of good granulation and epithelization at this point. I am  actually very pleased with where we stand and I think that the patient is doing a good job here as far as keeping the wound VAC in place to I think at this point the wound VAC may no longer really be necessary. It is done its job and this has filled down to just a very tiny tunnel and I think at this point the [facility] alone can allow this to heal and slowly. Electronic Signature(s) Signed: 04/07/2022 1:52:35 PM By: Worthy Keeler PA-C Entered By: Worthy Keeler on 04/07/2022 13:52:34 -------------------------------------------------------------------------------- Physician Orders Details Patient Name: Date of Service: BRIDNEY, GUADARRAMA 04/07/2022 11:30 A M Medical Record Number: [id number] Patient Account Number: [id number] Date of Birth/Sex: Treating RN: 18-Nov-1945 (76 y.o. [name], [name] Primary Care Provider: [name] Other Clinician: Referring Provider: Treating Provider/Extender: [name] in Treatment: 10 Verbal / Phone Orders: No Diagnosis Coding ICD-10 Coding Code Description T81.31XA Disruption of external operation (surgical) wound, not elsewhere classified, initial encounter L98.492 Non-pressure chronic ulcer of skin of other sites with fat layer exposed E83.59 Other disorders of calcium metabolism L59.8 Other specified disorders of the skin and subcutaneous tissue related to radiation D68.61 Antiphospholipid syndrome I10 Essential (primary) hypertension I25.10 Atherosclerotic heart disease of native coronary artery without angina pectoris Follow-up Appointments ppointment in 2 weeks. - w/ Jeri Cos and Elias Else # 9 Return A Bathing/ Shower/ Hygiene May shower with protection but do not get wound dressing(s) wet. - May take dressing off and wash with antibacterial soap and water Negative Presssure Wound Therapy Discontinue wound [place] home health for wound care. Wound Treatment Wound #1 - Breast  Wound Laterality: Right Cleanser: Wound Cleanser (DME) (Generic) 3 x Per Week/15 Days Discharge Instructions: Cleanse the wound with wound cleanser prior to applying a clean dressing using gauze sponges, not tissue or cotton balls. Peri-Wound Care: Skin Prep (DME) (Generic) 3 x Per Week/15 Days Discharge Instructions: Use skin prep as directed Prim Dressing: Hydrofera Blue Classic Foam Rope Dressing, 9x6 (mm/in) 3 x Per Week/15 Days ary Discharge Instructions: Pack gently into wound bed, leaving tail(wider part) out of wound. Moisten with saline after packing in Secondary Dressing: Woven Gauze Sponge, Non-Sterile 4x4 in (DME) (Generic) 3 x Per Week/15 Days Discharge Instructions: In clinic Apply over primary dressing as directed. Secondary Dressing: Zetuvit Plus Silicone Border Dressing 4x4 (in/in) (DME) (Generic) 3 x Per Week/15 Days Discharge Instructions: Apply silicone border over primary dressing as directed. Electronic Signature(s) Signed: 04/07/2022 5:46:43 PM By: Worthy Keeler PA-C Signed: 04/08/2022 4:08:15 PM By: Rhae Hammock RN Entered By: Rhae Hammock on 04/07/2022 11:58:25 -------------------------------------------------------------------------------- Problem List Details Patient Name: Date of Service: [name]. 04/07/2022 11:30 A M Medical Record Number: [id number] Patient Account Number: [id number] Date of Birth/Sex: Treating RN: 07-02-1946 (76 y.o. F) Primary Care Provider: [name] Other Clinician: Referring Provider: Treating Provider/Extender: [name] in Treatment: 10 Active Problems ICD-10 Encounter Code Description Active Date MDM Diagnosis T81.31XA Disruption of  external operation (surgical) wound, not elsewhere classified, 01/27/2022 No Yes initial encounter L98.492 Non-pressure chronic ulcer of skin of other sites with fat layer exposed 01/27/2022 No Yes E83.59 Other disorders of calcium metabolism  01/27/2022 No Yes L59.8 Other specified disorders of the skin and subcutaneous tissue related to 01/27/2022 No Yes radiation D68.61 Antiphospholipid syndrome 01/27/2022 No Yes I10 Essential (primary) hypertension 01/27/2022 No Yes I25.10 Atherosclerotic heart disease of native coronary artery without angina pectoris 01/27/2022 No Yes Inactive Problems Resolved Problems Electronic Signature(s) Signed: 04/07/2022 11:35:18 AM By: Worthy Keeler PA-C Entered By: Worthy Keeler on 04/07/2022 11:35:18 -------------------------------------------------------------------------------- Progress Note Details Patient Name: Date of Service: [name]. 04/07/2022 11:30 A M Medical Record Number: [id number] Patient Account Number: [id number] Date of Birth/Sex: Treating RN: November 06, 1945 (76 y.o. F) Primary Care Provider: [name] Other Clinician: Referring Provider: Treating Provider/Extender: [name] in Treatment: 10 Subjective Chief Complaint Information obtained from Patient Right breast ulcer History of Present Illness (HPI) 01-27-2022 upon evaluation today patient presents for initial evaluation here in our clinic concerning issues she has been having with her right breast. She had an area of calcium buildup where she had actual staples that were being pushed out that were noted from her previous breast surgery which was roughly 23 years ago when she had cancer. At that point she had also undergone radiation therapy. 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 Nonetheless after her most recent surgery in February to remove the staples and the calcium deposit this has started to build back up due to the damage to the breast tissue. She was seen initially by Dr. Brantley Stage. Eventually he referred her to Dr. Rebekah Chesterfield to see about a mastectomy to take care of the issue. With that being said this was something that was good to be  reserved just for worst-case scenario in fact they were a little bit concerned about her reaction considering her blood thinners and the fact that she may not do as well and surgery is what they would like to see with all the other medical problems that she has. For that reason they referred her to [name] in order to see what we can do from a healing standpoint. 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 02-03-2022 upon evaluation today patient appears to be doing okay in regard to her wound on the breast. Fortunately I do not see any signs of worsening. Unfortunately I do believe that she is still having some discomfort also there was some misunderstanding about how to do the dressing which I think will make it a little bit easier she should be putting it in before waiting it and then after its in and situated she can wet this. I think that is going to be a lot easier for her than what she has been trying to do. 02-10-2022 upon evaluation today patient appears to be doing about the same in regard to her wound. She has been tolerating the dressing changes without complication. We are actually starting her on linezolid today actually had to apply to get this approved by her insurance I am hopeful that this will be affordable for  her now it was going to cost her way too much before. With that being said I do believe that she is headed in the right direction which is great news. No fevers, chills, nausea, vomiting, or diarrhea. 7/26; right breast apparently secondary to surgery earlier this year for protruding staples and underlying traumatic calcifications. She has a nonhealing wound with some depth and  tunneling. She has been using Hydrofera Blue with some improvement in measurements today. Nevertheless she has a wound VAC and we went ahead and applied this. I am not certain whether she is going to be eligible for home health but her staff are certainly going to work on it 02-24-2022 upon evaluation today patient appears to be doing excellent she is using the gauze VAC at this point and it seems to be doing a great job for her. Fortunately there does not appear to be any signs of active infection at this time which is excellent. No fever or chills noted 03-10-2022 upon evaluation today patient appears to be doing well with regard to her wound although the wound was definitely over packed compared to what it needs to be. I think they were probably got to switch to [facility] Blue rope which I think would be much easier to put him than the current black foam which is what home health is actually using we will try to do a gauze VAC but to be honest there is not actually doing that. Fortunately there does not appear to be any signs of active infection locally or systemically at this time which is great news. I think the biggest issue I see is simply that we need to make sure and have the patient have the Hydrofera Blue rope which is again I think a much better way to go with this and avoid altogether the use of the black foam inside the wound and put a piece on the outside that is appropriate and fine. 03-17-2022 upon evaluation today patient appears to be doing well currently in regard to her wound. The wound VAC does seem to be helping the [facility] seems to be much better and very pleased in this regard. I do not see any signs of active infection locally or systemically at this time which is great news. No fevers, chills, nausea, vomiting, or diarrhea. 03-24-2022 upon evaluation today patient appears to be doing well with regard to her wound. I am seeing signs of improvement which is great news.  Fortunately she is not doing any worse unfortunately they did not have the supplies and her wound VAC on until yesterday but otherwise she is doing excellent. 03-31-2022 upon evaluation today patient appears to be doing well currently in regard to her wound. I do feel like the wound VAC is doing well we may be getting close to the point of not utilizing the wound VAC any longer the patient states that she would be happy to get to that point. Fortunately I do not see any evidence of active infection locally or systemically which is great news. 04-07-2022 upon evaluation today patient appears to be doing well currently in regard to the wound on her right breast area. Fortunately there does not appear to be any signs of infection at this time which is great news and overall I am extremely pleased with where we stand today. No fevers, chills, nausea, vomiting, or diarrhea. Objective Constitutional Well-nourished and well-hydrated in no acute distress. Vitals Time Taken: 11:28 AM, Height: 64 in, Weight: 180 lbs, BMI:  30.9, Temperature: 97.6 F, Pulse: 76 bpm, Respiratory Rate: 17 breaths/min, Blood Pressure: 160/92 mmHg. Respiratory normal breathing without difficulty. Psychiatric this patient is able to make decisions and demonstrates good insight into disease process. Alert and Oriented x 3. pleasant and cooperative. General Notes: Upon inspection patient's wound bed showed signs of good granulation and epithelization at this point. I am actually very pleased with where we stand and I think that the patient is doing a good job here as far as keeping the wound VAC in place to I think at this point the wound VAC may no longer really be necessary. It is done its job and this has filled down to just a very tiny tunnel and I think at this point the [facility] alone can allow this to heal and slowly. Integumentary (Hair, Skin) Wound #1 status is Open. Original cause of wound was Surgical Injury. The  date acquired was: 08/26/2021. The wound has been in treatment 10 weeks. The wound is located on the Right Breast. The wound measures 0.4cm length x 0.4cm width x 1cm depth; 0.126cm^2 area and 0.126cm^3 volume. There is Fat Layer (Subcutaneous Tissue) exposed. There is tunneling at 7:00 with a maximum distance of 2.5cm. There is a large amount of serosanguineous drainage noted. Foul odor after cleansing was noted. Foul odor after cleansing was noted due to product use. The wound margin is distinct with the outline attached to the wound base. There is large (67-100%) red granulation within the wound bed. There is a small (1-33%) amount of necrotic tissue within the wound bed including Adherent Slough. 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 - w/ Jeri Cos and Allayne Butcher Rm # 9 Bathing/ Shower/ Hygiene: May shower with protection but do not get wound dressing(s) wet. - May take dressing off and wash with antibacterial soap and water Negative Presssure Wound Therapy: Discontinue wound vac Home Health: [place] home [facility] for wound care. WOUND #1: - Breast Wound Laterality: Right Cleanser: Wound Cleanser (DME) (Generic) 3 x Per Week/15 Days Discharge Instructions: Cleanse the wound with wound cleanser prior to applying a clean dressing using gauze sponges, not tissue or cotton balls. Peri-Wound Care: Skin Prep (DME) (Generic) 3 x Per Week/15 Days Discharge Instructions: Use skin prep as directed Prim Dressing:  Hydrofera Blue Classic Foam Rope Dressing, 9x6 (mm/in) 3 x Per Week/15 Days ary Discharge Instructions: Pack gently into wound bed, leaving tail(wider part) out of wound. Moisten with saline after packing in Secondary Dressing: Woven Gauze Sponge, Non-Sterile 4x4 in (DME) (Generic) 3 x Per Week/15 Days Discharge Instructions: In clinic Apply over primary dressing as directed. Secondary Dressing: Zetuvit Plus Silicone Border Dressing 4x4 (in/in) (DME) (Generic) 3 x Per Week/15 Days Discharge Instructions: Apply silicone border over primary dressing as directed. 1. I would recommend currently based on what I am seeing that we go ahead and have the patient continue with the Hydrofera Blue rope treatment that we can cut there is tapering of the bottom so to allow for this area to grow in from the bottom outward I think this is absolutely appropriate. 2. Also can recommend that we have the patient continue with  the Zetuvit bordered foam dressing to cover. 3. I am also going to suggest patient should change this dressing on a regular basis. I think every 2 days or 3 times a week is perfect. We will see patient back for reevaluation in 1 week here in the clinic. If anything worsens or changes patient will contact our office for additional recommendations. Electronic Signature(s) Signed: 04/07/2022 1:53:10 PM By: Worthy Keeler PA-C Entered By: Worthy Keeler on 04/07/2022 13:53:09 -------------------------------------------------------------------------------- SuperBill Details Patient Name: Date of Service: [name] 04/07/2022 Medical Record Number: [id number] Patient Account Number: [id number] Date of Birth/Sex: Treating RN: [birth year] (76 y.o. [name], [name] Primary Care Provider: [name] Other Clinician: Referring Provider: Treating Provider/Extender: [name] in Treatment: 10 Diagnosis Coding ICD-10 Codes Code Description T81.31XA  Disruption of external operation (surgical) wound, not elsewhere classified, initial encounter L98.492 Non-pressure chronic ulcer of skin of other sites with fat layer exposed E83.59 Other disorders of calcium metabolism L59.8 Other specified disorders of the skin and subcutaneous tissue related to radiation D68.61 Antiphospholipid syndrome I10 Essential (primary) hypertension I25.10 Atherosclerotic heart disease of native coronary artery without angina pectoris Facility Procedures Physician Procedures : CPT4 Code Description Modifier [id number] - WC PHYS LEVEL 4 - EST PT ICD-10 Diagnosis Description T81.31XA Disruption of external operation (surgical) wound, not elsewhere classified, initial encounter L98.492 Non-pressure chronic ulcer of skin of  other sites with fat layer exposed E83.59 Other disorders of calcium metabolism L59.8 Other specified disorders of the skin and subcutaneous tissue related to radiation Quantity: 1 Electronic Signature(s) Signed: 04/07/2022 1:54:08 PM By: Worthy Keeler PA-C Entered By: Worthy Keeler on 04/07/2022 13:54:07

## 2022-04-08 NOTE — Progress Notes (Signed)
[name], [name] ([id number]) Visit Report for 04/07/2022 Arrival Information Details Patient Name: Date of Service: [name], [name] 04/07/2022 11:30 A M Medical Record Number: [id number] Patient Account Number: [id number] Date of Birth/Sex: Treating RN: 1945-08-04 (76 y.o. [name], [name] Primary Care Chrishawn Boley: [name] Other Clinician: Referring Noura Purpura: Treating Barnaby Rippeon/Extender: [name] in Treatment: 10 Visit Information History Since Last Visit Added or deleted any medications: No Patient Arrived: Ambulatory Any new allergies or adverse reactions: No Arrival Time: 11:28 Had a fall or experienced change in No Accompanied By: self activities of daily living that may affect Transfer Assistance: None risk of falls: Patient Identification Verified: Yes Signs or symptoms of abuse/neglect since last visito No Secondary Verification Process Completed: Yes Hospitalized since last visit: No Patient Requires Transmission-Based Precautions: No Implantable device outside of the clinic excluding No Patient Has Alerts: Yes cellular tissue based products placed in the center Patient Alerts: Patient on Blood Thinner since last visit: Has Dressing in Place as Prescribed: Yes Pain Present Now: Yes Electronic Signature(s) Signed: 04/08/2022 4:08:15 PM By: Rhae Hammock RN Entered By: Rhae Hammock on 04/07/2022 11:29:16 -------------------------------------------------------------------------------- Clinic Level of Care Assessment Details Patient Name: Date of Service: [name] 04/07/2022 11:30 A M Medical Record Number: [id number] Patient Account Number: [id number] Date of Birth/Sex: Treating RN: February 21, 1946 (76 y.o. [name], [name] Primary Care Keithen Capo: [name] Other Clinician: Referring Milda Lindvall: Treating Ivan Lacher/Extender: [name] in Treatment: 10 Clinic Level of Care  Assessment Items TOOL 4 Quantity Score X- 1 0 Use when only an EandM is performed on FOLLOW-UP visit ASSESSMENTS - Nursing Assessment / Reassessment X- 1 10 Reassessment of Co-morbidities (includes updates in patient status) X- 1 5 Reassessment of Adherence to Treatment Plan ASSESSMENTS - Wound and Skin A ssessment / Reassessment X - Simple Wound Assessment / Reassessment - one wound 1 5 '[]'$  - 0 Complex Wound Assessment / Reassessment - multiple wounds '[]'$  - 0 Dermatologic / Skin Assessment (not related to wound area) ASSESSMENTS - Focused Assessment '[]'$  - 0 Circumferential Edema Measurements - multi extremities '[]'$  - 0 Nutritional Assessment / Counseling / Intervention '[]'$  - 0 Lower Extremity Assessment (monofilament, tuning fork, pulses) '[]'$  - 0 Peripheral Arterial Disease Assessment (using hand held doppler) ASSESSMENTS - Ostomy and/or Continence Assessment and Care '[]'$  - 0 Incontinence Assessment and Management '[]'$  - 0 Ostomy Care Assessment and Management (repouching, etc.) PROCESS - Coordination of Care X - Simple Patient / Family Education for ongoing care 1 15 '[]'$  - 0 Complex (extensive) Patient / Family Education for ongoing care X- 1 10 Staff obtains Programmer, systems, Records, T Results / Process Orders est '[]'$  - 0 Staff telephones HHA, Nursing Homes / Clarify orders / etc '[]'$  - 0 Routine Transfer to another Facility (non-emergent condition) '[]'$  - 0 Routine Hospital Admission (non-emergent condition) '[]'$  - 0 New Admissions / Biomedical engineer / Ordering NPWT Apligraf, etc. , '[]'$  - 0 Emergency Hospital Admission (emergent condition) '[]'$  - 0 Simple Discharge Coordination '[]'$  - 0 Complex (extensive) Discharge Coordination PROCESS - Special Needs '[]'$  - 0 Pediatric / Minor Patient Management '[]'$  - 0 Isolation Patient Management '[]'$  - 0 Hearing / Language / Visual special needs '[]'$  - 0 Assessment of Community assistance (transportation, D/C planning, etc.) '[]'$  -  0 Additional assistance / Altered mentation '[]'$  - 0 Support Surface(s) Assessment (bed, cushion, seat, etc.) INTERVENTIONS - Wound Cleansing / Measurement X - Simple Wound Cleansing - one wound 1 5 '[]'$  -  0 Complex Wound Cleansing - multiple wounds X- 1 5 Wound Imaging (photographs - any number of wounds) '[]'$  - 0 Wound Tracing (instead of photographs) X- 1 5 Simple Wound Measurement - one wound '[]'$  - 0 Complex Wound Measurement - multiple wounds INTERVENTIONS - Wound Dressings X - Small Wound Dressing one or multiple wounds 1 10 '[]'$  - 0 Medium Wound Dressing one or multiple wounds '[]'$  - 0 Large Wound Dressing one or multiple wounds X- 1 5 Application of Medications - topical '[]'$  - 0 Application of Medications - injection INTERVENTIONS - Miscellaneous '[]'$  - 0 External ear exam '[]'$  - 0 Specimen Collection (cultures, biopsies, blood, body fluids, etc.) '[]'$  - 0 Specimen(s) / Culture(s) sent or taken to Lab for analysis '[]'$  - 0 Patient Transfer (multiple staff / Civil Service fast streamer / Similar devices) '[]'$  - 0 Simple Staple / Suture removal (25 or less) '[]'$  - 0 Complex Staple / Suture removal (26 or more) '[]'$  - 0 Hypo / Hyperglycemic Management (close monitor of Blood Glucose) '[]'$  - 0 Ankle / Brachial Index (ABI) - do not check if billed separately X- 1 5 Vital Signs Has the patient been seen at the hospital within the last three years: Yes Total Score: 80 Level Of Care: New/Established - Level 3 Electronic Signature(s) Signed: 04/08/2022 4:08:15 PM By: Rhae Hammock RN Entered By: Rhae Hammock on 04/07/2022 12:01:34 -------------------------------------------------------------------------------- Encounter Discharge Information Details Patient Name: Date of Service: [name]. 04/07/2022 11:30 A M Medical Record Number: [id number] Patient Account Number: [id number] Date of Birth/Sex: Treating RN: [birth year] (76 y.o. [name], [name] Primary Care Eleah Lahaie: [name]  Other Clinician: Referring Ota Ebersole: Treating Kobie Whidby/Extender: [name] in Treatment: 10 Encounter Discharge Information Items Discharge Condition: Stable Ambulatory Status: Ambulatory Discharge Destination: Home Transportation: Private Auto Accompanied By: self Schedule Follow-up Appointment: Yes Clinical Summary of Care: Patient Declined Electronic Signature(s) Signed: 04/08/2022 4:08:15 PM By: Rhae Hammock RN Entered By: Rhae Hammock on 04/07/2022 12:03:24 -------------------------------------------------------------------------------- Lower Extremity Assessment Details Patient Name: Date of Service: [name]. 04/07/2022 11:30 A M Medical Record Number: [id number] Patient Account Number: [id number] Date of Birth/Sex: Treating RN: 07-Feb-1946 (76 y.o. [name], [name] Primary Care Belvie Iribe: [name] Other Clinician: Referring Lashawnna Lambrecht: Treating Sofhia Ulibarri/Extender: Renae Fickle Weeks in Treatment: 10 Electronic Signature(s) Signed: 04/08/2022 4:08:15 PM By: Rhae Hammock RN Entered By: Rhae Hammock on 04/07/2022 11:29:48 -------------------------------------------------------------------------------- [name] Details Patient Name: Date of Service: [name], [name] 04/07/2022 11:30 A M Medical Record Number: [id number] Patient Account Number: [id number] Date of Birth/Sex: Treating RN: 07/02/1946 (76 y.o. [name], [name] Primary Care Tyray Proch: [name] Other Clinician: Referring Dynver Clemson: Treating Janyce Ellinger/Extender: [name] in Treatment: 10 Active Inactive Wound/Skin Impairment Nursing Diagnoses: Impaired tissue integrity Knowledge deficit related to ulceration/compromised skin integrity Goals: Patient will have a decrease in wound volume by X% from date: (specify in notes) Date Initiated: 01/27/2022 Target Resolution Date:  04/24/2022 Goal Status: Active Patient/caregiver will verbalize understanding of skin care regimen Date Initiated: 01/27/2022 Target Resolution Date: 04/23/2022 Goal Status: Active Ulcer/skin breakdown will have a volume reduction of 30% by week 4 Date Initiated: 01/27/2022 Target Resolution Date: 04/22/2022 Goal Status: Active Interventions: Assess patient/caregiver ability to obtain necessary supplies Assess patient/caregiver ability to perform ulcer/skin care regimen upon admission and as needed Assess ulceration(s) every visit Notes: Electronic Signature(s) Signed: 04/08/2022 4:08:15 PM By: Rhae Hammock RN Entered By: Rhae Hammock on 04/07/2022 11:07:26 -------------------------------------------------------------------------------- Pain Assessment Details Patient Name:  Date of Service: [name], [name] 04/07/2022 11:30 A M Medical Record Number: [id number] Patient Account Number: [id number] Date of Birth/Sex: Treating RN: October 09, 1945 (76 y.o. [name], [name] Primary Care Michal Callicott: [name] Other Clinician: Referring Adain Geurin: Treating Majesta Leichter/Extender: [name] in Treatment: 10 Active Problems Location of Pain Severity and Description of Pain Patient Has Paino Yes Site Locations Pain Location: Pain Location: Pain in Ulcers With Dressing Change: Yes Duration of the Pain. Constant / Intermittento Intermittent Rate the pain. Current Pain Level: 5 Worst Pain Level: 10 Least Pain Level: 0 Tolerable Pain Level: 5 Character of Pain Describe the Pain: Aching Pain Management and Medication Current Pain Management: Medication: No Cold Application: No Rest: No Massage: No Activity: No T.E.N.S.: No Heat Application: No Leg drop or elevation: No Is the Current Pain Management Adequate: Adequate How does your wound impact your activities of daily livingo Sleep: No Bathing: No Appetite: No Relationship With Others:  No Bladder Continence: No Emotions: No Bowel Continence: No Work: No Toileting: No Drive: No Dressing: No Hobbies: No Electronic Signature(s) Signed: 04/08/2022 4:08:15 PM By: Rhae Hammock RN Entered By: Rhae Hammock on 04/07/2022 11:29:40 -------------------------------------------------------------------------------- Patient/Caregiver Education Details Patient Name: Date of Service: [name] 9/13/2023andnbsp11:30 [place] Record Number: [id number] Patient Account Number: [id number] Date of Birth/Gender: Treating RN: 11-18-1945 (76 y.o. [name], [name] Primary Care Physician: [name] Other Clinician: Referring Physician: Treating Physician/Extender: [name] in Treatment: 10 Education Assessment Education Provided To: Patient Education Topics Provided Wound/Skin Impairment: Methods: Explain/Verbal Responses: Reinforcements needed, State content correctly Electronic Signature(s) Signed: 04/08/2022 4:08:15 PM By: Rhae Hammock RN Entered By: Rhae Hammock on 04/07/2022 11:07:36 -------------------------------------------------------------------------------- Wound Assessment Details Patient Name: Date of Service: [name]. 04/07/2022 11:30 A M Medical Record Number: [id number] Patient Account Number: [id number] Date of Birth/Sex: Treating RN: [birth year] (76 y.o. 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 Etiology: Date Acquired: 08/26/2021 Wound Open Weeks Of Treatment: 10 Status: Clustered Wound: No Comorbid Asthma, Deep Vein Thrombosis, Hypertension, Peripheral Arterial History: Disease,  Hepatitis B, Osteoarthritis, Neuropathy, Received Radiation Photos Wound Measurements Length: (cm) 0.4 Width: (cm) 0.4 Depth: (cm) 1 Area: (cm) 0.126 Volume: (cm) 0.126 % Reduction in Area: -77.5% % Reduction in Volume: -77.5% Epithelialization: Small (1-33%) Tunneling: Yes Position (o'clock): 7 Maximum Distance: (cm) 2.5 Wound Description Classification: Full Thickness With Exposed Support Structures Wound Margin: Distinct, outline attached Exudate Amount: Large Exudate Type: Serosanguineous Exudate Color: red, brown Foul Odor After Cleansing: Yes Due to Product Use: Yes Slough/Fibrino Yes Wound Bed Granulation Amount: Large (67-100%) Exposed Structure Granulation Quality: Red Fascia Exposed: No Necrotic Amount: Small (1-33%) Fat Layer (Subcutaneous Tissue) Exposed: Yes Necrotic Quality: Adherent Slough Tendon Exposed: No Muscle Exposed: No Joint Exposed: No Bone Exposed: No Treatment Notes Wound #1 (Breast) Wound Laterality: Right Cleanser Wound Cleanser Discharge Instruction: Cleanse the wound with wound cleanser prior to applying a clean dressing using gauze sponges, not tissue or cotton balls. Peri-Wound Care Skin Prep Discharge Instruction: Use skin prep as directed Topical Primary Dressing Hydrofera Blue Classic Foam Rope Dressing, 9x6 (mm/in) Discharge Instruction: Pack gently into wound bed, leaving tail(wider part) out of wound. Moisten with saline after packing in Secondary Dressing Woven Gauze Sponge, Non-Sterile 4x4 in Discharge Instruction: In clinic Apply  over primary dressing as directed. Zetuvit Plus Silicone Border Dressing 4x4 (in/in) Discharge Instruction: Apply silicone border over primary dressing as directed. Secured With Compression Wrap Compression Stockings Environmental education officer) Signed: 04/07/2022 5:59:54 PM By: Deon Pilling RN, BSN Signed: 04/08/2022 4:08:15 PM By: Rhae Hammock RN Entered By: Deon Pilling on  04/07/2022 11:37:39 -------------------------------------------------------------------------------- Vitals Details Patient Name: Date of Service: [name]. 04/07/2022 11:30 A M Medical Record Number: [id number] Patient Account Number: [id number] Date of Birth/Sex: Treating RN: 1945/11/29 (76 y.o. [name], [name] Primary Care Madalaine Portier: [name] Other Clinician: Referring Charnell Peplinski: Treating Ariel Wingrove/Extender: [name] in Treatment: 10 Vital Signs Time Taken: 11:28 Temperature (F): 97.6 Height (in): 64 Pulse (bpm): 76 Weight (lbs): 180 Respiratory Rate (breaths/min): 17 Body Mass Index (BMI): 30.9 Blood Pressure (mmHg): 160/92 Reference Range: 80 - 120 mg / dl Electronic Signature(s) Signed: 04/08/2022 4:08:15 PM By: Rhae Hammock RN Entered By: Rhae Hammock on 04/07/2022 11:29:06

## 2022-04-21 ENCOUNTER — Encounter (HOSPITAL_BASED_OUTPATIENT_CLINIC_OR_DEPARTMENT_OTHER): Payer: Medicare HMO | Admitting: Physician Assistant

## 2022-04-21 DIAGNOSIS — L98492 Non-pressure chronic ulcer of skin of other sites with fat layer exposed: Secondary | ICD-10-CM | POA: Diagnosis not present

## 2022-04-21 NOTE — Progress Notes (Signed)
LEONA, ALEN ([id number]) Visit Report for 04/21/2022 Chief Complaint Document Details Patient Name: Date of Service: ANWEN, CANNEDY 04/21/2022 2:30 PM Medical Record Number: [id number] Patient Account Number: [id number] Date of Birth/Sex: Treating RN: 09-Mar-1946 (76 y.o. [name], [name] Primary Care Provider: [name] Other Clinician: Referring Provider: Treating Provider/Extender: [name] in Treatment: 12 Information Obtained from: Patient Chief Complaint Right breast ulcer Electronic Signature(s) Signed: 04/21/2022 2:48:02 PM By: Worthy Keeler PA-C Entered By: Worthy Keeler on 04/21/2022 14:48:02 -------------------------------------------------------------------------------- Problem List Details Patient Name: Date of Service: KEALOHILANI, MAIORINO 04/21/2022 2:30 PM Medical Record Number: [id number] Patient Account Number: [id number] Date of Birth/Sex: Treating RN: 08-31-1945 (76 y.o. 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

## 2022-04-22 NOTE — Progress Notes (Signed)
[name], [name] ([id number]) Visit Report for 04/21/2022 Arrival Information Details Patient Name: Date of Service: [name], [name] 04/21/2022 2:30 PM Medical Record Number: [id number] Patient Account Number: [id number] Date of Birth/Sex: Treating RN: [birth year] (76 y.o. [name], [name] Primary Care Harbor Vanover: [name] Other Clinician: Referring Landers Prajapati: Treating Crescent Gotham/Extender: [name] in Treatment: 12 Visit Information History Since Last Visit Added or deleted any medications: No Patient Arrived: Ambulatory Any new allergies or adverse reactions: No Arrival Time: 15:01 Had a fall or experienced change in No Accompanied By: self activities of daily living that may affect Transfer Assistance: None risk of falls: Patient Identification Verified: Yes Signs or symptoms of abuse/neglect since last visito No Secondary Verification Process Completed: Yes Hospitalized since last visit: No Patient Requires Transmission-Based Precautions: No Implantable device outside of the clinic excluding No Patient Has Alerts: Yes cellular tissue based products placed in the center Patient Alerts: Patient on Blood Thinner since last visit: Has Dressing in Place as Prescribed: Yes Pain Present Now: No Electronic Signature(s) Signed: 04/22/2022 4:19:15 PM By: Rhae Hammock RN Entered By: Rhae Hammock on 04/21/2022 15:02:00 -------------------------------------------------------------------------------- Clinic Level of Care Assessment Details Patient Name: Date of Service: [name], [name] 04/21/2022 2:30 PM Medical Record Number: [id number] Patient Account Number: [id number] Date of Birth/Sex: Treating RN: Jan 20, 1946 (76 y.o. [name], [name] Primary Care Melida Northington: [name] Other Clinician: Referring Aydrian Halpin: Treating Jianni Shelden/Extender: [name] in Treatment: 12 Clinic Level of Care Assessment  Items TOOL 4 Quantity Score X- 1 0 Use when only an EandM is performed on FOLLOW-UP visit ASSESSMENTS - Nursing Assessment / Reassessment X- 1 10 Reassessment of Co-morbidities (includes updates in patient status) X- 1 5 Reassessment of Adherence to Treatment Plan ASSESSMENTS - Wound and Skin A ssessment / Reassessment X - Simple Wound Assessment / Reassessment - one wound 1 5 '[]'$  - 0 Complex Wound Assessment / Reassessment - multiple wounds '[]'$  - 0 Dermatologic / Skin Assessment (not related to wound area) ASSESSMENTS - Focused Assessment '[]'$  - 0 Circumferential Edema Measurements - multi extremities '[]'$  - 0 Nutritional Assessment / Counseling / Intervention '[]'$  - 0 Lower Extremity Assessment (monofilament, tuning fork, pulses) '[]'$  - 0 Peripheral Arterial Disease Assessment (using hand held doppler) ASSESSMENTS - Ostomy and/or Continence Assessment and Care '[]'$  - 0 Incontinence Assessment and Management '[]'$  - 0 Ostomy Care Assessment and Management (repouching, etc.) PROCESS - Coordination of Care X - Simple Patient / Family Education for ongoing care 1 15 '[]'$  - 0 Complex (extensive) Patient / Family Education for ongoing care X- 1 10 Staff obtains Programmer, systems, Records, T Results / Process Orders est '[]'$  - 0 Staff telephones HHA, Nursing Homes / Clarify orders / etc '[]'$  - 0 Routine Transfer to another Facility (non-emergent condition) '[]'$  - 0 Routine Hospital Admission (non-emergent condition) '[]'$  - 0 New Admissions / Biomedical engineer / Ordering NPWT Apligraf, etc. , '[]'$  - 0 Emergency Hospital Admission (emergent condition) X- 1 10 Simple Discharge Coordination '[]'$  - 0 Complex (extensive) Discharge Coordination PROCESS - Special Needs '[]'$  - 0 Pediatric / Minor Patient Management '[]'$  - 0 Isolation Patient Management '[]'$  - 0 Hearing / Language / Visual special needs '[]'$  - 0 Assessment of Community assistance (transportation, D/C planning, etc.) '[]'$  - 0 Additional  assistance / Altered mentation '[]'$  - 0 Support Surface(s) Assessment (bed, cushion, seat, etc.) INTERVENTIONS - Wound Cleansing / Measurement X - Simple Wound Cleansing - one wound 1 5 '[]'$  - 0 Complex  Wound Cleansing - multiple wounds X- 1 5 Wound Imaging (photographs - any number of wounds) '[]'$  - 0 Wound Tracing (instead of photographs) X- 1 5 Simple Wound Measurement - one wound '[]'$  - 0 Complex Wound Measurement - multiple wounds INTERVENTIONS - Wound Dressings X - Small Wound Dressing one or multiple wounds 1 10 '[]'$  - 0 Medium Wound Dressing one or multiple wounds '[]'$  - 0 Large Wound Dressing one or multiple wounds '[]'$  - 0 Application of Medications - topical '[]'$  - 0 Application of Medications - injection INTERVENTIONS - Miscellaneous '[]'$  - 0 External ear exam '[]'$  - 0 Specimen Collection (cultures, biopsies, blood, body fluids, etc.) '[]'$  - 0 Specimen(s) / Culture(s) sent or taken to Lab for analysis '[]'$  - 0 Patient Transfer (multiple staff / Civil Service fast streamer / Similar devices) '[]'$  - 0 Simple Staple / Suture removal (25 or less) '[]'$  - 0 Complex Staple / Suture removal (26 or more) '[]'$  - 0 Hypo / Hyperglycemic Management (close monitor of Blood Glucose) '[]'$  - 0 Ankle / Brachial Index (ABI) - do not check if billed separately X- 1 5 Vital Signs Has the patient been seen at the hospital within the last three years: Yes Total Score: 85 Level Of Care: New/Established - Level 3 Electronic Signature(s) Signed: 04/22/2022 4:19:15 PM By: Rhae Hammock RN Entered By: Rhae Hammock on 04/21/2022 16:21:34 -------------------------------------------------------------------------------- Encounter Discharge Information Details Patient Name: Date of Service: [name] 04/21/2022 2:30 PM Medical Record Number: [id number] Patient Account Number: [id number] Date of Birth/Sex: Treating RN: 1946/07/16 (76 y.o. [name], [name] Primary Care Vanilla Heatherington: [name] Other  Clinician: Referring Dannika Hilgeman: Treating Antonea Gaut/Extender: [name] in Treatment: 12 Encounter Discharge Information Items Discharge Condition: Stable Ambulatory Status: Ambulatory Discharge Destination: Home Transportation: Private Auto Accompanied By: self Schedule Follow-up Appointment: Yes Clinical Summary of Care: Patient Declined Electronic Signature(s) Signed: 04/22/2022 4:19:15 PM By: Rhae Hammock RN Entered By: Rhae Hammock on 04/21/2022 16:22:04 -------------------------------------------------------------------------------- Lower Extremity Assessment Details Patient Name: Date of Service: [name] 04/21/2022 2:30 PM Medical Record Number: [id number] Patient Account Number: [id number] Date of Birth/Sex: Treating RN: January 03, 1946 (76 y.o. [name], [name] Primary Care Suann Klier: [name] Other Clinician: Referring Ran Tullis: Treating Carolle Ishii/Extender: [name] in Treatment: 12 Electronic Signature(s) Signed: 04/22/2022 4:19:15 PM By: Rhae Hammock RN Entered By: Rhae Hammock on 04/21/2022 15:04:46 -------------------------------------------------------------------------------- Multi-Disciplinary Care Plan Details Patient Name: Date of Service: [name] 04/21/2022 2:30 PM Medical Record Number: [id number] Patient Account Number: [id number] Date of Birth/Sex: Treating RN: [birth year] (76 y.o. [name], [name] Primary Care Adlynn Lowenstein: [name] Other Clinician: Referring Kolbie Clarkston: Treating Darah Simkin/Extender: [name] in Treatment: 12 Active Inactive Wound/Skin Impairment Nursing Diagnoses: Impaired tissue integrity Knowledge deficit related to ulceration/compromised skin integrity Goals: Patient will have a decrease in wound volume by X% from date: (specify in notes) Date Initiated: 01/27/2022 Target Resolution Date:  04/24/2022 Goal Status: Active Patient/caregiver will verbalize understanding of skin care regimen Date Initiated: 01/27/2022 Target Resolution Date: 04/23/2022 Goal Status: Active Ulcer/skin breakdown will have a volume reduction of 30% by week 4 Date Initiated: 01/27/2022 Target Resolution Date: 04/22/2022 Goal Status: Active Interventions: Assess patient/caregiver ability to obtain necessary supplies Assess patient/caregiver ability to perform ulcer/skin care regimen upon admission and as needed Assess ulceration(s) every visit Notes: Electronic Signature(s) Signed: 04/22/2022 4:19:15 PM By: Rhae Hammock RN Entered By: Rhae Hammock on 04/21/2022 15:13:54 -------------------------------------------------------------------------------- Pain Assessment Details Patient Name: Date of Service: [name]  P. 04/21/2022 2:30 PM Medical Record Number: [id number] Patient Account Number: [id number] Date of Birth/Sex: Treating RN: 12/10/1945 (76 y.o. [name], [name] Primary Care Jasslyn Finkel: [name] Other Clinician: Referring Mekenna Finau: Treating Desirie Minteer/Extender: [name] in Treatment: 12 Active Problems Location of Pain Severity and Description of Pain Patient Has Paino Yes Site Locations Pain Location: Pain Location: Pain in Ulcers With Dressing Change: Yes Duration of the Pain. Constant / Intermittento Intermittent Rate the pain. Current Pain Level: 2 Worst Pain Level: 10 Least Pain Level: 0 Tolerable Pain Level: 2 Character of Pain Describe the Pain: Aching Pain Management and Medication Current Pain Management: Medication: No Cold Application: No Rest: No Massage: No Activity: No T.E.N.S.: No Heat Application: No Leg drop or elevation: No Is the Current Pain Management Adequate: Adequate How does your wound impact your activities of daily livingo Sleep: No Bathing: No Appetite: No Relationship With Others:  No Bladder Continence: No Emotions: No Bowel Continence: No Work: No Toileting: No Drive: No Dressing: No Hobbies: No Electronic Signature(s) Signed: 04/22/2022 4:19:15 PM By: Rhae Hammock RN Entered By: Rhae Hammock on 04/21/2022 15:04:40 -------------------------------------------------------------------------------- Patient/Caregiver Education Details Patient Name: Date of Service: [name] 9/27/2023andnbsp2:30 PM Medical Record Number: [id number] Patient Account Number: [id number] Date of Birth/Gender: Treating RN: 05/04/1946 (76 y.o. [name], [name] Primary Care Physician: [name] Other Clinician: Referring Physician: Treating Physician/Extender: [name] in Treatment: 12 Education Assessment Education Provided To: Patient Education Topics Provided Basic Hygiene: Methods: Explain/Verbal Responses: State content correctly Electronic Signature(s) Signed: 04/22/2022 4:19:15 PM By: Rhae Hammock RN Entered By: Rhae Hammock on 04/21/2022 15:14:09 -------------------------------------------------------------------------------- Wound Assessment Details Patient Name: Date of Service: [name] 04/21/2022 2:30 PM Medical Record Number: [id number] Patient Account Number: [id number] Date of Birth/Sex: Treating RN: [birth year] (76 y.o. 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 Etiology: Date Acquired: 08/26/2021 Wound Open Weeks Of Treatment: 12 Status: Clustered Wound: No Comorbid Asthma, Deep Vein Thrombosis, Hypertension, Peripheral Arterial History: Disease, Hepatitis B, Osteoarthritis, Neuropathy,  Received Radiation Photos Wound Measurements Length: (cm) 0.5 Width: (cm) 0.7 Depth: (cm) 0.7 Area: (cm) 0.275 Volume: (cm) 0.192 % Reduction in Area: -287.3% % Reduction in Volume: -170.4% Epithelialization: Small (1-33%) Tunneling: Yes Position (o'clock): 9 Maximum Distance: (cm) 1.7 Undermining: No Wound Description Classification: Full Thickness With Exposed Support Structures Wound Margin: Distinct, outline attached Exudate Amount: Large Exudate Type: Serosanguineous Exudate Color: red, brown Foul Odor After Cleansing: Yes Due to Product Use: Yes Slough/Fibrino Yes Wound Bed Granulation Amount: Large (67-100%) Exposed Structure Granulation Quality: Red Fascia Exposed: No Necrotic Amount: Small (1-33%) Fat Layer (Subcutaneous Tissue) Exposed: Yes Necrotic Quality: Adherent Slough Tendon Exposed: No Muscle Exposed: No Joint Exposed: No Bone Exposed: No Treatment Notes Wound #1 (Breast) Wound Laterality: Right Cleanser Wound Cleanser Discharge Instruction: Cleanse the wound with wound cleanser prior to applying a clean dressing using gauze sponges, not tissue or cotton balls. Peri-Wound Care Skin Prep Discharge Instruction: Use skin prep as directed Topical Primary Dressing Hydrofera Blue Classic Foam Rope Dressing, 9x6 (mm/in) Discharge Instruction: Pack gently into wound bed, leaving tail(wider part) out of wound. Moisten with saline after packing in. Cut the hydrofera blue pieces in half Secondary Dressing Woven Gauze Sponge, Non-Sterile 4x4 in Discharge Instruction: In clinic Apply over  primary dressing as directed. Zetuvit Plus Silicone Border Dressing 4x4 (in/in) Discharge Instruction: Apply silicone border over primary dressing as directed. Secured With Compression Wrap Compression Stockings Environmental education officer) Signed: 04/22/2022 4:19:15 PM By: Rhae Hammock RN Entered By: Rhae Hammock on 04/21/2022  15:10:34 -------------------------------------------------------------------------------- Vitals Details Patient Name: Date of Service: [name] 04/21/2022 2:30 PM Medical Record Number: [id number] Patient Account Number: [id number] Date of Birth/Sex: Treating RN: May 03, 1946 (76 y.o. [name], [name] Primary Care Sotiria Keast: [name] Other Clinician: Referring Hedda Crumbley: Treating Mariya Mottley/Extender: [name] in Treatment: 12 Vital Signs Time Taken: 15:02 Temperature (F): 98 Height (in): 64 Pulse (bpm): 91 Weight (lbs): 180 Respiratory Rate (breaths/min): 17 Body Mass Index (BMI): 30.9 Blood Pressure (mmHg): 158/93 Reference Range: 80 - 120 mg / dl Electronic Signature(s) Signed: 04/22/2022 4:19:15 PM By: Rhae Hammock RN Entered By: Rhae Hammock on 04/21/2022 15:03:52

## 2022-04-29 ENCOUNTER — Other Ambulatory Visit: Payer: Medicare HMO

## 2022-04-29 ENCOUNTER — Inpatient Hospital Stay: Payer: Medicare HMO

## 2022-05-05 ENCOUNTER — Encounter (HOSPITAL_BASED_OUTPATIENT_CLINIC_OR_DEPARTMENT_OTHER): Payer: Medicare HMO | Attending: Physician Assistant | Admitting: Physician Assistant

## 2022-05-05 DIAGNOSIS — L98492 Non-pressure chronic ulcer of skin of other sites with fat layer exposed: Secondary | ICD-10-CM | POA: Diagnosis not present

## 2022-05-05 DIAGNOSIS — D6861 Antiphospholipid syndrome: Secondary | ICD-10-CM | POA: Insufficient documentation

## 2022-05-05 DIAGNOSIS — I2510 Atherosclerotic heart disease of native coronary artery without angina pectoris: Secondary | ICD-10-CM | POA: Diagnosis not present

## 2022-05-05 DIAGNOSIS — T8131XA Disruption of external operation (surgical) wound, not elsewhere classified, initial encounter: Secondary | ICD-10-CM | POA: Diagnosis present

## 2022-05-05 DIAGNOSIS — L598 Other specified disorders of the skin and subcutaneous tissue related to radiation: Secondary | ICD-10-CM | POA: Insufficient documentation

## 2022-05-05 DIAGNOSIS — Z923 Personal history of irradiation: Secondary | ICD-10-CM | POA: Insufficient documentation

## 2022-05-05 DIAGNOSIS — Y838 Other surgical procedures as the cause of abnormal reaction of the patient, or of later complication, without mention of misadventure at the time of the procedure: Secondary | ICD-10-CM | POA: Diagnosis not present

## 2022-05-05 DIAGNOSIS — I10 Essential (primary) hypertension: Secondary | ICD-10-CM | POA: Diagnosis not present

## 2022-05-05 NOTE — Progress Notes (Addendum)
TRYSTA, SHOWMAN ([id number]) [id number] Page 1 of 7 Visit Report for 05/05/2022 Chief Complaint Document Details Patient Name: Date of Service: [name], [name] 05/05/2022 2:30 PM Medical Record Number: [id number] Patient Account Number: [id number] Date of Birth/Sex: Treating RN: 05/18/1946 (76 y.o. F) Primary Care Provider: [name] Other Clinician: Referring Provider: Treating Provider/Extender: [name] in Treatment: 14 Information Obtained from: Patient Chief Complaint Right breast ulcer Electronic Signature(s) Signed: 05/05/2022 2:46:39 PM By: Worthy Keeler PA-C Entered By: Worthy Keeler on 05/05/2022 14:46:39 -------------------------------------------------------------------------------- HPI Details Patient Name: Date of Service: [name] 05/05/2022 2:30 PM Medical Record Number: [id number] Patient Account Number: [id number] Date of Birth/Sex: Treating RN: Feb 28, 1946 (76 y.o. F) Primary Care Provider: [name] Other Clinician: Referring Provider: Treating Provider/Extender: [name] in Treatment: 14 History of Present Illness HPI Description: 01-27-2022 upon evaluation today patient presents for initial evaluation here in our clinic concerning issues she has been having with her right breast. She had an area of calcium buildup where she had actual staples that were being pushed out that were noted from her previous breast surgery which was roughly 23 years ago when she had cancer. At that point she had also undergone radiation therapy. 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 Nonetheless after her most recent surgery in February to remove the staples and the calcium deposit this has started to build back up due to the damage to the breast tissue. She was seen initially by Dr. Brantley Stage. Eventually he referred her to Dr.  Rebekah Chesterfield to see about a mastectomy to take care of the issue. With that being said this was something that was good to be reserved just for worst-case scenario in fact they were a little bit concerned about her reaction considering her blood thinners and the fact that she may not do as well and surgery is what they would like to see with all the other medical problems that she has. For that reason they referred her to [name] in order to see what we can do from a healing standpoint. 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 02-03-2022 upon evaluation today patient appears to be doing okay in regard to her wound on the breast. Fortunately I do not see any signs of worsening. Unfortunately I do believe that she is still having some discomfort also there was some misunderstanding about how to do the dressing which I think will make it a little bit easier she should be putting it in before waiting it and then after its in and situated she can wet this. I think that is going to be a lot easier for her than what she has been trying to do. 02-10-2022 upon evaluation today patient appears to be doing about the same in regard to her wound. She has been tolerating the dressing changes without complication. We are actually starting her on linezolid today actually had to apply to get this approved by her insurance I am hopeful that this will be affordable for her now it was going to cost her way too much before. With that being said I do believe that she is  headed in the right direction which is great news. No fevers, chills, nausea, vomiting, or diarrhea. 7/26; right breast apparently secondary to surgery earlier  this year for protruding staples and underlying traumatic calcifications. She has a nonhealing wound with some depth and tunneling. She has been using Hydrofera Blue with some improvement in measurements today. Nevertheless she has a wound VAC and we went ahead and applied this. I am not certain whether she is going to be eligible for home health but her staff are certainly going to work on it 02-24-2022 upon evaluation today patient appears to be doing excellent she is using the gauze VAC at this point and it seems to be doing a great job for her. Fortunately there does not appear to be any signs of active infection at this time which is excellent. No fever or chills noted 03-10-2022 upon evaluation today patient appears to be doing well with regard to her wound although the wound was definitely over packed compared to what it MARETTA, OVERDORF P ([id number]) [phone].pdf Page 2 of 7 needs to be. I think they were probably got to switch to [facility] Blue rope which I think would be much easier to put him than the current black foam which is what home health is actually using we will try to do a gauze VAC but to be honest there is not actually doing that. Fortunately there does not appear to be any signs of active infection locally or systemically at this time which is great news. I think the biggest issue I see is simply that we need to make sure and have the patient have the Hydrofera Blue rope which is again I think a much better way to go with this and avoid altogether the use of the black foam inside the wound and put a piece on the outside that is appropriate and fine. 03-17-2022 upon evaluation today patient appears to be doing well currently in regard to her wound. The wound VAC does seem to be helping the [facility] seems to be much better and very pleased in this regard. I do not see any signs of active infection locally or systemically at this time which is great news.  No fevers, chills, nausea, vomiting, or diarrhea. 03-24-2022 upon evaluation today patient appears to be doing well with regard to her wound. I am seeing signs of improvement which is great news. Fortunately she is not doing any worse unfortunately they did not have the supplies and her wound VAC on until yesterday but otherwise she is doing excellent. 03-31-2022 upon evaluation today patient appears to be doing well currently in regard to her wound. I do feel like the wound VAC is doing well we may be getting close to the point of not utilizing the wound VAC any longer the patient states that she would be happy to get to that point. Fortunately I do not see any evidence of active infection locally or systemically which is great news. 04-07-2022 upon evaluation today patient appears to be doing well currently in regard to the wound on her right breast area. Fortunately there does not appear to be any signs of infection at this time which is great news and overall I am extremely pleased with where we stand today. No fevers, chills, nausea, vomiting, or diarrhea. 04-21-2022 upon evaluation today patient appears to be doing well currently in regard to her wound. She has been tolerating the dressing changes with the Hydrofera Blue without complication and overall I am extremely  pleased with where things stand today. There does not appear to be any evidence of active infection locally or systemically at this time which is great news. No fevers, chills, nausea, vomiting, or diarrhea. I do believe that she is headed in the right direction here. 05-05-2022 upon evaluation today patient actually appears to be doing excellent in regard to her wound. This is showing signs of great improvement and very pleased with where we stand. I do not see any signs of infection locally or systemically at this time which is great news. No fevers, chills, nausea, vomiting, or diarrhea. Electronic Signature(s) Signed: 05/05/2022  3:37:03 PM By: Worthy Keeler PA-C Entered By: Worthy Keeler on 05/05/2022 15:37:03 -------------------------------------------------------------------------------- Physical Exam Details Patient Name: Date of Service: [name], [name] 05/05/2022 2:30 PM Medical Record Number: [id number] Patient Account Number: [id number] Date of Birth/Sex: Treating RN: 02/26/1946 (76 y.o. F) Primary Care Provider: [name] Other Clinician: Referring Provider: Treating Provider/Extender: Renae Fickle Weeks in Treatment: 73 Constitutional Well-nourished and well-hydrated in no acute distress. Respiratory normal breathing without difficulty. Psychiatric this patient is able to make decisions and demonstrates good insight into disease process. Alert and Oriented x 3. pleasant and cooperative. Notes Upon inspection patient's wound bed actually showed signs of good granulation and epithelization at this point. Fortunately I do not see any evidence of worsening overall and I am extremely pleased with where things stand currently. Electronic Signature(s) Signed: 05/05/2022 3:37:19 PM By: Worthy Keeler PA-C Entered By: Worthy Keeler on 05/05/2022 15:37:19 -------------------------------------------------------------------------------- Physician Orders Details Patient Name: Date of Service: [name] 05/05/2022 2:30 PM Lacinda Axon, Lelon Frohlich ([id number]) [id number] Page 3 of 7 Medical Record Number: [id number] Patient Account Number: [id number] Date of Birth/Sex: Treating RN: [birth year] (76 y.o. [name] Primary Care Provider: [name] Other Clinician: Referring Provider: Treating Provider/Extender: [name] in Treatment: [phone] Verbal / Phone Orders: No Diagnosis Coding ICD-10 Coding Code Description T81.31XA Disruption of external operation (surgical) wound, not elsewhere classified, initial  encounter L98.492 Non-pressure chronic ulcer of skin of other sites with fat layer exposed E83.59 Other disorders of calcium metabolism L59.8 Other specified disorders of the skin and subcutaneous tissue related to radiation D68.61 Antiphospholipid syndrome I10 Essential (primary) hypertension I25.10 Atherosclerotic heart disease of native coronary artery without angina pectoris Follow-up Appointments ppointment in 2 weeks. - w/ Jeri Cos and Elias Else # 9 Return A Bathing/ Shower/ Hygiene May shower with protection but do not get wound dressing(s) wet. - May take dressing off and wash with antibacterial soap and water Negative Presssure Wound Therapy Discontinue wound [facility] for wound care. Wound Treatment Wound #1 - Breast Wound Laterality: Right Cleanser: Normal Saline (DME) (Generic) 3 x Per Week/15 Days Discharge Instructions: Cleanse the wound with Normal Saline prior to applying a clean dressing using gauze sponges, not tissue or cotton balls. Cleanser: Wound Cleanser (Generic) 3 x Per Week/15 Days Discharge Instructions: Cleanse the wound with wound cleanser prior to applying a clean dressing using gauze sponges, not tissue or cotton balls. Peri-Wound Care: Skin Prep (Generic) 3 x Per Week/15 Days Discharge Instructions: Use skin prep as directed Prim Dressing: Endoform 2x2 in (DME) (Generic) 3 x Per Week/15 Days ary Discharge Instructions: Moisten with saline Secondary Dressing: Woven Gauze Sponge, Non-Sterile 4x4 in (Generic) 3 x Per Week/15 Days Discharge Instructions: In clinic Apply over primary dressing as directed. Secondary Dressing: Zetuvit Plus Silicone Border Dressing 4x4 (in/in) (  Generic) 3 x Per Week/15 Days Discharge Instructions: Apply silicone border over primary dressing as directed. Patient Medications llergies: bee pollen, Levaquin, phytonadione (vitamin K1), baclofen, cephalexin, Iodinated Contrast Media, Benadryl, Cipro,  Crestor, Cymbalta, Daypro, A doxycycline, levofloxacin, Sulfa (Sulfonamide Antibiotics), Zocor Notifications Medication Indication Start End prior to debridement 05/05/2022 lidocaine DOSE topical 5 % ointment - ointment topical Electronic Signature(s) Signed: 05/05/2022 5:12:24 PM By: Worthy Keeler PA-C Signed: 05/10/2022 1:44:39 PM By: Sharyn Creamer RN, BSN Entered By: Sharyn Creamer on 05/05/2022 15:31:07 Humber, Lelon Frohlich ([id number]) [id number] Page 4 of 7 -------------------------------------------------------------------------------- Problem List Details Patient Name: Date of Service: [name], [name] 05/05/2022 2:30 PM Medical Record Number: [id number] Patient Account Number: [id number] Date of Birth/Sex: Treating RN: 1945-12-15 (76 y.o. F) Primary Care Provider: [name] Other Clinician: Referring Provider: Treating Provider/Extender: [name] in Treatment: 14 Active Problems ICD-10 Encounter Code Description Active Date MDM Diagnosis T81.31XA Disruption of external operation (surgical) wound, not elsewhere classified, 01/27/2022 No Yes initial encounter L98.492 Non-pressure chronic ulcer of skin of other sites with fat layer exposed 01/27/2022 No Yes E83.59 Other disorders of calcium metabolism 01/27/2022 No Yes L59.8 Other specified disorders of the skin and subcutaneous tissue related to 01/27/2022 No Yes radiation D68.61 Antiphospholipid syndrome 01/27/2022 No Yes I10 Essential (primary) hypertension 01/27/2022 No Yes I25.10 Atherosclerotic heart disease of native coronary artery without angina pectoris 01/27/2022 No Yes Inactive Problems Resolved Problems Electronic Signature(s) Signed: 05/05/2022 2:45:02 PM By: Worthy Keeler PA-C Entered By: Worthy Keeler on 05/05/2022 14:45:02 -------------------------------------------------------------------------------- Progress Note Details Patient Name: Date of  Service: [name] 05/05/2022 2:30 PM Medical Record Number: [id number] Patient Account Number: [id number] Date of Birth/Sex: Treating RN: [birth year] (76 y.o. F) Primary Care Provider: [name] Other Clinician: Referring Provider: Treating Provider/Extender: Regenia Skeeter, Chauncey Fischer in Treatment: [address] [place] ([id number]) [id number] Page 5 of 7 Subjective Chief Complaint Information obtained from Patient Right breast ulcer History of Present Illness (HPI) 01-27-2022 upon evaluation today patient presents for initial evaluation here in our clinic concerning issues she has been having with her right breast. She had an area of calcium buildup where she had actual staples that were being pushed out that were noted from her previous breast surgery which was roughly 23 years ago when she had cancer. At that point she had also undergone radiation therapy. 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 Nonetheless after her most recent surgery in February to remove the staples and the calcium deposit this has started to build back up due to the damage to the breast tissue. She was seen initially by Dr. Brantley Stage. Eventually he referred her to Dr. Rebekah Chesterfield to see about a mastectomy to take care of the issue. With that being said this was something that was good to be reserved just for worst-case scenario in fact they were a little bit concerned about her reaction considering her blood thinners and the fact that she may not do as well and surgery is what they would like to see with all the other medical problems that she has. For that reason they referred her to [name] in order to see what we can do from a healing standpoint. 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 02-03-2022 upon evaluation today patient appears to be doing okay in regard to her wound on the breast. Fortunately I do not see any signs of worsening. Unfortunately I do believe that she is still having some discomfort also there was some misunderstanding about how to do the dressing which I think will make it a little bit easier she should be putting it in before waiting it and then after its in and situated she can wet this. I think that is going to be a lot easier for her than what she has been trying to do. 02-10-2022 upon evaluation today patient appears to be doing about the same in regard to her wound. She has been tolerating the dressing changes without complication. We are actually starting her on linezolid today actually had to apply to get this approved by her insurance I am hopeful that this will be affordable for her now it was going to cost her way too much before. With that being said I do believe that she is headed in the right direction which is great news. No fevers, chills, nausea, vomiting, or diarrhea. 7/26; right breast apparently secondary to surgery earlier this year for protruding staples and underlying traumatic calcifications. She has a nonhealing wound with some depth and tunneling. She has been using Hydrofera Blue with some improvement in measurements today. Nevertheless she has a wound VAC and we went ahead and applied this. I am not certain whether she is going to be eligible for home health but her staff are certainly going to work on it 02-24-2022 upon evaluation today patient appears to be doing excellent she is using the gauze VAC at this point and it seems to be doing a great job for her. Fortunately there does not appear to be any signs of active infection at this time which is excellent. No fever or  chills noted 03-10-2022 upon evaluation today patient appears to be doing well with regard to her wound although the wound was definitely over packed compared to what it needs to be. I think they were probably got to switch to [facility] Blue rope which I think would be much easier to put him than the current black foam which is what home health is actually using we will try to do a gauze VAC but to be honest there is not actually doing that. Fortunately there does not appear to be any signs of active infection locally or systemically at this time which is great news. I think the biggest issue I see is simply that we need to make sure and have the patient have the Hydrofera Blue rope which is again I think a much better way to go with this and avoid altogether the use of the black foam inside the wound and put a piece on the outside that is appropriate and fine. 03-17-2022 upon evaluation today patient appears to be doing well currently in regard to her wound. The wound VAC does seem to be helping the [facility] seems to be much better and very pleased in this regard. I do not see any signs of active infection locally or systemically at this time which is great news. No fevers, chills, nausea, vomiting, or diarrhea. 03-24-2022 upon evaluation today patient appears to be doing well with regard to her wound. I am seeing signs of improvement which is great news. Fortunately she is not doing any worse unfortunately  they did not have the supplies and her wound VAC on until yesterday but otherwise she is doing excellent. 03-31-2022 upon evaluation today patient appears to be doing well currently in regard to her wound. I do feel like the wound VAC is doing well we may be getting close to the point of not utilizing the wound VAC any longer the patient states that she would be happy to get to that point. Fortunately I do not see any evidence of active infection locally or systemically which is great  news. 04-07-2022 upon evaluation today patient appears to be doing well currently in regard to the wound on her right breast area. Fortunately there does not appear to be any signs of infection at this time which is great news and overall I am extremely pleased with where we stand today. No fevers, chills, nausea, vomiting, or diarrhea. 04-21-2022 upon evaluation today patient appears to be doing well currently in regard to her wound. She has been tolerating the dressing changes with the [facility] without complication and overall I am extremely pleased with where things stand today. There does not appear to be any evidence of active infection locally or systemically at this time which is great news. No fevers, chills, nausea, vomiting, or diarrhea. I do believe that she is headed in the right direction here. 05-05-2022 upon evaluation today patient actually appears to be doing excellent in regard to her wound. This is showing signs of great improvement and very pleased with where we stand. I do not see any signs of infection locally or systemically at this time which is great news. No fevers, chills, nausea, vomiting, or diarrhea. Objective Constitutional Well-nourished and well-hydrated in no acute distress. Vitals Time Taken: 3:01 PM, Height: 64 in, Weight: 180 lbs, BMI: 30.9, Temperature: 98.4 F, Pulse: 108 bpm, Respiratory Rate: 18 breaths/min, Blood Pressure: 107/73 mmHg. Respiratory normal breathing without difficulty. HUXLEY, VANWAGONER ([id number]) [id number] Page 6 of 7 Psychiatric this patient is able to make decisions and demonstrates good insight into disease process. Alert and Oriented x 3. pleasant and cooperative. General Notes: Upon inspection patient's wound bed actually showed signs of good granulation and epithelization at this point. Fortunately I do not see any evidence of worsening overall and I am extremely pleased with where things stand  currently. Integumentary (Hair, Skin) Wound #1 status is Open. Original cause of wound was Surgical Injury. The date acquired was: 08/26/2021. The wound has been in treatment 14 weeks. The wound is located on the Right Breast. The wound measures 0.4cm length x 0.6cm width x 0.8cm depth; 0.188cm^2 area and 0.151cm^3 volume. There is Fat Layer (Subcutaneous Tissue) exposed. There is no tunneling or undermining noted. There is a large amount of serosanguineous drainage noted. Foul odor after cleansing was noted. Foul odor after cleansing was noted due to product use. The wound margin is distinct with the outline attached to the wound base. There is large (67-100%) red granulation within the wound bed. There is a small (1-33%) amount of necrotic tissue within the wound bed including Adherent Slough. The periwound skin appearance had no abnormalities noted for texture. The periwound skin appearance had no abnormalities noted for moisture. The periwound skin appearance had no abnormalities noted for color. Periwound temperature was noted as No Abnormality. 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 - w/ Jeri Cos and Allayne Butcher Rm # 9 Bathing/ Shower/ Hygiene: May shower with protection but do not get wound dressing(s) wet. - May take dressing off and wash with antibacterial soap and water Negative Presssure Wound Therapy: Discontinue wound vac Home Health: [place] [facility] for wound care. The following medication(s) was  prescribed: lidocaine topical 5 % ointment ointment topical for prior to debridement was prescribed at facility WOUND #1: - Breast Wound Laterality: Right Cleanser: Normal Saline (DME) (Generic) 3 x Per Week/15 Days Discharge Instructions: Cleanse the wound with Normal Saline prior to applying a clean dressing using gauze sponges, not tissue or cotton balls. Cleanser: Wound Cleanser (Generic) 3 x Per Week/15 Days Discharge Instructions: Cleanse the wound with wound cleanser prior to applying a clean dressing using gauze sponges, not tissue or cotton balls. Peri-Wound Care: Skin Prep (Generic) 3 x Per Week/15 Days Discharge Instructions: Use skin prep as directed Prim Dressing: Endoform 2x2 in (DME) (Generic) 3 x Per Week/15 Days ary Discharge Instructions: Moisten with saline Secondary Dressing: Woven Gauze Sponge, Non-Sterile 4x4 in (Generic) 3 x Per Week/15 Days Discharge Instructions: In clinic Apply over primary dressing as directed. Secondary Dressing: Zetuvit Plus Silicone Border Dressing 4x4 (in/in) (Generic) 3 x Per Week/15 Days Discharge Instructions: Apply silicone border over primary dressing as directed. 1. I do believe based on what we are seeing that the [facility] is probably just keeping this more open now that he really needs to. I really think using something like endoform would be a much better way to go. I discussed that with the patient today and she is in agreement with the plan. We will get a switch over to the endoform and see how things do. 2. I am also can recommend that she continue to change this 3 times per week I think that is a good schedule in general and hopefully we will see this closed and improved significantly over the next week or so. We will see patient back for reevaluation in 1 week here in the clinic. If anything worsens or changes patient will contact our office for additional recommendations. Electronic Signature(s) Signed: 05/05/2022 3:37:54 PM  By: Worthy Keeler PA-C Entered By: Worthy Keeler on 05/05/2022 15:37:53 Quiroa, Lelon Frohlich ([id number]) [id number] Page 7 of 7 -------------------------------------------------------------------------------- SuperBill Details Patient Name: Date of Service: [name], [name] 05/05/2022 Medical Record Number: [id number] Patient Account Number: [id number] Date of Birth/Sex: Treating RN: [birth year] (76 y.o. 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 related to radiation D68.61 Antiphospholipid syndrome I10 Essential (primary) hypertension I25.10 Atherosclerotic heart disease of native coronary artery without angina pectoris Facility Procedures : CPT4 Code: 64680321 Description: 22482 - WOUND CARE VISIT-LEV 2 EST PT Modifier: 25 Quantity: 1 Physician Procedures : CPT4 Code Description Modifier [id number] - WC PHYS LEVEL 3 - EST PT ICD-10 Diagnosis Description T81.31XA Disruption of external operation (surgical) wound, not elsewhere classified, initial encounter L98.492 Non-pressure chronic ulcer of skin of  other  sites with fat layer exposed 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

## 2022-05-10 NOTE — Progress Notes (Signed)
[name], [name] ([id number]) [id number] Page 1 of 7 Visit Report for 05/05/2022 Arrival Information Details Patient Name: Date of Service: [name], [name] 05/05/2022 2:30 PM Medical Record Number: [id number] Patient Account Number: [id number] Date of Birth/Sex: Treating RN: 03-01-1946 (76 y.o. [name] Primary Care Morna Flud: [name] Other Clinician: Referring Joeanna Howdyshell: Treating Ulah Olmo/Extender: [name] in Treatment: 14 Visit Information History Since Last Visit Added or deleted any medications: No Patient Arrived: Ambulatory Any new allergies or adverse reactions: No Arrival Time: 15:00 Had a fall or experienced change in No Accompanied By: self activities of daily living that may affect Transfer Assistance: None risk of falls: Patient Identification Verified: Yes Signs or symptoms of abuse/neglect since last visito No Secondary Verification Process Completed: Yes Hospitalized since last visit: No Patient Requires Transmission-Based Precautions: No Implantable device outside of the clinic excluding No Patient Has Alerts: Yes cellular tissue based products placed in the center Patient Alerts: Patient on Blood Thinner since last visit: Has Dressing in Place as Prescribed: Yes Pain Present Now: Yes Electronic Signature(s) Signed: 05/10/2022 1:44:39 PM By: Sharyn Creamer RN, BSN Entered By: Sharyn Creamer on 05/05/2022 15:01:20 -------------------------------------------------------------------------------- Clinic Level of Care Assessment Details Patient Name: Date of Service: [name], [name] 05/05/2022 2:30 PM Medical Record Number: [id number] Patient Account Number: [id number] Date of Birth/Sex: Treating RN: 1946-01-25 (76 y.o. [name] Primary Care Nava Song: [name] Other Clinician: Referring Kabrina Christiano: Treating Keilana Morlock/Extender: [name] in  Treatment: 14 Clinic Level of Care Assessment Items TOOL 4 Quantity Score X- 1 0 Use when only an EandM is performed on FOLLOW-UP visit ASSESSMENTS - Nursing Assessment / Reassessment X- 1 10 Reassessment of Co-morbidities (includes updates in patient status) X- 1 5 Reassessment of Adherence to Treatment Plan ASSESSMENTS - Wound and Skin A ssessment / Reassessment X - Simple Wound Assessment / Reassessment - one wound 1 5 _0  - 0 Complex Wound Assessment / Reassessment - multiple wounds _1  - 0 Dermatologic / Skin Assessment (not related to wound area) ASSESSMENTS - Focused Assessment _2  - 0 Circumferential Edema Measurements - multi extremities _3  - 0 Nutritional Assessment / Counseling / Intervention ALAYHA, BABINEAUX ([id number]) [id number] Page 2 of 7 _4  - 0 Lower Extremity Assessment (monofilament, tuning fork, pulses) _5  - 0 Peripheral Arterial Disease Assessment (using hand held doppler) ASSESSMENTS - Ostomy and/or Continence Assessment and Care _6  - 0 Incontinence Assessment and Management _7  - 0 Ostomy Care Assessment and Management (repouching, etc.) PROCESS - Coordination of Care X - Simple Patient / Family Education for ongoing care 1 15 _8  - 0 Complex (extensive) Patient / Family Education for ongoing care X- 1 10 Staff obtains Programmer, systems, Records, T Results / Process Orders est _9  - 0 Staff telephones HHA, Nursing Homes / Clarify orders / etc _10  - 0 Routine Transfer to another Facility (non-emergent condition) _11  - 0 Routine Hospital Admission (non-emergent condition) _12  - 0 New Admissions / Biomedical engineer / Ordering NPWT Apligraf, etc. , _13  - 0 Emergency Hospital Admission (emergent condition) _14  - 0 Simple Discharge Coordination _15  - 0 Complex (extensive) Discharge Coordination PROCESS - Special Needs _16  - 0 Pediatric / Minor Patient Management _17  - 0 Isolation Patient Management _18  - 0 Hearing / Language /  Visual special needs _19  - 0 Assessment of Community assistance (transportation, D/C planning, etc.) _20  - 0 Additional assistance / Altered mentation _21  - 0 Support Surface(s) Assessment (bed, cushion, seat, etc.) INTERVENTIONS - Wound Cleansing /  Measurement X - Simple Wound Cleansing - one wound 1 5 _0  - 0 Complex Wound Cleansing - multiple wounds X- 1 5 Wound Imaging (photographs - any number of wounds) _1  - 0 Wound Tracing (instead of photographs) X- 1 5 Simple Wound Measurement - one wound _2  - 0 Complex Wound Measurement - multiple wounds INTERVENTIONS - Wound Dressings X - Small Wound Dressing one or multiple wounds 1 10 _3  - 0 Medium Wound Dressing one or multiple wounds _4  - 0 Large Wound Dressing one or multiple wounds <ELFYBOFBPZWCHENI>_7<\/POEUMPNTIRWERXVQ>_0  - 0 Application of Medications - topical <GQQPYPPJKDTOIZTI>_4<\/PYKDXIPJASNKNLZJ>_6  - 0 Application of Medications - injection INTERVENTIONS - Miscellaneous _7  - 0 External ear exam _8  - 0 Specimen Collection (cultures, biopsies, blood, body fluids, etc.) _9  - 0 Specimen(s) / Culture(s) sent or taken to Lab for analysis _10  - 0 Patient Transfer (multiple staff / Civil Service fast streamer / Similar devices) _11  - 0 Simple Staple / Suture removal (25 or less) _12  - 0 Complex Staple / Suture removal (26 or more) _13  - 0 Hypo / Hyperglycemic Management (close monitor of Blood Glucose) [name], [name] ([id number]) [id number] Page 3 of 7 _14  - 0 Ankle / Brachial Index (ABI) - do not check if billed separately X- 1 5 Vital Signs Has the patient been seen at the hospital within the last three years: Yes Total Score: 75 Level Of Care: New/Established - Level 2 Electronic Signature(s) Signed: 05/10/2022 1:44:39 PM By: Sharyn Creamer RN, BSN Entered By: Sharyn Creamer on 05/05/2022 16:04:04 -------------------------------------------------------------------------------- Encounter Discharge Information Details Patient Name: Date of Service: [name] 05/05/2022 2:30  PM Medical Record Number: [id number] Patient Account Number: [id number] Date of Birth/Sex: Treating RN: 06-Nov-1945 (76 y.o. [name] Primary Care Johnnetta Holstine: [name] Other Clinician: Referring Katelyn Kohlmeyer: Treating Kyden Potash/Extender: [name] in Treatment: 14 Encounter Discharge Information Items Discharge Condition: Stable Ambulatory Status: Ambulatory Discharge Destination: Home Transportation: Private Auto Accompanied By: self Schedule Follow-up Appointment: Yes Clinical Summary of Care: Patient Declined Electronic Signature(s) Signed: 05/10/2022 1:44:39 PM By: Sharyn Creamer RN, BSN Entered By: Sharyn Creamer on 05/05/2022 16:09:41 -------------------------------------------------------------------------------- Lower Extremity Assessment Details Patient Name: Date of Service: [name] 05/05/2022 2:30 PM Medical Record Number: [id number] Patient Account Number: [id number] Date of Birth/Sex: Treating RN: November 17, 1945 (76 y.o. [name] Primary Care Yulanda Diggs: [name] Other Clinician: Referring Shondale Quinley: Treating Heston Widener/Extender: [name] in Treatment: 14 Electronic Signature(s) Signed: 05/10/2022 1:44:39 PM By: Sharyn Creamer RN, BSN Entered By: Sharyn Creamer on 05/05/2022 15:02:31 -------------------------------------------------------------------------------- Multi-Disciplinary Care Plan Details Patient Name: Date of Service: [name] 05/05/2022 2:30 PM Medical Record Number: [id number] Patient Account Number: [id number] [name], [name] ([id number]) [phone].pdf Page 4 of 7 Date of Birth/Sex: Treating RN: [birth year] (76 y.o. [name] Primary Care Diksha Tagliaferro: Other Clinician: [name] Referring Bernadett Milian: Treating Hakop Humbarger/Extender: [name] in Treatment: 14 Active Inactive Wound/Skin  Impairment Nursing Diagnoses: Impaired tissue integrity Knowledge deficit related to ulceration/compromised skin integrity Goals: Patient will have a decrease in wound volume by X% from date: (specify in notes) Date Initiated: 01/27/2022 Target Resolution Date: 05/20/2022 Goal Status: Active Patient/caregiver will verbalize understanding of skin care regimen Date Initiated: 01/27/2022 Date Inactivated: 05/05/2022 Target Resolution Date: 04/23/2022 Goal Status: Met Ulcer/skin breakdown will have a volume reduction of 30% by week 4 Date Initiated: 01/27/2022 Target Resolution Date: 05/20/2022 Goal Status: Active Interventions: Assess patient/caregiver ability to obtain necessary supplies Assess patient/caregiver ability to perform ulcer/skin care  regimen upon admission and as needed Assess ulceration(s) every visit Notes: Electronic Signature(s) Signed: 05/10/2022 1:44:39 PM By: Sharyn Creamer RN, BSN Entered By: Sharyn Creamer on 05/05/2022 15:10:05 -------------------------------------------------------------------------------- Pain Assessment Details Patient Name: Date of Service: [name] 05/05/2022 2:30 PM Medical Record Number: [id number] Patient Account Number: [id number] Date of Birth/Sex: Treating RN: 1946-04-20 (76 y.o. [name] Primary Care Versa Craton: [name] Other Clinician: Referring Kaydince Towles: Treating Taje Tondreau/Extender: [name] in Treatment: 14 Active Problems Location of Pain Severity and Description of Pain Patient Has Paino Yes Site Locations Rate the pain. [name], [name] ([id number]) [id number] Page 5 of 7 Rate the pain. Current Pain Level: 4 Pain Management and Medication Current Pain Management: Electronic Signature(s) Signed: 05/10/2022 1:44:39 PM By: Sharyn Creamer RN, BSN Entered By: Sharyn Creamer on 05/05/2022  15:02:25 -------------------------------------------------------------------------------- Patient/Caregiver Education Details Patient Name: Date of Service: [name] 10/11/2023andnbsp2:30 PM Medical Record Number: [id number] Patient Account Number: [id number] Date of Birth/Gender: Treating RN: 20-Dec-1945 (76 y.o. [name] Primary Care Physician: [name] Other Clinician: Referring Physician: Treating Physician/Extender: [name] in Treatment: 14 Education Assessment Education Provided To: Patient Education Topics Provided Wound/Skin Impairment: Methods: Explain/Verbal Responses: State content correctly [employer]) Signed: 05/10/2022 1:44:39 PM By: Sharyn Creamer RN, BSN Entered By: Sharyn Creamer on 05/05/2022 15:10:19 -------------------------------------------------------------------------------- Wound Assessment Details Patient Name: Date of Service: [name] 05/05/2022 2:30 PM Medical Record Number: [id number] Patient Account Number: [id number] Date of Birth/Sex: Treating RN: 1945-09-28 (76 y.o. [name] Primary Care Karuna Balducci: [name] Other Clinician: Referring [name]: Treating Deaira Leckey/Extender: [name], [name] ([id number]) [id number] Page 6 of 7 Weeks in Treatment: 14 Wound Status Wound Number: 1 Primary Open Surgical Wound Etiology: Wound Location: Right Breast Secondary Calciphylaxis Wounding Event: Surgical Injury Etiology: Date Acquired: 08/26/2021 Wound Open Weeks Of Treatment: 14 Status: Clustered Wound: No Comorbid Asthma, Deep Vein Thrombosis, Hypertension, Peripheral Arterial History: Disease, Hepatitis B, Osteoarthritis, Neuropathy, Received Radiation Photos Wound Measurements Length: (cm) 0.4 Width: (cm) 0.6 Depth: (cm) 0.8 Area: (cm) 0.188 Volume: (cm) 0.151 % Reduction in Area: -164.8% %  Reduction in Volume: -112.7% Epithelialization: Small (1-33%) Tunneling: No Undermining: No Wound Description Classification: Full Thickness With Exposed Suppo Wound Margin: Distinct, outline attached Exudate Amount: Large Exudate Type: Serosanguineous Exudate Color: red, brown rt Structures Foul Odor After Cleansing: Yes Due to Product Use: Yes Slough/Fibrino Yes Wound Bed Granulation Amount: Large (67-100%) Exposed Structure Granulation Quality: Red Fascia Exposed: No Necrotic Amount: Small (1-33%) Fat Layer (Subcutaneous Tissue) Exposed: Yes Necrotic Quality: Adherent Slough Tendon Exposed: No Muscle Exposed: No Joint Exposed: No Bone Exposed: No Periwound Skin Texture Texture Color No Abnormalities Noted: Yes No Abnormalities Noted: Yes Moisture Temperature / Pain No Abnormalities Noted: Yes Temperature: No Abnormality Treatment Notes Wound #1 (Breast) Wound Laterality: Right Cleanser Normal Saline Discharge Instruction: Cleanse the wound with Normal Saline prior to applying a clean dressing using gauze sponges, not tissue or cotton balls. Wound Cleanser Discharge Instruction: Cleanse the wound with wound cleanser prior to applying a clean dressing using gauze sponges, not tissue or cotton balls. Peri-Wound Care Skin Prep Discharge Instruction: Use skin prep as directed Topical [name], [name] ([id number]) [id number] Page 7 of 7 Primary Dressing Endoform 2x2 in Discharge Instruction: Moisten with saline Secondary Dressing Woven Gauze Sponge, Non-Sterile 4x4 in Discharge Instruction: In clinic Apply over primary dressing as directed. Zetuvit Plus Silicone Border Dressing 4x4 (in/in) Discharge Instruction: Apply silicone border  over primary dressing as directed. Secured With Compression Wrap Compression Stockings Environmental education officer) Signed: 05/10/2022 1:44:39 PM By: Sharyn Creamer RN, BSN Entered By: Sharyn Creamer  on 05/05/2022 15:08:39 -------------------------------------------------------------------------------- [place] Details Patient Name: Date of Service: [name]. 05/05/2022 2:30 PM Medical Record Number: [id number] Patient Account Number: [id number] Date of Birth/Sex: Treating RN: 05-27-1946 (76 y.o. [name] Primary Care Va Broadwell: [name] Other Clinician: Referring Ellanora Rayborn: Treating Aliceson Dolbow/Extender: [name] in Treatment: 14 Vital Signs Time Taken: 15:01 Temperature (F): 98.4 Height (in): 64 Pulse (bpm): 108 Weight (lbs): 180 Respiratory Rate (breaths/min): 18 Body Mass Index (BMI): 30.9 Blood Pressure (mmHg): 107/73 Reference Range: 80 - 120 mg / dl Electronic Signature(s) Signed: 05/10/2022 1:44:39 PM By: Sharyn Creamer RN, BSN Entered By: Sharyn Creamer on 05/05/2022 15:01:48

## 2022-05-11 ENCOUNTER — Encounter (HOSPITAL_COMMUNITY): Payer: Self-pay

## 2022-05-11 ENCOUNTER — Emergency Department (HOSPITAL_COMMUNITY): Payer: Medicare HMO

## 2022-05-11 ENCOUNTER — Other Ambulatory Visit: Payer: Self-pay

## 2022-05-11 ENCOUNTER — Emergency Department (HOSPITAL_COMMUNITY)
Admission: EM | Admit: 2022-05-11 | Discharge: 2022-05-11 | Disposition: A | Discharge status: Home / Self Care | Payer: Medicare HMO | Attending: Emergency Medicine | Admitting: Emergency Medicine

## 2022-05-11 DIAGNOSIS — Z79899 Other long term (current) drug therapy: Secondary | ICD-10-CM | POA: Diagnosis not present

## 2022-05-11 DIAGNOSIS — Z7982 Long term (current) use of aspirin: Secondary | ICD-10-CM | POA: Diagnosis not present

## 2022-05-11 DIAGNOSIS — J45909 Unspecified asthma, uncomplicated: Secondary | ICD-10-CM | POA: Diagnosis not present

## 2022-05-11 DIAGNOSIS — R0789 Other chest pain: Secondary | ICD-10-CM | POA: Insufficient documentation

## 2022-05-11 DIAGNOSIS — Z7901 Long term (current) use of anticoagulants: Secondary | ICD-10-CM | POA: Insufficient documentation

## 2022-05-11 DIAGNOSIS — I10 Essential (primary) hypertension: Secondary | ICD-10-CM | POA: Diagnosis not present

## 2022-05-11 DIAGNOSIS — Z853 Personal history of malignant neoplasm of breast: Secondary | ICD-10-CM | POA: Diagnosis not present

## 2022-05-11 DIAGNOSIS — E039 Hypothyroidism, unspecified: Secondary | ICD-10-CM | POA: Diagnosis not present

## 2022-05-11 DIAGNOSIS — R079 Chest pain, unspecified: Secondary | ICD-10-CM

## 2022-05-11 LAB — CBC
HCT: 39.3 % (ref 36.0–46.0)
Hemoglobin: 12.6 g/dL (ref 12.0–15.0)
MCH: 30.4 pg (ref 26.0–34.0)
MCHC: 32.1 g/dL (ref 30.0–36.0)
MCV: 94.9 fL (ref 80.0–100.0)
Platelets: 266 10*3/uL (ref 150–400)
RBC: 4.14 MIL/uL (ref 3.87–5.11)
RDW: 14.6 % (ref 11.5–15.5)
WBC: 9.1 10*3/uL (ref 4.0–10.5)
nRBC: 0 % (ref 0.0–0.2)

## 2022-05-11 LAB — BASIC METABOLIC PANEL
Anion gap: 12 (ref 5–15)
BUN: 17 mg/dL (ref 8–23)
CO2: 23 mmol/L (ref 22–32)
Calcium: 8.8 mg/dL — ABNORMAL LOW (ref 8.9–10.3)
Chloride: 104 mmol/L (ref 98–111)
Creatinine, Ser: 1.15 mg/dL — ABNORMAL HIGH (ref 0.44–1.00)
GFR, Estimated: 50 mL/min — ABNORMAL LOW (ref >60)
Glucose, Bld: 145 mg/dL — ABNORMAL HIGH (ref 70–99)
Potassium: 4.4 mmol/L (ref 3.5–5.1)
Sodium: 139 mmol/L (ref 135–145)

## 2022-05-11 LAB — TROPONIN I (HIGH SENSITIVITY)
Troponin I (High Sensitivity): 11 ng/L (ref <18)
Troponin I (High Sensitivity): 5 ng/L (ref <18)

## 2022-05-11 MED ORDER — ASPIRIN 325 MG PO TABS
325.0000 mg | ORAL_TABLET | Freq: Every day | ORAL | Status: DC
  Administered 2022-05-11: 325 mg via ORAL
  Filled 2022-05-11: qty 1

## 2022-05-11 MED ORDER — ACETAMINOPHEN 500 MG PO TABS: 1000.0000 mg | ORAL_TABLET | ORAL | Status: DC

## 2022-05-11 MED ORDER — LIDOCAINE 5 % EX PTCH: 1.0000 | MEDICATED_PATCH | CUTANEOUS | Status: DC

## 2022-05-11 NOTE — ED Triage Notes (Signed)
Pt arrived POV after visiting the dentist this morning when she started having a headache and left sided chest pain that radiated into her left arm and shoulder.

## 2022-05-11 NOTE — ED Provider Triage Note (Addendum)
Emergency Medicine Provider Triage Evaluation Note  [name] , a 76 y.o. female  was evaluated in triage.  Pt complains of chest pain x 1 hour. Radiating to left arm, left neck, left shoulder, and left upper back. 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 Pain came on all of a sudden, was just driving in the car. Hasn't been feeling well the past several days with some fluttering discomfort. Takes warfarin, last had INR checked yesterday and was fine. Sees Cardiology with [facility]  Review of Systems  Positive: CP, headache, intermittent SOB Negative: Syncope, palpitations  Physical Exam  BP 139/73   Pulse 76   Temp 98.1 F (36.7 C) (Oral)   Resp (!) 22   Ht '5\' 5"'$  (1.651 m)   Wt 85.3 kg   SpO2 97%   BMI 31.28 kg/m  Gen:   Awake, no distress   Resp:  Normal effort  MSK:   Moves extremities without difficulty  Other:    Medical Decision Making  Medically screening exam initiated at 12:38 PM.  Appropriate orders placed.  [name] was informed that the remainder of the evaluation will be completed by another provider, this initial triage assessment does not replace that evaluation, and the importance of remaining in the ED until their evaluation is complete.  Workup initiated, aspirin given in triage. Right arm restricted   [name] T, PA-C 05/11/22 1245

## 2022-05-11 NOTE — ED Provider Notes (Signed)
[facility] EMERGENCY DEPARTMENT Provider Note   CSN: [id number] Arrival date & time: 05/11/22  1211     History  Chief Complaint  Patient presents with   Chest Pain    [name] is a 76 y.o. female.  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  Patient states that today after leaving the dentist office she started experiencing left-sided chest pain that she describes as a pressure.  Says that it radiates up her neck and down her left arm.  Says that there is no numbness or weakness of her left arm.  No neck pain otherwise.  Says that the headache has resolved but still has mild discomfort in her left chest.  Denies any injuries to the area.  No diaphoresis, vomiting, or other radiation of the pain including to her back.  Did have brief episode of shortness of breath with it but is not short of breath currently.  Says she has been compliant with her warfarin and INR has been within range recently.  No lower extremity swelling or pain.  No history of PCI/CABG.  Initially went to urgent care but was referred into the emergency department.    Past Medical History:  Diagnosis Date   Anxiety    severe panic attacks   Arthritis    Aseptic meningitis    Asthma    as child   Breast cancer ([place])    right   Chronic anticoagulation 08/04/2015   Collapsed lung    hx of, left; following left thoracentesis for post-operative pleural effusion > 20 years ago   Complication of anesthesia    "hard to put asleep"; awareness under anesthesia > 35 years ago for hysterectomy   Depression    Fibromyalgia    GERD (gastroesophageal reflux disease)    H/O hiatal hernia    Headache(784.0)    Hepatitis 1990   "from eating at restaurant"   History of antiphospholipid antibody syndrome    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    Hx-TIA (transient ischemic attack)    Hyperlipidemia    Hypertension    Hypothyroidism    Neuromuscular disorder ([place])    Personal history of radiation therapy    Pneumonia    hx of   Soft tissue mass 06/14/2017   3 cm right post axilla same side as previous breast cancer 06/14/17   Stroke [facility])    Thrombosis, 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 Authorizing Provider  ALPRAZolam [name]) 1 MG tablet Take 1 mg by mouth 2 (two) times daily.  12/22/19   [provider]  Ascorbic Acid (VITAMIN C) 1000 MG tablet Take 1,000 mg by mouth daily.    [provider]  aspirin 81 MG tablet Take 81 mg by mouth every evening.    [provider]  benzocaine-menthol (CHLORAEPTIC) 6-10 MG lozenge Take 1 lozenge by mouth as needed for sore throat. 02/16/22   Jeanell Sparrow, DO  Calcium Carb-Cholecalciferol (CALCIUM-VITAMIN D) 600-400 MG-UNIT TABS Take 1 tablet by mouth daily.    [provider]  Cyanocobalamin (VITAMIN B 12) 500 MCG TABS Take 500 mcg by mouth daily.  [provider]  EPINEPHrine 0.3 mg/0.3 mL IJ SOAJ injection Inject 0.3 mg into the muscle as needed for anaphylaxis. 02/16/22   Jeanell Sparrow, DO  fluticasone (FLONASE) 50 MCG/ACT nasal spray Place 2 sprays into the nose daily as needed for allergies.  10/16/14   [provider]  HYDROcodone-acetaminophen (NORCO/VICODIN) 5-325 MG tablet Take 1 tablet by mouth every 6 (six) hours as needed for moderate pain. 09/15/21   Cornett, Marcello Moores, MD  levothyroxine (SYNTHROID, LEVOTHROID) 112 MCG tablet Take 112 mcg by mouth See admin instructions. Take 112 mcg by mouth daily on Sun, Tues, Wed, Thurs, Sat    [provider]  levothyroxine (SYNTHROID, LEVOTHROID) 125 MCG tablet Take 125 mcg by mouth See admin  instructions. Take 125 mcg by mouth on Monday and Friday 06/24/16   [provider]  lidocaine (XYLOCAINE) 2 % solution Use as directed 15 mLs in the mouth or throat as needed for mouth pain. 02/16/22   Wynona Dove A, DO  magnesium oxide (MAG-OX) 400 MG tablet Take 400 mg by mouth daily.    [provider]  metoprolol tartrate (LOPRESSOR) 25 MG tablet Take 12.5 mg by mouth 2 (two) times daily.    [provider]  omeprazole (PRILOSEC) 20 MG capsule Take 20 mg by mouth daily.  02/06/13   [provider]  pravastatin (PRAVACHOL) 80 MG tablet Take 80 mg by mouth daily.    [provider]  sertraline (ZOLOFT) 100 MG tablet Take 100 mg by mouth daily. 01/20/20   [provider]  warfarin (COUMADIN) 5 MG tablet Take 5 mg daily except on Mondays: take 2.5 mg (1/2 tablet) 12/29/21   Nicholas Lose, MD      Allergies    Bee venom, Hornet venom, Reglan [metoclopramide], Vitamin k, Baclofen, Cephalexin, Benadryl [diphenhydramine hcl], Ciprofloxacin, Crestor [rosuvastatin calcium], Cymbalta [duloxetine hcl], Daypro [oxaprozin], Doxycycline, Levofloxacin, Sulfonamide derivatives, and Zocor [simvastatin]    Review of Systems   Review of Systems  Physical Exam Updated Vital Signs BP 122/79   Pulse 76   Temp 97.9 F (36.6 C) (Oral)   Resp 15   Ht '5\' 5"'$  (1.651 m)   Wt 85.3 kg   SpO2 100%   BMI 31.28 kg/m  Physical Exam Vitals and nursing note reviewed.  Constitutional:      General: She is not in acute distress.    Appearance: She is well-developed.  HENT:     Head: Normocephalic and atraumatic.     Right Ear: External ear normal.     Left Ear: External ear normal.     Nose: Nose normal.  Eyes:     Extraocular Movements: Extraocular movements intact.     Conjunctiva/sclera: Conjunctivae normal.     Pupils: Pupils are equal, round, and reactive to light.  Cardiovascular:     Rate and Rhythm: Normal rate and regular rhythm.     Heart sounds:  No murmur heard. Pulmonary:     Effort: Pulmonary effort is normal. No respiratory distress.     Breath sounds: Normal breath sounds.  Abdominal:     General: Abdomen is flat. There is no distension.     Palpations: Abdomen is soft. There is no mass.     Tenderness: There is no abdominal tenderness. There is no guarding.  Musculoskeletal:        General: No swelling.     Cervical back: Normal range of motion and neck supple.     Right lower leg: No edema.  Left lower leg: No edema.  Skin:    General: Skin is warm and dry.     Capillary Refill: Capillary refill takes less than 2 seconds.  Neurological:     Mental Status: She is alert and oriented to person, place, and time. Mental status is at baseline.  Psychiatric:        Mood and Affect: Mood normal.     ED Results / Procedures / Treatments   Labs (all labs ordered are listed, but only abnormal results are displayed) Labs Reviewed  BASIC METABOLIC PANEL - Abnormal; Notable for the following components:      Result Value   Glucose, Bld 145 (*)    Creatinine, Ser 1.15 (*)    Calcium 8.8 (*)    GFR, Estimated 50 (*)    All other components within normal limits  CBC  TROPONIN I (HIGH SENSITIVITY)  TROPONIN I (HIGH SENSITIVITY)    EKG EKG Interpretation  Date/Time:  Tuesday May 11 2022 12:35:27 EDT Ventricular Rate:  73 PR Interval:  192 QRS Duration: 72 QT Interval:  394 QTC Calculation: 434 R Axis:   23 Text Interpretation: Normal sinus rhythm Normal ECG When compared with ECG of 13-Apr-2016 12:34,  no significant changes found Confirmed by Margaretmary Eddy [phone]) on 05/11/2022 7:10:29 PM  Radiology DG Chest 2 View  Result Date: 05/11/2022 CLINICAL DATA:  Chest and left arm pain. EXAM: CHEST - 2 VIEW COMPARISON:  Chest x-ray 06/14/2021 FINDINGS: The cardiac silhouette, mediastinal and hilar contours are within normal limits and stable. Stable mild eventration of the right hemidiaphragm with slight  overlying vascular crowding and atelectasis. No infiltrates or effusions. No pneumothorax. The bony thorax is intact. Remote healed lower left rib fracture. IMPRESSION: No acute cardiopulmonary findings. Electronically Signed   By: Marijo Sanes M.D.   On: 05/11/2022 13:10    Procedures Procedures   Medications Ordered in ED Medications - No data to display   ED Course/ Medical Decision Making/ A&P                           Medical Decision Making Risk OTC drugs. Prescription drug management.   Joice Nazario is a 76 y.o. 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   This patient presents to the ED for concern of complaints listed in HPI, this involves an extensive number of treatment options, and is a complaint that carries with it a high risk of complications and morbidity.   Initial Ddx:  MI, PE, pneumothorax, pneumonia, dissection  MDM:  Feel that patient who potentially be having an MI given the location of her pain and description.  Considered pulmonary embolism but she was therapeutic and her INR several days ago and is not having any pulmonary complaints at this time aside from spontaneously resolving shortness of breath.  No infectious symptoms to suggest pneumonia.  Pulses are symmetric and description of pain is not consistent with dissection and additionally she does not have any neurologic complaints at this time.  Plan:  Labs Serial troponin EKG Chest x-ray  ED Summary:  Patient underwent the above work-up which showed stable serial troponins that were WNL.  EKG did not show any new ischemic changes.  Chest x-ray showed normal mediastinum and no evidence of infiltrate or pneumothorax.  Patient was feeling much better after she was  given 324 of aspirin.  Feel that life-threatening diagnoses are highly  unlikely at this time.  Informed the patient that she will need to follow-up with her cardiologist in several days along with her primary care doctor for additional evaluation of her chest pain.  Dispo: DC Home. Return precautions discussed including, but not limited to, those listed in the AVS. Allowed pt time to ask questions which were answered fully prior to dc.   Additional history obtained from family Records reviewed Care Everywhere The following labs were independently interpreted: Serial Troponins and show no acute abnormality I independently reviewed the following imaging with scope of interpretation limited to determining acute life threatening conditions related to emergency care: Chest x-ray, which revealed no acute abnormality  I personally reviewed and interpreted the pt's EKG: see above for interpretation  I have reviewed the patients home medications and made adjustments as needed  Final Clinical Impression(s) / ED Diagnoses Final diagnoses:  Chest pain, unspecified type    Rx / DC Orders ED Discharge Orders          Ordered    Ambulatory referral to Cardiology        05/11/22 1943              Fransico Meadow, MD 05/11/22 2000

## 2022-05-11 NOTE — Discharge Instructions (Addendum)
Today you were seen in the emergency department for your chest pain.    In the emergency department you had EKG, chest x-ray, and lab work that was reassuring.    At home, please take Tylenol and use lidocaine patches we have prescribed you for any pain that you may have.    Follow-up with your primary doctor in 2-3 days regarding your visit.  We have placed a referral for cardiology to contact to regarding additional testing for your chest pain.  If they do not call you within 72 hours you may use the number included in this packet to set up an appointment  Return immediately to the emergency department if you experience any of the following: Worsening pain, sweating, vomiting, or any other concerning symptoms.    Thank you for visiting our Emergency Department. It was a pleasure taking care of you today.

## 2022-05-12 ENCOUNTER — Encounter (HOSPITAL_BASED_OUTPATIENT_CLINIC_OR_DEPARTMENT_OTHER): Payer: Medicare HMO | Admitting: Internal Medicine

## 2022-05-12 DIAGNOSIS — T8131XA Disruption of external operation (surgical) wound, not elsewhere classified, initial encounter: Secondary | ICD-10-CM | POA: Diagnosis not present

## 2022-05-13 NOTE — Progress Notes (Signed)
[name], [name] ([id number]) [phone].pdf Page 1 of 9 Visit Report for 05/12/2022 Arrival Information Details Patient Name: Date of Service: [name], [name] 05/12/2022 2:30 PM Medical Record Number: [id number] Patient Account Number: [id number] Date of Birth/Sex: Treating RN: [birth year] (76 y.o. F) Primary Care [name]: [name] Other Clinician: Referring [name]: Treating [name]: [name] in Treatment: 15 Visit Information History Since Last Visit Added or deleted any medications: No Patient Arrived: Ambulatory Any new allergies or adverse reactions: No Arrival Time: 14:19 Had a fall or experienced change in No Accompanied By: self activities of daily living that may affect Transfer Assistance: None risk of falls: Patient Requires Transmission-Based Precautions: No Signs or symptoms of abuse/neglect since last visito No Patient Has Alerts: Yes Hospitalized since last visit: No Patient Alerts: Patient on Blood Thinner Implantable device outside of the clinic excluding No cellular tissue based products placed in the center since last visit: Has Dressing in Place as Prescribed: Yes Pain Present Now: No Electronic Signature(s) Signed: 05/12/2022 4:47:05 PM By: [name] Entered By: [name] on 05/12/2022 14:20:57 -------------------------------------------------------------------------------- Clinic Level of Care Assessment Details Patient Name: Date of Service: [name], [name] 05/12/2022 2:30 PM Medical Record Number: [id number] Patient Account Number: [id number] Date of Birth/Sex: Treating RN: 03/26/1946 (76 y.o. F) Primary Care [name]: [name] Other Clinician: Referring [name]: Treating [name]/Extender: [name] in Treatment: 15 Clinic Level of Care Assessment Items TOOL 4 Quantity Score X- 1 0 Use when only an EandM is performed on  FOLLOW-UP visit ASSESSMENTS - Nursing Assessment / Reassessment X- 1 10 Reassessment of Co-morbidities (includes updates in patient status) X- 1 5 Reassessment of Adherence to Treatment Plan ASSESSMENTS - Wound and Skin A ssessment / Reassessment X - Simple Wound Assessment / Reassessment - one wound 1 5 '[]'  - 0 Complex Wound Assessment / Reassessment - multiple wounds '[]'  - 0 Dermatologic / Skin Assessment (not related to wound area) ASSESSMENTS - Focused Assessment '[]'  - 0 Circumferential Edema Measurements - multi extremities '[]'  - 0 Nutritional Assessment / Counseling / Intervention [name], [name] ([id number]) [phone].pdf Page 2 of 9 '[]'  - 0 Lower Extremity Assessment (monofilament, tuning fork, pulses) '[]'  - 0 Peripheral Arterial Disease Assessment (using hand held doppler) ASSESSMENTS - Ostomy and/or Continence Assessment and Care '[]'  - 0 Incontinence Assessment and Management '[]'  - 0 Ostomy Care Assessment and Management (repouching, etc.) PROCESS - Coordination of Care X - Simple Patient / Family Education for ongoing care 1 15 '[]'  - 0 Complex (extensive) Patient / Family Education for ongoing care X- 1 10 Staff obtains Programmer, systems, Records, T Results / Process Orders est '[]'  - 0 Staff telephones HHA, Nursing Homes / Clarify orders / etc '[]'  - 0 Routine Transfer to another Facility (non-emergent condition) '[]'  - 0 Routine Hospital Admission (non-emergent condition) '[]'  - 0 New Admissions / Biomedical engineer / Ordering NPWT Apligraf, etc. , '[]'  - 0 Emergency Hospital Admission (emergent condition) X- 1 10 Simple Discharge Coordination '[]'  - 0 Complex (extensive) Discharge Coordination PROCESS - Special Needs '[]'  - 0 Pediatric / Minor Patient Management '[]'  - 0 Isolation Patient Management '[]'  - 0 Hearing / Language / Visual special needs '[]'  - 0 Assessment of Community assistance (transportation, D/C planning, etc.) '[]'  - 0 Additional  assistance / Altered mentation '[]'  - 0 Support Surface(s) Assessment (bed, cushion, seat, etc.) INTERVENTIONS - Wound Cleansing / Measurement X - Simple Wound Cleansing - one wound 1 5 '[]'  - 0 Complex Wound Cleansing -  multiple wounds X- 1 5 Wound Imaging (photographs - any number of wounds) '[]'  - 0 Wound Tracing (instead of photographs) X- 1 5 Simple Wound Measurement - one wound '[]'  - 0 Complex Wound Measurement - multiple wounds INTERVENTIONS - Wound Dressings X - Small Wound Dressing one or multiple wounds 1 10 '[]'  - 0 Medium Wound Dressing one or multiple wounds '[]'  - 0 Large Wound Dressing one or multiple wounds X- 1 5 Application of Medications - topical '[]'  - 0 Application of Medications - injection INTERVENTIONS - Miscellaneous '[]'  - 0 External ear exam '[]'  - 0 Specimen Collection (cultures, biopsies, blood, body fluids, etc.) '[]'  - 0 Specimen(s) / Culture(s) sent or taken to Lab for analysis '[]'  - 0 Patient Transfer (multiple staff / Civil Service fast streamer / Similar devices) '[]'  - 0 Simple Staple / Suture removal (25 or less) '[]'  - 0 Complex Staple / Suture removal (26 or more) '[]'  - 0 Hypo / Hyperglycemic Management (close monitor of Blood Glucose) [name] ([id number]) [id number] Page 3 of 9 '[]'  - 0 Ankle / Brachial Index (ABI) - do not check if billed separately X- 1 5 Vital Signs Has the patient been seen at the hospital within the last three years: Yes Total Score: 90 Level Of Care: New/Established - Level 3 Electronic Signature(s) Signed: 05/12/2022 4:47:05 PM By: [name] Entered By: [name] on 05/12/2022 14:50:51 -------------------------------------------------------------------------------- Encounter Discharge Information Details Patient Name: Date of Service: [name] 05/12/2022 2:30 PM Medical Record Number: [id number] Patient Account Number: [id number] Date of Birth/Sex: Treating RN: [birth year] (76 y.o.  F) Primary Care Dartagnan Beavers: [name] Other Clinician: Referring Jennyfer Nickolson: Treating [name]/Extender: [name] in Treatment: 15 Encounter Discharge Information Items Discharge Condition: Stable Ambulatory Status: Ambulatory Discharge Destination: Home Transportation: Private Auto Accompanied By: self Schedule Follow-up Appointment: Yes Clinical Summary of Care: Patient Declined Electronic Signature(s) Signed: 05/12/2022 4:47:05 PM By: [name] Entered By: [name] on 05/12/2022 14:51:32 -------------------------------------------------------------------------------- Lower Extremity Assessment Details Patient Name: Date of Service: [name], [name] 05/12/2022 2:30 PM Medical Record Number: [id number] Patient Account Number: [id number] Date of Birth/Sex: Treating RN: 1946-06-10 (76 y.o. [name], [name] Primary Care Kaliann Coryell: [name] Other Clinician: Referring Jaquail Mclees: Treating Graciano Batson/Extender: [name] in Treatment: 15 Electronic Signature(s) Signed: 05/12/2022 4:47:05 PM By: [name] Signed: 05/13/2022 4:05:37 PM By: Rhae Hammock RN Entered By: [name] on 05/12/2022 14:24:07 -------------------------------------------------------------------------------- Multi Wound Chart Details Patient Name: Date of Service: [name]. 05/12/2022 2:30 PM Meter, Lelon Frohlich ([id number]) [id number] Page 4 of 9 Medical Record Number: [id number] Patient Account Number: [id number] Date of Birth/Sex: Treating RN: [birth year] (76 y.o. F) Primary Care Danicia Terhaar: [name] Other Clinician: Referring Johnta Couts: Treating Marquez Ceesay/Extender: [name] in Treatment: 15 Vital Signs Height(in): 64 Pulse(bpm): 68 Weight(lbs): 180 Blood Pressure(mmHg): 123/87 Body Mass Index(BMI): 30.9 Temperature(F): 97.7 Respiratory  Rate(breaths/min): 18 [1:Photos:] [N/A:N/A] Right Breast N/A N/A Wound Location: Surgical Injury N/A N/A Wounding Event: Open Surgical Wound N/A N/A Primary Etiology: Calciphylaxis N/A N/A Secondary Etiology: Asthma, Deep Vein Thrombosis, N/A N/A Comorbid History: Hypertension, Peripheral Arterial Disease, Hepatitis B, Osteoarthritis, Neuropathy, Received Radiation 08/26/2021 N/A N/A Date Acquired: 15 N/A N/A Weeks of Treatment: Open N/A N/A Wound Status: No N/A N/A Wound Recurrence: 0.4x0.5x0.8 N/A N/A Measurements L x W x D (cm) 0.157 N/A N/A A (cm) : rea 0.126 N/A N/A Volume (cm) : -121.10% N/A N/A % Reduction in A rea: -77.50% N/A N/A % Reduction in Volume:  10 Position 1 (o'clock): 1.7 Maximum Distance 1 (cm): Yes N/A N/A Tunneling: Full Thickness With Exposed Support N/A N/A Classification: Structures Large N/A N/A Exudate Amount: Serosanguineous N/A N/A Exudate Type: red, brown N/A N/A Exudate Color: Yes N/A N/A Foul Odor A Cleansing: fter Yes N/A N/A Odor Anticipated Due to Product Use: Distinct, outline attached N/A N/A Wound Margin: Large (67-100%) N/A N/A Granulation A mount: Red N/A N/A Granulation Quality: Small (1-33%) N/A N/A Necrotic Amount: Fat Layer (Subcutaneous Tissue): Yes N/A N/A Exposed Structures: Fascia: No Tendon: No Muscle: No Joint: No Bone: No Small (1-33%) N/A N/A Epithelialization: No Abnormalities Noted N/A N/A Periwound Skin Texture: No Abnormalities Noted N/A N/A Periwound Skin Moisture: No Abnormalities Noted N/A N/A Periwound Skin Color: No Abnormality N/A N/A Temperature: Treatment Notes Wound #1 (Breast) Wound Laterality: Right Cleanser Normal Saline Discharge Instruction: Cleanse the wound with Normal Saline prior to applying a clean dressing using gauze sponges, not tissue or cotton balls. Wound Cleanser Discharge Instruction: Cleanse the wound with wound cleanser prior to applying a clean  dressing using gauze sponges, not tissue or cotton balls. Peri-Wound Care [name], [name] ([id number]) [id number] Page 5 of 9 Skin Prep Discharge Instruction: Use skin prep as directed Topical Primary Dressing Endoform 2x2 in Discharge Instruction: Moisten with saline Secondary Dressing Woven Gauze Sponge, Non-Sterile 4x4 in Discharge Instruction: In clinic Apply over primary dressing as directed. Zetuvit Plus Silicone Border Dressing 4x4 (in/in) Discharge Instruction: Apply silicone border over primary dressing as directed. Secured With Compression Wrap Compression Stockings Environmental education officer) Signed: 05/12/2022 4:28:13 PM By: Linton Ham MD Entered By: Linton Ham on 05/12/2022 15:02:10 -------------------------------------------------------------------------------- Multi-Disciplinary Care Plan Details Patient Name: Date of Service: [name]. 05/12/2022 2:30 PM Medical Record Number: [id number] Patient Account Number: [id number] Date of Birth/Sex: Treating RN: Sep 02, 1945 (76 y.o. [name], [name] Primary Care Michel Hendon: [name] Other Clinician: Referring Nilam Quakenbush: Treating Shir Bergman/Extender: [name] in Treatment: 15 Active Inactive Wound/Skin Impairment Nursing Diagnoses: Impaired tissue integrity Knowledge deficit related to ulceration/compromised skin integrity Goals: Patient will have a decrease in wound volume by X% from date: (specify in notes) Date Initiated: 01/27/2022 Target Resolution Date: 05/20/2022 Goal Status: Active Patient/caregiver will verbalize understanding of skin care regimen Date Initiated: 01/27/2022 Date Inactivated: 05/05/2022 Target Resolution Date: 04/23/2022 Goal Status: Met Ulcer/skin breakdown will have a volume reduction of 30% by week 4 Date Initiated: 01/27/2022 Target Resolution Date: 05/20/2022 Goal Status: Active Interventions: Assess  patient/caregiver ability to obtain necessary supplies Assess patient/caregiver ability to perform ulcer/skin care regimen upon admission and as needed Assess ulceration(s) every visit Notes: Electronic Signature(s) Signed: 05/13/2022 4:05:37 PM By: Rhae Hammock RN [place], [place], [place] ([id number]) PM By: Rhae Hammock RN [phone].pdf Page 6 of 9 Signed: 05/13/2022 4:05:37 Entered By: Rhae Hammock on 05/12/2022 14:22:14 -------------------------------------------------------------------------------- Pain Assessment Details Patient Name: Date of Service: [name], [name] 05/12/2022 2:30 PM Medical Record Number: [id number] Patient Account Number: [id number] Date of Birth/Sex: Treating RN: 07-03-1946 (76 y.o. F) Primary Care Kassity Woodson: [name] Other Clinician: Referring Linh Hedberg: Treating Judianne Seiple/Extender: [name] in Treatment: 15 Active Problems Location of Pain Severity and Description of Pain Patient Has Paino No Site Locations Pain Management and Medication Current Pain Management: Electronic Signature(s) Signed: 05/12/2022 4:47:05 PM By: [name] Entered By: [name] on 05/12/2022 14:23:59 -------------------------------------------------------------------------------- Patient/Caregiver Education Details Patient Name: Date of Service: [name] 10/18/2023andnbsp2:30 PM Medical Record Number: [id number] Patient Account Number: [id number] Date of Birth/Gender: Treating RN: 08-19-1945 (75  y.o. [name], [name] Primary Care Physician: [name] Other Clinician: Referring Physician: Treating Physician/Extender: [name] in Treatment: 15 Education Assessment Education Provided To: Patient Education Topics Provided [name], [name] ([id number]) [id number] Page 7 of 9 Wound/Skin Impairment: Methods:  Explain/Verbal Responses: Reinforcements needed, State content correctly Electronic Signature(s) Signed: 05/13/2022 4:05:37 PM By: Rhae Hammock RN Entered By: Rhae Hammock on 05/12/2022 14:22:24 -------------------------------------------------------------------------------- Wound Assessment Details Patient Name: Date of Service: [name] 05/12/2022 2:30 PM Medical Record Number: [id number] Patient Account Number: [id number] Date of Birth/Sex: Treating RN: [birth year] (76 y.o. F) Primary Care Teresha Hanks: [name] Other Clinician: Referring Mimi Debellis: Treating Ziyonna Christner/Extender: [name] in Treatment: 15 Wound Status Wound Number: 1 Primary Open Surgical Wound Etiology: Wound Location: Right Breast Secondary Calciphylaxis Wounding Event: Surgical Injury Etiology: Date Acquired: 08/26/2021 Wound Open Weeks Of Treatment: 15 Status: Clustered Wound: No Comorbid Asthma, Deep Vein Thrombosis, Hypertension, Peripheral Arterial History: Disease, Hepatitis B, Osteoarthritis, Neuropathy, Received Radiation Photos Wound Measurements Length: (cm) 0.4 Width: (cm) 0.5 Depth: (cm) 0.8 Area: (cm) 0.157 Volume: (cm) 0.126 % Reduction in Area: -121.1% % Reduction in Volume: -77.5% Epithelialization: Small (1-33%) Tunneling: Yes Position (o'clock): 10 Maximum Distance: (cm) 1.7 Undermining: No Wound Description Classification: Full Thickness With Exposed Suppo Wound Margin: Distinct, outline attached Exudate Amount: Large Exudate Type: Serosanguineous Exudate Color: red, brown rt Structures Foul Odor After Cleansing: Yes Due to Product Use: Yes Slough/Fibrino Yes Wound Bed Granulation Amount: Large (67-100%) Exposed Structure Granulation Quality: Red Fascia Exposed: No Necrotic Amount: Small (1-33%) Fat Layer (Subcutaneous Tissue) Exposed: Yes Necrotic Quality: Adherent Slough Tendon Exposed: No Muscle Exposed:  No [name], [name] ([id number]) [id number] Page 8 of 9 Joint Exposed: No Bone Exposed: No Periwound Skin Texture Texture Color No Abnormalities Noted: Yes No Abnormalities Noted: Yes Moisture Temperature / Pain No Abnormalities Noted: Yes Temperature: No Abnormality Treatment Notes Wound #1 (Breast) Wound Laterality: Right Cleanser Normal Saline Discharge Instruction: Cleanse the wound with Normal Saline prior to applying a clean dressing using gauze sponges, not tissue or cotton balls. Wound Cleanser Discharge Instruction: Cleanse the wound with wound cleanser prior to applying a clean dressing using gauze sponges, not tissue or cotton balls. Peri-Wound Care Skin Prep Discharge Instruction: Use skin prep as directed Topical Primary Dressing Endoform 2x2 in Discharge Instruction: Moisten with saline Secondary Dressing Woven Gauze Sponge, Non-Sterile 4x4 in Discharge Instruction: In clinic Apply over primary dressing as directed. Zetuvit Plus Silicone Border Dressing 4x4 (in/in) Discharge Instruction: Apply silicone border over primary dressing as directed. Secured With Compression Wrap Compression Stockings Add-Ons Electronic Signature(s) Signed: 05/12/2022 4:47:05 PM By: [name] Entered By: [name] on 05/12/2022 14:32:18 -------------------------------------------------------------------------------- Vitals Details Patient Name: Date of Service: [name]. 05/12/2022 2:30 PM Medical Record Number: [id number] Patient Account Number: [id number] Date of Birth/Sex: Treating RN: 08/30/1945 (76 y.o. F) Primary Care Maryjane Benedict: [name] Other Clinician: Referring Chieko Neises: Treating Arrion Burruel/Extender: [name] in Treatment: 15 Vital Signs Time Taken: 14:21 Temperature (F): 97.7 Height (in): 64 Pulse (bpm): 71 Weight (lbs): 180 Respiratory Rate (breaths/min): 18 Body Mass Index (BMI):  30.9 Blood Pressure (mmHg): 123/87 Reference Range: 80 - 120 mg / dl Electronic Signature(s) Signed: 05/12/2022 4:47:05 PM By: Naomie Dean ([id number]) PM By: [name] [phone].pdf Page 9 of 9 Signed: 05/12/2022 4:47:05 Entered By: [name] on 05/12/2022 14:23:34

## 2022-05-15 NOTE — Progress Notes (Unsigned)
Cardiology Office Note   Date:  05/17/2022   ID:  Mandee, Pluta 1946/04/02, MRN [id number]  PCP:  [name]., PA-C  Cardiologist:   Minus Breeding, MD   Chief Complaint  Patient presents with   Chest Pain      History of Present Illness: [name] is a 76 y.o. female who presents for with a hx of normal coronaries by heart cath in 1998 with nonischemic stress tests in 2012 and Jan 2019. In 04/2020 she complained of chest pain. Follow up ETT was negative for ischemia.   She was in the ED last week with chest pain.  I reviewed these records for this visit.   There was no objective evidence of ischemia.  She says that the pain has been going on for few months.  She describes some pain under her left breast.  It is also going from her shoulder to her left elbow.  It is somewhat up to her neck and into her head.  She describes it as somewhat sharp.  It can be 10 out of 10.  It might last for an hour or so.  It happens at rest.  She is not describing associated nausea vomiting or diaphoresis.  She is not having any palpitations, presyncope or syncope.  She does get short of breath when she is bending over but she is not describing PND or orthopnea.  She is concerned because of her previous vascular disease including carotid stenosis.   Past Medical History:  Diagnosis Date   Anxiety    severe panic attacks   Arthritis    Aseptic meningitis    Asthma    as child   Breast cancer ([place])    right   Chronic anticoagulation 08/04/2015   Collapsed lung    hx of, left; following left thoracentesis for post-operative pleural effusion > 20 years ago   Complication of anesthesia    "hard to put asleep"; awareness under anesthesia > 35 years ago for hysterectomy   Depression    Fibromyalgia    GERD (gastroesophageal reflux disease)    H/O hiatal hernia    Headache(784.0)    Hepatitis 1990   "from eating at restaurant"   History of antiphospholipid antibody  syndrome    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   Hx-TIA (transient ischemic attack)    Hyperlipidemia    Hypertension    Hypothyroidism    Neuromuscular disorder ([place])    Personal history of radiation therapy    Pneumonia    hx of   Soft tissue mass 06/14/2017   3 cm right post axilla same side as previous breast cancer 06/14/17   Stroke ([place])    Thrombosis, 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   Left digital artery  October 1998 - new dx antiphospholipid antibody syndrome    Past Surgical History:  Procedure Laterality Date   ABDOMINAL HYSTERECTOMY     BREAST LUMPECTOMY Right 2000   BREAST LUMPECTOMY Right 09/15/2021   Procedure: RIGHT BREAST LUMPECTOMY;  Surgeon: Erroll Luna, MD;  Location: [facility];  Service: General;  Laterality: Right;   BREAST SURGERY Right    x3   BUNIONECTOMY Bilateral 30 years ago   CARDIAC CATHETERIZATION  07/12/1997   NORMAL LEFT VENTRICULAR FUNCTION WITH EF AT LEAST 70-75%   ENDARTERECTOMY Right 04/23/2021   Procedure: RIGHT CAROTID ENDARTERECTOMY;  Surgeon: Waynetta Sandy, MD;  Location: [facility];  Service: Vascular;  Laterality: Right;   ESOPHAGEAL MANOMETRY N/A 11/06/2012   Procedure: ESOPHAGEAL MANOMETRY (EM);  Surgeon: Pedro Earls, MD;  Location: [facility];  Service: General;  Laterality: N/A;   ESOPHAGOGASTRODUODENOSCOPY ENDOSCOPY     KNEE ARTHROSCOPY Right    LAPAROSCOPIC NISSEN FUNDOPLICATION N/A 5/88/5027   Procedure: LAPAROSCOPIC TAKEDOWN  PERI-HIATAL HERNIA    REPAIR;  Surgeon: Pedro Earls, MD;  Location: [facility];  Service: General;  Laterality: N/A;   [place] Right 04/23/2021   Procedure: PATCH ANGIOPLASTY;  Surgeon: Waynetta Sandy, MD;  Location: [facility];  Service: Vascular;  Laterality: Right;   [place]  in 20's   TONSILLECTOMY  76 years old   UPPER GI ENDOSCOPY N/A 12/12/2012   Procedure: UPPER  GI ENDOSCOPY;  Surgeon: Pedro Earls, MD;  Location: [facility];  Service: General;  Laterality: N/A;     Current Outpatient Medications  Medication Sig Dispense Refill   ALPRAZolam (XANAX) 1 MG tablet Take 1 mg by mouth 2 (two) times daily.      Ascorbic Acid (VITAMIN C) 1000 MG tablet Take 1,000 mg by mouth daily.     aspirin 81 MG tablet Take 81 mg by mouth every evening.     benzocaine-menthol (CHLORAEPTIC) 6-10 MG lozenge Take 1 lozenge by mouth as needed for sore throat. 45 tablet 0   Calcium Carb-Cholecalciferol (CALCIUM-VITAMIN D) 600-400 MG-UNIT TABS Take 1 tablet by mouth daily.     Cyanocobalamin (VITAMIN B 12) 500 MCG TABS Take 500 mcg by mouth daily.     EPINEPHrine 0.3 mg/0.3 mL IJ SOAJ injection Inject 0.3 mg into the muscle as needed for anaphylaxis. 1 each 0   fluticasone (FLONASE) 50 MCG/ACT nasal spray Place 2 sprays into the nose daily as needed for allergies.      HYDROcodone-acetaminophen (NORCO/VICODIN) 5-325 MG tablet Take 1 tablet by mouth every 6 (six) hours as needed for moderate pain. 15 tablet 0   levothyroxine (SYNTHROID, LEVOTHROID) 112 MCG tablet Take 112 mcg by mouth See admin instructions. Take 112 mcg by mouth daily on Sun, Tues, Wed, Thurs, Sat     levothyroxine (SYNTHROID, LEVOTHROID) 125 MCG tablet Take 125 mcg by mouth See admin instructions. Take 125 mcg by mouth on Monday and Friday     lidocaine (XYLOCAINE) 2 % solution Use as directed 15 mLs in the mouth or throat as needed for mouth pain. 100 mL 0   magnesium oxide (MAG-OX) 400 MG tablet Take 400 mg by mouth daily.     metoprolol tartrate (LOPRESSOR) 100 MG tablet Take 100 mg 2 hours before Coronary CT 1 tablet 0   metoprolol tartrate (LOPRESSOR) 25 MG tablet Take 12.5 mg by mouth 2 (two) times daily.     omeprazole (PRILOSEC) 20 MG capsule Take 20 mg by mouth daily.      pravastatin (PRAVACHOL) 80 MG tablet Take 80 mg by mouth daily.     sertraline (ZOLOFT) 100 MG tablet Take 100 mg by mouth daily.      warfarin (COUMADIN) 5 MG tablet Take 5 mg daily except on Mondays: take 2.5 mg (1/2 tablet) 90 tablet 3   No current facility-administered medications for this visit.    Allergies:   Bee venom, Hornet venom, Reglan [metoclopramide], Vitamin k, Baclofen, Cephalexin, Benadryl [diphenhydramine hcl], Ciprofloxacin, Crestor [rosuvastatin calcium], Cymbalta [duloxetine hcl], Daypro [oxaprozin], Doxycycline, Levofloxacin, Sulfonamide derivatives, and Zocor [simvastatin]    ROS:  Please see the history of present illness.   Otherwise, review of systems are positive for none.   All other systems are reviewed and negative.    PHYSICAL EXAM: VS:  BP 130/80   Pulse 80   Ht '5\' 8"'$  (1.727 m)   Wt 194 lb (88 kg)   SpO2 98%   BMI 29.50 kg/m  , BMI Body mass index is 29.5 kg/m. GENERAL:  Well appearing HEENT:  Pupils equal round and reactive, fundi not visualized, oral mucosa unremarkable NECK:  No jugular venous distention, waveform within normal limits, carotid upstroke brisk and symmetric, no bruits, no thyromegaly LYMPHATICS:  No cervical, inguinal adenopathy LUNGS:  Clear to auscultation bilaterally BACK:  No CVA tenderness CHEST:  Unremarkable HEART:  PMI not displaced or sustained,S1 and S2 within normal limits, no S3, no S4, no clicks, no rubs, no murmurs ABD:  Flat, positive bowel sounds normal in frequency in pitch, no bruits, no rebound, no guarding, no midline pulsatile mass, no hepatomegaly, no splenomegaly EXT:  2 plus pulses upper pulses and diminished dorsalis pedis and posttibial's bilateral lower, no edema, no cyanosis no clubbing   EKG:  EKG is not ordered today. The ekg ordered 05/11/2022 demonstrates sinus rhythm, rate 73, axis within normal limits, intervals within normal limits, no acute ST-T wave changes.   Recent Labs: 09/14/2021: ALT 20 05/11/2022: BUN 17; Creatinine, Ser 1.15; Hemoglobin 12.6; Platelets 266; Potassium 4.4; Sodium 139    Lipid Panel    Component  Value Date/Time   CHOL 167 04/24/2021 0331   TRIG 129 04/24/2021 0331   HDL 38 (L) 04/24/2021 0331   CHOLHDL 4.4 04/24/2021 0331   VLDL 26 04/24/2021 0331   LDLCALC 103 (H) 04/24/2021 0331      Wt Readings from Last 3 Encounters:  05/17/22 194 lb (88 kg)  05/11/22 188 lb (85.3 kg)  11/25/21 190 lb 4.8 oz (86.3 kg)      Other studies Reviewed: Additional studies/ records that were reviewed today include: ED records. Review of the above records demonstrates:  Please see elsewhere in the note.     ASSESSMENT AND PLAN:  Chest pain: The chest discomfort has anginal and nonanginal features.  Because of some knee problems she would be able to walk on a treadmill.  I need to exclude obstructive coronary disease particularly with her history of peripheral vascular disease.  I am going to order a coronary CTA.  Dyslipidemia: LDL is 134.  She has been intolerant of statins.  She does consent to try PCSK9 and I will send her to our Pharm.[facility]. [facility]id [facility]  Carotid stenosis: She has a patent carotid endarterectomy on the right and less than 39% stenosis on the left.  This will be followed again next year.  Current medicines are reviewed at length with the patient today.  The patient does not have concerns regarding medicines.  The following changes have been made:  As above  Labs/ tests ordered today include:   Orders Placed This Encounter  Procedures   CT CORONARY MORPH W/CTA COR W/SCORE W/CA W/CM &/OR WO/CM   AMB Referral to [facility] Pharm-D     Disposition:   FU with me in one year.     Signed, Minus Breeding, MD  05/17/2022 1:13 PM    [place]

## 2022-05-17 ENCOUNTER — Encounter: Payer: Self-pay | Admitting: Cardiology

## 2022-05-17 ENCOUNTER — Ambulatory Visit: Payer: Medicare HMO | Attending: Cardiology | Admitting: Cardiology

## 2022-05-17 VITALS — BP 130/80 | HR 80 | Ht 68.0 in | Wt 194.0 lb

## 2022-05-17 DIAGNOSIS — E785 Hyperlipidemia, unspecified: Secondary | ICD-10-CM | POA: Diagnosis not present

## 2022-05-17 DIAGNOSIS — R072 Precordial pain: Secondary | ICD-10-CM | POA: Diagnosis not present

## 2022-05-17 MED ORDER — METOPROLOL TARTRATE 100 MG PO TABS: ORAL_TABLET | ORAL | 0 refills | Status: DC

## 2022-05-17 NOTE — Patient Instructions (Addendum)
Medication Instructions:  Continue all medications *If you need a refill on your cardiac medications before your next appointment, please call your pharmacy*   Lab Work: None ordered   Testing/Procedures: Coronary CTA  will be scheduled after approved by insurance   Follow instructions below   Follow-Up: At [facility], you and your health needs are our priority.  As part of our continuing mission to provide you with exceptional heart care, we have created designated Provider Care Teams.  These Care Teams include your primary Cardiologist (physician) and Advanced Practice Providers (APPs -  Physician Assistants and Nurse Practitioners) who all work together to provide you with the care you need, when you need it.  We recommend signing up for the patient portal called "MyChart".  Sign up information is provided on this After Visit Summary.  MyChart is used to connect with patients for Virtual Visits (Telemedicine).  Patients are able to view lab/test results, encounter notes, upcoming appointments, etc.  Non-urgent messages can be sent to your provider as well.   To learn more about what you can do with MyChart, go to [email or web address].    Your next appointment:  1 year   Call in July to schedule Oct appointment     The format for your next appointment: Office   Provider:  Dr.Hochrein   Schedule appointment with Pharm D to consider starting Repatha      Your cardiac CT will be scheduled at one of the below locations:   [facility] [address] [place], [place] [address] [phone]  [facility] [address] [place], [place] [address] [phone]  [facility] [place], [place] [address] [phone]  If scheduled at [facility], please arrive at the [facility] En[facility] (Entrance C2) of [facility] 30 minutes  prior to test start time. You can use the FREE valet parking offered at entrance C (encouraged to control the heart rate for the test)  Proceed to the [facility] Radiology Department (first floor) to check-in and test prep.  All radiology patients and guests should use entrance C2 at [facility], accessed from [facility], even though the hospital's physical address listed is [address].    If scheduled at [facility] or [address] [facility], please arrive 15 mins early for check-in and test prep.   Please follow these instructions carefully (unless otherwise directed):    On the Night Before the Test: Be sure to Drink plenty of water. Do not consume any caffeinated/decaffeinated beverages or chocolate 12 hours prior to your test. Do not take any antihistamines 12 hours prior to your test.   On the Day of the Test: Drink plenty of water until 1 hour prior to the test. Do not eat any food 1 hour prior to test. You may take your regular medications prior to the test.  Take metoprolol 100 mg two hours prior to test. HOLD Metoprolol 25 mg morning of test FEMALES- please wear underwire-free bra if available, avoid dresses & tight clothing         After the Test: Drink plenty of water. After receiving IV contrast, you may experience a mild flushed feeling. This is normal. On occasion, you may experience a mild rash up to 24 hours after the test. This is not dangerous. If this occurs, you can take Benadryl 25 mg  and increase your fluid intake. If you experience trouble breathing, this can be serious. If it is severe call 911 IMMEDIATELY. If it is mild, please call our office.  We will call to schedule your test 2-4 weeks out understanding that some insurance companies will need an authorization prior to the service being performed.   For non-scheduling related questions, please contact the cardiac imaging nurse  navigator should you have any questions/concerns: Marchia Bond, Cardiac Imaging Nurse Navigator Gordy Clement, Cardiac Imaging Nurse Navigator [facility] Direct Office Dial: [phone]   For scheduling needs, including cancellations and rescheduling, please call [place], [phone].  Important Information About [place]

## 2022-05-19 ENCOUNTER — Telehealth: Payer: Self-pay | Admitting: Cardiology

## 2022-05-19 NOTE — Telephone Encounter (Signed)
error 

## 2022-05-26 ENCOUNTER — Encounter (HOSPITAL_BASED_OUTPATIENT_CLINIC_OR_DEPARTMENT_OTHER): Payer: Medicare HMO | Attending: Physician Assistant | Admitting: Physician Assistant

## 2022-05-26 DIAGNOSIS — L598 Other specified disorders of the skin and subcutaneous tissue related to radiation: Secondary | ICD-10-CM | POA: Diagnosis not present

## 2022-05-26 DIAGNOSIS — G629 Polyneuropathy, unspecified: Secondary | ICD-10-CM | POA: Diagnosis not present

## 2022-05-26 DIAGNOSIS — M1990 Unspecified osteoarthritis, unspecified site: Secondary | ICD-10-CM | POA: Insufficient documentation

## 2022-05-26 DIAGNOSIS — I10 Essential (primary) hypertension: Secondary | ICD-10-CM | POA: Diagnosis not present

## 2022-05-26 DIAGNOSIS — L98492 Non-pressure chronic ulcer of skin of other sites with fat layer exposed: Secondary | ICD-10-CM | POA: Insufficient documentation

## 2022-05-26 DIAGNOSIS — D6861 Antiphospholipid syndrome: Secondary | ICD-10-CM | POA: Diagnosis not present

## 2022-05-26 DIAGNOSIS — I2510 Atherosclerotic heart disease of native coronary artery without angina pectoris: Secondary | ICD-10-CM | POA: Insufficient documentation

## 2022-05-26 NOTE — Progress Notes (Addendum)
[name], [name] ([id number]) [id number] Page 1 of 7 Visit Report for 05/26/2022 Chief Complaint Document Details Patient Name: Date of Service: [name], [name] 05/26/2022 11:30 A M Medical Record Number: [id number] Patient Account Number: [id number] Date of Birth/Sex: Treating RN: [birth year] (76 y.o. [name], [name] Primary Care Provider: [name] Other Clinician: Referring Provider: Treating Provider/Extender: [name] in Treatment: 17 Information Obtained from: Patient Chief Complaint Right breast ulcer Electronic Signature(s) Signed: 05/26/2022 12:11:14 PM By: Worthy Keeler PA-C Entered By: Worthy Keeler on 05/26/2022 12:11:13 -------------------------------------------------------------------------------- HPI Details Patient Name: Date of Service: [name], [name] 05/26/2022 11:30 A M Medical Record Number: [id number] Patient Account Number: [id number] Date of Birth/Sex: Treating RN: 1945-09-17 (76 y.o. [name], [name] Primary Care Provider: [name] Other Clinician: Referring Provider: Treating Provider/Extender: [name] in Treatment: 17 History of Present Illness HPI Description: 01-27-2022 upon evaluation today patient presents for initial evaluation here in our clinic concerning issues she has been having with her right breast. She had an area of calcium buildup where she had actual staples that were being pushed out that were noted from her previous breast surgery which was roughly 23 years ago when she had cancer. At that point she had also undergone radiation therapy. 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 Nonetheless after her most recent surgery in February to remove the staples and the calcium deposit this has started to build back up due to the damage to the breast tissue. She was seen initially by Dr. Brantley Stage.  Eventually he referred her to Dr. Rebekah Chesterfield to see about a mastectomy to take care of the issue. With that being said this was something that was good to be reserved just for worst-case scenario in fact they were a little bit concerned about her reaction considering her blood thinners and the fact that she may not do as well and surgery is what they would like to see with all the other medical problems that she has. For that reason they referred her to [name] in order to see what we can do from a healing standpoint. 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 02-03-2022 upon evaluation today patient appears to be doing okay in regard to her wound on the breast. Fortunately I do not see any signs of worsening. Unfortunately I do believe that she is still having some discomfort also there was some misunderstanding about how to do the dressing which I think will make it a little bit easier she should be putting it in before waiting it and then after its in and situated she can wet this. I think that is going to be a lot easier for her than what she has been trying to do. 02-10-2022 upon evaluation today patient appears to be doing about the same in regard to her wound. She has been tolerating the dressing changes without complication. We are actually starting her on linezolid today actually had to apply to get this approved by her insurance I am hopeful that this will be affordable for her now it was going to cost her way too much before. With that being said  I do believe that she is headed in the right direction which is great news. No fevers, chills, nausea, vomiting, or diarrhea. 7/26; right breast  apparently secondary to surgery earlier this year for protruding staples and underlying traumatic calcifications. She has a nonhealing wound with some depth and tunneling. She has been using Hydrofera Blue with some improvement in measurements today. Nevertheless she has a wound VAC and we went ahead and applied this. I am not certain whether she is going to be eligible for home health but her staff are certainly going to work on it 02-24-2022 upon evaluation today patient appears to be doing excellent she is using the gauze VAC at this point and it seems to be doing a great job for her. Fortunately there does not appear to be any signs of active infection at this time which is excellent. No fever or chills noted 03-10-2022 upon evaluation today patient appears to be doing well with regard to her wound although the wound was definitely over packed compared to what it [name], [name] ([id number]) [id number] Page 2 of 7 needs to be. I think they were probably got to switch to [facility] Blue rope which I think would be much easier to put him than the current black foam which is what home health is actually using we will try to do a gauze VAC but to be honest there is not actually doing that. Fortunately there does not appear to be any signs of active infection locally or systemically at this time which is great news. I think the biggest issue I see is simply that we need to make sure and have the patient have the Hydrofera Blue rope which is again I think a much better way to go with this and avoid altogether the use of the black foam inside the wound and put a piece on the outside that is appropriate and fine. 03-17-2022 upon evaluation today patient appears to be doing well currently in regard to her wound. The wound VAC does seem to be helping the [facility] seems to be much better and very pleased in this regard. I do not see any signs of active infection locally or  systemically at this time which is great news. No fevers, chills, nausea, vomiting, or diarrhea. 03-24-2022 upon evaluation today patient appears to be doing well with regard to her wound. I am seeing signs of improvement which is great news. Fortunately she is not doing any worse unfortunately they did not have the supplies and her wound VAC on until yesterday but otherwise she is doing excellent. 03-31-2022 upon evaluation today patient appears to be doing well currently in regard to her wound. I do feel like the wound VAC is doing well we may be getting close to the point of not utilizing the wound VAC any longer the patient states that she would be happy to get to that point. Fortunately I do not see any evidence of active infection locally or systemically which is great news. 04-07-2022 upon evaluation today patient appears to be doing well currently in regard to the wound on her right breast area. Fortunately there does not appear to be any signs of infection at this time which is great news and overall I am extremely pleased with where we stand today. No fevers, chills, nausea, vomiting, or diarrhea. 04-21-2022 upon evaluation today patient appears to be doing well currently in regard to her wound. She has been tolerating the dressing changes with the [facility] without  complication and overall I am extremely pleased with where things stand today. There does not appear to be any evidence of active infection locally or systemically at this time which is great news. No fevers, chills, nausea, vomiting, or diarrhea. I do believe that she is headed in the right direction here. 05-05-2022 upon evaluation today patient actually appears to be doing excellent in regard to her wound. This is showing signs of great improvement and very pleased with where we stand. I do not see any signs of infection locally or systemically at this time which is great news. No fevers, chills, nausea, vomiting, or  diarrhea. 10/18; small open area in the right breast but with 1.7 cm of depth. Surgical wound in the setting of remote radiation greater than 20 years ago. Switch to endoform last week 05-26-2022 upon evaluation today patient appears to be doing well currently in regard to the overall appearance. Fortunately I do not see any signs of active infection locally or systemically at this time which is great news. No fevers, chills, nausea, vomiting, or diarrhea. Electronic Signature(s) Signed: 05/26/2022 12:51:15 PM By: Worthy Keeler PA-C Entered By: Worthy Keeler on 05/26/2022 12:51:15 -------------------------------------------------------------------------------- Physical Exam Details Patient Name: Date of Service: [name], [name] 05/26/2022 11:30 A M Medical Record Number: [id number] Patient Account Number: [id number] Date of Birth/Sex: Treating RN: 21-Sep-1945 (76 y.o. [name] Primary Care Provider: [name] Other Clinician: Referring Provider: Treating Provider/Extender: Renae Fickle Weeks in Treatment: 33 Constitutional Well-nourished and well-hydrated in no acute distress. Respiratory normal breathing without difficulty. Psychiatric this patient is able to make decisions and demonstrates good insight into disease process. Alert and Oriented x 3. pleasant and cooperative. Notes Upon inspection patient's wound bed actually showed signs of significant improvement which is great news and overall I am extremely pleased with where things stand currently. I do not see any evidence of infection which is great news and overall I think that we are on the right track here. In general I do believe that we will get this closed although I definitely think she is good to have a divot and some scar tissue in this area. Electronic Signature(s) Signed: 05/26/2022 12:51:44 PM By: Worthy Keeler PA-C Entered By: Worthy Keeler on 05/26/2022 12:51:44 [place],  [name] ([id number]) [id number] Page 3 of 7 -------------------------------------------------------------------------------- Physician Orders Details Patient Name: Date of Service: NATALINA, WIETING 05/26/2022 11:30 A M Medical Record Number: [id number] Patient Account Number: [id number] Date of Birth/Sex: Treating RN: [birth year] (76 y.o. [name], [name] Primary Care Provider: [name] Other Clinician: Referring Provider: Treating Provider/Extender: [name] in Treatment: [phone] Verbal / Phone Orders: No Diagnosis Coding ICD-10 Coding Code Description T81.31XA Disruption of external operation (surgical) wound, not elsewhere classified, initial encounter L98.492 Non-pressure chronic ulcer of skin of other sites with fat layer exposed E83.59 Other disorders of calcium metabolism L59.8 Other specified disorders of the skin and subcutaneous tissue related to radiation D68.61 Antiphospholipid syndrome I10 Essential (primary) hypertension I25.10 Atherosclerotic heart disease of native coronary artery without angina pectoris Follow-up Appointments ppointment in 2 weeks. - w/ Jeri Cos, Wednesday's Return A Bathing/ Shower/ Hygiene May shower with protection but do not get wound dressing(s) wet. - May take dressing off and wash with antibacterial soap and water Negative Presssure Wound Therapy Discontinue wound [place] home health for wound care. Wound Treatment Wound #1 - Breast Wound Laterality: Right Cleanser: Normal Saline (Generic) 3 x  Per Week/15 Days Discharge Instructions: Cleanse the wound with Normal Saline prior to applying a clean dressing using gauze sponges, not tissue or cotton balls. Cleanser: Wound Cleanser (Generic) 3 x Per Week/15 Days Discharge Instructions: Cleanse the wound with wound cleanser prior to applying a clean dressing using gauze sponges, not tissue or cotton  balls. Peri-Wound Care: Skin Prep (Generic) 3 x Per Week/15 Days Discharge Instructions: Use skin prep as directed Prim Dressing: Endoform 2x2 in (DME) (Generic) 3 x Per Week/15 Days ary Discharge Instructions: Moisten with saline Secondary Dressing: Woven Gauze Sponge, Non-Sterile 4x4 in (DME) (Generic) 3 x Per Week/15 Days Discharge Instructions: In clinic Apply over primary dressing as directed. Secondary Dressing: Zetuvit Plus Silicone Border Dressing 4x4 (in/in) (DME) (Generic) 3 x Per Week/15 Days Discharge Instructions: Apply silicone border over primary dressing as directed. Electronic Signature(s) Signed: 05/26/2022 6:14:51 PM By: Worthy Keeler PA-C Signed: 06/01/2022 3:49:22 PM By: Rhae Hammock RN Entered By: Rhae Hammock on 05/26/2022 12:18:11 [place], [name] ([id number]) [id number] Page 4 of 7 -------------------------------------------------------------------------------- Problem List Details Patient Name: Date of Service: [name], [name] 05/26/2022 11:30 A M Medical Record Number: [id number] Patient Account Number: [id number] Date of Birth/Sex: Treating RN: [birth year] (76 y.o. [name], [name] Primary Care Provider: [name] Other Clinician: Referring Provider: Treating Provider/Extender: [name] in Treatment: 17 Active Problems ICD-10 Encounter Code Description Active Date MDM Diagnosis T81.31XA Disruption of external operation (surgical) wound, not elsewhere classified, 01/27/2022 No Yes initial encounter L98.492 Non-pressure chronic ulcer of skin of other sites with fat layer exposed 01/27/2022 No Yes E83.59 Other disorders of calcium metabolism 01/27/2022 No Yes L59.8 Other specified disorders of the skin and subcutaneous tissue related to 01/27/2022 No Yes radiation D68.61 Antiphospholipid syndrome 01/27/2022 No Yes I10 Essential (primary) hypertension 01/27/2022 No Yes I25.10  Atherosclerotic heart disease of native coronary artery without angina pectoris 01/27/2022 No Yes Inactive Problems Resolved Problems Electronic Signature(s) Signed: 05/26/2022 12:11:00 PM By: Worthy Keeler PA-C Entered By: Worthy Keeler on 05/26/2022 12:11:00 -------------------------------------------------------------------------------- Progress Note Details Patient Name: Date of Service: [name] Oiler. 05/26/2022 11:30 A M Medical Record Number: [id number] Patient Account Number: [id number] Date of Birth/Sex: Treating RN: [birth year] (76 y.o. [name], [name] Primary Care Provider: [name] Other Clinician: Referring Provider: Treating Provider/Extender: Regenia Skeeter, Chauncey Fischer in Treatment: [address], [place] ([id number]) [id number] Page 5 of 7 Subjective Chief Complaint Information obtained from Patient Right breast ulcer History of Present Illness (HPI) 01-27-2022 upon evaluation today patient presents for initial evaluation here in our clinic concerning issues she has been having with her right breast. She had an area of calcium buildup where she had actual staples that were being pushed out that were noted from her previous breast surgery which was roughly 23 years ago when she had cancer. At that point she had also undergone radiation therapy. 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 Nonetheless after her most recent surgery in February to remove the staples and the calcium deposit this has started to build back up due to the damage to the breast tissue. She was seen initially by Dr. Brantley Stage. Eventually he referred her to Dr. Rebekah Chesterfield to see about a mastectomy to take care of the issue. With that being said this was something that was good to be reserved just for worst-case scenario in fact they were a little bit concerned about her reaction considering her blood thinners and the  fact that she may not do as well and surgery is what they would like to see with all the other medical problems that she has. For that reason they referred her to [name] in order to see what we can do from a healing standpoint. 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 02-03-2022 upon evaluation today patient appears to be doing okay in regard to her wound on the breast. Fortunately I do not see any signs of worsening. Unfortunately I do believe that she is still having some discomfort also there was some misunderstanding about how to do the dressing which I think will make it a little bit easier she should be putting it in before waiting it and then after its in and situated she can wet this. I think that is going to be a lot easier for her than what she has been trying to do. 02-10-2022 upon evaluation today patient appears to be doing about the same in regard to her wound. She has been tolerating the dressing changes without complication. We are actually starting her on linezolid today actually had to apply to get this approved by her insurance I am hopeful that this will be affordable for her now it was going to cost her way too much before. With that being said I do believe that she is headed in the right direction which is great news. No fevers, chills, nausea, vomiting, or diarrhea. 7/26; right breast apparently secondary to surgery earlier this year for protruding staples and underlying traumatic calcifications. She has a nonhealing wound with some depth and tunneling. She has been using Hydrofera Blue with some improvement in measurements today. Nevertheless she has a wound VAC and we went ahead  and applied this. I am not certain whether she is going to be eligible for home health but her staff are certainly going to work on it 02-24-2022 upon evaluation today patient appears to be doing excellent she is using the gauze VAC at this point and it seems to be doing a great job for her. Fortunately there does not appear to be any signs of active infection at this time which is excellent. No fever or chills noted 03-10-2022 upon evaluation today patient appears to be doing well with regard to her wound although the wound was definitely over packed compared to what it needs to be. I think they were probably got to switch to [facility] Blue rope which I think would be much easier to put him than the current black foam which is what home health is actually using we will try to do a gauze VAC but to be honest there is not actually doing that. Fortunately there does not appear to be any signs of active infection locally or systemically at this time which is great news. I think the biggest issue I see is simply that we need to make sure and have the patient have the Hydrofera Blue rope which is again I think a much better way to go with this and avoid altogether the use of the black foam inside the wound and put a piece on the outside that is appropriate and fine. 03-17-2022 upon evaluation today patient appears to be doing well currently  in regard to her wound. The wound VAC does seem to be helping the [facility] seems to be much better and very pleased in this regard. I do not see any signs of active infection locally or systemically at this time which is great news. No fevers, chills, nausea, vomiting, or diarrhea. 03-24-2022 upon evaluation today patient appears to be doing well with regard to her wound. I am seeing signs of improvement which is great news. Fortunately she is not doing any worse unfortunately they did not have the supplies and her wound VAC on until yesterday but otherwise she is doing  excellent. 03-31-2022 upon evaluation today patient appears to be doing well currently in regard to her wound. I do feel like the wound VAC is doing well we may be getting close to the point of not utilizing the wound VAC any longer the patient states that she would be happy to get to that point. Fortunately I do not see any evidence of active infection locally or systemically which is great news. 04-07-2022 upon evaluation today patient appears to be doing well currently in regard to the wound on her right breast area. Fortunately there does not appear to be any signs of infection at this time which is great news and overall I am extremely pleased with where we stand today. No fevers, chills, nausea, vomiting, or diarrhea. 04-21-2022 upon evaluation today patient appears to be doing well currently in regard to her wound. She has been tolerating the dressing changes with the [facility] without complication and overall I am extremely pleased with where things stand today. There does not appear to be any evidence of active infection locally or systemically at this time which is great news. No fevers, chills, nausea, vomiting, or diarrhea. I do believe that she is headed in the right direction here. 05-05-2022 upon evaluation today patient actually appears to be doing excellent in regard to her wound. This is showing signs of great improvement and very pleased with where we stand. I do not see any signs of infection locally or systemically at this time which is great news. No fevers, chills, nausea, vomiting, or diarrhea. 10/18; small open area in the right breast but with 1.7 cm of depth. Surgical wound in the setting of remote radiation greater than 20 years ago. Switch to endoform last week 05-26-2022 upon evaluation today patient appears to be doing well currently in regard to the overall appearance. Fortunately I do not see any signs of active infection locally or systemically at this time which is  great news. No fevers, chills, nausea, vomiting, or diarrhea. Objective Constitutional Well-nourished and well-hydrated in no acute distress. [name], [name] ([id number]) [id number] Page 6 of 7 Vitals Time Taken: 11:45 AM, Height: 64 in, Weight: 180 lbs, BMI: 30.9, Temperature: 97.8 F, Pulse: 84 bpm, Respiratory Rate: 20 breaths/min, Blood Pressure: 115/72 mmHg. Respiratory normal breathing without difficulty. Psychiatric this patient is able to make decisions and demonstrates good insight into disease process. Alert and Oriented x 3. pleasant and cooperative. General Notes: Upon inspection patient's wound bed actually showed signs of significant improvement which is great news and overall I am extremely pleased with where things stand currently. I do not see any evidence of infection which is great news and overall I think that we are on the right track here. In general I do believe that we will get this closed although I definitely think she is good to have a divot and some scar tissue in this  area. Integumentary (Hair, Skin) Wound #1 status is Open. Original cause of wound was Surgical Injury. The date acquired was: 08/26/2021. The wound has been in treatment 17 weeks. The wound is located on the Right Breast. The wound measures 0.4cm length x 0.3cm width x 0.7cm depth; 0.094cm^2 area and 0.066cm^3 volume. There is Fat Layer (Subcutaneous Tissue) exposed. There is no undermining noted, however, there is tunneling at 10:00 with a maximum distance of 1.7cm. There is a large amount of serosanguineous drainage noted. Foul odor after cleansing was noted. Foul odor after cleansing was noted due to product use. The wound margin is distinct with the outline attached to the wound base. There is large (67-100%) red granulation within the wound bed. There is a small (1-33%) amount of necrotic tissue within the wound bed including Adherent Slough. The periwound skin  appearance had no abnormalities noted for texture. The periwound skin appearance had no abnormalities noted for moisture. The periwound skin appearance had no abnormalities noted for color. Periwound temperature was noted as No Abnormality. 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 - w/ Jeri Cos, Wednesday's Bathing/ Shower/ Hygiene: May shower with protection but do not get wound dressing(s) wet. - May take dressing off and wash with antibacterial soap and water Negative Presssure Wound Therapy: Discontinue wound vac Home Health: [facility] for wound care. WOUND #1: - Breast Wound Laterality: Right Cleanser: Normal Saline (Generic) 3 x Per Week/15 Days Discharge Instructions: Cleanse the wound with Normal Saline prior to applying a clean dressing using gauze sponges, not tissue or cotton balls. Cleanser: Wound Cleanser (Generic) 3 x Per Week/15 Days Discharge Instructions: Cleanse the wound with wound cleanser prior to applying a clean dressing using gauze sponges, not tissue or cotton balls. Peri-Wound Care: Skin Prep (Generic) 3 x Per Week/15 Days Discharge Instructions: Use skin prep as directed Prim Dressing: Endoform 2x2 in (DME) (Generic) 3 x Per Week/15 Days ary Discharge Instructions: Moisten with saline Secondary Dressing: Woven Gauze Sponge, Non-Sterile 4x4 in (DME) (Generic) 3 x Per Week/15 Days Discharge Instructions: In clinic Apply over primary dressing as directed. Secondary  Dressing: Zetuvit Plus Silicone Border Dressing 4x4 (in/in) (DME) (Generic) 3 x Per Week/15 Days Discharge Instructions: Apply silicone border over primary dressing as directed. 1. Based on what I see I would recommend that we continue with the wound care measures as before specifically with regard to the endoform which I think is doing a great job. 2. I am also going to recommend that we have the patient continue to monitor for any evidence of overall infection. Obviously if anything changes patient can contact the office let me know but otherwise based on what we see right now I think that we are definitely headed in the appropriate direction anxious can be a matter of time to get this to fill in. We will see patient back for reevaluation in 1 week here in the clinic. If anything worsens or changes patient will contact our office for additional recommendations. Electronic Signature(s) Signed: 05/26/2022 12:52:34 PM By: Worthy Keeler PA-C Entered By: Worthy Keeler on 05/26/2022 12:52:33 [name], [name] ([id number]) [id number] Page 7  of 7 -------------------------------------------------------------------------------- SuperBill Details Patient Name: Date of Service: [name], [name] 05/26/2022 Medical Record Number: [id number] Patient Account Number: [id number] Date of Birth/Sex: Treating RN: Jan 31, 1946 (76 y.o. 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 I10 Essential (primary) hypertension I25.10 Atherosclerotic heart disease of native coronary artery without angina pectoris Facility Procedures : CPT4 Code: 25427062 Description: 99213 - WOUND CARE VISIT-LEV 3 EST PT Modifier: Quantity: 1 Physician Procedures : CPT4 Code Description Modifier [id number] - WC PHYS LEVEL 3 - EST PT ICD-10 Diagnosis Description T81.31XA Disruption of external operation (surgical) wound, not elsewhere classified, initial encounter L98.492 Non-pressure chronic ulcer of skin of  other sites with fat layer exposed 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

## 2022-05-27 ENCOUNTER — Telehealth (HOSPITAL_COMMUNITY): Payer: Self-pay | Admitting: *Deleted

## 2022-05-27 NOTE — Telephone Encounter (Signed)
Reaching out to patient to offer assistance regarding upcoming cardiac imaging study; pt verbalizes understanding of appt date/time, parking situation and where to check in, , and verified current allergies; name and call back number provided for further questions should they arise  Gordy Clement RN Navigator Cardiac Imaging [facility] [phone] office [phone] cell  Patient to take '100mg'$  metoprolol tartrate two hours prior to her cardiac CT scan.  She is aware to arrive at 3pm.

## 2022-05-28 ENCOUNTER — Ambulatory Visit (HOSPITAL_COMMUNITY)
Admission: RE | Admit: 2022-05-28 | Discharge: 2022-05-28 | Disposition: A | Discharge status: Home / Self Care | Payer: Medicare HMO | Source: Ambulatory Visit | Attending: Cardiology | Admitting: Cardiology

## 2022-05-28 ENCOUNTER — Telehealth: Payer: Self-pay | Admitting: Cardiology

## 2022-05-28 ENCOUNTER — Ambulatory Visit: Payer: Medicare HMO | Admitting: Cardiology

## 2022-05-28 DIAGNOSIS — R072 Precordial pain: Secondary | ICD-10-CM | POA: Diagnosis present

## 2022-05-28 DIAGNOSIS — R931 Abnormal findings on diagnostic imaging of heart and coronary circulation: Secondary | ICD-10-CM | POA: Insufficient documentation

## 2022-05-28 DIAGNOSIS — E785 Hyperlipidemia, unspecified: Secondary | ICD-10-CM | POA: Diagnosis present

## 2022-05-28 DIAGNOSIS — I2510 Atherosclerotic heart disease of native coronary artery without angina pectoris: Secondary | ICD-10-CM | POA: Diagnosis not present

## 2022-05-28 MED ORDER — NITROGLYCERIN 0.4 MG SL SUBL
SUBLINGUAL_TABLET | SUBLINGUAL | Status: AC
  Filled 2022-05-28: qty 2

## 2022-05-28 MED ORDER — NITROGLYCERIN 0.4 MG SL SUBL
0.8000 mg | SUBLINGUAL_TABLET | Freq: Once | SUBLINGUAL | Status: AC
  Administered 2022-05-28: 0.8 mg via SUBLINGUAL

## 2022-05-28 MED ORDER — IOHEXOL 350 MG/ML SOLN
100.0000 mL | Freq: Once | INTRAVENOUS | Status: AC | PRN
  Administered 2022-05-28: 100 mL via INTRAVENOUS

## 2022-05-28 NOTE — Telephone Encounter (Signed)
error 

## 2022-05-29 ENCOUNTER — Ambulatory Visit (HOSPITAL_BASED_OUTPATIENT_CLINIC_OR_DEPARTMENT_OTHER)
Admission: RE | Admit: 2022-05-29 | Discharge: 2022-05-29 | Disposition: A | Discharge status: Home / Self Care | Payer: Medicare HMO | Source: Ambulatory Visit | Attending: Cardiology | Admitting: Cardiology

## 2022-05-29 DIAGNOSIS — R931 Abnormal findings on diagnostic imaging of heart and coronary circulation: Secondary | ICD-10-CM

## 2022-05-31 ENCOUNTER — Other Ambulatory Visit: Payer: Medicare HMO

## 2022-05-31 ENCOUNTER — Telehealth: Payer: Self-pay | Admitting: Cardiology

## 2022-05-31 ENCOUNTER — Inpatient Hospital Stay: Payer: Medicare HMO

## 2022-05-31 NOTE — Telephone Encounter (Signed)
    Primary Cardiologist: Minus Breeding, MD  Chart reviewed as part of pre-operative protocol coverage. Simple dental extractions are considered low risk procedures per guidelines and generally do not require any specific cardiac clearance. It is also generally accepted that for simple extractions and dental cleanings, there is no need to interrupt blood thinner therapy.   SBE prophylaxis is not required for the patient.  I will route this recommendation to the requesting party via Epic fax function and remove from pre-op pool.  Please call with questions.  Deberah Pelton, NP 05/31/2022, 1:04 PM

## 2022-05-31 NOTE — Telephone Encounter (Signed)
   Pre-operative Risk Assessment    Patient Name: [name]  DOB: 1945/12/13 MRN: [id number]     Request for Surgical Clearance    Procedure:  Dental Extraction for 1 tooth  Date of Surgery:  06-04-22                    Surgeon:  Dr Raj Janus, DDS Surgeon's Group or Practice Name:   Phone number:  [phone] Fax number:  [phone]   Type of Clearance Requested:   - Medical  - Pharmacy:  Hold Warfarin (Coumadin)     Type of Anesthesia:  Local    Additional requests/questions:    Lorin Glass   05/31/2022, 10:20 AM

## 2022-06-01 NOTE — Progress Notes (Signed)
SAREEN, RANDON ([id number]) [id number] Page 1 of 7 Visit Report for 05/26/2022 Arrival Information Details Patient Name: Date of Service: EBONEE, STOBER 05/26/2022 11:30 A M Medical Record Number: [id number] Patient Account Number: [id number] Date of Birth/Sex: Treating RN: 1946/05/16 (76 y.o. [name], [name] Primary Care Governor Matos: [name] Other Clinician: Referring Zetta Stoneman: Treating Mable Lashley/Extender: [name] in Treatment: 72 Visit Information History Since Last Visit All ordered tests and consults were completed: No Patient Arrived: Ambulatory Added or deleted any medications: No Arrival Time: 11:50 Any new allergies or adverse reactions: No Transfer Assistance: None Had a fall or experienced change in No Patient Identification Verified: Yes activities of daily living that may affect Secondary Verification Process Completed: Yes risk of falls: Patient Requires Transmission-Based Precautions: No Signs or symptoms of abuse/neglect since last visito No Patient Has Alerts: Yes Hospitalized since last visit: No Patient Alerts: Patient on Blood Thinner Implantable device outside of the clinic excluding No cellular tissue based products placed in the center since last visit: Pain Present Now: No Electronic Signature(s) Signed: 05/26/2022 1:12:45 PM By: Worthy Rancher Entered By: Worthy Rancher on 05/26/2022 11:50:19 -------------------------------------------------------------------------------- Clinic Level of Care Assessment Details Patient Name: Date of Service: BRISEIDA, GITTINGS 05/26/2022 11:30 A M Medical Record Number: [id number] Patient Account Number: [id number] Date of Birth/Sex: Treating RN: March 24, 1946 (76 y.o. [name], [name] Primary Care Stan Cantave: [name] Other Clinician: Referring Crews Mccollam: Treating Craigory Toste/Extender: [name] in Treatment: 17 Clinic  Level of Care Assessment Items TOOL 4 Quantity Score X- 1 0 Use when only an EandM is performed on FOLLOW-UP visit ASSESSMENTS - Nursing Assessment / Reassessment X- 1 10 Reassessment of Co-morbidities (includes updates in patient status) X- 1 5 Reassessment of Adherence to Treatment Plan ASSESSMENTS - Wound and Skin A ssessment / Reassessment X - Simple Wound Assessment / Reassessment - one wound 1 5 _0  - 0 Complex Wound Assessment / Reassessment - multiple wounds _1  - 0 Dermatologic / Skin Assessment (not related to wound area) ASSESSMENTS - Focused Assessment _2  - 0 Circumferential Edema Measurements - multi extremities _3  - 0 Nutritional Assessment / Counseling / Intervention MERIEL, KELLIHER ([id number]) [id number] Page 2 of 7 _4  - 0 Lower Extremity Assessment (monofilament, tuning fork, pulses) _5  - 0 Peripheral Arterial Disease Assessment (using hand held doppler) ASSESSMENTS - Ostomy and/or Continence Assessment and Care _6  - 0 Incontinence Assessment and Management _7  - 0 Ostomy Care Assessment and Management (repouching, etc.) PROCESS - Coordination of Care X - Simple Patient / Family Education for ongoing care 1 15 _8  - 0 Complex (extensive) Patient / Family Education for ongoing care X- 1 10 Staff obtains Programmer, systems, Records, T Results / Process Orders est _9  - 0 Staff telephones HHA, Nursing Homes / Clarify orders / etc _10  - 0 Routine Transfer to another Facility (non-emergent condition) _11  - 0 Routine Hospital Admission (non-emergent condition) _12  - 0 New Admissions / Biomedical engineer / Ordering NPWT Apligraf, etc. , _13  - 0 Emergency Hospital Admission (emergent condition) X- 1 10 Simple Discharge Coordination _14  - 0 Complex (extensive) Discharge Coordination PROCESS - Special Needs _15  - 0 Pediatric / Minor Patient Management _16  - 0 Isolation Patient Management _17  - 0 Hearing / Language / Visual special needs _18   - 0 Assessment of Community assistance (transportation, D/C planning, etc.) _19  - 0 Additional assistance / Altered mentation _20  - 0 Support Surface(s) Assessment (bed, cushion, seat, etc.) INTERVENTIONS - Wound Cleansing / Measurement X -  Simple Wound Cleansing - one wound 1 5 _0  - 0 Complex Wound Cleansing - multiple wounds X- 1 5 Wound Imaging (photographs - any number of wounds) _1  - 0 Wound Tracing (instead of photographs) X- 1 5 Simple Wound Measurement - one wound _2  - 0 Complex Wound Measurement - multiple wounds INTERVENTIONS - Wound Dressings X - Small Wound Dressing one or multiple wounds 1 10 _3  - 0 Medium Wound Dressing one or multiple wounds _4  - 0 Large Wound Dressing one or multiple wounds <NGEXBMWUXLKGMWNU>_2<\/VOZDGUYQIHKVQQVZ>_5  - 0 Application of Medications - topical <GLOVFIEPPIRJJOAC>_1<\/YSAYTKZSWFUXNATF>_5  - 0 Application of Medications - injection INTERVENTIONS - Miscellaneous _7  - 0 External ear exam _8  - 0 Specimen Collection (cultures, biopsies, blood, body fluids, etc.) _9  - 0 Specimen(s) / Culture(s) sent or taken to Lab for analysis _10  - 0 Patient Transfer (multiple staff / Civil Service fast streamer / Similar devices) _11  - 0 Simple Staple / Suture removal (25 or less) _12  - 0 Complex Staple / Suture removal (26 or more) _13  - 0 Hypo / Hyperglycemic Management (close monitor of Blood Glucose) Musolf, Astin P ([id number]) [id number] Page 3 of 7 _14  - 0 Ankle / Brachial Index (ABI) - do not check if billed separately X- 1 5 Vital Signs Has the patient been seen at the hospital within the last three years: Yes Total Score: 85 Level Of Care: New/Established - Level 3 Electronic Signature(s) Signed: 06/01/2022 3:49:22 PM By: Rhae Hammock RN Entered By: Rhae Hammock on 05/26/2022 12:18:59 -------------------------------------------------------------------------------- Encounter Discharge Information Details Patient Name: Date of Service: [name]. 05/26/2022 11:30 A M Medical Record  Number: [id number] Patient Account Number: [id number] Date of Birth/Sex: Treating RN: 26-Feb-1946 (76 y.o. [name], [name] Primary Care Hommer Cunliffe: [name] Other Clinician: Referring Loise Esguerra: Treating Eulogia Dismore/Extender: [name] in Treatment: 17 Encounter Discharge Information Items Discharge Condition: Stable Ambulatory Status: Ambulatory Discharge Destination: Home Transportation: Private Auto Accompanied By: self Schedule Follow-up Appointment: Yes Clinical Summary of Care: Patient Declined Electronic Signature(s) Signed: 06/01/2022 3:49:22 PM By: Rhae Hammock RN Entered By: Rhae Hammock on 05/26/2022 12:19:48 -------------------------------------------------------------------------------- Lower Extremity Assessment Details Patient Name: Date of Service: [name]. 05/26/2022 11:30 A M Medical Record Number: [id number] Patient Account Number: [id number] Date of Birth/Sex: Treating RN: 04-Mar-1946 (76 y.o. [name], [name] Primary Care Iyannah Blake: [name] Other Clinician: Referring Brynnley Dayrit: Treating Violet Cart/Extender: [name] in Treatment: 17 Electronic Signature(s) Signed: 05/27/2022 11:40:36 AM By: Erenest Blank Signed: 06/01/2022 3:49:22 PM By: Rhae Hammock RN Entered By: Erenest Blank on 05/26/2022 12:09:36 -------------------------------------------------------------------------------- Multi-Disciplinary Care Plan Details Patient Name: Date of Service: LAURIE, PENADO 05/26/2022 11:30 A Ponciano Ort ([id number]) [id number] Page 4 of 7 Medical Record Number: [id number] Patient Account Number: [id number] Date of Birth/Sex: Treating RN: 1945/10/14 (76 y.o. [name], [name] Primary Care Vernel Langenderfer: [name] Other Clinician: Referring Aishwarya Shiplett: Treating Carmelo Reidel/Extender: [name] in Treatment:  17 Active Inactive Wound/Skin Impairment Nursing Diagnoses: Impaired tissue integrity Knowledge deficit related to ulceration/compromised skin integrity Goals: Patient will have a decrease in wound volume by X% from date: (specify in notes) Date Initiated: 01/27/2022 Target Resolution Date: 06/26/2022 Goal Status: Active Patient/caregiver will verbalize understanding of skin care regimen Date Initiated: 01/27/2022 Date Inactivated: 05/05/2022 Target Resolution Date: 04/23/2022 Goal Status: Met Ulcer/skin breakdown will have a volume reduction of 30% by week 4 Date Initiated: 01/27/2022 Target Resolution Date: 06/26/2022 Goal Status: Active Interventions: Assess patient/caregiver ability to obtain necessary supplies Assess patient/caregiver ability  to perform ulcer/skin care regimen upon admission and as needed Assess ulceration(s) every visit Notes: Electronic Signature(s) Signed: 06/01/2022 3:49:22 PM By: Rhae Hammock RN Entered By: Rhae Hammock on 05/26/2022 12:17:06 -------------------------------------------------------------------------------- Pain Assessment Details Patient Name: Date of Service: [name]. 05/26/2022 11:30 A M Medical Record Number: [id number] Patient Account Number: [id number] Date of Birth/Sex: Treating RN: 04/11/1946 (76 y.o. [name], [name] Primary Care Lakyia Behe: [name] Other Clinician: Referring Merryl Buckels: Treating Pao Haffey/Extender: [name] in Treatment: 17 Active Problems Location of Pain Severity and Description of Pain Patient Has Paino No Site Locations REES, MATURA P ([id number]) [id number] Page 5 of 7 Pain Management and Medication Current Pain Management: Electronic Signature(s) Signed: 05/26/2022 1:12:45 PM By: Worthy Rancher Signed: 06/01/2022 3:49:22 PM By: Rhae Hammock RN Entered By: Worthy Rancher on 05/26/2022  11:51:00 -------------------------------------------------------------------------------- Patient/Caregiver Education Details Patient Name: Date of Service: [name] 11/1/2023andnbsp11:30 A M Medical Record Number: [id number] Patient Account Number: [id number] Date of Birth/Gender: Treating RN: 08-Nov-1945 (76 y.o. [name], [name] Primary Care Physician: [name] Other Clinician: Referring Physician: Treating Physician/Extender: [name] in Treatment: 17 Education Assessment Education Provided To: Patient Education Topics Provided Wound/Skin Impairment: Methods: Explain/Verbal Responses: Reinforcements needed, State content correctly Electronic Signature(s) Signed: 06/01/2022 3:49:22 PM By: Rhae Hammock RN Entered By: Rhae Hammock on 05/26/2022 12:17:34 -------------------------------------------------------------------------------- Wound Assessment Details Patient Name: Date of Service: [name]. 05/26/2022 11:30 A M Medical Record Number: [id number] Patient Account Number: [id number] Date of Birth/Sex: Treating RN: 09-Nov-1945 (76 y.o. [name] Primary Care Eligha Kmetz: [name] Other Clinician: DAYLAN, BOGGESS ([id number]) [id number] Page 6 of 7 Referring Thaddeus Evitts: Treating Mitsy Owen/Extender: Renae Fickle Weeks in Treatment: 17 Wound Status Wound Number: 1 Primary Open Surgical Wound Etiology: Wound Location: Right Breast Secondary Calciphylaxis Wounding Event: Surgical Injury Etiology: Date Acquired: 08/26/2021 Wound Open Weeks Of Treatment: 17 Status: Clustered Wound: No Comorbid Asthma, Deep Vein Thrombosis, Hypertension, Peripheral Arterial History: Disease, Hepatitis B, Osteoarthritis, Neuropathy, Received Radiation Photos Wound Measurements Length: (cm) 0.4 Width: (cm) 0.3 Depth: (cm) 0.7 Area: (cm) 0.094 Volume: (cm) 0.066 %  Reduction in Area: -32.4% % Reduction in Volume: 7% Epithelialization: Small (1-33%) Tunneling: Yes Position (o'clock): 10 Maximum Distance: (cm) 1.7 Undermining: No Wound Description Classification: Full Thickness With Exposed Suppo Wound Margin: Distinct, outline attached Exudate Amount: Large Exudate Type: Serosanguineous Exudate Color: red, brown rt Structures Foul Odor After Cleansing: Yes Due to Product Use: Yes Slough/Fibrino Yes Wound Bed Granulation Amount: Large (67-100%) Exposed Structure Granulation Quality: Red Fascia Exposed: No Necrotic Amount: Small (1-33%) Fat Layer (Subcutaneous Tissue) Exposed: Yes Necrotic Quality: Adherent Slough Tendon Exposed: No Muscle Exposed: No Joint Exposed: No Bone Exposed: No Periwound Skin Texture Texture Color No Abnormalities Noted: Yes No Abnormalities Noted: Yes Moisture Temperature / Pain No Abnormalities Noted: Yes Temperature: No Abnormality Treatment Notes Wound #1 (Breast) Wound Laterality: Right Cleanser Normal Saline Discharge Instruction: Cleanse the wound with Normal Saline prior to applying a clean dressing using gauze sponges, not tissue or cotton balls. Wound Cleanser Discharge Instruction: Cleanse the wound with wound cleanser prior to applying a clean dressing using gauze sponges, not tissue or cotton balls. Peri-Wound Care Skin Prep KIKI, BIVENS ([id number]) [id number] Page 7 of 7 Discharge Instruction: Use skin prep as directed Topical Primary Dressing Endoform 2x2 in Discharge Instruction: Moisten with saline Secondary Dressing Woven Gauze Sponge, Non-Sterile 4x4 in Discharge Instruction: In clinic Apply over primary dressing as directed. Zetuvit Plus  Silicone Border Dressing 4x4 (in/in) Discharge Instruction: Apply silicone border over primary dressing as directed. Secured With Compression Wrap Compression Stockings Environmental education officer) Signed:  05/27/2022 11:40:36 AM By: Erenest Blank Signed: 06/01/2022 3:49:22 PM By: Rhae Hammock RN Entered By: Erenest Blank on 05/26/2022 12:15:21 -------------------------------------------------------------------------------- Vitals Details Patient Name: Date of Service: [name]. 05/26/2022 11:30 A M Medical Record Number: [id number] Patient Account Number: [id number] Date of Birth/Sex: Treating RN: 09/02/1945 (76 y.o. [name], [name] Primary Care Rontae Inglett: [name] Other Clinician: Referring Macyn Remmert: Treating Giannie Soliday/Extender: [name] in Treatment: 17 Vital Signs Time Taken: 11:45 Temperature (F): 97.8 Height (in): 64 Pulse (bpm): 84 Weight (lbs): 180 Respiratory Rate (breaths/min): 20 Body Mass Index (BMI): 30.9 Blood Pressure (mmHg): 115/72 Reference Range: 80 - 120 mg / dl Electronic Signature(s) Signed: 05/26/2022 1:12:45 PM By: Worthy Rancher Entered By: Worthy Rancher on 05/26/2022 11:50:49

## 2022-06-02 ENCOUNTER — Encounter: Payer: Self-pay | Admitting: *Deleted

## 2022-06-09 ENCOUNTER — Encounter (HOSPITAL_BASED_OUTPATIENT_CLINIC_OR_DEPARTMENT_OTHER): Payer: Medicare HMO | Admitting: Physician Assistant

## 2022-06-15 ENCOUNTER — Ambulatory Visit: Payer: Medicare HMO | Attending: Internal Medicine | Admitting: Pharmacist Clinician (PhC)/ Clinical Pharmacy Specialist

## 2022-06-15 ENCOUNTER — Encounter: Payer: Self-pay | Admitting: Pharmacist Clinician (PhC)/ Clinical Pharmacy Specialist

## 2022-06-15 DIAGNOSIS — E785 Hyperlipidemia, unspecified: Secondary | ICD-10-CM | POA: Diagnosis not present

## 2022-06-15 MED ORDER — REPATHA PUSHTRONEX SYSTEM 420 MG/3.5ML ~~LOC~~ SOCT: 420.0000 mg | SUBCUTANEOUS | 0 refills | Status: DC

## 2022-06-15 NOTE — Patient Instructions (Addendum)
Your Results:             Your most recent labs Goal  Total Cholesterol 167 < 200  Triglycerides 129 < 150  HDL (happy/good cholesterol) 38 > 40  LDL (lousy/bad cholesterol 103 < 70   Medication changes:  We will start the process to get Repatha covered by your insurance.    We will do the PA the first week of January.  Once the prior authorization is complete, I will call you to let you know and confirm pharmacy information.   You will use 1 "pod" every month.    Lab orders:  We want to repeat labs after 2-3 months.  We will send you a lab order to remind you once we get closer to that time.     Thank you for choosing [facility]    PLEASE CALL [facility] TO CLARIFY YOUR SSN  call [phone].  Pharmacy Card CARD NO. [id number]   CARD STATUS Active   BIN [id number]   PCN [id number]   PC GROUP [id number]   HELP DESK [phone]   PROVIDER PDMI   PROCESSOR PDMI

## 2022-06-15 NOTE — Assessment & Plan Note (Addendum)
Patient with history of ASCVD and LDL cholesterol not to goal.  Has been unable to tolerate multiple statin drugs due to myalgias.  Reviewed options for lowering LDL cholesterol, including ezetimibe, PCSK-9 inhibitors, bempedoic acid and inclisiran.  Discussed mechanisms of action, dosing, side effects and potential decreases in LDL cholesterol.  Also reviewed cost information and potential options for patient assistance.  Answered all patient questions.  Based on this information, patient would prefer to start PCSK9 inhibitor.  Because her insurance Scientist, clinical (histocompatibility and immunogenetics)) is switching from [employer] to [employer] on Jan 1 as preferred product, we will wait until Jan 1 to do the Prior Auth.  Until then she was given sample of the Pushtronix to use in early December.   Will repeat labs after 3 doses, and will mail lab orders to patient at that time.

## 2022-06-15 NOTE — Progress Notes (Unsigned)
Office Visit    Patient Name: [name] Date of Encounter: 06/15/2022  Primary Care Provider:  [name]., PA-C Primary Cardiologist:  Minus Breeding, MD  Chief Complaint    Hyperlipidemia - statin intolerant  Past Medical History   Carotid stenosis 9/22 right carotid endarterectomy, patent on most recent scan  CAD Coronary calcium score 432 (85th percentile)  CVA History of TiA  thyroidectomy On levothyroxine  hypercoagulable Positive for antiphospholipid antibody and lupus anticoagulant syndromes  - on warfarin  Pre-diabetes 6/23 A1c 6.4     Allergies  Allergen Reactions   Bee Venom Anaphylaxis   Hornet Venom Anaphylaxis   Reglan [Metoclopramide] Shortness Of Breath and Other (See Comments)    Tongue got numb; didn't feel good   Vitamin K Anaphylaxis and Rash    "kills me" iv   Baclofen     Dizziness   Cephalexin Other (See Comments)   Benadryl [Diphenhydramine Hcl] Rash   Ciprofloxacin Rash   Crestor [Rosuvastatin Calcium] Other (See Comments)    Muscle Aches   Cymbalta [Duloxetine Hcl] Swelling    Swelling of tongue and thrush. Does not sleep and cramps   Daypro [Oxaprozin] Rash   Doxycycline Other (See Comments)    Tongue burning, thrush   Levofloxacin Rash   Sulfonamide Derivatives Rash   Zocor [Simvastatin] Other (See Comments)    Muscle Aches    History of Present Illness    Patient with history of statin intolerance and ASCVD in the office today to discuss options for cholesterol lowering.  She was seen by Dr. Percival Spanish last month, referred to the office for symptoms of chest pain.  A coronary calcium screen showed her to have a calcium score of 432.  She also has history of carotid endarterectomy in 2022  Insurance Carrier: [employer]  LDL Cholesterol goal:  LDL < 70  Current  Lipid Medications:  none  Intolerances: pravastatin, atorvastatin, rosuvastatin, simvastatin - myalgias  Family Hx:   mother with 2  stents, died CHF at 22; father died at 54 (froze to death); sister had MI x 2, brother with MI x 2, pacemaker; son and daughter both have bone disease - calcium buildup)  Social Hx: no tobacco, no alcohol      Diet:  recent mouth surgery, so not able to eat as much recently, mostly home cooked, some eating out to get healthier foods     Accessory Clinical Findings    Lab Results  Component Value Date   ALT 20 09/14/2021   AST 25 09/14/2021   ALKPHOS 73 09/14/2021   BILITOT 0.7 09/14/2021   Lab Results  Component Value Date   CHOL 167 04/24/2021   HDL 38 (L) 04/24/2021   LDLCALC 103 (H) 04/24/2021   TRIG 129 04/24/2021   CHOLHDL 4.4 04/24/2021    Lab Results  Component Value Date   HGBA1C 6.3 (H) 06/28/2017    Home Medications    Current Outpatient Medications  Medication Sig Dispense Refill   ALPRAZolam (XANAX) 1 MG tablet Take 1 mg by mouth 2 (two) times daily.      Ascorbic Acid (VITAMIN C) 1000 MG tablet Take 1,000 mg by mouth daily.     aspirin 81 MG tablet Take 81 mg by mouth every evening.     benzocaine-menthol (CHLORAEPTIC) 6-10 MG lozenge Take 1 lozenge by mouth as needed for sore throat. 45 tablet 0   Calcium Carb-Cholecalciferol (CALCIUM-VITAMIN D) 600-400 MG-UNIT TABS Take 1  tablet by mouth daily.     Cyanocobalamin (VITAMIN B 12) 500 MCG TABS Take 500 mcg by mouth daily.     EPINEPHrine 0.3 mg/0.3 mL IJ SOAJ injection Inject 0.3 mg into the muscle as needed for anaphylaxis. 1 each 0   fluticasone (FLONASE) 50 MCG/ACT nasal spray Place 2 sprays into the nose daily as needed for allergies.      HYDROcodone-acetaminophen (NORCO/VICODIN) 5-325 MG tablet Take 1 tablet by mouth every 6 (six) hours as needed for moderate pain. 15 tablet 0   levothyroxine (SYNTHROID, LEVOTHROID) 112 MCG tablet Take 112 mcg by mouth See admin instructions. Take 112 mcg by mouth daily on Sun, Tues, Wed, Thurs, Sat     levothyroxine (SYNTHROID, LEVOTHROID) 125 MCG tablet Take 125 mcg by  mouth See admin instructions. Take 125 mcg by mouth on Monday and Friday     lidocaine (XYLOCAINE) 2 % solution Use as directed 15 mLs in the mouth or throat as needed for mouth pain. 100 mL 0   magnesium oxide (MAG-OX) 400 MG tablet Take 400 mg by mouth daily.     metoprolol tartrate (LOPRESSOR) 100 MG tablet Take 100 mg 2 hours before Coronary CT 1 tablet 0   metoprolol tartrate (LOPRESSOR) 25 MG tablet Take 12.5 mg by mouth 2 (two) times daily.     omeprazole (PRILOSEC) 20 MG capsule Take 20 mg by mouth daily.      pravastatin (PRAVACHOL) 80 MG tablet Take 80 mg by mouth daily.     sertraline (ZOLOFT) 100 MG tablet Take 100 mg by mouth daily.     warfarin (COUMADIN) 5 MG tablet Take 5 mg daily except on Mondays: take 2.5 mg (1/2 tablet) 90 tablet 3   No current facility-administered medications for this visit.     Assessment & Plan     Hyperlipidemia Patient with history of ASCVD and LDL cholesterol not to goal.  Has been unable to tolerate multiple statin drugs due to myalgias.  Reviewed options for lowering LDL cholesterol, including ezetimibe, PCSK-9 inhibitors, bempedoic acid and inclisiran.  Discussed mechanisms of action, dosing, side effects and potential decreases in LDL cholesterol.  Also reviewed cost information and potential options for patient assistance.  Answered all patient questions.  Based on this information, patient would prefer to start PCSK9 inhibitor.  Because her insurance Scient[employer], clinical (histocompatibility and immunogenetics)) is switching from [employer] to [employer] on Jan 1 as preferred product, we will wait until Jan 1 to do the Prior Auth.  Until then she was given sample of the Pushtronix to use in early December.   Will repeat labs after 3 doses, and will mail lab orders to patient at that time.     Tommy Medal, PharmD CPP [facility] 06/15/2022, 2:11 PM

## 2022-06-16 ENCOUNTER — Encounter (HOSPITAL_BASED_OUTPATIENT_CLINIC_OR_DEPARTMENT_OTHER): Payer: Medicare HMO | Admitting: Physician Assistant

## 2022-06-16 ENCOUNTER — Telehealth: Payer: Self-pay | Admitting: Cardiology

## 2022-06-16 DIAGNOSIS — L98492 Non-pressure chronic ulcer of skin of other sites with fat layer exposed: Secondary | ICD-10-CM | POA: Diagnosis not present

## 2022-06-16 NOTE — Telephone Encounter (Signed)
Pt c/o medication issue:  1. Name of Medication:   Evolocumab with Infusor ([place]) 420 MG/3.5ML SOCT    2. How are you currently taking this medication (dosage and times per day)?   Inject 420 mg into the skin every 30 (thirty) days.    3. Are you having a reaction (difficulty breathing--STAT)? No  4. What is your medication issue? Pt states that she would like to come by the office today to get another one. She states that it is not working. Please advise

## 2022-06-16 NOTE — Telephone Encounter (Signed)
Patient stopped by office, she was given another Pushtronix and we did the infusion in the office.  Answered all questions.

## 2022-06-16 NOTE — Progress Notes (Addendum)
[name], [name] ([id number]) [phone].pdf Page 1 of 7 Visit Report for 06/16/2022 Chief Complaint Document Details Patient Name: Date of Service: [name], [name] 06/16/2022 1:15 PM Medical Record Number: [id number] Patient Account Number: [id number] Date of Birth/Sex: Treating RN: 08-02-1945 (76 y.o. F) Primary Care Provider: [name] Other Clinician: Referring Provider: Treating Provider/Extender: [name] in Treatment: 20 Information Obtained from: Patient Chief Complaint Right breast ulcer Electronic Signature(s) Signed: 06/16/2022 1:33:07 PM By: Worthy Keeler PA-C Entered By: Worthy Keeler on 06/16/2022 13:33:06 -------------------------------------------------------------------------------- HPI Details Patient Name: Date of Service: LAJOY, [name] 06/16/2022 1:15 PM Medical Record Number: [id number] Patient Account Number: [id number] Date of Birth/Sex: Treating RN: [birth year] (76 y.o. F) Primary Care Provider: [name] Other Clinician: Referring Provider: Treating Provider/Extender: [name] in Treatment: 20 History of Present Illness HPI Description: 01-27-2022 upon evaluation today patient presents for initial evaluation here in our clinic concerning issues she has been having with her right breast. She had an area of calcium buildup where she had actual staples that were being pushed out that were noted from her previous breast surgery which was roughly 23 years ago when she had cancer. At that point she had also undergone radiation therapy. 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 Nonetheless after her most recent surgery in February to remove the staples and the calcium deposit this has started to build back up due to the damage to the breast tissue. She was seen initially by Dr. Brantley Stage. Eventually he referred her to Dr.  Rebekah Chesterfield to see about a mastectomy to take care of the issue. With that being said this was something that was good to be reserved just for worst-case scenario in fact they were a little bit concerned about her reaction considering her blood thinners and the fact that she may not do as well and surgery is what they would like to see with all the other medical problems that she has. For that reason they referred her to [name] in order to see what we can do from a healing standpoint. 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 02-03-2022 upon evaluation today patient appears to be doing okay in regard to her wound on the breast. Fortunately I do not see any signs of worsening. Unfortunately I do believe that she is still having some discomfort also there was some misunderstanding about how to do the dressing which I think will make it a little bit easier she should be putting it in before waiting it and then after its in and situated she can wet this. I think that is going to be a lot easier for her than what she has been trying to do. 02-10-2022 upon evaluation today patient appears to be doing about the same in regard to her wound. She has been tolerating the dressing changes without complication. We are actually starting her on linezolid today actually had to apply to get this approved by her insurance I am hopeful that this will be affordable for her now it was going to cost her way too much before. With that being said I do believe that she is  headed in the right direction which is great news. No fevers, chills, nausea, vomiting, or diarrhea. 7/26; right breast apparently secondary to surgery earlier  this year for protruding staples and underlying traumatic calcifications. She has a nonhealing wound with some depth and tunneling. She has been using Hydrofera Blue with some improvement in measurements today. Nevertheless she has a wound VAC and we went ahead and applied this. I am not certain whether she is going to be eligible for home health but her staff are certainly going to work on it 02-24-2022 upon evaluation today patient appears to be doing excellent she is using the gauze VAC at this point and it seems to be doing a great job for her. Fortunately there does not appear to be any signs of active infection at this time which is excellent. No fever or chills noted 03-10-2022 upon evaluation today patient appears to be doing well with regard to her wound although the wound was definitely over packed compared to what it [name], [name] ([id number]) [phone].pdf Page 2 of 7 needs to be. I think they were probably got to switch to [facility] Blue rope which I think would be much easier to put him than the current black foam which is what home health is actually using we will try to do a gauze VAC but to be honest there is not actually doing that. Fortunately there does not appear to be any signs of active infection locally or systemically at this time which is great news. I think the biggest issue I see is simply that we need to make sure and have the patient have the Hydrofera Blue rope which is again I think a much better way to go with this and avoid altogether the use of the black foam inside the wound and put a piece on the outside that is appropriate and fine. 03-17-2022 upon evaluation today patient appears to be doing well currently in regard to her wound. The wound VAC does seem to be helping the [facility] seems to be much better and very pleased in this regard. I do not see any signs of active infection locally or systemically at this time which is great news.  No fevers, chills, nausea, vomiting, or diarrhea. 03-24-2022 upon evaluation today patient appears to be doing well with regard to her wound. I am seeing signs of improvement which is great news. Fortunately she is not doing any worse unfortunately they did not have the supplies and her wound VAC on until yesterday but otherwise she is doing excellent. 03-31-2022 upon evaluation today patient appears to be doing well currently in regard to her wound. I do feel like the wound VAC is doing well we may be getting close to the point of not utilizing the wound VAC any longer the patient states that she would be happy to get to that point. Fortunately I do not see any evidence of active infection locally or systemically which is great news. 04-07-2022 upon evaluation today patient appears to be doing well currently in regard to the wound on her right breast area. Fortunately there does not appear to be any signs of infection at this time which is great news and overall I am extremely pleased with where we stand today. No fevers, chills, nausea, vomiting, or diarrhea. 04-21-2022 upon evaluation today patient appears to be doing well currently in regard to her wound. She has been tolerating the dressing changes with the Hydrofera Blue without complication and overall I am extremely  pleased with where things stand today. There does not appear to be any evidence of active infection locally or systemically at this time which is great news. No fevers, chills, nausea, vomiting, or diarrhea. I do believe that she is headed in the right direction here. 05-05-2022 upon evaluation today patient actually appears to be doing excellent in regard to her wound. This is showing signs of great improvement and very pleased with where we stand. I do not see any signs of infection locally or systemically at this time which is great news. No fevers, chills, nausea, vomiting, or diarrhea. 10/18; small open area in the right breast  but with 1.7 cm of depth. Surgical wound in the setting of remote radiation greater than 20 years ago. Switch to endoform last week 05-26-2022 upon evaluation today patient appears to be doing well currently in regard to the overall appearance. Fortunately I do not see any signs of active infection locally or systemically at this time which is great news. No fevers, chills, nausea, vomiting, or diarrhea. 06-16-2022 upon evaluation today patient appears to be doing well currently in regard to her wound. She has been making excellent progress. Fortunately there does not appear to be any signs of infection locally nor systemically at this time which is great news. No fevers, chills, nausea, vomiting, or diarrhea. Electronic Signature(s) Signed: 06/16/2022 1:45:09 PM By: Worthy Keeler PA-C Entered By: Worthy Keeler on 06/16/2022 13:45:09 -------------------------------------------------------------------------------- Physical Exam Details Patient Name: Date of Service: [name], [name] 06/16/2022 1:15 PM Medical Record Number: [id number] Patient Account Number: [id number] Date of Birth/Sex: Treating RN: 05-Jul-1946 (76 y.o. F) Primary Care Provider: [name] Other Clinician: Referring Provider: Treating Provider/Extender: Renae [name] in Treatment: 31 Constitutional Well-nourished and well-hydrated in no acute distress. Respiratory normal breathing without difficulty. Psychiatric this patient is able to make decisions and demonstrates good insight into disease process. Alert and Oriented x 3. pleasant and cooperative. Notes Upon inspection patient's wound bed actually showed signs of good granulation and epithelization at this point. There is just a very tiny area in the bottom of the wound where she has new skin growing on the sidewalls while this has not completely covered over. Fortunately think she is headed in the right direction and I think we are  very close to getting this completely resolved. Electronic Signature(s) Signed: 06/16/2022 1:45:32 PM By: Worthy Keeler PA-C Entered By: Worthy Keeler on 06/16/2022 13:45:32 Bommarito, [name] ([id number]) [id number] Page 3 of 7 -------------------------------------------------------------------------------- Physician Orders Details Patient Name: Date of Service: [name], [name] 06/16/2022 1:15 PM Medical Record Number: [id number] Patient Account Number: [id number] Date of Birth/Sex: Treating RN: [birth year] (76 y.o. [name], [name] Primary Care Provider: [name] Other Clinician: Referring Provider: Treating Provider/Extender: [name] in Treatment: 20 Verbal / Phone Orders: No Diagnosis Coding ICD-10 Coding Code Description T81.31XA Disruption of external operation (surgical) wound, not elsewhere classified, initial encounter L98.492 Non-pressure chronic ulcer of skin of other sites with fat layer exposed E83.59 Other disorders of calcium metabolism L59.8 Other specified disorders of the skin and subcutaneous tissue related to radiation D68.61 Antiphospholipid syndrome I10 Essential (primary) hypertension I25.10 Atherosclerotic heart disease of native coronary artery without angina pectoris Follow-up Appointments Return appointment in 3 [name]. - w/ Jeri Cos, Wednesday's Bathing/ Shower/ Hygiene May shower with protection but do not get wound dressing(s) wet. - May take dressing off and wash with antibacterial soap and water Negative Presssure Wound Therapy  Discontinue wound [facility] for wound care. Wound Treatment Wound #1 - Breast Wound Laterality: Right Cleanser: Normal Saline (Generic) 3 x Per Week/15 Days Discharge Instructions: Cleanse the wound with Normal Saline prior to applying a clean dressing using gauze sponges, not tissue or cotton balls. Cleanser: Wound  Cleanser (Generic) 3 x Per Week/15 Days Discharge Instructions: Cleanse the wound with wound cleanser prior to applying a clean dressing using gauze sponges, not tissue or cotton balls. Peri-Wound Care: Skin Prep (Generic) 3 x Per Week/15 Days Discharge Instructions: Use skin prep as directed Prim Dressing: Endoform 2x2 in (Generic) 3 x Per Week/15 Days ary Discharge Instructions: Moisten with saline Secondary Dressing: Woven Gauze Sponge, Non-Sterile 4x4 in (Generic) 3 x Per Week/15 Days Discharge Instructions: In clinic Apply over primary dressing as directed. Secondary Dressing: Zetuvit Plus Silicone Border Dressing 4x4 (in/in) (Generic) 3 x Per Week/15 Days Discharge Instructions: Apply silicone border over primary dressing as directed. Electronic Signature(s) Signed: 06/16/2022 2:30:20 PM By: Worthy Keeler PA-C Signed: 06/16/2022 3:58:15 PM By: Rhae Hammock RN Entered By: Rhae Hammock on 06/16/2022 13:43:10 [name], [name] ([id number]) [id number] Page 4 of 7 -------------------------------------------------------------------------------- Problem List Details Patient Name: Date of Service: [name], [name] 06/16/2022 1:15 PM Medical Record Number: [id number] Patient Account Number: [id number] Date of Birth/Sex: Treating RN: 1945-10-15 (76 y.o. F) Primary Care Provider: [name] Other Clinician: Referring Provider: Treating Provider/Extender: [name] in Treatment: 20 Active Problems ICD-10 Encounter Code Description Active Date MDM Diagnosis T81.31XA Disruption of external operation (surgical) wound, not elsewhere classified, 01/27/2022 No Yes initial encounter L98.492 Non-pressure chronic ulcer of skin of other sites with fat layer exposed 01/27/2022 No Yes E83.59 Other disorders of calcium metabolism 01/27/2022 No Yes L59.8 Other specified disorders of the skin and subcutaneous tissue related to  01/27/2022 No Yes radiation D68.61 Antiphospholipid syndrome 01/27/2022 No Yes I10 Essential (primary) hypertension 01/27/2022 No Yes I25.10 Atherosclerotic heart disease of native coronary artery without angina pectoris 01/27/2022 No Yes Inactive Problems Resolved Problems Electronic Signature(s) Signed: 06/16/2022 1:33:01 PM By: Worthy Keeler PA-C Entered By: Worthy Keeler on 06/16/2022 13:33:01 -------------------------------------------------------------------------------- Progress Note Details Patient Name: Date of Service: [name], [name] 06/16/2022 1:15 PM Medical Record Number: [id number] Patient Account Number: [id number] Date of Birth/Sex: Treating RN: [birth year] (76 y.o. F) Primary Care Provider: [name] Other Clinician: Referring Provider: Treating Provider/Extender: Regenia Skeeter, Chauncey Fischer in Treatment: [address] [place] ([id number]) [id number] Page 5 of 7 Subjective Chief Complaint Information obtained from Patient Right breast ulcer History of Present Illness (HPI) 01-27-2022 upon evaluation today patient presents for initial evaluation here in our clinic concerning issues she has been having with her right breast. She had an area of calcium buildup where she had actual staples that were being pushed out that were noted from her previous breast surgery which was roughly 23 years ago when she had cancer. At that point she had also undergone radiation therapy. 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 Nonetheless after her most recent surgery in February to remove the staples and the calcium deposit this has started to build back up due to the damage to the breast tissue. She was seen initially by Dr. Brantley Stage. Eventually he referred her to Dr. Rebekah Chesterfield to see about a mastectomy to take care of the issue. With that being said this was something that was good to be reserved just for  worst-case  scenario in fact they were a little bit concerned about her reaction considering her blood thinners and the fact that she may not do as well and surgery is what they would like to see with all the other medical problems that she has. For that reason they referred her to [name] in order to see what we can do from a healing standpoint. 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 02-03-2022 upon evaluation today patient appears to be doing okay in regard to her wound on the breast. Fortunately I do not see any signs of worsening. Unfortunately I do believe that she is still having some discomfort also there was some misunderstanding about how to do the dressing which I think will make it a little bit easier she should be putting it in before waiting it and then after its in and situated she can wet this. I think that is going to be a lot easier for her than what she has been trying to do. 02-10-2022 upon evaluation today patient appears to be doing about the same in regard to her wound. She has been tolerating the dressing changes without complication. We are actually starting her on linezolid today actually had to apply to get this approved by her insurance I am hopeful that this will be affordable for her now it was going to cost her way too much before. With that being said I do believe that she is headed in the right direction which is great news. No fevers, chills, nausea, vomiting, or diarrhea. 7/26; right breast apparently secondary to surgery earlier this year for protruding staples and underlying traumatic calcifications. She has a nonhealing wound with some depth and tunneling. She has  been using Hydrofera Blue with some improvement in measurements today. Nevertheless she has a wound VAC and we went ahead and applied this. I am not certain whether she is going to be eligible for home health but her staff are certainly going to work on it 02-24-2022 upon evaluation today patient appears to be doing excellent she is using the gauze VAC at this point and it seems to be doing a great job for her. Fortunately there does not appear to be any signs of active infection at this time which is excellent. No fever or chills noted 03-10-2022 upon evaluation today patient appears to be doing well with regard to her wound although the wound was definitely over packed compared to what it needs to be. I think they were probably got to switch to [facility] Blue rope which I think would be much easier to put him than the current black foam which is what home health is actually using we will try to do a gauze VAC but to be honest there is not actually doing that. Fortunately there does not appear to be any signs of active infection locally or systemically at this time which is great news. I think the biggest issue I see is simply that we need to make sure and have the patient have the Hydrofera Blue rope which is again I think a much better way to go with this and avoid altogether the use of the black foam inside the wound and put a piece on  the outside that is appropriate and fine. 03-17-2022 upon evaluation today patient appears to be doing well currently in regard to her wound. The wound VAC does seem to be helping the [facility] seems to be much better and very pleased in this regard. I do not see any signs of active infection locally or systemically at this time which is great news. No fevers, chills, nausea, vomiting, or diarrhea. 03-24-2022 upon evaluation today patient appears to be doing well with regard to her wound. I am seeing signs of improvement which is great news. Fortunately she is not  doing any worse unfortunately they did not have the supplies and her wound VAC on until yesterday but otherwise she is doing excellent. 03-31-2022 upon evaluation today patient appears to be doing well currently in regard to her wound. I do feel like the wound VAC is doing well we may be getting close to the point of not utilizing the wound VAC any longer the patient states that she would be happy to get to that point. Fortunately I do not see any evidence of active infection locally or systemically which is great news. 04-07-2022 upon evaluation today patient appears to be doing well currently in regard to the wound on her right breast area. Fortunately there does not appear to be any signs of infection at this time which is great news and overall I am extremely pleased with where we stand today. No fevers, chills, nausea, vomiting, or diarrhea. 04-21-2022 upon evaluation today patient appears to be doing well currently in regard to her wound. She has been tolerating the dressing changes with the [facility] without complication and overall I am extremely pleased with where things stand today. There does not appear to be any evidence of active infection locally or systemically at this time which is great news. No fevers, chills, nausea, vomiting, or diarrhea. I do believe that she is headed in the right direction here. 05-05-2022 upon evaluation today patient actually appears to be doing excellent in regard to her wound. This is showing signs of great improvement and very pleased with where we stand. I do not see any signs of infection locally or systemically at this time which is great news. No fevers, chills, nausea, vomiting, or diarrhea. 10/18; small open area in the right breast but with 1.7 cm of depth. Surgical wound in the setting of remote radiation greater than 20 years ago. Switch to endoform last week 05-26-2022 upon evaluation today patient appears to be doing well currently in regard to  the overall appearance. Fortunately I do not see any signs of active infection locally or systemically at this time which is great news. No fevers, chills, nausea, vomiting, or diarrhea. 06-16-2022 upon evaluation today patient appears to be doing well currently in regard to her wound. She has been making excellent progress. Fortunately there does not appear to be any signs of infection locally nor systemically at this time which is great news. No fevers, chills, nausea, vomiting, or diarrhea. Objective [name], [name] ([id number]) [id number] Page 6 of 7 Constitutional Well-nourished and well-hydrated in no acute distress. Vitals Time Taken: 1:23 PM, Height: 64 in, Weight: 180 lbs, BMI: 30.9, Temperature: 98.7 F, Pulse: 66 bpm, Respiratory Rate: 20 breaths/min, Blood Pressure: 172/97 mmHg. Respiratory normal breathing without difficulty. Psychiatric this patient is able to make decisions and demonstrates good insight into disease process. Alert and Oriented x 3. pleasant and cooperative. General Notes: Upon inspection patient's wound bed actually showed signs of good  granulation and epithelization at this point. There is just a very tiny area in the bottom of the wound where she has new skin growing on the sidewalls while this has not completely covered over. Fortunately think she is headed in the right direction and I think we are very close to getting this completely resolved. Integumentary (Hair, Skin) Wound #1 status is Open. Original cause of wound was Surgical Injury. The date acquired was: 08/26/2021. The wound has been in treatment 20 [name]. The wound is located on the Right Breast. The wound measures 0.6cm length x 0.2cm width x 0.7cm depth; 0.094cm^2 area and 0.066cm^3 volume. There is Fat Layer (Subcutaneous Tissue) exposed. There is no undermining noted, however, there is tunneling at 10:00 with a maximum distance of 0.9cm. There is a medium amount of  serosanguineous drainage noted. The wound margin is distinct with the outline attached to the wound base. There is large (67-100%) red granulation within the wound bed. There is no necrotic tissue within the wound bed. The periwound skin appearance had no abnormalities noted for texture. The periwound skin appearance had no abnormalities noted for moisture. The periwound skin appearance had no abnormalities noted for color. Periwound temperature was noted as No Abnormality. 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 - w/ Jeri Cos, Wednesday's Bathing/ Shower/ Hygiene: May shower with protection but do not get wound dressing(s) wet. - May take dressing off and wash with antibacterial soap and water Negative Presssure Wound Therapy: Discontinue wound vac Home Health: [facility] for wound care. WOUND #1: - Breast Wound Laterality: Right Cleanser: Normal Saline (Generic) 3 x Per Week/15 Days Discharge Instructions: Cleanse the wound with Normal Saline prior to applying a clean dressing using gauze sponges, not tissue or cotton balls. Cleanser: Wound Cleanser (Generic) 3 x Per Week/15 Days Discharge Instructions: Cleanse the wound with wound cleanser prior to applying a clean dressing using gauze sponges, not tissue or cotton balls. Peri-Wound Care: Skin Prep (Generic) 3 x Per Week/15 Days Discharge Instructions: Use skin prep as directed Prim Dressing: Endoform 2x2 in (Generic) 3 x Per Week/15  Days ary Discharge Instructions: Moisten with saline Secondary Dressing: Woven Gauze Sponge, Non-Sterile 4x4 in (Generic) 3 x Per Week/15 Days Discharge Instructions: In clinic Apply over primary dressing as directed. Secondary Dressing: Zetuvit Plus Silicone Border Dressing 4x4 (in/in) (Generic) 3 x Per Week/15 Days Discharge Instructions: Apply silicone border over primary dressing as directed. #1 I am going to recommend that we have the patient going continue to monitor for any signs of worsening or infection. Obviously if anything changes contact the office and let me know. 2. I am also can recommend that the patient should continue to she can as much as possible monitor for any overall backtracking. Right now I really feel like she is making great progress and I think we are on the right track my hope is this will continue to show signs of improvement as we move forward. We will see patient back for reevaluation in 1 week here in the clinic. If anything worsens or changes patient will contact our office for additional recommendations. Electronic Signature(s) Signed: 06/16/2022 1:46:11  PM By: Worthy Keeler PA-C Entered By: Worthy Keeler on 06/16/2022 13:46:11 [name], [name] ([id number]) [id number] Page 7 of 7 -------------------------------------------------------------------------------- SuperBill Details Patient Name: Date of Service: CELISSE, CIULLA 06/16/2022 Medical Record Number: [id number] Patient Account Number: [id number] Date of Birth/Sex: Treating RN: July 25, 1946 (76 y.o. 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 related to radiation D68.61 Antiphospholipid syndrome I10 Essential (primary) hypertension I25.10 Atherosclerotic heart disease of native coronary artery without angina pectoris Facility Procedures : CPT4 Code: [id number] Description: 99213 - WOUND CARE VISIT-LEV 3 EST PT Modifier: Quantity: 1 Physician Procedures : CPT4 Code Description Modifier [id number] - WC PHYS LEVEL 3 - EST PT ICD-10 Diagnosis Description T81.31XA Disruption of external operation (surgical) wound, not elsewhere classified, initial encounter 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

## 2022-06-17 NOTE — Progress Notes (Signed)
[name], [name] ([id number]) [id number] Page 1 of 6 Visit Report for 06/16/2022 Arrival Information Details Patient Name: Date of Service: [name], [name] 06/16/2022 1:15 PM Medical Record Number: [id number] Patient Account Number: [id number] Date of Birth/Sex: Treating RN: 07-18-1946 (76 y.o. [name] Primary Care [name]: [name] Other Clinician: Referring [name]: Treating [name]/Extender: [name] in Treatment: 20 Visit Information History Since Last Visit Added or deleted any medications: No Patient Arrived: Ambulatory Any new allergies or adverse reactions: No Arrival Time: 13:22 Had a fall or experienced change in No Accompanied By: self activities of daily living that may affect Transfer Assistance: None risk of falls: Patient Identification Verified: Yes Signs or symptoms of abuse/neglect since last visito No Secondary Verification Process Completed: Yes Hospitalized since last visit: No Patient Requires Transmission-Based Precautions: No Implantable device outside of the clinic excluding No Patient Has Alerts: Yes cellular tissue based products placed in the center Patient Alerts: Patient on Blood Thinner since last visit: Has Dressing in Place as Prescribed: Yes Pain Present Now: Yes Notes per patient oral surgery two weeks ago, pain in mouth and chest. Electronic Signature(s) Signed: 06/16/2022 4:43:23 PM By: [name] Pilling RN, BSN Entered By: [name] on 06/16/2022 13:23:19 -------------------------------------------------------------------------------- Clinic Level of Care Assessment Details Patient Name: Date of Service: [name], [name] 06/16/2022 1:15 PM Medical Record Number: [id number] Patient Account Number: [id number] Date of Birth/Sex: Treating RN: 1945/08/05 (76 y.o. [name], [name]: [name] Other Clinician: Referring  [name]: Treating [name]/Extender: [name] in Treatment: 20 Clinic Level of Care Assessment Items TOOL 4 Quantity Score X- 1 0 Use when only an EandM is performed on FOLLOW-UP visit ASSESSMENTS - Nursing Assessment / Reassessment X- 1 10 Reassessment of Co-morbidities (includes updates in patient status) X- 1 5 Reassessment of Adherence to Treatment Plan ASSESSMENTS - Wound and Skin A ssessment / Reassessment X - Simple Wound Assessment / Reassessment - one wound 1 5 _0  - 0 Complex Wound Assessment / Reassessment - multiple wounds _1  - 0 Dermatologic / Skin Assessment (not related to wound area) ASSESSMENTS - Focused Assessment _2  - 0 Circumferential Edema Measurements - multi extremities Cassaro, [name] ([id number]) [id number] Page 2 of 6 _3  - 0 Nutritional Assessment / Counseling / Intervention _4  - 0 Lower Extremity Assessment (monofilament, tuning fork, pulses) _5  - 0 Peripheral Arterial Disease Assessment (using hand held doppler) ASSESSMENTS - Ostomy and/or Continence Assessment and Care _6  - 0 Incontinence Assessment and Management _7  - 0 Ostomy Care Assessment and Management (repouching, etc.) PROCESS - Coordination of Care X - Simple Patient / Family Education for ongoing care 1 15 _8  - 0 Complex (extensive) Patient / Family Education for ongoing care X- 1 10 Staff obtains Programmer, systems, Records, T Results / Process Orders est _9  - 0 Staff telephones HHA, Nursing Homes / Clarify orders / etc _10  - 0 Routine Transfer to another Facility (non-emergent condition) _11  - 0 Routine Hospital Admission (non-emergent condition) _12  - 0 New Admissions / Biomedical engineer / Ordering NPWT Apligraf, etc. , _13  - 0 Emergency Hospital Admission (emergent condition) X- 1 10 Simple Discharge Coordination _14  - 0 Complex (extensive) Discharge Coordination PROCESS - Special Needs _15  - 0 Pediatric / Minor Patient  Management _16  - 0 Isolation Patient Management _17  - 0 Hearing / Language / Visual special needs _18  - 0 Assessment of Community assistance (transportation, D/C planning, etc.) _19  - 0 Additional assistance / Altered mentation _20  -  0 Support Surface(s) Assessment (bed, cushion, seat, etc.) INTERVENTIONS - Wound Cleansing / Measurement X - Simple Wound Cleansing - one wound 1 5 _0  - 0 Complex Wound Cleansing - multiple wounds X- 1 5 Wound Imaging (photographs - any number of wounds) _1  - 0 Wound Tracing (instead of photographs) X- 1 5 Simple Wound Measurement - one wound _2  - 0 Complex Wound Measurement - multiple wounds INTERVENTIONS - Wound Dressings X - Small Wound Dressing one or multiple wounds 1 10 _3  - 0 Medium Wound Dressing one or multiple wounds _4  - 0 Large Wound Dressing one or multiple wounds <HTDSKAJGOTLXBWIO>_0<\/BTDHRCBULAGTXMIW>_8  - 0 Application of Medications - topical <EHOZYYQMGNOIBBCW>_8<\/GQBVQXIHWTUUEKCM>_0  - 0 Application of Medications - injection INTERVENTIONS - Miscellaneous _7  - 0 External ear exam _8  - 0 Specimen Collection (cultures, biopsies, blood, body fluids, etc.) _9  - 0 Specimen(s) / Culture(s) sent or taken to Lab for analysis _10  - 0 Patient Transfer (multiple staff / Civil Service fast streamer / Similar devices) _11  - 0 Simple Staple / Suture removal (25 or less) _12  - 0 Complex Staple / Suture removal (26 or more) [name], [name] ([id number]) [id number] Page 3 of 6 _13  - 0 Hypo / Hyperglycemic Management (close monitor of Blood Glucose) _14  - 0 Ankle / Brachial Index (ABI) - do not check if billed separately X- 1 5 Vital Signs Has the patient been seen at the hospital within the last three years: Yes Total Score: 85 Level Of Care: New/Established - Level 3 Electronic Signature(s) Signed: 06/16/2022 3:58:15 PM By: Rhae Hammock RN Entered By: Rhae Hammock on 06/16/2022 13:36:41 -------------------------------------------------------------------------------- Lower Extremity Assessment  Details Patient Name: Date of Service: [name], [name] 06/16/2022 1:15 PM Medical Record Number: [id number] Patient Account Number: [id number] Date of Birth/Sex: Treating RN: November 18, 1945 (76 y.o. [name] Primary Care Murad Staples: [name] Other Clinician: Referring Rakeem Colley: Treating Kathleena Freeman/Extender: [name] in Treatment: 20 Electronic Signature(s) Signed: 06/16/2022 4:43:23 PM By: [name] Pilling RN, BSN Entered By: [name] on 06/16/2022 13:24:17 -------------------------------------------------------------------------------- Multi-Disciplinary Care Plan Details Patient Name: Date of Service: [name], [name] 06/16/2022 1:15 PM Medical Record Number: [id number] Patient Account Number: [id number] Date of Birth/Sex: Treating RN: 12/26/1945 (76 y.o. [name], [name] Primary Care Hearl Heikes: [name] Other Clinician: Referring Aloysuis Ribaudo: Treating Anja Neuzil/Extender: [name] in Treatment: 20 Active Inactive Wound/Skin Impairment Nursing Diagnoses: Impaired tissue integrity Knowledge deficit related to ulceration/compromised skin integrity Goals: Patient will have a decrease in wound volume by X% from date: (specify in notes) Date Initiated: 01/27/2022 Target Resolution Date: 06/26/2022 Goal Status: Active Patient/caregiver will verbalize understanding of skin care regimen Date Initiated: 01/27/2022 Date Inactivated: 05/05/2022 Target Resolution Date: 04/23/2022 Goal Status: Met Ulcer/skin breakdown will have a volume reduction of 30% by week 4 Date Initiated: 01/27/2022 Target Resolution Date: 06/26/2022 Goal Status: Active Interventions: Assess patient/caregiver ability to obtain necessary supplies Assess patient/caregiver ability to perform ulcer/skin care regimen upon admission and as needed [name], [name] ([id number]) [id number] Page 4 of 6 Assess ulceration(s)  every visit Notes: Electronic Signature(s) Signed: 06/16/2022 3:58:15 PM By: Rhae Hammock RN Entered By: Rhae Hammock on 06/16/2022 13:34:58 -------------------------------------------------------------------------------- Pain Assessment Details Patient Name: Date of Service: [name], [name] 06/16/2022 1:15 PM Medical Record Number: [id number] Patient Account Number: [id number] Date of Birth/Sex: Treating RN: July 31, 1945 (76 y.o. [name] Primary Care Yuma Pacella: [name] Other Clinician: Referring Caprina Wussow: Treating Sulema Braid/Extender: [name] in Treatment: 20 Active Problems Location of Pain Severity and Description  of Pain Patient Has Paino Yes Site Locations Pain Location: Generalized Pain, Pain in Ulcers Rate the pain. Current Pain Level: 3 Pain Management and Medication Current Pain Management: Medication: No Cold Application: No Rest: No Massage: No Activity: No T.E.N.S.: No Heat Application: No Leg drop or elevation: No Is the Current Pain Management Adequate: Adequate How does your wound impact your activities of daily livingo Sleep: No Bathing: No Appetite: No Relationship With Others: No Bladder Continence: No Emotions: No Bowel Continence: No Work: No Toileting: No Drive: No Dressing: No Hobbies: No Electronic Signature(s) Signed: 06/16/2022 4:43:23 PM By: [name] Pilling RN, BSN Entered By: [name] on 06/16/2022 13:24:11 [name], [name] ([id number]) [id number] Page 5 of 6 -------------------------------------------------------------------------------- Patient/Caregiver Education Details Patient Name: Date of Service: [name], [name] 11/22/2023andnbsp1:15 PM Medical Record Number: [id number] Patient Account Number: [id number] Date of Birth/Gender: Treating RN: 05-13-1946 (76 y.o. [name], [name] Primary Care Physician: [name] Other  Clinician: Referring Physician: Treating Physician/Extender: [name] in Treatment: 20 Education Assessment Education Provided To: Patient Education Topics Provided Wound/Skin Impairment: Methods: Explain/Verbal Responses: Reinforcements needed, State content correctly Electronic Signature(s) Signed: 06/16/2022 3:58:15 PM By: Rhae Hammock RN Entered By: Rhae Hammock on 06/16/2022 13:35:12 -------------------------------------------------------------------------------- Wound Assessment Details Patient Name: Date of Service: [name], [name] 06/16/2022 1:15 PM Medical Record Number: [id number] Patient Account Number: [id number] Date of Birth/Sex: Treating RN: 1945/10/06 (76 y.o. [name] Primary Care Jonquil Stubbe: [name] Other Clinician: Referring Aryiana Klinkner: Treating Shawana Knoch/Extender: Renae Fickle Weeks in Treatment: 20 Wound Status Wound Number: 1 Primary Open Surgical Wound Etiology: Wound Location: Right Breast Secondary Calciphylaxis Wounding Event: Surgical Injury Etiology: Date Acquired: 08/26/2021 Wound Open Weeks Of Treatment: 20 Status: Clustered Wound: No Comorbid Asthma, Deep Vein Thrombosis, Hypertension, Peripheral Arterial History: Disease, Hepatitis B, Osteoarthritis, Neuropathy, Received Radiation Photos Wound Measurements [name], [name] ([id number]) Length: (cm) 0 Width: (cm) 0 Depth: (cm) 0 Area: (cm) Volume: (cm) [id number] Page 6 of 6 .6 % Reduction in Area: -32.4% .2 % Reduction in Volume: 7% .7 Epithelialization: Medium (34-66%) 0.094 Tunneling: Yes 0.066 Position (o'clock): 10 Maximum Distance: (cm) 0.9 Undermining: No Wound Description Classification: Full Thickness With Exposed Support Structures Wound Margin: Distinct, outline attached Exudate Amount: Medium Exudate Type: Serosanguineous Exudate Color: red, brown Foul Odor After  Cleansing: No Slough/Fibrino No Wound Bed Granulation Amount: Large (67-100%) Exposed Structure Granulation Quality: Red Fascia Exposed: No Necrotic Amount: None Present (0%) Fat Layer (Subcutaneous Tissue) Exposed: Yes Tendon Exposed: No Muscle Exposed: No Joint Exposed: No Bone Exposed: No Periwound Skin Texture Texture Color No Abnormalities Noted: Yes No Abnormalities Noted: Yes Moisture Temperature / Pain No Abnormalities Noted: Yes Temperature: No Abnormality Electronic Signature(s) Signed: 06/16/2022 4:43:23 PM By: [name] Pilling RN, BSN Entered By: [name] on 06/16/2022 13:30:49 -------------------------------------------------------------------------------- Vitals Details Patient Name: Date of Service: [name] 06/16/2022 1:15 PM Medical Record Number: [id number] Patient Account Number: [id number] Date of Birth/Sex: Treating RN: [birth year] (76 y.o. [name] Primary Care Hutton Pellicane: [name] Other Clinician: Referring Jalilah Wiltsie: Treating Lively Haberman/Extender: [name] in Treatment: 20 Vital Signs Time Taken: 13:23 Temperature (F): 98.7 Height (in): 64 Pulse (bpm): 66 Weight (lbs): 180 Respiratory Rate (breaths/min): 20 Body Mass Index (BMI): 30.9 Blood Pressure (mmHg): 172/97 Reference Range: 80 - 120 mg / dl Electronic Signature(s) Signed: 06/16/2022 4:43:23 PM By: [name] Pilling RN, BSN Entered By: [name] on 06/16/2022 13:23:33

## 2022-06-30 ENCOUNTER — Inpatient Hospital Stay: Payer: Medicare HMO

## 2022-06-30 ENCOUNTER — Other Ambulatory Visit: Payer: Medicare HMO

## 2022-07-14 ENCOUNTER — Encounter (HOSPITAL_BASED_OUTPATIENT_CLINIC_OR_DEPARTMENT_OTHER): Payer: Medicare HMO | Attending: Physician Assistant | Admitting: Physician Assistant

## 2022-07-14 DIAGNOSIS — I2510 Atherosclerotic heart disease of native coronary artery without angina pectoris: Secondary | ICD-10-CM | POA: Diagnosis not present

## 2022-07-14 DIAGNOSIS — L598 Other specified disorders of the skin and subcutaneous tissue related to radiation: Secondary | ICD-10-CM | POA: Diagnosis not present

## 2022-07-14 DIAGNOSIS — X58XXXA Exposure to other specified factors, initial encounter: Secondary | ICD-10-CM | POA: Diagnosis not present

## 2022-07-14 DIAGNOSIS — I10 Essential (primary) hypertension: Secondary | ICD-10-CM | POA: Diagnosis not present

## 2022-07-14 DIAGNOSIS — T8131XA Disruption of external operation (surgical) wound, not elsewhere classified, initial encounter: Secondary | ICD-10-CM | POA: Insufficient documentation

## 2022-07-14 DIAGNOSIS — D6861 Antiphospholipid syndrome: Secondary | ICD-10-CM | POA: Insufficient documentation

## 2022-07-14 DIAGNOSIS — L98492 Non-pressure chronic ulcer of skin of other sites with fat layer exposed: Secondary | ICD-10-CM | POA: Insufficient documentation

## 2022-07-14 NOTE — Progress Notes (Signed)
AYLINE, DINGUS ([id number]) [id number] Page 1 of 7 Visit Report for 07/14/2022 Chief Complaint Document Details Patient Name: Date of Service: [name], [name] 07/14/2022 12:45 PM Medical Record Number: [id number] Patient Account Number: [id number] Date of Birth/Sex: Treating RN: 03/27/1946 (76 y.o. F) Primary Care Provider: [name] Other Clinician: Referring Provider: Treating Provider/Extender: [name] in Treatment: 24 Information Obtained from: Patient Chief Complaint Right breast ulcer Electronic Signature(s) Signed: 07/14/2022 12:49:11 PM By: Worthy Keeler PA-C Entered By: Worthy Keeler on 07/14/2022 12:49:11 -------------------------------------------------------------------------------- HPI Details Patient Name: Date of Service: [name], [name] 07/14/2022 12:45 PM Medical Record Number: [id number] Patient Account Number: [id number] Date of Birth/Sex: Treating RN: 10-26-1945 (76 y.o. F) Primary Care Provider: [name] Other Clinician: Referring Provider: Treating Provider/Extender: [name] in Treatment: 24 History of Present Illness HPI Description: 01-27-2022 upon evaluation today patient presents for initial evaluation here in our clinic concerning issues she has been having with her right breast. She had an area of calcium buildup where she had actual staples that were being pushed out that were noted from her previous breast surgery which was roughly 23 years ago when she had cancer. At that point she had also undergone radiation therapy. 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 Nonetheless after her most recent surgery in February to remove the staples and the calcium deposit this has started to build back up due to the damage to the breast tissue. She was seen initially by Dr. Brantley Stage. Eventually he referred her to Dr.  Rebekah Chesterfield to see about a mastectomy to take care of the issue. With that being said this was something that was good to be reserved just for worst-case scenario in fact they were a little bit concerned about her reaction considering her blood thinners and the fact that she may not do as well and surgery is what they would like to see with all the other medical problems that she has. For that reason they referred her to [name] in order to see what we can do from a healing standpoint. 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 02-03-2022 upon evaluation today patient appears to be doing okay in regard to her wound on the breast. Fortunately I do not see any signs of worsening. Unfortunately I do believe that she is still having some discomfort also there was some misunderstanding about how to do the dressing which I think will make it a little bit easier she should be putting it in before waiting it and then after its in and situated she can wet this. I think that is going to be a lot easier for her than what she has been trying to do. 02-10-2022 upon evaluation today patient appears to be doing about the same in regard to her wound. She has been tolerating the dressing changes without complication. We are actually starting her on linezolid today actually had to apply to get this approved by her insurance I am hopeful that this will be affordable for her now it was going to cost her way too much before. With that being said I do believe that she is  headed in the right direction which is great news. No fevers, chills, nausea, vomiting, or diarrhea. 7/26; right breast apparently secondary to surgery earlier  this year for protruding staples and underlying traumatic calcifications. She has a nonhealing wound with some depth and tunneling. She has been using Hydrofera Blue with some improvement in measurements today. Nevertheless she has a wound VAC and we went ahead and applied this. I am not certain whether she is going to be eligible for home health but her staff are certainly going to work on it 02-24-2022 upon evaluation today patient appears to be doing excellent she is using the gauze VAC at this point and it seems to be doing a great job for her. Fortunately there does not appear to be any signs of active infection at this time which is excellent. No fever or chills noted 03-10-2022 upon evaluation today patient appears to be doing well with regard to her wound although the wound was definitely over packed compared to what it HOLLYNN, GARNO P ([id number]) [id number] Page 2 of 7 needs to be. I think they were probably got to switch to [facility] Blue rope which I think would be much easier to put him than the current black foam which is what home health is actually using we will try to do a gauze VAC but to be honest there is not actually doing that. Fortunately there does not appear to be any signs of active infection locally or systemically at this time which is great news. I think the biggest issue I see is simply that we need to make sure and have the patient have the Hydrofera Blue rope which is again I think a much better way to go with this and avoid altogether the use of the black foam inside the wound and put a piece on the outside that is appropriate and fine. 03-17-2022 upon evaluation today patient appears to be doing well currently in regard to her wound. The wound VAC does seem to be helping the [facility] seems to be much better and very pleased in this regard. I do not see any signs of active infection locally or systemically at this time which is great news.  No fevers, chills, nausea, vomiting, or diarrhea. 03-24-2022 upon evaluation today patient appears to be doing well with regard to her wound. I am seeing signs of improvement which is great news. Fortunately she is not doing any worse unfortunately they did not have the supplies and her wound VAC on until yesterday but otherwise she is doing excellent. 03-31-2022 upon evaluation today patient appears to be doing well currently in regard to her wound. I do feel like the wound VAC is doing well we may be getting close to the point of not utilizing the wound VAC any longer the patient states that she would be happy to get to that point. Fortunately I do not see any evidence of active infection locally or systemically which is great news. 04-07-2022 upon evaluation today patient appears to be doing well currently in regard to the wound on her right breast area. Fortunately there does not appear to be any signs of infection at this time which is great news and overall I am extremely pleased with where we stand today. No fevers, chills, nausea, vomiting, or diarrhea. 04-21-2022 upon evaluation today patient appears to be doing well currently in regard to her wound. She has been tolerating the dressing changes with the Hydrofera Blue without complication and overall I am extremely  pleased with where things stand today. There does not appear to be any evidence of active infection locally or systemically at this time which is great news. No fevers, chills, nausea, vomiting, or diarrhea. I do believe that she is headed in the right direction here. 05-05-2022 upon evaluation today patient actually appears to be doing excellent in regard to her wound. This is showing signs of great improvement and very pleased with where we stand. I do not see any signs of infection locally or systemically at this time which is great news. No fevers, chills, nausea, vomiting, or diarrhea. 10/18; small open area in the right breast  but with 1.7 cm of depth. Surgical wound in the setting of remote radiation greater than 20 years ago. Switch to endoform last week 05-26-2022 upon evaluation today patient appears to be doing well currently in regard to the overall appearance. Fortunately I do not see any signs of active infection locally or systemically at this time which is great news. No fevers, chills, nausea, vomiting, or diarrhea. 06-16-2022 upon evaluation today patient appears to be doing well currently in regard to her wound. She has been making excellent progress. Fortunately there does not appear to be any signs of infection locally nor systemically at this time which is great news. No fevers, chills, nausea, vomiting, or diarrhea. 07-14-2022 upon evaluation today patient appears to be doing well currently in regard to her wound. She has been tolerating the dressing changes without complication. Fortunately I think she is making good progress this is about the seal on the bottom of the wound but has not completely. Electronic Signature(s) Signed: 07/14/2022 1:04:32 PM By: Worthy Keeler PA-C Entered By: Worthy Keeler on 07/14/2022 13:04:32 -------------------------------------------------------------------------------- Physical Exam Details Patient Name: Date of Service: [name], [name] 07/14/2022 12:45 PM Medical Record Number: [id number] Patient Account Number: [id number] Date of Birth/Sex: Treating RN: 23-Mar-1946 (76 y.o. F) Primary Care Provider: [name] Other Clinician: Referring Provider: Treating Provider/Extender: Renae Fickle Weeks in Treatment: 4 Constitutional Well-nourished and well-hydrated in no acute distress. Respiratory normal breathing without difficulty. Psychiatric this patient is able to make decisions and demonstrates good insight into disease process. Alert and Oriented x 3. pleasant and cooperative. Notes Inspection patient's wound bed actually showed  signs of good granulation and epithelization at this point. Fortunately I do not see any signs of active infection which is great news and I think that the endoform is doing a good job here. We have been having some trouble getting her supplies I am not sure exactly what all was going on of signing this a couple of times but it still shows that needs to be signed. Again I discussed that with him today I did apologize for the delay. Electronic Signature(s) Signed: 07/14/2022 1:04:58 PM By: Worthy Keeler PA-C Entered By: Worthy Keeler on 07/14/2022 13:04:58 Cropper, Lelon Frohlich ([id number]) [id number] Page 3 of 7 -------------------------------------------------------------------------------- Physician Orders Details Patient Name: Date of Service: [name], [name] 07/14/2022 12:45 PM Medical Record Number: [id number] Patient Account Number: [id number] Date of Birth/Sex: Treating RN: 01/02/1946 (76 y.o. [name], [name] Primary Care Provider: [name] Other Clinician: Referring Provider: Treating Provider/Extender: [name] in Treatment: 24 Verbal / Phone Orders: No Diagnosis Coding ICD-10 Coding Code Description T81.31XA Disruption of external operation (surgical) wound, not elsewhere classified, initial encounter L98.492 Non-pressure chronic ulcer of skin of other sites with fat layer exposed E83.59 Other disorders of calcium metabolism L59.8 Other  specified disorders of the skin and subcutaneous tissue related to radiation D68.61 Antiphospholipid syndrome I10 Essential (primary) hypertension I25.10 Atherosclerotic heart disease of native coronary artery without angina pectoris Follow-up Appointments ppointment in 2 weeks. - w/ Jeri Cos on Wednesday's Return A Bathing/ Shower/ Hygiene May shower with protection but do not get wound dressing(s) wet. - May take dressing off and wash with antibacterial soap and  water Negative Presssure Wound Therapy Discontinue wound [facility] home health for wound care. Wound Treatment Wound #1 - Breast Wound Laterality: Right Cleanser: Normal Saline (DME) (Generic) 1 x Per Day/30 Days Discharge Instructions: Cleanse the wound with Normal Saline prior to applying a clean dressing using gauze sponges, not tissue or cotton balls. Cleanser: Wound Cleanser (Generic) 1 x Per Day/30 Days Discharge Instructions: Cleanse the wound with wound cleanser prior to applying a clean dressing using gauze sponges, not tissue or cotton balls. Peri-Wound Care: Skin Prep (DME) (Generic) 1 x Per Day/30 Days Discharge Instructions: Use skin prep as directed Prim Dressing: Endoform 2x2 in (DME) (Generic) 1 x Per Day/30 Days ary Discharge Instructions: Moisten with saline Secondary Dressing: Woven Gauze Sponge, Non-Sterile 4x4 in (DME) (Generic) 1 x Per Day/30 Days Discharge Instructions: In clinic Apply over primary dressing as directed. Secondary Dressing: Zetuvit Plus Silicone Border Dressing 4x4 (in/in) (DME) (Generic) 1 x Per Day/30 Days Discharge Instructions: Apply silicone border over primary dressing as directed. Electronic Signature(s) Unsigned Entered By: Rhae Hammock on 07/14/2022 91:47:82 Signature(s): SUAD, AUTREY P ([id number]) [phone] Date(s): [id number] Page 4 of 7 -------------------------------------------------------------------------------- Problem List Details Patient Name: Date of Service: [name], [name] 07/14/2022 12:45 PM Medical Record Number: [id number] Patient Account Number: [id number] Date of Birth/Sex: Treating RN: [birth year] (76 y.o. F) Primary Care Provider: [name] Other Clinician: Referring Provider: Treating Provider/Extender: [name] in Treatment: 24 Active Problems ICD-10 Encounter Code Description Active Date MDM Diagnosis T81.31XA Disruption of  external operation (surgical) wound, not elsewhere classified, 01/27/2022 No Yes initial encounter L98.492 Non-pressure chronic ulcer of skin of other sites with fat layer exposed 01/27/2022 No Yes E83.59 Other disorders of calcium metabolism 01/27/2022 No Yes L59.8 Other specified disorders of the skin and subcutaneous tissue related to 01/27/2022 No Yes radiation D68.61 Antiphospholipid syndrome 01/27/2022 No Yes I10 Essential (primary) hypertension 01/27/2022 No Yes I25.10 Atherosclerotic heart disease of native coronary artery without angina pectoris 01/27/2022 No Yes Inactive Problems Resolved Problems Electronic Signature(s) Signed: 07/14/2022 12:49:06 PM By: Worthy Keeler PA-C Entered By: Worthy Keeler on 07/14/2022 12:49:05 -------------------------------------------------------------------------------- Progress Note Details Patient Name: Date of Service: [name], [name] 07/14/2022 12:45 PM Medical Record Number: [id number] Patient Account Number: [id number] Date of Birth/Sex: Treating RN: 28-Jul-1945 (76 y.o. F) Primary Care Provider: [name] Other Clinician: Referring Provider: Treating Provider/Extender: Regenia Skeeter, Chauncey Fischer in Treatment: [address] [place] ([id number]) [id number] Page 5 of 7 Subjective Chief Complaint Information obtained from Patient Right breast ulcer History of Present Illness (HPI) 01-27-2022 upon evaluation today patient presents for initial evaluation here in our clinic concerning issues she has been having with her right breast. She had an area of calcium buildup where she had actual staples that were being pushed out that were noted from her previous breast surgery which was roughly 23 years ago when she had cancer. At that point she had also undergone radiation therapy. 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 Nonetheless after her most recent surgery in February  to remove the staples and the calcium deposit this has started to build back up due to the damage to the breast tissue. She was seen initially by Dr. Brantley Stage. Eventually he referred her to Dr. Rebekah Chesterfield to see about a mastectomy to take care of the issue. With that being said this was something that was good to be reserved just for worst-case scenario in fact they were a little bit concerned about her reaction considering her blood thinners and the fact that she may not do as well and surgery is what they would like to see with all the other medical problems that she has. For that reason they referred her to [name] in order to see what we can do from a healing standpoint. 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 02-03-2022 upon evaluation today patient appears to be doing okay in regard to her wound on the breast. Fortunately I do not see any signs of worsening. Unfortunately I do believe that she is still having some discomfort also there was some misunderstanding about how to do the dressing which I think will make it a little bit easier she should be putting it in before waiting it and then after its in and situated she can wet this. I think that is going to be a lot easier for her than what she has been trying to do. 02-10-2022 upon evaluation today patient appears to be doing about the same in regard to her wound. She has been tolerating the dressing changes without complication. We are actually starting her on linezolid today actually had to apply to get this approved by her insurance I am hopeful that this will be affordable for her now it was going to cost her way too much before. With that  being said I do believe that she is headed in the right direction which is great news. No fevers, chills, nausea, vomiting, or diarrhea. 7/26; right breast apparently secondary to surgery earlier this year for protruding staples and underlying traumatic calcifications. She has a nonhealing wound with some depth and tunneling. She has been using Hydrofera Blue with some improvement in measurements today. Nevertheless she has a wound VAC and we went ahead and applied this. I am not certain whether she is going to be eligible for home health but her staff are certainly going to work on it 02-24-2022 upon evaluation today patient appears to be doing excellent she is using the gauze VAC at this point and it seems to be doing a great job for her. Fortunately there does not appear to be any signs of active infection at this time which is excellent. No fever or chills noted 03-10-2022 upon evaluation today patient appears to be doing well with regard to her wound although the wound was definitely over packed compared to what it needs to be. I think they were probably got to switch to [facility] Blue rope which I think would be much easier to put him than the current black foam which is what home health is actually using we will try to do a gauze VAC but to be honest there is not actually doing that. Fortunately there does not appear to be any signs of active infection  locally or systemically at this time which is great news. I think the biggest issue I see is simply that we need to make sure and have the patient have the Hydrofera Blue rope which is again I think a much better way to go with this and avoid altogether the use of the black foam inside the wound and put a piece on the outside that is appropriate and fine. 03-17-2022 upon evaluation today patient appears to be doing well currently in regard to her wound. The wound VAC does seem to be helping the [facility] seems to be much better and very pleased  in this regard. I do not see any signs of active infection locally or systemically at this time which is great news. No fevers, chills, nausea, vomiting, or diarrhea. 03-24-2022 upon evaluation today patient appears to be doing well with regard to her wound. I am seeing signs of improvement which is great news. Fortunately she is not doing any worse unfortunately they did not have the supplies and her wound VAC on until yesterday but otherwise she is doing excellent. 03-31-2022 upon evaluation today patient appears to be doing well currently in regard to her wound. I do feel like the wound VAC is doing well we may be getting close to the point of not utilizing the wound VAC any longer the patient states that she would be happy to get to that point. Fortunately I do not see any evidence of active infection locally or systemically which is great news. 04-07-2022 upon evaluation today patient appears to be doing well currently in regard to the wound on her right breast area. Fortunately there does not appear to be any signs of infection at this time which is great news and overall I am extremely pleased with where we stand today. No fevers, chills, nausea, vomiting, or diarrhea. 04-21-2022 upon evaluation today patient appears to be doing well currently in regard to her wound. She has been tolerating the dressing changes with the [facility] without complication and overall I am extremely pleased with where things stand today. There does not appear to be any evidence of active infection locally or systemically at this time which is great news. No fevers, chills, nausea, vomiting, or diarrhea. I do believe that she is headed in the right direction here. 05-05-2022 upon evaluation today patient actually appears to be doing excellent in regard to her wound. This is showing signs of great improvement and very pleased with where we stand. I do not see any signs of infection locally or systemically at this time  which is great news. No fevers, chills, nausea, vomiting, or diarrhea. 10/18; small open area in the right breast but with 1.7 cm of depth. Surgical wound in the setting of remote radiation greater than 20 years ago. Switch to endoform last week 05-26-2022 upon evaluation today patient appears to be doing well currently in regard to the overall appearance. Fortunately I do not see any signs of active infection locally or systemically at this time which is great news. No fevers, chills, nausea, vomiting, or diarrhea. 06-16-2022 upon evaluation today patient appears to be doing well currently in regard to her wound. She has been making excellent progress. Fortunately there does not appear to be any signs of infection locally nor systemically at this time which is great news. No fevers, chills, nausea, vomiting, or diarrhea. 07-14-2022 upon evaluation today patient appears to be doing well currently in regard to her wound. She has been tolerating the  dressing changes without complication. Fortunately I think she is making good progress this is about the seal on the bottom of the wound but has not completely. [name], [name] ([id number]) [id number] Page 6 of 7 Objective Constitutional Well-nourished and well-hydrated in no acute distress. Vitals Time Taken: 12:38 PM, Height: 64 in, Weight: 180 lbs, BMI: 30.9, Temperature: 98.0 F, Pulse: 87 bpm, Respiratory Rate: 18 breaths/min, Blood Pressure: 136/85 mmHg. Respiratory normal breathing without difficulty. Psychiatric this patient is able to make decisions and demonstrates good insight into disease process. Alert and Oriented x 3. pleasant and cooperative. General Notes: Inspection patient's wound bed actually showed signs of good granulation and epithelization at this point. Fortunately I do not see any signs of active infection which is great news and I think that the endoform is doing a good job here. We have been  having some trouble getting her supplies I am not sure exactly what all was going on of signing this a couple of times but it still shows that needs to be signed. Again I discussed that with him today I did apologize for the delay. Integumentary (Hair, Skin) Wound #1 status is Open. Original cause of wound was Surgical Injury. The date acquired was: 08/26/2021. The wound has been in treatment 24 weeks. The wound is located on the Right Breast. The wound measures 0.5cm length x 0.2cm width x 0.7cm depth; 0.079cm^2 area and 0.055cm^3 volume. There is Fat Layer (Subcutaneous Tissue) exposed. There is no undermining noted, however, there is tunneling at 10:00 with a maximum distance of 0.9cm. There is a medium amount of serosanguineous drainage noted. The wound margin is distinct with the outline attached to the wound base. There is large (67-100%) red granulation within the wound bed. There is no necrotic tissue within the wound bed. The periwound skin appearance had no abnormalities noted for texture. The periwound skin appearance had no abnormalities noted for moisture. The periwound skin appearance had no abnormalities noted for color. Periwound temperature was noted as No Abnormality. 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 - w/ Jeri Cos, Wednesday's Bathing/ Shower/ Hygiene: May shower with protection but do not get wound dressing(s) wet. - May take dressing off and wash with antibacterial soap and water Negative  Presssure Wound Therapy: Discontinue wound vac Home Health: [facility] for wound care. WOUND #1: - Breast Wound Laterality: Right Cleanser: Normal Saline (DME) (Generic) 1 x Per Day/30 Days Discharge Instructions: Cleanse the wound with Normal Saline prior to applying a clean dressing using gauze sponges, not tissue or cotton balls. Cleanser: Wound Cleanser (Generic) 1 x Per Day/30 Days Discharge Instructions: Cleanse the wound with wound cleanser prior to applying a clean dressing using gauze sponges, not tissue or cotton balls. Peri-Wound Care: Skin Prep (DME) (Generic) 1 x Per Day/30 Days Discharge Instructions: Use skin prep as directed Prim Dressing: Endoform 2x2 in (DME) (Generic) 1 x Per Day/30 Days ary Discharge Instructions: Moisten with saline Secondary Dressing: Woven Gauze Sponge, Non-Sterile 4x4 in (DME) (Generic) 1 x Per Day/30 Days Discharge Instructions: In clinic Apply over primary dressing as directed. Secondary Dressing: Zetuvit Plus Silicone Border Dressing 4x4 (in/in) (  DME) (Generic) 1 x Per Day/30 Days Discharge Instructions: Apply silicone border over primary dressing as directed. 1. I am going to recommend that we have the patient continue to monitor for any signs of infection or worsening. Obviously based on what I am seeing I do believe that she should continue with the endoform I think this is still doing a good job. Return also can recommend that we have the patient at this point continue with the bordered foam dressing to cover which I think is still a good option here. We will see patient back for reevaluation in 1 week here in the clinic. If anything worsens or changes patient will contact our office for additional recommendations. Electronic Signature(s) [name], [name] ([id number]) [id number] Page 7 of 7 Signed: 07/14/2022 1:05:24 PM By: Worthy Keeler PA-C Entered By: Worthy Keeler on 07/14/2022  13:05:24 -------------------------------------------------------------------------------- SuperBill Details Patient Name: Date of Service: [name], [name] 07/14/2022 Medical Record Number: [id number] Patient Account Number: [id number] Date of Birth/Sex: Treating RN: 10/19/1945 (76 y.o. 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 D68.61 Antiphospholipid syndrome I10 Essential (primary) hypertension I25.10 Atherosclerotic heart disease of native coronary artery without angina pectoris Facility Procedures : CPT4 Code: 00712197 Description: 99213 - WOUND CARE VISIT-LEV 3 EST PT Modifier: Quantity: 1 Physician Procedures : CPT4 Code Description Modifier [id number] - WC PHYS LEVEL 3 - EST PT ICD-10 Diagnosis Description T81.31XA Disruption of external operation (surgical) wound, not elsewhere classified, initial encounter L98.492 Non-pressure chronic ulcer of skin of  other sites with fat layer exposed E83.59 Other disorders of calcium metabolism L59.8 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

## 2022-07-15 ENCOUNTER — Telehealth: Payer: Self-pay | Admitting: Cardiology

## 2022-07-15 NOTE — Telephone Encounter (Signed)
*  STAT* If patient is at the pharmacy, call can be transferred to refill team.   1. Which medications need to be refilled? (please list name of each medication and dose if known)   Evolocumab with Infusor ([place]) 420 MG/3.5ML SOCT    2. Which pharmacy/location (including street and city if local pharmacy) is medication to be sent to? [id number] - [place], [place] - [name]   3. Do they need a 30 day or 90 day supply?  30 day supply

## 2022-07-16 NOTE — Telephone Encounter (Signed)
[place] [facility]  ID [id number]  BIN [id number] PCN MEDDAET GRP  [id number]

## 2022-07-16 NOTE — Telephone Encounter (Signed)
Pt has still not received this medication, pt states she is already late on taking this medication

## 2022-07-28 ENCOUNTER — Encounter (HOSPITAL_BASED_OUTPATIENT_CLINIC_OR_DEPARTMENT_OTHER): Payer: Medicare HMO | Admitting: Physician Assistant

## 2022-07-28 ENCOUNTER — Other Ambulatory Visit: Payer: Self-pay | Admitting: Family Medicine

## 2022-07-28 ENCOUNTER — Other Ambulatory Visit: Payer: Self-pay | Admitting: *Deleted

## 2022-07-28 DIAGNOSIS — I6529 Occlusion and stenosis of unspecified carotid artery: Secondary | ICD-10-CM

## 2022-07-28 DIAGNOSIS — E2839 Other primary ovarian failure: Secondary | ICD-10-CM

## 2022-07-29 ENCOUNTER — Telehealth: Payer: Self-pay | Admitting: Pharmacist Clinician (PhC)/ Clinical Pharmacy Specialist

## 2022-07-29 MED ORDER — REPATHA PUSHTRONEX SYSTEM 420 MG/3.5ML ~~LOC~~ SOCT: 420.0000 mg | SUBCUTANEOUS | 3 refills | Status: DC

## 2022-07-29 NOTE — Telephone Encounter (Signed)
PA approved to 07/26/23  Rx sent to pharmacy.  Patient notified

## 2022-07-29 NOTE — Progress Notes (Signed)
[name], [name] ([id number]) [id number] Page 1 of 7 Visit Report for 03/31/2022 Arrival Information Details Patient Name: Date of Service: [name], [name] 03/31/2022 1:45 PM Medical Record Number: [id number] Patient Account Number: [id number] Date of Birth/Sex: Treating RN: 1945-09-25 (77 y.o. [name], [name] Primary Care Jerell Demery: [name] Other Clinician: Referring Taleshia Luff: Treating Oluwademilade Mckiver/Extender: [name] in Treatment: 9 Visit Information History Since Last Visit Added or deleted any medications: No Patient Arrived: Ambulatory Any new allergies or adverse reactions: No Arrival Time: 14:02 Had a fall or experienced change in No Accompanied By: self activities of daily living that may affect Transfer Assistance: None risk of falls: Patient Identification Verified: Yes Signs or symptoms of abuse/neglect since last visito No Secondary Verification Process Completed: Yes Hospitalized since last visit: No Patient Requires Transmission-Based Precautions: No Implantable device outside of the clinic excluding No Patient Has Alerts: Yes cellular tissue based products placed in the center Patient Alerts: Patient on Blood Thinner since last visit: Has Dressing in Place as Prescribed: Yes Pain Present Now: Yes Electronic Signature(s) Signed: 07/28/2022 5:37:22 PM By: Rhae Hammock RN Entered By: Rhae Hammock on 03/31/2022 14:03:52 -------------------------------------------------------------------------------- Clinic Level of Care Assessment Details Patient Name: Date of Service: [name], [name] 03/31/2022 1:45 PM Medical Record Number: [id number] Patient Account Number: [id number] Date of Birth/Sex: Treating RN: Sep 27, 1945 (77 y.o. [name], [name] Primary Care Kolleen Ochsner: [name] Other Clinician: Referring Creta Dorame: Treating Rowin Bayron/Extender: [name] in  Treatment: 9 Clinic Level of Care Assessment Items TOOL 4 Quantity Score X- 1 0 Use when only an EandM is performed on FOLLOW-UP visit ASSESSMENTS - Nursing Assessment / Reassessment X- 1 10 Reassessment of Co-morbidities (includes updates in patient status) X- 1 5 Reassessment of Adherence to Treatment Plan ASSESSMENTS - Wound and Skin A ssessment / Reassessment X - Simple Wound Assessment / Reassessment - one wound 1 5 '[]'$  - 0 Complex Wound Assessment / Reassessment - multiple wounds '[]'$  - 0 Dermatologic / Skin Assessment (not related to wound area) ASSESSMENTS - Focused Assessment '[]'$  - 0 Circumferential Edema Measurements - multi extremities '[]'$  - 0 Nutritional Assessment / Counseling / Intervention [name], [name] ([id number]) [id number] Page 2 of 7 '[]'$  - 0 Lower Extremity Assessment (monofilament, tuning fork, pulses) '[]'$  - 0 Peripheral Arterial Disease Assessment (using hand held doppler) ASSESSMENTS - Ostomy and/or Continence Assessment and Care '[]'$  - 0 Incontinence Assessment and Management '[]'$  - 0 Ostomy Care Assessment and Management (repouching, etc.) PROCESS - Coordination of Care X - Simple Patient / Family Education for ongoing care 1 15 '[]'$  - 0 Complex (extensive) Patient / Family Education for ongoing care X- 1 10 Staff obtains Programmer, systems, Records, T Results / Process Orders est X- 1 10 Staff telephones HHA, Nursing Homes / Clarify orders / etc '[]'$  - 0 Routine Transfer to another Facility (non-emergent condition) '[]'$  - 0 Routine Hospital Admission (non-emergent condition) '[]'$  - 0 New Admissions / Biomedical engineer / Ordering NPWT Apligraf, etc. , '[]'$  - 0 Emergency Hospital Admission (emergent condition) X- 1 10 Simple Discharge Coordination '[]'$  - 0 Complex (extensive) Discharge Coordination PROCESS - Special Needs '[]'$  - 0 Pediatric / Minor Patient Management '[]'$  - 0 Isolation Patient Management '[]'$  - 0 Hearing / Language /  Visual special needs '[]'$  - 0 Assessment of Community assistance (transportation, D/C planning, etc.) '[]'$  - 0 Additional assistance / Altered mentation '[]'$  - 0 Support Surface(s) Assessment (bed, cushion, seat, etc.) INTERVENTIONS - Wound Cleansing / Measurement  X - Simple Wound Cleansing - one wound 1 5 '[]'$  - 0 Complex Wound Cleansing - multiple wounds X- 1 5 Wound Imaging (photographs - any number of wounds) '[]'$  - 0 Wound Tracing (instead of photographs) X- 1 5 Simple Wound Measurement - one wound '[]'$  - 0 Complex Wound Measurement - multiple wounds INTERVENTIONS - Wound Dressings X - Small Wound Dressing one or multiple wounds 1 10 '[]'$  - 0 Medium Wound Dressing one or multiple wounds '[]'$  - 0 Large Wound Dressing one or multiple wounds X- 1 5 Application of Medications - topical '[]'$  - 0 Application of Medications - injection INTERVENTIONS - Miscellaneous '[]'$  - 0 External ear exam '[]'$  - 0 Specimen Collection (cultures, biopsies, blood, body fluids, etc.) '[]'$  - 0 Specimen(s) / Culture(s) sent or taken to Lab for analysis '[]'$  - 0 Patient Transfer (multiple staff / Civil Service fast streamer / Similar devices) '[]'$  - 0 Simple Staple / Suture removal (25 or less) '[]'$  - 0 Complex Staple / Suture removal (26 or more) '[]'$  - 0 Hypo / Hyperglycemic Management (close monitor of Blood Glucose) [name], [name] ([id number]) [id number] Page 3 of 7 '[]'$  - 0 Ankle / Brachial Index (ABI) - do not check if billed separately X- 1 5 Vital Signs Has the patient been seen at the hospital within the last three years: Yes Total Score: 100 Level Of Care: New/Established - Level 3 Electronic Signature(s) Signed: 07/28/2022 5:37:22 PM By: Rhae Hammock RN Entered By: Rhae Hammock on 03/31/2022 14:50:30 -------------------------------------------------------------------------------- Encounter Discharge Information Details Patient Name: Date of Service: [name] 03/31/2022 1:45  PM Medical Record Number: [id number] Patient Account Number: [id number] Date of Birth/Sex: Treating RN: [birth year] (77 y.o. [name], [name] Primary Care Larue Lightner: [name] Other Clinician: Referring Hyden Soley: Treating Emile Kyllo/Extender: [name] in Treatment: 9 Encounter Discharge Information Items Discharge Condition: Stable Ambulatory Status: Ambulatory Discharge Destination: Home Transportation: Private Auto Accompanied By: self Schedule Follow-up Appointment: Yes Clinical Summary of Care: Patient Declined Electronic Signature(s) Signed: 07/28/2022 5:37:22 PM By: Rhae Hammock RN Entered By: Rhae Hammock on 03/31/2022 14:51:25 -------------------------------------------------------------------------------- Lower Extremity Assessment Details Patient Name: Date of Service: [name] 03/31/2022 1:45 PM Medical Record Number: [id number] Patient Account Number: [id number] Date of Birth/Sex: Treating RN: Aug 22, 1945 (77 y.o. [name], [name] Primary Care Kellin Fifer: [name] Other Clinician: Referring Jayro Mcmath: Treating Marvelle Span/Extender: Renae Fickle Weeks in Treatment: 9 Electronic Signature(s) Signed: 07/28/2022 5:37:22 PM By: Rhae Hammock RN Entered By: Rhae Hammock on 03/31/2022 14:05:09 -------------------------------------------------------------------------------- Multi-Disciplinary Care Plan Details Patient Name: Date of Service: [name] 03/31/2022 1:45 PM Medical Record Number: [id number] Patient Account Number: [id number] EMMI, WERTHEIM ([id number]) [id number] Page 4 of 7 Date of Birth/Sex: Treating RN: 1946-07-02 (77 y.o. [name], [name] Primary Care Sadira Standard: Other Clinician: [name] Referring Reneta Niehaus: Treating Darral Rishel/Extender: [name] in Treatment: 9 Active Inactive Wound/Skin  Impairment Nursing Diagnoses: Impaired tissue integrity Knowledge deficit related to ulceration/compromised skin integrity Goals: Patient will have a decrease in wound volume by X% from date: (specify in notes) Date Initiated: 01/27/2022 Target Resolution Date: 04/02/2022 Goal Status: Active Patient/caregiver will verbalize understanding of skin care regimen Date Initiated: 01/27/2022 Target Resolution Date: 04/02/2022 Goal Status: Active Ulcer/skin breakdown will have a volume reduction of 30% by week 4 Date Initiated: 01/27/2022 Target Resolution Date: 04/02/2022 Goal Status: Active Interventions: Assess patient/caregiver ability to obtain necessary supplies Assess patient/caregiver ability to perform ulcer/skin care regimen upon admission and as needed Assess  ulceration(s) every visit Notes: Electronic Signature(s) Signed: 07/28/2022 5:37:22 PM By: Rhae Hammock RN Entered By: Rhae Hammock on 03/31/2022 14:23:38 -------------------------------------------------------------------------------- Pain Assessment Details Patient Name: Date of Service: [name] 03/31/2022 1:45 PM Medical Record Number: [id number] Patient Account Number: [id number] Date of Birth/Sex: Treating RN: 1945/09/18 (77 y.o. [name], [name] Primary Care Lashanta Elbe: [name] Other Clinician: Referring Bensyn Bornemann: Treating Ennis Delpozo/Extender: Renae Fickle Weeks in Treatment: 9 Active Problems Location of Pain Severity and Description of Pain Patient Has Paino Yes Site Locations Pain Location: [name], [name] ([id number]) [id number] Page 5 of 7 Pain Location: Pain in Ulcers Duration of the Pain. Constant / Intermittento Intermittent Rate the pain. Current Pain Level: 5 Worst Pain Level: 10 Least Pain Level: 0 Tolerable Pain Level: 5 Character of Pain Describe the Pain: Aching Pain Management and Medication Current Pain  Management: Medication: No Cold Application: No Rest: No Massage: No Activity: No T.[name].N.S.: No Heat Application: No Leg drop or elevation: No Is the Current Pain Management Adequate: Adequate How does your wound impact your activities of daily livingo Sleep: No Bathing: No Appetite: No Relationship With Others: No Bladder Continence: No Emotions: No Bowel Continence: No Work: No Toileting: No Drive: No Dressing: No Hobbies: No Electronic Signature(s) Signed: 07/28/2022 5:37:22 PM By: Rhae Hammock RN Entered By: Rhae Hammock on 03/31/2022 14:04:54 -------------------------------------------------------------------------------- Patient/Caregiver Education Details Patient Name: Date of Service: [name] 9/6/2023andnbsp1:45 PM Medical Record Number: [id number] Patient Account Number: [id number] Date of Birth/Gender: Treating RN: 08/07/1945 (77 y.o. [name] Primary Care Physician: [name] Other Clinician: Referring Physician: Treating Physician/Extender: [name] in Treatment: 9 Education Assessment Education Provided To: Patient Education Topics Provided Wound/Skin Impairment: Methods: Explain/Verbal Responses: Reinforcements needed, State content correctly Electronic Signature(s) Signed: 07/28/2022 5:37:22 PM By: Rhae Hammock RN [name], [name] ([id number]) [id number] Page 6 of 7 Entered By: Rhae Hammock on 03/31/2022 14:23:50 -------------------------------------------------------------------------------- Wound Assessment Details Patient Name: Date of Service: [name], [name] 03/31/2022 1:45 PM Medical Record Number: [id number] Patient Account Number: [id number] Date of Birth/Sex: Treating RN: 1945/12/17 (77 y.o. [name], [name] Primary Care Airlie Blumenberg: [name] Other Clinician: Referring Bary Limbach: Treating Santiaga Butzin/Extender: Renae Fickle Weeks in Treatment: 9 Wound Status Wound Number: 1 Primary Open Surgical Wound Etiology: Wound Location: Right Breast Secondary Calciphylaxis Wounding Event: Surgical Injury Etiology: Date Acquired: 08/26/2021 Wound Open Weeks Of Treatment: 9 Status: Clustered Wound: No Comorbid Asthma, Deep Vein Thrombosis, Hypertension, Peripheral Arterial History: Disease, Hepatitis B, Osteoarthritis, Neuropathy, Received Radiation Photos Wound Measurements Length: (cm) 0.3 Width: (cm) 0.5 Depth: (cm) 1 Area: (cm) 0.118 Volume: (cm) 0.118 % Reduction in Area: -66.2% % Reduction in Volume: -66.2% Epithelialization: Small (1-33%) Tunneling: Yes Position (o'clock): 7 Maximum Distance: (cm) 3.4 Undermining: No Wound Description Classification: Full Thickness With Exposed Suppo Wound Margin: Distinct, outline attached Exudate Amount: Large Exudate Type: Serosanguineous Exudate Color: red, brown rt Structures Foul Odor After Cleansing: Yes Due to Product Use: Yes Slough/Fibrino Yes Wound Bed Granulation Amount: Large (67-100%) Exposed Structure Granulation Quality: Red Fascia Exposed: No Necrotic Amount: Small (1-33%) Fat Layer (Subcutaneous Tissue) Exposed: Yes Necrotic Quality: Adherent Slough Tendon Exposed: No Muscle Exposed: No Joint Exposed: No Bone Exposed: No Electronic Signature(s) Signed: 03/31/2022 5:44:03 PM By: Deon Pilling RN, BSN Signed: 07/28/2022 5:37:22 PM By: Rhae Hammock RN Entered By: Deon Pilling on 03/31/2022 14:12:49 [name], [name] ([id number]) [id number] Page 7 of 7 -------------------------------------------------------------------------------- Vitals Details Patient Name: Date of Service: [name], [name].  03/31/2022 1:45 PM Medical Record Number: [id number] Patient Account Number: [id number] Date of Birth/Sex: Treating RN: Jun 22, 1946 (77 y.o. [name], [name] Primary Care Landon Bassford: [name] Other  Clinician: Referring Rahaf Carbonell: Treating Dalia Jollie/Extender: [name] in Treatment: 9 Vital Signs Time Taken: 14:03 Temperature (F): 98.1 Height (in): 64 Reference Range: 80 - 120 mg / dl Weight (lbs): 180 Body Mass Index (BMI): 30.9 Electronic Signature(s) Signed: 07/28/2022 5:37:22 PM By: Rhae Hammock RN Entered By: Rhae Hammock on 03/31/2022 14:04:04

## 2022-07-29 NOTE — Telephone Encounter (Signed)
[employer].  PA submitted to [employer]

## 2022-07-30 ENCOUNTER — Inpatient Hospital Stay: Payer: Medicare HMO

## 2022-07-30 ENCOUNTER — Other Ambulatory Visit: Payer: Medicare HMO

## 2022-08-03 NOTE — Progress Notes (Signed)
ACIRE, TANG ([id number]) [id number] Page 1 of 7 Visit Report for 07/14/2022 Arrival Information Details Patient Name: Date of Service: [name], [name] 07/14/2022 12:45 PM Medical Record Number: [id number] Patient Account Number: [id number] Date of Birth/Sex: Treating RN: [name] 13, 1947 (77 y.o. [name] Primary Care Thania Woodlief: [name] Other Clinician: Referring Dunya Meiners: Treating Leif Loflin/Extender: [name] in Treatment: 24 Visit Information History Since Last Visit Added or deleted any medications: Yes Patient Arrived: Ambulatory Any new allergies or adverse reactions: No Arrival Time: 12:38 Had a fall or experienced change in No Accompanied By: Self activities of daily living that may affect Transfer Assistance: None risk of falls: Patient Identification Verified: Yes Signs or symptoms of abuse/neglect since last visito No Secondary Verification Process Completed: Yes Hospitalized since last visit: No Patient Requires Transmission-Based Precautions: No Implantable device outside of the clinic excluding No Patient Has Alerts: Yes cellular tissue based products placed in the center Patient Alerts: Patient on Blood Thinner since last visit: Has Dressing in Place as Prescribed: Yes Pain Present Now: No Electronic Signature(s) Signed: 08/02/2022 5:14:49 PM By: Sharyn Creamer RN, BSN Entered By: Sharyn Creamer on 07/14/2022 12:41:38 -------------------------------------------------------------------------------- Clinic Level of Care Assessment Details Patient Name: Date of Service: [name], [name] 07/14/2022 12:45 PM Medical Record Number: [id number] Patient Account Number: [id number] Date of Birth/Sex: Treating RN: [birth year] (77 y.o. [name], [name] Primary Care Ardith Lewman: [name] Other Clinician: Referring Nasirah Sachs: Treating Teresa Lemmerman/Extender: [name] in  Treatment: 24 Clinic Level of Care Assessment Items TOOL 4 Quantity Score X- 1 0 Use when only an EandM is performed on FOLLOW-UP visit ASSESSMENTS - Nursing Assessment / Reassessment X- 1 10 Reassessment of Co-morbidities (includes updates in patient status) X- 1 5 Reassessment of Adherence to Treatment Plan ASSESSMENTS - Wound and Skin A ssessment / Reassessment X - Simple Wound Assessment / Reassessment - one wound 1 5 '[]'$  - 0 Complex Wound Assessment / Reassessment - multiple wounds '[]'$  - 0 Dermatologic / Skin Assessment (not related to wound area) ASSESSMENTS - Focused Assessment '[]'$  - 0 Circumferential Edema Measurements - multi extremities '[]'$  - 0 Nutritional Assessment / Counseling / Intervention [name], [name] ([id number]) [id number] Page 2 of 7 '[]'$  - 0 Lower Extremity Assessment (monofilament, tuning fork, pulses) '[]'$  - 0 Peripheral Arterial Disease Assessment (using hand held doppler) ASSESSMENTS - Ostomy and/or Continence Assessment and Care '[]'$  - 0 Incontinence Assessment and Management '[]'$  - 0 Ostomy Care Assessment and Management (repouching, etc.) PROCESS - Coordination of Care X - Simple Patient / Family Education for ongoing care 1 15 '[]'$  - 0 Complex (extensive) Patient / Family Education for ongoing care X- 1 10 Staff obtains Programmer, systems, Records, T Results / Process Orders est '[]'$  - 0 Staff telephones HHA, Nursing Homes / Clarify orders / etc '[]'$  - 0 Routine Transfer to another Facility (non-emergent condition) '[]'$  - 0 Routine Hospital Admission (non-emergent condition) '[]'$  - 0 New Admissions / Biomedical engineer / Ordering NPWT Apligraf, etc. , '[]'$  - 0 Emergency Hospital Admission (emergent condition) X- 1 10 Simple Discharge Coordination '[]'$  - 0 Complex (extensive) Discharge Coordination PROCESS - Special Needs '[]'$  - 0 Pediatric / Minor Patient Management '[]'$  - 0 Isolation Patient Management '[]'$  - 0 Hearing / Language /  Visual special needs '[]'$  - 0 Assessment of Community assistance (transportation, D/C planning, etc.) '[]'$  - 0 Additional assistance / Altered mentation '[]'$  - 0 Support Surface(s) Assessment (bed, cushion, seat, etc.) INTERVENTIONS - Wound Cleansing /  Measurement X - Simple Wound Cleansing - one wound 1 5 '[]'$  - 0 Complex Wound Cleansing - multiple wounds X- 1 5 Wound Imaging (photographs - any number of wounds) '[]'$  - 0 Wound Tracing (instead of photographs) X- 1 5 Simple Wound Measurement - one wound '[]'$  - 0 Complex Wound Measurement - multiple wounds INTERVENTIONS - Wound Dressings X - Small Wound Dressing one or multiple wounds 1 10 '[]'$  - 0 Medium Wound Dressing one or multiple wounds '[]'$  - 0 Large Wound Dressing one or multiple wounds '[]'$  - 0 Application of Medications - topical '[]'$  - 0 Application of Medications - injection INTERVENTIONS - Miscellaneous '[]'$  - 0 External ear exam '[]'$  - 0 Specimen Collection (cultures, biopsies, blood, body fluids, etc.) '[]'$  - 0 Specimen(s) / Culture(s) sent or taken to Lab for analysis '[]'$  - 0 Patient Transfer (multiple staff / Civil Service fast streamer / Similar devices) '[]'$  - 0 Simple Staple / Suture removal (25 or less) '[]'$  - 0 Complex Staple / Suture removal (26 or more) '[]'$  - 0 Hypo / Hyperglycemic Management (close monitor of Blood Glucose) Sabas, Clay P ([id number]) [id number] Page 3 of 7 '[]'$  - 0 Ankle / Brachial Index (ABI) - do not check if billed separately X- 1 5 Vital Signs Has the patient been seen at the hospital within the last three years: Yes Total Score: 85 Level Of Care: New/Established - Level 3 Electronic Signature(s) Signed: 07/14/2022 5:39:23 PM By: Rhae Hammock RN Entered By: Rhae Hammock on 07/14/2022 13:00:29 -------------------------------------------------------------------------------- Encounter Discharge Information Details Patient Name: Date of Service: [name] 07/14/2022  12:45 PM Medical Record Number: [id number] Patient Account Number: [id number] Date of Birth/Sex: Treating RN: [birth year] (77 y.o. [name], [name] Primary Care Sundae Maners: [name] Other Clinician: Referring Vincenzo Stave: Treating Huzaifa Viney/Extender: [name] in Treatment: 24 Encounter Discharge Information Items Discharge Condition: Stable Ambulatory Status: Ambulatory Discharge Destination: Home Transportation: Private Auto Accompanied By: self Schedule Follow-up Appointment: Yes Clinical Summary of Care: Patient Declined Electronic Signature(s) Signed: 07/14/2022 5:39:23 PM By: Rhae Hammock RN Entered By: Rhae Hammock on 07/14/2022 13:00:58 -------------------------------------------------------------------------------- Lower Extremity Assessment Details Patient Name: Date of Service: [name], [name] 07/14/2022 12:45 PM Medical Record Number: [id number] Patient Account Number: [id number] Date of Birth/Sex: Treating RN: [birth year] (77 y.o. [name] Primary Care Arnoldo Hildreth: [name] Other Clinician: Referring Sarath Privott: Treating Mellina Benison/Extender: [name] in Treatment: 24 Electronic Signature(s) Signed: 08/02/2022 5:14:49 PM By: Sharyn Creamer RN, BSN Entered By: Sharyn Creamer on 07/14/2022 12:42:46 -------------------------------------------------------------------------------- [place] Details Patient Name: Date of Service: [name] 07/14/2022 12:45 PM Medical Record Number: [id number] Patient Account Number: [id number] [name], [name] ([id number]) [phone].pdf Page 4 of 7 Date of Birth/Sex: Treating RN: 10/10/1945 (77 y.o. [name], [name] Primary Care Theodore Rahrig: Other Clinician: [name] Referring Marleni Gallardo: Treating Sylvi Rybolt/Extender: [name] in Treatment: 24 Active  Inactive Wound/Skin Impairment Nursing Diagnoses: Impaired tissue integrity Knowledge deficit related to ulceration/compromised skin integrity Goals: Patient will have a decrease in wound volume by X% from date: (specify in notes) Date Initiated: 01/27/2022 Target Resolution Date: 07/19/2022 Goal Status: Active Patient/caregiver will verbalize understanding of skin care regimen Date Initiated: 01/27/2022 Date Inactivated: 05/05/2022 Target Resolution Date: 04/23/2022 Goal Status: Met Ulcer/skin breakdown will have a volume reduction of 30% by week 4 Date Initiated: 01/27/2022 Target Resolution Date: 07/19/2022 Goal Status: Active Interventions: Assess patient/caregiver ability to obtain necessary supplies Assess patient/caregiver ability to perform ulcer/skin care regimen upon  admission and as needed Assess ulceration(s) every visit Notes: Electronic Signature(s) Signed: 07/14/2022 5:39:23 PM By: Rhae Hammock RN Entered By: Rhae Hammock on 07/14/2022 12:43:05 -------------------------------------------------------------------------------- Pain Assessment Details Patient Name: Date of Service: [name], [name] 07/14/2022 12:45 PM Medical Record Number: [id number] Patient Account Number: [id number] Date of Birth/Sex: Treating RN: [birth year] (76 y.o. [name] Primary Care Socrates Cahoon: [name] Other Clinician: Referring Jadiel Schmieder: Treating Ryan Palermo/Extender: Renae Fickle Weeks in Treatment: 24 Active Problems Location of Pain Severity and Description of Pain Patient Has Paino No Site Locations [name], [name] P ([id number]) [id number] Page 5 of 7 Pain Management and Medication Current Pain Management: Electronic Signature(s) Signed: 08/02/2022 5:14:49 PM By: Sharyn Creamer RN, BSN Entered By: Sharyn Creamer on 07/14/2022  12:42:37 -------------------------------------------------------------------------------- Patient/Caregiver Education Details Patient Name: Date of Service: [name] 12/20/2023andnbsp12:45 PM Medical Record Number: [id number] Patient Account Number: [id number] Date of Birth/Gender: Treating RN: 01-24-1946 (77 y.o. [name], [name] Primary Care Physician: [name] Other Clinician: Referring Physician: Treating Physician/Extender: [name] in Treatment: 24 Education Assessment Education Provided To: Patient Education Topics Provided Wound/Skin Impairment: Methods: Explain/Verbal Responses: Reinforcements needed, State content correctly Electronic Signature(s) Signed: 07/14/2022 5:39:23 PM By: Rhae Hammock RN Entered By: Rhae Hammock on 07/14/2022 12:43:17 -------------------------------------------------------------------------------- Wound Assessment Details Patient Name: Date of Service: [name] 07/14/2022 12:45 PM Medical Record Number: [id number] Patient Account Number: [id number] Date of Birth/Sex: Treating RN: 1946/03/22 (77 y.o. [name] Primary Care Nikolaus Pienta: [name] Other Clinician: Referring Loella Hickle: Treating Carline Dura/Extender: Renae Fickle [place], [place] P ([id number]) [phone].pdf Page 6 of 7 Weeks in Treatment: 24 Wound Status Wound Number: 1 Primary Open Surgical Wound Etiology: Wound Location: Right Breast Secondary Calciphylaxis Wounding Event: Surgical Injury Etiology: Date Acquired: 08/26/2021 Wound Open Weeks Of Treatment: 24 Status: Clustered Wound: No Comorbid Asthma, Deep Vein Thrombosis, Hypertension, Peripheral Arterial History: Disease, Hepatitis B, Osteoarthritis, Neuropathy, Received Radiation Photos Wound Measurements Length: (cm) 0.5 Width: (cm) 0.2 Depth: (cm) 0.7 Area: (cm) 0.079 Volume: (cm) 0.055 %  Reduction in Area: -11.3% % Reduction in Volume: 22.5% Epithelialization: Medium (34-66%) Tunneling: Yes Position (o'clock): 10 Maximum Distance: (cm) 0.9 Undermining: No Wound Description Classification: Full Thickness With Exposed Suppo Wound Margin: Distinct, outline attached Exudate Amount: Medium Exudate Type: Serosanguineous Exudate Color: red, brown rt Structures Foul Odor After Cleansing: No Slough/Fibrino No Wound Bed Granulation Amount: Large (67-100%) Exposed Structure Granulation Quality: Red Fascia Exposed: No Necrotic Amount: None Present (0%) Fat Layer (Subcutaneous Tissue) Exposed: Yes Tendon Exposed: No Muscle Exposed: No Joint Exposed: No Bone Exposed: No Periwound Skin Texture Texture Color No Abnormalities Noted: Yes No Abnormalities Noted: Yes Moisture Temperature / Pain No Abnormalities Noted: Yes Temperature: No Abnormality Treatment Notes Wound #1 (Breast) Wound Laterality: Right Cleanser Normal Saline Discharge Instruction: Cleanse the wound with Normal Saline prior to applying a clean dressing using gauze sponges, not tissue or cotton balls. Wound Cleanser Discharge Instruction: Cleanse the wound with wound cleanser prior to applying a clean dressing using gauze sponges, not tissue or cotton balls. Peri-Wound Care Skin Prep Discharge Instruction: Use skin prep as directed RANDA, RISS ([id number]) [phone].pdf Page 7 of 7 Topical Primary Dressing Endoform 2x2 in Discharge Instruction: Moisten with saline Secondary Dressing Woven Gauze Sponge, Non-Sterile 4x4 in Discharge Instruction: In clinic Apply over primary dressing as directed. Zetuvit Plus Silicone Border Dressing 4x4 (in/in) Discharge Instruction: Apply silicone border over primary dressing as directed. Secured With Compression Wrap Compression Stockings Add-Ons Electronic  Signature(s) Signed: 08/02/2022 5:14:49 PM By: Sharyn Creamer RN,  BSN Entered By: Sharyn Creamer on 07/14/2022 12:45:47 -------------------------------------------------------------------------------- [place] Details Patient Name: Date of Service: [name] 07/14/2022 12:45 PM Medical Record Number: [id number] Patient Account Number: [id number] Date of Birth/Sex: Treating RN: Feb 20, 1946 (77 y.o. [name] Primary Care [place]ani Levenhagen: [name] Other Clinician: Referring Mckynlee Luse: Treating Denys Labree/Extender: [name] in Treatment: 24 Vital Signs Time Taken: 12:38 Temperature (F): 98.0 Height (in): 64 Pulse (bpm): 87 Weight (lbs): 180 Respiratory Rate (breaths/min): 18 Body Mass Index (BMI): 30.9 Blood Pressure (mmHg): 136/85 Reference Range: 80 - 120 mg / dl Electronic Signature(s) Signed: 08/02/2022 5:14:49 PM By: Sharyn Creamer RN, BSN Entered By: Sharyn Creamer on 07/14/2022 12:42:22

## 2022-08-04 ENCOUNTER — Ambulatory Visit (HOSPITAL_COMMUNITY)
Admission: RE | Admit: 2022-08-04 | Discharge: 2022-08-04 | Disposition: A | Discharge status: Home / Self Care | Payer: Medicare HMO | Source: Ambulatory Visit | Attending: Vascular Surgery | Admitting: Vascular Surgery

## 2022-08-04 ENCOUNTER — Encounter (HOSPITAL_BASED_OUTPATIENT_CLINIC_OR_DEPARTMENT_OTHER): Payer: Medicare HMO | Attending: Physician Assistant | Admitting: Physician Assistant

## 2022-08-04 ENCOUNTER — Ambulatory Visit: Payer: Medicare HMO | Admitting: Physician Assistant

## 2022-08-04 VITALS — BP 142/91 | HR 81 | Temp 97.6°F | Ht 65.0 in | Wt 188.0 lb

## 2022-08-04 DIAGNOSIS — T8131XA Disruption of external operation (surgical) wound, not elsewhere classified, initial encounter: Secondary | ICD-10-CM | POA: Diagnosis present

## 2022-08-04 DIAGNOSIS — I2510 Atherosclerotic heart disease of native coronary artery without angina pectoris: Secondary | ICD-10-CM | POA: Insufficient documentation

## 2022-08-04 DIAGNOSIS — D6861 Antiphospholipid syndrome: Secondary | ICD-10-CM | POA: Insufficient documentation

## 2022-08-04 DIAGNOSIS — I6529 Occlusion and stenosis of unspecified carotid artery: Secondary | ICD-10-CM | POA: Diagnosis not present

## 2022-08-04 DIAGNOSIS — Y838 Other surgical procedures as the cause of abnormal reaction of the patient, or of later complication, without mention of misadventure at the time of the procedure: Secondary | ICD-10-CM | POA: Diagnosis not present

## 2022-08-04 DIAGNOSIS — Z853 Personal history of malignant neoplasm of breast: Secondary | ICD-10-CM | POA: Diagnosis not present

## 2022-08-04 DIAGNOSIS — L598 Other specified disorders of the skin and subcutaneous tissue related to radiation: Secondary | ICD-10-CM | POA: Insufficient documentation

## 2022-08-04 DIAGNOSIS — I6523 Occlusion and stenosis of bilateral carotid arteries: Secondary | ICD-10-CM

## 2022-08-04 DIAGNOSIS — L98492 Non-pressure chronic ulcer of skin of other sites with fat layer exposed: Secondary | ICD-10-CM | POA: Diagnosis not present

## 2022-08-04 DIAGNOSIS — I10 Essential (primary) hypertension: Secondary | ICD-10-CM | POA: Diagnosis not present

## 2022-08-04 NOTE — Progress Notes (Addendum)
[name] ([id number]) [id number] Page 1 of 7 Visit Report for 08/04/2022 Chief Complaint Document Details Patient Name: Date of Service: [name], [name] 08/04/2022 2:15 PM Medical Record Number: [id number] Patient Account Number: [id number] Date of Birth/Sex: Treating RN: [birth year] (77 y.o. F) Primary Care Provider: [name] Other Clinician: Referring Provider: Treating Provider/Extender: [name] in Treatment: 27 Information Obtained from: Patient Chief Complaint Right breast ulcer Electronic Signature(s) Signed: 08/04/2022 2:06:55 PM By: Worthy Keeler PA-C Entered By: Worthy Keeler on 08/04/2022 14:06:55 -------------------------------------------------------------------------------- HPI Details Patient Name: Date of Service: [name] 08/04/2022 2:15 PM Medical Record Number: [id number] Patient Account Number: [id number] Date of Birth/Sex: Treating RN: 1946-07-04 (77 y.o. F) Primary Care Provider: [name] Other Clinician: Referring Provider: Treating Provider/Extender: [name] in Treatment: 27 History of Present Illness HPI Description: 01-27-2022 upon evaluation today patient presents for initial evaluation here in our clinic concerning issues she has been having with her right breast. She had an area of calcium buildup where she had actual staples that were being pushed out that were noted from her previous breast surgery which was roughly 23 years ago when she had cancer. At that point she had also undergone radiation therapy. 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 Nonetheless after her most recent surgery in February to remove the staples and the calcium deposit this has started to build back up due to the damage to the breast tissue. She was seen initially by Dr. Brantley Stage. Eventually he referred her to Dr.  Rebekah Chesterfield to see about a mastectomy to take care of the issue. With that being said this was something that was good to be reserved just for worst-case scenario in fact they were a little bit concerned about her reaction considering her blood thinners and the fact that she may not do as well and surgery is what they would like to see with all the other medical problems that she has. For that reason they referred her to [name] in order to see what we can do from a healing standpoint. 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 02-03-2022 upon evaluation today patient appears to be doing okay in regard to her wound on the breast. Fortunately I do not see any signs of worsening. Unfortunately I do believe that she is still having some discomfort also there was some misunderstanding about how to do the dressing which I think will make it a little bit easier she should be putting it in before waiting it and then after its in and situated she can wet this. I think that is going to be a lot easier for her than what she has been trying to do. 02-10-2022 upon evaluation today patient appears to be doing about the same in regard to her wound. She has been tolerating the dressing changes without complication. We are actually starting her on linezolid today actually had to apply to get this approved by her insurance I am hopeful that this will be affordable for her now it was going to cost her way too much before. With that being said I do believe that she is  headed in the right direction which is great news. No fevers, chills, nausea, vomiting, or diarrhea. 7/26; right breast apparently secondary to surgery earlier  this year for protruding staples and underlying traumatic calcifications. She has a nonhealing wound with some depth and tunneling. She has been using Hydrofera Blue with some improvement in measurements today. Nevertheless she has a wound VAC and we went ahead and applied this. I am not certain whether she is going to be eligible for home health but her staff are certainly going to work on it 02-24-2022 upon evaluation today patient appears to be doing excellent she is using the gauze VAC at this point and it seems to be doing a great job for her. Fortunately there does not appear to be any signs of active infection at this time which is excellent. No fever or chills noted 03-10-2022 upon evaluation today patient appears to be doing well with regard to her wound although the wound was definitely over packed compared to what it [name] P ([id number]) [phone].pdf Page 2 of 7 needs to be. I think they were probably got to switch to [facility] Blue rope which I think would be much easier to put him than the current black foam which is what home health is actually using we will try to do a gauze VAC but to be honest there is not actually doing that. Fortunately there does not appear to be any signs of active infection locally or systemically at this time which is great news. I think the biggest issue I see is simply that we need to make sure and have the patient have the Hydrofera Blue rope which is again I think a much better way to go with this and avoid altogether the use of the black foam inside the wound and put a piece on the outside that is appropriate and fine. 03-17-2022 upon evaluation today patient appears to be doing well currently in regard to her wound. The wound VAC does seem to be helping the [facility] seems to be much better and very pleased in this regard. I do not see any signs of active infection locally or systemically at this time which is great news.  No fevers, chills, nausea, vomiting, or diarrhea. 03-24-2022 upon evaluation today patient appears to be doing well with regard to her wound. I am seeing signs of improvement which is great news. Fortunately she is not doing any worse unfortunately they did not have the supplies and her wound VAC on until yesterday but otherwise she is doing excellent. 03-31-2022 upon evaluation today patient appears to be doing well currently in regard to her wound. I do feel like the wound VAC is doing well we may be getting close to the point of not utilizing the wound VAC any longer the patient states that she would be happy to get to that point. Fortunately I do not see any evidence of active infection locally or systemically which is great news. 04-07-2022 upon evaluation today patient appears to be doing well currently in regard to the wound on her right breast area. Fortunately there does not appear to be any signs of infection at this time which is great news and overall I am extremely pleased with where we stand today. No fevers, chills, nausea, vomiting, or diarrhea. 04-21-2022 upon evaluation today patient appears to be doing well currently in regard to her wound. She has been tolerating the dressing changes with the Hydrofera Blue without complication and overall I am extremely  pleased with where things stand today. There does not appear to be any evidence of active infection locally or systemically at this time which is great news. No fevers, chills, nausea, vomiting, or diarrhea. I do believe that she is headed in the right direction here. 05-05-2022 upon evaluation today patient actually appears to be doing excellent in regard to her wound. This is showing signs of great improvement and very pleased with where we stand. I do not see any signs of infection locally or systemically at this time which is great news. No fevers, chills, nausea, vomiting, or diarrhea. 10/18; small open area in the right breast  but with 1.7 cm of depth. Surgical wound in the setting of remote radiation greater than 20 years ago. Switch to endoform last week 05-26-2022 upon evaluation today patient appears to be doing well currently in regard to the overall appearance. Fortunately I do not see any signs of active infection locally or systemically at this time which is great news. No fevers, chills, nausea, vomiting, or diarrhea. 06-16-2022 upon evaluation today patient appears to be doing well currently in regard to her wound. She has been making excellent progress. Fortunately there does not appear to be any signs of infection locally nor systemically at this time which is great news. No fevers, chills, nausea, vomiting, or diarrhea. 07-14-2022 upon evaluation today patient appears to be doing well currently in regard to her wound. She has been tolerating the dressing changes without complication. Fortunately I think she is making good progress this is about the seal on the bottom of the wound but has not completely. 07-27-2022 upon evaluation today patient appears to be doing well with regard to her wound this area is getting very tight as far as the space is concerned is not nearly as often as it used to be. With that being said fortunately I do not see any evidence of infection locally or systemically at this time which is great news. No fevers, chills, nausea, vomiting, or diarrhea. Electronic Signature(s) Signed: 08/04/2022 2:48:49 PM By: Worthy Keeler PA-C Entered By: Worthy Keeler on 08/04/2022 14:48:49 -------------------------------------------------------------------------------- Physical Exam Details Patient Name: Date of Service: [name], [name] 08/04/2022 2:15 PM Medical Record Number: [id number] Patient Account Number: [id number] Date of Birth/Sex: Treating RN: 06-27-1946 (77 y.o. F) Primary Care Provider: [name] Other Clinician: Referring Provider: Treating Provider/Extender: Renae Fickle Weeks in Treatment: 56 Constitutional Well-nourished and well-hydrated in no acute distress. Respiratory normal breathing without difficulty. Psychiatric this patient is able to make decisions and demonstrates good insight into disease process. Alert and Oriented x 3. pleasant and cooperative. Notes Upon inspection patient's wound bed actually showed signs of good granulation epithelization at this point there does not appear to be any signs of active infection which is great news and overall I am extremely pleased with where we stand today.. Fortunately I think she is moving in the right direction still with the scar tissue this is just taking a while to heal. Electronic Signature(s) HASET, MONACHINO P ([id number]) [phone].pdf Page 3 of 7 Signed: 08/04/2022 2:49:10 PM By: Worthy Keeler PA-C Entered By: Worthy Keeler on 08/04/2022 14:49:10 -------------------------------------------------------------------------------- Physician Orders Details Patient Name: Date of Service: [name], [name] 08/04/2022 2:15 PM Medical Record Number: [id number] Patient Account Number: [id number] Date of Birth/Sex: Treating RN: [birth year] (77 y.o. [name], [name] Primary Care Provider: [name] Other Clinician: Referring Provider: Treating Provider/Extender: [name] in Treatment: 23 Verbal /  Phone Orders: No Diagnosis Coding ICD-10 Coding Code Description T81.31XA Disruption of external operation (surgical) wound, not elsewhere classified, initial encounter L98.492 Non-pressure chronic ulcer of skin of other sites with fat layer exposed E83.59 Other disorders of calcium metabolism L59.8 Other specified disorders of the skin and subcutaneous tissue related to radiation D68.61 Antiphospholipid syndrome I10 Essential (primary) hypertension I25.10 Atherosclerotic heart disease of native coronary artery without  angina pectoris Follow-up Appointments ppointment in 2 weeks. - w/ Jeri Cos on Wednesday's Return A Other: - Please follow up with your PCP for the pain/swelling of your right armpit. [place] [facility] for wound care. Wound Treatment Wound #1 - Breast Wound Laterality: Right Cleanser: Normal Saline (Generic) 1 x Per Day/30 Days Discharge Instructions: Cleanse the wound with Normal Saline prior to applying a clean dressing using gauze sponges, not tissue or cotton balls. Cleanser: Wound Cleanser (Generic) 1 x Per Day/30 Days Discharge Instructions: Cleanse the wound with wound cleanser prior to applying a clean dressing using gauze sponges, not tissue or cotton balls. Peri-Wound Care: Skin Prep (Generic) 1 x Per Day/30 Days Discharge Instructions: Use skin prep as directed Prim Dressing: Endoform 2x2 in (Generic) 1 x Per Day/30 Days ary Discharge Instructions: Moisten with saline Secondary Dressing: Woven Gauze Sponge, Non-Sterile 4x4 in (Generic) 1 x Per Day/30 Days Discharge Instructions: In clinic Apply over primary dressing as directed. Secondary Dressing: Zetuvit Plus Silicone Border Dressing 4x4 (in/in) (Generic) 1 x Per Day/30 Days Discharge Instructions: Apply silicone border over primary dressing as directed. Electronic Signature(s) Signed: 08/04/2022 4:42:12 PM By: Worthy Keeler PA-C Signed: 09/13/2022 10:40:58 AM By: [name] Hammock RN Entered By: [name] on 08/04/2022 14:36:41 Sidney, Lelon Frohlich ([id number] Page 4 of 7 -------------------------------------------------------------------------------- Problem List Details Patient Name: Date of Service: [name], [name] 08/04/2022 2:15 PM Medical Record Number: [id number] Patient Account Number: [id number] Date of Birth/Sex: Treating RN: [birth year] (77 y.o. F) Primary Care Provider: [name] Other Clinician: Referring Provider: Treating  Provider/Extender: [name] in Treatment: 27 Active Problems ICD-10 Encounter Code Description Active Date MDM Diagnosis T81.31XA Disruption of external operation (surgical) wound, not elsewhere classified, 01/27/2022 No Yes initial encounter L98.492 Non-pressure chronic ulcer of skin of other sites with fat layer exposed 01/27/2022 No Yes E83.59 Other disorders of calcium metabolism 01/27/2022 No Yes L59.8 Other specified disorders of the skin and subcutaneous tissue related to 01/27/2022 No Yes radiation D68.61 Antiphospholipid syndrome 01/27/2022 No Yes I10 Essential (primary) hypertension 01/27/2022 No Yes I25.10 Atherosclerotic heart disease of native coronary artery without angina pectoris 01/27/2022 No Yes Inactive Problems Resolved Problems Electronic Signature(s) Signed: 08/04/2022 2:06:47 PM By: Worthy Keeler PA-C Entered By: Worthy Keeler on 08/04/2022 14:06:47 -------------------------------------------------------------------------------- Progress Note Details Patient Name: Date of Service: [name] 08/04/2022 2:15 PM Medical Record Number: [id number] Patient Account Number: [id number] Date of Birth/Sex: Treating RN: [birth year] (77 y.o. F) Primary Care Provider: [name] Other Clinician: Referring Provider: Treating Provider/Extender: Regenia Skeeter, Chauncey Fischer in Treatment: [address], [place] ([id number]) [id number] Page 5 of 7 Subjective Chief Complaint Information obtained from Patient Right breast ulcer History of Present Illness (HPI) 01-27-2022 upon evaluation today patient presents for initial evaluation here in our clinic concerning issues she has been having with her right breast. She had an area of calcium buildup where she had actual staples that were being pushed out that were noted from her previous breast surgery which was roughly 23 years ago when she had  cancer. At that point  she had also undergone radiation therapy. 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 Nonetheless after her most recent surgery in February to remove the staples and the calcium deposit this has started to build back up due to the damage to the breast tissue. She was seen initially by Dr. Brantley Stage. Eventually he referred her to Dr. Rebekah Chesterfield to see about a mastectomy to take care of the issue. With that being said this was something that was good to be reserved just for worst-case scenario in fact they were a little bit concerned about her reaction considering her blood thinners and the fact that she may not do as well and surgery is what they would like to see with all the other medical problems that she has. For that reason they referred her to [name] in order to see what we can do from a healing standpoint. 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 02-03-2022 upon evaluation today patient appears to be doing okay in regard to her wound on the breast. Fortunately I do not see any signs of worsening. Unfortunately I do believe that she is still having some discomfort also there was some misunderstanding about how to do the dressing which I think will make it a little bit easier she should be putting it in before waiting it and then after its in and situated she can wet this. I think that is going to be a lot easier for her than what she has been trying to do. 02-10-2022 upon evaluation today patient appears to be doing about the same in regard to her wound. She has been tolerating the dressing changes without complication. We are actually starting her on linezolid  today actually had to apply to get this approved by her insurance I am hopeful that this will be affordable for her now it was going to cost her way too much before. With that being said I do believe that she is headed in the right direction which is great news. No fevers, chills, nausea, vomiting, or diarrhea. 7/26; right breast apparently secondary to surgery earlier this year for protruding staples and underlying traumatic calcifications. She has a nonhealing wound with some depth and tunneling. She has been using Hydrofera Blue with some improvement in measurements today. Nevertheless she has a wound VAC and we went ahead and applied this. I am not certain whether she is going to be eligible for home health but her staff are certainly going to work on it 02-24-2022 upon evaluation today patient appears to be doing excellent she is using the gauze VAC at this point and it seems to be doing a great job for her. Fortunately there does not appear to be any signs of active infection at this time which is excellent. No fever or chills noted 03-10-2022 upon evaluation today patient appears to be doing well with regard to her wound although the wound was definitely over packed compared to what it needs to be. I think they were probably got to switch to [facility] Blue rope which I think would be much easier to put him than the current black foam which is what home health  is actually using we will try to do a gauze VAC but to be honest there is not actually doing that. Fortunately there does not appear to be any signs of active infection locally or systemically at this time which is great news. I think the biggest issue I see is simply that we need to make sure and have the patient have the Hydrofera Blue rope which is again I think a much better way to go with this and avoid altogether the use of the black foam inside the wound and put a piece on the outside that is appropriate and fine. 03-17-2022 upon  evaluation today patient appears to be doing well currently in regard to her wound. The wound VAC does seem to be helping the [facility] seems to be much better and very pleased in this regard. I do not see any signs of active infection locally or systemically at this time which is great news. No fevers, chills, nausea, vomiting, or diarrhea. 03-24-2022 upon evaluation today patient appears to be doing well with regard to her wound. I am seeing signs of improvement which is great news. Fortunately she is not doing any worse unfortunately they did not have the supplies and her wound VAC on until yesterday but otherwise she is doing excellent. 03-31-2022 upon evaluation today patient appears to be doing well currently in regard to her wound. I do feel like the wound VAC is doing well we may be getting close to the point of not utilizing the wound VAC any longer the patient states that she would be happy to get to that point. Fortunately I do not see any evidence of active infection locally or systemically which is great news. 04-07-2022 upon evaluation today patient appears to be doing well currently in regard to the wound on her right breast area. Fortunately there does not appear to be any signs of infection at this time which is great news and overall I am extremely pleased with where we stand today. No fevers, chills, nausea, vomiting, or diarrhea. 04-21-2022 upon evaluation today patient appears to be doing well currently in regard to her wound. She has been tolerating the dressing changes with the [facility] without complication and overall I am extremely pleased with where things stand today. There does not appear to be any evidence of active infection locally or systemically at this time which is great news. No fevers, chills, nausea, vomiting, or diarrhea. I do believe that she is headed in the right direction here. 05-05-2022 upon evaluation today patient actually appears to be doing  excellent in regard to her wound. This is showing signs of great improvement and very pleased with where we stand. I do not see any signs of infection locally or systemically at this time which is great news. No fevers, chills, nausea, vomiting, or diarrhea. 10/18; small open area in the right breast but with 1.7 cm of depth. Surgical wound in the setting of remote radiation greater than 20 years ago. Switch to endoform last week 05-26-2022 upon evaluation today patient appears to be doing well currently in regard to the overall appearance. Fortunately I do not see any signs of active infection locally or systemically at this time which is great news. No fevers, chills, nausea, vomiting, or diarrhea. 06-16-2022 upon evaluation today patient appears to be doing well currently in regard to her wound. She has been making excellent progress. Fortunately there does not appear to be any signs of infection locally nor systemically at this  time which is great news. No fevers, chills, nausea, vomiting, or diarrhea. 07-14-2022 upon evaluation today patient appears to be doing well currently in regard to her wound. She has been tolerating the dressing changes without complication. Fortunately I think she is making good progress this is about the seal on the bottom of the wound but has not completely. 07-27-2022 upon evaluation today patient appears to be doing well with regard to her wound this area is getting very tight as far as the space is concerned is not nearly as often as it used to be. With that being said fortunately I do not see any evidence of infection locally or systemically at this time which is great news. No fevers, chills, nausea, vomiting, or diarrhea. [name], [name] ([id number]) [id number] Page 6 of 7 Objective Constitutional Well-nourished and well-hydrated in no acute distress. Vitals Time Taken: 2:07 PM, Height: 64 in, Weight: 180 lbs, BMI: 30.9, Temperature:  98.7 F, Pulse: 74 bpm, Respiratory Rate: 17 breaths/min, Blood Pressure: 123/86 mmHg. Respiratory normal breathing without difficulty. Psychiatric this patient is able to make decisions and demonstrates good insight into disease process. Alert and Oriented x 3. pleasant and cooperative. General Notes: Upon inspection patient's wound bed actually showed signs of good granulation epithelization at this point there does not appear to be any signs of active infection which is great news and overall I am extremely pleased with where we stand today.. Fortunately I think she is moving in the right direction still with the scar tissue this is just taking a while to heal. Integumentary (Hair, Skin) Wound #1 status is Open. Original cause of wound was Surgical Injury. The date acquired was: 08/26/2021. The wound has been in treatment 27 weeks. The wound is located on the Right Breast. The wound measures 0.3cm length x 0.2cm width x 0.5cm depth; 0.047cm^2 area and 0.024cm^3 volume. There is Fat Layer (Subcutaneous Tissue) exposed. There is no tunneling noted, however, there is undermining starting at 10:00 and ending at 12:00 with a maximum distance of 1cm. There is a medium amount of serosanguineous drainage noted. The wound margin is distinct with the outline attached to the wound base. There is large (67-100%) red granulation within the wound bed. There is no necrotic tissue within the wound bed. The periwound skin appearance had no abnormalities noted for texture. The periwound skin appearance had no abnormalities noted for moisture. The periwound skin appearance had no abnormalities noted for color. Periwound temperature was noted as No Abnormality. The periwound has tenderness on palpation. 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 - w/ Jeri Cos on Wednesday's Other: - Please follow up with your PCP for the pain/swelling of your right armpit. Home Health: [facility] for wound care. WOUND #1: - Breast Wound Laterality: Right Cleanser: Normal Saline (Generic) 1 x Per Day/30 Days Discharge Instructions: Cleanse the wound with Normal Saline prior to applying a clean dressing using gauze sponges, not tissue or cotton balls. Cleanser: Wound Cleanser (Generic) 1 x Per Day/30 Days Discharge Instructions: Cleanse the wound with wound cleanser prior to applying a clean dressing using gauze sponges, not tissue or cotton balls. Peri-Wound  Care: Skin Prep (Generic) 1 x Per Day/30 Days Discharge Instructions: Use skin prep as directed Prim Dressing: Endoform 2x2 in (Generic) 1 x Per Day/30 Days ary Discharge Instructions: Moisten with saline Secondary Dressing: Woven Gauze Sponge, Non-Sterile 4x4 in (Generic) 1 x Per Day/30 Days Discharge Instructions: In clinic Apply over primary dressing as directed. Secondary Dressing: Zetuvit Plus Silicone Border Dressing 4x4 (in/in) (Generic) 1 x Per Day/30 Days Discharge Instructions: Apply silicone border over primary dressing as directed. 1. I would recommend that we have the patient going continue to monitor for any signs of infection or worsening. Office if anything changes she knows to let me know but otherwise going to continue to monitor for things. 2. I would recommend as well that the patient should go ahead and continue to utilize the endoform which I think is doing a good job here. 3. I am also going to suggest that she continue with the Zetuvit to  cover. 4. With regard to the lymph node that was noted in the right axillary region I did check this compared to the left and again I do not feel anything on the left. Nonetheless I think this may be good for having her primary care provider check this out and see if there is anything T wise the need to do to further est evaluate the area although she tells me even if there is something going on she will do anything about it. We will see patient back for reevaluation in 1 week here in the clinic. If anything worsens or changes patient will contact our office for additional recommendations. [name], [name] ([id number]) [id number] Page 7 of 7 Electronic Signature(s) Signed: 08/04/2022 2:53:18 PM By: Worthy Keeler PA-C Entered By: Worthy Keeler on 08/04/2022 14:53:18 -------------------------------------------------------------------------------- SuperBill Details Patient Name: Date of Service: [name], [name] 08/04/2022 Medical Record Number: [id number] Patient Account Number: [id number] Date of Birth/Sex: Treating RN: 1945-11-20 (77 y.o. 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 D68.61 Antiphospholipid syndrome I10 Essential (primary) hypertension I25.10 Atherosclerotic heart disease of native coronary artery without angina pectoris Facility Procedures : CPT4 Code: [id number] Description: 99213 - WOUND CARE VISIT-LEV 3 EST PT Modifier: Quantity: 1 Physician Procedures :  CPT4 Code Description Modifier [id number] - WC PHYS LEVEL 3 - EST PT ICD-10 Diagnosis Description T81.31XA Disruption of external operation (surgical) wound, not elsewhere classified, initial encounter L98.492 Non-pressure chronic ulcer of skin of  other sites with fat layer exposed E83.59 Other disorders of calcium metabolism L59.8 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

## 2022-08-04 NOTE — Progress Notes (Signed)
Office Note   History of Present Illness   [name] is a 77 y.o. (11-28-1945) female who presents for surveillance of carotid artery stenosis.  She has a history of right CEA on 04/23/2021 for asymptomatic carotid artery stenosis.   She was last seen in December 2022 and had some difficulties with swallowing and hoarseness after a URI.  She felt that her symptoms were related to her typical esophageal dysmotility and she may have been in need of esophageal dilation.  She was not having any neurologic symptoms.  At follow-up today, she states that her hoarseness is much better.  She had an esophageal dilation in 2023.  She denies any pain in her feet or legs when walking or wounds of the lower extremities.  She denies any strokelike symptoms.  She is still taking her Coumadin and statin.  Current Outpatient Medications  Medication Sig Dispense Refill   ALPRAZolam (XANAX) 1 MG tablet Take 1 mg by mouth 2 (two) times daily.      Ascorbic Acid (VITAMIN C) 1000 MG tablet Take 1,000 mg by mouth daily.     aspirin 81 MG tablet Take 81 mg by mouth every evening.     Cyanocobalamin (VITAMIN B 12) 500 MCG TABS Take 500 mcg by mouth daily.     EPINEPHrine 0.3 mg/0.3 mL IJ SOAJ injection Inject 0.3 mg into the muscle as needed for anaphylaxis. 1 each 0   Evolocumab with Infusor ([place]) 420 MG/3.5ML SOCT Inject 420 mg into the skin every 30 (thirty) days. 10.8 mL 3   fluticasone (FLONASE) 50 MCG/ACT nasal spray Place 2 sprays into the nose daily as needed for allergies.      HYDROcodone-acetaminophen (NORCO/VICODIN) 5-325 MG tablet Take 1 tablet by mouth every 6 (six) hours as needed for moderate pain. 15 tablet 0   levothyroxine (SYNTHROID, LEVOTHROID) 112 MCG tablet Take 112 mcg by mouth See admin instructions. Take 112 mcg by mouth daily on Sun, Tues, Wed, Thurs, Sat     levothyroxine (SYNTHROID, LEVOTHROID) 125 MCG tablet Take 125 mcg by mouth See admin instructions.  Take 125 mcg by mouth on Monday and Friday     magnesium oxide (MAG-OX) 400 MG tablet Take 400 mg by mouth daily.     metoprolol tartrate (LOPRESSOR) 25 MG tablet Take 12.5 mg by mouth 2 (two) times daily.     omeprazole (PRILOSEC) 20 MG capsule Take 20 mg by mouth daily.      warfarin (COUMADIN) 5 MG tablet Take 5 mg daily except on Mondays: take 2.5 mg (1/2 tablet) 90 tablet 3   No current facility-administered medications for this visit.    REVIEW OF SYSTEMS (negative unless checked):   Cardiac:  '[]'$  Chest pain or chest pressure? '[]'$  Shortness of breath upon activity? '[]'$  Shortness of breath when lying flat? '[]'$  Irregular heart rhythm?  Vascular:  '[]'$  Pain in calf, thigh, or hip brought on by walking? '[]'$  Pain in feet at night that wakes you up from your sleep? '[]'$  Blood clot in your veins? '[]'$  Leg swelling?  Pulmonary:  '[]'$  Oxygen at home? '[]'$  Productive cough? '[]'$  Wheezing?  Neurologic:  '[]'$  Sudden weakness in arms or legs? '[]'$  Sudden numbness in arms or legs? '[]'$  Sudden onset of difficult speaking or slurred speech? '[]'$  Temporary loss of vision in one eye? '[]'$  Problems with dizziness?  Gastrointestinal:  '[]'$  Blood in stool? '[]'$  Vomited blood?  Genitourinary:  '[]'$  Burning when urinating? '[]'$  Blood in urine?  Psychiatric:  '[]'$  Major depression  Hematologic:  '[]'$  Bleeding problems? '[]'$  Problems with blood clotting?  Dermatologic:  '[]'$  Rashes or ulcers?  Constitutional:  '[]'$  Fever or chills?  Ear/Nose/Throat:  '[]'$  Change in hearing? '[]'$  Nose bleeds? '[]'$  Sore throat?  Musculoskeletal:  '[]'$  Back pain? '[]'$  Joint pain? '[]'$  Muscle pain?   Physical Examination   Vitals:   08/04/22 0919  BP: (!) 142/91  Pulse: 81  Temp: 97.6 F (36.4 C)  TempSrc: Temporal  SpO2: 96%  Weight: 188 lb (85.3 kg)  Height: '5\' 5"'$  (1.651 m)   Body mass index is 31.28 kg/m.  General:  WDWN in NAD; vital signs documented above Gait: Not observed HENT: WNL, normocephalic Pulmonary: normal  non-labored breathing  Cardiac: regular rate and rhythm, no carotid bruit Abdomen: soft, NT, no masses Skin: without rashes Vascular Exam/Pulses: Palpable DP pulses bilaterally. Palpable radial pulses 2+ Extremities: without ischemic changes, without gangrene , with cellulitis; without open wounds;  Musculoskeletal: no muscle wasting or atrophy  Neurologic: A&O X 3;  No focal weakness or paresthesias are detected Psychiatric:  The pt has Normal affect.  Non-Invasive Vascular Imaging   B Carotid Duplex (08/04/2022):  R ICA stenosis:  1-39% R VA:  patent and antegrade L ICA stenosis:  1-39% L VA:  patent and antegrade   Medical Decision Making   [name] is a 77 y.o. female who presents for surveillance of carotid artery stenosis  Based on the patient's vascular studies, her carotid artery stenosis is unchanged and 1 to 39% bilaterally. She denies any neurologic events such as slurred speech, weakness/numbness, or amaurosis fugax She is still taking her Coumadin and statin.  She has no health issues.  Her hoarseness has resolved since her last visit with [name] in 2022 She will continue her current medications and follow-up with [name] in 1 year with bilateral carotid artery duplex study  Vicente Serene PA-C [facility]: [facility] MD: [facility]

## 2022-08-18 ENCOUNTER — Encounter (HOSPITAL_BASED_OUTPATIENT_CLINIC_OR_DEPARTMENT_OTHER): Payer: Medicare HMO | Admitting: Physician Assistant

## 2022-08-18 DIAGNOSIS — T8131XA Disruption of external operation (surgical) wound, not elsewhere classified, initial encounter: Secondary | ICD-10-CM | POA: Diagnosis not present

## 2022-08-18 NOTE — Progress Notes (Signed)
KEYMONI, MCCASTER ([id number]) [id number] Page 1 of 7 Visit Report for 08/18/2022 Chief Complaint Document Details Patient Name: Date of Service: [name], [name] 08/18/2022 12:30 PM Medical Record Number: [id number] Patient Account Number: [id number] Date of Birth/Sex: Treating RN: 1945/12/25 (77 y.o. F) Primary Care Provider: [name] Other Clinician: Referring Provider: Treating Provider/Extender: [name] in Treatment: 29 Information Obtained from: Patient Chief Complaint Right breast ulcer Electronic Signature(s) Signed: 08/18/2022 12:56:19 PM By: Worthy Keeler PA-C Entered By: Worthy Keeler on 08/18/2022 31:54:00 -------------------------------------------------------------------------------- HPI Details Patient Name: Date of Service: [name], [name] 08/18/2022 12:30 PM Medical Record Number: [id number] Patient Account Number: [id number] Date of Birth/Sex: Treating RN: [birth year] (77 y.o. F) Primary Care Provider: [name] Other Clinician: Referring Provider: Treating Provider/Extender: [name] in Treatment: 29 History of Present Illness HPI Description: 01-27-2022 upon evaluation today patient presents for initial evaluation here in our clinic concerning issues she has been having with her right breast. She had an area of calcium buildup where she had actual staples that were being pushed out that were noted from her previous breast surgery which was roughly 23 years ago when she had cancer. At that point she had also undergone radiation therapy. 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 Nonetheless after her most recent surgery in February to remove the staples and the calcium deposit this has started to build back up due to the damage to the breast tissue. She was seen initially by Dr. Brantley Stage. Eventually he referred her to Dr.  Rebekah Chesterfield to see about a mastectomy to take care of the issue. With that being said this was something that was good to be reserved just for worst-case scenario in fact they were a little bit concerned about her reaction considering her blood thinners and the fact that she may not do as well and surgery is what they would like to see with all the other medical problems that she has. For that reason they referred her to [name] in order to see what we can do from a healing standpoint. 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 02-03-2022 upon evaluation today patient appears to be doing okay in regard to her wound on the breast. Fortunately I do not see any signs of worsening. Unfortunately I do believe that she is still having some discomfort also there was some misunderstanding about how to do the dressing which I think will make it a little bit easier she should be putting it in before waiting it and then after its in and situated she can wet this. I think that is going to be a lot easier for her than what she has been trying to do. 02-10-2022 upon evaluation today patient appears to be doing about the same in regard to her wound. She has been tolerating the dressing changes without complication. We are actually starting her on linezolid today actually had to apply to get this approved by her insurance I am hopeful that this will be affordable for her now it was going to cost her way too much before. With that being said I do believe that she is  headed in the right direction which is great news. No fevers, chills, nausea, vomiting, or diarrhea. 7/26; right breast apparently secondary to surgery earlier  this year for protruding staples and underlying traumatic calcifications. She has a nonhealing wound with some depth and tunneling. She has been using Hydrofera Blue with some improvement in measurements today. Nevertheless she has a wound VAC and we went ahead and applied this. I am not certain whether she is going to be eligible for home health but her staff are certainly going to work on it 02-24-2022 upon evaluation today patient appears to be doing excellent she is using the gauze VAC at this point and it seems to be doing a great job for her. Fortunately there does not appear to be any signs of active infection at this time which is excellent. No fever or chills noted 03-10-2022 upon evaluation today patient appears to be doing well with regard to her wound although the wound was definitely over packed compared to what it [name], [name] P ([id number]) [id number] Page 2 of 7 needs to be. I think they were probably got to switch to [facility] Blue rope which I think would be much easier to put him than the current black foam which is what home health is actually using we will try to do a gauze VAC but to be honest there is not actually doing that. Fortunately there does not appear to be any signs of active infection locally or systemically at this time which is great news. I think the biggest issue I see is simply that we need to make sure and have the patient have the Hydrofera Blue rope which is again I think a much better way to go with this and avoid altogether the use of the black foam inside the wound and put a piece on the outside that is appropriate and fine. 03-17-2022 upon evaluation today patient appears to be doing well currently in regard to her wound. The wound VAC does seem to be helping the [facility] seems to be much better and very pleased in this regard. I do not see any signs of active infection locally or systemically at this time which is great news.  No fevers, chills, nausea, vomiting, or diarrhea. 03-24-2022 upon evaluation today patient appears to be doing well with regard to her wound. I am seeing signs of improvement which is great news. Fortunately she is not doing any worse unfortunately they did not have the supplies and her wound VAC on until yesterday but otherwise she is doing excellent. 03-31-2022 upon evaluation today patient appears to be doing well currently in regard to her wound. I do feel like the wound VAC is doing well we may be getting close to the point of not utilizing the wound VAC any longer the patient states that she would be happy to get to that point. Fortunately I do not see any evidence of active infection locally or systemically which is great news. 04-07-2022 upon evaluation today patient appears to be doing well currently in regard to the wound on her right breast area. Fortunately there does not appear to be any signs of infection at this time which is great news and overall I am extremely pleased with where we stand today. No fevers, chills, nausea, vomiting, or diarrhea. 04-21-2022 upon evaluation today patient appears to be doing well currently in regard to her wound. She has been tolerating the dressing changes with the Hydrofera Blue without complication and overall I am extremely  pleased with where things stand today. There does not appear to be any evidence of active infection locally or systemically at this time which is great news. No fevers, chills, nausea, vomiting, or diarrhea. I do believe that she is headed in the right direction here. 05-05-2022 upon evaluation today patient actually appears to be doing excellent in regard to her wound. This is showing signs of great improvement and very pleased with where we stand. I do not see any signs of infection locally or systemically at this time which is great news. No fevers, chills, nausea, vomiting, or diarrhea. 10/18; small open area in the right breast  but with 1.7 cm of depth. Surgical wound in the setting of remote radiation greater than 20 years ago. Switch to endoform last week 05-26-2022 upon evaluation today patient appears to be doing well currently in regard to the overall appearance. Fortunately I do not see any signs of active infection locally or systemically at this time which is great news. No fevers, chills, nausea, vomiting, or diarrhea. 06-16-2022 upon evaluation today patient appears to be doing well currently in regard to her wound. She has been making excellent progress. Fortunately there does not appear to be any signs of infection locally nor systemically at this time which is great news. No fevers, chills, nausea, vomiting, or diarrhea. 07-14-2022 upon evaluation today patient appears to be doing well currently in regard to her wound. She has been tolerating the dressing changes without complication. Fortunately I think she is making good progress this is about the seal on the bottom of the wound but has not completely. 07-27-2022 upon evaluation today patient appears to be doing well with regard to her wound this area is getting very tight as far as the space is concerned is not nearly as often as it used to be. With that being said fortunately I do not see any evidence of infection locally or systemically at this time which is great news. No fevers, chills, nausea, vomiting, or diarrhea. 08-18-2022 upon evaluation today patient's wound is actually showing signs of excellent improvement. We were considering hyperbarics but I feel like she is very close to resolution at this point therefore we are going to hold off on that for the time being. Electronic Signature(s) Signed: 08/18/2022 5:11:11 PM By: Worthy Keeler PA-C Entered By: Worthy Keeler on 08/18/2022 17:11:11 -------------------------------------------------------------------------------- Physical Exam Details Patient Name: Date of Service: [name], [name]  08/18/2022 12:30 PM Medical Record Number: [id number] Patient Account Number: [id number] Date of Birth/Sex: Treating RN: [birth year] (77 y.o. F) Primary Care Provider: [name] Other Clinician: Referring Provider: Treating Provider/Extender: Renae Fickle Weeks in Treatment: 29 Constitutional Well-nourished and well-hydrated in no acute distress. Respiratory normal breathing without difficulty. Psychiatric this patient is able to make decisions and demonstrates good insight into disease process. Alert and Oriented x 3. pleasant and cooperative. Notes Upon inspection patient's wound bed actually showed signs of significant improvement both in the diameter of the wound as well as the depth of the wound. I think that she is doing very well and it is becoming hard even packing the endoform which is a great problem to have here. ANNETT, BOXWELL ([id number]) [id number] Page 3 of 7 Electronic Signature(s) Signed: 08/18/2022 5:11:31 PM By: Worthy Keeler PA-C Entered By: Worthy Keeler on 08/18/2022 17:11:31 -------------------------------------------------------------------------------- Physician Orders Details Patient Name: Date of Service: [name], [name] 08/18/2022 12:30 PM Medical Record Number: [id number] Patient Account Number: [id number] Date of  Birth/Sex: Treating RN: [birth year] (77 y.o. [name], [name] Primary Care Provider: [name] Other Clinician: Referring Provider: Treating Provider/Extender: [name] in Treatment: 29 Verbal / Phone Orders: No Diagnosis Coding ICD-10 Coding Code Description T81.31XA Disruption of external operation (surgical) wound, not elsewhere classified, initial encounter L98.492 Non-pressure chronic ulcer of skin of other sites with fat layer exposed E83.59 Other disorders of calcium metabolism L59.8 Other specified disorders of the skin and subcutaneous  tissue related to radiation D68.61 Antiphospholipid syndrome I10 Essential (primary) hypertension I25.10 Atherosclerotic heart disease of native coronary artery without angina pectoris Follow-up Appointments ppointment in 2 weeks. - w/ Jeri Cos on Wednesday's Return A Other: - Please follow up with your PCP for the pain/swelling of your right armpit. [facility] for wound care. Wound Treatment Wound #1 - Breast Wound Laterality: Right Cleanser: Normal Saline (Generic) 1 x Per Day/30 Days Discharge Instructions: Cleanse the wound with Normal Saline prior to applying a clean dressing using gauze sponges, not tissue or cotton balls. Cleanser: Wound Cleanser (Generic) 1 x Per Day/30 Days Discharge Instructions: Cleanse the wound with wound cleanser prior to applying a clean dressing using gauze sponges, not tissue or cotton balls. Peri-Wound Care: Skin Prep (Generic) 1 x Per Day/30 Days Discharge Instructions: Use skin prep as directed Prim Dressing: Endoform 2x2 in (Generic) 1 x Per Day/30 Days ary Discharge Instructions: Moisten with saline Secondary Dressing: Woven Gauze Sponge, Non-Sterile 4x4 in (Generic) 1 x Per Day/30 Days Discharge Instructions: In clinic Apply over primary dressing as directed. Secondary Dressing: Zetuvit Plus Silicone Border Dressing 4x4 (in/in) (Generic) 1 x Per Day/30 Days Discharge Instructions: Apply silicone border over primary dressing as directed. Electronic Signature(s) Signed: 08/18/2022 4:09:44 PM By: Rhae Hammock RN Signed: 08/18/2022 5:48:31 PM By: Worthy Keeler PA-C Entered By: Rhae Hammock on 08/18/2022 13:03:48 Randle, Lelon Frohlich ([id number]) [id number] Page 4 of 7 -------------------------------------------------------------------------------- Problem List Details Patient Name: Date of Service: [name], [name] 08/18/2022 12:30 PM Medical Record Number: [id number] Patient Account  Number: [id number] Date of Birth/Sex: Treating RN: 1945/09/28 (77 y.o. [name], Meta.Reding Primary Care Provider: [name] Other Clinician: Referring Provider: Treating Provider/Extender: [name] in Treatment: 29 Active Problems ICD-10 Encounter Code Description Active Date MDM Diagnosis T81.31XA Disruption of external operation (surgical) wound, not elsewhere classified, 01/27/2022 No Yes initial encounter L98.492 Non-pressure chronic ulcer of skin of other sites with fat layer exposed 01/27/2022 No Yes E83.59 Other disorders of calcium metabolism 01/27/2022 No Yes L59.8 Other specified disorders of the skin and subcutaneous tissue related to 01/27/2022 No Yes radiation D68.61 Antiphospholipid syndrome 01/27/2022 No Yes I10 Essential (primary) hypertension 01/27/2022 No Yes I25.10 Atherosclerotic heart disease of native coronary artery without angina pectoris 01/27/2022 No Yes Inactive Problems Resolved Problems Electronic Signature(s) Signed: 08/18/2022 12:56:13 PM By: Worthy Keeler PA-C Entered By: Worthy Keeler on 08/18/2022 12:56:13 -------------------------------------------------------------------------------- Progress Note Details Patient Name: Date of Service: [name], [name] 08/18/2022 12:30 PM Medical Record Number: [id number] Patient Account Number: [id number] Date of Birth/Sex: Treating RN: November 03, 1945 (77 y.o. F) Primary Care Provider: [name] Other Clinician: Referring Provider: Treating Provider/Extender: Regenia Skeeter, Chauncey Fischer in Treatment: [address] [place] ([id number]) [id number] Page 5 of 7 Subjective Chief Complaint Information obtained from Patient Right breast ulcer History of Present Illness (HPI) 01-27-2022 upon evaluation today patient presents for initial evaluation here in our clinic concerning issues she has been having with her right breast. She  had an area of calcium  buildup where she had actual staples that were being pushed out that were noted from her previous breast surgery which was roughly 23 years ago when she had cancer. At that point she had also undergone radiation therapy. 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 Nonetheless after her most recent surgery in February to remove the staples and the calcium deposit this has started to build back up due to the damage to the breast tissue. She was seen initially by Dr. Brantley Stage. Eventually he referred her to Dr. Rebekah Chesterfield to see about a mastectomy to take care of the issue. With that being said this was something that was good to be reserved just for worst-case scenario in fact they were a little bit concerned about her reaction considering her blood thinners and the fact that she may not do as well and surgery is what they would like to see with all the other medical problems that she has. For that reason they referred her to [name] in order to see what we can do from a healing standpoint. 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 02-03-2022 upon evaluation today patient appears to be doing okay in regard to her wound on the breast. Fortunately I do not see any signs of worsening. Unfortunately I do believe that she is still having some discomfort also there was some misunderstanding about how to do the dressing which I think will make it a little bit easier she should be putting it in before waiting it and then after its in and situated she can wet this. I think that is going to be a lot easier for her than what she has been trying to do. 02-10-2022 upon evaluation today  patient appears to be doing about the same in regard to her wound. She has been tolerating the dressing changes without complication. We are actually starting her on linezolid today actually had to apply to get this approved by her insurance I am hopeful that this will be affordable for her now it was going to cost her way too much before. With that being said I do believe that she is headed in the right direction which is great news. No fevers, chills, nausea, vomiting, or diarrhea. 7/26; right breast apparently secondary to surgery earlier this year for protruding staples and underlying traumatic calcifications. She has a nonhealing wound with some depth and tunneling. She has been using Hydrofera Blue with some improvement in measurements today. Nevertheless she has a wound VAC and we went ahead and applied this. I am not certain whether she is going to be eligible for home health but her staff are certainly going to work on it 02-24-2022 upon evaluation today patient appears to be doing excellent she is using the gauze VAC at this point and it seems to be doing a great job for her. Fortunately there does not appear to be any signs of active infection at this time which is excellent. No fever or chills noted 03-10-2022 upon evaluation today patient appears to be doing well with regard to her wound although the wound was definitely over packed compared to what it needs to  be. I think they were probably got to switch to [facility] Blue rope which I think would be much easier to put him than the current black foam which is what home health is actually using we will try to do a gauze VAC but to be honest there is not actually doing that. Fortunately there does not appear to be any signs of active infection locally or systemically at this time which is great news. I think the biggest issue I see is simply that we need to make sure and have the patient have the Hydrofera Blue rope which is again I think a much  better way to go with this and avoid altogether the use of the black foam inside the wound and put a piece on the outside that is appropriate and fine. 03-17-2022 upon evaluation today patient appears to be doing well currently in regard to her wound. The wound VAC does seem to be helping the [facility] seems to be much better and very pleased in this regard. I do not see any signs of active infection locally or systemically at this time which is great news. No fevers, chills, nausea, vomiting, or diarrhea. 03-24-2022 upon evaluation today patient appears to be doing well with regard to her wound. I am seeing signs of improvement which is great news. Fortunately she is not doing any worse unfortunately they did not have the supplies and her wound VAC on until yesterday but otherwise she is doing excellent. 03-31-2022 upon evaluation today patient appears to be doing well currently in regard to her wound. I do feel like the wound VAC is doing well we may be getting close to the point of not utilizing the wound VAC any longer the patient states that she would be happy to get to that point. Fortunately I do not see any evidence of active infection locally or systemically which is great news. 04-07-2022 upon evaluation today patient appears to be doing well currently in regard to the wound on her right breast area. Fortunately there does not appear to be any signs of infection at this time which is great news and overall I am extremely pleased with where we stand today. No fevers, chills, nausea, vomiting, or diarrhea. 04-21-2022 upon evaluation today patient appears to be doing well currently in regard to her wound. She has been tolerating the dressing changes with the [facility] without complication and overall I am extremely pleased with where things stand today. There does not appear to be any evidence of active infection locally or systemically at this time which is great news. No fevers, chills,  nausea, vomiting, or diarrhea. I do believe that she is headed in the right direction here. 05-05-2022 upon evaluation today patient actually appears to be doing excellent in regard to her wound. This is showing signs of great improvement and very pleased with where we stand. I do not see any signs of infection locally or systemically at this time which is great news. No fevers, chills, nausea, vomiting, or diarrhea. 10/18; small open area in the right breast but with 1.7 cm of depth. Surgical wound in the setting of remote radiation greater than 20 years ago. Switch to endoform last week 05-26-2022 upon evaluation today patient appears to be doing well currently in regard to the overall appearance. Fortunately I do not see any signs of active infection locally or systemically at this time which is great news. No fevers, chills, nausea, vomiting, or diarrhea. 06-16-2022 upon evaluation today patient  appears to be doing well currently in regard to her wound. She has been making excellent progress. Fortunately there does not appear to be any signs of infection locally nor systemically at this time which is great news. No fevers, chills, nausea, vomiting, or diarrhea. 07-14-2022 upon evaluation today patient appears to be doing well currently in regard to her wound. She has been tolerating the dressing changes without complication. Fortunately I think she is making good progress this is about the seal on the bottom of the wound but has not completely. 07-27-2022 upon evaluation today patient appears to be doing well with regard to her wound this area is getting very tight as far as the space is concerned is not nearly as often as it used to be. With that being said fortunately I do not see any evidence of infection locally or systemically at this time which is great news. No fevers, chills, nausea, vomiting, or diarrhea. 08-18-2022 upon evaluation today patient's wound is actually showing signs of excellent  improvement. We were considering hyperbarics but I feel like she is very close to resolution at this point therefore we are going to hold off on that for the time being. [name], [name] ([id number]) [id number] Page 6 of 7 Objective Constitutional Well-nourished and well-hydrated in no acute distress. Vitals Time Taken: 12:47 PM, Height: 64 in, Weight: 180 lbs, BMI: 30.9, Temperature: 98.6 F, Pulse: 96 bpm, Respiratory Rate: 16 breaths/min, Blood Pressure: 134/84 mmHg. Respiratory normal breathing without difficulty. Psychiatric this patient is able to make decisions and demonstrates good insight into disease process. Alert and Oriented x 3. pleasant and cooperative. General Notes: Upon inspection patient's wound bed actually showed signs of significant improvement both in the diameter of the wound as well as the depth of the wound. I think that she is doing very well and it is becoming hard even packing the endoform which is a great problem to have here. Integumentary (Hair, Skin) Wound #1 status is Open. Original cause of wound was Surgical Injury. The date acquired was: 08/26/2021. The wound has been in treatment 29 weeks. The wound is located on the Right Breast. The wound measures 0.1cm length x 0.1cm width x 0.5cm depth; 0.008cm^2 area and 0.004cm^3 volume. There is Fat Layer (Subcutaneous Tissue) exposed. There is no tunneling or undermining noted. There is a medium amount of serosanguineous drainage noted. The wound margin is distinct with the outline attached to the wound base. There is large (67-100%) red granulation within the wound bed. There is no necrotic tissue within the wound bed. The periwound skin appearance had no abnormalities noted for texture. The periwound skin appearance had no abnormalities noted for moisture. The periwound skin appearance had no abnormalities noted for color. Periwound temperature was noted as No Abnormality. The periwound  has tenderness on palpation. 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 - w/ Jeri Cos on Wednesday's Other: - Please follow up with your PCP for the pain/swelling of your right armpit. Home Health: [facility] for wound care. WOUND #1: - Breast Wound Laterality: Right Cleanser: Normal Saline (Generic) 1 x Per Day/30 Days Discharge Instructions: Cleanse the wound with  Normal Saline prior to applying a clean dressing using gauze sponges, not tissue or cotton balls. Cleanser: Wound Cleanser (Generic) 1 x Per Day/30 Days Discharge Instructions: Cleanse the wound with wound cleanser prior to applying a clean dressing using gauze sponges, not tissue or cotton balls. Peri-Wound Care: Skin Prep (Generic) 1 x Per Day/30 Days Discharge Instructions: Use skin prep as directed Prim Dressing: Endoform 2x2 in (Generic) 1 x Per Day/30 Days ary Discharge Instructions: Moisten with saline Secondary Dressing: Woven Gauze Sponge, Non-Sterile 4x4 in (Generic) 1 x Per Day/30 Days Discharge Instructions: In clinic Apply over primary dressing as directed. Secondary Dressing: Zetuvit Plus Silicone Border Dressing 4x4 (in/in) (Generic) 1 x Per Day/30 Days Discharge Instructions: Apply silicone border over primary dressing as directed. 1. I am good recommend currently that we have the patient continue to monitor for any signs of infection or worsening. Overall I do believe that we are headed in the right direction and I am  very pleased with where things stand currently. I do not see any signs of active infection which is great news. 2. I am also can recommend that we have the patient continue to utilize the endoform which I think has been doing a good job. Also believe that continuing with the Zetuvit to cover is a good way to go. We will see patient back for reevaluation in 1 week here in the clinic. If anything worsens or changes patient will contact our office for additional recommendations. [name], [name] ([id number]) [id number] Page 7 of 7 Electronic Signature(s) Signed: 08/18/2022 5:12:13 PM By: Worthy Keeler PA-C Entered By: Worthy Keeler on 08/18/2022 17:12:13 -------------------------------------------------------------------------------- SuperBill Details Patient Name: Date of Service: [name], [name] 08/18/2022 Medical Record Number: [id number] Patient Account Number: [id number] Date of Birth/Sex: Treating RN: May 05, 1946 (77 y.o. 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 D68.61 Antiphospholipid syndrome I10 Essential (primary) hypertension I25.10 Atherosclerotic heart disease of native coronary artery without angina pectoris Facility Procedures : CPT4 Code: 23300762 Description: 99213 - WOUND CARE VISIT-LEV 3 EST PT Modifier: Quantity: 1 Physician Procedures : CPT4 Code Description Modifier [id number] - WC PHYS LEVEL 3 - EST PT ICD-10 Diagnosis  Description T81.31XA Disruption of external operation (surgical) wound, not elsewhere classified, initial encounter L98.492 Non-pressure chronic ulcer of skin of  other sites with fat layer exposed E83.59 Other disorders of calcium metabolism L59.8 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

## 2022-08-19 NOTE — Progress Notes (Signed)
SHYLO, ZAMOR ([id number]) [id number] Page 1 of 7 Visit Report for 08/18/2022 Arrival Information Details Patient Name: Date of Service: [name], [name] 08/18/2022 12:30 PM Medical Record Number: [id number] Patient Account Number: [id number] Date of Birth/Sex: Treating RN: [birth year] (77 y.o. [name], Meta.Reding Primary Care Ranelle Auker: [name] Other Clinician: Referring Kumiko Fishman: Treating Alexarae Oliva/Extender: [name] in Treatment: 29 Visit Information History Since Last Visit Added or deleted any medications: No Patient Arrived: Ambulatory Any new allergies or adverse reactions: No Arrival Time: 12:45 Had a fall or experienced change in No Accompanied By: self activities of daily living that may affect Transfer Assistance: None risk of falls: Patient Identification Verified: Yes Signs or symptoms of abuse/neglect since last visito No Secondary Verification Process Completed: Yes Hospitalized since last visit: No Patient Requires Transmission-Based Precautions: No Implantable device outside of the clinic excluding No Patient Has Alerts: Yes cellular tissue based products placed in the center Patient Alerts: Patient on Blood Thinner since last visit: Has Dressing in Place as Prescribed: Yes Pain Present Now: No Notes Goes Friday to see surgeon who performed her breast surgery. Electronic Signature(s) Signed: 08/18/2022 6:16:47 PM By: Deon Pilling RN, BSN Entered By: Deon Pilling on 08/18/2022 12:47:15 -------------------------------------------------------------------------------- Clinic Level of Care Assessment Details Patient Name: Date of Service: [name], [name] 08/18/2022 12:30 PM Medical Record Number: [id number] Patient Account Number: [id number] Date of Birth/Sex: Treating RN: 1946/03/24 (77 y.o. [name], [name] Primary Care Khyli Swaim: [name] Other Clinician: Referring Lillieanna Tuohy: Treating  Glayds Insco/Extender: [name] in Treatment: 29 Clinic Level of Care Assessment Items TOOL 4 Quantity Score X- 1 0 Use when only an EandM is performed on FOLLOW-UP visit ASSESSMENTS - Nursing Assessment / Reassessment X- 1 10 Reassessment of Co-morbidities (includes updates in patient status) X- 1 5 Reassessment of Adherence to Treatment Plan ASSESSMENTS - Wound and Skin A ssessment / Reassessment X - Simple Wound Assessment / Reassessment - one wound 1 5 '[]'$  - 0 Complex Wound Assessment / Reassessment - multiple wounds '[]'$  - 0 Dermatologic / Skin Assessment (not related to wound area) ASSESSMENTS - Focused Assessment '[]'$  - 0 Circumferential Edema Measurements - multi extremities Krog, Sparrow P ([id number]) [id number] Page 2 of 7 '[]'$  - 0 Nutritional Assessment / Counseling / Intervention '[]'$  - 0 Lower Extremity Assessment (monofilament, tuning fork, pulses) '[]'$  - 0 Peripheral Arterial Disease Assessment (using hand held doppler) ASSESSMENTS - Ostomy and/or Continence Assessment and Care '[]'$  - 0 Incontinence Assessment and Management '[]'$  - 0 Ostomy Care Assessment and Management (repouching, etc.) PROCESS - Coordination of Care X - Simple Patient / Family Education for ongoing care 1 15 '[]'$  - 0 Complex (extensive) Patient / Family Education for ongoing care X- 1 10 Staff obtains Programmer, systems, Records, T Results / Process Orders est '[]'$  - 0 Staff telephones HHA, Nursing Homes / Clarify orders / etc '[]'$  - 0 Routine Transfer to another Facility (non-emergent condition) '[]'$  - 0 Routine Hospital Admission (non-emergent condition) '[]'$  - 0 New Admissions / Biomedical engineer / Ordering NPWT Apligraf, etc. , '[]'$  - 0 Emergency Hospital Admission (emergent condition) X- 1 10 Simple Discharge Coordination '[]'$  - 0 Complex (extensive) Discharge Coordination PROCESS - Special Needs '[]'$  - 0 Pediatric / Minor Patient Management '[]'$  -  0 Isolation Patient Management '[]'$  - 0 Hearing / Language / Visual special needs '[]'$  - 0 Assessment of Community assistance (transportation, D/C planning, etc.) '[]'$  - 0 Additional assistance / Altered mentation '[]'$  - 0 Support  Surface(s) Assessment (bed, cushion, seat, etc.) INTERVENTIONS - Wound Cleansing / Measurement X - Simple Wound Cleansing - one wound 1 5 '[]'$  - 0 Complex Wound Cleansing - multiple wounds X- 1 5 Wound Imaging (photographs - any number of wounds) '[]'$  - 0 Wound Tracing (instead of photographs) X- 1 5 Simple Wound Measurement - one wound '[]'$  - 0 Complex Wound Measurement - multiple wounds INTERVENTIONS - Wound Dressings X - Small Wound Dressing one or multiple wounds 1 10 '[]'$  - 0 Medium Wound Dressing one or multiple wounds '[]'$  - 0 Large Wound Dressing one or multiple wounds X- 1 5 Application of Medications - topical '[]'$  - 0 Application of Medications - injection INTERVENTIONS - Miscellaneous '[]'$  - 0 External ear exam '[]'$  - 0 Specimen Collection (cultures, biopsies, blood, body fluids, etc.) '[]'$  - 0 Specimen(s) / Culture(s) sent or taken to Lab for analysis '[]'$  - 0 Patient Transfer (multiple staff / Civil Service fast streamer / Similar devices) '[]'$  - 0 Simple Staple / Suture removal (25 or less) '[]'$  - 0 Complex Staple / Suture removal (26 or more) Colla, Ourania P ([id number]) [id number] Page 3 of 7 '[]'$  - 0 Hypo / Hyperglycemic Management (close monitor of Blood Glucose) '[]'$  - 0 Ankle / Brachial Index (ABI) - do not check if billed separately X- 1 5 Vital Signs Has the patient been seen at the hospital within the last three years: Yes Total Score: 90 Level Of Care: New/Established - Level 3 Electronic Signature(s) Signed: 08/18/2022 4:09:44 PM By: Rhae Hammock RN Entered By: Rhae Hammock on 08/18/2022 13:04:36 -------------------------------------------------------------------------------- Encounter Discharge Information  Details Patient Name: Date of Service: [name] Oiler. 08/18/2022 12:30 PM Medical Record Number: [id number] Patient Account Number: [id number] Date of Birth/Sex: Treating RN: [birth year] (77 y.o. [name], [name] Primary Care Simuel Stebner: [name] Other Clinician: Referring Nolan Lasser: Treating Bobbijo Holst/Extender: [name] in Treatment: 29 Encounter Discharge Information Items Discharge Condition: Stable Ambulatory Status: Ambulatory Discharge Destination: Home Transportation: Private Auto Accompanied By: self Schedule Follow-up Appointment: Yes Clinical Summary of Care: Patient Declined Electronic Signature(s) Signed: 08/18/2022 4:09:44 PM By: Rhae Hammock RN Entered By: Rhae Hammock on 08/18/2022 13:05:12 -------------------------------------------------------------------------------- Lower Extremity Assessment Details Patient Name: Date of Service: [name], [name] 08/18/2022 12:30 PM Medical Record Number: [id number] Patient Account Number: [id number] Date of Birth/Sex: Treating RN: 11/18/1945 (77 y.o. [name] Primary Care Paton Crum: [name] Other Clinician: Referring Dyan Creelman: Treating Tianna Baus/Extender: [name] in Treatment: 29 Electronic Signature(s) Signed: 08/18/2022 6:16:47 PM By: Deon Pilling RN, BSN Entered By: Deon Pilling on 08/18/2022 12:47:33 Evetts, Lelon Frohlich ([id number]) [id number] Page 4 of 7 -------------------------------------------------------------------------------- Multi-Disciplinary Care Plan Details Patient Name: Date of Service: [name], [name] 08/18/2022 12:30 PM Medical Record Number: [id number] Patient Account Number: [id number] Date of Birth/Sex: Treating RN: 21-Sep-1945 (77 y.o. [name] Primary Care Brian Kocourek: [name] Other Clinician: Referring Cylus Douville: Treating Shigeko Manard/Extender: [name] in Treatment: 29 Active Inactive Wound/Skin Impairment Nursing Diagnoses: Impaired tissue integrity Knowledge deficit related to ulceration/compromised skin integrity Goals: Patient will have a decrease in wound volume by X% from date: (specify in notes) Date Initiated: 01/27/2022 Target Resolution Date: 09/16/2022 Goal Status: Active Patient/caregiver will verbalize understanding of skin care regimen Date Initiated: 01/27/2022 Date Inactivated: 05/05/2022 Target Resolution Date: 04/23/2022 Goal Status: Met Ulcer/skin breakdown will have a volume reduction of 30% by week 4 Date Initiated: 01/27/2022 Date Inactivated: 08/04/2022 Target Resolution Date: 07/19/2022 Unmet Reason: see wound Goal Status:  Unmet measurement. Interventions: Assess patient/caregiver ability to obtain necessary supplies Assess patient/caregiver ability to perform ulcer/skin care regimen upon admission and as needed Assess ulceration(s) every visit Notes: Electronic Signature(s) Signed: 08/18/2022 6:16:47 PM By: Deon Pilling RN, BSN Entered By: Deon Pilling on 08/18/2022 12:52:58 -------------------------------------------------------------------------------- Pain Assessment Details Patient Name: Date of Service: [name] Oiler 08/18/2022 12:30 PM Medical Record Number: [id number] Patient Account Number: [id number] Date of Birth/Sex: Treating RN: 07/28/1945 (77 y.o. [name] Primary Care Tyniya Kuyper: [name] Other Clinician: Referring Faolan Springfield: Treating Dilan Fullenwider/Extender: [name] in Treatment: 29 Active Problems Location of Pain Severity and Description of Pain Patient Has Paino Yes Site Locations Pain Location: YSELA, HETTINGER ([id number]) [phone] Page 5 of 7 Pain Location: Pain in Ulcers Rate the pain. Current Pain Level: 3 Pain Management and Medication Current Pain Management: Medication: No Cold  Application: No Rest: No Massage: No Activity: No T.E.N.S.: No Heat Application: No Leg drop or elevation: No Is the Current Pain Management Adequate: Adequate How does your wound impact your activities of daily livingo Sleep: No Bathing: No Appetite: No Relationship With Others: No Bladder Continence: No Emotions: No Bowel Continence: No Work: No Toileting: No Drive: No Dressing: No Hobbies: No Engineer, maintenance) Signed: 08/18/2022 6:16:47 PM By: Deon Pilling RN, BSN Entered By: Deon Pilling on 08/18/2022 12:47:29 -------------------------------------------------------------------------------- Patient/Caregiver Education Details Patient Name: Date of Service: [name] Oiler 1/24/2024andnbsp12:30 PM Medical Record Number: [id number] Patient Account Number: [id number] Date of Birth/Gender: Treating RN: 01-10-1946 (77 y.o. [name] Primary Care Physician: [name] Other Clinician: Referring Physician: Treating Physician/Extender: [name] in Treatment: 29 Education Assessment Education Provided To: Patient Education Topics Provided Wound/Skin Impairment: Handouts: Caring for Your Ulcer Methods: Explain/Verbal Responses: Reinforcements needed Electronic Signature(s) Signed: 08/18/2022 6:16:47 PM By: Deon Pilling RN, BSN Entered By: Deon Pilling on 08/18/2022 12:53:09 Labrada, Lelon Frohlich ([id number]) [id number] Page 6 of 7 -------------------------------------------------------------------------------- Wound Assessment Details Patient Name: Date of Service: [name], [name] 08/18/2022 12:30 PM Medical Record Number: [id number] Patient Account Number: [id number] Date of Birth/Sex: Treating RN: [birth year] (77 y.o. [name] Primary Care Daziyah Cogan: [name] Other Clinician: Referring Shyleigh Daughtry: Treating Alexandria Shiflett/Extender: Renae Fickle Weeks in Treatment:  29 Wound Status Wound Number: 1 Primary Open Surgical Wound Etiology: Wound Location: Right Breast Secondary Calciphylaxis Wounding Event: Surgical Injury Etiology: Date Acquired: 08/26/2021 Wound Open Weeks Of Treatment: 29 Status: Clustered Wound: No Comorbid Asthma, Deep Vein Thrombosis, Hypertension, Peripheral Arterial History: Disease, Hepatitis B, Osteoarthritis, Neuropathy, Received Radiation Photos Wound Measurements Length: (cm) 0 Width: (cm) 0 Depth: (cm) 0 Area: (cm) Volume: (cm) .1 % Reduction in Area: 88.7% .1 % Reduction in Volume: 94.4% .5 Epithelialization: Large (67-100%) 0.008 Tunneling: No 0.004 Undermining: No Wound Description Classification: Full Thickness With Exposed Suppo Wound Margin: Distinct, outline attached Exudate Amount: Medium Exudate Type: Serosanguineous Exudate Color: red, brown rt Structures Foul Odor After Cleansing: No Slough/Fibrino No Wound Bed Granulation Amount: Large (67-100%) Exposed Structure Granulation Quality: Red Fascia Exposed: No Necrotic Amount: None Present (0%) Fat Layer (Subcutaneous Tissue) Exposed: Yes Tendon Exposed: No Muscle Exposed: No Joint Exposed: No Bone Exposed: No Periwound Skin Texture Texture Color No Abnormalities Noted: Yes No Abnormalities Noted: Yes Moisture Temperature / Pain No Abnormalities Noted: Yes Temperature: No Abnormality Tenderness on Palpation: Yes Treatment Notes Wound #1 (Breast) Wound Laterality: Right Ambroise, Temima P ([id number]) [id number] Page 7 of 7 Cleanser Normal Saline Discharge Instruction: Cleanse the wound with Normal Saline  prior to applying a clean dressing using gauze sponges, not tissue or cotton balls. Wound Cleanser Discharge Instruction: Cleanse the wound with wound cleanser prior to applying a clean dressing using gauze sponges, not tissue or cotton balls. Peri-Wound Care Skin Prep Discharge Instruction: Use skin prep  as directed Topical Primary Dressing Endoform 2x2 in Discharge Instruction: Moisten with saline Secondary Dressing Woven Gauze Sponge, Non-Sterile 4x4 in Discharge Instruction: In clinic Apply over primary dressing as directed. Zetuvit Plus Silicone Border Dressing 4x4 (in/in) Discharge Instruction: Apply silicone border over primary dressing as directed. Secured With Compression Wrap Compression Stockings Environmental education officer) Signed: 08/18/2022 6:16:47 PM By: Deon Pilling RN, BSN Entered By: Deon Pilling on 08/18/2022 12:52:00 -------------------------------------------------------------------------------- Vitals Details Patient Name: Date of Service: [name] Oiler. 08/18/2022 12:30 PM Medical Record Number: [id number] Patient Account Number: [id number] Date of Birth/Sex: Treating RN: [birth year] (77 y.o. [name], Tammi Klippel Primary Care Tyja Gortney: [name] Other Clinician: Referring Kian Gamarra: Treating Mariela Rex/Extender: [name] in Treatment: 29 Vital Signs Time Taken: 12:47 Temperature (F): 98.6 Height (in): 64 Pulse (bpm): 96 Weight (lbs): 180 Respiratory Rate (breaths/min): 16 Body Mass Index (BMI): 30.9 Blood Pressure (mmHg): 134/84 Reference Range: 80 - 120 mg / dl Electronic Signature(s) Signed: 08/18/2022 6:16:47 PM By: Deon Pilling RN, BSN Entered By: Deon Pilling on 08/18/2022 12:47:51

## 2022-08-20 ENCOUNTER — Ambulatory Visit
Admission: RE | Admit: 2022-08-20 | Discharge: 2022-08-20 | Disposition: A | Discharge status: Home / Self Care | Payer: Medicare HMO | Source: Ambulatory Visit | Attending: Family Medicine | Admitting: Family Medicine

## 2022-08-20 DIAGNOSIS — E2839 Other primary ovarian failure: Secondary | ICD-10-CM

## 2022-08-26 ENCOUNTER — Ambulatory Visit
Admission: RE | Admit: 2022-08-26 | Discharge: 2022-08-26 | Disposition: A | Discharge status: Home / Self Care | Payer: Medicare HMO | Source: Ambulatory Visit | Attending: Family Medicine | Admitting: Family Medicine

## 2022-08-30 ENCOUNTER — Inpatient Hospital Stay: Payer: Medicare HMO

## 2022-08-30 ENCOUNTER — Other Ambulatory Visit: Payer: Medicare HMO

## 2022-09-01 ENCOUNTER — Ambulatory Visit (HOSPITAL_BASED_OUTPATIENT_CLINIC_OR_DEPARTMENT_OTHER): Payer: Medicare HMO | Admitting: Internal Medicine

## 2022-09-06 ENCOUNTER — Other Ambulatory Visit: Payer: Self-pay

## 2022-09-07 ENCOUNTER — Other Ambulatory Visit: Payer: Self-pay | Admitting: Surgery

## 2022-09-07 ENCOUNTER — Telehealth: Payer: Self-pay | Admitting: Cardiology

## 2022-09-07 DIAGNOSIS — R2231 Localized swelling, mass and lump, right upper limb: Secondary | ICD-10-CM

## 2022-09-07 MED ORDER — REPATHA PUSHTRONEX SYSTEM 420 MG/3.5ML ~~LOC~~ SOCT: 420.0000 mg | SUBCUTANEOUS | 3 refills | Status: DC

## 2022-09-07 MED ORDER — METOPROLOL TARTRATE 25 MG PO TABS: 12.5000 mg | ORAL_TABLET | Freq: Two times a day (BID) | ORAL | 1 refills | Status: DC

## 2022-09-07 NOTE — Telephone Encounter (Signed)
*  STAT* If patient is at the pharmacy, call can be transferred to refill team.   1. Which medications need to be refilled? (please list name of each medication and dose if known) metoprolol tartrate (LOPRESSOR) 25 MG tablet    2. Which pharmacy/location (including street and city if local pharmacy) is medication to be sent to? [id number]- [place], [place] - [address]   3. Do they need a 30 day or 90 day supply? 90 day  Patient only has two days left.

## 2022-09-08 ENCOUNTER — Ambulatory Visit: Payer: Medicare HMO | Admitting: Family

## 2022-09-08 ENCOUNTER — Encounter (HOSPITAL_BASED_OUTPATIENT_CLINIC_OR_DEPARTMENT_OTHER): Payer: Medicare HMO | Attending: Physician Assistant | Admitting: Physician Assistant

## 2022-09-08 ENCOUNTER — Ambulatory Visit (INDEPENDENT_AMBULATORY_CARE_PROVIDER_SITE_OTHER): Payer: Medicare HMO

## 2022-09-08 ENCOUNTER — Encounter: Payer: Self-pay | Admitting: Family

## 2022-09-08 DIAGNOSIS — L98492 Non-pressure chronic ulcer of skin of other sites with fat layer exposed: Secondary | ICD-10-CM | POA: Diagnosis present

## 2022-09-08 DIAGNOSIS — G629 Polyneuropathy, unspecified: Secondary | ICD-10-CM | POA: Diagnosis not present

## 2022-09-08 DIAGNOSIS — I10 Essential (primary) hypertension: Secondary | ICD-10-CM | POA: Insufficient documentation

## 2022-09-08 DIAGNOSIS — L598 Other specified disorders of the skin and subcutaneous tissue related to radiation: Secondary | ICD-10-CM | POA: Diagnosis not present

## 2022-09-08 DIAGNOSIS — M25562 Pain in left knee: Secondary | ICD-10-CM

## 2022-09-08 DIAGNOSIS — X58XXXA Exposure to other specified factors, initial encounter: Secondary | ICD-10-CM | POA: Diagnosis not present

## 2022-09-08 DIAGNOSIS — T8131XA Disruption of external operation (surgical) wound, not elsewhere classified, initial encounter: Secondary | ICD-10-CM | POA: Diagnosis not present

## 2022-09-08 DIAGNOSIS — G8929 Other chronic pain: Secondary | ICD-10-CM

## 2022-09-08 DIAGNOSIS — I2510 Atherosclerotic heart disease of native coronary artery without angina pectoris: Secondary | ICD-10-CM | POA: Diagnosis not present

## 2022-09-08 DIAGNOSIS — M1990 Unspecified osteoarthritis, unspecified site: Secondary | ICD-10-CM | POA: Insufficient documentation

## 2022-09-08 DIAGNOSIS — D6861 Antiphospholipid syndrome: Secondary | ICD-10-CM | POA: Diagnosis not present

## 2022-09-08 MED ORDER — LIDOCAINE HCL 1 % IJ SOLN
5.0000 mL | INTRAMUSCULAR | Status: AC | PRN
  Administered 2022-09-08: 5 mL

## 2022-09-08 MED ORDER — METHYLPREDNISOLONE ACETATE 40 MG/ML IJ SUSP
40.0000 mg | INTRAMUSCULAR | Status: AC | PRN
  Administered 2022-09-08: 40 mg via INTRA_ARTICULAR

## 2022-09-08 NOTE — Progress Notes (Signed)
Office Visit Note   Patient: [name]           Date of Birth: 04/06/1946           MRN: [id number] Visit Date: 09/08/2022              Requested by: [name]., PA-C [address],  [place] [address] PCP: [name]., PA-C  Chief Complaint  Patient presents with   Left Knee - Pain      HPI: The patient is a 77 year old woman who presents today complaining of left knee pain this has been ongoing for a few months gradually worsening she complains of diffuse aching throbbing pain worse with weightbearing she thinks she may have pivoted "wrong"  No mechanical symptoms Thinks that she has had a Depo-Medrol injection of the same knee over a year ago which provided relief  Assessment & Plan: Visit Diagnoses:  1. Chronic pain of left knee     Plan: Depo-Medrol injection left knee.  Follow-up in the office in 3 to 4 weeks as needed.  Follow-Up Instructions: No follow-ups on file.   Left Knee Exam   Tenderness  The patient is experiencing tenderness in the medial joint line and lateral joint line.  Range of Motion  The patient has normal left knee ROM.  Tests  Varus: negative Valgus: negative  Other  Swelling: mild Effusion: effusion present      Patient is alert, oriented, no adenopathy, well-dressed, normal affect, normal respiratory effort.   Imaging: No results found. No images are attached to the encounter.  Labs: Lab Results  Component Value Date   HGBA1C 6.3 (H) 06/28/2017   HGBA1C 6.0 (H) 04/14/2016   ESRSEDRATE 5 06/28/2017   ESRSEDRATE 11 04/18/2016   ESRSEDRATE 11 11/04/2009   CRP 3.6 06/28/2017   CRP 1.3 (H) 04/18/2016   REPTSTATUS 04/20/2016 FINAL 04/16/2016   GRAMSTAIN  04/16/2016    CYTOSPIN SMEAR WBC PRESENT, PREDOMINANTLY MONONUCLEAR NO ORGANISMS SEEN    CULT NO GROWTH 3 DAYS 04/16/2016     Lab Results  Component Value Date   ALBUMIN 3.8 09/14/2021   ALBUMIN 4.3 04/20/2021   ALBUMIN 4.7  03/25/2021    Lab Results  Component Value Date   MG 2.5 (H) 01/11/2018   MG 2.2 10/07/2016   MG 2.2 04/15/2016   Lab Results  Component Value Date   VD25OH 41.5 06/28/2017    No results found for: "PREALBUMIN"    Latest Ref Rng & Units 05/11/2022   12:51 PM 02/16/2022    2:57 PM 09/14/2021    9:45 AM  CBC EXTENDED  WBC 4.0 - 10.5 K/uL 9.1  13.1  7.3   RBC 3.87 - 5.11 MIL/uL 4.14  3.90  4.11   Hemoglobin 12.0 - 15.0 g/dL 12.6  11.5  12.0   HCT 36.0 - 46.0 % 39.3  35.5  37.2   Platelets 150 - 400 K/uL 266  268  211   NEUT# 1.7 - 7.7 K/uL  11.1  3.9   Lymph# 0.7 - 4.0 K/uL  1.7  2.3      There is no height or weight on file to calculate BMI.  Orders:  Orders Placed This Encounter  Procedures   XR Knee 1-2 Views Left   No orders of the defined types were placed in this encounter.    Procedures: Large Joint Inj: L knee on 09/08/2022 8:38 AM Indications: pain Details: 18 G 1.5 in needle,  anteromedial approach Medications: 5 mL lidocaine 1 %; 40 mg methylPREDNISolone acetate 40 MG/ML Consent was given by the patient.      Clinical Data: No additional findings.  ROS:  All other systems negative, except as noted in the HPI. Review of Systems  Objective: Vital Signs: There were no vitals taken for this visit.  Specialty Comments:  No specialty comments available.  PMFS History: Patient Active Problem List   Diagnosis Date Noted   FH: colon cancer 11/17/2021   Creatinine elevation 07/01/2021   Acute urinary tract infection 07/01/2021   Allergic to bugs 07/01/2021   Antiphospholipid antibody positive 07/01/2021   Anxiety 07/01/2021   Bug bite with infection 07/01/2021   Cellulitis 07/01/2021   Cold sore 07/01/2021   Costal chondritis 07/01/2021   Cramps of lower extremity 07/01/2021   Difficult or painful urination 07/01/2021   Encounter for general adult medical examination without abnormal findings 07/01/2021   Family history of malignant neoplasm  of gastrointestinal tract 07/01/2021   Hematochezia 07/01/2021   Abnormal findings on diagnostic imaging of other parts of digestive tract 07/01/2021   Need for vaccination 07/01/2021   Pain in rib 07/01/2021   Personal history of colonic polyps 07/01/2021   Restless leg 07/01/2021   Right lower quadrant pain 07/01/2021   Tick bite 07/01/2021   Weight loss 07/01/2021   Carotid stenosis, symptomatic w/o infarct, right 04/23/2021   Diaphragmatic hernia 01/08/2021   Flatulence, eructation and gas pain 01/08/2021   History of breast cancer 07/09/2020   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   Impacted cerumen of right ear 04/07/2020   Lump in lower outer quadrant of right breast 02/08/2019   Diarrhea 06/29/2018   Nausea and vomiting 06/29/2018   Elevated blood sugar 06/20/2018   Recurrent major depressive disorder, in partial remission ([place]) 06/20/2018   Bilateral primary osteoarthritis of knee 12/27/2017   Acute infection of nasal sinus 07/21/2017   Prediabetes 06/29/2017   Soft tissue mass 06/14/2017   Displaced fracture of fifth metatarsal bone of right foot 02/07/2017   Unilateral primary osteoarthritis, left knee 11/26/2016   Unilateral primary osteoarthritis, right knee 11/26/2016   Left leg pain    Abnormal MRI of head    Vision changes 04/13/2016   Orthostatic hypotension 03/10/2016   Vertigo 03/10/2016   Depression 11/25/2015   Essential (primary) hypertension 11/25/2015   Hyperlipidemia 11/25/2015   Temporary cerebral vascular dysfunction 11/25/2015   Primary malignant neoplasm ([place]) 11/25/2015   Sleep apnea 11/25/2015   Chronic anticoagulation 08/04/2015   History of anticoagulant therapy 08/04/2015   Pain in limb 12/26/2013   Chest pain 11/27/2013   GERD - post failed open Nissen 11/23/2012   Dysphagia, unspecified(787.20) 09/28/2012   Dysphagia 09/28/2012   S/P Open Nissen 1998 Dr. 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    Panic disorder (episodic paroxysmal anxiety) 12/07/2011   Antiphospholipid antibody syndrome ([place]) 06/01/2011   Lupus anticoagulant positive 06/01/2011   Chest pain    Hx-TIA (transient ischemic attack)    Hypothyroidism    Breast cancer ([place])    History of DVT (deep vein thrombosis)    History of antiphospholipid antibody syndrome    Past Medical History:  Diagnosis Date   Anxiety    severe panic attacks   Arthritis    Aseptic meningitis    Asthma    as child   Breast cancer ([place])    right   Chronic anticoagulation 08/04/2015   Collapsed lung  hx of, left; following left thoracentesis for post-operative pleural effusion > 20 years ago   Complication of anesthesia    "hard to put asleep"; awareness under anesthesia > 35 years ago for hysterectomy   Depression    Fibromyalgia    GERD (gastroesophageal reflux disease)    H/O hiatal hernia    Headache(784.0)    Hepatitis 1990   "from eating at restaurant"   History of antiphospholipid antibody syndrome    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   Hx-TIA (transient ischemic attack)    Hyperlipidemia    Hypertension    Hypothyroidism    Neuromuscular disorder ([place])    Personal history of radiation therapy    Pneumonia    hx of   Soft tissue mass 06/14/2017   3 cm right post axilla same side as previous breast cancer 06/14/17   Stroke ([place])    Thrombosis, upper extremity artery ([facility]) 04/17/2012   Left digital artery  October 1998 - new dx antiphospholipid antibody syndrome    Family History  Problem Relation Age of Onset   Alzheimer's disease Mother    Heart disease Mother    Hyperlipidemia Mother    Hypertension Mother    Colon cancer Sister    Heart disease Sister    Hypertension Sister    Heart attack Sister    Colon polyps Brother    Heart disease Brother    Heart attack Brother    Colon cancer Maternal Grandmother    Cancer Maternal Grandmother        colon   Cancer Daughter     Hypertension Daughter    Hypertension Son     Past Surgical History:  Procedure Laterality Date   ABDOMINAL HYSTERECTOMY     BREAST LUMPECTOMY Right 2000   BREAST LUMPECTOMY Right 09/15/2021   Procedure: RIGHT BREAST LUMPECTOMY;  Surgeon: Erroll Luna, MD;  Location: [facility];  Service: General;  Laterality: Right;   BREAST SURGERY Right    x3   BUNIONECTOMY Bilateral 30 years ago   CARDIAC CATHETERIZATION  07/12/1997   NORMAL LEFT VENTRICULAR FUNCTION WITH EF AT LEAST 70-75%   ENDARTERECTOMY Right 04/23/2021   Procedure: RIGHT CAROTID ENDARTERECTOMY;  Surgeon: Waynetta Sandy, MD;  Location: [facility];  Service: Vascular;  Laterality: Right;   ESOPHAGEAL MANOMETRY N/A 11/06/2012   Procedure: ESOPHAGEAL MANOMETRY (EM);  Surgeon: Pedro Earls, MD;  Location: [facility];  Service: General;  Laterality: N/A;   ESOPHAGOGASTRODUODENOSCOPY ENDOSCOPY     KNEE ARTHROSCOPY Right    LAPAROSCOPIC NISSEN FUNDOPLICATION N/A [id number]   Procedure: LAPAROSCOPIC TAKEDOWN  PERI-HIATAL HERNIA    REPAIR;  Surgeon: Pedro Earls, MD;  Location: [facility];  Service: General;  Laterality: N/A;   [place] ANGIOPLASTY Right 04/23/2021   Procedure: PATCH ANGIOPLASTY;  Surgeon: Waynetta Sandy, MD;  Location[facility];  Service: Vascular;  Laterality: Right;   RIGHT OOPHORECTOMY  1980   THYROIDECTOMY  in 20's   TONSILLECTOMY  77 years old   UPPER GI ENDOSCOPY N/A 12/12/2012   Procedure: UPPER GI ENDOSCOPY;  Surgeon: Pedro Earls, MD;  Location: [facility];  Service: General;  Laterality: N/A;   Social History   Occupational History   Not on file  Tobacco Use   Smoking status: Never    Passive exposure: Past   Smokeless tobacco: Never  Vaping Use   Vaping Use: Never used  Substance  and Sexual Activity   Alcohol use: No    Alcohol/week: 0.0 standard drinks of alcohol   Drug use: No   Sexual activity: Never

## 2022-09-08 NOTE — Progress Notes (Addendum)
[name], [name] ([id number]) [phone].pdf Page 1 of 7 Visit Report for 09/08/2022 Chief Complaint Document Details Patient Name: Date of Service: [name], [name] 09/08/2022 10:30 A M Medical Record Number: [id number] Patient Account Number: [id number] Date of Birth/Sex: Treating RN: 12-03-1945 (77 y.o. F) Primary Care Provider: [name] Other Clinician: Referring Provider: Treating Provider/Extender: [name] in Treatment: 32 Information Obtained from: Patient Chief Complaint Right breast ulcer Electronic Signature(s) Signed: 09/08/2022 10:12:41 AM By: Worthy Keeler PA-C Entered By: Worthy Keeler on 09/08/2022 10:12:41 -------------------------------------------------------------------------------- HPI Details Patient Name: Date of Service: [name]. 09/08/2022 10:30 A M Medical Record Number: [id number] Patient Account Number: [id number] Date of Birth/Sex: Treating RN: [birth year] (77 y.o. F) Primary Care Provider: [name] Other Clinician: Referring Provider: Treating Provider/Extender: [name] in Treatment: 32 History of Present Illness HPI Description: 01-27-2022 upon evaluation today patient presents for initial evaluation here in our clinic concerning issues she has been having with her right breast. She had an area of calcium buildup where she had actual staples that were being pushed out that were noted from her previous breast surgery which was roughly 23 years ago when she had cancer. At that point she had also undergone radiation therapy. 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 Nonetheless after her most recent surgery in February to remove the staples and the calcium deposit this has started to build back up due to the damage to the breast tissue. She was seen initially by Dr. Brantley Stage. Eventually he referred her to Dr.  Rebekah Chesterfield to see about a mastectomy to take care of the issue. With that being said this was something that was good to be reserved just for worst-case scenario in fact they were a little bit concerned about her reaction considering her blood thinners and the fact that she may not do as well and surgery is what they would like to see with all the other medical problems that she has. For that reason they referred her to [name] in order to see what we can do from a healing standpoint. 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 02-03-2022 upon evaluation today patient appears to be doing okay in regard to her wound on the breast. Fortunately I do not see any signs of worsening. Unfortunately I do believe that she is still having some discomfort also there was some misunderstanding about how to do the dressing which I think will make it a little bit easier she should be putting it in before waiting it and then after its in and situated she can wet this. I think that is going to be a lot easier for her than what she has been trying to do. 02-10-2022 upon evaluation today patient appears to be doing about the same in regard to her wound. She has been tolerating the dressing changes without complication. We are actually starting her on linezolid today actually had to apply to get this approved by her insurance I am hopeful that this will be affordable for her now it was going to cost her way too much before. With that being said I do believe that  she is headed in the right direction which is great news. No fevers, chills, nausea, vomiting, or diarrhea. 7/26; right breast apparently secondary to surgery earlier  this year for protruding staples and underlying traumatic calcifications. She has a nonhealing wound with some depth and tunneling. She has been using Hydrofera Blue with some improvement in measurements today. Nevertheless she has a wound VAC and we went ahead and applied this. I am not certain whether she is going to be eligible for home health but her staff are certainly going to work on it 02-24-2022 upon evaluation today patient appears to be doing excellent she is using the gauze VAC at this point and it seems to be doing a great job for her. Fortunately there does not appear to be any signs of active infection at this time which is excellent. No fever or chills noted 03-10-2022 upon evaluation today patient appears to be doing well with regard to her wound although the wound was definitely over packed compared to what it [name], [name] ([id number]) [phone].pdf Page 2 of 7 needs to be. I think they were probably got to switch to [facility] Blue rope which I think would be much easier to put him than the current black foam which is what home health is actually using we will try to do a gauze VAC but to be honest there is not actually doing that. Fortunately there does not appear to be any signs of active infection locally or systemically at this time which is great news. I think the biggest issue I see is simply that we need to make sure and have the patient have the Hydrofera Blue rope which is again I think a much better way to go with this and avoid altogether the use of the black foam inside the wound and put a piece on the outside that is appropriate and fine. 03-17-2022 upon evaluation today patient appears to be doing well currently in regard to her wound. The wound VAC does seem to be helping the [facility] seems to be much better and very pleased in this regard. I do not see any signs of active infection locally or systemically at this time which is great news.  No fevers, chills, nausea, vomiting, or diarrhea. 03-24-2022 upon evaluation today patient appears to be doing well with regard to her wound. I am seeing signs of improvement which is great news. Fortunately she is not doing any worse unfortunately they did not have the supplies and her wound VAC on until yesterday but otherwise she is doing excellent. 03-31-2022 upon evaluation today patient appears to be doing well currently in regard to her wound. I do feel like the wound VAC is doing well we may be getting close to the point of not utilizing the wound VAC any longer the patient states that she would be happy to get to that point. Fortunately I do not see any evidence of active infection locally or systemically which is great news. 04-07-2022 upon evaluation today patient appears to be doing well currently in regard to the wound on her right breast area. Fortunately there does not appear to be any signs of infection at this time which is great news and overall I am extremely pleased with where we stand today. No fevers, chills, nausea, vomiting, or diarrhea. 04-21-2022 upon evaluation today patient appears to be doing well currently in regard to her wound. She has been tolerating the dressing changes with the Hydrofera Blue without complication and overall I  am extremely pleased with where things stand today. There does not appear to be any evidence of active infection locally or systemically at this time which is great news. No fevers, chills, nausea, vomiting, or diarrhea. I do believe that she is headed in the right direction here. 05-05-2022 upon evaluation today patient actually appears to be doing excellent in regard to her wound. This is showing signs of great improvement and very pleased with where we stand. I do not see any signs of infection locally or systemically at this time which is great news. No fevers, chills, nausea, vomiting, or diarrhea. 10/18; small open area in the right breast  but with 1.7 cm of depth. Surgical wound in the setting of remote radiation greater than 20 years ago. Switch to endoform last week 05-26-2022 upon evaluation today patient appears to be doing well currently in regard to the overall appearance. Fortunately I do not see any signs of active infection locally or systemically at this time which is great news. No fevers, chills, nausea, vomiting, or diarrhea. 06-16-2022 upon evaluation today patient appears to be doing well currently in regard to her wound. She has been making excellent progress. Fortunately there does not appear to be any signs of infection locally nor systemically at this time which is great news. No fevers, chills, nausea, vomiting, or diarrhea. 07-14-2022 upon evaluation today patient appears to be doing well currently in regard to her wound. She has been tolerating the dressing changes without complication. Fortunately I think she is making good progress this is about the seal on the bottom of the wound but has not completely. 07-27-2022 upon evaluation today patient appears to be doing well with regard to her wound this area is getting very tight as far as the space is concerned is not nearly as often as it used to be. With that being said fortunately I do not see any evidence of infection locally or systemically at this time which is great news. No fevers, chills, nausea, vomiting, or diarrhea. 08-18-2022 upon evaluation today patient's wound is actually showing signs of excellent improvement. We were considering hyperbarics but I feel like she is very close to resolution at this point therefore we are going to hold off on that for the time being. 09-08-2022 upon evaluation today patient appears to be doing excellent in regard to her wound which in fact appears to be completely healed this is also news. She has been dealing with this for quite some time in fact I think I been seeing her currently for 32 weeks this is a very good day for  her. Electronic Signature(s) Signed: 09/08/2022 2:03:47 PM By: Worthy Keeler PA-C Entered By: Worthy Keeler on 09/08/2022 14:03:47 -------------------------------------------------------------------------------- Physical Exam Details Patient Name: Date of Service: [name], [name] 09/08/2022 10:30 A M Medical Record Number: [id number] Patient Account Number: [id number] Date of Birth/Sex: Treating RN: [birth year] (77 y.o. F) Primary Care Provider: [name] Other Clinician: Referring Provider: Treating Provider/Extender: Renae Fickle Weeks in Treatment: 44 Constitutional Well-nourished and well-hydrated in no acute distress. Respiratory normal breathing without difficulty. Psychiatric this patient is able to make decisions and demonstrates good insight into disease process. Alert and Oriented x 3. pleasant and cooperative. Notes [name], [name] ([id number]) [id number] Page 3 of 7 Upon inspection patient's wound bed actually showed signs of good granulation epithelization at this point. In fact this appears to be completely epithelialized which is great news. I would recommend that we continue as such  with monitoring but I do not think she requires any actual dressings to the wound bed area. Electronic Signature(s) Signed: 09/08/2022 2:04:05 PM By: Worthy Keeler PA-C Entered By: Worthy Keeler on 09/08/2022 14:04:05 -------------------------------------------------------------------------------- Physician Orders Details Patient Name: Date of Service: [name] 09/08/2022 10:30 A M Medical Record Number: [id number] Patient Account Number: [id number] Date of Birth/Sex: Treating RN: August 18, 1945 (77 y.o. [name] Primary Care Provider: [name] Other Clinician: Referring Provider: Treating Provider/Extender: [name] in Treatment: [phone] Verbal / Phone Orders: No Diagnosis  Coding ICD-10 Coding Code Description T81.31XA Disruption of external operation (surgical) wound, not elsewhere classified, initial encounter L98.492 Non-pressure chronic ulcer of skin of other sites with fat layer exposed E83.59 Other disorders of calcium metabolism L59.8 Other specified disorders of the skin and subcutaneous tissue related to radiation D68.61 Antiphospholipid syndrome I10 Essential (primary) hypertension I25.10 Atherosclerotic heart disease of native coronary artery without angina pectoris Discharge From [facility] Services Discharge from [facility] - ******CONGRATULATIONS******* Call wound care center if you need [name]!!! Electronic Signature(s) Signed: 09/08/2022 3:48:00 PM By: Sharyn Creamer RN, BSN Signed: 09/08/2022 5:47:19 PM By: Worthy Keeler PA-C Entered By: Sharyn Creamer on 09/08/2022 10:19:49 -------------------------------------------------------------------------------- Problem List Details Patient Name: Date of Service: [name]. 09/08/2022 10:30 A M Medical Record Number: [id number] Patient Account Number: [id number] Date of Birth/Sex: Treating RN: 1946/05/09 (76 y.o. 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.59 Other disorders of calcium metabolism 01/27/2022 No Yes L59.8 Other specified disorders of the skin and subcutaneous tissue related to 01/27/2022 No Yes radiation D68.61 Antiphospholipid syndrome 01/27/2022 No Yes I10 Essential (primary)  hypertension 01/27/2022 No Yes I25.10 Atherosclerotic heart disease of native coronary artery without angina pectoris 01/27/2022 No Yes Inactive Problems Resolved Problems Electronic Signature(s) Signed: 09/08/2022 10:12:21 AM By: Worthy Keeler PA-C Entered By: Worthy Keeler on 09/08/2022 10:12:21 -------------------------------------------------------------------------------- Progress Note Details Patient Name: Date of Service: [name]. 09/08/2022 10:30 A M Medical Record Number: [id number] Patient Account Number: [id number] Date of Birth/Sex: Treating RN: [birth year] (77 y.o. F) Primary Care Provider: [name] Other Clinician: Referring Provider: Treating Provider/Extender: [name] in Treatment: 25 Subjective Chief Complaint Information obtained from Patient Right breast ulcer History of Present Illness (HPI) 01-27-2022 upon evaluation today patient presents for initial evaluation here in our clinic concerning issues she has been having with her right breast. She had an area of calcium buildup where she had actual staples that were being pushed out that were noted from her previous breast surgery which was roughly 23 years ago when she had cancer. At that point she had also undergone radiation therapy. 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 Nonetheless after her most recent surgery in February to remove the staples and the calcium deposit this has started to build back up due to the damage to the breast tissue. She was seen initially by Dr. Brantley Stage. Eventually he referred her to Dr. Rebekah Chesterfield to see about a mastectomy to take care of the issue. With that being said this  was something that was good to be reserved just for worst-case scenario in fact they were a little bit concerned about her reaction considering her blood thinners and the fact that she may not do as well and surgery is what they would  like to see with all the other medical problems that she has. For that reason they referred her to [name] in order to see what we can do from a healing standpoint. 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 02-03-2022 upon evaluation today patient appears to be doing okay in regard to her wound on the breast. Fortunately I do not see any signs of worsening. Unfortunately I do believe that she is still having some discomfort also there was some misunderstanding about how to do the dressing which I think will make it a little bit easier she should be putting it in before waiting it and then after its in and situated she can wet this. I think that is going to be a lot easier for her than what she has been trying to do. 02-10-2022 upon evaluation today patient appears to be doing about the same in regard to her wound. She has been tolerating the dressing changes without complication. We are actually starting her on linezolid today actually had to apply to get this approved by her insurance I am hopeful that this will be affordable [name], [name] ([id number]) [id number].pdf Page 5 of 7 for her now it was going to cost her way too much before. With that being said I do believe that she is headed in the right direction which is great news. No fevers, chills, nausea, vomiting, or diarrhea. 7/26; right breast apparently secondary to surgery earlier this year for protruding staples and underlying traumatic calcifications. She has a nonhealing wound with some depth and tunneling. She has been using Hydrofera Blue with some improvement in measurements today. Nevertheless she has a wound VAC  and we went ahead and applied this. I am not certain whether she is going to be eligible for home health but her staff are certainly going to work on it 02-24-2022 upon evaluation today patient appears to be doing excellent she is using the gauze VAC at this point and it seems to be doing a great job for her. Fortunately there does not appear to be any signs of active infection at this time which is excellent. No fever or chills noted 03-10-2022 upon evaluation today patient appears to be doing well with regard to her wound although the wound was definitely over packed compared to what it needs to be. I think they were probably got to switch to [facility] Blue rope which I think would be much easier to put him than the current black foam which is what home health is actually using we will try to do a gauze VAC but to be honest there is not actually doing that. Fortunately there does not appear to be any signs of active infection locally or systemically at this time which is great news. I think the biggest issue I see is simply that we need to make sure and have the patient have the Hydrofera Blue rope which is again I think a much better way  to go with this and avoid altogether the use of the black foam inside the wound and put a piece on the outside that is appropriate and fine. 03-17-2022 upon evaluation today patient appears to be doing well currently in regard to her wound. The wound VAC does seem to be helping the [facility] seems to be much better and very pleased in this regard. I do not see any signs of active infection locally or systemically at this time which is great news. No fevers, chills, nausea, vomiting, or diarrhea. 03-24-2022 upon evaluation today patient appears to be doing well with regard to her wound. I am seeing signs of improvement which is great news. Fortunately she is not doing any worse unfortunately they did not have the supplies and her wound VAC on until yesterday but  otherwise she is doing excellent. 03-31-2022 upon evaluation today patient appears to be doing well currently in regard to her wound. I do feel like the wound VAC is doing well we may be getting close to the point of not utilizing the wound VAC any longer the patient states that she would be happy to get to that point. Fortunately I do not see any evidence of active infection locally or systemically which is great news. 04-07-2022 upon evaluation today patient appears to be doing well currently in regard to the wound on her right breast area. Fortunately there does not appear to be any signs of infection at this time which is great news and overall I am extremely pleased with where we stand today. No fevers, chills, nausea, vomiting, or diarrhea. 04-21-2022 upon evaluation today patient appears to be doing well currently in regard to her wound. She has been tolerating the dressing changes with the [facility] without complication and overall I am extremely pleased with where things stand today. There does not appear to be any evidence of active infection locally or systemically at this time which is great news. No fevers, chills, nausea, vomiting, or diarrhea. I do believe that she is headed in the right direction here. 05-05-2022 upon evaluation today patient actually appears to be doing excellent in regard to her wound. This is showing signs of great improvement and very pleased with where we stand. I do not see any signs of infection locally or systemically at this time which is great news. No fevers, chills, nausea, vomiting, or diarrhea. 10/18; small open area in the right breast but with 1.7 cm of depth. Surgical wound in the setting of remote radiation greater than 20 years ago. Switch to endoform last week 05-26-2022 upon evaluation today patient appears to be doing well currently in regard to the overall appearance. Fortunately I do not see any signs of active infection locally or  systemically at this time which is great news. No fevers, chills, nausea, vomiting, or diarrhea. 06-16-2022 upon evaluation today patient appears to be doing well currently in regard to her wound. She has been making excellent progress. Fortunately there does not appear to be any signs of infection locally nor systemically at this time which is great news. No fevers, chills, nausea, vomiting, or diarrhea. 07-14-2022 upon evaluation today patient appears to be doing well currently in regard to her wound. She has been tolerating the dressing changes without complication. Fortunately I think she is making good progress this is about the seal on the bottom of the wound but has not completely. 07-27-2022 upon evaluation today patient appears to be doing well with regard to her wound this  area is getting very tight as far as the space is concerned is not nearly as often as it used to be. With that being said fortunately I do not see any evidence of infection locally or systemically at this time which is great news. No fevers, chills, nausea, vomiting, or diarrhea. 08-18-2022 upon evaluation today patient's wound is actually showing signs of excellent improvement. We were considering hyperbarics but I feel like she is very close to resolution at this point therefore we are going to hold off on that for the time being. 09-08-2022 upon evaluation today patient appears to be doing excellent in regard to her wound which in fact appears to be completely healed this is also news. She has been dealing with this for quite some time in fact I think I been seeing her currently for 32 weeks this is a very good day for her. Objective Constitutional Well-nourished and well-hydrated in no acute distress. Vitals Time Taken: 10:06 AM, Height: 64 in, Weight: 180 lbs, BMI: 30.9, Temperature: 97.8 F, Pulse: 83 bpm, Respiratory Rate: 18 breaths/min, Blood Pressure: 142/87 mmHg. Respiratory normal breathing without  difficulty. Psychiatric this patient is able to make decisions and demonstrates good insight into disease process. Alert and Oriented x 3. pleasant and cooperative. General Notes: Upon inspection patient's wound bed actually showed signs of good granulation epithelization at this point. In fact this appears to be completely epithelialized which is great news. I would recommend that we continue as such with monitoring but I do not think she requires any actual dressings to the wound bed area. [name], [name] ([id number]) [phone].pdf Page 6 of 7 Integumentary (Hair, Skin) Wound #1 status is Healed - Epithelialized. Original cause of wound was Surgical Injury. The date acquired was: 08/26/2021. The wound has been in treatment 32 weeks. The wound is located on the Right Breast. The wound measures 0cm length x 0cm width x 0cm depth; 0cm^2 area and 0cm^3 volume. There is no tunneling or undermining noted. There is a none present amount of drainage noted. The wound margin is distinct with the outline attached to the wound base. There is no granulation within the wound bed. There is no necrotic tissue within the wound bed. The periwound skin appearance had no abnormalities noted for texture. The periwound skin appearance had no abnormalities noted for moisture. The periwound skin appearance had no abnormalities noted for color. Periwound temperature was noted as No Abnormality. The periwound has tenderness on palpation. 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: Discharge from [place] - ******CONGRATULATIONS******* Call wound care center if you need [name]!!! 1. Based on what I am seeing right now would recommend that the patient should continue to monitor for any signs of anything worsening though right now I feel like she is completely healed this is great news. Will plan to see her back just on an as-needed basis at this point. She is going to continue to follow-up with her primary care doctor and OB/GYN regarding the lymph nodes under the right axillary region. Were not exactly sure what this is coming from as of yet. Will see her back for follow-up visit as needed. Electronic  Signature(s) Signed: 09/08/2022 2:05:10 PM By: Worthy Keeler PA-C Entered By: Worthy Keeler on 09/08/2022 14:05:10 -------------------------------------------------------------------------------- SuperBill Details Patient Name: Date of Service: [name] 09/08/2022 Medical Record Number: [id number] Patient Account Number: [id number] Date of Birth/Sex: Treating RN: 1945/08/28 (77 y.o. 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 D68.61 Antiphospholipid syndrome I10 Essential (primary) hypertension I25.10 Atherosclerotic heart disease of native coronary artery without angina pectoris Facility Procedures [name], [name] ([id number]): CPT4 Code Description [id number] [place] VISIT-LEV 2 EST  PT [id number] Page 7 of 7: Modifier Quantity 25 1 Physician Procedures : CPT4 Code Description Modifier [id number] - WC PHYS LEVEL 3 - EST PT ICD-10 Diagnosis Description T81.31XA Disruption of external operation (surgical) wound, not elsewhere classified, initial encounter L98.492 Non-pressure chronic ulcer of skin of  other sites with fat layer exposed E83.59 Other disorders of calcium metabolism L59.8 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

## 2022-09-09 NOTE — Progress Notes (Signed)
[name], [name] ([id number]) [phone].pdf Page 1 of 6 Visit Report for 09/08/2022 Arrival Information Details Patient Name: Date of Service: [name], [name] 09/08/2022 10:30 A M Medical Record Number: [id number] Patient Account Number: [id number] Date of Birth/Sex: Treating RN: 06/27/1946 (77 y.o. [name] Primary Care [name]: [name] Other Clinician: Referring [name]: Treating [name]/Extender: [name] in Treatment: 69 Visit Information History Since Last Visit Added or deleted any medications: No Patient Arrived: Ambulatory Any new allergies or adverse reactions: No Arrival Time: 10:05 Had a fall or experienced change in No Accompanied By: self activities of daily living that may affect Transfer Assistance: None risk of falls: Patient Identification Verified: Yes Signs or symptoms of abuse/neglect since last visito No Secondary Verification Process Completed: Yes Hospitalized since last visit: No Patient Requires Transmission-Based Precautions: No Implantable device outside of the clinic excluding No Patient Has Alerts: Yes cellular tissue based products placed in the center Patient Alerts: Patient on Blood Thinner since last visit: Has Dressing in Place as Prescribed: No Pain Present Now: No Electronic Signature(s) Signed: 09/08/2022 3:48:00 PM By: [name] Creamer RN, BSN Entered By: [name] on 09/08/2022 10:05:57 -------------------------------------------------------------------------------- Clinic Level of Care Assessment Details Patient Name: Date of Service: [name], [name] 09/08/2022 10:30 A M Medical Record Number: [id number] Patient Account Number: [id number] Date of Birth/Sex: Treating RN: 10-22-1945 (77 y.o. [name] Primary Care [name]: [name] Other Clinician: Referring [name]: Treating [name]/Extender: [name] in  Treatment: 32 Clinic Level of Care Assessment Items TOOL 4 Quantity Score X- 1 0 Use when only an EandM is performed on FOLLOW-UP visit ASSESSMENTS - Nursing Assessment / Reassessment X- 1 10 Reassessment of Co-morbidities (includes updates in patient status) X- 1 5 Reassessment of Adherence to Treatment Plan ASSESSMENTS - Wound and Skin A ssessment / Reassessment X - Simple Wound Assessment / Reassessment - one wound 1 5 []$  - 0 Complex Wound Assessment / Reassessment - multiple wounds []$  - 0 Dermatologic / Skin Assessment (not related to wound area) ASSESSMENTS - Focused Assessment []$  - 0 Circumferential Edema Measurements - multi extremities []$  - 0 Nutritional Assessment / Counseling / Intervention KASHAWNA, [name] ([id number]) [id number] Page 2 of 6 []$  - 0 Lower Extremity Assessment (monofilament, tuning fork, pulses) []$  - 0 Peripheral Arterial Disease Assessment (using hand held doppler) ASSESSMENTS - Ostomy and/or Continence Assessment and Care []$  - 0 Incontinence Assessment and Management []$  - 0 Ostomy Care Assessment and Management (repouching, etc.) PROCESS - Coordination of Care X - Simple Patient / Family Education for ongoing care 1 15 []$  - 0 Complex (extensive) Patient / Family Education for ongoing care X- 1 10 Staff obtains Programmer, systems, Records, T Results / Process Orders est []$  - 0 Staff telephones HHA, Nursing Homes / Clarify orders / etc []$  - 0 Routine Transfer to another Facility (non-emergent condition) []$  - 0 Routine Hospital Admission (non-emergent condition) []$  - 0 New Admissions / Biomedical engineer / Ordering NPWT Apligraf, etc. , []$  - 0 Emergency Hospital Admission (emergent condition) X- 1 10 Simple Discharge Coordination []$  - 0 Complex (extensive) Discharge Coordination PROCESS - Special Needs []$  - 0 Pediatric / Minor Patient Management []$  - 0 Isolation Patient Management []$  - 0 Hearing / Language /  Visual special needs []$  - 0 Assessment of Community assistance (transportation, D/C planning, etc.) []$  - 0 Additional assistance / Altered mentation []$  - 0 Support Surface(s) Assessment (bed, cushion, seat, etc.) INTERVENTIONS - Wound  Cleansing / Measurement X - Simple Wound Cleansing - one wound 1 5 []$  - 0 Complex Wound Cleansing - multiple wounds X- 1 5 Wound Imaging (photographs - any number of wounds) []$  - 0 Wound Tracing (instead of photographs) X- 1 5 Simple Wound Measurement - one wound []$  - 0 Complex Wound Measurement - multiple wounds INTERVENTIONS - Wound Dressings []$  - 0 Small Wound Dressing one or multiple wounds []$  - 0 Medium Wound Dressing one or multiple wounds []$  - 0 Large Wound Dressing one or multiple wounds []$  - 0 Application of Medications - topical []$  - 0 Application of Medications - injection INTERVENTIONS - Miscellaneous []$  - 0 External ear exam []$  - 0 Specimen Collection (cultures, biopsies, blood, body fluids, etc.) []$  - 0 Specimen(s) / Culture(s) sent or taken to Lab for analysis []$  - 0 Patient Transfer (multiple staff / Civil Service fast streamer / Similar devices) []$  - 0 Simple Staple / Suture removal (25 or less) []$  - 0 Complex Staple / Suture removal (26 or more) []$  - 0 Hypo / Hyperglycemic Management (close monitor of Blood Glucose) Depinto, Laine P ([id number]) [id number] Page 3 of 6 []$  - 0 Ankle / Brachial Index (ABI) - do not check if billed separately X- 1 5 Vital Signs Has the patient been seen at the hospital within the last three years: Yes Total Score: 75 Level Of Care: New/Established - Level 2 Electronic Signature(s) Signed: 09/08/2022 3:48:00 PM By: [name] Creamer RN, BSN Entered By: [name] on 09/08/2022 14:35:30 -------------------------------------------------------------------------------- Encounter Discharge Information Details Patient Name: Date of Service: [name]. 09/08/2022 10:30 A  M Medical Record Number: [id number] Patient Account Number: [id number] Date of Birth/Sex: Treating RN: May 30, 1946 (77 y.o. [name] Primary Care Inayah Woodin: [name] Other Clinician: Referring Catalino Plascencia: Treating Mindy Gali/Extender: [name] in Treatment: 32 Encounter Discharge Information Items Discharge Condition: Stable Ambulatory Status: Ambulatory Discharge Destination: Home Transportation: Private Auto Accompanied By: self Schedule Follow-up Appointment: Yes Clinical Summary of Care: Patient Declined Electronic Signature(s) Signed: 09/08/2022 3:48:00 PM By: [name] Creamer RN, BSN Entered By: [name] on 09/08/2022 14:37:15 -------------------------------------------------------------------------------- Lower Extremity Assessment Details Patient Name: Date of Service: [name]. 09/08/2022 10:30 A M Medical Record Number: [id number] Patient Account Number: [id number] Date of Birth/Sex: Treating RN: 1945/10/16 (77 y.o. [name] Primary Care Megan Hayduk: [name] Other Clinician: Referring Arlet Marter: Treating Elizabethanne Lusher/Extender: [name] in Treatment: 32 Electronic Signature(s) Signed: 09/08/2022 3:48:00 PM By: [name] Creamer RN, BSN Entered By: [name] on 09/08/2022 10:07:14 -------------------------------------------------------------------------------- Multi-Disciplinary Care Plan Details Patient Name: Date of Service: [name]. 09/08/2022 10:30 A M Medical Record Number: [id number] Patient Account Number: [id number] [name], [name] ([id number]) [phone].pdf Page 4 of 6 Date of Birth/Sex: Treating RN: January 31, 1946 (77 y.o. [name] Primary Care Phuc Kluttz: Other Clinician: Bing [name] Referring Beanca Kiester: Treating Shamela Haydon/Extender: [name] in Treatment: 32 Active Inactive Electronic  Signature(s) Signed: 09/08/2022 3:48:00 PM By: [name] Creamer RN, BSN Entered By: [name] on 09/08/2022 10:17:26 -------------------------------------------------------------------------------- Pain Assessment Details Patient Name: Date of Service: [name], [name] 09/08/2022 10:30 A M Medical Record Number: [id number] Patient Account Number: [id number] Date of Birth/Sex: Treating RN: May 30, 1946 (77 y.o. [name] Primary Care Mikka Kissner: [name] Other Clinician: Referring Jenafer Winterton: Treating Belanna Manring/Extender: [name] in Treatment: 32 Active Problems Location of Pain Severity and Description of Pain Patient Has Paino No Site Locations Pain Management and Medication Current  Pain Management: Electronic Signature(s) Signed: 09/08/2022 3:48:00 PM By: [name] Creamer RN, BSN Entered By: [name] on 09/08/2022 10:07:02 -------------------------------------------------------------------------------- Patient/Caregiver Education Details Patient Name: Date of Service: [name] 2/14/2024andnbsp10:30 A M Medical Record Number: [id number] Patient Account Number: [id number] Date of Birth/Gender: Treating RN: 06/13/1946 (77 y.o. [address], [place] ([id number]) [phone].pdf Page 5 of 6 Primary Care Physician: [name] Other Clinician: Referring Physician: Treating Physician/Extender: [name] in Treatment: 4 Education Assessment Education Provided To: Patient Education Topics Provided Wound/Skin Impairment: Methods: Explain/Verbal Responses: State content correctly [employer]) Signed: 09/08/2022 3:48:00 PM By: [name] Creamer RN, BSN Entered By: [name] on 09/08/2022 10:15:32 -------------------------------------------------------------------------------- Wound Assessment Details Patient Name: Date of Service: [name]. 09/08/2022 10:30 A M Medical Record Number: [id number] Patient Account Number: [id number] Date of Birth/Sex: Treating RN: Mar 19, 1946 (77 y.o. [name] Primary Care Lajoya Dombek: [name] Other Clinician: Referring Salia Cangemi: Treating Najai Waszak/Extender: [name] in Treatment: 32 Wound Status Wound Number: 1 Primary Open Surgical Wound Etiology: Wound Location: Right Breast Secondary Calciphylaxis Wounding Event: Surgical Injury Etiology: Date Acquired: 08/26/2021 Wound Healed - Epithelialized Weeks Of Treatment: 32 Status: Clustered Wound: No Comorbid Asthma, Deep Vein Thrombosis, Hypertension, Peripheral Arterial History: Disease, Hepatitis B, Osteoarthritis, Neuropathy, Received Radiation Photos Wound Measurements Length: (cm) Width: (cm) Depth: (cm) Area: (cm) Volume: (cm) 0 % Reduction in Area: 100% 0 % Reduction in Volume: 100% 0 Epithelialization: Large (67-100%) 0 Tunneling: No 0 Undermining: No Wound Description Classification: Full Thickness With Exposed Support S Wound Margin: Distinct, outline attached Exudate Amount: None Present Annunziato, Marnette P ([id number]) Wound Bed Granulation Amount: None Present (0%) Necrotic Amount: None Present (0%) tructures Foul Odor After Cleansing: No Slough/Fibrino No [id number] Page 6 of 6 Exposed Structure Fascia Exposed: No Fat Layer (Subcutaneous Tissue) Exposed: No Tendon Exposed: No Muscle Exposed: No Joint Exposed: No Bone Exposed: No Periwound Skin Texture Texture Color No Abnormalities Noted: Yes No Abnormalities Noted: Yes Moisture Temperature / Pain No Abnormalities Noted: Yes Temperature: No Abnormality Tenderness on Palpation: Yes Treatment Notes Wound #1 (Breast) Wound Laterality: Right Cleanser Peri-Wound Care Topical Primary Dressing Secondary Dressing Secured With Compression Wrap Compression Stockings Add-Ons Electronic  Signature(s) Signed: 09/08/2022 3:48:00 PM By: [name] Creamer RN, BSN Entered By: [name] on 09/08/2022 10:19:02 -------------------------------------------------------------------------------- [place] Details Patient Name: Date of Service: [name]. 09/08/2022 10:30 A M Medical Record Number: [id number] Patient Account Number: [id number] Date of Birth/Sex: Treating RN: 1946-05-05 (77 y.o. [name] Primary Care Jamesen Stahnke: [name] Other Clinician: Referring Zeev Deakins: Treating Kerriann Kamphuis/Extender: [name] in Treatment: 32 Vital Signs Time Taken: 10:06 Temperature (F): 97.8 Height (in): 64 Pulse (bpm): 83 Weight (lbs): 180 Respiratory Rate (breaths/min): 18 Body Mass Index (BMI): 30.9 Blood Pressure (mmHg): 142/87 Reference Range: 80 - 120 mg / dl Electronic Signature(s) Signed: 09/08/2022 3:48:00 PM By: [name] Creamer RN, BSN Entered By: [name] on 09/08/2022 10:06:52

## 2022-09-13 NOTE — Progress Notes (Signed)
CADIA, COBO ([id number]) [id number] Page 1 of 7 Visit Report for 08/04/2022 Arrival Information Details Patient Name: Date of Service: [name], [name] 08/04/2022 2:15 PM Medical Record Number: [id number] Patient Account Number: [id number] Date of Birth/Sex: Treating RN: 25-Feb-1946 (77 y.o. [name], [name] Primary Care Saryna Kneeland: [name] Other Clinician: Referring Quintavia Rogstad: Treating Ahaana Rochette/Extender: [name] in Treatment: 27 Visit Information History Since Last Visit Added or deleted any medications: No Patient Arrived: Ambulatory Any new allergies or adverse reactions: No Arrival Time: 14:07 Had a fall or experienced change in No Accompanied By: self activities of daily living that may affect Transfer Assistance: None risk of falls: Patient Identification Verified: Yes Signs or symptoms of abuse/neglect since last visito No Secondary Verification Process Completed: Yes Hospitalized since last visit: No Patient Requires Transmission-Based Precautions: No Implantable device outside of the clinic excluding No Patient Has Alerts: Yes cellular tissue based products placed in the center Patient Alerts: Patient on Blood Thinner since last visit: Has Dressing in Place as Prescribed: Yes Pain Present Now: No Electronic Signature(s) Signed: 09/13/2022 10:40:58 AM By: Rhae Hammock RN Entered By: Rhae Hammock on 08/04/2022 14:07:38 -------------------------------------------------------------------------------- Clinic Level of Care Assessment Details Patient Name: Date of Service: [name], [name] 08/04/2022 2:15 PM Medical Record Number: [id number] Patient Account Number: [id number] Date of Birth/Sex: Treating RN: 1945-10-18 (77 y.o. [name] Primary Care Elton Heid: [name] Other Clinician: Referring Kimberle Stanfill: Treating Analaura Messler/Extender: [name] in  Treatment: 27 Clinic Level of Care Assessment Items TOOL 4 Quantity Score X- 1 0 Use when only an EandM is performed on FOLLOW-UP visit ASSESSMENTS - Nursing Assessment / Reassessment X- 1 10 Reassessment of Co-morbidities (includes updates in patient status) X- 1 5 Reassessment of Adherence to Treatment Plan ASSESSMENTS - Wound and Skin A ssessment / Reassessment X - Simple Wound Assessment / Reassessment - one wound 1 5 []$  - 0 Complex Wound Assessment / Reassessment - multiple wounds []$  - 0 Dermatologic / Skin Assessment (not related to wound area) ASSESSMENTS - Focused Assessment []$  - 0 Circumferential Edema Measurements - multi extremities []$  - 0 Nutritional Assessment / Counseling / Intervention [name], [name] ([id number] Page 2 of 7 []$  - 0 Lower Extremity Assessment (monofilament, tuning fork, pulses) []$  - 0 Peripheral Arterial Disease Assessment (using hand held doppler) ASSESSMENTS - Ostomy and/or Continence Assessment and Care []$  - 0 Incontinence Assessment and Management []$  - 0 Ostomy Care Assessment and Management (repouching, etc.) PROCESS - Coordination of Care X - Simple Patient / Family Education for ongoing care 1 15 []$  - 0 Complex (extensive) Patient / Family Education for ongoing care X- 1 10 Staff obtains Programmer, systems, Records, T Results / Process Orders est []$  - 0 Staff telephones HHA, Nursing Homes / Clarify orders / etc []$  - 0 Routine Transfer to another Facility (non-emergent condition) []$  - 0 Routine Hospital Admission (non-emergent condition) []$  - 0 New Admissions / Biomedical engineer / Ordering NPWT Apligraf, etc. , []$  - 0 Emergency Hospital Admission (emergent condition) X- 1 10 Simple Discharge Coordination []$  - 0 Complex (extensive) Discharge Coordination PROCESS - Special Needs []$  - 0 Pediatric / Minor Patient Management []$  - 0 Isolation Patient Management []$  - 0 Hearing / Language /  Visual special needs []$  - 0 Assessment of Community assistance (transportation, D/C planning, etc.) []$  - 0 Additional assistance / Altered mentation []$  - 0 Support Surface(s) Assessment (bed, cushion, seat, etc.) INTERVENTIONS - Wound Cleansing / Measurement  X - Simple Wound Cleansing - one wound 1 5 []$  - 0 Complex Wound Cleansing - multiple wounds X- 1 5 Wound Imaging (photographs - any number of wounds) []$  - 0 Wound Tracing (instead of photographs) X- 1 5 Simple Wound Measurement - one wound []$  - 0 Complex Wound Measurement - multiple wounds INTERVENTIONS - Wound Dressings X - Small Wound Dressing one or multiple wounds 1 10 []$  - 0 Medium Wound Dressing one or multiple wounds []$  - 0 Large Wound Dressing one or multiple wounds []$  - 0 Application of Medications - topical []$  - 0 Application of Medications - injection INTERVENTIONS - Miscellaneous []$  - 0 External ear exam []$  - 0 Specimen Collection (cultures, biopsies, blood, body fluids, etc.) []$  - 0 Specimen(s) / Culture(s) sent or taken to Lab for analysis []$  - 0 Patient Transfer (multiple staff / Civil Service fast streamer / Similar devices) []$  - 0 Simple Staple / Suture removal (25 or less) []$  - 0 Complex Staple / Suture removal (26 or more) []$  - 0 Hypo / Hyperglycemic Management (close monitor of Blood Glucose) [name], [name] ([id number] Page 3 of 7 []$  - 0 Ankle / Brachial Index (ABI) - do not check if billed separately X- 1 5 Vital Signs Has the patient been seen at the hospital within the last three years: Yes Total Score: 85 Level Of Care: New/Established - Level 3 Electronic Signature(s) Signed: 08/04/2022 5:22:16 PM By: Deon Pilling RN, BSN Entered By: Deon Pilling on 08/04/2022 16:15:02 -------------------------------------------------------------------------------- Complex / Palliative Patient Assessment Details Patient Name: Date of Service: [name] 08/04/2022  2:15 PM Medical Record Number: [id number] Patient Account Number: [id number] Date of Birth/Sex: Treating RN: 1945-09-20 (77 y.o. [name] Primary Care Eliya Bubar: [name] Other Clinician: Referring Enrigue Hashimi: Treating Takita Riecke/Extender: [name] in Treatment: 27 Complex Wound Management Criteria Patient has remarkable or complex co-morbidities requiring medications or treatments that extend wound healing times. Examples: Diabetes mellitus with chronic renal failure or end stage renal disease requiring dialysis Advanced or poorly controlled rheumatoid arthritis Diabetes mellitus and end stage chronic obstructive pulmonary disease Active cancer with current chemo- or radiation therapy neuromuscular disorder, hepatitis B, HTN, PAD, DVT Hx breast Ca with radiation, OA, asthma , Palliative Wound Management Criteria Care Approach Wound Care Plan: Complex Wound Management Electronic Signature(s) Signed: 08/13/2022 10:19:57 PM By: Deon Pilling RN, BSN Signed: 08/25/2022 4:50:55 PM By: Worthy Keeler PA-C Entered By: Deon Pilling on 08/13/2022 22:19:57 -------------------------------------------------------------------------------- Encounter Discharge Information Details Patient Name: Date of Service: [name] 08/04/2022 2:15 PM Medical Record Number: [id number] Patient Account Number: [id number] Date of Birth/Sex: Treating RN: 14-Mar-1946 (77 y.o. [name] Primary Care Symone Cornman: [name] Other Clinician: Referring Hoyte Ziebell: Treating Jalysa Swopes/Extender: [name] in Treatment: 27 Encounter Discharge Information Items Discharge Condition: Stable Ambulatory Status: Ambulatory Discharge Destination: Home Transportation: Private Auto Accompanied By: self Schedule Follow-up Appointment: Yes Clinical Summary of Care: Electronic Signature(s) Signed: 08/04/2022 5:22:16 PM By: Deon Pilling RN,  BSN [name], [name] ([id number]) PM By: Deon Pilling RN, BSN [phone].pdf Page 4 of 7 Signed: 08/04/2022 5:22:16 Entered By: Deon Pilling on 08/04/2022 16:15:28 -------------------------------------------------------------------------------- Lower Extremity Assessment Details Patient Name: Date of Service: [name], [name] 08/04/2022 2:15 PM Medical Record Number: [id number] Patient Account Number: [id number] Date of Birth/Sex: Treating RN: [birth year] (77 y.o. [name], [name] Primary Care Danil Wedge: [name] Other Clinician: Referring Tyrisha Benninger: Treating Lestat Golob/Extender: Renae Fickle Weeks in Treatment: [phone]  Electronic Signature(s) Signed: 09/13/2022 10:40:58 AM By: Rhae Hammock RN Entered By: Rhae Hammock on 08/04/2022 14:08:06 -------------------------------------------------------------------------------- Multi-Disciplinary Care Plan Details Patient Name: Date of Service: [name] 08/04/2022 2:15 PM Medical Record Number: [id number] Patient Account Number: [id number] Date of Birth/Sex: Treating RN: May 24, 1946 (77 y.o. [name] Primary Care Hodaya Curto: [name] Other Clinician: Referring Madora Barletta: Treating Tuvia Woodrick/Extender: [name] in Treatment: 27 Active Inactive Wound/Skin Impairment Nursing Diagnoses: Impaired tissue integrity Knowledge deficit related to ulceration/compromised skin integrity Goals: Patient will have a decrease in wound volume by X% from date: (specify in notes) Date Initiated: 01/27/2022 Target Resolution Date: 09/16/2022 Goal Status: Active Patient/caregiver will verbalize understanding of skin care regimen Date Initiated: 01/27/2022 Date Inactivated: 05/05/2022 Target Resolution Date: 04/23/2022 Goal Status: Met Ulcer/skin breakdown will have a volume reduction of 30% by week 4 Date Initiated: 01/27/2022 Date Inactivated:  08/04/2022 Target Resolution Date: 07/19/2022 Unmet Reason: see wound Goal Status: Unmet measurement. Interventions: Assess patient/caregiver ability to obtain necessary supplies Assess patient/caregiver ability to perform ulcer/skin care regimen upon admission and as needed Assess ulceration(s) every visit Notes: Electronic Signature(s) Signed: 08/04/2022 5:22:16 PM By: Deon Pilling RN, BSN Entered By: Deon Pilling on 08/04/2022 16:13:44 [name], [name] ([id number] Page 5 of 7 -------------------------------------------------------------------------------- Pain Assessment Details Patient Name: Date of Service: [name], [name] 08/04/2022 2:15 PM Medical Record Number: [id number] Patient Account Number: [id number] Date of Birth/Sex: Treating RN: [birth year] (77 y.o. [name], [name] Primary Care Kadeidra Coryell: [name] Other Clinician: Referring Jet Traynham: Treating Reyann Troop/Extender: [name] in Treatment: 27 Active Problems Location of Pain Severity and Description of Pain Patient Has Paino No Site Locations Pain Management and Medication Current Pain Management: Electronic Signature(s) Signed: 09/13/2022 10:40:58 AM By: Rhae Hammock RN Entered By: Rhae Hammock on 08/04/2022 14:08:01 -------------------------------------------------------------------------------- Patient/Caregiver Education Details Patient Name: Date of Service: [name] 1/10/2024andnbsp2:15 PM Medical Record Number: [id number] Patient Account Number: [id number] Date of Birth/Gender: Treating RN: 1946-03-24 (77 y.o. [name] Primary Care Physician: [name] Other Clinician: Referring Physician: Treating Physician/Extender: [name] in Treatment: 27 Education Assessment Education Provided To: Patient Education Topics Provided Wound/Skin Impairment: Handouts: Caring  for Your Ulcer Methods: Explain/Verbal Responses: Reinforcements needed [name], [name] ([id number]) [phone].pdf Page 6 of 7 Electronic Signature(s) Signed: 08/04/2022 5:22:16 PM By: Deon Pilling RN, BSN Entered By: Deon Pilling on 08/04/2022 16:14:23 -------------------------------------------------------------------------------- Wound Assessment Details Patient Name: Date of Service: [name], [name] 08/04/2022 2:15 PM Medical Record Number: [id number] Patient Account Number: [id number] Date of Birth/Sex: Treating RN: 12/20/1945 (77 y.o. 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 Event: Surgical Injury Etiology: Date Acquired: 08/26/2021 Wound Open Weeks Of Treatment: 27 Status: Clustered Wound: No Comorbid Asthma, Deep Vein Thrombosis, Hypertension, Peripheral Arterial History: Disease, Hepatitis B, Osteoarthritis, Neuropathy, Received Radiation Photos Wound Measurements Length: (cm) 0.3 Width: (cm) 0.2 Depth: (cm) 0.5 Area: (cm) 0.047 Volume: (cm) 0.024 % Reduction in Area: 33.8% % Reduction in Volume: 66.2% Epithelialization: Medium (34-66%) Tunneling: No Undermining: Yes Starting Position (o'clock): 10 Ending Position (o'clock): 12 Maximum Distance: (cm) 1 Wound Description Classification: Full Thickness With Exposed Suppo Wound Margin: Distinct, outline attached Exudate Amount: Medium Exudate Type: Serosanguineous Exudate Color: red, brown rt Structures Foul Odor After Cleansing: No Slough/Fibrino No Wound Bed  Granulation Amount: Large (67-100%) Exposed Structure Granulation Quality: Red Fascia Exposed: No Necrotic Amount: None Present (0%) Fat Layer  (Subcutaneous Tissue) Exposed: Yes Tendon Exposed: No Muscle Exposed: No Joint Exposed: No Bone Exposed: No [name], [name] ([id number] Page 7 of 7 Periwound Skin Texture Texture Color No Abnormalities Noted: Yes No Abnormalities Noted: Yes Moisture Temperature / Pain No Abnormalities Noted: Yes Temperature: No Abnormality Tenderness on Palpation: Yes Electronic Signature(s) Signed: 09/13/2022 10:40:58 AM By: Rhae Hammock RN Entered By: Rhae Hammock on 08/04/2022 14:12:23 -------------------------------------------------------------------------------- Vitals Details Patient Name: Date of Service: [name] 08/04/2022 2:15 PM Medical Record Number: [id number] Patient Account Number: [id number] Date of Birth/Sex: Treating RN: 1945/08/10 (77 y.o. [name], [name] Primary Care Malorie Bigford: [name] Other Clinician: Referring Ignatius Kloos: Treating Kamarii Buren/Extender: [name] in Treatment: 27 Vital Signs Time Taken: 14:07 Temperature (F): 98.7 Height (in): 64 Pulse (bpm): 74 Weight (lbs): 180 Respiratory Rate (breaths/min): 17 Body Mass Index (BMI): 30.9 Blood Pressure (mmHg): 123/86 Reference Range: 80 - 120 mg / dl Electronic Signature(s) Signed: 09/13/2022 10:40:58 AM By: Rhae Hammock RN Entered By: Rhae Hammock on 08/04/2022 14:07:56

## 2022-09-28 ENCOUNTER — Other Ambulatory Visit: Payer: Medicare HMO

## 2022-09-28 ENCOUNTER — Inpatient Hospital Stay: Payer: Medicare HMO

## 2022-09-30 ENCOUNTER — Telehealth: Payer: Self-pay | Admitting: Pharmacist Clinician (PhC)/ Clinical Pharmacy Specialist

## 2022-09-30 DIAGNOSIS — E785 Hyperlipidemia, unspecified: Secondary | ICD-10-CM

## 2022-09-30 DIAGNOSIS — I6529 Occlusion and stenosis of unspecified carotid artery: Secondary | ICD-10-CM

## 2022-09-30 MED ORDER — REPATHA PUSHTRONEX SYSTEM 420 MG/3.5ML ~~LOC~~ SOCT: 420.0000 mg | SUBCUTANEOUS | 3 refills | Status: DC

## 2022-09-30 NOTE — Telephone Encounter (Signed)
Pt c/o medication issue:  1. Name of Medication:   Evolocumab with Infusor ([place]) 420 MG/3.5ML SOCT   2. How are you currently taking this medication (dosage and times per day)?   As prescribed  3. Are you having a reaction (difficulty breathing--STAT)?   No  4. What is your medication issue?   Patient states she messed up her first shot and still has to get one more shot.  Patient wants a prescriptions for 3 more shots.

## 2022-09-30 NOTE — Telephone Encounter (Signed)
Refill sent to pharmacy.   

## 2022-10-13 ENCOUNTER — Ambulatory Visit
Admission: RE | Admit: 2022-10-13 | Discharge: 2022-10-13 | Disposition: A | Discharge status: Home / Self Care | Payer: Medicare HMO | Source: Ambulatory Visit | Attending: Surgery | Admitting: Surgery

## 2022-10-13 DIAGNOSIS — R2231 Localized swelling, mass and lump, right upper limb: Secondary | ICD-10-CM

## 2022-10-13 MED ORDER — IOPAMIDOL (ISOVUE-300) INJECTION 61%
75.0000 mL | Freq: Once | INTRAVENOUS | Status: AC | PRN
  Administered 2022-10-13: 75 mL via INTRAVENOUS

## 2022-10-27 ENCOUNTER — Other Ambulatory Visit: Payer: Self-pay | Admitting: Surgery

## 2022-10-27 DIAGNOSIS — N6310 Unspecified lump in the right breast, unspecified quadrant: Secondary | ICD-10-CM

## 2022-10-29 ENCOUNTER — Other Ambulatory Visit: Payer: Medicare HMO

## 2022-10-29 ENCOUNTER — Inpatient Hospital Stay: Payer: Medicare HMO

## 2022-11-01 ENCOUNTER — Other Ambulatory Visit: Payer: Medicare HMO

## 2022-11-01 ENCOUNTER — Ambulatory Visit: Payer: Medicare HMO | Admitting: Hematology and Oncology

## 2022-11-26 ENCOUNTER — Other Ambulatory Visit: Payer: Medicare HMO

## 2022-11-29 ENCOUNTER — Ambulatory Visit: Payer: Medicare HMO | Admitting: Cardiology

## 2022-11-30 DIAGNOSIS — R072 Precordial pain: Secondary | ICD-10-CM | POA: Insufficient documentation

## 2022-11-30 NOTE — Progress Notes (Unsigned)
  Cardiology Office Note:   Date:  12/02/2022  ID:  [name], [name] [birth year], MRN [id number]  History of Present Illness:   [name] is a 77 y.o. female  who presents for with a hx of normal coronaries by heart cath in 1998 with nonischemic stress tests in 2012 and Jan 2019. In 04/2020 she complained of chest pain. Follow up ETT was negative for ischemia. She was in the ED in October 2023 for chest pain.  She had a coronary CTA that demonstrated 25 to 49% LAD stenosis.  OM1 had 25 to 49% stenosis.  She has had no new cardiovascular complaints.  She denies chest pressure.  She has some twinges of neck discomfort occasionally.  She has had no new shortness of breath, PND or orthopnea.  She has no palpitations, presyncope or syncope.  She has had no weight gain or edema.  She does have some neuropathy in her legs that makes exercise a little more problematic.  ROS: Neuropathic leg pain on the left.  Otherwise as stated in the HPI and negative for all other systems.  Studies Reviewed:    EKG: Sinus rhythm, rate 84, axis within normal limits, intervals within normal limits, no acute ST-T wave changes.   Risk Assessment/Calculations:              Physical Exam:   VS:  BP 138/88 (BP Location: Left Arm, Patient Position: Sitting, Cuff Size: Large)   Pulse 84   Ht 5\' 5"  (1.651 m)   Wt 186 lb 12.8 oz (84.7 kg)   SpO2 91%   BMI 31.09 kg/m    Wt Readings from Last 3 Encounters:  12/02/22 186 lb 12.8 oz (84.7 kg)  08/04/22 188 lb (85.3 kg)  05/17/22 194 lb (88 kg)     GEN: Well nourished, well developed in no acute distress NECK: No JVD; No carotid bruits CARDIAC: RRR, no murmurs, rubs, gallops RESPIRATORY:  Clear to auscultation without rales, wheezing or rhonchi  ABDOMEN: Soft, non-tender, non-distended EXTREMITIES:  No edema; No deformity   ASSESSMENT AND PLAN:   CAD: Patient has nonobstructive coronary disease and she is dissipating aggressively and risk reduction.   No change in therapy.  HTN: Her blood pressure is well-controlled.  She will continue the meds as listed.   Dyslipidemia: LDL is 79 which was down from 134.  She is not taking a PCSK9.  The she is not quite at target and I will check this again in September.  If her LDL is not into the 50s I will add Zetia.  Carotid stenosis: She is up-to-date with follow-up.  No change in therapy.  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        Signed, Rollene Rotunda, MD

## 2022-12-01 ENCOUNTER — Other Ambulatory Visit: Payer: Self-pay

## 2022-12-01 ENCOUNTER — Telehealth: Payer: Self-pay | Admitting: Cardiology

## 2022-12-01 DIAGNOSIS — I10 Essential (primary) hypertension: Secondary | ICD-10-CM

## 2022-12-01 MED ORDER — METOPROLOL TARTRATE 25 MG PO TABS: 12.5000 mg | ORAL_TABLET | Freq: Two times a day (BID) | ORAL | 0 refills | Status: DC

## 2022-12-01 NOTE — Telephone Encounter (Signed)
*  STAT* If patient is at the pharmacy, call can be transferred to refill team.   1. Which medications need to be refilled? (please list name of each medication and dose if known) metoprolol tartrate (LOPRESSOR) 25 MG tablet  2. Which pharmacy/location (including street and city if local pharmacy) is medication to be sent to? [id number] - [place], [place] - [address]  3. Do they need a 30 day or 90 day supply? 90  

## 2022-12-02 ENCOUNTER — Ambulatory Visit: Payer: Medicare HMO | Attending: Cardiology | Admitting: Cardiology

## 2022-12-02 ENCOUNTER — Encounter: Payer: Self-pay | Admitting: Cardiology

## 2022-12-02 VITALS — BP 138/88 | HR 84 | Ht 65.0 in | Wt 186.8 lb

## 2022-12-02 DIAGNOSIS — R072 Precordial pain: Secondary | ICD-10-CM

## 2022-12-02 DIAGNOSIS — I6529 Occlusion and stenosis of unspecified carotid artery: Secondary | ICD-10-CM | POA: Diagnosis not present

## 2022-12-02 DIAGNOSIS — E785 Hyperlipidemia, unspecified: Secondary | ICD-10-CM

## 2022-12-02 DIAGNOSIS — D6869 Other thrombophilia: Secondary | ICD-10-CM

## 2022-12-02 NOTE — Patient Instructions (Addendum)
Medication Instructions:  Your physician recommends that you continue on your current medications as directed. Please refer to the Current Medication list given to you today.  *If you need a refill on your cardiac medications before your next appointment, please call your pharmacy*   Lab Work: Your physician recommends that you return for lab work in: September for FASTING lipids (cholesterol)  If you have labs (blood work) drawn today and your tests are completely normal, you will receive your results only by: MyChart Message (if you have MyChart) OR A paper copy in the mail If you have any lab test that is abnormal or we need to change your treatment, we will call you to review the results.    Follow-Up: At [facility], you and your health needs are our priority.  As part of our continuing mission to provide you with exceptional heart care, we have created designated Provider Care Teams.  These Care Teams include your primary Cardiologist (physician) and Advanced Practice Providers (APPs -  Physician Assistants and Nurse Practitioners) who all work together to provide you with the care you need, when you need it.  We recommend signing up for the patient portal called "MyChart".  Sign up information is provided on this After Visit Summary.  MyChart is used to connect with patients for Virtual Visits (Telemedicine).  Patients are able to view lab/test results, encounter notes, upcoming appointments, etc.  Non-urgent messages can be sent to your provider as well.   To learn more about what you can do with MyChart, go to [email or web address].    Your next appointment:   12 month(s)  Provider:   Rollene Rotunda, MD

## 2022-12-22 ENCOUNTER — Ambulatory Visit
Admission: RE | Admit: 2022-12-22 | Discharge: 2022-12-22 | Disposition: A | Discharge status: Home / Self Care | Payer: Medicare HMO | Source: Ambulatory Visit | Attending: Surgery | Admitting: Surgery

## 2022-12-22 DIAGNOSIS — N6310 Unspecified lump in the right breast, unspecified quadrant: Secondary | ICD-10-CM

## 2023-01-18 ENCOUNTER — Telehealth: Payer: Self-pay | Admitting: Cardiology

## 2023-01-18 DIAGNOSIS — I6529 Occlusion and stenosis of unspecified carotid artery: Secondary | ICD-10-CM

## 2023-01-18 DIAGNOSIS — E785 Hyperlipidemia, unspecified: Secondary | ICD-10-CM

## 2023-01-18 NOTE — Telephone Encounter (Signed)
*  STAT* If patient is at the pharmacy, call can be transferred to refill team.   1. Which medications need to be refilled? (please list name of each medication and dose if known)   Evolocumab with Infusor (REPATHA PUSHTRONEX SYSTEM) 420 MG/3.5ML SOCT   2. Which pharmacy/location (including street and city if local pharmacy) is medication to be sent to?  [id number] - [place], [place] - [name]   3. Do they need a 30 day or 90 day supply?   90 day  Patient stated she is out of this medication.

## 2023-01-19 MED ORDER — REPATHA PUSHTRONEX SYSTEM 420 MG/3.5ML ~~LOC~~ SOCT
420.0000 mg | SUBCUTANEOUS | 3 refills | Status: DC
Start: 2023-01-19 — End: 2023-01-24

## 2023-01-19 NOTE — Telephone Encounter (Signed)
Refill sent in

## 2023-01-24 MED ORDER — REPATHA SURECLICK 140 MG/ML ~~LOC~~ SOAJ: 140.0000 mg | SUBCUTANEOUS | 3 refills | Status: DC

## 2023-01-24 NOTE — Telephone Encounter (Signed)
Pt called clinic, states pharmacy can't order Repatha 420mg . Changed rx to 140mg  Q2W, no latex allergy noted, counseled pt on difference in injection technique. She was appreciative for the assistance.

## 2023-01-24 NOTE — Addendum Note (Signed)
Addended by: Denasia Venn E on: 01/24/2023 02:19 PM   Modules accepted: Orders

## 2023-02-22 ENCOUNTER — Other Ambulatory Visit: Payer: Self-pay | Admitting: Cardiology

## 2023-02-22 DIAGNOSIS — I10 Essential (primary) hypertension: Secondary | ICD-10-CM

## 2023-02-25 ENCOUNTER — Ambulatory Visit
Admission: RE | Admit: 2023-02-25 | Discharge: 2023-02-25 | Disposition: A | Discharge status: Home / Self Care | Payer: Medicare HMO | Source: Ambulatory Visit | Attending: Family Medicine | Admitting: Family Medicine

## 2023-02-25 ENCOUNTER — Other Ambulatory Visit: Payer: Self-pay | Admitting: Family Medicine

## 2023-02-25 DIAGNOSIS — G8929 Other chronic pain: Secondary | ICD-10-CM

## 2023-05-09 ENCOUNTER — Encounter: Payer: Self-pay | Admitting: *Deleted

## 2023-05-20 LAB — LIPID PANEL
Chol/HDL Ratio: 4.4 {ratio} (ref 0.0–4.4)
Cholesterol, Total: 170 mg/dL (ref 100–199)
HDL: 39 mg/dL — ABNORMAL LOW (ref >39)
LDL Chol Calc (NIH): 96 mg/dL (ref 0–99)
Triglycerides: 203 mg/dL — ABNORMAL HIGH (ref 0–149)
VLDL Cholesterol Cal: 35 mg/dL (ref 5–40)

## 2023-05-23 ENCOUNTER — Emergency Department (HOSPITAL_COMMUNITY)
Admission: EM | Admit: 2023-05-23 | Discharge: 2023-05-23 | Disposition: A | Discharge status: Home / Self Care | Payer: Medicare HMO | Attending: Emergency Medicine | Admitting: Emergency Medicine

## 2023-05-23 ENCOUNTER — Telehealth: Payer: Self-pay | Admitting: *Deleted

## 2023-05-23 ENCOUNTER — Encounter (HOSPITAL_COMMUNITY): Payer: Self-pay | Admitting: Emergency Medicine

## 2023-05-23 ENCOUNTER — Other Ambulatory Visit: Payer: Self-pay

## 2023-05-23 DIAGNOSIS — M79652 Pain in left thigh: Secondary | ICD-10-CM | POA: Diagnosis present

## 2023-05-23 DIAGNOSIS — Z853 Personal history of malignant neoplasm of breast: Secondary | ICD-10-CM | POA: Insufficient documentation

## 2023-05-23 DIAGNOSIS — Z79899 Other long term (current) drug therapy: Secondary | ICD-10-CM | POA: Insufficient documentation

## 2023-05-23 DIAGNOSIS — Z7982 Long term (current) use of aspirin: Secondary | ICD-10-CM | POA: Diagnosis not present

## 2023-05-23 DIAGNOSIS — J45909 Unspecified asthma, uncomplicated: Secondary | ICD-10-CM | POA: Diagnosis not present

## 2023-05-23 DIAGNOSIS — I10 Essential (primary) hypertension: Secondary | ICD-10-CM | POA: Insufficient documentation

## 2023-05-23 DIAGNOSIS — E785 Hyperlipidemia, unspecified: Secondary | ICD-10-CM

## 2023-05-23 MED ORDER — EZETIMIBE 10 MG PO TABS
10.0000 mg | ORAL_TABLET | Freq: Every day | ORAL | 3 refills | Status: DC
Start: 2023-05-23 — End: 2023-08-31

## 2023-05-23 NOTE — ED Provider Notes (Signed)
[facility] AT [facility] Provider Note  CSN: [id number] Arrival date & time: 05/23/23 1807  Chief Complaint(s) Leg Pain  HPI [name] is a 77 y.o. 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  Pain was sudden onset and rapidly worsen rapidly worsened.  Patient went to urgent care who recommended she come straight to the emergency department for evaluation of possible DVT.  Since arriving, the pain have subsided.  Pain is worse with certain movements and palpation.  Also has pain with standing.  She denies any falls or trauma.  No swelling or edema.  The history is provided by the patient.    Past Medical History Past Medical History:  Diagnosis Date   Anxiety    severe panic attacks   Arthritis    Aseptic meningitis    Asthma    as child   Breast cancer (HCC)    right   Chronic anticoagulation 08/04/2015   Collapsed lung    hx of, left; following left thoracentesis for post-operative pleural effusion > 20 years ago   Complication of anesthesia    "hard to put asleep"; awareness under anesthesia > 35 years ago for hysterectomy   Depression    Fibromyalgia    GERD (gastroesophageal reflux disease)    H/O hiatal hernia    Headache(784.0)    Hepatitis 1990   "from eating at restaurant"   History of antiphospholipid antibody syndrome    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   Hx-TIA (transient ischemic attack)    Hyperlipidemia    Hypertension    Hypothyroidism    Neuromuscular disorder (HCC)    Personal history of radiation therapy    Pneumonia    hx of   Soft tissue mass 06/14/2017   3 cm right post axilla same side as previous breast cancer 06/14/17   Stroke (HCC)    Thrombosis, 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   Left digital artery  October 1998 - new dx antiphospholipid antibody  syndrome   Patient Active Problem List   Diagnosis Date Noted   Secondary hypercoagulable state (HCC) 12/02/2022   Precordial chest pain 11/30/2022   FH: colon cancer 11/17/2021   Creatinine elevation 07/01/2021   Acute urinary tract infection 07/01/2021   Allergic to bugs 07/01/2021   Antiphospholipid antibody positive 07/01/2021   Anxiety 07/01/2021   Bug bite with infection 07/01/2021   Cellulitis 07/01/2021   Cold sore 07/01/2021   Costal chondritis 07/01/2021   Cramps of lower extremity 07/01/2021   Difficult or painful urination 07/01/2021   Encounter for general adult medical examination without abnormal findings 07/01/2021   Family history of malignant neoplasm of gastrointestinal tract 07/01/2021   Hematochezia 07/01/2021   Abnormal findings on diagnostic imaging of other parts of digestive tract 07/01/2021   Need for vaccination 07/01/2021   Pain in rib 07/01/2021   History of colonic polyps 07/01/2021   Restless leg 07/01/2021   Right lower quadrant pain 07/01/2021   Tick bite 07/01/2021   Weight loss 07/01/2021   Carotid stenosis, symptomatic w/o infarct, right 04/23/2021   Diaphragmatic hernia 01/08/2021   Flatulence, eructation and gas pain 01/08/2021   History of breast cancer 07/09/2020   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   Impacted cerumen of right ear 04/07/2020   Lump in lower outer quadrant of right breast  02/08/2019   Diarrhea 06/29/2018   Nausea and vomiting 06/29/2018   Elevated blood sugar 06/20/2018   Recurrent major depressive disorder, in partial remission (HCC) 06/20/2018   Bilateral primary osteoarthritis of knee 12/27/2017   Acute infection of nasal sinus 07/21/2017   Prediabetes 06/29/2017   Soft tissue mass 06/14/2017   Displaced fracture of fifth metatarsal bone of right foot 02/07/2017   Unilateral primary osteoarthritis, left knee 11/26/2016   Unilateral primary osteoarthritis, right knee 11/26/2016   Left leg pain     Abnormal MRI of head    Vision changes 04/13/2016   Orthostatic hypotension 03/10/2016   Vertigo 03/10/2016   Depression 11/25/2015   Essential (primary) hypertension 11/25/2015   Hyperlipidemia 11/25/2015   Temporary cerebral vascular dysfunction 11/25/2015   Primary malignant neoplasm (HCC) 11/25/2015   Sleep apnea 11/25/2015   Chronic anticoagulation 08/04/2015   History of anticoagulant therapy 08/04/2015   Pain in limb 12/26/2013   Chest pain 11/27/2013   GERD - post failed open Nissen 11/23/2012   Dysphagia 09/28/2012   Dysphagia 09/28/2012   S/P Open Nissen 1998 Dr. 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 Authorizing Provider  ALPRAZolam Prudy Feeler) 1 MG tablet Take 1 mg by mouth 2 (two) times daily.  12/22/19   [provider]  Ascorbic Acid (VITAMIN C) 1000 MG tablet Take 1,000 mg by mouth daily.    [provider]  aspirin 81 MG tablet Take 81 mg by mouth every evening.    [provider]  Cyanocobalamin (VITAMIN B 12) 500 MCG TABS Take 500 mcg by mouth daily.    [provider]  EPINEPHrine 0.3 mg/0.3 mL IJ SOAJ injection Inject 0.3 mg into the muscle as needed for anaphylaxis. 02/16/22   Sloan Leiter, DO  Evolocumab (REPATHA SURECLICK) 140 MG/ML SOAJ Inject 140 mg into the skin every 14 (fourteen) days. 01/24/23   Rollene Rotunda, MD  ezetimibe (ZETIA) 10 MG tablet Take 1 tablet (10 mg total) by mouth daily. 05/23/23   Rollene Rotunda, MD  fluticasone (FLONASE) 50 MCG/ACT nasal  spray Place 2 sprays into the nose daily as needed for allergies.  10/16/14   [provider]  HYDROcodone-acetaminophen (NORCO/VICODIN) 5-325 MG tablet Take 1 tablet by mouth every 6 (six) hours as needed for moderate pain. 09/15/21   Cornett, Maisie Fus, MD  levothyroxine (SYNTHROID, LEVOTHROID) 112 MCG tablet Take 112 mcg by mouth See admin instructions. Take 112 mcg by mouth daily on Sun, Tues, Wed, Thurs, Sat    [provider]  levothyroxine (SYNTHROID, LEVOTHROID) 125 MCG tablet Take 125 mcg by mouth See admin instructions. Take 125 mcg by mouth on Monday and Friday 06/24/16   [provider]  magnesium oxide (MAG-OX) 400 MG tablet Take 400 mg by mouth daily.    [provider]  metoprolol tartrate (LOPRESSOR) 25 MG tablet TAKE 0.5 TABLETS BY MOUTH 2 TIMES DAILY. 02/23/23   Rollene Rotunda, MD  omeprazole (PRILOSEC) 20 MG capsule Take 20 mg by mouth daily.  02/06/13   [provider]  warfarin (COUMADIN) 5 MG tablet Take 5 mg daily except on Mondays: take 2.5 mg (1/2 tablet) 12/29/21   Serena Croissant, MD                                                                                                                                    Allergies Bee venom, Hornet venom, Reglan [metoclopramide], Vitamin k, Baclofen, Cephalexin, Benadryl [diphenhydramine hcl], Ciprofloxacin, Crestor [rosuvastatin calcium], Cymbalta [duloxetine hcl], Daypro [oxaprozin], Doxycycline, Levofloxacin, Sulfonamide derivatives, and Zocor [simvastatin]  Review of Systems Review of Systems As noted in HPI  Physical Exam Vital Signs  I have reviewed the triage vital signs BP 123/78   Pulse 75   Temp 98 F (36.7 C) (Oral)   Resp 18   SpO2 91%   Physical Exam Vitals reviewed.  Constitutional:      General: She is not in acute distress.    Appearance: She is well-developed. She is not diaphoretic.  HENT:     Head: Normocephalic and atraumatic.     Right Ear: External ear normal.      Left Ear: External ear normal.     Nose: Nose normal.  Eyes:     General: No scleral icterus.    Conjunctiva/sclera: Conjunctivae normal.  Neck:     Trachea: Phonation normal.  Cardiovascular:     Rate and Rhythm: Normal rate and regular rhythm.  Pulmonary:     Effort: Pulmonary effort is normal. No respiratory distress.     Breath sounds: No stridor.  Abdominal:     General: There is no distension.  Musculoskeletal:        General: Normal range of motion.     Cervical back: Normal range of motion.     Right upper leg: No edema, deformity, tenderness or bony tenderness.     Left upper leg: Tenderness present. No edema, deformity or bony tenderness.     Right foot: Normal pulse.     Left foot: Normal pulse.       Legs:     Comments: No tender palpable cord  Neurological:     Mental Status: She is alert and oriented to person, place, and time.  Psychiatric:        Behavior: Behavior normal.     ED Results and Treatments Labs (all labs ordered are listed, but only abnormal results are displayed) Labs Reviewed - No data to display  EKG  EKG Interpretation Date/Time:    Ventricular Rate:    PR Interval:    QRS Duration:    QT Interval:    QTC Calculation:   R Axis:      Text Interpretation:         Radiology No results found.  Medications Ordered in ED Medications - No data to display Procedures Procedures  (including critical care time) Medical Decision Making / ED Course   Medical Decision Making    Left thigh pain. Pain is improving.  No associated swelling.  Pulses intact, doubt arterial occlusion. With history of DVT and recent INR of 1.81-week ago, will need to rule out DVT.  At this time of night we do not have ultrasound.  Patient reports that since her INR was subtherapeutic 1 week ago she is increased her dose of Coumadin  over the past week.  She declined getting an INR at this time.  Will need to return in the morning for DVT ultrasound.  If ultrasound is negative, given the presentation, pain is likely muscular in nature.    Final Clinical Impression(s) / ED Diagnoses Final diagnoses:  Left thigh pain   The patient appears reasonably screened and/or stabilized for discharge and I doubt any other medical condition or other [facility] requiring further screening, evaluation, or treatment in the ED at this time. I have discussed the findings, Dx and Tx plan with the patient/family who expressed understanding and agree(s) with the plan. Discharge instructions discussed at length. The patient/family was given strict return precautions who verbalized understanding of the instructions. No further questions at time of discharge.  Disposition: Discharge  Condition: Good  ED Discharge Orders          Ordered     05/23/23 2314    LE Venous       Comments: IMPORTANT PATIENT INSTRUCTIONS:  You have been scheduled for an Outpatient Vascular Study at [facility].    If tomorrow is a Saturday, Sunday or holiday, please go to the [facility] Emergency Department Registration Desk at 11 am tomorrow morning and tell them you are there for a vascular study.   If tomorrow is a weekday (Monday-Friday), please go to [facility] Entr[facility] Registration at 11 am and tell them you are there for a vascular study.   05/23/23 2316               This chart was dictated using voice recognition software.  Despite best efforts to proofread,  errors can occur which can change the documentation meaning.    Nira Conn, MD 05/23/23 [phone]

## 2023-05-23 NOTE — Telephone Encounter (Signed)
-----   Message from Rollene Rotunda sent at 05/20/2023  8:50 AM EDT ----- LDL still mildly elevated.  Please add Zetia 10 mg p.o. daily and write a prescription for 90 tablets with 3 refills.  Please check a lipid profile in 3 months.

## 2023-05-23 NOTE — ED Triage Notes (Signed)
Patient presents with medial left thigh pain all day, but it has worsened over the last 2 hours. Urgent care told her to come here due to concern for a blood clot.

## 2023-05-23 NOTE — Telephone Encounter (Signed)
Pt has reviewed results via my chart  New script sent to the pharmacy  Lab orders mailed to the pt  

## 2023-05-23 NOTE — ED Notes (Signed)
Pt is complaining of new onset headache. Vitals taken/reviewed by triage nurse RN Greig Castilla.

## 2023-05-24 ENCOUNTER — Ambulatory Visit (HOSPITAL_COMMUNITY)
Admission: RE | Admit: 2023-05-24 | Discharge: 2023-05-24 | Disposition: A | Discharge status: Home / Self Care | Payer: Medicare HMO | Source: Ambulatory Visit | Attending: Emergency Medicine | Admitting: Emergency Medicine

## 2023-05-24 DIAGNOSIS — Z7901 Long term (current) use of anticoagulants: Secondary | ICD-10-CM | POA: Insufficient documentation

## 2023-05-24 DIAGNOSIS — Z86718 Personal history of other venous thrombosis and embolism: Secondary | ICD-10-CM | POA: Insufficient documentation

## 2023-05-24 NOTE — Progress Notes (Signed)
Lower extremity venous duplex completed. Please see CV Procedures for preliminary results.  Shona Simpson, RVT 05/24/23 11:29 AM

## 2023-06-27 ENCOUNTER — Other Ambulatory Visit (HOSPITAL_COMMUNITY): Payer: Self-pay

## 2023-06-27 ENCOUNTER — Telehealth: Payer: Self-pay | Admitting: Pharmacist Clinician (PhC)/ Clinical Pharmacy Specialist

## 2023-06-27 ENCOUNTER — Telehealth: Payer: Self-pay | Admitting: Pharmacy Technician

## 2023-06-27 NOTE — Telephone Encounter (Signed)
Please check to see if she needs renewal on Reaptha prior auth.  (It could be a refill too soon - not really sure)  thanks

## 2023-06-27 NOTE — Telephone Encounter (Signed)
Pharmacy Patient Advocate Encounter   Received notification from Pt Calls Messages that prior authorization for repatha is required/requested.   Insurance verification completed.   The patient is insured through [employer] .   Per test claim: PA required; PA submitted to above mentioned insurance via CoverMyMeds Key/confirmation #/EOC [id number] Status is pending

## 2023-06-27 NOTE — Telephone Encounter (Signed)
Pharmacy Patient Advocate Encounter  Received notification from [facility] that Prior Authorization for repatha has been APPROVED from 07/26/22 to 07/25/24. Ran test claim, Copay is $132.03- one month (GAP). This test claim was processed through [facility]- copay amounts may vary at other pharmacies due to pharmacy/plan contracts, or as the patient moves through the different stages of their insurance plan.   PA #/Case ID/Reference #: [id number]

## 2023-06-28 ENCOUNTER — Other Ambulatory Visit: Payer: Self-pay | Admitting: Cardiology

## 2023-06-28 DIAGNOSIS — E785 Hyperlipidemia, unspecified: Secondary | ICD-10-CM

## 2023-06-28 MED ORDER — REPATHA SURECLICK 140 MG/ML ~~LOC~~ SOAJ: 140.0000 mg | SUBCUTANEOUS | 3 refills | Status: DC

## 2023-06-28 NOTE — Telephone Encounter (Signed)
Rx sent to [facility] per patient request

## 2023-06-28 NOTE — Addendum Note (Signed)
Addended by: Rosalee Kaufman on: 06/28/2023 08:20 AM   Modules accepted: Orders

## 2023-07-05 ENCOUNTER — Ambulatory Visit (HOSPITAL_BASED_OUTPATIENT_CLINIC_OR_DEPARTMENT_OTHER): Payer: Medicare HMO | Admitting: Physician Assistant

## 2023-07-15 ENCOUNTER — Telehealth: Payer: Self-pay | Admitting: Cardiology

## 2023-07-15 NOTE — Telephone Encounter (Signed)
Pt wants to move some of her medications to [facility] pharmacy. Please advise

## 2023-07-15 NOTE — Telephone Encounter (Signed)
 Called patient with no answer. Left message to return call.

## 2023-07-18 NOTE — Telephone Encounter (Signed)
 2nd attempt to call patient, no answer left message requesting a call back.

## 2023-07-19 NOTE — Telephone Encounter (Signed)
 3rd attempt to call patient, no answer, left message requesting a call back Nursing will await for patient to return call

## 2023-08-29 ENCOUNTER — Other Ambulatory Visit: Payer: Self-pay | Admitting: Cardiology

## 2023-08-29 DIAGNOSIS — E785 Hyperlipidemia, unspecified: Secondary | ICD-10-CM

## 2023-08-31 ENCOUNTER — Encounter: Payer: Self-pay | Admitting: *Deleted

## 2023-09-09 LAB — LIPID PANEL
Chol/HDL Ratio: 5.2 {ratio} — ABNORMAL HIGH (ref 0.0–4.4)
Cholesterol, Total: 229 mg/dL — ABNORMAL HIGH (ref 100–199)
HDL: 44 mg/dL (ref >39)
LDL Chol Calc (NIH): 138 mg/dL — ABNORMAL HIGH (ref 0–99)
Triglycerides: 262 mg/dL — ABNORMAL HIGH (ref 0–149)
VLDL Cholesterol Cal: 47 mg/dL — ABNORMAL HIGH (ref 5–40)

## 2023-09-12 ENCOUNTER — Other Ambulatory Visit: Payer: Self-pay

## 2023-09-12 ENCOUNTER — Telehealth: Payer: Self-pay

## 2023-09-12 DIAGNOSIS — E785 Hyperlipidemia, unspecified: Secondary | ICD-10-CM

## 2023-09-12 NOTE — Telephone Encounter (Signed)
-----   Message from Rollene Rotunda sent at 09/09/2023  5:39 PM EST ----- LDL is elevated.  Is she getting the Zetia and the Repatha?  Please have her follow up with Pharm D Lipid Clinic.  Thanks.

## 2023-09-12 NOTE — Telephone Encounter (Signed)
Called and left VMM for patient informing her that her lab level was still not at goal and that I was placing an order for a PharmD consult.  She has both Zetia and Repatha listed on her med list but unable to verify that she is currently taking both. Let her know that the PharmD group would be calling her to discuss these medications.

## 2023-10-05 ENCOUNTER — Encounter: Payer: Self-pay | Admitting: Cardiology

## 2023-10-11 ENCOUNTER — Other Ambulatory Visit: Payer: Self-pay | Admitting: Cardiology

## 2023-10-11 DIAGNOSIS — E785 Hyperlipidemia, unspecified: Secondary | ICD-10-CM

## 2023-11-23 ENCOUNTER — Other Ambulatory Visit: Payer: Self-pay | Admitting: Cardiology

## 2023-11-23 DIAGNOSIS — I10 Essential (primary) hypertension: Secondary | ICD-10-CM

## 2023-12-13 ENCOUNTER — Encounter: Payer: Self-pay | Admitting: Pharmacist Clinician (PhC)/ Clinical Pharmacy Specialist

## 2023-12-13 ENCOUNTER — Ambulatory Visit: Admitting: Pharmacist Clinician (PhC)/ Clinical Pharmacy Specialist

## 2023-12-13 ENCOUNTER — Telehealth: Payer: Self-pay | Admitting: Pharmacist Clinician (PhC)/ Clinical Pharmacy Specialist

## 2023-12-13 MED ORDER — REPATHA SURECLICK 140 MG/ML ~~LOC~~ SOAJ: 140.0000 mg | SUBCUTANEOUS | 0 refills | Status: DC

## 2023-12-13 NOTE — Telephone Encounter (Signed)
 Patient was scheduled for OV today.  Had previously started Repatha  in 2024, but when hit coverage gap, became cost prohibitive.  She stopped taking the medication.   She was scheduled today for lipid visit,  but really not needed as she just needs  to get [employer].   She did note that her insurance will change on June 1.  She was working with the front desk to get MyChart set up, so when she gets home, she will try to send the information page on her new plan, and the new card when it arrives.    Will wait until June 1 to determine PA and cost of Repatha  on new plan.  Then will sign her up for [employer] grant and make sure patient back on medication.  Gave sample in the office today, she self injected in the room before leaving.   Will cancel appointment.

## 2023-12-13 NOTE — Progress Notes (Deleted)
 Office Visit    Patient Name: [name] Date of Encounter: 12/13/2023  Primary Care Provider:  Lory Rough., PA-C Primary Cardiologist:  Eilleen Grates, MD  Chief Complaint    Hyperlipidemia - statin intolerant  Past Medical History   Carotid stenosis 9/22 right carotid endarterectomy, patent on most recent scan  CAD Coronary calcium  score 432 (85th percentile)  CVA History of TiA  thyroidectomy On levothyroxine   hypercoagulable Positive for antiphospholipid antibody and lupus anticoagulant syndromes  - on warfarin  Pre-diabetes 6/23 A1c 6.4     Allergies  Allergen Reactions   Bee Venom Anaphylaxis   Hornet Venom Anaphylaxis   Reglan  [Metoclopramide ] Shortness Of Breath and Other (See Comments)    Tongue got numb; didn't feel good   Vitamin K Anaphylaxis and Rash    "kills me" iv   Baclofen     Dizziness   Cephalexin Other (See Comments)   Benadryl [Diphenhydramine Hcl] Rash   Ciprofloxacin Rash   Crestor [Rosuvastatin Calcium ] Other (See Comments)    Muscle Aches   Cymbalta  [Duloxetine  Hcl] Swelling    Swelling of tongue and thrush. Does not sleep and cramps   Daypro [Oxaprozin] Rash   Doxycycline Other (See Comments)    Tongue burning, thrush   Levofloxacin Rash   Sulfonamide Derivatives Rash   Zocor [Simvastatin] Other (See Comments)    Muscle Aches    History of Present Illness    Patient with history of statin intolerance and ASCVD in the office today to discuss options for cholesterol lowering.  She was seen by Dr. Lavonne Prairie last month, referred to the office for symptoms of chest pain.  A coronary calcium  screen showed her to have a calcium  score of 432.  She also has history of carotid endarterectomy in 2022  Insurance Carrier: [employer] Value Plus Plan  $250 deductible, then $144/month  LDL Cholesterol goal:  LDL < 70  Current  Lipid Medications:  none  Intolerances: pravastatin , atorvastatin , rosuvastatin, simvastatin -  myalgias  Family Hx:   mother with 2 stents, died CHF at 35; father died at 54 (froze to death); sister had MI x 2, brother with MI x 2, pacemaker; son and daughter both have bone disease - calcium  buildup)  Social Hx: no tobacco, no alcohol      Diet:  recent mouth surgery, so not able to eat as much recently, mostly home cooked, some eating out to get healthier foods     Accessory Clinical Findings    Lab Results  Component Value Date   ALT 20 09/14/2021   AST 25 09/14/2021   ALKPHOS 73 09/14/2021   BILITOT 0.7 09/14/2021   Lab Results  Component Value Date   CHOL 229 (H) 09/08/2023   HDL 44 09/08/2023   LDLCALC 138 (H) 09/08/2023   TRIG 262 (H) 09/08/2023   CHOLHDL 5.2 (H) 09/08/2023    Lab Results  Component Value Date   HGBA1C 6.3 (H) 06/28/2017    Home Medications    Current Outpatient Medications  Medication Sig Dispense Refill   ALPRAZolam  (XANAX ) 1 MG tablet Take 1 mg by mouth 2 (two) times daily.      Ascorbic Acid (VITAMIN C) 1000 MG tablet Take 1,000 mg by mouth daily.     aspirin  81 MG tablet Take 81 mg by mouth every evening.     benzocaine -menthol  (CHLORAEPTIC) 6-10 MG lozenge Take 1 lozenge by mouth as needed for sore throat. 45 tablet 0   Calcium  Carb-Cholecalciferol (  CALCIUM -VITAMIN D ) 600-400 MG-UNIT TABS Take 1 tablet by mouth daily.     Cyanocobalamin (VITAMIN B 12) 500 MCG TABS Take 500 mcg by mouth daily.     EPINEPHrine  0.3 mg/0.3 mL IJ SOAJ injection Inject 0.3 mg into the muscle as needed for anaphylaxis. 1 each 0   fluticasone  (FLONASE ) 50 MCG/ACT nasal spray Place 2 sprays into the nose daily as needed for allergies.      HYDROcodone -acetaminophen  (NORCO/VICODIN) 5-325 MG tablet Take 1 tablet by mouth every 6 (six) hours as needed for moderate pain. 15 tablet 0   levothyroxine  (SYNTHROID , LEVOTHROID) 112 MCG tablet Take 112 mcg by mouth See admin instructions. Take 112 mcg by mouth daily on Sun, Tues, Wed, Thurs, Sat     levothyroxine   (SYNTHROID , LEVOTHROID) 125 MCG tablet Take 125 mcg by mouth See admin instructions. Take 125 mcg by mouth on Monday and Friday     lidocaine  (XYLOCAINE ) 2 % solution Use as directed 15 mLs in the mouth or throat as needed for mouth pain. 100 mL 0   magnesium  oxide (MAG-OX) 400 MG tablet Take 400 mg by mouth daily.     metoprolol  tartrate (LOPRESSOR ) 100 MG tablet Take 100 mg 2 hours before Coronary CT 1 tablet 0   metoprolol  tartrate (LOPRESSOR ) 25 MG tablet Take 12.5 mg by mouth 2 (two) times daily.     omeprazole (PRILOSEC) 20 MG capsule Take 20 mg by mouth daily.      pravastatin  (PRAVACHOL ) 80 MG tablet Take 80 mg by mouth daily.     sertraline  (ZOLOFT ) 100 MG tablet Take 100 mg by mouth daily.     warfarin (COUMADIN ) 5 MG tablet Take 5 mg daily except on Mondays: take 2.5 mg (1/2 tablet) 90 tablet 3   No current facility-administered medications for this visit.     Assessment & Plan     No problem-specific Assessment & Plan notes found for this encounter.   Donivan Furry, PharmD CPP [facility] 12/13/2023, 6:49 AM

## 2023-12-26 ENCOUNTER — Ambulatory Visit: Admitting: Podiatry

## 2024-01-02 ENCOUNTER — Ambulatory Visit: Admitting: Podiatry

## 2024-01-02 DIAGNOSIS — M79675 Pain in left toe(s): Secondary | ICD-10-CM

## 2024-01-02 DIAGNOSIS — B351 Tinea unguium: Secondary | ICD-10-CM | POA: Diagnosis not present

## 2024-01-02 DIAGNOSIS — M79674 Pain in right toe(s): Secondary | ICD-10-CM | POA: Diagnosis not present

## 2024-01-02 NOTE — Progress Notes (Unsigned)
 Subjective:  Patient ID: [name], female    DOB: 10-Mar-1946,  MRN: [id number]  [name] presents to clinic today for c/o fungal nails.  Notes her 1st and 3rd toenails are most affected.  Patient wants to treat them if they're fungal.  She had nail polish on this morning.    PCP is Lory Rough., PA-C.  Past Medical History:  Diagnosis Date   Anxiety    severe panic attacks   Arthritis    Aseptic meningitis    Asthma    as child   Breast cancer (HCC)    right   Chronic anticoagulation 08/04/2015   Collapsed lung    hx of, left; following left thoracentesis for post-operative pleural effusion > 20 years ago   Complication of anesthesia    hard to put asleep; awareness under anesthesia > 35 years ago for hysterectomy   Depression    Fibromyalgia    GERD (gastroesophageal reflux disease)    H/O hiatal hernia    Headache(784.0)    Hepatitis 1990   from eating at restaurant   History of antiphospholipid antibody syndrome    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   Hx-TIA (transient ischemic attack)    Hyperlipidemia    Hypertension    Hypothyroidism    Neuromuscular disorder (HCC)    Personal history of radiation therapy    Pneumonia    hx of   Soft tissue mass 06/14/2017   3 cm right post axilla same side as previous breast cancer 06/14/17   Stroke (HCC)    Thrombosis, 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   Left digital artery  October 1998 - new dx antiphospholipid antibody syndrome   Past Surgical History:  Procedure Laterality Date   ABDOMINAL HYSTERECTOMY     BREAST LUMPECTOMY Right 2000   BREAST LUMPECTOMY Right 09/15/2021   Procedure: RIGHT BREAST LUMPECTOMY;  Surgeon: Sim Dryer, MD;  Location: [facility];  Service: General;  Laterality: Right;   BREAST SURGERY Right    x3   BUNIONECTOMY Bilateral 30 years ago   CARDIAC CATHETERIZATION  07/12/1997   NORMAL LEFT VENTRICULAR FUNCTION  WITH EF AT LEAST 70-75%   ENDARTERECTOMY Right 04/23/2021   Procedure: RIGHT CAROTID ENDARTERECTOMY;  Surgeon: Adine Hoof, MD;  Location: [facility] OR;  Service: Vascular;  Laterality: Right;   ESOPHAGEAL MANOMETRY N/A 11/06/2012   Procedure: ESOPHAGEAL MANOMETRY (EM);  Surgeon: Azucena Bollard, MD;  Location: [facility];  Service: General;  Laterality: N/A;   ESOPHAGOGASTRODUODENOSCOPY ENDOSCOPY     KNEE ARTHROSCOPY Right    LAPAROSCOPIC NISSEN FUNDOPLICATION N/A 12/12/2012   Procedure: LAPAROSCOPIC TAKEDOWN  PERI-HIATAL HERNIA    REPAIR;  Surgeon: Azucena Bollard, MD;  Location: [facility];  Service: General;  Laterality: N/A;   LEFT OOPHORECTOMY  1982   PATCH ANGIOPLASTY Right 04/23/2021   Procedure: PATCH ANGIOPLASTY;  Surgeon: Adine Hoof, MD;  Location: [facility] OR;  Service: Vascular;  Laterality: Right;   RIGHT OOPHORECTOMY  1980   THYROIDECTOMY  in 20's   TONSILLECTOMY  78 years old   UPPER GI ENDOSCOPY N/A 12/12/2012   Procedure: UPPER GI ENDOSCOPY;  Surgeon: Azucena Bollard, MD;  Location: [facility];  Service: General;  Laterality: N/A;   Allergies  Allergen Reactions   Bee Venom Anaphylaxis   Hornet Venom Anaphylaxis   Reglan  [Metoclopramide ] Shortness Of Breath and Other (See Comments)    Tongue  got numb; didn't feel good   Vitamin K Anaphylaxis and Rash    kills me iv   Baclofen     Dizziness   Cephalexin Other (See Comments)   Benadryl [Diphenhydramine Hcl] Rash   Ciprofloxacin Rash   Crestor [Rosuvastatin Calcium ] Other (See Comments)    Muscle Aches   Cymbalta  [Duloxetine  Hcl] Swelling    Swelling of tongue and thrush. Does not sleep and cramps   Daypro [Oxaprozin] Rash   Doxycycline Other (See Comments)    Tongue burning, thrush   Levofloxacin Rash   Sulfonamide Derivatives Rash   Zocor [Simvastatin] Other (See Comments)    Muscle Aches    Review of Systems: Negative except as noted in the HPI.  Objective:  Vascular Examination: Capillary  refill time is 3-5 seconds to toes bilateral. Palpable pedal pulses b/l LE. Digital hair present b/l.    Dermatological Examination: Pedal skin with normal turgor, texture and tone b/l. No open wounds. No interdigital macerations b/l.  The ten toenails are 3mm thick, discolored, dystrophic with subungual debris. There is pain with compression of the nail plates.    Assessment/Plan: 1. Dermatophytosis of nail   2. Pain due to onychomycosis of toenails of both feet    The mycotic toenails were sharply debrided x10 with sterile nail nippers and a power debriding burr to decrease bulk/thickness and length.    Clippings of the 1st and 3rd toenails were obtained and [facility] fungal nail culture.  Informed patient it may take up to 3 weeks to receive the final report.  Will contact the patient to review the results.  We will discuss treatment plan at that time and follow-up.  Discussed topical, oral, and laser nail treatment options with the patient today.  Will contact in ~3 weeks to review results and discuss treatment options.     Joe Murders, DPM, FACFAS [facility]     [address] [place], [place] [address]                Office [phone]  Fax [phone]

## 2024-01-10 ENCOUNTER — Other Ambulatory Visit: Payer: Self-pay | Admitting: Podiatry

## 2024-01-19 ENCOUNTER — Other Ambulatory Visit (HOSPITAL_COMMUNITY): Payer: Self-pay

## 2024-01-19 ENCOUNTER — Telehealth: Payer: Self-pay | Admitting: Pharmacy Technician

## 2024-01-19 MED ORDER — REPATHA SURECLICK 140 MG/ML ~~LOC~~ SOAJ
140.0000 mg | SUBCUTANEOUS | 3 refills | Status: DC
  Filled 2024-01-19: qty 6, 84d supply, fill #0

## 2024-01-19 NOTE — Telephone Encounter (Signed)
 Patient Advocate Encounter   The patient was approved for a [employer] grant that will help cover the cost of Repatha  Total amount awarded, 2500.  Effective: 12/20/23 - 12/18/24   APW:[id number] ERW:[id number] Hmnle:[id number] PI:[id number] [employer] ID: [id number]   Pharmacy provided with approval and processing information. Patient informed via telephone    I called the patient and asked if she wanted the repatha  at [facility]. Added grant in [place]. She said she will get it there. Sent Kristin a message to have rx sent there

## 2024-01-19 NOTE — Telephone Encounter (Signed)
 Rx sent.

## 2024-01-19 NOTE — Telephone Encounter (Signed)
 New [employer] plan as of June 1.  [employer] Gold ID         [id number] BIN       [id number] PCN     [id number] GRP     [id number]  Please do updated PA for Repatha 

## 2024-01-19 NOTE — Addendum Note (Signed)
 Addended by: Chasitty Hehl L on: 01/19/2024 03:55 PM   Modules accepted: Orders

## 2024-01-20 ENCOUNTER — Other Ambulatory Visit (HOSPITAL_COMMUNITY): Payer: Self-pay

## 2024-01-20 ENCOUNTER — Ambulatory Visit: Payer: Self-pay | Admitting: Podiatry

## 2024-01-20 NOTE — Telephone Encounter (Signed)
 Called patient and made her aware rx is ready for free

## 2024-01-25 ENCOUNTER — Emergency Department (HOSPITAL_COMMUNITY)

## 2024-01-25 ENCOUNTER — Encounter (HOSPITAL_COMMUNITY): Payer: Self-pay

## 2024-01-25 ENCOUNTER — Other Ambulatory Visit: Payer: Self-pay

## 2024-01-25 ENCOUNTER — Emergency Department (HOSPITAL_COMMUNITY): Admission: EM | Admit: 2024-01-25 | Discharge: 2024-01-25 | Disposition: A | Discharge status: Home / Self Care

## 2024-01-25 DIAGNOSIS — Z79899 Other long term (current) drug therapy: Secondary | ICD-10-CM | POA: Insufficient documentation

## 2024-01-25 DIAGNOSIS — Z7901 Long term (current) use of anticoagulants: Secondary | ICD-10-CM | POA: Diagnosis not present

## 2024-01-25 DIAGNOSIS — R0789 Other chest pain: Secondary | ICD-10-CM | POA: Diagnosis not present

## 2024-01-25 DIAGNOSIS — I10 Essential (primary) hypertension: Secondary | ICD-10-CM | POA: Insufficient documentation

## 2024-01-25 DIAGNOSIS — Z853 Personal history of malignant neoplasm of breast: Secondary | ICD-10-CM | POA: Diagnosis not present

## 2024-01-25 DIAGNOSIS — R079 Chest pain, unspecified: Secondary | ICD-10-CM

## 2024-01-25 DIAGNOSIS — Z7982 Long term (current) use of aspirin: Secondary | ICD-10-CM | POA: Diagnosis not present

## 2024-01-25 LAB — BASIC METABOLIC PANEL WITH GFR
Anion gap: 11 (ref 5–15)
BUN: 16 mg/dL (ref 8–23)
CO2: 26 mmol/L (ref 22–32)
Calcium: 8.8 mg/dL — ABNORMAL LOW (ref 8.9–10.3)
Chloride: 98 mmol/L (ref 98–111)
Creatinine, Ser: 1.14 mg/dL — ABNORMAL HIGH (ref 0.44–1.00)
GFR, Estimated: 50 mL/min — ABNORMAL LOW (ref >60)
Glucose, Bld: 174 mg/dL — ABNORMAL HIGH (ref 70–99)
Potassium: 4.5 mmol/L (ref 3.5–5.1)
Sodium: 135 mmol/L (ref 135–145)

## 2024-01-25 LAB — CBC WITH DIFFERENTIAL/PLATELET
Abs Immature Granulocytes: 0.11 10*3/uL — ABNORMAL HIGH (ref 0.00–0.07)
Basophils Absolute: 0.1 10*3/uL (ref 0.0–0.1)
Basophils Relative: 1 %
Eosinophils Absolute: 0.2 10*3/uL (ref 0.0–0.5)
Eosinophils Relative: 2 %
HCT: 40.1 % (ref 36.0–46.0)
Hemoglobin: 13.2 g/dL (ref 12.0–15.0)
Immature Granulocytes: 1 %
Lymphocytes Relative: 33 %
Lymphs Abs: 2.8 10*3/uL (ref 0.7–4.0)
MCH: 31.9 pg (ref 26.0–34.0)
MCHC: 32.9 g/dL (ref 30.0–36.0)
MCV: 96.9 fL (ref 80.0–100.0)
Monocytes Absolute: 0.5 10*3/uL (ref 0.1–1.0)
Monocytes Relative: 6 %
Neutro Abs: 4.8 10*3/uL (ref 1.7–7.7)
Neutrophils Relative %: 57 %
Platelets: 234 10*3/uL (ref 150–400)
RBC: 4.14 MIL/uL (ref 3.87–5.11)
RDW: 13.7 % (ref 11.5–15.5)
WBC: 8.6 10*3/uL (ref 4.0–10.5)
nRBC: 0 % (ref 0.0–0.2)

## 2024-01-25 LAB — TROPONIN I (HIGH SENSITIVITY)
Troponin I (High Sensitivity): 6 ng/L (ref <18)
Troponin I (High Sensitivity): 5 ng/L (ref <18)

## 2024-01-25 MED ORDER — IOHEXOL 350 MG/ML SOLN
100.0000 mL | Freq: Once | INTRAVENOUS | Status: AC | PRN
  Administered 2024-01-25: 100 mL via INTRAVENOUS

## 2024-01-25 MED ORDER — KETOROLAC TROMETHAMINE 15 MG/ML IJ SOLN
15.0000 mg | Freq: Once | INTRAMUSCULAR | Status: AC
Start: 2024-01-25 — End: 2024-01-25
  Administered 2024-01-25: 15 mg via INTRAVENOUS
  Filled 2024-01-25: qty 1

## 2024-01-25 NOTE — ED Triage Notes (Signed)
 Pt to er room number 5 via wc, pt states that she is here for chest pain that started 30 minutes ago when making coffee, pt states that it is a crushing pressure in her chest.  Pt states that nothing seems to make it better or worse.  States that it makes her a little short of breath.

## 2024-01-25 NOTE — ED Provider Notes (Signed)
 [place] EMERGENCY DEPARTMENT AT [facility] Provider Note   CSN: [id number] Arrival date & time: 01/25/24  1209     Patient presents with: Chest Pain   [name] is a 78 y.o. female patient with history of hyperlipidemia, hypertension, reflux who presents to the emergency department today for further evaluation of substernal crushing chest pain that started out of the blue.  This is not exertional.  It is improving currently but still present.  She reports associated shortness of breath when this initially started.  No shortness of breath currently.  She denies any nausea, vomiting, diarrhea, diaphoresis, fever, chills.  No cough.    Chest Pain      Prior to Admission medications   Medication Sig Start Date End Date Taking? Authorizing Provider  ALPRAZolam  (XANAX ) 1 MG tablet Take 1 mg by mouth 2 (two) times daily.  12/22/19   [provider]  Ascorbic Acid (VITAMIN C) 1000 MG tablet Take 1,000 mg by mouth daily.    [provider]  aspirin  81 MG tablet Take 81 mg by mouth every evening.    [provider]  Cyanocobalamin (VITAMIN B 12) 500 MCG TABS Take 500 mcg by mouth daily.    [provider]  EPINEPHrine  0.3 mg/0.3 mL IJ SOAJ injection Inject 0.3 mg into the muscle as needed for anaphylaxis. 02/16/22   Elnor Jayson LABOR, DO  Evolocumab  (REPATHA  SURECLICK) 140 MG/ML SOAJ Inject 140 mg into the skin every 14 (fourteen) days. 01/19/24   Lavona Agent, MD  ezetimibe  (ZETIA ) 10 MG tablet TAKE 1 TABLET BY MOUTH EVERY DAY 10/13/23   Lavona Agent, MD  fluticasone  (FLONASE ) 50 MCG/ACT nasal spray Place 2 sprays into the nose daily as needed for allergies.  10/16/14   [provider]  HYDROcodone -acetaminophen  (NORCO/VICODIN) 5-325 MG tablet Take 1 tablet by mouth every 6 (six) hours as needed for moderate pain. 09/15/21   Cornett, Debby, MD  levothyroxine  (SYNTHROID , LEVOTHROID) 112 MCG tablet Take 112 mcg by mouth See admin  instructions. Take 112 mcg by mouth daily on Sun, Tues, Wed, Thurs, Sat    [provider]  levothyroxine  (SYNTHROID , LEVOTHROID) 125 MCG tablet Take 125 mcg by mouth See admin instructions. Take 125 mcg by mouth on Monday and Friday 06/24/16   [provider]  magnesium  oxide (MAG-OX) 400 MG tablet Take 400 mg by mouth daily.    [provider]  metoprolol  tartrate (LOPRESSOR ) 25 MG tablet TAKE 0.5 TABLETS BY MOUTH 2 TIMES DAILY. 11/23/23   Lavona Agent, MD  omeprazole (PRILOSEC) 20 MG capsule Take 20 mg by mouth daily.  02/06/13   [provider]  warfarin (COUMADIN ) 5 MG tablet Take 5 mg daily except on Mondays: take 2.5 mg (1/2 tablet) 12/29/21   Odean Potts, MD    Allergies: Bee venom, Hornet venom, Reglan  [metoclopramide ], Vitamin k, Baclofen, Cephalexin, Benadryl [diphenhydramine hcl], Ciprofloxacin, Crestor [rosuvastatin calcium ], Cymbalta  [duloxetine  hcl], Daypro [oxaprozin], Doxycycline, Levofloxacin, Sulfonamide derivatives, and Zocor [simvastatin]    Review of Systems  Cardiovascular:  Positive for chest pain.  All other systems reviewed and are negative.   Updated Vital Signs BP 134/82   Pulse (!) 55   Temp 97.8 F (36.6 C)   Resp 11   Ht 5' 5 (1.651 m)   Wt 81.6 kg   SpO2 99%   BMI 29.95 kg/m   Physical Exam Vitals and nursing note reviewed.  Constitutional:      General: She is not in  acute distress.    Appearance: Normal appearance.  HENT:     Head: Normocephalic and atraumatic.  Eyes:     General:        Right eye: No discharge.        Left eye: No discharge.  Cardiovascular:     Comments: Regular rate and rhythm.  S1/S2 are distinct without any evidence of murmur, rubs, or gallops.  Radial pulses are 2+ bilaterally.  Dorsalis pedis pulses are 2+ bilaterally.  No evidence of pedal edema. Pulmonary:     Comments: Clear to auscultation bilaterally.  Normal effort.  No respiratory distress.  No evidence of wheezes, rales, or  rhonchi heard throughout. Abdominal:     General: Abdomen is flat. Bowel sounds are normal. There is no distension.     Tenderness: There is no abdominal tenderness. There is no guarding or rebound.  Musculoskeletal:        General: Normal range of motion.     Cervical back: Neck supple.  Skin:    General: Skin is warm and dry.     Findings: No rash.  Neurological:     General: No focal deficit present.     Mental Status: She is alert.  Psychiatric:        Mood and Affect: Mood normal.        Behavior: Behavior normal.     (all labs ordered are listed, but only abnormal results are displayed) Labs Reviewed  CBC WITH DIFFERENTIAL/PLATELET - Abnormal; Notable for the following components:      Result Value   Abs Immature Granulocytes 0.11 (*)    All other components within normal limits  BASIC METABOLIC PANEL WITH GFR - Abnormal; Notable for the following components:   Glucose, Bld 174 (*)    Creatinine, Ser 1.14 (*)    Calcium  8.8 (*)    GFR, Estimated 50 (*)    All other components within normal limits  TROPONIN I (HIGH SENSITIVITY)  TROPONIN I (HIGH SENSITIVITY)    EKG: EKG Interpretation Date/Time:  Wednesday January 25 2024 12:14:31 EDT Ventricular Rate:  56 PR Interval:  211 QRS Duration:  98 QT Interval:  449 QTC Calculation: 434 R Axis:   24  Text Interpretation: Sinus rhythm Low voltage, precordial leads Confirmed by Simon Rea [phone]) on 01/25/2024 1:56:27 PM  Radiology: CT Angio Chest/Abd/Pel for Dissection W and/or Wo Contrast Result Date: 01/25/2024 CLINICAL DATA:  Chest pain. Evaluate for dissection. History of right-sided breast cancer. EXAM: CT ANGIOGRAPHY CHEST, ABDOMEN AND PELVIS TECHNIQUE: Non-contrast CT of the chest was initially obtained. Multidetector CT imaging through the chest, abdomen and pelvis was performed using the standard protocol during bolus administration of intravenous contrast. Multiplanar reconstructed images and MIPs were obtained and  reviewed to evaluate the vascular anatomy. RADIATION DOSE REDUCTION: This exam was performed according to the departmental dose-optimization program which includes automated exposure control, adjustment of the mA and/or kV according to patient size and/or use of iterative reconstruction technique. CONTRAST:  OMNIPAQUE  IOHEXOL  350 MG/ML SOLN COMPARISON:  Plain film chest earlier today. Chest CT 10/13/2022. Most recent abdominopelvic CT of 02/21/2020. FINDINGS: CTA CHEST FINDINGS Cardiovascular:  No intramural hematoma on noncontrast imaging. No thoracic aortic dissection or aneurysm. Aortic atherosclerosis. Normal caliber of the great vessels. Mild cardiomegaly, without pericardial effusion. Multivessel coronary artery atherosclerosis. No central pulmonary embolism, on this non-dedicated study. Mediastinum/Nodes: No axillary adenopathy. No mediastinal adenopathy. No hilar adenopathy. Small hiatal hernia. Lungs/Pleura: No pleural fluid. Minimal subpleural anterior right  upper lobe radiation fibrosis. Soft tissue densities of up to 4 mm along the right upper lobe segmental to subsegmental bronchi including on 46/7 are similar on 10/13/2022 and likely represent lymphoid tissue. A subpleural left lower lobe 3 mm nodule on 92/7 is not readily apparent on the prior. 2-3 mm new nodule along the right minor fissure on 62/7 is similar on the prior. Superior segment left lower lobe 5 mm nodule on 56/7 is unchanged. Presumed subpleural lymph node along the left major fissure including on 67/7. Musculoskeletal: Included within the abdomen pelvic section. Review of the MIP images confirms the above findings. CTA ABDOMEN AND PELVIS FINDINGS VASCULAR Aorta: Normal abdominal aortic caliber, without dissection. Moderate atherosclerosis. Celiac: Moderate stenosis at the origin with mild poststenotic dilatation. Example 133/6. SMA: Widely patent. Renals: Diminutive right renal artery without significant stenosis. Patent left  renal artery, without significant narrowing. IMA: Patent. Inflow: Normal caliber of the pelvic vasculature. Veins: Poorly evaluated secondary to bolus timing. Review of the MIP images confirms the above findings. NON-VASCULAR Hepatobiliary: Segment 2 subcentimeter hepatic cyst. Normal gallbladder, without biliary ductal dilatation. Pancreas: Normal, without mass or ductal dilatation. Spleen: Surgical clips in the splenic hilum. Lobular splenic morphology is likely postoperative and unchanged. Adrenals/Urinary Tract: Normal adrenal glands. Mild renal cortical thinning bilaterally. No hydronephrosis. Normal urinary bladder. Stomach/Bowel: Proximal gastric underdistention. Apparent wall thickening is most likely secondary. Normal terminal ileum. Normal small bowel. Lymphatic: No abdominopelvic adenopathy. Reproductive: Hysterectomy.  No adnexal mass. Other: No significant free fluid. Mild pelvic floor laxity. No free intraperitoneal air. No evidence of omental or peritoneal disease. Musculoskeletal: Medial right breast soft tissue fullness and architectural distortion on 47/6 are presumably postoperative/treatment related. Remote left eighth posterior rib fracture. Review of the MIP images confirms the above findings. IMPRESSION: 1. No aortic dissection or aneurysm. No explanation for patient's symptoms. 2. Aortic Atherosclerosis (ICD10-I70.0). Moderate celiac narrowing with poststenotic dilatation, possibly due to median arcuate ligament impression. SMA and IMA both widely patent. 3. No evidence of metastatic disease. 4. Small hiatal hernia. Apparent proximal gastric wall thickening is most likely due to underdistention. Correlate with symptoms of gastritis. Electronically Signed   By: Rockey Kilts M.D.   On: 01/25/2024 16:06   DG Chest 2 View Result Date: 01/25/2024 CLINICAL DATA:  Chest pain EXAM: CHEST - 2 VIEW COMPARISON:  Chest x-ray 05/11/2022.  CT scan 10/13/2022 FINDINGS: Underinflation. No consolidation,  pneumothorax or effusion. No edema. Normal cardiopericardial silhouette. Calcified aorta. Overlapping cardiac leads. Degenerative changes along the spine and shoulders. Film is under penetrated. IMPRESSION: Underinflation.  No acute cardiopulmonary disease. Electronically Signed   By: Ranell Bring M.D.   On: 01/25/2024 13:19     Procedures   Medications Ordered in the ED  ketorolac  (TORADOL ) 15 MG/ML injection 15 mg (15 mg Intravenous Given 01/25/24 1246)  iohexol  (OMNIPAQUE ) 350 MG/ML injection 100 mL (100 mLs Intravenous Contrast Given 01/25/24 1525)    Clinical Course as of 01/25/24 1644  Wed Jan 25, 2024  1353 CBC with Differential(!) Negative.  [CF]  1353 Basic metabolic panel(!) No electrolyte abnormalities.  Creatinine at baseline actually improved. [CF]  1353 Troponin I (High Sensitivity) Initial troponin is normal.  [CF]  1353 DG Chest 2 View I personally ordered interpreted the study.  Do not see any evidence of pneumothorax or pneumonia.  I do agree with the radiologist interpretation. [CF]  1639 On repeat evaluation, patient states she is feeling much better.  Patient willing to go home.  Strict turn  precaution were discussed.  She is safe for discharge. [CF]  1642 CT Angio Chest/Abd/Pel for Dissection W and/or Wo Contrast Personally ordered interpreted the study.  This is negative.  I do agree with the radiologist interpretation. [CF]  1642 Troponin I (High Sensitivity) Delta troponin is negative. [CF]    Clinical Course User Index [CF] Theotis Cameron HERO, PA-C    Medical Decision Making Eleesha Purkey is a 78 y.o. female patient who presents to the emergency department today for further evaluation of substernal chest pain.  Patient is slightly hypertensive here.  Vitals are otherwise normal.  No neurological findings.  Patient is oxygenating well on room air.  Offered patient pain medication which she declined.  Will work her up for ACS.  Low suspicion for PE at this  time but if everything is normal we will consider D-dimer later.  Went ahead and did a dissection study given the patient's crushing chest pain and mild hypertension.  This was negative.  Troponins were negative x 2.  Patient is overall feeling better.  Will have her follow-up with her primary care doctor.  Strict return precautions were discussed.  She is safe for discharge.   Amount and/or Complexity of Data Reviewed Labs: ordered. Decision-making details documented in ED Course. Radiology: ordered. Decision-making details documented in ED Course.  Risk Prescription drug management.    Final diagnoses:  Chest pain, unspecified type    ED Discharge Orders     None          Theotis Cameron [place], [place] 01/25/24 1644    Simon Lavonia SAILOR, MD 01/26/24 [phone]

## 2024-01-25 NOTE — Discharge Instructions (Signed)
 Would like for you to follow-up with your primary care doctor and/or cardiologist if you have one.  You can return to the emergency department for any worsening symptoms.  As we discussed, your workup today was ultimately negative which is great news.

## 2024-01-25 NOTE — ED Notes (Signed)
 The pt reports that she just returned from some type test and that she is freezing  and it is cold in the room  3 warm blankets placed on the pt female sitting beside her

## 2024-02-06 ENCOUNTER — Other Ambulatory Visit: Payer: Self-pay | Admitting: Family Medicine

## 2024-02-06 DIAGNOSIS — Z1231 Encounter for screening mammogram for malignant neoplasm of breast: Secondary | ICD-10-CM

## 2024-02-20 ENCOUNTER — Telehealth: Payer: Self-pay | Admitting: Podiatry

## 2024-02-20 NOTE — Telephone Encounter (Signed)
 Patient takes blood thinners. She asks what is the name of the pill for oral fungus therapy -to make sure it would be okay to take it.

## 2024-02-24 ENCOUNTER — Other Ambulatory Visit: Payer: Self-pay | Admitting: Cardiology

## 2024-02-24 DIAGNOSIS — E785 Hyperlipidemia, unspecified: Secondary | ICD-10-CM

## 2024-03-06 ENCOUNTER — Other Ambulatory Visit: Payer: Self-pay | Admitting: Cardiology

## 2024-03-06 DIAGNOSIS — E785 Hyperlipidemia, unspecified: Secondary | ICD-10-CM

## 2024-03-08 ENCOUNTER — Ambulatory Visit

## 2024-03-13 ENCOUNTER — Other Ambulatory Visit: Payer: Self-pay | Admitting: Cardiology

## 2024-03-13 DIAGNOSIS — I6529 Occlusion and stenosis of unspecified carotid artery: Secondary | ICD-10-CM

## 2024-03-13 DIAGNOSIS — E785 Hyperlipidemia, unspecified: Secondary | ICD-10-CM

## 2024-03-13 NOTE — Telephone Encounter (Signed)
 Please call to schedule yearly OV with Dr Lavona or APP

## 2024-03-20 ENCOUNTER — Other Ambulatory Visit: Payer: Self-pay | Admitting: Cardiology

## 2024-03-20 ENCOUNTER — Other Ambulatory Visit (HOSPITAL_BASED_OUTPATIENT_CLINIC_OR_DEPARTMENT_OTHER): Payer: Self-pay

## 2024-03-20 DIAGNOSIS — I10 Essential (primary) hypertension: Secondary | ICD-10-CM

## 2024-03-22 ENCOUNTER — Telehealth: Payer: Self-pay | Admitting: Pharmacist

## 2024-03-22 NOTE — Telephone Encounter (Signed)
 Called [facility] and gave verbal for Repatha  replacement.

## 2024-04-02 ENCOUNTER — Encounter: Payer: Self-pay | Admitting: Podiatry

## 2024-04-02 ENCOUNTER — Ambulatory Visit (INDEPENDENT_AMBULATORY_CARE_PROVIDER_SITE_OTHER): Admitting: Podiatry

## 2024-04-02 DIAGNOSIS — M79674 Pain in right toe(s): Secondary | ICD-10-CM

## 2024-04-02 DIAGNOSIS — M79675 Pain in left toe(s): Secondary | ICD-10-CM | POA: Diagnosis not present

## 2024-04-02 DIAGNOSIS — B351 Tinea unguium: Secondary | ICD-10-CM

## 2024-04-02 MED ORDER — CICLOPIROX 8 % EX SOLN: Freq: Every day | CUTANEOUS | 11 refills | Status: AC

## 2024-04-02 NOTE — Progress Notes (Unsigned)
 Subjective:  Patient ID: [name], female    DOB: 06/14/1946,  MRN: [id number]  [name] presents to clinic today for:  Chief Complaint  Patient presents with   Nail Problem    Fungal nail recheck.  Pt did not start any medication.  Did not remember talking with [name].  Diabetic A1c 7.2. Warfarin.   Patient notes nails are thick, discolored, elongated and painful in shoegear when trying to ambulate.  Patient states that she does not recall having conversation with our nurse triage team member reviewing the fungal nail culture results and discussing the treatment options.  She like to discuss this today.  Therefore she has not been treating the fungal nails since last seen.  PCP is [name] ORN., PA-C.  Past Medical History:  Diagnosis Date   Anxiety    severe panic attacks   Arthritis    Aseptic meningitis    Asthma    as child   Breast cancer (HCC)    right   Chronic anticoagulation 08/04/2015   Collapsed lung    hx of, left; following left thoracentesis for post-operative pleural effusion > 20 years ago   Complication of anesthesia    hard to put asleep; awareness under anesthesia > 35 years ago for hysterectomy   Depression    Fibromyalgia    GERD (gastroesophageal reflux disease)    H/O hiatal hernia    Headache(784.0)    Hepatitis 1990   from eating at restaurant   History of antiphospholipid antibody syndrome    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   Hx-TIA (transient ischemic attack)    Hyperlipidemia    Hypertension    Hypothyroidism    Neuromuscular disorder (HCC)    Personal history of radiation therapy    Pneumonia    hx of   Soft tissue mass 06/14/2017   3 cm right post axilla same side as previous breast cancer 06/14/17   Stroke (HCC)    Thrombosis, 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   Left digital artery  October 1998 - new dx antiphospholipid antibody syndrome   Past Surgical History:   Procedure Laterality Date   ABDOMINAL HYSTERECTOMY     BREAST LUMPECTOMY Right 2000   BREAST LUMPECTOMY Right 09/15/2021   Procedure: RIGHT BREAST LUMPECTOMY;  Surgeon: [name] Ned, MD;  Location: [facility];  Service: General;  Laterality: Right;   BREAST SURGERY Right    x3   BUNIONECTOMY Bilateral 30 years ago   CARDIAC CATHETERIZATION  07/12/1997   NORMAL LEFT VENTRICULAR FUNCTION WITH EF AT LEAST 70-75%   ENDARTERECTOMY Right 04/23/2021   Procedure: RIGHT CAROTID ENDARTERECTOMY;  Surgeon: [name] Penne Bruckner, MD;  Location: [facility];  Service: Vascular;  Laterality: Right;   ESOPHAGEAL MANOMETRY N/A 11/06/2012   Procedure: ESOPHAGEAL MANOMETRY (EM);  Surgeon: [name] [name] Lunger, MD;  Location: [facility];  Service: General;  Laterality: N/A;   ESOPHAGOGASTRODUODENOSCOPY ENDOSCOPY     KNEE ARTHROSCOPY Right    LAPAROSCOPIC NISSEN FUNDOPLICATION N/A 12/12/2012   Procedure: LAPAROSCOPIC TAKEDOWN  PERI-HIATAL HERNIA    REPAIR;  Surgeon: [name] [name] Lunger, MD;  Location: [facility];  Service: General;  Laterality: N/A;   LEFT OOPHORECTOMY  1982   PATCH ANGIOPLASTY Right 04/23/2021   Procedure: PATCH ANGIOPLASTY;  Surgeon: [name] Penne Bruckner, MD;  Location: [facility];  Service: Vascular;  Laterality: Right;   RIGHT OOPHORECTOMY  1980   THYROIDECTOMY  in 20's  TONSILLECTOMY  78 years old   UPPER GI ENDOSCOPY N/A 12/12/2012   Procedure: UPPER GI ENDOSCOPY;  Surgeon: [name] [name] Lunger, MD;  Location: [facility];  Service: General;  Laterality: N/A;   Allergies  Allergen Reactions   Bee Venom Anaphylaxis   Hornet Venom Anaphylaxis   Reglan  [Metoclopramide ] Shortness Of Breath and Other (See Comments)    Tongue got numb; didn't feel good   Vitamin K Anaphylaxis and Rash    kills me iv   Baclofen     Dizziness   Cephalexin Other (See Comments)   Benadryl [Diphenhydramine Hcl] Rash   Ciprofloxacin Rash   Crestor [Rosuvastatin Calcium ] Other (See Comments)    Muscle  Aches   Cymbalta  [Duloxetine  Hcl] Swelling    Swelling of tongue and thrush. Does not sleep and cramps   Daypro [Oxaprozin] Rash   Doxycycline Other (See Comments)    Tongue burning, thrush   Levofloxacin Rash   Sulfonamide Derivatives Rash   Zocor [Simvastatin] Other (See Comments)    Muscle Aches    Review of Systems: Negative except as noted in the HPI.  Objective:  [name] is a pleasant 78 y.o. female in NAD. AAO x 3.  Vascular Examination: Capillary refill time is 3-5 seconds to toes bilateral. Palpable pedal pulses b/l LE. Digital hair present b/l.  Skin temperature gradient WNL b/l. No varicosities b/l. No cyanosis noted b/l.   Dermatological Examination: Pedal skin with normal turgor, texture and tone b/l. No open wounds. No interdigital macerations b/l. Toenails x10 are 3mm thick, discolored, dystrophic with subungual debris. There is pain with compression of the nail plates.  They are elongated x10    Assessment/Plan: 1. Pain due to onychomycosis of toenails of both feet     Meds ordered this encounter  Medications   ciclopirox  (PENLAC ) 8 % solution    Sig: Apply topically at bedtime. Apply thin layer over nail. Apply daily over previous coat. Remove weekly with polish remover.    Dispense:  6.6 mL    Refill:  11   The mycotic toenails were sharply debrided x10 with sterile nail nippers and a power debriding burr to decrease bulk/thickness and length.    Reviewed the fungal nail culture results with the patient again today.  She is not sure she wants to deal with the risks involved with oral medication show she would like to go ahead and have the topical prescription medication sent to her pharmacy.  Will go ahead and send in ciclopirox  for her to apply once daily to the toenails.  Return in about 3 months (around 07/02/2024) for fungal nail recheck.   [name], DPM, FACFAS [facility]     [address] [place], [place] [address]                Office [phone]  Fax [phone]

## 2024-04-05 ENCOUNTER — Other Ambulatory Visit: Payer: Self-pay | Admitting: Cardiology

## 2024-04-05 DIAGNOSIS — I10 Essential (primary) hypertension: Secondary | ICD-10-CM

## 2024-04-13 ENCOUNTER — Other Ambulatory Visit (HOSPITAL_COMMUNITY): Payer: Self-pay

## 2024-04-13 ENCOUNTER — Telehealth: Payer: Self-pay | Admitting: Pharmacist Clinician (PhC)/ Clinical Pharmacy Specialist

## 2024-04-13 ENCOUNTER — Telehealth: Payer: Self-pay | Admitting: Pharmacy Technician

## 2024-04-13 MED ORDER — REPATHA SURECLICK 140 MG/ML ~~LOC~~ SOAJ
140.0000 mg | SUBCUTANEOUS | 0 refills | Status: DC
Start: 2024-04-13 — End: 2024-04-26

## 2024-04-13 NOTE — Telephone Encounter (Signed)
   Pharmacy Patient Advocate Encounter   Received notification from Pt Calls Messages that prior authorization for REPATHA  is required/requested.   Insurance verification completed.   The patient is insured through [facility] .   Per test claim: PA required; PA submitted to above mentioned insurance via Latent Key/confirmation #/E[facility] Status is pending

## 2024-04-13 NOTE — Telephone Encounter (Signed)
 Patient has new insurance, as of Sept 1 2025 - [employer]   Please do updated PA for Repatha   Gave sample x 1 as next dose due today.

## 2024-04-13 NOTE — Telephone Encounter (Signed)
 Pharmacy Patient Advocate Encounter  Received notification from [employer] that Prior Authorization for repatha  has been APPROVED from 04/13/24 to 10/11/24   PA #/Case ID/Reference #: [id number]

## 2024-04-16 NOTE — Telephone Encounter (Signed)
 LM for patient that Repatha  approved under new insurance.

## 2024-04-16 NOTE — Telephone Encounter (Signed)
 See other encounter

## 2024-04-25 ENCOUNTER — Other Ambulatory Visit (HOSPITAL_BASED_OUTPATIENT_CLINIC_OR_DEPARTMENT_OTHER): Payer: Self-pay

## 2024-04-26 ENCOUNTER — Other Ambulatory Visit: Payer: Self-pay | Admitting: Pharmacist

## 2024-04-26 ENCOUNTER — Other Ambulatory Visit (HOSPITAL_BASED_OUTPATIENT_CLINIC_OR_DEPARTMENT_OTHER): Payer: Self-pay

## 2024-04-26 ENCOUNTER — Other Ambulatory Visit: Payer: Self-pay

## 2024-04-26 DIAGNOSIS — I6529 Occlusion and stenosis of unspecified carotid artery: Secondary | ICD-10-CM

## 2024-04-26 DIAGNOSIS — E785 Hyperlipidemia, unspecified: Secondary | ICD-10-CM

## 2024-04-26 MED ORDER — REPATHA SURECLICK 140 MG/ML ~~LOC~~ SOAJ
1.0000 mL | SUBCUTANEOUS | 1 refills | Status: DC
  Filled 2024-04-26: qty 6, 84d supply, fill #0

## 2024-05-10 ENCOUNTER — Other Ambulatory Visit: Payer: Self-pay | Admitting: Cardiology

## 2024-05-10 DIAGNOSIS — I10 Essential (primary) hypertension: Secondary | ICD-10-CM

## 2024-05-16 ENCOUNTER — Ambulatory Visit: Admitting: Orthopedic Surgery

## 2024-05-28 ENCOUNTER — Encounter: Payer: Self-pay | Admitting: Radiology

## 2024-06-25 ENCOUNTER — Telehealth: Payer: Self-pay | Admitting: Cardiology

## 2024-06-25 NOTE — Telephone Encounter (Signed)
 Spoke with patient -- she was calling to ask if a LIPID panel needed to be checked.   Reviewed chart. PCP did labs 02/2024. Will route to MD to review.   Advised patient will only call back if additional labs are needed  Keep scheduled OV 07/2024 with MD

## 2024-06-25 NOTE — Telephone Encounter (Signed)
 Pt requesting a order to be placed to have her INR checked.

## 2024-06-26 NOTE — Telephone Encounter (Signed)
 Pt notified that no labs need to be done prior to her appointment in Jan. Pt verbalized understanding. All questions if any were answered.

## 2024-07-23 ENCOUNTER — Emergency Department (HOSPITAL_COMMUNITY)

## 2024-07-23 ENCOUNTER — Encounter (HOSPITAL_COMMUNITY): Payer: Self-pay

## 2024-07-23 ENCOUNTER — Other Ambulatory Visit: Payer: Self-pay

## 2024-07-23 ENCOUNTER — Emergency Department (HOSPITAL_COMMUNITY)
Admission: EM | Admit: 2024-07-23 | Discharge: 2024-07-24 | Attending: Emergency Medicine | Admitting: Emergency Medicine

## 2024-07-23 DIAGNOSIS — Z5321 Procedure and treatment not carried out due to patient leaving prior to being seen by health care provider: Secondary | ICD-10-CM | POA: Insufficient documentation

## 2024-07-23 DIAGNOSIS — M542 Cervicalgia: Secondary | ICD-10-CM | POA: Insufficient documentation

## 2024-07-23 DIAGNOSIS — R079 Chest pain, unspecified: Secondary | ICD-10-CM | POA: Insufficient documentation

## 2024-07-23 LAB — BASIC METABOLIC PANEL WITH GFR
Anion gap: 11 (ref 5–15)
BUN: 17 mg/dL (ref 8–23)
CO2: 25 mmol/L (ref 22–32)
Calcium: 9.5 mg/dL (ref 8.9–10.3)
Chloride: 102 mmol/L (ref 98–111)
Creatinine, Ser: 0.98 mg/dL (ref 0.44–1.00)
GFR, Estimated: 59 mL/min — ABNORMAL LOW
Glucose, Bld: 110 mg/dL — ABNORMAL HIGH (ref 70–99)
Potassium: 4.6 mmol/L (ref 3.5–5.1)
Sodium: 138 mmol/L (ref 135–145)

## 2024-07-23 LAB — CBC
HCT: 40.3 % (ref 36.0–46.0)
Hemoglobin: 13.5 g/dL (ref 12.0–15.0)
MCH: 31.7 pg (ref 26.0–34.0)
MCHC: 33.5 g/dL (ref 30.0–36.0)
MCV: 94.6 fL (ref 80.0–100.0)
Platelets: 260 K/uL (ref 150–400)
RBC: 4.26 MIL/uL (ref 3.87–5.11)
RDW: 13.5 % (ref 11.5–15.5)
WBC: 9.5 K/uL (ref 4.0–10.5)
nRBC: 0 % (ref 0.0–0.2)

## 2024-07-23 LAB — PROTIME-INR
INR: 2.7 — ABNORMAL HIGH (ref 0.8–1.2)
Prothrombin Time: 29.5 s — ABNORMAL HIGH (ref 11.4–15.2)

## 2024-07-23 LAB — TROPONIN T, HIGH SENSITIVITY: Troponin T High Sensitivity: <15 ng/L (ref 0–19)

## 2024-07-23 MED ORDER — OXYCODONE-ACETAMINOPHEN 5-325 MG PO TABS
1.0000 | ORAL_TABLET | Freq: Once | ORAL | Status: AC
  Administered 2024-07-23: 1 via ORAL
  Filled 2024-07-23: qty 1

## 2024-07-23 MED ORDER — IOHEXOL 350 MG/ML SOLN
75.0000 mL | Freq: Once | INTRAVENOUS | Status: AC | PRN
  Administered 2024-07-23: 75 mL via INTRAVENOUS

## 2024-07-23 NOTE — ED Provider Triage Note (Signed)
 Emergency Medicine Provider Triage Evaluation Note  [name] , a 78 y.o. female  was evaluated in triage.  Pt complains of neck and chest pain.  Patient states that the pain started about 1 hour ago and has been constant since onset.  Patient states that pain is notably to the left side of her neck and travels down her left arm.  Patient states that she had something similar in the past when she had a blocked carotid artery.  Patient denies any nausea, vomiting, or diarrhea.  Patient denies any neurological symptoms such as numbness, tingling, weakness.  Patient is in no acute distress.  Review of Systems  Positive: CP, SOB Negative: Fever,  Physical Exam  BP (!) 150/105   Pulse 87   Resp 18   Ht 5' 5 (1.651 m)   Wt 81.2 kg   SpO2 98%   BMI 29.79 kg/m  Gen:   Awake, no distress   Resp:  Normal effort  MSK:   Moves extremities without difficulty  Other:   Medical Decision Making  Medically screening exam initiated at 6:57 PM.  Appropriate orders placed.  [name] was informed that the remainder of the evaluation will be completed by another provider, this initial triage assessment does not replace that evaluation, and the importance of remaining in the ED until their evaluation is complete.     [name], [name] 07/23/24 1900

## 2024-07-23 NOTE — ED Triage Notes (Signed)
 Left sided neck pain/throat pain that radiates into left shoulder and arm that started about an hour ago. Pt states she had an issue in the past with this and she had a blocked carotid artery. Pt is speaking in full sentences in triage, respirations equal and unlabored. O2 sat is 99% on RA.

## 2024-07-24 ENCOUNTER — Other Ambulatory Visit: Payer: Self-pay | Admitting: Cardiology

## 2024-07-24 DIAGNOSIS — I10 Essential (primary) hypertension: Secondary | ICD-10-CM

## 2024-07-24 NOTE — ED Notes (Signed)
 Patient states she is leaving and will follow up with her primary car doctor

## 2024-08-01 ENCOUNTER — Other Ambulatory Visit (HOSPITAL_COMMUNITY): Payer: Self-pay

## 2024-08-01 ENCOUNTER — Telehealth: Payer: Self-pay | Admitting: Pharmacy Technician

## 2024-08-01 ENCOUNTER — Telehealth: Payer: Self-pay | Admitting: Pharmacist Clinician (PhC)/ Clinical Pharmacy Specialist

## 2024-08-01 DIAGNOSIS — I6529 Occlusion and stenosis of unspecified carotid artery: Secondary | ICD-10-CM

## 2024-08-01 DIAGNOSIS — E785 Hyperlipidemia, unspecified: Secondary | ICD-10-CM

## 2024-08-01 NOTE — Telephone Encounter (Signed)
 Patient sent copy of new [facility] Medicare card.  Please do updated PA for Repatha .

## 2024-08-01 NOTE — Telephone Encounter (Signed)
 Ran test claim for repatha. For a 28 day supply and the co-pay is 0.00 . PA is not needed at this time.   This test claim was processed through [facility]- copay amounts may vary at other pharmacies due to pharmacy/plan contracts, or as the patient moves through the different stages of their insurance plan.

## 2024-08-03 ENCOUNTER — Other Ambulatory Visit (HOSPITAL_COMMUNITY): Payer: Self-pay

## 2024-08-03 MED ORDER — REPATHA SURECLICK 140 MG/ML ~~LOC~~ SOAJ
1.0000 mL | SUBCUTANEOUS | 3 refills | Status: AC
  Filled 2024-08-03: qty 6, 84d supply, fill #0

## 2024-08-03 NOTE — Addendum Note (Signed)
 Addended by: Sukhdeep Wieting L on: 08/03/2024 04:52 PM   Modules accepted: Orders

## 2024-08-11 NOTE — Progress Notes (Unsigned)
" °  Cardiology Office Note:   Date:  08/11/2024  ID:  [name], [name] 13-Oct-1945, MRN [id number] PCP: Debrah Josette MOHR PA-C  [facility] Providers Cardiologist:  Lynwood Schilling, MD {  History of Present Illness:   [name] is a 79 y.o. female who presents for with a hx of normal coronaries by heart cath in 1998 with nonischemic stress tests in 2012 and Jan 2019. In 04/2020 she complained of chest pain. Follow up ETT was negative for ischemia. She was in the ED in October 2023 for chest pain.  She had a coronary CTA that demonstrated 25 to 49% LAD stenosis.  OM1 had 25 to 49% stenosis.  She was in the ED in July 2025 and I reviewed these records for this visit.  She had chest pain and negative CT for PE.  There was no evidence of ischemia.  ***   *** She has had no new cardiovascular complaints.  She denies chest pressure.  She has some twinges of neck discomfort occasionally.  She has had no new shortness of breath, PND or orthopnea.  She has no palpitations, presyncope or syncope.  She has had no weight gain or edema.  She does have some neuropathy in her legs that makes exercise a little more problematic.   ROS: ***  Studies Reviewed:    EKG:       ***  Risk Assessment/Calculations:   {Does this patient have ATRIAL FIBRILLATION?:[phone]} No BP recorded.  {Refresh Note OR Click here to enter BP  :1}***        Physical Exam:   VS:  There were no vitals taken for this visit.   Wt Readings from Last 3 Encounters:  07/23/24 179 lb (81.2 kg)  01/25/24 180 lb (81.6 kg)  12/02/22 186 lb 12.8 oz (84.7 kg)     GEN: Well nourished, well developed in no acute distress NECK: No JVD; No carotid bruits CARDIAC: ***RR, *** murmurs, rubs, gallops RESPIRATORY:  Clear to auscultation without rales, wheezing or rhonchi  ABDOMEN: Soft, non-tender, non-distended EXTREMITIES:  No edema; No deformity   ASSESSMENT AND PLAN:   CAD: ***   Patient has nonobstructive  coronary disease and she is dissipating aggressively and risk reduction.  No change in therapy.   HTN: Her blood pressure is ***  well-controlled.  She will continue the meds as listed.   Dyslipidemia: LDL is ***  79 which was down from 134.  She is not taking a PCSK9.  The she is not quite at target and I will check this again in September.  If her LDL is not into the 50s I will add Zetia .   Carotid stenosis: ***  She is up-to-date with follow-up.  No change in therapy.  She had nonobstructive disease on her last Doppler   Chronic anticoagulation: ***  She has antiphospholipid syndrome with previous history of DVTs and is on chronic anticoagulation.     Follow up ***  Signed, Lynwood Schilling, MD   "

## 2024-08-13 ENCOUNTER — Ambulatory Visit: Admitting: Cardiology

## 2024-08-13 DIAGNOSIS — E785 Hyperlipidemia, unspecified: Secondary | ICD-10-CM

## 2024-08-13 DIAGNOSIS — I2510 Atherosclerotic heart disease of native coronary artery without angina pectoris: Secondary | ICD-10-CM

## 2024-08-13 DIAGNOSIS — I6529 Occlusion and stenosis of unspecified carotid artery: Secondary | ICD-10-CM

## 2024-08-25 ENCOUNTER — Other Ambulatory Visit: Payer: Self-pay | Admitting: Cardiology

## 2024-08-25 DIAGNOSIS — E785 Hyperlipidemia, unspecified: Secondary | ICD-10-CM

## 2024-09-12 ENCOUNTER — Ambulatory Visit: Admitting: Cardiology
